# Patient Record
Sex: Female | Born: 1935 | Race: White | Hispanic: No | Smoking: Never smoker
Health system: Southern US, Community
[De-identification: ages and names within clinical notes are randomized; demographics above are authoritative.]

## PROBLEM LIST (undated history)

## (undated) DIAGNOSIS — I219 Acute myocardial infarction, unspecified: Secondary | ICD-10-CM

## (undated) DIAGNOSIS — I10 Essential (primary) hypertension: Secondary | ICD-10-CM

## (undated) DIAGNOSIS — I2510 Atherosclerotic heart disease of native coronary artery without angina pectoris: Secondary | ICD-10-CM

## (undated) DIAGNOSIS — M353 Polymyalgia rheumatica: Secondary | ICD-10-CM

## (undated) DIAGNOSIS — I639 Cerebral infarction, unspecified: Secondary | ICD-10-CM

## (undated) DIAGNOSIS — K589 Irritable bowel syndrome without diarrhea: Secondary | ICD-10-CM

## (undated) DIAGNOSIS — Z8673 Personal history of transient ischemic attack (TIA), and cerebral infarction without residual deficits: Secondary | ICD-10-CM

## (undated) DIAGNOSIS — J984 Other disorders of lung: Secondary | ICD-10-CM

## (undated) DIAGNOSIS — G8929 Other chronic pain: Secondary | ICD-10-CM

## (undated) DIAGNOSIS — M810 Age-related osteoporosis without current pathological fracture: Secondary | ICD-10-CM

## (undated) DIAGNOSIS — N183 Chronic kidney disease, stage 3 unspecified: Secondary | ICD-10-CM

## (undated) DIAGNOSIS — C4490 Unspecified malignant neoplasm of skin, unspecified: Secondary | ICD-10-CM

## (undated) DIAGNOSIS — B029 Zoster without complications: Secondary | ICD-10-CM

## (undated) DIAGNOSIS — I6529 Occlusion and stenosis of unspecified carotid artery: Secondary | ICD-10-CM

## (undated) DIAGNOSIS — D649 Anemia, unspecified: Secondary | ICD-10-CM

## (undated) DIAGNOSIS — M1710 Unilateral primary osteoarthritis, unspecified knee: Secondary | ICD-10-CM

## (undated) DIAGNOSIS — C801 Malignant (primary) neoplasm, unspecified: Secondary | ICD-10-CM

## (undated) DIAGNOSIS — M179 Osteoarthritis of knee, unspecified: Secondary | ICD-10-CM

## (undated) DIAGNOSIS — M549 Dorsalgia, unspecified: Secondary | ICD-10-CM

## (undated) HISTORY — DX: Age-related osteoporosis without current pathological fracture: M81.0

## (undated) HISTORY — PX: KNEE ARTHROSCOPY: SHX127

## (undated) HISTORY — DX: Osteoarthritis of knee, unspecified: M17.9

## (undated) HISTORY — DX: Malignant (primary) neoplasm, unspecified: C80.1

## (undated) HISTORY — PX: KNEE ARTHROPLASTY: SHX992

## (undated) HISTORY — PX: JOINT REPLACEMENT: SHX530

## (undated) HISTORY — DX: Other chronic pain: G89.29

## (undated) HISTORY — DX: Occlusion and stenosis of unspecified carotid artery: I65.29

## (undated) HISTORY — PX: LUMBAR LAMINECTOMY: SHX95

## (undated) HISTORY — DX: Cerebral infarction, unspecified: I63.9

## (undated) HISTORY — PX: CARDIAC CATHETERIZATION: SHX172

## (undated) HISTORY — DX: Personal history of transient ischemic attack (TIA), and cerebral infarction without residual deficits: Z86.73

## (undated) HISTORY — DX: Essential (primary) hypertension: I10

## (undated) HISTORY — DX: Other disorders of lung: J98.4

## (undated) HISTORY — DX: Polymyalgia rheumatica: M35.3

## (undated) HISTORY — DX: Unspecified malignant neoplasm of skin, unspecified: C44.90

## (undated) HISTORY — DX: Dorsalgia, unspecified: M54.9

## (undated) HISTORY — DX: Zoster without complications: B02.9

## (undated) HISTORY — DX: Unilateral primary osteoarthritis, unspecified knee: M17.10

## (undated) HISTORY — DX: Irritable bowel syndrome, unspecified: K58.9

## (undated) HISTORY — PX: OTHER SURGICAL HISTORY: SHX169

## (undated) HISTORY — DX: Atherosclerotic heart disease of native coronary artery without angina pectoris: I25.10

## (undated) HISTORY — DX: Anemia, unspecified: D64.9

---
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
## 1975-04-23 HISTORY — PX: ABDOMINAL HYSTERECTOMY: SHX81

## 1980-04-22 HISTORY — PX: BACK SURGERY: SHX140

## 1999-04-12 ENCOUNTER — Encounter: Payer: Self-pay | Admitting: Orthopedic Surgery

## 1999-04-18 ENCOUNTER — Encounter: Payer: Self-pay | Admitting: Orthopedic Surgery

## 1999-04-18 ENCOUNTER — Inpatient Hospital Stay (HOSPITAL_COMMUNITY): Admission: RE | Admit: 1999-04-18 | Discharge: 1999-04-24 | Payer: Self-pay | Admitting: Orthopedic Surgery

## 1999-04-24 ENCOUNTER — Inpatient Hospital Stay (HOSPITAL_COMMUNITY)
Admission: RE | Admit: 1999-04-24 | Discharge: 1999-04-28 | Payer: Self-pay | Admitting: Physical Medicine & Rehabilitation

## 1999-06-13 ENCOUNTER — Inpatient Hospital Stay (HOSPITAL_COMMUNITY): Admission: RE | Admit: 1999-06-13 | Discharge: 1999-06-17 | Payer: Self-pay | Admitting: Orthopedic Surgery

## 1999-09-06 ENCOUNTER — Encounter: Payer: Self-pay | Admitting: Obstetrics and Gynecology

## 1999-09-06 ENCOUNTER — Encounter: Admission: RE | Admit: 1999-09-06 | Discharge: 1999-09-06 | Payer: Self-pay | Admitting: Obstetrics and Gynecology

## 2000-07-29 ENCOUNTER — Encounter (HOSPITAL_COMMUNITY): Admission: RE | Admit: 2000-07-29 | Discharge: 2000-08-28 | Payer: Self-pay | Admitting: Orthopedic Surgery

## 2000-09-16 ENCOUNTER — Encounter: Payer: Self-pay | Admitting: Obstetrics and Gynecology

## 2000-09-16 ENCOUNTER — Encounter: Admission: RE | Admit: 2000-09-16 | Discharge: 2000-09-16 | Payer: Self-pay | Admitting: Obstetrics and Gynecology

## 2001-10-06 ENCOUNTER — Encounter: Admission: RE | Admit: 2001-10-06 | Discharge: 2001-10-06 | Payer: Self-pay | Admitting: Obstetrics and Gynecology

## 2001-10-06 ENCOUNTER — Encounter: Payer: Self-pay | Admitting: Obstetrics and Gynecology

## 2001-11-26 ENCOUNTER — Inpatient Hospital Stay (HOSPITAL_COMMUNITY): Admission: EM | Admit: 2001-11-26 | Discharge: 2001-11-27 | Payer: Self-pay | Admitting: *Deleted

## 2002-02-01 ENCOUNTER — Encounter: Payer: Self-pay | Admitting: Family Medicine

## 2002-02-01 ENCOUNTER — Ambulatory Visit (HOSPITAL_COMMUNITY): Admission: RE | Admit: 2002-02-01 | Discharge: 2002-02-01 | Payer: Self-pay | Admitting: Family Medicine

## 2002-11-30 ENCOUNTER — Encounter: Payer: Self-pay | Admitting: Obstetrics and Gynecology

## 2002-11-30 ENCOUNTER — Encounter: Admission: RE | Admit: 2002-11-30 | Discharge: 2002-11-30 | Payer: Self-pay | Admitting: Obstetrics and Gynecology

## 2003-04-19 ENCOUNTER — Ambulatory Visit (HOSPITAL_COMMUNITY): Admission: RE | Admit: 2003-04-19 | Discharge: 2003-04-19 | Payer: Self-pay | Admitting: Orthopedic Surgery

## 2003-05-10 ENCOUNTER — Encounter (HOSPITAL_COMMUNITY): Admission: RE | Admit: 2003-05-10 | Discharge: 2003-06-09 | Payer: Self-pay | Admitting: Orthopedic Surgery

## 2003-06-13 ENCOUNTER — Encounter (HOSPITAL_COMMUNITY): Admission: RE | Admit: 2003-06-13 | Discharge: 2003-07-13 | Payer: Self-pay | Admitting: Orthopedic Surgery

## 2003-10-05 ENCOUNTER — Ambulatory Visit (HOSPITAL_COMMUNITY): Admission: RE | Admit: 2003-10-05 | Discharge: 2003-10-05 | Payer: Self-pay | Admitting: Family Medicine

## 2003-12-01 ENCOUNTER — Ambulatory Visit (HOSPITAL_COMMUNITY): Admission: RE | Admit: 2003-12-01 | Discharge: 2003-12-01 | Payer: Self-pay | Admitting: Obstetrics and Gynecology

## 2004-02-10 ENCOUNTER — Encounter (HOSPITAL_COMMUNITY)
Admission: RE | Admit: 2004-02-10 | Discharge: 2004-03-11 | Payer: Self-pay | Admitting: Physical Medicine and Rehabilitation

## 2004-11-22 ENCOUNTER — Ambulatory Visit (HOSPITAL_COMMUNITY): Admission: RE | Admit: 2004-11-22 | Discharge: 2004-11-22 | Payer: Self-pay | Admitting: Family Medicine

## 2004-12-04 ENCOUNTER — Ambulatory Visit (HOSPITAL_COMMUNITY): Admission: RE | Admit: 2004-12-04 | Discharge: 2004-12-04 | Payer: Self-pay | Admitting: Obstetrics and Gynecology

## 2005-04-22 HISTORY — PX: OTHER SURGICAL HISTORY: SHX169

## 2005-07-31 ENCOUNTER — Encounter: Admission: RE | Admit: 2005-07-31 | Discharge: 2005-07-31 | Payer: Self-pay | Admitting: Orthopedic Surgery

## 2005-12-05 ENCOUNTER — Ambulatory Visit (HOSPITAL_COMMUNITY): Admission: RE | Admit: 2005-12-05 | Discharge: 2005-12-05 | Payer: Self-pay | Admitting: Obstetrics and Gynecology

## 2005-12-17 ENCOUNTER — Encounter: Admission: RE | Admit: 2005-12-17 | Discharge: 2005-12-17 | Payer: Self-pay | Admitting: Orthopedic Surgery

## 2006-01-08 ENCOUNTER — Ambulatory Visit: Payer: Self-pay | Admitting: Cardiology

## 2006-01-30 ENCOUNTER — Encounter (HOSPITAL_COMMUNITY): Admission: RE | Admit: 2006-01-30 | Discharge: 2006-03-01 | Payer: Self-pay | Admitting: Cardiology

## 2006-02-05 ENCOUNTER — Ambulatory Visit: Payer: Self-pay | Admitting: Cardiology

## 2006-02-18 ENCOUNTER — Encounter: Admission: RE | Admit: 2006-02-18 | Discharge: 2006-05-19 | Payer: Self-pay | Admitting: Cardiology

## 2006-03-03 ENCOUNTER — Encounter (HOSPITAL_COMMUNITY): Admission: RE | Admit: 2006-03-03 | Discharge: 2006-04-02 | Payer: Self-pay | Admitting: Cardiology

## 2006-04-04 ENCOUNTER — Encounter (HOSPITAL_COMMUNITY): Admission: RE | Admit: 2006-04-04 | Discharge: 2006-04-21 | Payer: Self-pay | Admitting: Cardiology

## 2006-04-16 ENCOUNTER — Ambulatory Visit: Payer: Self-pay | Admitting: Cardiology

## 2006-06-23 ENCOUNTER — Ambulatory Visit: Payer: Self-pay | Admitting: Cardiology

## 2006-06-25 ENCOUNTER — Encounter (HOSPITAL_COMMUNITY): Admission: RE | Admit: 2006-06-25 | Discharge: 2006-07-25 | Payer: Self-pay | Admitting: Cardiology

## 2006-08-27 ENCOUNTER — Encounter: Admission: RE | Admit: 2006-08-27 | Discharge: 2006-08-27 | Payer: Self-pay | Admitting: Orthopedic Surgery

## 2006-09-05 ENCOUNTER — Encounter (HOSPITAL_COMMUNITY): Admission: RE | Admit: 2006-09-05 | Discharge: 2006-10-05 | Payer: Self-pay | Admitting: Cardiology

## 2006-09-05 ENCOUNTER — Ambulatory Visit: Payer: Self-pay | Admitting: Cardiology

## 2006-09-11 ENCOUNTER — Ambulatory Visit: Payer: Self-pay | Admitting: Cardiology

## 2006-12-09 ENCOUNTER — Encounter: Admission: RE | Admit: 2006-12-09 | Discharge: 2006-12-09 | Payer: Self-pay | Admitting: Obstetrics and Gynecology

## 2006-12-23 ENCOUNTER — Ambulatory Visit: Payer: Self-pay | Admitting: Cardiology

## 2007-05-05 ENCOUNTER — Encounter (HOSPITAL_COMMUNITY): Admission: RE | Admit: 2007-05-05 | Discharge: 2007-06-04 | Payer: Self-pay | Admitting: Orthopedic Surgery

## 2007-06-05 ENCOUNTER — Encounter (HOSPITAL_COMMUNITY): Admission: RE | Admit: 2007-06-05 | Discharge: 2007-07-05 | Payer: Self-pay | Admitting: Orthopedic Surgery

## 2007-06-24 ENCOUNTER — Ambulatory Visit: Payer: Self-pay | Admitting: Cardiology

## 2007-08-17 ENCOUNTER — Emergency Department (HOSPITAL_COMMUNITY): Admission: EM | Admit: 2007-08-17 | Discharge: 2007-08-17 | Payer: Self-pay | Admitting: Emergency Medicine

## 2007-12-15 ENCOUNTER — Encounter: Admission: RE | Admit: 2007-12-15 | Discharge: 2007-12-15 | Payer: Self-pay | Admitting: Obstetrics and Gynecology

## 2007-12-23 ENCOUNTER — Ambulatory Visit: Payer: Self-pay | Admitting: Cardiology

## 2008-04-21 ENCOUNTER — Ambulatory Visit: Payer: Self-pay | Admitting: Cardiology

## 2008-04-21 ENCOUNTER — Encounter (INDEPENDENT_AMBULATORY_CARE_PROVIDER_SITE_OTHER): Payer: Self-pay | Admitting: Emergency Medicine

## 2008-04-21 ENCOUNTER — Observation Stay (HOSPITAL_COMMUNITY): Admission: EM | Admit: 2008-04-21 | Discharge: 2008-04-22 | Payer: Self-pay | Admitting: Emergency Medicine

## 2008-04-22 HISTORY — PX: PR VEIN BYPASS GRAFT,AORTO-FEM-POP: 35551

## 2008-05-10 ENCOUNTER — Ambulatory Visit: Payer: Self-pay | Admitting: Vascular Surgery

## 2008-05-13 ENCOUNTER — Encounter: Admission: RE | Admit: 2008-05-13 | Discharge: 2008-05-13 | Payer: Self-pay | Admitting: Interventional Radiology

## 2008-07-11 ENCOUNTER — Ambulatory Visit: Payer: Self-pay | Admitting: Cardiology

## 2008-08-03 ENCOUNTER — Inpatient Hospital Stay (HOSPITAL_COMMUNITY): Admission: RE | Admit: 2008-08-03 | Discharge: 2008-08-08 | Payer: Self-pay | Admitting: Orthopedic Surgery

## 2008-08-05 ENCOUNTER — Ambulatory Visit: Payer: Self-pay | Admitting: Vascular Surgery

## 2008-08-05 ENCOUNTER — Encounter (INDEPENDENT_AMBULATORY_CARE_PROVIDER_SITE_OTHER): Payer: Self-pay | Admitting: Orthopedic Surgery

## 2008-09-06 ENCOUNTER — Encounter (HOSPITAL_COMMUNITY): Admission: RE | Admit: 2008-09-06 | Discharge: 2008-10-06 | Payer: Self-pay | Admitting: Orthopedic Surgery

## 2008-09-28 DIAGNOSIS — M353 Polymyalgia rheumatica: Secondary | ICD-10-CM | POA: Insufficient documentation

## 2008-09-28 DIAGNOSIS — R079 Chest pain, unspecified: Secondary | ICD-10-CM

## 2008-09-28 DIAGNOSIS — I10 Essential (primary) hypertension: Secondary | ICD-10-CM | POA: Insufficient documentation

## 2008-09-28 DIAGNOSIS — J984 Other disorders of lung: Secondary | ICD-10-CM | POA: Insufficient documentation

## 2008-09-28 DIAGNOSIS — M1710 Unilateral primary osteoarthritis, unspecified knee: Secondary | ICD-10-CM

## 2008-09-28 DIAGNOSIS — K589 Irritable bowel syndrome without diarrhea: Secondary | ICD-10-CM

## 2008-10-04 ENCOUNTER — Ambulatory Visit: Payer: Self-pay | Admitting: Cardiology

## 2008-10-14 ENCOUNTER — Encounter (HOSPITAL_COMMUNITY): Admission: RE | Admit: 2008-10-14 | Discharge: 2008-11-13 | Payer: Self-pay | Admitting: Orthopedic Surgery

## 2009-01-10 ENCOUNTER — Encounter: Payer: Self-pay | Admitting: Cardiology

## 2009-01-30 ENCOUNTER — Ambulatory Visit: Payer: Self-pay | Admitting: Cardiology

## 2009-01-30 ENCOUNTER — Observation Stay (HOSPITAL_COMMUNITY): Admission: EM | Admit: 2009-01-30 | Discharge: 2009-01-31 | Payer: Self-pay | Admitting: Emergency Medicine

## 2009-01-30 ENCOUNTER — Telehealth (INDEPENDENT_AMBULATORY_CARE_PROVIDER_SITE_OTHER): Payer: Self-pay | Admitting: *Deleted

## 2009-01-30 LAB — CONVERTED CEMR LAB
ALT: 14 units/L
AST: 19 units/L
Albumin: 3.5 g/dL
Alkaline Phosphatase: 60 units/L
CK-MB: 1.6 ng/mL
CO2: 29 meq/L
Chloride: 107 meq/L
Glucose, Bld: 95 mg/dL
Hemoglobin: 11.8 g/dL
Platelets: 264 10*3/uL
Total Protein: 6.6 g/dL
WBC: 4.8 10*3/uL

## 2009-02-16 ENCOUNTER — Ambulatory Visit: Payer: Self-pay | Admitting: Cardiology

## 2009-05-05 ENCOUNTER — Ambulatory Visit: Payer: Self-pay | Admitting: Vascular Surgery

## 2009-05-05 ENCOUNTER — Encounter: Payer: Self-pay | Admitting: Cardiology

## 2009-07-18 ENCOUNTER — Encounter: Admission: RE | Admit: 2009-07-18 | Discharge: 2009-07-18 | Payer: Self-pay | Admitting: Obstetrics and Gynecology

## 2009-11-07 ENCOUNTER — Ambulatory Visit: Payer: Self-pay | Admitting: Cardiology

## 2009-11-07 DIAGNOSIS — I252 Old myocardial infarction: Secondary | ICD-10-CM | POA: Insufficient documentation

## 2009-11-07 DIAGNOSIS — I2510 Atherosclerotic heart disease of native coronary artery without angina pectoris: Secondary | ICD-10-CM

## 2010-01-26 ENCOUNTER — Telehealth (INDEPENDENT_AMBULATORY_CARE_PROVIDER_SITE_OTHER): Payer: Self-pay

## 2010-04-17 ENCOUNTER — Ambulatory Visit: Payer: Self-pay | Admitting: Cardiology

## 2010-05-22 NOTE — Progress Notes (Signed)
**Note De-Identified Brenda Hall Obfuscation** Summary: Patient having problems  Phone Note Call from Patient   Caller: Patient Reason for Call: Talk to Nurse, Privacy/Consent Authorization Summary of Call: pt states that this AM she had some chest pain / pt got out of bed and stood up and it went away / states she went back to bed and it started to hurt again / states that she got up and took 2 ASA's / states that they relieved pain in about 30 minutes / pt states the BP was checked and it was 90/64 / states she hasn't had any more pain, just wanted to see if she should come in to office /tg Initial call taken by: Raechel Ache [facility],  January 26, 2010 11:32 AM  Follow-up for Phone Call        Brenda Hall states that CP did not radiate, she had no nausea, sweating, sob, etc. Pt. states that her husband had appt. this morning with Dr. Fredrich Birks and she asked if they would check her BP which was 90/64. Pt. states she has had no chest pain since taking 2 tablets of 325mg  ASA @ 0700 and that she did not have to take NTG. Pt. wants to know if she needs appt. to be checked? Please advise. Follow-up by: Larita Fife Keon Pender LPN,  January 26, 2010 1:17 PM     Appended Document: Patient having problems no need for office visit.....may be acid reflux.  Reviewed Juanito Doom, MD  Appended Document: Patient having problems Pt. advised.

## 2010-05-22 NOTE — Letter (Signed)
Summary: [facility]   [facility]   Imported By: Roderic Ovens 06/14/2009 09:42:58  _____________________________________________________________________  External Attachment:    Type:   Image     Comment:   External Document

## 2010-05-22 NOTE — Assessment & Plan Note (Signed)
Summary: 6 mth f/u per checkout on 02/16/09/tg   Primary Provider:  Dr.Stephen Gerda Diss   History of Present Illness: Mrs Burling returns for E and M of her history of CAD.  Since her admission in October 2010, she has had no recurrent CP. Her major c/o is she is taking too many pills. I have reviewed these today with her and advised stopping her B12, MVit, Zinc. She is taking both Aggrenox and ASA but complains of easy bruising. She would like to stop the ASA.  No complaint of angina, DOE or other anginal equivalent.  Current Medications (verified): 1)  Lisinopril 10 Mg Tabs (Lisinopril) .... Take One Tablet By Mouth Daily 2)  Metoprolol Tartrate 25 Mg Tabs (Metoprolol Tartrate) .Marland Kitchen.. 1 By Mouth Two Times A Day 3)  Celebrex 200 Mg Caps (Celecoxib) .Marland Kitchen.. 1 Tablet Daily 4)  Detrol La 4 Mg Xr24h-Cap (Tolterodine Tartrate) 5)  Calcium-Vitamin D 600-200 Mg-Unit Tabs (Calcium-Vitamin D) .Marland Kitchen.. 1 Tablet Daily 6)  Glucosamine-Chondroitin  Caps (Glucosamine-Chondroit-Vit C-Mn) .Marland Kitchen.. 1 Tablet Daily 7)  Aggrenox 25-200 Mg Xr12h-Cap (Aspirin-Dipyridamole) .... Take One Capsule By Mouth Twice A Day 8)  Fosamax 70 Mg Tabs (Alendronate Sodium) .Marland Kitchen.. 1 Tablet Wkly 9)  Omeprazole 20 Mg Tbec (Omeprazole) .... Daily As Needed 10)  Flonase 50 Mcg/act Susp (Fluticasone Propionate) .... Daily As Needed 11)  Lipitor 40 Mg Tabs (Atorvastatin Calcium) .... Take One Tablet By Mouth Daily. 12)  Pantoprazole Sodium 40 Mg Tbec (Pantoprazole Sodium) .... Take 1 Tab Daily 13)  Premarin 0.3 Mg Tabs (Estrogens Conjugated) .Marland Kitchen.. 1 By Mouth Daily As Directed 14)  Lomotil 2.5-0.025 Mg Tabs (Diphenoxylate-Atropine) .... As Needed 15)  Vitamin D 2000 Unit Tabs (Cholecalciferol) .Marland Kitchen.. 1 Tablet By Mouth Once Daily  Allergies (verified): 1)  ! Plavix (Clopidogrel Bisulfate)  Review of Systems       negative other than HPI.  Vital Signs:  Patient profile:   77 year old female Height:      65 inches Weight:      175  pounds BMI:     29.23 Pulse rate:   79 / minute Resp:     16 per minute BP sitting:   108 / 62  (left arm)  Vitals Entered By: Marrion Coy, CNA (November 07, 2009 1:32 PM)  Physical Exam  General:  obese.   Head:  normocephalic and atraumatic Eyes:  GLASSES OTHERWISE UNREMARKABLE Neck:  Neck supple, no JVD. No masses, thyromegaly or abnormal cervical nodes. Chest Aleksandra Raben:  no deformities or breast masses noted Lungs:  Clear bilaterally to auscultation and percussion. Heart:  Non-displaced PMI, chest non-tender; regular rate and rhythm, S1, S2 without murmurs, rubs or gallops. Carotid upstroke normal, no bruit. Normal abdominal aortic size, no bruits. Femorals normal pulses, no bruits. Pedals normal pulses. No edema, no varicosities. Abdomen:  Bowel sounds positive; abdomen soft and non-tender without masses, organomegaly, or hernias noted. No hepatosplenomegaly. Msk:  decreased ROM.   Pulses:  NO DP in R foot, otherwise normal. Extremities:  No clubbing or cyanosis. Neurologic:  Alert and oriented x 3. Skin:  Intact without lesions or rashes. Psych:  Normal affect.   Problems:  Medical Problems Added: 1)  Dx of Old Myocardial Infarction  (ICD-412) 2)  Dx of Cad, Native Vessel  (ICD-414.01)  EKG  Procedure date:  11/07/2009  Findings:      NSR, Nl EKG  Impression & Recommendations:  Problem # 1:  CAD, NATIVE VESSEL (ICD-414.01) Assessment Unchanged With increased bruising will stop  asa. Continue Aggrenox. The following medications were removed from the medication list:    Aspir-low 81 Mg Tbec (Aspirin) .Marland Kitchen... Take 1 tab daily Her updated medication list for this problem includes:    Lisinopril 10 Mg Tabs (Lisinopril) .Marland Kitchen... Take one tablet by mouth daily    Metoprolol Tartrate 25 Mg Tabs (Metoprolol tartrate) .Marland Kitchen... 1 by mouth two times a day    Aggrenox 25-200 Mg Xr12h-cap (Aspirin-dipyridamole) .Marland Kitchen... Take one capsule by mouth twice a day  Problem # 2:  HYPERTENSION  (ICD-401.9) Assessment: Improved  The following medications were removed from the medication list:    Aspir-low 81 Mg Tbec (Aspirin) .Marland Kitchen... Take 1 tab daily Her updated medication list for this problem includes:    Lisinopril 10 Mg Tabs (Lisinopril) .Marland Kitchen... Take one tablet by mouth daily    Metoprolol Tartrate 25 Mg Tabs (Metoprolol tartrate) .Marland Kitchen... 1 by mouth two times a day  Problem # 3:  OLD MYOCARDIAL INFARCTION (ICD-412) Assessment: Unchanged  The following medications were removed from the medication list:    Aspir-low 81 Mg Tbec (Aspirin) .Marland Kitchen... Take 1 tab daily Her updated medication list for this problem includes:    Lisinopril 10 Mg Tabs (Lisinopril) .Marland Kitchen... Take one tablet by mouth daily    Metoprolol Tartrate 25 Mg Tabs (Metoprolol tartrate) .Marland Kitchen... 1 by mouth two times a day    Aggrenox 25-200 Mg Xr12h-cap (Aspirin-dipyridamole) .Marland Kitchen... Take one capsule by mouth twice a day  Patient Instructions: 1)  Your physician recommends that you schedule a follow-up appointment in: 6 months 2)  Your physician has recommended you make the following change in your medication: Stop taking Aspirin, Vitamin B-12, Zink and Multiple Vitamin.

## 2010-05-24 NOTE — Assessment & Plan Note (Signed)
Summary: 6 mth fu /sn   Visit Type:  Follow-up Primary Provider:  Dr.Stephen Gerda Diss  CC:  no cardiology complaints.  History of Present Illness: Brenda Hall Nine times a day for followup of her coronary artery disease.  She's having no angina or ischemic symptoms. She's been having a lot of problems with her niece and arthritic pain.  Because of a problem with her kidneys, she had come off of Celebrex. She is really now having a lot of problems getting around.  She is very compliant with her medications. She will to go back on aspirin full time. She thinks it helps her arthritis and also her nondescriptive chest pain. She remains on Aggrenox. She wanted to stop at last visit because of easy bruisability.  She denies any reflux symptoms, hemoptysis or hematemesis, or melena.  She is having no symptoms of TIAs or mini strokes. She is nonobstructive carotid disease.  Clinical Reports Reviewed:  Carotid Doppler:  04/21/2008:  IMPRESSION:    Plaque formation bilaterally at carotid bifurcations, greater on   left.   Mildly elevated peak systolic velocities in the proximal ICA   bilaterally, both falling within the 50-69% diameter stenosis   range, though by gray scale imaging and color Doppler imaging the   plaque at the proximal right ICA appears only minimal in severity   and doubt this degree of stenosis.   Proximal right ECA stenosis present, however.   Current Medications (verified): 1)  Lisinopril 10 Mg Tabs (Lisinopril) .... Take One Tablet By Mouth Daily 2)  Metoprolol Tartrate 25 Mg Tabs (Metoprolol Tartrate) .Marland Kitchen.. 1 By Mouth Two Times A Day 3)  Detrol La 4 Mg Xr24h-Cap (Tolterodine Tartrate) .... Take 1 Tab Daily 4)  Calcium-Vitamin D 600-200 Mg-Unit Tabs (Calcium-Vitamin D) .Marland Kitchen.. 1 Tablet Daily 5)  Glucosamine-Chondroitin  Caps (Glucosamine-Chondroit-Vit C-Mn) .Marland Kitchen.. 1 Tablet Daily 6)  Aggrenox 25-200 Mg Xr12h-Cap (Aspirin-Dipyridamole) .... Take One Capsule By Mouth Twice  A Day 7)  Fosamax 70 Mg Tabs (Alendronate Sodium) .Marland Kitchen.. 1 Tablet Wkly 8)  Flonase 50 Mcg/act Susp (Fluticasone Propionate) .... Daily As Needed 9)  Lipitor 40 Mg Tabs (Atorvastatin Calcium) .... Take One Tablet By Mouth Daily. 10)  Pantoprazole Sodium 40 Mg Tbec (Pantoprazole Sodium) .... Take 1 Tab Daily 11)  Premarin 0.3 Mg Tabs (Estrogens Conjugated) .Marland Kitchen.. 1 By Mouth Daily As Directed 12)  Lomotil 2.5-0.025 Mg Tabs (Diphenoxylate-Atropine) .... As Needed 13)  Vitamin D 2000 Unit Tabs (Cholecalciferol) .Marland Kitchen.. 1 Tablet By Mouth Once Daily 14)  Artro Silium .... Take 1 Tab Daily 15)  Aspirin 325 Mg Tabs (Aspirin) .... Take 1 Tab Daily  Allergies (verified): 1)  ! Plavix (Clopidogrel Bisulfate)  Comments:  Nurse/Medical Assistant: patient states she is to see Dr.Tananbaum next week per Dr.Devashar due to high level in kidney doesn't remember what kind of level patient uses Tourist information centre manager.  Past History:  Past Medical History: Last updated: 10-19-2008 Current Problems:  CAD (ICD-414.00) LUNG NODULE (ICD-518.89) DEGENERATIVE JOINT DISEASE, KNEE (ICD-715.96) IRRITABLE BOWEL SYNDROME (ICD-564.1) POLYMYALGIA RHEUMATICA (ICD-725) HYPERTENSION (ICD-401.9) CHEST PAIN UNSPECIFIED (ICD-786.50)  Past Surgical History: Last updated: 19-Oct-2008 Lumbar Laminectomy removal of Melanoma from back 2007 Knee Arthroscopy Knee Arthroplasty-Total  Family History: Last updated: 2008-10-19 Father:deceased 65 yo [place] Mother:DECEASED 26 yo uknw Siblings:sister 1 living   Social History: Last updated: 10-19-08 Retired  Married  children:3 Regular Exercise - yes hobbies swimming Tobacco Use - No.  Alcohol Use - no Drug Use - no  Risk Factors: Exercise: yes (10-19-08)  Risk Factors: Smoking Status: never (09/28/2008)  Review of Systems       negative other than history of present illness  Vital Signs:  Patient profile:   77 year old female Weight:      174 pounds BMI:      29.06 O2 Sat:      98 % on Room air Pulse rate:   65 / minute BP sitting:   149 / 68  (right arm)  Vitals Entered By: Dreama Saa, CNA (April 17, 2010 2:36 PM)  O2 Flow:  Room air  Physical Exam  General:  no acute distress,obese.   Head:  normocephalic and atraumatic Eyes:  PERRLA/EOM intact; conjunctiva and lids normal. Neck:  Neck supple, no JVD. No masses, thyromegaly or abnormal cervical nodes. Chest Wall:  no deformities or breast masses noted Lungs:  Clear bilaterally to auscultation and percussion. Heart:  PMI nondisplaced, regular rate and rhythm, normal S1-S2, no obvious carotid bruits Abdomen:  soft, good bowel sounds, no tenderness Msk:  decreased ROM.   Pulses:  pulses normal in all 4 extremities Extremities:  No clubbing or cyanosis. Neurologic:  Alert and oriented x 3. Skin:  Intact without lesions or rashes. Psych:  Normal affect.   Impression & Recommendations:  Problem # 1:  CAD, NATIVE VESSEL (ICD-414.01) Assessment Unchanged  Her updated medication list for this problem includes:    Lisinopril 10 Mg Tabs (Lisinopril) .Marland Kitchen... Take one tablet by mouth daily    Metoprolol Tartrate 25 Mg Tabs (Metoprolol tartrate) .Marland Kitchen... 1 by mouth two times a day    Aggrenox 25-200 Mg Xr12h-cap (Aspirin-dipyridamole) .Marland Kitchen... Take one capsule by mouth twice a day    Aspirin 325 Mg Tabs (Aspirin) .Marland Kitchen... Take 1 tab daily  Problem # 2:  OLD MYOCARDIAL INFARCTION (ICD-412) Assessment: Unchanged  Her updated medication list for this problem includes:    Lisinopril 10 Mg Tabs (Lisinopril) .Marland Kitchen... Take one tablet by mouth daily    Metoprolol Tartrate 25 Mg Tabs (Metoprolol tartrate) .Marland Kitchen... 1 by mouth two times a day    Aggrenox 25-200 Mg Xr12h-cap (Aspirin-dipyridamole) .Marland Kitchen... Take one capsule by mouth twice a day    Aspirin 325 Mg Tabs (Aspirin) .Marland Kitchen... Take 1 tab daily  Problem # 3:  HYPERTENSION (ICD-401.9) Assessment: Improved  Her updated medication list for this problem  includes:    Lisinopril 10 Mg Tabs (Lisinopril) .Marland Kitchen... Take one tablet by mouth daily    Metoprolol Tartrate 25 Mg Tabs (Metoprolol tartrate) .Marland Kitchen... 1 by mouth two times a day    Aspirin 325 Mg Tabs (Aspirin) .Marland Kitchen... Take 1 tab daily  Problem # 4:  CHEST PAIN UNSPECIFIED (ICD-786.50) Assessment: Unchanged I reassured her this is noncardiac. No further workup at this time. Her updated medication list for this problem includes:    Lisinopril 10 Mg Tabs (Lisinopril) .Marland Kitchen... Take one tablet by mouth daily    Metoprolol Tartrate 25 Mg Tabs (Metoprolol tartrate) .Marland Kitchen... 1 by mouth two times a day    Aggrenox 25-200 Mg Xr12h-cap (Aspirin-dipyridamole) .Marland Kitchen... Take one capsule by mouth twice a day    Aspirin 325 Mg Tabs (Aspirin) .Marland Kitchen... Take 1 tab daily  Patient Instructions: 1)  Your physician recommends that you schedule a follow-up appointment in: 6 months 2)  Your physician recommends that you continue on your current medications as directed. Please refer to the Current Medication list given to you today.

## 2010-07-26 LAB — CBC
MCV: 94.8 fL (ref 78.0–100.0)
RBC: 3.65 MIL/uL — ABNORMAL LOW (ref 3.87–5.11)
RDW: 12.7 % (ref 11.5–15.5)
WBC: 4.8 10*3/uL (ref 4.0–10.5)

## 2010-07-26 LAB — COMPREHENSIVE METABOLIC PANEL
AST: 19 U/L (ref 0–37)
Albumin: 3.5 g/dL (ref 3.5–5.2)
Alkaline Phosphatase: 60 U/L (ref 39–117)
Chloride: 107 mEq/L (ref 96–112)
GFR calc Af Amer: >60 mL/min (ref >60)

## 2010-07-26 LAB — DIFFERENTIAL
Basophils Absolute: 0 10*3/uL (ref 0.0–0.1)
Basophils Relative: 1 % (ref 0–1)
Eosinophils Absolute: 0.2 10*3/uL (ref 0.0–0.7)
Eosinophils Relative: 5 % (ref 0–5)
Lymphocytes Relative: 38 % (ref 12–46)
Lymphs Abs: 1.8 10*3/uL (ref 0.7–4.0)
Neutro Abs: 2.3 10*3/uL (ref 1.7–7.7)

## 2010-07-26 LAB — POCT CARDIAC MARKERS
CKMB, poc: 1.3 ng/mL (ref 1.0–8.0)
CKMB, poc: <1 ng/mL — ABNORMAL LOW (ref 1.0–8.0)
Myoglobin, poc: 85.4 ng/mL (ref 12–200)
Troponin i, poc: <0.05 ng/mL (ref 0.00–0.09)

## 2010-07-26 LAB — CARDIAC PANEL(CRET KIN+CKTOT+MB+TROPI)
Relative Index: INVALID (ref 0.0–2.5)
Total CK: 61 U/L (ref 7–177)
Troponin I: 0.02 ng/mL (ref 0.00–0.06)

## 2010-08-01 LAB — DIFFERENTIAL
Basophils Absolute: 0 10*3/uL (ref 0.0–0.1)
Basophils Relative: 1 % (ref 0–1)
Eosinophils Relative: 2 % (ref 0–5)
Lymphocytes Relative: 33 % (ref 12–46)
Monocytes Absolute: 0.4 10*3/uL (ref 0.1–1.0)
Neutro Abs: 3.4 10*3/uL (ref 1.7–7.7)

## 2010-08-01 LAB — CBC
HCT: 25.2 % — ABNORMAL LOW (ref 36.0–46.0)
HCT: 30.7 % — ABNORMAL LOW (ref 36.0–46.0)
HCT: 36 % (ref 36.0–46.0)
Hemoglobin: 8.8 g/dL — ABNORMAL LOW (ref 12.0–15.0)
Hemoglobin: 10 g/dL — ABNORMAL LOW (ref 12.0–15.0)
Hemoglobin: 12.3 g/dL (ref 12.0–15.0)
MCHC: 34 g/dL (ref 30.0–36.0)
MCHC: 34.8 g/dL (ref 30.0–36.0)
MCHC: 35 g/dL (ref 30.0–36.0)
MCHC: 34.1 g/dL (ref 30.0–36.0)
MCV: 93.6 fL (ref 78.0–100.0)
MCV: 94.3 fL (ref 78.0–100.0)
MCV: 94.9 fL (ref 78.0–100.0)
MCV: 95.1 fL (ref 78.0–100.0)
Platelets: 207 10*3/uL (ref 150–400)
Platelets: 227 10*3/uL (ref 150–400)
Platelets: 246 10*3/uL (ref 150–400)
RBC: 2.67 MIL/uL — ABNORMAL LOW (ref 3.87–5.11)
RBC: 3.26 MIL/uL — ABNORMAL LOW (ref 3.87–5.11)
RBC: 3.79 MIL/uL — ABNORMAL LOW (ref 3.87–5.11)
RDW: 13.3 % (ref 11.5–15.5)
RDW: 13.9 % (ref 11.5–15.5)
RDW: 13.5 % (ref 11.5–15.5)
WBC: 6 10*3/uL (ref 4.0–10.5)
WBC: 7 10*3/uL (ref 4.0–10.5)
WBC: 6 10*3/uL (ref 4.0–10.5)

## 2010-08-01 LAB — BASIC METABOLIC PANEL
BUN: 13 mg/dL (ref 6–23)
BUN: 14 mg/dL (ref 6–23)
BUN: 13 mg/dL (ref 6–23)
CO2: 26 mEq/L (ref 19–32)
CO2: 27 mEq/L (ref 19–32)
CO2: 28 mEq/L (ref 19–32)
Calcium: 7.8 mg/dL — ABNORMAL LOW (ref 8.4–10.5)
Calcium: 8.6 mg/dL (ref 8.4–10.5)
Calcium: 8.7 mg/dL (ref 8.4–10.5)
Chloride: 100 mEq/L (ref 96–112)
Chloride: 105 mEq/L (ref 96–112)
Chloride: 108 mEq/L (ref 96–112)
Creatinine, Ser: 1.17 mg/dL (ref 0.4–1.2)
Creatinine, Ser: 1.19 mg/dL (ref 0.4–1.2)
Creatinine, Ser: 1.31 mg/dL — ABNORMAL HIGH (ref 0.4–1.2)
Creatinine, Ser: 0.95 mg/dL (ref 0.4–1.2)
GFR calc Af Amer: 55 mL/min — ABNORMAL LOW (ref >60)
GFR calc Af Amer: 58 mL/min — ABNORMAL LOW (ref >60)
GFR calc non Af Amer: 45 mL/min — ABNORMAL LOW (ref >60)
GFR calc non Af Amer: 57 mL/min — ABNORMAL LOW (ref >60)
Glucose, Bld: 113 mg/dL — ABNORMAL HIGH (ref 70–99)
Glucose, Bld: 95 mg/dL (ref 70–99)
Glucose, Bld: 146 mg/dL — ABNORMAL HIGH (ref 70–99)
Potassium: 3.9 mEq/L (ref 3.5–5.1)
Sodium: 135 mEq/L (ref 135–145)

## 2010-08-01 LAB — CROSSMATCH
ABO/RH(D): AB POS
Antibody Screen: NEGATIVE

## 2010-08-01 LAB — TYPE AND SCREEN
ABO/RH(D): AB POS
Antibody Screen: NEGATIVE

## 2010-08-01 LAB — URINE CULTURE: Colony Count: 3000

## 2010-08-01 LAB — APTT: aPTT: 25 seconds (ref 24–37)

## 2010-08-01 LAB — URINALYSIS, ROUTINE W REFLEX MICROSCOPIC
Bilirubin Urine: NEGATIVE
Glucose, UA: NEGATIVE mg/dL
Ketones, ur: NEGATIVE mg/dL
Protein, ur: NEGATIVE mg/dL
pH: 5 (ref 5.0–8.0)

## 2010-08-01 LAB — COMPREHENSIVE METABOLIC PANEL
Albumin: 3.7 g/dL (ref 3.5–5.2)
Alkaline Phosphatase: 83 U/L (ref 39–117)
BUN: 22 mg/dL (ref 6–23)
CO2: 26 mEq/L (ref 19–32)
Chloride: 109 mEq/L (ref 96–112)
Creatinine, Ser: 1.12 mg/dL (ref 0.4–1.2)
GFR calc non Af Amer: 47 mL/min — ABNORMAL LOW (ref >60)
Potassium: 4.6 mEq/L (ref 3.5–5.1)
Total Bilirubin: 0.7 mg/dL (ref 0.3–1.2)

## 2010-08-01 LAB — PROTIME-INR: Prothrombin Time: 13.8 seconds (ref 11.6–15.2)

## 2010-08-06 LAB — CBC
Hemoglobin: 12.5 g/dL (ref 12.0–15.0)
RDW: 13.5 % (ref 11.5–15.5)
WBC: 5.7 10*3/uL (ref 4.0–10.5)

## 2010-08-06 LAB — BASIC METABOLIC PANEL
Calcium: 8.8 mg/dL (ref 8.4–10.5)
GFR calc Af Amer: >60 mL/min (ref >60)
GFR calc non Af Amer: 57 mL/min — ABNORMAL LOW (ref >60)
Glucose, Bld: 97 mg/dL (ref 70–99)
Sodium: 140 mEq/L (ref 135–145)

## 2010-08-06 LAB — DIFFERENTIAL
Eosinophils Absolute: 0.1 10*3/uL (ref 0.0–0.7)
Eosinophils Relative: 2 % (ref 0–5)
Lymphocytes Relative: 46 % (ref 12–46)
Lymphs Abs: 2.6 10*3/uL (ref 0.7–4.0)
Monocytes Relative: 9 % (ref 3–12)

## 2010-08-10 ENCOUNTER — Other Ambulatory Visit: Payer: Self-pay

## 2010-08-10 MED ORDER — ASPIRIN-DIPYRIDAMOLE ER 25-200 MG PO CP12: 1.0000 | ORAL_CAPSULE | Freq: Two times a day (BID) | ORAL | Status: DC

## 2010-08-17 ENCOUNTER — Other Ambulatory Visit: Payer: Self-pay | Admitting: Obstetrics and Gynecology

## 2010-08-17 DIAGNOSIS — Z1231 Encounter for screening mammogram for malignant neoplasm of breast: Secondary | ICD-10-CM

## 2010-08-23 ENCOUNTER — Other Ambulatory Visit: Payer: Self-pay | Admitting: Family Medicine

## 2010-08-23 ENCOUNTER — Ambulatory Visit
Admission: RE | Admit: 2010-08-23 | Discharge: 2010-08-23 | Disposition: A | Discharge status: Home / Self Care | Payer: 59 | Source: Ambulatory Visit | Attending: Obstetrics and Gynecology | Admitting: Obstetrics and Gynecology

## 2010-08-23 DIAGNOSIS — Z1231 Encounter for screening mammogram for malignant neoplasm of breast: Secondary | ICD-10-CM

## 2010-09-04 NOTE — Procedures (Signed)
CAROTID DUPLEX EXAM   INDICATION:  Followup of carotid disease.   HISTORY:  Diabetes:  Cardiac:  Yes.  Hypertension:  Yes.  Smoking:  No.  Previous Surgery:  No.  CV History:  Asymptomatic.  Amaurosis Fugax No, Paresthesias No, Hemiparesis No                                       RIGHT             LEFT  Brachial systolic pressure:         Not taken due to melanoma           122  Brachial Doppler waveforms:                           Triphasic  Vertebral direction of flow:        Antegrade         Antegrade  DUPLEX VELOCITIES (cm/sec)  CCA peak systolic                   119               78  ECA peak systolic                   186               76  ICA peak systolic                   80                92  ICA end diastolic                   28                33  PLAQUE MORPHOLOGY:                  Mixed             Intimal  thickening, mixed  PLAQUE AMOUNT:                      Mild              Mild  PLAQUE LOCATION:                    CCA, bifurcation  CCA, bifurcation   IMPRESSION:  1. Right common carotid artery stenosis.  2. Right external carotid artery stenosis.  3. Mild plaque noted bilaterally.   ___________________________________________  Larina Earthly, M.D.   CJ/MEDQ  D:  05/05/2009  T:  05/05/2009  Job:  [id number]

## 2010-09-04 NOTE — Letter (Signed)
Sep 11, 2006    W. Simone Curia, M.D.  [address]  [place], [place] [address]   RE:  Brenda, Hall  MRN:  [id number]  /  DOB:  02-11-1931   Dear Brett Canales:   Ms. Brenda Hall returns to the office following a recent assessment for chest  discomfort.  Fortunately, all testing was negative.  Her cardiac markers  were normal.  Her CBC and TSH were normal.  A stress nuclear study was  negative for ischemia.   She has been taking omeprazole 20 mg daily and has refrained from using  Celebrex.  This has resulted in resolution of her symptoms.   EXAMINATION:  Pleasant, loquacious woman.  The weight is 167, four  pounds more than in March.  Blood pressure 130/70, heart rate 65 and  regular, respirations 16.  NECK:  No jugular venous distention; no carotid bruits.  LUNGS:  Clear.  CARDIAC:  Normal first and second heart sounds; fourth heart sound  present.  ABDOMEN:  Soft and nontender; no organomegaly.   IMPRESSION:  Ms. Brenda Hall is doing well with modification of her medical  therapy.  She was advised that if she needs Celebrex she can take it,  but to use it only on a p.r.n. basis.  She will likewise use omeprazole  on a p.r.n. basis.  She will plan to return to see Dr. Daleen Squibb as  previously scheduled for routine followup.    Sincerely,      Gerrit Friends. Dietrich Pates, MD, [facility]  Electronically Signed    RMR/MedQ  DD: 09/11/2006  DT: 09/11/2006  Job #: [id number]

## 2010-09-04 NOTE — Assessment & Plan Note (Signed)
[facility]                            CARDIOLOGY OFFICE NOTE   NAME:Brenda Hall                  MRN:          [id number]  DATE:06/24/2007                            DOB:          March 04, 1931    Brenda Hall returns today for further management.   PROBLEM LIST:  1. Coronary disease.  She is status post non-Q-wave infarction on      December 30, 2005.  She has a bare metal stent in the circumflex      with residual disease in LAD and right coronary artery.  She has an      EF of 52% with mild apical hypokinesia.  Her last stress Myoview      was in the summer of 2008, which showed EF of 65% with no evidence      of ischemia.  2. Gastroesophageal reflux.  This seems to be worse with taking      Lipitor at night.  I told her to take it earlier in the day.  3. Hyperlipidemia.   She is doing well overall.  She has no complaints of angina or ischemic  symptoms.   CURRENT MEDICATIONS:  1. Lisinopril 10 mg a day.  2. Lipitor 40 mg a day.  3. Premarin 0.0625 daily.  4. Os-Cal, Zinc, B12, B6.  5. Metoprolol 25 mg a day.  6. Aspirin 325 mg a day.  7. Celebrex 200 mg a daily.  8. Detrol daily.   PHYSICAL EXAMINATION:  GENERAL:  She looks younger than stated age,  alert and oriented x3.  Skin is warm and dry.  VITAL SIGNS:  Blood pressure 129/82, pulse 75 and regular, weight is  176.  HEENT:  Normocephalic, atraumatic.  PERRL.  Extraocular is intact.  Sclerae are clear.  Facial symmetry is normal.  NECK:  Carotid upstrokes are equal bilaterally without bruits, no JVD.  Thyroid is not enlarged.  Trachea midline.  LUNGS:  Clear.  HEART:  A nondisplaced PMI.  Normal S1 and S2.  No murmur.  ABDOMEN:  Soft, good bowel sounds.  No midline bruit, no hepatomegaly.  EXTREMITIES:  No cyanosis, clubbing or edema.  Pulses are intact.  NEUROLOGIC:  Intact.   ASSESSMENT/PLAN:  Brenda Hall is doing well.  I have asked her to  decrease her  aspirin to 81 mg a day per her request.  She will take her  Lipitor early in the day to avoid reflux symptoms.  Will plan on seeing  her back in 6 months.     Thomas C. Daleen Squibb, MD, [facility]  Electronically Signed    TCW/MedQ  DD: 06/24/2007  DT: 06/25/2007  Job #: [id number]   cc:   Brenda Hall, M.D.

## 2010-09-04 NOTE — Op Note (Signed)
NAME:  Brenda Hall, Brenda Hall           ACCOUNT NO.:  [id number]   MEDICAL RECORD NO.:  [id number]          PATIENT TYPE:  INP   LOCATION:  5013                         FACILITY:  [facility]   PHYSICIAN:  Dyke Brackett, M.D.    DATE OF BIRTH:  [birth year]   DATE OF PROCEDURE:  DATE OF DISCHARGE:  03/25/2008                               OPERATIVE REPORT   PREOPERATIVE DIAGNOSIS:  Osteoarthritis, left knee.   POSTOPERATIVE DIAGNOSIS:  Osteoarthritis, left knee.   OPERATION:  Left total knee replacement (LCS DePuy standard femur, size  3 tibia, 50-mm bearing size standard patella).   SURGEON:  Dyke Brackett, M.D.   ANESTHESIA:  General.   TOURNIQUET TIME:  1 hour 10 minutes.   DESCRIPTION OF PROCEDURE:  Sterile prep and drape, exsanguination of  legs, placed in 375.  A straight skin incision made and a medial  parapatellar approach to the knee made.  Identification of the most  diseased lateral compartment was made with cutting 2.5 mm below.  This  was followed by an anterior and posterior femoral cut.  Flexion gap  measured at 15 mm with equalization of extension gap with a 4-degree  distal valgus cut of 15 mm in extension as well.  Anterior chamfer cuts  were made, keel hole for the prosthesis followed by preparation of the  tibia with a keel hole cut for a size 3 tibia.  Trial tibia was placed  with a trial femur.  Patella was cut leaving 13 to 14 mm of native  patella with three peg patella placement, reduction carried out trialed  with a 12.5 and 15 mm.  A 50-mm bearing was anatomically aligned and no  undue tension, full extension and good balance of ligaments.  Trial  components removed.  Bony surfaces were irrigated.  Final components  were inserted.  Two batches of cement were made.  Antibiotic  premedicines with a 1.8 g of tobramycin and 1 g of vancomycin total for  two batches.  This was mixed and then the tibia was fixed followed by  the femur and the trial bearing again  placed.  Cement was allowed to  harden.  Trial bearing was removed.  Excess cement was removed from the  knee.  Tourniquet was released.  No undue bleeding was noted.  Closure  was effected with #1 Ethilon.  Hemovac drain was placed exiting  superolaterally, subcutaneous tissues with 2-0 Vicryl.  Taken to  recovery room in stable condition after skin was closed using staple  device.      Dyke Brackett, M.D.  Electronically Signed     WDC/MEDQ  D:  08/03/2008  T:  08/04/2008  Job:  [id number]

## 2010-09-04 NOTE — Consult Note (Signed)
NEW PATIENT CONSULTATION   Brenda Hall, Brenda Hall  DOB:  [birth year]                                       05/10/2008  ZOXWR#:[id number]   The patient is referred for vascular surgery consultation regarding a  recent left brain stroke and possible carotid occlusive disease.  This  77 year old female on January 1 developed heaviness and weakness in her  right arm followed by her right leg with no facial weakness, speech  problems or visual problems.  She was admitted to [facility]  and MRI scan revealed a left posterior frontal deep white matter stroke.  Carotid duplex study was performed which revealed moderate bilateral  carotid occlusive disease and quoted as 60-70% bilaterally.  She had no  history of previous stroke, TIAs, amaurosis fugax, diplopia, blurred  vision or syncope.  She has had almost complete return of function of  her right side stating it is probably 95% normal, some very mild  coordination problems.   PAST MEDICAL HISTORY:  1. Hypertension.  2. Coronary artery disease.  3. Previous metal stent placed in [place], [place], September      2008 following a myocardial infarction.  4. Negative for COPD, congestive heart failure.   PAST SURGICAL HISTORY:  1. Lumbar laminectomy x2.  2. Right knee replacement plus arthroscopy right knee.  3. Arthroscopy of the left knee x2.  4. Removal of melanoma from her back in 2007.   FAMILY HISTORY:  Positive for coronary artery disease.  Father died age  2 from myocardial infarction.  Negative for diabetes and stroke.   SOCIAL HISTORY:  She is married, has three children and is retired.  She  does not use tobacco.  Drinks occasional alcohol.   REVIEW OF SYSTEMS:  Positive for weight gain, urinary frequency,  arthritis, diffuse joint pain, otherwise unremarkable.   ALLERGIES:  Plavix which causes a rash and epinephrine.   MEDICATIONS:  Please see health history form.   PHYSICAL  EXAMINATION:  Vital signs:  Blood pressure is 133/78, heart  rate 79, respirations 14.  General:  She is a healthy-appearing female  in no apparent distress, alert and oriented x3.  Neck:  Supple, 3+  carotid pulses are palpable.  No bruits are audible.  Neurologic:  Exam  is grossly normal.  Chest:  Clear to auscultation.  Cardiovascular:  Regular rhythm.  No murmurs.  Abdomen:  Soft, nontender with no palpable  masses.  3+ femoral, popliteal and dorsalis pedis pulses palpable  bilaterally.   Carotid duplex exam was repeated in the [facility] office today 05/10/2008.  There is mild plaque formation bilaterally which appears quite smooth  and the flow reduction is only approximately 30%.  There is some right  external carotid stenosis.   Although one would suspect that the left carotid is the most likely site  for her recent stroke she has very minimal carotid occlusive disease on  our duplex scan.  I would not recommend left carotid surgery at this  time unless she has further left brain events either transient or  permanent.  She should continue to take either Aggrenox or aspirin daily  as she is doing.  We will see her in 12 months with a followup carotid  duplex exam in our office unless she has any symptoms in the interim.   Quita Skye Hart Rochester, M.D.  Electronically Signed   JDL/MEDQ  D:  05/10/2008  T:  05/11/2008  Job:  [id number]   cc:   Donna Bernard, M.D.  Thomas C. Wall, MD, [facility]

## 2010-09-04 NOTE — Assessment & Plan Note (Signed)
[facility]                       [place] CARDIOLOGY OFFICE NOTE   NAME:Brenda Hall                  MRN:          [id number]  DATE:10/04/2008                            DOB:          [birth year]    Brenda Hall returns today for further management of her coronary  artery disease.   She is status post total left knee replacement by Dr. Madelon Lips in  [place].  She is about 8 weeks out from surgery.  She apparently  needed 2 units of blood.  Postop, her blood pressure was a little bit  low.  Lately has been running about 120 over 60-70.  She is slowly  getting her energy and stamina back.   She denies any angina or ischemic symptoms either postoperatively or  since going home.   She is on,  1. Lisinopril 10 mg per day.  2. Metoprolol 50 mg p.o. b.i.d.  3. Lipitor 4 mg daily.  4. Celebrex 200 mg a day.  5. Detrol 4 mg per day.  6. Calcium 600.  7. Zinc 50 mg per day.  8. Glucosamine and chondroitin.  9. Aggrenox 200/25 b.i.d.  10.Vitamin D 5000 units 1 per week.  11.Alendronate 70 mg per week.   She is now being followed by Dr. Corliss Skains as her primary care physician  in [place].   Her blood pressure today was actually 141/87.  She had rushed into the  office.  She says this is highest that has been.  Heart rate is 81 and  regular, her weight is 170 which has no change.  HEENT is unremarkable  and unchanged.  Neck is supple.  Carotid upstrokes were equal  bilaterally without bruits.  No JVD.  Thyroid is not enlarged.  Trachea  is midline.  Lungs are clear to auscultation and percussion.  Heart  reveals a nondisplaced PMI, normal S1 and S2.  Abdominal exam is soft.  Good bowel sounds.  Extremities exhibit no significant edema.  Pulses  are intact.  Her surgical incision from her left knee replacement is  remarkably healed.   ASSESSMENT AND PLAN:  Brenda Hall is doing well.  She wants to reduce her  medications, though I  think that would be a bad idea.  I have answered  all of her questions which were numerous.  I will see her back in 6  months.     Thomas C. Daleen Squibb, MD, [facility]  Electronically Signed    TCW/MedQ  DD: 10/04/2008  DT: 10/05/2008  Job #: [id number]   cc:   Pollyann Savoy, M.D.

## 2010-09-04 NOTE — Letter (Signed)
Sep 05, 2006    W. Simone Curia, M.D.  [address]  [place], [place] [address]   RE:  Brenda, Hall  MRN:  [id number]  /  DOB:  29-Jan-1931   Dear Brett Canales,   Brenda Hall presented to the office today, unannounced, with chest  discomfort.  She describes a three-day history of virtually constant  pressure in the mid-substernal area that is fairly vague and diffuse and  relatively mild.  There is no radiation.  There are no associated  symptoms.  She has a history of irritable bowel syndrome, but it does  not sound as if she has been thought to have reflux.  She underwent  endoscopic studies in the past with Dr. Karilyn Cota, apparently without  significant pathology identified.  She has taken multiple Rolaids  without benefit, but feels that certain meals over the course of the  past few days have exacerbated her discomfort.  She does not have  nitroglycerin.  She recently received a cortisone injection for pain in  the left knee.   CURRENT MEDICATIONS INCLUDE:  1. Aspirin 81 mg daily.  2. Metoprolol 25 mg daily.  3. Celebrex 200 mg daily.  4. Os-Cal daily.  5. Premarin 0.625 mg daily.  6. Atorvastatin 40 mg daily.   She reports an ALLERGY TO CLOPIDOGREL and was treated with ticlopidine  after her stent was placed.   ON EXAM:  A pleasant, loquacious woman, in no acute distress.  The blood  pressure is 110/80, heart rate 75 and regular, respirations 18.  HEENT:  Anicteric sclerae; normal lids and conjunctivae.  NECK:  No jugular venous distention, no carotid bruits.  ENDOCRINE:  No thyromegaly.  HEMATOPOIETIC:  No adenopathy.  LUNGS:  Clear.  CARDIAC:  Normal first and second heart sounds; fourth heart sound  present; no murmurs, normal PMI.  ABDOMEN:  Soft and nontender.  Normal bowel sounds.  No masses, no  organomegaly.  EXTREMITIES:  No edema.  Distal pulses intact.  PSYCHIATRIC:  Alert and oriented, normal affect.  SKIN:  No significant lesions.   EKG:  Normal  sinus rhythm; right ventricular conduction delay; left  atrial abnormality; no acute abnormalities.   IMPRESSION:  Brenda Hall presents with mild prolonged chest discomfort  that is unlikely to represent myocardial ischemia.  Nonetheless, she  reports this as similar to the symptoms that preceded her prior cardiac  intervention.  I believe that this can be managed expeditiously on an  outpatient basis.  We will obtain laboratory testing to include basic  blood work and CPK-MB and troponin.  Assuming these are negative, we  will proceed with a stress Myoview this afternoon.   If her symptoms are not cardiac, they are more likely GI.  She will hold  Celebrex and start Protonix 40 mg daily.  I will reassess this nice  woman next week.    Sincerely,      Gerrit Friends. Dietrich Pates, MD, [facility]    RMR/MedQ  DD: 09/05/2006  DT: 09/05/2006  Job #: [id number]

## 2010-09-04 NOTE — Assessment & Plan Note (Signed)
OFFICE VISIT   Brenda Hall, Brenda Hall H  DOB:  March 15, 1931                                       05/05/2009  ZOXWR#:[id number]   ATTENDING PHYSICIAN:  Larina Earthly, M.D.   The patient is a 77 year old Caucasian female patient of Dr. Candie Chroman.  She is being seen today for 1 year followup for mild carotid artery  disease.  She does have a prior history of left brain stroke around  04/22/2008.  Her symptoms presented as right-sided weakness that she  felt most essentially resolved within an hour.  However, an MRI was done  which did confirm left posterior frontal deep white matter stroke.  A  carotid duplex had been done at [facility] showing 60%-70%  bilateral carotid artery stenosis.  However, when she saw Dr. Hart Rochester in  consultation 05/10/2008 our office repeated the carotid duplex and the  results showed only mild bilateral carotid artery stenosis in the 20%-  39% range.  She did have some right external carotid artery stenosis.  She has been asymptomatic since her initial stroke.  Her only symptom  that she notices is that her grip in her right hand is not quite as good  but overall has good function.  She denies any visual changes,  dysphagia, weakness, severe headaches or presyncope.  She remains quite  active doing water aerobics three times a week and is into scuba diving.  She continues to take Aggrenox which was started after her stroke.  She  cannot take Plavix secondary to an allergic reaction which causes a rash  and itching.   PAST MEDICAL HISTORY:  Significant for hypertension, coronary artery  disease with history of metal cardiac stent in [place] in 2008.  She has  had lumbar laminectomies x2 and multiple knee procedures, most recently  left knee replacement by Dr. Madelon Lips in April of this year.  She is  scheduled for right hammertoe surgery on 05/08/2009 at [facility].  She also has a history of removal of melanoma from her  back  in 2007 with removal of some axillary lymph nodes at that time.   FAMILY HISTORY:  Is significant for coronary artery disease in her  father who died of a myocardial infarction at age 47.  She does not  smoke or drink.   REVIEW OF SYSTEMS:  Is negative for chest pain, shortness of breath.  She does have some sinus congestion and drainage.  She denies blood in  her stool, dysuria or claudication symptoms.   PHYSICAL EXAMINATION:  Vital signs:  Blood pressure 144/79, heart rate  of 58, respirations 18.  General:  She is a well-nourished, pleasant  Caucasian female in no acute distress.  HEENT:  She does were glasses.  Her pupils are equal, reactive to light.  Her sclerae nonicteric.  Lungs:  Sounds are clear throughout.  Heart:  Sounds have a regular rate  and rhythm.  No murmur, rub or gallop was noted.  She has palpable  carotid pulses without bruits auscultated.  She had no peripheral edema.  Abdomen:  Her abdominal exam was soft, nontender.  I did not appreciate  any masses.  Neurologic:  Exam was intact.  Strength was good and strong  and symmetrical in both upper and lower extremities.   A carotid duplex exam done at our office today  showed mild internal  carotid artery plaque bilaterally with peak systolic velocity of 80 on  the right and 92 on the left with end diastolic velocity of 28 on the  right and 33 on the left.  This was stable from her previous exam.  She  did have a right external carotid artery peak systolic velocity of 186  and a left common carotid artery peak systolic of 119, these were both  in the 70s on the left.  These were reviewed with Dr. Gretta Began.  We  feel that since she has now had two carotid duplex scans that show only  mild 20%-39% carotid artery stenosis and because she has remained  asymptomatic with it that she does not need routine followup.  At this  point the patient feels more comfortable with at least another followup  in 2 years which  is quite reasonable.  We can definitely see her sooner  if she develops any symptoms in neurologic changes in the meantime.  As  mentioned earlier, she is scheduled for hammertoe surgery next week at  [facility].  They did inquire whether she could  hold her  Aggrenox.  She was instructed that she may hold her Aggrenox and take a  baby aspirin in the meantime.  From a vascular surgery standpoint feel  aspirin therapy would be sufficient in the long-term.  However,  ultimately it will be up to Dr. Valera Castle or Dr. Lubertha South if they  wish to transition her off the Aggrenox onto aspirin only.  She will  discuss this further with them.   Jerold Coombe, PA   Larina Earthly, M.D.  Electronically Signed   AWZ/MEDQ  D:  05/05/2009  T:  05/06/2009  Job:  [id number]   cc:   Donna Bernard, M.D.  Thomas C. Wall, MD, [facility]

## 2010-09-04 NOTE — Discharge Summary (Signed)
NAME:  Brenda Hall, Brenda Hall            ACCOUNT NO.:  [id number]   MEDICAL RECORD NO.:  [id number]          PATIENT TYPE:  INP   LOCATION:  A336                          FACILITY:  [facility]   PHYSICIAN:  Osvaldo Shipper, MD     DATE OF BIRTH:  05-17-1930   DATE OF ADMISSION:  04/21/2008  DATE OF DISCHARGE:  01/01/2010LH                               DISCHARGE SUMMARY   PMD:  Scott A. Gerda Diss, M.D.   DISCHARGE DIAGNOSES:  1. Acute left hemisphere stroke causing right-sided weakness  2. History of coronary artery disease.  3. History of hypertension and dyslipidemia.   Please review H and P dictated by Dr. Lilyan Punt for details regarding  the patient's presenting illness.   BRIEF HOSPITAL COURSE:  Briefly, this is a 77 year old white female who  has a history of coronary artery disease, diabetes and dyslipidemia, who  noticed right-sided numbness and weakness yesterday.  She presented to  the hospital where CAT scan did not show any acute bleeding.  MRI was  ordered and it showed an acute left posterior frontal deep white matter  infarct and microvascular ischemic changes.  MRA did not show any  significant intracranial stenoses.  An ultrasound of the carotids was  done which showed a possible up to 70% stenosis in bilateral proximal  ICA.   The patient's numbness has resolved.  She still has residual weakness on  the right side.  However, she is able to move it quite well and actually  has 5/5 strength.  She was seen by physical therapy and she was able to  ambulate without any assistive devices and PT felt that she did not  require any further physical therapy treatment.  She does swim and  exercises in water which was encouraged.   She was on aspirin prior to the stroke, so we are recommending that she  go on Aggrenox at this time.  She can discuss this issue further with  Dr. Lilyan Punt when she sees him on Monday.  She also had an  echocardiogram, however, the results of that are  not yet available.  The  patient will discuss the results of the echocardiogram as well as the  carotid ultrasound with Dr. Lilyan Punt.   Reviewing the H and P, he recommends that the patient go on aspirin,  however, I think Aggrenox might be a better option for her unless she  develops side effects such as headaches.  So I am going to write her a  prescription and this matter can be further discussed between the  patient and her PMD.   On the day of discharge the patient is feeling well.  Denies any  complaints.  She is keen on going home.  She has remained stable  overnight, her vital signs are all stable.  Neurologically. she has 5/5  strength bilaterally.  She has ambulated with no difficulties.  So I see  no reason why she cannot be discharged home today.   DISCHARGE MEDICATIONS:  1. Aggrenox 1 capsule once daily for 5 days and then b.i.d.  2. Lotensin 10 mg once daily.  3. Metoprolol 25 mg b.i.d.  4. Lipitor 40 mg daily.  5. She needs to hold her Celebrex for now.  6. She needs to discontinue the aspirin.   Follow up with Dr. Lilyan Punt early next week.   DIET:  Heart-healthy.   PHYSICAL ACTIVITY:  Discussed earlier.   Total time on this discharge encounter about 25 minutes.      Osvaldo Shipper, MD  Electronically Signed     GK/MEDQ  D:  04/22/2008  T:  04/22/2008  Job:  [id number]   cc:   Lorin Picket A. Gerda Diss, MD  Fax: [phone]

## 2010-09-04 NOTE — Assessment & Plan Note (Signed)
[facility]                            CARDIOLOGY OFFICE NOTE   NAME:Brenda Hall, Brenda Hall                  MRN:          [id number]  DATE:12/23/2006                            DOB:          12/03/1930    The patient returns today for further management of the following  issues:  1. Coronary artery disease, status post non-Q-wave infarction,      December 30, 2005.  She had a bare metal stent placed in the      circumflex with residual disease in the LAD and the right coronary      artery.  She had an EF of 52% with mild apical hyperkinesia.  She      had recent evaluation for substernal chest pain and had a stress      Myoview by Dr. Dietrich Pates.  This demonstrated an EF of 65% with no      evidence of ischemia.  Her discomfort responded to omeprazole 20 mg      a day.   She is doing well otherwise.  She needs renewal on her lisinopril,  Lipitor, and metoprolol.  She is getting ready to go on a cruise.   PHYSICAL EXAMINATION:  Blood pressure today is 130/80, her pulse is 67  and regular.  HEENT:  Unchanged.  NECK:  Carotid upstrokes are equal bilaterally without bruits.  No JVD.  Thyroid is not enlarged.  Trachea is midline.  LUNGS:  Clear.  HEART:  Reveals a regular rate and rhythm without murmur.  ABDOMEN:  Soft.  Good bowel sounds.  EXTREMITIES:  Reveal no edema.  Pulses are intact.  NEUROLOGIC:  Exam is intact.   EKG is normal today.   ASSESSMENT/PLAN:  Mrs. Brenda Hall is doing well.  Renewed her three cardiac  medications.  Continue with the aspirin.  We will plan to see her back  again in six months.     Thomas C. Daleen Squibb, MD, [facility]  Electronically Signed    TCW/MedQ  DD: 12/23/2006  DT: 12/23/2006  Job #: [id number]   cc:   Donna Bernard, M.D.

## 2010-09-04 NOTE — Procedures (Signed)
CAROTID DUPLEX EXAM   INDICATION:  Follow up known carotid artery disease.   HISTORY:  Diabetes:  No.  Cardiac:  Yes.  Hypertension:  Yes.  Smoking:  No.  Previous Surgery:  No.  CV History:  Amaurosis Fugax No, Paresthesias No, Hemiparesis No.                                       RIGHT             LEFT  Brachial systolic pressure:         133  Brachial Doppler waveforms:  Vertebral direction of flow:        Antegrade         Antegrade  DUPLEX VELOCITIES (cm/sec)  CCA peak systolic                   73                70  ECA peak systolic                   274               89  ICA peak systolic                   81                76  ICA end diastolic                   23                22  PLAQUE MORPHOLOGY:                  Calcified         Calcified  PLAQUE AMOUNT:                      Mild              Mild  PLAQUE LOCATION:                    ICA, ECA          ICA, ECA   IMPRESSION:  1. 20-39% stenosis noted in bilateral internal carotid arteries.  2. Antegrade bilateral vertebral arteries.       ___________________________________________  Quita Skye Hart Rochester, M.D.   MG/MEDQ  D:  05/10/2008  T:  05/10/2008  Job:  [id number]

## 2010-09-04 NOTE — Assessment & Plan Note (Signed)
[facility]                       [place] CARDIOLOGY OFFICE NOTE   NAME:Brenda Hall, Brenda Hall                  MRN:          [id number]  DATE:12/23/2007                            DOB:          [birth year]    Brenda Hall comes in today for further management of coronary  artery disease.   PROBLEMS:  1. Coronary artery disease.  She is status post a non-Q-wave      infarction on December 30, 2005.  She had a bare-metal stent      placed in the circumflex, residual disease in her LAD and right      coronary artery.  She had an EF of 52% with mild apical      hypokinesia.  She had a stress Myoview in the summer of 2008 which      showed an EF of 65% with no evidence of ischemia.  2. Gastroesophageal reflux.  This is helped dramatically by p.r.n.      omeprazole.  3. Hyperlipidemia.   She helped her granddaughter move in to [employer].  They had to climb 3  flights of stairs in scorching heat.  She had some chest tightness.  When she does her usual routine at the [facility], she has no chest tightness.   CURRENT MEDICATIONS:  1. Lisinopril 10 mg a day.  2. Metoprolol 25 mg p.o. b.i.d.  3. Lipitor 40 mg p.o. daily.  4. Premarin 0.625 mg daily.  5. Celebrex 200 mg daily.  6. Detrol 4 mg daily.  7. Aspirin 81 mg daily.  8. Calcium 600 and vitamin D, zinc, B6 and B12, glucosamine, and      chondroitin daily.  9. She takes omeprazole p.r.n.   PHYSICAL EXAMINATION:  VITAL SIGNS:  Blood pressure is 130/76.  Her  pulse is 72 and regular.  Her weight is 172, down 4.  HEENT:  Normal.  NECK:  Carotid upstrokes are equal bilaterally without bruits.  No JVD.  Thyroid is not enlarged.  Trachea is midline.  Neck is supple.  LUNGS:  Clear to auscultation and percussion.  HEART:  Nondisplaced PMI.  Normal S1 and S2.  ABDOMEN:  Soft, good bowel sounds.  No midline bruits.  EXTREMITIES:  No cyanosis, clubbing, or edema.  Venous varicosities are  present.  She has  no sign of DVT.  NEUROLOGIC:  Intact.  PSYCH:  She is alert and oriented.  Her affect is normal.   Brenda Hall is doing well.  I have renewed her sublingual  nitroglycerin, which is literally crumbling in the bottle.  I have also  renewed her lisinopril, metoprolol, and Lipitor.   I have reviewed the symptoms of angina.  If she begins to have those  with her routine activities she will let us know.  Otherwise, I will see  her back in 6 months.     Thomas C. Daleen Squibb, MD, [facility]  Electronically Signed    TCW/MedQ  DD: 12/23/2007  DT: 12/24/2007  Job #: [id number]   cc:   Donna Bernard, M.D.

## 2010-09-04 NOTE — Op Note (Signed)
NAME:  Brenda Hall, Brenda Hall           ACCOUNT NO.:  [id number]   MEDICAL RECORD NO.:  [id number]          PATIENT TYPE:  INP   LOCATION:  5013                         FACILITY:  [facility]   PHYSICIAN:  Dyke Brackett, M.D.    DATE OF BIRTH:  07-11-1930   DATE OF PROCEDURE:  08/03/2008  DATE OF DISCHARGE:                               OPERATIVE REPORT   PREOPERATIVE DIAGNOSIS:  Severe osteoarthritis, left knee.   POSTOPERATIVE DIAGNOSIS:  Severe osteoarthritis, left knee.   OPERATION:  Left total knee replacement (LCS total cemented knee,  standard tibia, standard femur, standard patella, size 3 tibia, 50-mm  bearing).   SURGEON:  Dyke Brackett, M.D.   ASSISTANT:  Benn Moulder, PA-C   BLOOD LOSS:  100   TOURNIQUET TIME:  1 hour 10 minutes.   DESCRIPTION OF PROCEDURE:  Sterile prep and drape, a straight skin  incision made and a medial parapatellar approach to the knee made.  The  most diseased lateral compartment was identified, cut 2.5 mm below with  an external tibial alignment guide with appropriate degree of valgus.  The anterior and posterior femoral cut was then made with a flexion gap  eventually measured at 15 mm, measured an extension gap with a 4-degree  distal valgus cut of 12.5 mm as well.  Flexion gap did equal extension  cut with good tensioning.  Moderate stripping in the medial side was  required to balance the knee.  A finishing guide was used after complete  sacrifice of the PCL and fixation of misguide and a small osteophytes  removed from the posterior aspect of the femur.      Dyke Brackett, M.D.  Electronically Signed     WDC/MEDQ  D:  08/03/2008  T:  08/04/2008  Job:  [id number]

## 2010-09-04 NOTE — Assessment & Plan Note (Signed)
[facility]                        CARDIOLOGY OFFICE NOTE   NAME:Horan, QUINNLEY COLASURDO                   MRN:          [id number]  DATE:07/11/2008                            DOB:          Nov 12, 1930    I was asked by Dr. Kristeen Miss to provide preoperative clearance for Ms.  Perlman for total left knee replacement.  A date has not been  scheduled.   Ms. Monia Pouch is currently having no symptoms of angina.  Her last stress  Myoview was in summer of 2008, which was remarkable for an EF of 65%  with no evidence of ischemia.   She did have a TIA around the holidays.  She was admitted to [facility].  At that time, her carotid Dopplers suggested some significant  carotid disease.  She was referred to Dr. Jerilee Field, a vein and  vascular specialist.  His report only showed 20-39% of internal carotid  artery stenoses with antegrade flow in both vertebrals.  She has been  treated medically with Aggrenox.  She has had no other symptoms of TIAs  or stroke.   She carries a diagnosis of coronary disease and has been stable.  She  has hypertension which has been under good control.   CURRENT MEDICATIONS:  1. Lisinopril 10 mg per day.  2. Metoprolol 25 mg p.o. b.i.d.  3. Lipitor 40 mg a day.  4. Celebrex 200 mg a day.  5. Detrol 4 mg a day.  6. Calcium and D.  7. Zinc 50 mg a day.  8. Glucosamine daily.  9. Aggrenox 200/25 b.i.d.  10.Vitamin D 5000 units 1 week.  11.Alendronate 70 mg weekly.   She takes fluticasone nasal spray p.r.n.  She does not take any beta-  adrenergic inhalers.   VITAL SIGNS:  Her blood pressure today is 122/78, her heart rate was 99  and regular.  EKG shows sinus rhythm bordering on sinus tachycardia with  no acute changes.  Her weight is 170.  HEENT:  Unremarkable.  NECK:  Carotid upstrokes were equal bilaterally without bruits.  No  thyromegaly.  Trachea is midline.  CHEST:  Lungs are clear to auscultation  and percussion.  HEART:  Regular rate and rhythm.  No gallop.  ABDOMEN:  Soft.  EXTREMITIES:  No edema.  She has some venous varicosities.  There is no  sign of DVT.  SKIN:  Few bruises.  Pulses are intact.  MUSCULOSKELETAL:  Chronic arthritic changes.   ASSESSMENT:  Ms. Monia Pouch' heart rate is higher than I am accustomed to.  She did take her metoprolol this morning, but is still on a low-dose.  She does not take any inhalers that stimulate her heart.   PLAN:  1. Increase metoprolol to 50 mg p.o. b.i.d.  2. She is cleared for surgery at a fairly low operative risk.  3. She can stop her Aggrenox 5 days prior to surgery.  4. Restart Aggrenox after surgery.  5. Take metoprolol on the day of surgery for beta-blocker protection      with postoperative beta-blockers as well.     Thomas C. Wall, MD,  [facility]  Electronically Signed    TCW/MedQ  DD: 07/11/2008  DT: 07/12/2008  Job #: [id number]   cc:   Dyke Brackett, M.D.  Donna Bernard, M.D.

## 2010-09-04 NOTE — H&P (Signed)
NAME:  Brenda Hall, Brenda Hall            ACCOUNT NO.:  [id number]   MEDICAL RECORD NO.:  [id number]          PATIENT TYPE:  INP   LOCATION:  A336                          FACILITY:  [facility]   PHYSICIAN:  Scott A. Gerda Diss, MD    DATE OF BIRTH:  [birth year]   DATE OF ADMISSION:  04/21/2008  DATE OF DISCHARGE:  LH                              HISTORY & PHYSICAL   CHIEF COMPLAINT:  Right side weakness.   HISTORY OF PRESENT ILLNESS:  This is a 77 year old spry female who  presents with chief complaint of numbness on the right side with a  little bit of weakness.  She states that this morning she got up, she  started fixing breakfast and she noticed her hand was started to feel  numb.  Then it progressed up her arm, then it progressed to the right  side of her body.  She states because her husband has had several  strokes in the past, she recognizes immediately what was going on.  She  called her son.  She got dressed.  Her son took her immediately to the  emergency department.  When she presented to the emergency department,  she noticed that she is having some weakness in the hand and the arm,  but she noted the numbness was getting better.  They went ahead and  ordered a CT scan that did not show any acute bleed or stroke.  An  MRI/MRA was ordered and while she was in the process of getting all that  done, the right-sided weakness and numbness started resolving.  Doppler  studies were also done.  The MRI showed an acute left posterior frontal  deep white matter infarct and microvascular ischemic changes.  MRA  showed a negative MRA and a cranial.  It should also be noted that lab  work was done and the INR looked good and the PTT looked good.  The  white count was 4.9, hemoglobin 12.5 and platelets of 330.  Met-7  overall looked good too.   PAST MEDICAL HISTORY:  The patient has a history of hypertension,  hyperlipidemia and heart attack.  She had a heart attack actually 2  years ago and had a  stent placed then.  She had an allergic reaction to  Plavix, so she takes a baby aspirin every day.   REVIEW OF SYSTEMS:  Denies headache, nausea, vomiting, chest pain,  shortness of breath.  Denies swelling, PND.   SOCIAL HISTORY:  She is married.  She does not smoke.  She does not  drink.   MEDICATIONS:  1. Lotensin 10 mg daily.  2. Metoprolol 25 mg b.i.d.  3. Lipitor 40 mg daily.  4. Aspirin 81 mg daily.  5. Celebrex daily.   I told the patient she should not be taking Celebrex during this  hospitalization or after she gets out.  I also told her that since she  already took aspirin today, she does need aspirin later today, but start  tomorrow a 325 mg aspirin daily.  It should be noted that she took two  regular-strength aspirins early on the morning  of Thursday.  It should  also be noted that carotid Doppler studies did show a mild build up of  50-69%, but no obvious blockage.  Echo was ordered, was taken, but there  is no report on this and unlikely to be back until early next week.   PHYSICAL EXAMINATION:  VITAL SIGNS:  Stable.  Blood pressure stable.  HEENT:  TMs NL, T-NL.  NECK:  No masses.  CHEST:  CTA.  No crackles.  HEART:  Regular.  No murmurs.  ABDOMEN:  Soft.  EXTREMITIES:  5/5 strength bilateral.  No subjective numbness.   ASSESSMENT/PLAN:  Probable small stroke with essentially transient  ischemic attack manifestation since she now has good strength bilateral.  I do feel that she is at risk of a repeat stroke, but I do not feel that  she needs any type of Coumadin or home Lovenox.  I would recommend  regular-strength aspirin 325 mg daily and continue her blood pressure  medicines and her cholesterol medicine.  If she is doing well morning,  she will get to go home with follow-up early next week with [place] and then  we will set her up with vascular surgeons for a second opinion regarding  her carotids, and then follow up on the echo.  She already has a   cardiologist, Dr. Daleen Squibb.  Dr. Osvaldo Shipper will be taking care of the  patient on the morning of April 23, 2007.  If the patient is doing  well, I expect her to be discharged home with full-strength aspirin.  I  told her to lay off of the Celebrex until this issue is better  delineated and follow up with Dr. __________ early next week and call on  Monday for an appointment, and certainly if she has stroke-like symptoms  to return immediately.      Scott A. Gerda Diss, MD  Electronically Signed     SAL/MEDQ  D:  04/21/2008  T:  04/21/2008  Job:  [id number]   cc:   Thomas C. Wall, MD, FACC  [address]  [address]  [place]  [place] [address]

## 2010-09-07 NOTE — Assessment & Plan Note (Signed)
[facility]                       [place] CARDIOLOGY OFFICE NOTE   NAME:MOONEY MELODYE, SWOR                  MRN:          [id number]  DATE:04/16/2006                            DOB:          Aug 29, 1930    Ms. Monia Pouch returns today for further management of her coronary artery  disease.  She is having no angina.  She remains on Ticlid and had a  normal CBC in November.  She is to have a follow-up CBC today.   Her lipids were nearly at goal with her LDL being 89 in October.  This  is on Lipitor 40 mg a day.  I did not change her dose because her HDL is  75.   MEDICATIONS:  Outlined in the chart.   PHYSICAL EXAMINATION:  VITAL SIGNS:  Blood pressure today is 120/70,  pulse 60 and regular, weight is 169.  GENERAL:  She is in no acute distress.  SKIN:  Warm and dry.  HEENT:  Sclerae clear, PERRLA.  Extraocular movements intact.  Facial  symmetry is normal.  Dentition is satisfactory.  Carotid upstrokes are  equal bilaterally without bruits.  There is no JVD.  Thyroid is not  enlarged.  Trachea is midline.  LUNGS:  Clear.  HEART:  Regular rate and rhythm with a split S2.  ABDOMEN:  Soft with good bowel sounds.  EXTREMITIES:  No edema.  Pulses are present.  SKIN:  Few ecchymoses.  MUSCULOSKELETAL:  She has chronic back issues and is having a lot of  back pain.   ASSESSMENT:  Ms. Monia Pouch is doing well from a cardiac standpoint.  I have  asked her to get a CBC today.  We will decrease her aspirin to 81 mg a  day in hopes of decreasing her bruising.  I will see her back in March  of 2008 in [place].  At that time, we will stop her Ticlid.     Thomas C. Daleen Squibb, MD, [facility]  Electronically Signed    TCW/MedQ  DD: 04/16/2006  DT: 04/16/2006  Job #: [id number]   cc:   Donna Bernard, M.D.

## 2010-09-07 NOTE — Discharge Summary (Signed)
[facility]  Patient:    Brenda Hall                         MRN: [id number] Adm. Date:  [id number] Disc. Date: [id number] Attending:  Teena Dunk Dictator:   Jamelle Rushing, P.A.                           Discharge Summary  ADMISSION DIAGNOSES:  1. Osteoarthritis, right knee.  2. Hypertension.  DISCHARGE DIAGNOSES:  1. Status post right total knee arthroplasty.  2. Hypertension.  3. Chemical dermatitis.  HISTORY OF PRESENT ILLNESS:  The patient is a 77 year old white female who was having right knee pain since the early 1990s.  The patient is a fairly active female doing a lot of hiking and diving, but has been having continued worsening knee pain ever since.  The pain is an aching sensation with awkward movements and increases with the amount of time in which she is weightbearing.  The pain got significantly worse approximately 4 years ago and had an arthroscopic evaluation of her knee with a meniscectomy on the lateral aspect of the right knee.  The patient did have some short-term improvement.  The pain gradually increased in intensity and frequency with weightbearing activities, up and down the stairs, and has gradually gotten to the point where the patient would like to have her knee replaced to improve her activities of daily living.  DRUG ALLERGIES:  The patient states she is allergic to EPINEPHRINE.  The patient states that she had one dose of it in her dentists office at one time and had swelling of her oral cavity.  CURRENT MEDICATIONS ON ADMISSION:  1. Prinivil 5 mg p.o. q.d.  2. Celebrex 200 mg p.o. q.d.  3. Aspirin 81 mg p.o. q.d.  4. Premarin 0.625 mg p.o. q.d.  5. Glucosamine/chondroitin 1 tablet p.o. q.d.  6. Os-Cal 1 tablet p.o. q.d.  7. Vitamin B12 one tablet p.o. q.d.  8. Flonase 2 puffs each nostril q.d.  9. Hydrocodone 1 tablet p.o. q.6h. p.r.n. pain.  OPERATIVE NOTE:  On April 18, 1999, the  patient was taken to the OR by Dr. Frederico Hamman.  He was assisted by Dr. Georgena Spurling.  The patient underwent a  right total knee replacement.  The patient had one Hemovac drain in place on return to the recovery room.  The patient tolerated the procedure well and was transferred to the recovery room in stable condition.  COMPLICATIONS:  None.  CONSULTS:  On December 29, rehab consult was requested and a pharmacy consult for Coumadin dosing.  HOSPITAL COURSE:  On April 18, 1999, the patient was admitted to the [facility] under the care of Dr. Frederico Hamman.  The patient was taken to the OR  where a right total knee arthroplasty was performed without any complications and patient was transferred to the recovery room and then to the orthopedic floor without any further complications.  Postop day #1:  The patient complained of some nausea and some pain in her right leg, but denied any shortness of breath or chest discomfort.  The patients T-max was 100.1.  H&H was 7.9 and 23.4.  Right knee had a bloody discharge dressing.  There was no erythema or active discharge presently.  The patient did have a tender leg, but distal leg was neuromotor vascularly intact.  Due to  the drop in the H&H, the patient was transfused 2 units of autologous blood today without any complications, and due to the patients continued pain her PCA was changed from morphine to Demerol with some better pain management.  The patient was started n her CPM today without any significant problems.  Postop days #2, 3, and 4:  The patient continued to tolerate her CPM well.  Had  pain well controlled with her Demerol PCA for the second postop day, and on day #3 was changed to oral pain medicines.  The patient remained afebrile, vital signs  stable, and her H&H did improve after receiving the two units of blood.  The patient continued to progress well with physical therapy and was evaluated  by rehab services.  On postop day #5, the patient did start developing an erythema macular rash across the upper back.  The patient stated that she was significantly sensitive to laundry soaps and it was thought to be a contact dermatitis.  The patient was given some Benadryl for any itching and was encouraged to be out of bed as much as possible to both eliminate the amount of time she is in contact with the sheets, plus also encourage range of motion and physical therapy.  On postop day #6, the patient was up out of bed, sitting in bedside chair, stating that the rash on her back had improved with getting into her own clothes but did request a cream.  The patient was given Aristocort cream for her back to be used sparingly.  The patients vital signs were stable.  The patients leg was benign and with no erythema, no Homans sign.  Distal ligaments were neuromotor vascularly intact.  The patient was planned today to continue physical therapy and transfer to rehab services.  LABORATORY:  On admission, H&H was 10.9 and 33.3.  On December 28, the patients  CBC was WBC 6.6, hemoglobin 7.9, hematocrit 23.4 with 268,000 platelets.  On December 30, WBCs were 7.9, hemoglobin 9.2, hematocrit 27.4 with 256,000 platelets.  On admission, patients coag studies showed PT at 13.1 with an INR of 1.1, with  PTT of 24.  On January 2, the patients PT was 21.6 with a 2.7 INR.  Routine chemistries on admission were totally normal with the exception of albumin at 3.4. Routine urinalysis on admission was totally normal with the exception of a few epithelial cells and a few bacteria cells present.  There was also some rare yeast cells present.  DISCHARGE MEDICATIONS:  1. Vancenase nasal spray 84 mcg 2 puffs per nostril q.d.  2. Premarin 0.625 p.o. q.d.  3. Altace 1.25 mg p.o. q.d.  4. Colace 100 mg p.o. b.i.d.  5. Coumadin 5 mg/day adjusted by pharmacy.  6. Percocet 1-2 tablets p.o. q.4-6h.  p.r.n. pain.  7. Benadryl 25 mg p.o. q.h.s. p.r.n. sleep.  8. Flexeril 10 mg p.o. q.i.d. p.r.n. spasm.  CONDITION ON DISCHARGE TO REHAB SERVICES:  Improved. DD:  05/24/99 TD:  05/24/99 Job: [id number]  [id number]

## 2010-09-07 NOTE — Assessment & Plan Note (Signed)
[facility]                            CARDIOLOGY OFFICE NOTE   NAME:Brenda SERAH, Hall                  MRN:          [id number]  DATE:06/23/2006                            DOB:          [birth year]    Brenda Hall returns today for further management of the following issues:  1. Coronary artery disease, status post non Q wave infarction      December 30, 2005.  2. Status post bare metal stent to the circumflex with residual      disease in the LAD and RCA.  She has normal left ventricular      systolic function with mild apical hypokinesia, EF of 52%.  3. Hyperlipidemia at goal on October 2007, on Lipitor 40.  4. Hypertension, will controlled with lisinopril.   She returns today not having any angina.  She is excited about going to  [place] and swimming with the sharks in the [place] for her  birthday in October.  She is also going to take a trip to [place].  She  is obviously doing well.  She is anxious to get off the Ticlid which we  can actually stop today.   Her medications are unchanged, she is on:  1. Aspirin 81 mg a day.  2. Metoprolol 25 mg a day.  3. Celebrex 200 mg a day.  4. Ticlid 250 b.i.d.  5. Lipitor 40 mg a day.  6. Premarin 0.0625 a day.  7. Lisinopril 10 mg a day.   She is in no acute distress.  Her blood pressure is 132/68, her pulse is  73 and regular.  Her weight is down 6 pounds to 163.  She is in no acute  distress.  SKIN:  Warm and dry.  HEENT:  Normocephalic/atraumatic, PERRLA/EOMI, sclerae are clear, facial  symmetry is normal.  She looks much younger than stated age.  Carotid upstrokes were equal bilaterally without bruits, there is no  JVD, thyroid is not enlarged.  NECK:  Supple, trachea is midline.  LUNGS:  Clear to auscultation and percussion.  HEART:  Reveals a non-displaced PMI, she has normal S1, S2, without  murmur, rub, or gallop.  ABDOMINAL:  Soft with good bowel sounds, there is no midline bruits,  no  hepatomegaly.  EXTREMITIES:  Reveal no cyanosis, clubbing, or edema.  Pulses are brisk.  NEURO:  Intact.  Scan shows no bruising to speak of.  MUSCULOSKELETAL:  Chronic arthritic changes.   EKG shows normal sinus rhythm with an rSr prime in V1 and 2 which is  old.  There has been no change.   ASSESSMENT/PLAN:  Brenda Hall is doing well.  I asked her about 20 minutes  worth of questions today about her medications.  I went through every  single one and we can only eliminate the Ticlid.  I have increased her  aspirin to 325 a day.  I will see her back in 6 months.  At that time  she will need an adenosine rest stress Myoview as well as lipids and  blood work in general.     Maisie Fus C. Wall, MD,  [facility]  Electronically Signed    TCW/MedQ  DD: 06/23/2006  DT: 06/23/2006  Job #: [id number]   cc:   Donna Bernard, M.D.

## 2010-09-07 NOTE — H&P (Signed)
NAMEMarland Kitchen  Brenda Hall, Brenda Hall                           ACCOUNT NO.:  [id number]   MEDICAL RECORD NO.:  [id number]                   PATIENT TYPE:  INP   LOCATION:  1828                                 FACILITY:  [facility]   PHYSICIAN:  Ocie Doyne, M.D.                 DATE OF BIRTH:  [birth year]   DATE OF ADMISSION:  11/26/2001  DATE OF DISCHARGE:                                HISTORY & PHYSICAL   CHIEF COMPLAINT:  Chest pain.   HISTORY OF PRESENT ILLNESS:  This is a 77 year old female with onset this  afternoon of crushing substernal chest pain and pressure which radiated into  her left arm.  There were no relieving or precipitating factors.  She had  the onset of pain while she was driving to a doctor's appointment.  She was  able to drive herself the remainder of the way to the emergency department.  The pain was 5/10 in severity at its worse and resolved spontaneously over  several minutes.  She had similar symptoms; one episode six months ago while  she was in [place] at high altitude.  In the emergency department, she was  pain free.  She was given one sublingual nitroglycerin.  She denies dyspnea  with diaphoresis.  On exam, she seemed well.   REVIEW OF SYSTEMS:  Negative for fever, chills, congestion, rhinorrhea, sore  throat, cough, palpitations, dyspnea, wheezing, nausea, vomiting, diarrhea,  constipation.   PAST MEDICAL HISTORY:  Hypertension and polymyalgia rheumatica status post  eight months of prednisone therapy which recently ended.   PAST SURGICAL HISTORY:  1. Status post D&C in 1971.  2. Hysterectomy in 1977.  3. Bladder surgery in 1994.  4. Vaginoplasty in 1995.  5. Tonsillectomy and adenoidectomy in 1994.  6. Right total knee replacement in December 2000.  7. Disk herniation L4-5 surgically repaired in 1982.   ALLERGIES:  EPINEPHRINE causes swelling of the face.   MEDICATIONS:  1. Celebrex 200 mg p.o. q.d.  2. Premarin dose unknown.  3. Multivitamin q.d.  4.  Actonel 35 mg p.o. each week.  5. Aspirin 81 mg p.o. q.d.  6. Lisinopril 5 mg p.o. q.d.  7. Flonase 2 sprays each nostril q.d.  8. She also takes B12, zinc, Os-Cal, glucosamine, and chondroitin.   SOCIAL HISTORY:  No tobacco.  Drinks alcohol socially.  She has been widowed  for five years and has three grown children living nearby.  She works as an  Geneticist, molecular.  She has a sister in [place] in good health.  Her husband died of MI at age 48 and she is very conscious of the symptoms  of heart disease.  She does water aerobics three times a week and is still  very involved with scuba diving and hiking.   FAMILY HISTORY:  Mother deceased of bladder cancer at age 25.  Father  deceased of MI at  age 40.  Son and daughter have hypertension.   PHYSICAL EXAMINATION:  VITAL SIGNS:  Temperature 97.3, heart rate 68.  Blood  pressure 190/107, recheck 104/84 and 134/65.  Oxygen saturation 98% on room  air.  GENERAL:  She is alert, oriented, very pleasant.  HEENT:  Extraocular movements intact.  Pupils are equal, round, and reactive  to light.  Tympanic membranes are within normal limits.  Oropharynx was  clear.  NECK:  Supple with no lymphadenopathy, no thyromegaly.  HEART:  Regular rate and rhythm with no murmur.  LUNGS:  Clear to auscultation.  ABDOMEN:  Soft, nontender, nondistended with normal bowel sounds.  No  masses, no organomegaly.  EXTREMITIES:  There is no edema, 2+ dorsalis pedis pulses, no lesions.   LABORATORY DATA:  I-STAT:  Sodium 137, potassium 4.1, chloride 106, CO2 28,  BUN 14, creatinine 0.9, glucose 97.  Hemoglobin 15, hematocrit 43.  I-STAT:  WBC 5.9, hemoglobin 12.7, hematocrit 38.1, platelets 358.  PT 12.6, INR 0.9,  PTT 24.  Total CK of 65, MB 1.9, troponin-I 0.01.   EKG shows normal sinus rhythm.  No Q waves.  Normal axis.  No ST-T segment  changes.  Portable chest film shows no active disease.  There is a right  lower lobe pulmonary nodule that should  be followed up with further study  when the patient is stable.   ASSESSMENT AND PLAN:  1. Chest pain, rule out for myocardial infarction.  Her first set of enzymes     and EKG were unremarkable in the emergency department.  She will be     admitted to telemetry overnight and ruled out with serial enzymes and     EKG.  Check fasting lipid panel in the morning.  She is already on an     aspirin and I will hold off on beta-blocker for right now.  Risk factors     include hypertension, postmenopausal although she is on estrogen, and a     family history.  She is not a smoker, is not sedentary, and is generally     in excellent health.  2. Hypertension.  She is stable on home medications.  3. Polymyalgia rheumatica.  Currently asymptomatic.  4. Pulmonary nodule seen on chest film, will need to follow up.                                               Ocie Doyne, M.D.    CY/MEDQ  D:  11/26/2001  T:  12/01/2001  Job:  [id number]   cc:   Donna Bernard, M.D.  [address]  [place]  [place] [address]  Fax: [phone]

## 2010-09-07 NOTE — Discharge Summary (Signed)
NAME:  Brenda Hall           ACCOUNT NO.:  [id number]   MEDICAL RECORD NO.:  [id number]          PATIENT TYPE:  INP   LOCATION:  5013                         FACILITY:  [facility]   PHYSICIAN:  Dyke Brackett, M.D.    DATE OF BIRTH:  Feb 15, 1931   DATE OF ADMISSION:  08/03/2008  DATE OF DISCHARGE:  08/08/2008                               DISCHARGE SUMMARY   ADMITTING DIAGNOSIS:  Left knee osteoarthritis.   DISCHARGE AND FINAL DIAGNOSIS:  Left knee osteoarthritis, status post  left total knee replacement.   SURGEON:  Dyke Brackett, MD.   PHYSICIAN ASSISTANT:  Sharol Given, PA   PROCEDURE:  The patient was admitted to the hospital on August 03, 2008,  for left knee osteoarthritis, status post left total knee replacement.   COMPLICATIONS:  None.   ESTIMATED BLOOD LOSS:  200 mL.   DISPOSITION:  The patient transferred to the PACU in stable condition.   HOSPITAL COURSE:  First postoperative day was on August 04, 2008, the  patient is status post left total knee replacement.  Hemoglobin of 10.  Vital signs are stable, afebrile.  The patient had significant pain in  her knee, but otherwise exam was benign and CPM plan was 0-90.  The  patient not tolerating well at this point with 50% weightbearing slow  with physical therapy.  Postoperative day #2, was on August 05, 2008,  hemoglobin 8.8.  Vital signs stable, afebrile.  Continue CPM slow with  physical therapy.  Incision benign, left lower extremity neurovascularly  intact.  Postoperative day #3 was on August 06, 2008, the patient's  hemoglobin at 8.  Decided to transfuse 2 units of packed red blood  cells.  Hope for discharging home on Monday and vital signs were stable,  otherwise the patient is slow with physical therapy.  Postoperative day  #4, the patient's hemoglobin improved at 10, doing better with physical  therapy, but still refusing according to the therapy note, some parts of  therapy guarding to the CPM and ambulation.   Postop day #5 was on August 08, 2008, vital signs stable, afebrile.  Previous Doppler ultrasound of  the left lower extremity was negative for any DVT.  Hemoglobin was at  10.4.  No signs of infection involving her incision.  She is getting to  70 degrees apparently in her CPM.  Dressing changed.  The left lower  extremity neurovascularly intact.  So, I could discharge home on  postoperative day #5.  She did well with physical therapy as she has  been slow the whole time.  She has been 50% weightbearing continued on  left lower extremity and continued 6-8 hours a day CPM.  Call up later  in the day and noted she did well enough for physical therapy that she  was able to be discharged home.   ASSESSMENT AND PLAN:  Brenda Hall is a 77 year old female,  status post left total knee replacement discharged home on postoperative  day #5, doing well, discharged home at 50% weightbearing.  Home health  PT and home health registered nurse.  Plan for follow up with Dr.  Madelon Lips in 8-9 days for staple removal.  Regular diet and plan for 14  days postoperative Lovenox 40 mg subcu at 8 a.m.   DISCHARGE MEDICATIONS:  1. Percocet 5/325 1-2 tablets every 4-6 hours p.r.n. pain.  2. Robaxin 500 mg 1 tablet q.6-8 h. p.r.n. pain.  3. Lovenox 40 mg subcu at 8 a.m. each day for total of 14 days      postoperatively.  4. Aggrenox not planning to restart unless her regular doctor wanted      to restart as she is on Lovenox.  She is supposed to call and      contact to decide on that.  5. Lisinopril 10 mg daily.  6. Lipitor 40 mg nightly.  7. Detrol 4 mg daily.  8. Metoprolol 25 mg b.i.d.  9. Celebrex 200 mg daily.  10.Vitamin D 5000 units it looks like every week.  11.Calcium 70 mg every week and seems like an unusual dose of the      calcium, apparently she takes some per week.  12.Glucosamine/chondroitin 400 mg daily apparently.  13.Vitamin B6 100 mg daily.  14.Zinc 50 mg daily.       Sharol Given, PA      Dyke Brackett, M.D.  Electronically Signed    JBS/MEDQ  D:  08/17/2008  T:  08/18/2008  Job:  [id number]

## 2010-09-07 NOTE — Discharge Summary (Signed)
[place]  Patient:    Brenda Hall, Brenda Hall                          MRN: [id number] Adm. Date:  04/24/99 Disc. Date: 04/28/99 Attending:  Faith Rogue, M.D. Dictator:   Bynum Bellows. Idacavage, P.A.C.                           Discharge Summary  DIAGNOSES: 1. Right total knee arthroplasty. 2. Degenerative joint disease. 3. Hypertension. 4. Skin cancer. 5. Sore throat. 6. Rash.  HISTORY OF PRESENT ILLNESS:  The patient is a 77 year old female with increased right knee pain and advanced DJD who underwent right total knee replacement on December 27 by Dr. Madelon Lips.  She was placed on Coumadin for DVT prophylaxis, allowed weightbearing as tolerated.  She has had problems with pain.  PAST MEDICAL HISTORY:  Significant for hypertension, DJD, status post D&C and hysterectomy, status post T&A, status post lumbar surgery, and history of skin cancer.  SOCIAL HISTORY:  The patient lives alone in a one-level house with three steps to entry.  She was independent prior to admission.  Her sister from [place] will stay with her at discharge.  HOSPITAL COURSE:  The patient was admitted to [facility] inpatient rehabilitation where she received three hours of physical and occupational therapy on a daily basis.  She was noted to have a rash on her back and was treated symptomatically with hydrocortisone for this.  She was also noted to have a sore throat and was placed on Magic Mouth Wash.  Both of these symptoms improved significantly.  At the time of admission, the patient was noted to be at supervisory level with her bed mobility and transfers and able to ambulate 120 feet with a rolling walker.  At the time of discharge, she was noted to be modified independent with bed mobility and transfers and able to ambulate 150 feet with a rolling walker at the modified independent level.  DISCHARGE MEDICATIONS: 1. Coumadin 4 mg daily or as directed. 2. OxyContin CR  10 mg every 12 hours. 3. Tylox 1 to 2 every 4 hours as needed for pain. 4. Prinivil 5 mg daily. 5. Premarin 0.625 mg daily. 6. Flonase 2 puffs daily.  DISCHARGE INSTRUCTIONS:  She is to follow total knee precautions.  She is allowed weightbearing as tolerated on her right leg.  She is instructed to follow a balanced diet, keep her wound clean and dry.  She may shower.  She is to have home health physical therapy as well as pro time drawn Monday, January 8, with results to Dr. Gerda Diss.  Follow up with Dr. Madelon Lips, appointment in 10 days.  CONDITION ON DISCHARGE:  Stable.DD:  12/11/99 TD:  12/12/99 Job: [id number]

## 2010-09-07 NOTE — Assessment & Plan Note (Signed)
[facility]                         [place] [facility] OFFICE NOTE   NAME:Brenda Hall, Brenda WHETSEL                  MRN:          [id number]  DATE:02/05/2006                            DOB:          1930/07/10    Ms. Brenda Hall returns today for further management of her coronary artery  disease.  Please see my note and problem list from January 08, 2006.   Unfortunately, I do not think she has had any blood drawn for her CBC on  Ticlid and aspirin.  Will arrange for her to have fasting blood work  including a CBC, lipids, and comprehensive metabolic panel this week.   She has no symptoms of angina or ischemia.  She is involved in cardiac rehab  and seems very compliant.  She was having a lot of arthritic pain  particularly in her back from her disc disease and called to ask if she  could be on Celebrex.  We placed her on 200 mg a day with significant  relief.   Her medications are unchanged otherwise.   EXAMINATION TODAY:  VITAL SIGNS:  Her blood pressure is 110/76, her pulse is  64 and regular, her weight is 168.  GENERAL:  She is in no acute distress.  She is extremely pleasant.  HEENT:  Unchanged, unremarkable.  Carotids are full without bruits.  There  is no JVD.  Thyroid is not enlarged, trachea is midline.  LUNGS:  Clear to auscultation.  HEART:  Reveals a normal S1, S2, without murmur, rub or gallop.  ABDOMEN:  Soft with good bowel sounds.  There is no hepatomegaly.  EXTREMITIES:  Reveal no cyanosis, clubbing or edema.  Pulses are brisk.  NEUROLOGIC:  Intact.   EKG was not repeated.  Blood work is pending as mentioned above.   ASSESSMENT AND PLAN:  Coronary artery disease status post non-ST-segment-  elevation myocardial infarction December 31, 2005, with a bare-metal stent  to the circumflex.  She has some nonobstructive residual disease in the left  anterior descending and the right coronary artery.  She has normal left  ventricular function with apical hypokinesia.   PLAN:  1. Check comprehensive metabolic panel, lipid panel, and CBC with      platelets.  2. Continue Ticlid and aspirin for 6 months, then aspirin thereafter.  3. No surgical procedures or spinal procedures until off Ticlid.  4. Continue cardiac rehab.  5. Follow up with me the week of April 15, 2006, in [place].       Thomas C. Daleen Squibb, MD, [facility]      TCW/MedQ  DD:  02/05/2006  DT:  02/06/2006  Job #:  [id number]   cc:   Donna Bernard, M.D.

## 2010-09-07 NOTE — Assessment & Plan Note (Signed)
[facility]                         [place] [facility] OFFICE NOTE   NAME:Hall, Brenda BISCHOF                  MRN:          [id number]  DATE:01/08/2006                            DOB:          [birth year]    I was asked by Dr. Lubertha South to evaluate Brenda Hall, a delightful  77 year old married white female, with a recent subendocardial MI while  vacationing in [place].   She presented with chest tightness and left arm discomfort.  She had ST  segment depression with T wave inversion in the inferolateral leads.   She was transferred to [facility] in [place] the next day.  She underwent cardiac catheterization which showed a 50 to 70% mid LAD, 99%  proximal circ, 30 to 40% proximal right coronary lesion.  Her left  ventricular function was normal.  There was some mild apical hypokinesis, EF  of 52%.   She underwent stenting with a bare-metal stent (Vision) by Dr. Allena Katz to the  circumflex.   Since discharge she has had no angina.  However, she broke out in diffuse  rash over her chest, lower back, down to her legs now.  It was felt to be  secondary to Plavix.  Her Plavix was discontinued yesterday, and she was  started on ticlopidine 250 mg once a day.   MEDICATIONS:  Her other medicines are:  1. Aspirin 325 mg a day.  2. Metoprolol 25 mg a day.  3. Lisinopril 10 mg a day.  4. Arthritis-Ease b.i.d.  5. Lipitor 40 mg a day, which is new.  6. Premarin 0.0625 mg a day.  7. Os-Cal.  8. Zinc.  9. B12.  10.B6.   PAST MEDICAL HISTORY:  Significant for polymyalgia rheumatica, hypertension  and hyperlipidemia.   She does not smoke, has no history of diabetes.   She is very active.   ALLERGIES:  She is obviously intolerant of PLAVIX, which is new.   SOCIAL HISTORY:  She is married to [employer].  She is very active.   REVIEW OF SYSTEMS:  Unremarkable.   PREVIOUS SURGERIES:  1. Hysterectomy.  2. Two  back operations.  3. Two nasal surgeries.  4. Bladder tack.  5. Arthroscopy of the right knee.  6. Knee replacement of the right knee.  7. Melanoma removed from her back.   PHYSICAL EXAMINATION:  Her blood pressure is 110/62, her heart rate is 71  and regular.  EKG shows persistent T wave changes inferolaterally.  She  weighs 167 pounds.  She is very pleasant, younger than stated age.  She is PERRLA, extraocular movements intact.  Facial symmetry is normal.  Dentition satisfactory.  Carotid upstrokes are equal bilaterally without bruits, no JVD.  The thyroid  is not enlarged.  Trachea is midline.  LUNGS:  Clear.  HEART:  Reveals a regular rate and rhythm with an S4 versus split S1.  ABDOMEN:  Exam is soft with good bowel sounds, no midline bruit.  Her right  groin site is stable.  EXTREMITIES:  Revealed good pulses, both dorsalis pedis and posterior  tibial.  There is no edema, there  are varicose veins.  SKIN:  Shows a diffuse maculopapular rash consistent with a drug reaction.   ASSESSMENT:  1. Coronary artery disease, status post subendocardial myocardial      infarction December 30, 2005.  She is status post bare-metal stent  to      the proximal circumflex.  She has residual 50 to 70% left anterior      descending, 30 to 40% right coronary artery, she has normal left      ventricular function with apical hypokinesia.  She has persistent EKG      changes.  2. PLAVIX allergy.  3. Hypertension.  4. Hyperlipidemia.   PLAN:  1. Continue current medications.  I have increased her ticlopidine to 250      mg b.i.d.  She will need a CBC in 10 days to 2 weeks.  2. Sublingual nitroglycerin given, with instructions on how to activate      911 with an acute coronary syndrome, and how to treat angina with      nitroglycerin.  3. Lipids and comprehensive metabolic panel in 6 weeks.  4. Follow up with me October 17.                                   Thomas C. Daleen Squibb, MD, [facility]    TCW/MedQ  DD:  01/08/2006  DT:  01/10/2006  Job #:  [id number]   cc:   Donna Bernard, M.D.

## 2010-09-07 NOTE — Op Note (Signed)
[facility]  Patient:    Brenda Hall                         MRN: [id number] Proc. Date: 04/18/99 Adm. Date:  [id number] Attending:  Teena Dunk                           Operative Report  PREOPERATIVE DIAGNOSIS:  Osteoarthritis, right knee.  POSTOPERATIVE DIAGNOSIS:  Osteoarthritis, right knee.  OPERATION:  Right total knee replacement (LCS standard plus cemented knee with 15 mm bearing). SURGEON:  Sharlot Gowda., M.D.  ASSISTANT:  Georgena Spurling, M.D.  ANESTHESIA:  ESTIMATED BLOOD LOSS:  TOURNIQUET TIME:  One hour ten minutes  INDICATIONS:  DESCRIPTION OF PROCEDURE:  Sterile prep and drape.  Approached the knee with straight incision, medial parapatellar approach.  The knee was noted to be in neutral.  The approach is slight varus, although there was significant roll on oth compartments.  Cut was posterior slope about 10 mm from the most diseased compartment.  PCL and menisci were removed.  The femur was sized to be standard  plus followed by insertion of the jig for the anterior posterior femoral cut with release of the medial collateral ligament which was moderate tight.  The distal  femur was cut in 5 degrees of valgus followed by an anterior posterior chamfers  central peg holes for the prosthesis and removal of posterior osteophytes.  The  tibia was cut for the standard plus tibia after removal of residual soft tissue  including menisci and posterior cruciate ligament. Barium was judged to be most  appropriate match at 15 mm.  Range of motion 0 to 120 with no instability noted. Patella was cut with the clamp leaving 12 to 13 mm of bone with a cutting jig.  Patella tracked normally with the trial. Trial components were removed followed by position of the components with cement. Final components with antibiotic impregnated cement.  Cement was allowed to harden.  Again the 15 mm bearing deemed to be  most appropriate.  The final barium placed.  Hemovac drain placed exiting  superior laterally.  Closure was affected with #1 Ethibon, 2-0 Vicryl skin clips. Marcaine with epinephrine infiltrating the skin.  Lightly compressive sterile dressing. Knee immobilizer applied and taken to the recovery room in stable condition. DD:  04/18/99 TD:  04/19/99 Job: [id number]

## 2010-09-07 NOTE — Discharge Summary (Signed)
Brenda Hall, Brenda Hall                           ACCOUNT NO.:  [id number]   MEDICAL RECORD NO.:  [id number]                   PATIENT TYPE:  INP   LOCATION:  3738                                 FACILITY:  [facility]   PHYSICIAN:  Nani Gasser, M.D.            DATE OF BIRTH:  1931/01/06   DATE OF ADMISSION:  11/26/2001  DATE OF DISCHARGE:  11/27/2001                                 DISCHARGE SUMMARY   DISCHARGE DIAGNOSES:  1. Chest pain.  2. Hypertension.  3. Polymyalgia rheumatica.  4. A pulmonary nodule of unknown etiology on chest x-ray.   DISCHARGE MEDICATIONS:  The patient was discharged with all of her home  medications.   FOLLOW UP:  Follow up with Dr. Gerda Diss in one week.  I tried to contact his  office and they were closed, thus I left it up to the patient to make a  followup appointment.   HOSPITAL COURSE:  The patient is a 77 year old white female who suddenly  complained yesterday while driving to her rheumatologist's office of  substernal chest pain which radiated into her left arm.  There were no  relieving or worsening factors.  She drove herself to the emergency  department.  The patient came in overnight and was maintained on her regular  home medications.  She had an EKG, which showed normal sinus rhythm with no  Q waves and no ST-T changes with a normal axis.  She also had three sets of  cardiac enzymes eight hours apart, which were negative.  The patient also  had a chest x-ray showing no acute pulmonary findings.  She did have a right  lower lobe nodule which may have been present on the prior study but  appeared larger, thus we recommended a followup chest CT, and stable  bibasilar scarring changes.  The following problems were addressed during  the patient's hospitalization:   1. CHEST PAIN OF UNKNOWN ETIOLOGY:  The patient ruled out for a myocardial     infarction.  She does have history of polymyalgia rheumatica but these     presenting symptoms were  not concurrent with her usual symptoms of an     acute exacerbation.  The patient is also on a drug called Actonel, which     can also cause erosive esophagitis.  The patient may be having some type     of mild reaction of the Actonel causing esophagitis, and thus chest pain.     We would suggest that her physicians carefully consider whether or not     this patient really needs to be on Actonel with its high risks of     esophagitis.   1. HYPERTENSION:  The patient's admission blood pressure was 198/101 and on     the date of discharge she had another blood pressure of 191/75.  The     patient says that normally when she goes to  her physician her blood     pressure is fairly well controlled on her home medication.  We would     suggest that the patient continue to be followed on an outpatient basis     for monitoring of her blood pressure.  We did not feel with the patient's     acute problems and hospitalization that we should make this change right     now.  The patient should be continued on a proton pump inhibitor in the     meantime.  Consider starting the patient on a diuretic, such as HCTZ, for     maintenance of her blood pressure.   1. POLYMYALGIA RHEUMATICA:  The patient had no acute exacerbations or flares     during her admission and no other joint or muscle complaints during her     stay.   1. The patient was noted to have a pulmonary nodule of unknown etiology in     the right lower lobe of a chest x-ray that was done.  It was felt that     this could potentially be larger than a previous chest x-ray done here.     This should be followed up in 2-3 months with a repeat chest x-ray or a     followup CT.   1. The patient continued to be on Premarin cream, even though she is status     post hysterectomy in 1977.  Please discuss with primary care physician     whether or not the patient needs to continue on Premarin with her     possible history of osteoporosis.                                                  Nani Gasser, M.D.    CM/MEDQ  D:  11/27/2001  T:  12/02/2001  Job:  [id number]   cc:   Sanjeev K. Corliss Skains, M.D.   Scott A. Gerda Diss, M.D.

## 2010-09-18 ENCOUNTER — Other Ambulatory Visit: Payer: Self-pay | Admitting: Cardiology

## 2010-10-02 ENCOUNTER — Encounter: Payer: Self-pay | Admitting: Cardiology

## 2010-10-02 ENCOUNTER — Other Ambulatory Visit: Payer: Self-pay

## 2010-10-03 ENCOUNTER — Other Ambulatory Visit (INDEPENDENT_AMBULATORY_CARE_PROVIDER_SITE_OTHER)

## 2010-10-03 DIAGNOSIS — I6529 Occlusion and stenosis of unspecified carotid artery: Secondary | ICD-10-CM

## 2010-10-09 NOTE — Procedures (Unsigned)
CAROTID DUPLEX EXAM  INDICATION:  Follow up carotid stenosis.  HISTORY: Diabetes:  No. Cardiac:  MI, stent. Hypertension:  Yes. Smoking:  No. Previous Surgery:  No. CV History:  Previous TIAs. Amaurosis Fugax No, Paresthesias No, Hemiparesis No.                                      RIGHT             LEFT Brachial systolic pressure:         150               136 Brachial Doppler waveforms:         WNL               WNL Vertebral direction of flow:        Antegrade         Antegrade DUPLEX VELOCITIES (cm/sec) CCA peak systolic                   122               92 ECA peak systolic                   246               100 ICA peak systolic                   81                88 ICA end diastolic                   23                25 PLAQUE MORPHOLOGY:                  Calcified         Calcified PLAQUE AMOUNT:                      Mild              Mild PLAQUE LOCATION:                    CCA, ICA, ECA     CCA, ICA  IMPRESSION: 1. Bilateral common carotid artery disease is present. 2. Bilateral internal carotid artery disease in the 1% to 39% range     present. 3. Right external carotid artery stenosis present. 4. Essentially unchanged since previous study on 05/05/2009.  ___________________________________________ Quita Skye. Hart Rochester, M.D.  SH/MEDQ  D:  10/03/2010  T:  10/03/2010  Job:  [id number]

## 2010-10-11 ENCOUNTER — Ambulatory Visit (INDEPENDENT_AMBULATORY_CARE_PROVIDER_SITE_OTHER): Payer: Medicare Other | Admitting: Cardiology

## 2010-10-11 ENCOUNTER — Encounter: Payer: Self-pay | Admitting: Cardiology

## 2010-10-11 VITALS — BP 151/80 | HR 63 | Ht 63.0 in | Wt 174.0 lb

## 2010-10-11 DIAGNOSIS — I252 Old myocardial infarction: Secondary | ICD-10-CM

## 2010-10-11 DIAGNOSIS — I2510 Atherosclerotic heart disease of native coronary artery without angina pectoris: Secondary | ICD-10-CM

## 2010-10-11 NOTE — Progress Notes (Signed)
HPI Brenda Hall returns today for the evaluation and management of her coronary artery disease. She's had one episode of chest tightness with extreme exertion about 3 weeks ago. It resolved with rest and a full strength aspirin. She did not take nitroglycerin. She has had none since and none before that.  She's very compliant with her medications. She's been on full dose aspirin since last visit.  Carotid Dopplers [facility] on [place] [facility], 2012. She has nonobstructive plaque. She has antegrade flow in both vertebrals. Studies reviewed with her.   Past Medical History  Diagnosis Date  . Coronary atherosclerosis of unspecified type of vessel, native or graft   . Other diseases of lung, not elsewhere classified     lung nodule  . DJD (degenerative joint disease) of knee   . IBS (irritable bowel syndrome)   . Polymyalgia rheumatica   . HTN (hypertension)   . Chest pain, unspecified     Past Surgical History  Procedure Date  . Lumbar laminectomy   . Removal of melanoma from back 2007  . Knee arthroscopy   . Total knee anthroplasty     No family history on file.  History   Social History  . Marital Status: Married    Spouse Name: N/A    Number of Children: N/A  . Years of Education: N/A   Occupational History  . Not on file.   Social History Main Topics  . Smoking status: Never Smoker   . Smokeless tobacco: Never Used   Comment: tobacco use - no  . Alcohol Use: No  . Drug Use: No  . Sexually Active: Not on file   Other Topics Concern  . Not on file   Social History Narrative   Married, retired. 3 children. Regular exercise -yes; hobbies = swimming.    Allergies  Allergen Reactions  . Clopidogrel Bisulfate     Current Outpatient Prescriptions  Medication Sig Dispense Refill  . alendronate (FOSAMAX) 70 MG tablet Take 70 mg by mouth every 7 (seven) days. Take with a full glass of water on an empty stomach.       Marland Kitchen amoxicillin  (AMOXIL) 500 MG capsule Take 500 mg by mouth as needed. 4 hrs prior to dental work       . aspirin 325 MG tablet Take 325 mg by mouth daily.        Marland Kitchen atorvastatin (LIPITOR) 40 MG tablet Take 1 tablet (40 mg total) by mouth daily.  30 tablet  6  . Calcium Carbonate-Vitamin D (CALCIUM-VITAMIN D) 600-200 MG-UNIT CAPS Take 1 capsule by mouth daily.        . Cholecalciferol (VITAMIN D) 2000 UNITS tablet Take 2,000 Units by mouth daily.        . diphenoxylate-atropine (LOMOTIL) 2.5-0.025 MG per tablet Take 1 tablet by mouth as needed.        . dipyridamole-aspirin (AGGRENOX) 25-200 MG per 12 hr capsule Take 1 capsule by mouth 2 (two) times daily.  60 capsule  2  . estrogens, conjugated, (PREMARIN) 0.3 MG tablet Take 0.3 mg by mouth daily. Take daily for 21 days then do not take for 7 days.       . fluticasone (FLONASE) 50 MCG/ACT nasal spray Place 2 sprays into the nose daily as needed.        Marland Kitchen lisinopril (PRINIVIL,ZESTRIL) 10 MG tablet Take 10 mg by mouth daily.        . Magnesium 250  MG TABS Take 1 tablet by mouth daily.        . metoprolol tartrate (LOPRESSOR) 25 MG tablet Take 25 mg by mouth 2 (two) times daily.        . Misc Natural Products (GLUCOSAMINE CHONDROITIN ADV PO) Take 1 tablet by mouth daily.        . NON FORMULARY Artro Silium - take 1 tablet daily       . pantoprazole (PROTONIX) 40 MG tablet Take 40 mg by mouth daily.        Marland Kitchen DISCONTD: tolterodine (DETROL LA) 4 MG 24 hr capsule Take 4 mg by mouth daily.          ROS Negative other than HPI.   PE General Appearance: well developed, well nourished in no acute distress, obese HEENT: symmetrical face, PERRLA, good dentition  Neck: no JVD, thyromegaly, or adenopathy, trachea midline Chest: symmetric without deformity Cardiac: PMI non-displaced, RRR, normal S1, S2, no gallop or murmur Lung: clear to ausculation and percussion Vascular: all pulses full without bruits  Abdominal: nondistended, nontender, good bowel sounds, no HSM,  no bruits Extremities: no cyanosis, clubbing , Minimal pitting edema right leg worse than left, no sign of DVT, no varicosities  Skin: normal color, no rashes Neuro: alert and oriented x 3, non-focal Pysch: normal affect  Filed Vitals:   10/11/10 0949  BP: 151/80  Pulse: 63  Height: 5\' 3"  (1.6 m)  Weight: 174 lb (78.926 kg)  SpO2: 94%    EKG  Labs and Studies Reviewed.   Lab Results  Component Value Date   WBC 4.8 01/30/2009   HGB 11.8* 01/30/2009   HCT 34.6* 01/30/2009   MCV 94.8 01/30/2009   PLT 264 01/30/2009      Chemistry      Component Value Date/Time   NA 141 01/30/2009 1138   K 4.1 01/30/2009 1138   CL 107 01/30/2009 1138   CO2 29 01/30/2009 1138   BUN 21 01/30/2009 1138   CREATININE 1.04 01/30/2009 1138      Component Value Date/Time   CALCIUM 8.8 01/30/2009 1138   ALKPHOS 60 01/30/2009 1138   AST 19 01/30/2009 1138   ALT 14 01/30/2009 1138   BILITOT 0.9 01/30/2009 1138       No results found for this basename: CHOL   No results found for this basename: HDL   No results found for this basename: LDLCALC   No results found for this basename: TRIG   No results found for this basename: CHOLHDL   No results found for this basename: HGBA1C   Lab Results  Component Value Date   ALT 14 01/30/2009   AST 19 01/30/2009   ALKPHOS 60 01/30/2009   BILITOT 0.9 01/30/2009   No results found for this basename: TSH

## 2010-10-11 NOTE — Patient Instructions (Signed)
**Note De-identified  Obfuscation** Your physician recommends that you continue on your current medications as directed. Please refer to the Current Medication list given to you today.  Your physician recommends that you schedule a follow-up appointment in: 1 year  

## 2010-10-11 NOTE — Assessment & Plan Note (Signed)
Stable. No change in medications. Return to clinic in one year.

## 2010-11-13 ENCOUNTER — Other Ambulatory Visit: Payer: Self-pay | Admitting: Cardiology

## 2010-11-15 ENCOUNTER — Telehealth: Payer: Self-pay | Admitting: Cardiology

## 2010-11-15 MED ORDER — LISINOPRIL 10 MG PO TABS: 10.0000 mg | ORAL_TABLET | Freq: Every day | ORAL | Status: DC

## 2010-11-15 MED ORDER — METOPROLOL TARTRATE 25 MG PO TABS: 25.0000 mg | ORAL_TABLET | Freq: Two times a day (BID) | ORAL | Status: DC

## 2010-11-15 NOTE — Telephone Encounter (Signed)
PT NEEDS METOPROLOL AND LISINOPRIL TO BE CALL IN TO [facility] # [phone]

## 2010-11-15 NOTE — Telephone Encounter (Signed)
Per pharmacy call regarding questions about dosage of metoprolol. Quantity was prescribed for 30 tablets, however pt takes RX 2x/day therefore pharmacy wanted to see if MD could up quantity to 60 tablets. Please return call to pharmacy to advise.

## 2011-01-25 LAB — CBC
HCT: 37.5 % (ref 36.0–46.0)
MCHC: 33.3 g/dL (ref 30.0–36.0)
MCV: 93.5 fL (ref 78.0–100.0)
RBC: 4.01 MIL/uL (ref 3.87–5.11)

## 2011-01-25 LAB — COMPREHENSIVE METABOLIC PANEL
AST: 19 U/L (ref 0–37)
BUN: 12 mg/dL (ref 6–23)
CO2: 28 mEq/L (ref 19–32)
Calcium: 8.7 mg/dL (ref 8.4–10.5)
Creatinine, Ser: 0.84 mg/dL (ref 0.4–1.2)
GFR calc Af Amer: >60 mL/min (ref >60)
GFR calc non Af Amer: >60 mL/min (ref >60)
Total Bilirubin: 0.7 mg/dL (ref 0.3–1.2)

## 2011-01-25 LAB — DIFFERENTIAL
Basophils Absolute: 0 10*3/uL (ref 0.0–0.1)
Eosinophils Relative: 3 % (ref 0–5)
Lymphocytes Relative: 36 % (ref 12–46)
Lymphs Abs: 1.8 10*3/uL (ref 0.7–4.0)
Neutro Abs: 2.6 10*3/uL (ref 1.7–7.7)
Neutrophils Relative %: 52 % (ref 43–77)

## 2011-01-25 LAB — PROTIME-INR
INR: 1 (ref 0.00–1.49)
Prothrombin Time: 13.2 seconds (ref 11.6–15.2)

## 2011-01-25 LAB — APTT: aPTT: 24 seconds (ref 24–37)

## 2011-04-15 ENCOUNTER — Other Ambulatory Visit: Payer: Self-pay

## 2011-04-15 MED ORDER — ATORVASTATIN CALCIUM 40 MG PO TABS: 40.0000 mg | ORAL_TABLET | Freq: Every day | ORAL | Status: DC

## 2011-04-23 HISTORY — PX: SPINE SURGERY: SHX786

## 2011-04-26 ENCOUNTER — Other Ambulatory Visit (HOSPITAL_COMMUNITY): Payer: Self-pay | Admitting: Rheumatology

## 2011-04-26 DIAGNOSIS — M5137 Other intervertebral disc degeneration, lumbosacral region: Secondary | ICD-10-CM | POA: Diagnosis not present

## 2011-04-26 DIAGNOSIS — M545 Low back pain: Secondary | ICD-10-CM

## 2011-04-30 DIAGNOSIS — R350 Frequency of micturition: Secondary | ICD-10-CM | POA: Diagnosis not present

## 2011-04-30 DIAGNOSIS — N3941 Urge incontinence: Secondary | ICD-10-CM | POA: Diagnosis not present

## 2011-05-02 ENCOUNTER — Ambulatory Visit (HOSPITAL_COMMUNITY)
Admission: RE | Admit: 2011-05-02 | Discharge: 2011-05-02 | Disposition: A | Discharge status: Home / Self Care | Payer: Medicare Other | Source: Ambulatory Visit | Attending: Rheumatology | Admitting: Rheumatology

## 2011-05-02 DIAGNOSIS — M79609 Pain in unspecified limb: Secondary | ICD-10-CM | POA: Insufficient documentation

## 2011-05-02 DIAGNOSIS — M8448XA Pathological fracture, other site, initial encounter for fracture: Secondary | ICD-10-CM | POA: Diagnosis not present

## 2011-05-02 DIAGNOSIS — M5137 Other intervertebral disc degeneration, lumbosacral region: Secondary | ICD-10-CM | POA: Diagnosis not present

## 2011-05-02 DIAGNOSIS — M545 Low back pain, unspecified: Secondary | ICD-10-CM | POA: Diagnosis not present

## 2011-05-02 DIAGNOSIS — M5126 Other intervertebral disc displacement, lumbar region: Secondary | ICD-10-CM | POA: Insufficient documentation

## 2011-05-02 DIAGNOSIS — M5380 Other specified dorsopathies, site unspecified: Secondary | ICD-10-CM | POA: Insufficient documentation

## 2011-05-06 ENCOUNTER — Other Ambulatory Visit (HOSPITAL_COMMUNITY): Payer: Self-pay | Admitting: Neurological Surgery

## 2011-05-06 DIAGNOSIS — M533 Sacrococcygeal disorders, not elsewhere classified: Secondary | ICD-10-CM

## 2011-05-06 DIAGNOSIS — M48061 Spinal stenosis, lumbar region without neurogenic claudication: Secondary | ICD-10-CM | POA: Diagnosis not present

## 2011-05-06 DIAGNOSIS — M545 Low back pain: Secondary | ICD-10-CM | POA: Diagnosis not present

## 2011-05-06 DIAGNOSIS — M4310 Spondylolisthesis, site unspecified: Secondary | ICD-10-CM | POA: Diagnosis not present

## 2011-05-07 ENCOUNTER — Ambulatory Visit (HOSPITAL_COMMUNITY)
Admission: RE | Admit: 2011-05-07 | Discharge: 2011-05-07 | Disposition: A | Discharge status: Home / Self Care | Payer: Medicare Other | Source: Ambulatory Visit | Attending: Neurological Surgery | Admitting: Neurological Surgery

## 2011-05-07 ENCOUNTER — Inpatient Hospital Stay (HOSPITAL_COMMUNITY): Admission: RE | Admit: 2011-05-07 | Payer: Medicare Other | Source: Ambulatory Visit

## 2011-05-07 DIAGNOSIS — M5146 Schmorl's nodes, lumbar region: Secondary | ICD-10-CM | POA: Diagnosis not present

## 2011-05-07 DIAGNOSIS — M5126 Other intervertebral disc displacement, lumbar region: Secondary | ICD-10-CM | POA: Insufficient documentation

## 2011-05-07 DIAGNOSIS — M545 Low back pain, unspecified: Secondary | ICD-10-CM | POA: Diagnosis not present

## 2011-05-07 DIAGNOSIS — M8448XA Pathological fracture, other site, initial encounter for fracture: Secondary | ICD-10-CM | POA: Diagnosis not present

## 2011-05-07 DIAGNOSIS — M47817 Spondylosis without myelopathy or radiculopathy, lumbosacral region: Secondary | ICD-10-CM | POA: Diagnosis not present

## 2011-05-09 ENCOUNTER — Other Ambulatory Visit (HOSPITAL_COMMUNITY): Payer: Medicare Other

## 2011-05-09 ENCOUNTER — Ambulatory Visit (HOSPITAL_COMMUNITY)
Admission: RE | Admit: 2011-05-09 | Discharge: 2011-05-09 | Disposition: A | Discharge status: Home / Self Care | Payer: Medicare Other | Source: Ambulatory Visit | Attending: Neurological Surgery | Admitting: Neurological Surgery

## 2011-05-09 DIAGNOSIS — Z780 Asymptomatic menopausal state: Secondary | ICD-10-CM | POA: Diagnosis not present

## 2011-05-09 DIAGNOSIS — M533 Sacrococcygeal disorders, not elsewhere classified: Secondary | ICD-10-CM

## 2011-05-09 DIAGNOSIS — M899 Disorder of bone, unspecified: Secondary | ICD-10-CM | POA: Insufficient documentation

## 2011-05-09 DIAGNOSIS — M949 Disorder of cartilage, unspecified: Secondary | ICD-10-CM | POA: Insufficient documentation

## 2011-05-13 DIAGNOSIS — M545 Low back pain: Secondary | ICD-10-CM | POA: Diagnosis not present

## 2011-05-21 DIAGNOSIS — T148XXA Other injury of unspecified body region, initial encounter: Secondary | ICD-10-CM | POA: Diagnosis not present

## 2011-05-21 DIAGNOSIS — I10 Essential (primary) hypertension: Secondary | ICD-10-CM | POA: Diagnosis not present

## 2011-05-21 DIAGNOSIS — M810 Age-related osteoporosis without current pathological fracture: Secondary | ICD-10-CM | POA: Diagnosis not present

## 2011-05-21 DIAGNOSIS — I259 Chronic ischemic heart disease, unspecified: Secondary | ICD-10-CM | POA: Diagnosis not present

## 2011-05-22 DIAGNOSIS — Z79899 Other long term (current) drug therapy: Secondary | ICD-10-CM | POA: Diagnosis not present

## 2011-05-22 DIAGNOSIS — I10 Essential (primary) hypertension: Secondary | ICD-10-CM | POA: Diagnosis not present

## 2011-05-28 DIAGNOSIS — N3941 Urge incontinence: Secondary | ICD-10-CM | POA: Diagnosis not present

## 2011-05-28 DIAGNOSIS — R350 Frequency of micturition: Secondary | ICD-10-CM | POA: Diagnosis not present

## 2011-06-03 ENCOUNTER — Other Ambulatory Visit: Payer: Self-pay | Admitting: Neurological Surgery

## 2011-06-03 ENCOUNTER — Ambulatory Visit
Admission: RE | Admit: 2011-06-03 | Discharge: 2011-06-03 | Disposition: A | Discharge status: Home / Self Care | Payer: Medicare Other | Source: Ambulatory Visit | Attending: Neurological Surgery | Admitting: Neurological Surgery

## 2011-06-03 DIAGNOSIS — M545 Low back pain: Secondary | ICD-10-CM

## 2011-06-03 DIAGNOSIS — M8448XA Pathological fracture, other site, initial encounter for fracture: Secondary | ICD-10-CM | POA: Diagnosis not present

## 2011-06-18 DIAGNOSIS — M19049 Primary osteoarthritis, unspecified hand: Secondary | ICD-10-CM | POA: Diagnosis not present

## 2011-06-18 DIAGNOSIS — L408 Other psoriasis: Secondary | ICD-10-CM | POA: Diagnosis not present

## 2011-06-25 DIAGNOSIS — N3941 Urge incontinence: Secondary | ICD-10-CM | POA: Diagnosis not present

## 2011-06-25 DIAGNOSIS — R350 Frequency of micturition: Secondary | ICD-10-CM | POA: Diagnosis not present

## 2011-07-02 DIAGNOSIS — S129XXA Fracture of neck, unspecified, initial encounter: Secondary | ICD-10-CM | POA: Diagnosis not present

## 2011-07-02 DIAGNOSIS — M545 Low back pain: Secondary | ICD-10-CM | POA: Diagnosis not present

## 2011-07-08 ENCOUNTER — Encounter: Payer: Self-pay | Admitting: Physician Assistant

## 2011-07-08 ENCOUNTER — Ambulatory Visit (INDEPENDENT_AMBULATORY_CARE_PROVIDER_SITE_OTHER): Payer: Medicare Other | Admitting: Physician Assistant

## 2011-07-08 VITALS — BP 131/66 | HR 66 | Ht 63.0 in | Wt 168.0 lb

## 2011-07-08 DIAGNOSIS — I10 Essential (primary) hypertension: Secondary | ICD-10-CM | POA: Diagnosis not present

## 2011-07-08 DIAGNOSIS — IMO0002 Reserved for concepts with insufficient information to code with codable children: Secondary | ICD-10-CM

## 2011-07-08 DIAGNOSIS — I2510 Atherosclerotic heart disease of native coronary artery without angina pectoris: Secondary | ICD-10-CM

## 2011-07-08 NOTE — Patient Instructions (Signed)
WE WILL SEND YOU A LETTER IN MAY TO SCHEDULE AN APPOINTMENT TO SEE DR. WALL IN THE [place] OFFICE.  NO CHANGES TODAY

## 2011-07-08 NOTE — Progress Notes (Signed)
[address] [place], [place]  [address] Phone: [phone] Fax:  [phone]  Date:  07/08/2011   Name:  Brenda Hall       DOB:  Jun 12, 1930 MRN:  [id number]  PCP:  Dr. Vicente Males Primary Cardiologist:  Dr. Valera Castle  Primary Electrophysiologist:  None    History of Present Illness: Brenda Hall is a 78 y.o. female who presents for surgical clearance.  She has a history of CAD, status post NSTEMI 9/07.  This occurred in [place], [place].  LHC at [facility]: Mid LAD 50-70%, proximal circumflex 99%, proximal RCA 30-40%, EF 52%.  She was treated with a BMS to the CFX.  Last nuclear study 5/08: Negative for ischemia, EF 65%.  Echocardiogram 12/09: Normal LV function, mild LAE, mild RAE.  Carotid Doppler 6/12:1/39% bilateral ICA stenosis.  Other history includes hypertension, hyperlipidemia, polymyalgia rheumatica, IBS and DJD.  She was last seen by Dr. Daleen Squibb 10/11/10.  Plan was for one-year followup at that time.  She has a vertebral fracture.  She needs kyphoplasty with Dr. Coralee Rud.  Surgery scheduled for this Thursday.  She is very active. She was swimming three times a week and exercising with weights as well as riding a bike in her neighborhood up until her back injury 3 mos ago.  She restricted her activity based upon "doctors orders."  She was able to achieve > 4 METs prior to her injury.  The patient denies chest pain, shortness of breath, syncope, orthopnea, PND or significant pedal edema.   Past Medical History  Diagnosis Date  . Coronary atherosclerosis of unspecified type of vessel, native or graft   . Other diseases of lung, not elsewhere classified     lung nodule  . DJD (degenerative joint disease) of knee   . IBS (irritable bowel syndrome)   . Polymyalgia rheumatica   . HTN (hypertension)   . Chest pain, unspecified     Current Outpatient Prescriptions  Medication Sig Dispense Refill  . alendronate  (FOSAMAX) 70 MG tablet Take 70 mg by mouth every 7 (seven) days. Take with a full glass of water on an empty stomach.       Marland Kitchen amoxicillin (AMOXIL) 500 MG capsule Take 500 mg by mouth as needed. 4 hrs prior to dental work       . aspirin 325 MG tablet Take 325 mg by mouth daily.        Marland Kitchen atorvastatin (LIPITOR) 40 MG tablet Take 1 tablet (40 mg total) by mouth daily.  30 tablet  6  . Calcium Carbonate-Vitamin D (CALCIUM-VITAMIN D) 600-200 MG-UNIT CAPS Take 1 capsule by mouth daily.        . Cholecalciferol (VITAMIN D) 2000 UNITS tablet Take 2,000 Units by mouth daily.        . diphenoxylate-atropine (LOMOTIL) 2.5-0.025 MG per tablet Take 1 tablet by mouth as needed.        . dipyridamole-aspirin (AGGRENOX) 25-200 MG per 12 hr capsule Take 1 capsule by mouth 2 (two) times daily.  60 capsule  2  . estrogens, conjugated, (PREMARIN) 0.3 MG tablet Take 0.3 mg by mouth daily. Take daily for 21 days then do not take for 7 days.       . fluticasone (FLONASE) 50 MCG/ACT nasal spray Place 2 sprays into the nose daily as needed.        Marland Kitchen lisinopril (PRINIVIL,ZESTRIL) 10 MG tablet Take 1 tablet (10 mg total) by  mouth daily.  30 tablet  12  . Magnesium 250 MG TABS Take 1 tablet by mouth daily.        . metoprolol tartrate (LOPRESSOR) 25 MG tablet Take 1 tablet (25 mg total) by mouth 2 (two) times daily.  30 tablet  12  . Misc Natural Products (GLUCOSAMINE CHONDROITIN ADV PO) Take 1 tablet by mouth daily.        . NON FORMULARY Artro Silium - take 1 tablet daily       . Ondansetron 8 MG FILM Take 8 mg by mouth as needed.      . pantoprazole (PROTONIX) 40 MG tablet Take 40 mg by mouth daily.        . tapentadol (NUCYNTA) 50 MG TABS Take 50 mg by mouth every 6 (six) hours as needed.      . Zinc 50 MG CAPS Take 50 mg by mouth daily.        Allergies: Allergies  Allergen Reactions  . Clopidogrel Bisulfate     History  Substance Use Topics  . Smoking status: Never Smoker   . Smokeless tobacco: Never Used    Comment: tobacco use - no  . Alcohol Use: No     ROS:  Please see the history of present illness.   All other systems reviewed and negative.   PHYSICAL EXAM: VS:  BP 131/66  Pulse 66  Ht 5\' 3"  (1.6 m)  Wt 168 lb (76.204 kg)  BMI 29.76 kg/m2 Well nourished, well developed, in no acute distress HEENT: normal Neck: no JVD at 90 degrees Cardiac:  normal S1, S2; RRR; no murmur Lungs:  clear to auscultation bilaterally, no wheezing, rhonchi or rales Abd: soft, nontender, no hepatomegaly Ext: no edema Skin: warm and dry Neuro:  CNs 2-12 intact, no focal abnormalities noted Psych: normal affect  EKG:  Sinus rhythm, rate 70, normal axis, PACs  ASSESSMENT AND PLAN:  1. CAD, NATIVE VESSEL  Stable.  No angina.  She was able to achieve > 4 METs prior to her injury without anginal symptoms.  Discussed with Dr. Dietrich Pates (DOD).  She may proceed with her surgery as planned without further cardiovascular testing.  She should be at acceptable risk.  Our service is available as needed.  Continue ASA if ok with surgery.  She should remain on beta blocker throughout the periop period to reduce risk of cardiovascular complications.  Follow up with Dr. Valera Castle in [place] as scheduled.    2. HYPERTENSION  Controlled.  Continue current therapy.    3. Vertebral fracture  Kyphoplasty pending as noted.       Luna Glasgow, PA-C  12:05 PM 07/08/2011

## 2011-07-10 ENCOUNTER — Encounter (HOSPITAL_COMMUNITY): Payer: Self-pay | Admitting: Pharmacy Technician

## 2011-07-10 ENCOUNTER — Ambulatory Visit (HOSPITAL_COMMUNITY): Admission: RE | Admit: 2011-07-10 | Payer: Medicare Other | Source: Ambulatory Visit

## 2011-07-10 ENCOUNTER — Ambulatory Visit: Payer: Medicare Other | Admitting: Physician Assistant

## 2011-07-10 ENCOUNTER — Encounter (HOSPITAL_COMMUNITY): Payer: Self-pay

## 2011-07-10 ENCOUNTER — Encounter (HOSPITAL_COMMUNITY)
Admission: RE | Admit: 2011-07-10 | Discharge: 2011-07-10 | Disposition: A | Discharge status: Home / Self Care | Payer: Medicare Other | Source: Ambulatory Visit | Attending: Neurosurgery | Admitting: Neurosurgery

## 2011-07-10 DIAGNOSIS — I10 Essential (primary) hypertension: Secondary | ICD-10-CM | POA: Diagnosis not present

## 2011-07-10 DIAGNOSIS — Z8673 Personal history of transient ischemic attack (TIA), and cerebral infarction without residual deficits: Secondary | ICD-10-CM | POA: Diagnosis not present

## 2011-07-10 DIAGNOSIS — I2510 Atherosclerotic heart disease of native coronary artery without angina pectoris: Secondary | ICD-10-CM | POA: Diagnosis not present

## 2011-07-10 DIAGNOSIS — Z01812 Encounter for preprocedural laboratory examination: Secondary | ICD-10-CM | POA: Diagnosis not present

## 2011-07-10 DIAGNOSIS — I252 Old myocardial infarction: Secondary | ICD-10-CM | POA: Diagnosis not present

## 2011-07-10 DIAGNOSIS — S32009A Unspecified fracture of unspecified lumbar vertebra, initial encounter for closed fracture: Secondary | ICD-10-CM | POA: Diagnosis not present

## 2011-07-10 DIAGNOSIS — R079 Chest pain, unspecified: Secondary | ICD-10-CM | POA: Diagnosis not present

## 2011-07-10 HISTORY — DX: Acute myocardial infarction, unspecified: I21.9

## 2011-07-10 LAB — BASIC METABOLIC PANEL
CO2: 27 mEq/L (ref 19–32)
Chloride: 99 mEq/L (ref 96–112)
Sodium: 135 mEq/L (ref 135–145)

## 2011-07-10 LAB — SURGICAL PCR SCREEN
MRSA, PCR: NEGATIVE
Staphylococcus aureus: POSITIVE — AB

## 2011-07-10 LAB — CBC
Platelets: 334 10*3/uL (ref 150–400)
RBC: 4.06 MIL/uL (ref 3.87–5.11)
WBC: 8.1 10*3/uL (ref 4.0–10.5)

## 2011-07-10 NOTE — Pre-Procedure Instructions (Signed)
20 Brenda Hall  07/10/2011   Your procedure is scheduled on:  07/11/11  Report to [facility] at 1000 AM.  Call this number if you have problems the morning of surgery: [phone]   Remember:   Do not eat food:After Midnight.  May have clear liquids: up to 4 Hours before arrival.  Clear liquids include soda, tea, black coffee, apple or grape juice, broth.  Take these medicines the morning of surgery with A SIP OF WATER: **amoxilicillin,premarin,flonase,lopressor,protonix   Do not wear jewelry, make-up or nail polish.  Do not wear lotions, powders, or perfumes. You may wear deodorant.  Do not shave 48 hours prior to surgery.  Do not bring valuables to the hospital.  Contacts, dentures or bridgework may not be worn into surgery.  Leave suitcase in the car. After surgery it may be brought to your room.  For patients admitted to the hospital, checkout time is 11:00 AM the day of discharge.   Patients discharged the day of surgery will not be allowed to drive home.  Name and phone number of your driver: family  Special Instructions: CHG Shower Use Special Wash: 1/2 bottle night before surgery and 1/2 bottle morning of surgery.   Please read over the following fact sheets that you were given: Pain Booklet, Coughing and Deep Breathing, MRSA Information and Surgical Site Infection Prevention

## 2011-07-10 NOTE — Progress Notes (Signed)
Echo in epic. Cardiac clearence in epic

## 2011-07-11 ENCOUNTER — Ambulatory Visit (HOSPITAL_COMMUNITY)
Admission: RE | Admit: 2011-07-11 | Discharge: 2011-07-11 | Disposition: A | Discharge status: Home / Self Care | Payer: Medicare Other | Source: Ambulatory Visit | Attending: Neurosurgery | Admitting: Neurosurgery

## 2011-07-11 ENCOUNTER — Encounter (HOSPITAL_COMMUNITY): Payer: Self-pay | Admitting: Anesthesiology

## 2011-07-11 ENCOUNTER — Ambulatory Visit (HOSPITAL_COMMUNITY): Payer: Medicare Other | Admitting: Anesthesiology

## 2011-07-11 ENCOUNTER — Ambulatory Visit (HOSPITAL_COMMUNITY): Payer: Medicare Other

## 2011-07-11 ENCOUNTER — Encounter (HOSPITAL_COMMUNITY)
Admission: RE | Disposition: A | Discharge status: Home / Self Care | Payer: Self-pay | Source: Ambulatory Visit | Attending: Neurosurgery

## 2011-07-11 ENCOUNTER — Encounter (HOSPITAL_COMMUNITY): Payer: Self-pay | Admitting: *Deleted

## 2011-07-11 DIAGNOSIS — X58XXXA Exposure to other specified factors, initial encounter: Secondary | ICD-10-CM | POA: Insufficient documentation

## 2011-07-11 DIAGNOSIS — Z8673 Personal history of transient ischemic attack (TIA), and cerebral infarction without residual deficits: Secondary | ICD-10-CM | POA: Insufficient documentation

## 2011-07-11 DIAGNOSIS — M519 Unspecified thoracic, thoracolumbar and lumbosacral intervertebral disc disorder: Secondary | ICD-10-CM | POA: Diagnosis not present

## 2011-07-11 DIAGNOSIS — Z01812 Encounter for preprocedural laboratory examination: Secondary | ICD-10-CM | POA: Insufficient documentation

## 2011-07-11 DIAGNOSIS — S32009A Unspecified fracture of unspecified lumbar vertebra, initial encounter for closed fracture: Secondary | ICD-10-CM | POA: Insufficient documentation

## 2011-07-11 DIAGNOSIS — I252 Old myocardial infarction: Secondary | ICD-10-CM | POA: Insufficient documentation

## 2011-07-11 DIAGNOSIS — I10 Essential (primary) hypertension: Secondary | ICD-10-CM | POA: Insufficient documentation

## 2011-07-11 DIAGNOSIS — S32020A Wedge compression fracture of second lumbar vertebra, initial encounter for closed fracture: Secondary | ICD-10-CM

## 2011-07-11 DIAGNOSIS — I2510 Atherosclerotic heart disease of native coronary artery without angina pectoris: Secondary | ICD-10-CM | POA: Diagnosis not present

## 2011-07-11 DIAGNOSIS — Z4789 Encounter for other orthopedic aftercare: Secondary | ICD-10-CM | POA: Diagnosis not present

## 2011-07-11 HISTORY — PX: VERTEBROPLASTY: SHX113

## 2011-07-11 SURGERY — VERTEBROPLASTY
Anesthesia: General | Wound class: Clean

## 2011-07-11 MED ORDER — VECURONIUM BROMIDE 10 MG IV SOLR
INTRAVENOUS | Status: DC | PRN
  Administered 2011-07-11: 4 mg via INTRAVENOUS

## 2011-07-11 MED ORDER — NEOSTIGMINE METHYLSULFATE 1 MG/ML IJ SOLN
INTRAMUSCULAR | Status: DC | PRN
  Administered 2011-07-11: 3.5 mg via INTRAVENOUS

## 2011-07-11 MED ORDER — HYDROMORPHONE HCL PF 1 MG/ML IJ SOLN: 0.2500 mg | INTRAMUSCULAR | Status: DC | PRN

## 2011-07-11 MED ORDER — ONDANSETRON HCL 4 MG/2ML IJ SOLN
INTRAMUSCULAR | Status: DC | PRN
  Administered 2011-07-11: 4 mg via INTRAVENOUS

## 2011-07-11 MED ORDER — LACTATED RINGERS IV SOLN
INTRAVENOUS | Status: DC | PRN
  Administered 2011-07-11: 13:00:00 via INTRAVENOUS

## 2011-07-11 MED ORDER — HYDROCODONE-ACETAMINOPHEN 5-500 MG PO TABS: 1.0000 | ORAL_TABLET | Freq: Four times a day (QID) | ORAL | Status: AC | PRN

## 2011-07-11 MED ORDER — GLYCOPYRROLATE 0.2 MG/ML IJ SOLN
INTRAMUSCULAR | Status: DC | PRN
  Administered 2011-07-11: .7 mg via INTRAVENOUS
  Administered 2011-07-11: .2 mg via INTRAVENOUS

## 2011-07-11 MED ORDER — EPHEDRINE SULFATE 50 MG/ML IJ SOLN
INTRAMUSCULAR | Status: DC | PRN
  Administered 2011-07-11: 10 mg via INTRAVENOUS
  Administered 2011-07-11: 5 mg via INTRAVENOUS
  Administered 2011-07-11 (×2): 10 mg via INTRAVENOUS

## 2011-07-11 MED ORDER — CEFAZOLIN SODIUM 1-5 GM-% IV SOLN
INTRAVENOUS | Status: DC | PRN
  Administered 2011-07-11 (×2): 1 g via INTRAVENOUS

## 2011-07-11 MED ORDER — 0.9 % SODIUM CHLORIDE (POUR BTL) OPTIME
TOPICAL | Status: DC | PRN
  Administered 2011-07-11: 1000 mL

## 2011-07-11 MED ORDER — ONDANSETRON HCL 4 MG/2ML IJ SOLN: 4.0000 mg | Freq: Four times a day (QID) | INTRAMUSCULAR | Status: DC | PRN

## 2011-07-11 MED ORDER — PROPOFOL 10 MG/ML IV EMUL
INTRAVENOUS | Status: DC | PRN
  Administered 2011-07-11: 130 mg via INTRAVENOUS

## 2011-07-11 MED ORDER — CEFAZOLIN SODIUM 1-5 GM-% IV SOLN
INTRAVENOUS | Status: AC
  Filled 2011-07-11: qty 50

## 2011-07-11 MED ORDER — CEFAZOLIN SODIUM 1-5 GM-% IV SOLN: 1.0000 g | INTRAVENOUS | Status: DC

## 2011-07-11 MED ORDER — FENTANYL CITRATE 0.05 MG/ML IJ SOLN
INTRAMUSCULAR | Status: DC | PRN
  Administered 2011-07-11 (×2): 75 ug via INTRAVENOUS

## 2011-07-11 SURGICAL SUPPLY — 36 items
BANDAGE ADHESIVE 1X3 (GAUZE/BANDAGES/DRESSINGS) ×3 IMPLANT
BLADE SURG 10 STRL SS (BLADE) ×1 IMPLANT
BLADE SURG 15 STRL LF DISP TIS (BLADE) ×1 IMPLANT
BLADE SURG 15 STRL SS (BLADE) ×2
BLADE SURG ROTATE 9660 (MISCELLANEOUS) IMPLANT
CLOTH BEACON ORANGE TIMEOUT ST (SAFETY) ×2 IMPLANT
CONT SPEC 4OZ CLIKSEAL STRL BL (MISCELLANEOUS) ×2 IMPLANT
DRAPE C-ARM 42X72 X-RAY (DRAPES) ×4 IMPLANT
DRAPE INCISE IOBAN 66X45 STRL (DRAPES) ×2 IMPLANT
DRAPE LAPAROTOMY 100X72X124 (DRAPES) ×2 IMPLANT
DURAPREP 26ML APPLICATOR (WOUND CARE) ×2 IMPLANT
GAUZE SPONGE 4X4 16PLY XRAY LF (GAUZE/BANDAGES/DRESSINGS) ×2 IMPLANT
GLOVE BIO SURGEON STRL SZ 6.5 (GLOVE) ×1 IMPLANT
GLOVE BIOGEL PI IND STRL 7.5 (GLOVE) IMPLANT
GLOVE BIOGEL PI INDICATOR 7.5 (GLOVE) ×2
GLOVE ECLIPSE 7.5 STRL STRAW (GLOVE) ×4 IMPLANT
GLOVE EXAM NITRILE LRG STRL (GLOVE) IMPLANT
GLOVE EXAM NITRILE XL STR (GLOVE) IMPLANT
GLOVE EXAM NITRILE XS STR PU (GLOVE) IMPLANT
GOWN BRE IMP SLV AUR LG STRL (GOWN DISPOSABLE) ×1 IMPLANT
GOWN BRE IMP SLV AUR XL STRL (GOWN DISPOSABLE) ×4 IMPLANT
GOWN STRL REIN 2XL LVL4 (GOWN DISPOSABLE) ×1 IMPLANT
KIT AUGMENTATION STABILIT VERT ×1 IMPLANT
KIT BASIN OR (CUSTOM PROCEDURE TRAY) ×2 IMPLANT
KIT ROOM TURNOVER OR (KITS) ×2 IMPLANT
MARKER SKIN DUAL TIP RULER LAB (MISCELLANEOUS) ×2 IMPLANT
NEEDLE HYPO 22GX1.5 SAFETY (NEEDLE) ×2 IMPLANT
PACK EENT II TURBAN DRAPE (CUSTOM PROCEDURE TRAY) ×2 IMPLANT
PAD ARMBOARD 7.5X6 YLW CONV (MISCELLANEOUS) ×2 IMPLANT
STABILIT FIRST FRACTURE KIT ×1 IMPLANT
STAPLER SKIN PROX WIDE 3.9 (STAPLE) ×2 IMPLANT
SYR CONTROL 10ML LL (SYRINGE) ×1 IMPLANT
SYSTEM BONE CEMENT MIXING (KITS) ×1 IMPLANT
StabiliT ER2 Bone Cement ×1 IMPLANT
TOWEL OR 17X24 6PK STRL BLUE (TOWEL DISPOSABLE) ×2 IMPLANT
TOWEL OR 17X26 10 PK STRL BLUE (TOWEL DISPOSABLE) ×2 IMPLANT

## 2011-07-11 NOTE — Preoperative (Signed)
Beta Blockers   Reason not to administer Beta Blockers:Not Applicable 

## 2011-07-11 NOTE — Transfer of Care (Signed)
Immediate Anesthesia Transfer of Care Note  Patient: Brenda Hall  Procedure(s) Performed: Procedure(s) (LRB): VERTEBROPLASTY (N/A)  Patient Location: PACU  Anesthesia Type: General  Level of Consciousness: awake, alert  and oriented  Airway & Oxygen Therapy: Patient Spontanous Breathing and Patient connected to nasal cannula oxygen  Post-op Assessment: Report given to PACU RN, Post -op Vital signs reviewed and stable and Patient moving all extremities  Post vital signs: Reviewed and stable  Complications: No apparent anesthesia complications

## 2011-07-11 NOTE — Anesthesia Procedure Notes (Signed)
Procedure Name: Intubation Date/Time: 07/11/2011 1:36 PM Performed by: Carmela Rima Pre-anesthesia Checklist: Patient identified, Timeout performed, Emergency Drugs available, Suction available and Patient being monitored Patient Re-evaluated:Patient Re-evaluated prior to inductionOxygen Delivery Method: Circle system utilized Preoxygenation: Pre-oxygenation with 100% oxygen Intubation Type: IV induction Ventilation: Mask ventilation without difficulty Laryngoscope Size: Miller and 2 Grade View: Grade II Tube type: Oral Tube size: 7.5 mm Number of attempts: 1 Placement Confirmation: ETT inserted through vocal cords under direct vision,  breath sounds checked- equal and bilateral and positive ETCO2 Secured at: 23 cm Tube secured with: Tape Dental Injury: Teeth and Oropharynx as per pre-operative assessment

## 2011-07-11 NOTE — Op Note (Signed)
Preop diagnosis: Compression fracture L2 Postop diagnosis: Same Procedure: L2 kyphoplasty Surgeon: Mariem Skolnick  After being placed in the prone position the patient's back was prepped and draped in the usual sterile fashion. AP and lateral fluoroscopy were aligned to identify the left pedicle of L2. Jamshidi needle was passed from a lateral to medial direction through the pedicle and followed in good position with AP and lateral fluoroscopy. Once in the appropriate position the cavity was made and then bone cement  delivered without difficulty. The delivery was followed with AP and lateral fluoroscopy. We had adequate fill we slowly withdrew the Jamshidi needle and compressed the cement so that no tail was left. It was then removed completely and final fluoroscopy looked good. We placed 2 staples on the skin along with a Band-Aid. The patient was taken to recovery in stable condition.

## 2011-07-11 NOTE — Progress Notes (Signed)
Pt has two staples in lumbar area left of center .... No pain in her back only c/o pain in the throat.

## 2011-07-11 NOTE — Anesthesia Preprocedure Evaluation (Signed)
Anesthesia Evaluation  Patient identified by MRN, date of birth, ID band Patient awake    Reviewed: Allergy & Precautions, H&P , NPO status , Patient's Chart, lab work & pertinent test results  Airway Mallampati: II  Neck ROM: full    Dental   Pulmonary          Cardiovascular hypertension, + CAD and + Past MI     Neuro/Psych    GI/Hepatic   Endo/Other    Renal/GU      Musculoskeletal   Abdominal   Peds  Hematology   Anesthesia Other Findings   Reproductive/Obstetrics                           Anesthesia Physical Anesthesia Plan  ASA: III  Anesthesia Plan: General   Post-op Pain Management:    Induction: Intravenous  Airway Management Planned: Oral ETT  Additional Equipment:   Intra-op Plan:   Post-operative Plan: Extubation in OR  Informed Consent: I have reviewed the patients History and Physical, chart, labs and discussed the procedure including the risks, benefits and alternatives for the proposed anesthesia with the patient or authorized representative who has indicated his/her understanding and acceptance.     Plan Discussed with: CRNA and Surgeon  Anesthesia Plan Comments:         Anesthesia Quick Evaluation

## 2011-07-11 NOTE — H&P (Signed)
Brenda Hall is an 78 y.o. female.   Chief Complaint: Back pain HPI: The patient is a 78 year old female who is a patient of Dr. Marikay Alar. He's been following her from back issues and noted recently on an MRI scan that she had a new compression fracture of L2. While this did not necessarily explain all of her difficulty he does explain some increasing pain recently and he sent her to me for consideration of vertebroplasty. After discussing the options and the risks and benefits involved in the procedure in general the patient requested surgery now comes for an L2 vertebroplasty. I particular explain to her that this will most likely not take care of all of her pain but will hopefully significantly decreased similar maneuver more significant pain that she is having.  Past Medical History  Diagnosis Date  . Coronary atherosclerosis of unspecified type of vessel, native or graft   . Other diseases of lung, not elsewhere classified     lung nodule  . DJD (degenerative joint disease) of knee   . IBS (irritable bowel syndrome)   . Polymyalgia rheumatica   . HTN (hypertension)   . Chest pain, unspecified   . History of TIA (transient ischemic attack)   . Myocardial infarction     5 yrs ago     dr wall  cardiac    Past Surgical History  Procedure Date  . Lumbar laminectomy   . Removal of melanoma from back 2007  . Knee arthroscopy   . Total knee anthroplasty   . Cardiac catheterization   . Joint replacement     6 knee surgeries   bil replacement    History reviewed. No pertinent family history. Social History:  reports that she has never smoked. She has never used smokeless tobacco. She reports that she does not drink alcohol or use illicit drugs.  Allergies:  Allergies  Allergen Reactions  . Epinephrine Other (See Comments)    During dental procedure-caused facial swelling at injection site  . Clopidogrel Bisulfate Itching and Rash    Medications Prior to Admission    Medication Dose Route Frequency Provider Last Rate Last Dose  . ceFAZolin (ANCEF) 1-5 GM-% IVPB           . ceFAZolin (ANCEF) IVPB 1 g/50 mL premix  1 g Intravenous 60 min Pre-Op Reinaldo Meeker, MD       Medications Prior to Admission  Medication Sig Dispense Refill  . aspirin 325 MG tablet Take 325 mg by mouth daily.       Marland Kitchen atorvastatin (LIPITOR) 40 MG tablet Take 40 mg by mouth daily.      . Calcium Carbonate-Vitamin D (CALCIUM-VITAMIN D) 600-200 MG-UNIT CAPS Take 1 capsule by mouth daily.       . Cholecalciferol (VITAMIN D) 2000 UNITS tablet Take 2,000 Units by mouth daily.       Marland Kitchen dipyridamole-aspirin (AGGRENOX) 25-200 MG per 12 hr capsule Take 1 capsule by mouth 2 (two) times daily.      Marland Kitchen estrogens, conjugated, (PREMARIN) 0.3 MG tablet Take 0.3 mg by mouth See admin instructions. Take daily for 21 days then do not take for 7 days.      . fluticasone (FLONASE) 50 MCG/ACT nasal spray Place 2 sprays into the nose daily as needed. For seasonal allergies      . lisinopril (PRINIVIL,ZESTRIL) 10 MG tablet Take 10 mg by mouth daily.      . Magnesium 250 MG TABS Take 250 mg  by mouth daily.       . metoprolol tartrate (LOPRESSOR) 25 MG tablet Take 25 mg by mouth 2 (two) times daily.      . Misc Natural Products (GLUCOSAMINE CHONDROITIN ADV PO) Take 1 tablet by mouth daily.       . mupirocin ointment (BACTROBAN) 2 % Place into the nose 2 (two) times daily. For positive PCR      . NON FORMULARY Take 1 tablet by mouth daily. Artro Silium - take 1 tablet daily      . pantoprazole (PROTONIX) 40 MG tablet Take 40 mg by mouth daily.       Marland Kitchen alendronate (FOSAMAX) 70 MG tablet Take 70 mg by mouth every 7 (seven) days. Wednesday; Take with a full glass of water on an empty stomach.      Marland Kitchen amoxicillin (AMOXIL) 500 MG capsule Take 2,000 mg by mouth as needed. 4 hrs prior to dental work      . diphenoxylate-atropine (LOMOTIL) 2.5-0.025 MG per tablet Take 1 tablet by mouth 4 (four) times daily as needed. For  loose stool        Results for orders placed during the hospital encounter of 07/10/11 (from the past 48 hour(s))  CBC     Status: Normal   Collection Time   07/10/11 12:47 PM      Component Value Range Comment   WBC 8.1  4.0 - 10.5 (K/uL)    RBC 4.06  3.87 - 5.11 (MIL/uL)    Hemoglobin 12.5  12.0 - 15.0 (g/dL)    HCT 16.1  09.6 - 04.5 (%)    MCV 97.3  78.0 - 100.0 (fL)    MCH 30.8  26.0 - 34.0 (pg)    MCHC 31.6  30.0 - 36.0 (g/dL)    RDW 40.9  81.1 - 91.4 (%)    Platelets 334  150 - 400 (K/uL)   BASIC METABOLIC PANEL     Status: Abnormal   Collection Time   07/10/11 12:47 PM      Component Value Range Comment   Sodium 135  135 - 145 (mEq/L)    Potassium 4.7  3.5 - 5.1 (mEq/L)    Chloride 99  96 - 112 (mEq/L)    CO2 27  19 - 32 (mEq/L)    Glucose, Bld 91  70 - 99 (mg/dL)    BUN 22  6 - 23 (mg/dL)    Creatinine, Ser 7.82 (*) 0.50 - 1.10 (mg/dL)    Calcium 9.3  8.4 - 10.5 (mg/dL)    GFR calc non Af Amer 33 (*) >90 (mL/min)    GFR calc Af Amer 39 (*) >90 (mL/min)   SURGICAL PCR SCREEN     Status: Abnormal   Collection Time   07/10/11 12:47 PM      Component Value Range Comment   MRSA, PCR NEGATIVE  NEGATIVE     Staphylococcus aureus POSITIVE (*) NEGATIVE     Dg Chest 2 View  07/10/2011  *RADIOLOGY REPORT*  Clinical Data: 78 year old female with hypertension.  Lumbar kyphoplasty.  Chest pain.  CHEST - 2 VIEW  Comparison: 01/30/2009 and earlier.  DXA 05/09/2011.  Findings: Scoliosis.  Multilevel lumbar inferior endplate deformities.  Thoracic levels appears stable.  Slightly lower lung volumes.  Cardiac size and mediastinal contours are within normal limits.  No pneumothorax, pulmonary edema, pleural effusion or acute pulmonary opacity.  IMPRESSION:  1. No acute cardiopulmonary abnormality. 2.  Multilevel lumbar inferior endplate deformities.  Original  Report Authenticated By: Harley Hallmark, M.D.    Review of systems not obtained due to patient factors.  Blood pressure 136/68,  pulse 60, temperature 97.5 F (36.4 C), temperature source Oral, resp. rate 18, SpO2 97.00%.  The patient is awake alert and oriented. There is no facial asymmetry. Her gait is nonantalgic but is quite slow. Her strength is intact. Assessment/Plan Impression is that of an L2 compression fracture and the plan is for  An L2 vertebroplasty.  Reinaldo Meeker, MD 07/11/2011, 12:53 PM

## 2011-07-11 NOTE — Anesthesia Postprocedure Evaluation (Signed)
Anesthesia Post Note  Patient: Brenda Hall  Procedure(s) Performed: Procedure(s) (LRB): VERTEBROPLASTY (N/A)  Anesthesia type: General  Patient location: PACU  Post pain: Pain level controlled and Adequate analgesia  Post assessment: Post-op Vital signs reviewed, Patient's Cardiovascular Status Stable, Respiratory Function Stable, Patent Airway and Pain level controlled  Last Vitals:  Filed Vitals:   07/11/11 1435  BP:   Pulse:   Temp: 36.4 C  Resp:     Post vital signs: Reviewed and stable  Level of consciousness: awake, alert  and oriented  Complications: No apparent anesthesia complications

## 2011-07-11 NOTE — Brief Op Note (Signed)
07/11/2011  2:22 PM  PATIENT:  Brenda Hall  78 y.o. female  PRE-OPERATIVE DIAGNOSIS:  Lumbar Two Fracture  POST-OPERATIVE DIAGNOSIS:  Lumbar Two Fracture  PROCEDURE:  Procedure(s) (LRB): VERTEBROPLASTY (N/A)  SURGEON:  Surgeon(s) and Role:    * Reinaldo Meeker, MD - Primary  PHYSICIAN ASSISTANT:   ASSISTANTS: none   ANESTHESIA:   general  EBL:  Total I/O In: 1000 [I.V.:1000] Out: -   BLOOD ADMINISTERED:none  DRAINS: none   LOCAL MEDICATIONS USED:  MARCAINE     SPECIMEN:  No Specimen  DISPOSITION OF SPECIMEN:  N/A  COUNTS:  YES  TOURNIQUET:  * No tourniquets in log *  DICTATION: .Dragon Dictation  PLAN OF CARE: Discharge to home after PACU  PATIENT DISPOSITION:  PACU - hemodynamically stable.   Delay start of Pharmacological VTE agent (>24hrs) due to surgical blood loss or risk of bleeding: yes

## 2011-07-12 ENCOUNTER — Encounter (HOSPITAL_COMMUNITY): Payer: Self-pay | Admitting: Neurosurgery

## 2011-08-13 DIAGNOSIS — R350 Frequency of micturition: Secondary | ICD-10-CM | POA: Diagnosis not present

## 2011-08-13 DIAGNOSIS — N3941 Urge incontinence: Secondary | ICD-10-CM | POA: Diagnosis not present

## 2011-09-24 DIAGNOSIS — N3941 Urge incontinence: Secondary | ICD-10-CM | POA: Diagnosis not present

## 2011-10-15 DIAGNOSIS — N3941 Urge incontinence: Secondary | ICD-10-CM | POA: Diagnosis not present

## 2011-10-18 ENCOUNTER — Ambulatory Visit (INDEPENDENT_AMBULATORY_CARE_PROVIDER_SITE_OTHER): Payer: Medicare Other | Admitting: Cardiology

## 2011-10-18 ENCOUNTER — Encounter: Payer: Self-pay | Admitting: Cardiology

## 2011-10-18 VITALS — BP 152/83 | HR 93 | Resp 16 | Ht 63.0 in | Wt 165.0 lb

## 2011-10-18 DIAGNOSIS — I10 Essential (primary) hypertension: Secondary | ICD-10-CM

## 2011-10-18 DIAGNOSIS — I2510 Atherosclerotic heart disease of native coronary artery without angina pectoris: Secondary | ICD-10-CM

## 2011-10-18 DIAGNOSIS — R079 Chest pain, unspecified: Secondary | ICD-10-CM

## 2011-10-18 DIAGNOSIS — I252 Old myocardial infarction: Secondary | ICD-10-CM | POA: Diagnosis not present

## 2011-10-18 NOTE — Progress Notes (Signed)
HPI [name] comes in today for evaluation and management of her coronary artery disease and history of PCI.  She's had no significant chest pain or angina. She recalls taking one aspirin for some chest pain and also one nitroglycerin since we last evaluated her.  She's been troubled by low back pain and is really not been swimming or exercising. She recently had surgery and this is improved. She is anxious to get back into her swimming.  I note she is on both Aggrenox and aspirin.  She has nonobstructive carotid disease being treated medically. Last ultrasound was last year  Past Medical History  Diagnosis Date  . Coronary atherosclerosis of unspecified type of vessel, native or graft   . Other diseases of lung, not elsewhere classified     lung nodule  . DJD (degenerative joint disease) of knee   . IBS (irritable bowel syndrome)   . Polymyalgia rheumatica   . HTN (hypertension)   . Chest pain, unspecified   . History of TIA (transient ischemic attack)   . Myocardial infarction     5 yrs ago     dr Armani Gawlik  cardiac    Current Outpatient Prescriptions  Medication Sig Dispense Refill  . alendronate (FOSAMAX) 70 MG tablet Take 70 mg by mouth every 7 (seven) days. Wednesday; Take with a full glass of water on an empty stomach.      Marland Kitchen atorvastatin (LIPITOR) 40 MG tablet Take 40 mg by mouth daily.      . Calcium Carbonate-Vitamin D (CALCIUM-VITAMIN D) 600-200 MG-UNIT CAPS Take 1 capsule by mouth daily.       . Cholecalciferol (VITAMIN D) 2000 UNITS tablet Take 2,000 Units by mouth daily.       . diphenoxylate-atropine (LOMOTIL) 2.5-0.025 MG per tablet Take 1 tablet by mouth 4 (four) times daily as needed. For loose stool      . dipyridamole-aspirin (AGGRENOX) 25-200 MG per 12 hr capsule Take 1 capsule by mouth 2 (two) times daily.      Marland Kitchen estrogens, conjugated, (PREMARIN) 0.3 MG tablet Take 0.3 mg by mouth See admin instructions. Take daily for 21 days then do not take for 7 days.       . fluticasone (FLONASE) 50 MCG/ACT nasal spray Place 2 sprays into the nose daily as needed. For seasonal allergies      . glucosamine-chondroitin 500-400 MG tablet Take 1 tablet by mouth 3 (three) times daily.      Marland Kitchen lisinopril (PRINIVIL,ZESTRIL) 10 MG tablet Take 10 mg by mouth daily.      . Magnesium 250 MG TABS Take 250 mg by mouth daily.       . metoprolol tartrate (LOPRESSOR) 25 MG tablet Take 25 mg by mouth 2 (two) times daily.      . naproxen sodium (ANAPROX) 220 MG tablet Take 220 mg by mouth as needed.      . pantoprazole (PROTONIX) 40 MG tablet Take 40 mg by mouth daily.       . Zinc 50 MG CAPS Take 50 mg by mouth daily.        Allergies  Allergen Reactions  . Epinephrine Other (See Comments)    During dental procedure-caused facial swelling at injection site  . Clopidogrel Bisulfate Itching and Rash    No family history on file.  History   Social History  . Marital Status: Married    Spouse Name: N/A    Number of Children: N/A  . Years of Education: N/A  Occupational History  . Not on file.   Social History Main Topics  . Smoking status: Never Smoker   . Smokeless tobacco: Never Used   Comment: tobacco use - no  . Alcohol Use: No  . Drug Use: No  . Sexually Active: Not on file   Other Topics Concern  . Not on file   Social History Narrative   Married, retired. 3 children. Regular exercise -yes; hobbies = swimming.    ROS ALL NEGATIVE EXCEPT THOSE NOTED IN HPI  PE  General Appearance: well developed, well nourished in no acute distress, obese HEENT: symmetrical face, PERRLA, good dentition  Neck: no JVD, thyromegaly, or adenopathy, trachea midline Chest: symmetric without deformity Cardiac: PMI non-displaced, RRR, normal S1, S2, no gallop or murmur Lung: clear to ausculation and percussion Vascular: all pulses full without bruits  Abdominal: nondistended, nontender, good bowel sounds, no HSM, no bruits Extremities: no cyanosis, clubbing or  edema, no sign of DVT, no varicosities  Skin: normal color, no rashes Neuro: alert and oriented x 3, non-focal Pysch: normal affect  EKG  BMET    Component Value Date/Time   NA 135 07/10/2011 1247   K 4.7 07/10/2011 1247   CL 99 07/10/2011 1247   CO2 27 07/10/2011 1247   GLUCOSE 91 07/10/2011 1247   BUN 22 07/10/2011 1247   CREATININE 1.46* 07/10/2011 1247   CALCIUM 9.3 07/10/2011 1247   GFRNONAA 33* 07/10/2011 1247   GFRAA 39* 07/10/2011 1247    Lipid Panel  No results found for this basename: chol, trig, hdl, cholhdl, vldl, ldlcalc    CBC    Component Value Date/Time   WBC 8.1 07/10/2011 1247   RBC 4.06 07/10/2011 1247   HGB 12.5 07/10/2011 1247   HCT 39.5 07/10/2011 1247   PLT 334 07/10/2011 1247   MCV 97.3 07/10/2011 1247   MCH 30.8 07/10/2011 1247   MCHC 31.6 07/10/2011 1247   RDW 13.1 07/10/2011 1247   LYMPHSABS 1.8 01/30/2009 1138   MONOABS 0.4 01/30/2009 1138   EOSABS 0.2 01/30/2009 1138   BASOSABS 0.0 01/30/2009 1138

## 2011-10-18 NOTE — Assessment & Plan Note (Signed)
Blood pressures elevated in the office today. She checks it at home a regular basis and it averages 130/70. No change in treatment.

## 2011-10-18 NOTE — Assessment & Plan Note (Signed)
Noncardiac

## 2011-10-18 NOTE — Assessment & Plan Note (Signed)
Stable. Discontinue aspirin and continue Aggrenox.

## 2011-10-18 NOTE — Patient Instructions (Addendum)
**Note De-Identified  Obfuscation** Your physician has recommended you make the following change in your medication: Stop taking Aspirin  Your physician recommends that you schedule a follow-up appointment in: 1 year

## 2011-10-29 DIAGNOSIS — I10 Essential (primary) hypertension: Secondary | ICD-10-CM | POA: Diagnosis not present

## 2011-10-29 DIAGNOSIS — I259 Chronic ischemic heart disease, unspecified: Secondary | ICD-10-CM | POA: Diagnosis not present

## 2011-10-29 DIAGNOSIS — E785 Hyperlipidemia, unspecified: Secondary | ICD-10-CM | POA: Diagnosis not present

## 2011-10-29 DIAGNOSIS — G4730 Sleep apnea, unspecified: Secondary | ICD-10-CM | POA: Diagnosis not present

## 2011-10-30 DIAGNOSIS — E782 Mixed hyperlipidemia: Secondary | ICD-10-CM | POA: Diagnosis not present

## 2011-10-30 DIAGNOSIS — R5381 Other malaise: Secondary | ICD-10-CM | POA: Diagnosis not present

## 2011-10-30 DIAGNOSIS — Z79899 Other long term (current) drug therapy: Secondary | ICD-10-CM | POA: Diagnosis not present

## 2011-10-30 DIAGNOSIS — M810 Age-related osteoporosis without current pathological fracture: Secondary | ICD-10-CM | POA: Diagnosis not present

## 2011-10-30 DIAGNOSIS — R5383 Other fatigue: Secondary | ICD-10-CM | POA: Diagnosis not present

## 2011-11-05 DIAGNOSIS — R350 Frequency of micturition: Secondary | ICD-10-CM | POA: Diagnosis not present

## 2011-11-05 DIAGNOSIS — N3941 Urge incontinence: Secondary | ICD-10-CM | POA: Diagnosis not present

## 2011-11-26 ENCOUNTER — Other Ambulatory Visit: Payer: Self-pay | Admitting: Family Medicine

## 2011-11-26 DIAGNOSIS — Z1231 Encounter for screening mammogram for malignant neoplasm of breast: Secondary | ICD-10-CM

## 2011-12-03 ENCOUNTER — Other Ambulatory Visit: Payer: Self-pay | Admitting: Cardiology

## 2011-12-04 NOTE — Telephone Encounter (Signed)
..   Requested Prescriptions   Pending Prescriptions Disp Refills  . metoprolol tartrate (LOPRESSOR) 25 MG tablet [Pharmacy Med Name: METOPROLOL TARTRATE 25MG  TAB] 60 tablet 6    Sig: TAKE ONE TABLET TWICE DAILY  . lisinopril (PRINIVIL,ZESTRIL) 10 MG tablet [Pharmacy Med Name: LISINOPRIL 10MG  TAB] 30 tablet 6    Sig: TAKE ONE (1) TABLET BY MOUTH EVERY      DAY

## 2011-12-06 DIAGNOSIS — N3941 Urge incontinence: Secondary | ICD-10-CM | POA: Diagnosis not present

## 2011-12-06 DIAGNOSIS — R350 Frequency of micturition: Secondary | ICD-10-CM | POA: Diagnosis not present

## 2011-12-10 ENCOUNTER — Ambulatory Visit
Admission: RE | Admit: 2011-12-10 | Discharge: 2011-12-10 | Disposition: A | Discharge status: Home / Self Care | Payer: Medicare Other | Source: Ambulatory Visit | Attending: Family Medicine | Admitting: Family Medicine

## 2011-12-10 DIAGNOSIS — Z1231 Encounter for screening mammogram for malignant neoplasm of breast: Secondary | ICD-10-CM

## 2011-12-11 DIAGNOSIS — M545 Low back pain: Secondary | ICD-10-CM | POA: Diagnosis not present

## 2011-12-11 DIAGNOSIS — M19049 Primary osteoarthritis, unspecified hand: Secondary | ICD-10-CM | POA: Diagnosis not present

## 2011-12-11 DIAGNOSIS — M25569 Pain in unspecified knee: Secondary | ICD-10-CM | POA: Diagnosis not present

## 2011-12-24 DIAGNOSIS — N3941 Urge incontinence: Secondary | ICD-10-CM | POA: Diagnosis not present

## 2011-12-24 DIAGNOSIS — R350 Frequency of micturition: Secondary | ICD-10-CM | POA: Diagnosis not present

## 2012-01-02 ENCOUNTER — Encounter: Payer: Self-pay | Admitting: Vascular Surgery

## 2012-01-07 DIAGNOSIS — L820 Inflamed seborrheic keratosis: Secondary | ICD-10-CM | POA: Diagnosis not present

## 2012-01-07 DIAGNOSIS — D485 Neoplasm of uncertain behavior of skin: Secondary | ICD-10-CM | POA: Diagnosis not present

## 2012-01-07 DIAGNOSIS — C44319 Basal cell carcinoma of skin of other parts of face: Secondary | ICD-10-CM | POA: Diagnosis not present

## 2012-01-14 DIAGNOSIS — N3941 Urge incontinence: Secondary | ICD-10-CM | POA: Diagnosis not present

## 2012-01-14 DIAGNOSIS — R350 Frequency of micturition: Secondary | ICD-10-CM | POA: Diagnosis not present

## 2012-01-31 ENCOUNTER — Other Ambulatory Visit: Payer: Self-pay | Admitting: *Deleted

## 2012-01-31 DIAGNOSIS — I6529 Occlusion and stenosis of unspecified carotid artery: Secondary | ICD-10-CM

## 2012-02-04 DIAGNOSIS — N3941 Urge incontinence: Secondary | ICD-10-CM | POA: Diagnosis not present

## 2012-02-04 DIAGNOSIS — R350 Frequency of micturition: Secondary | ICD-10-CM | POA: Diagnosis not present

## 2012-02-06 ENCOUNTER — Encounter: Payer: Self-pay | Admitting: Neurosurgery

## 2012-02-07 ENCOUNTER — Ambulatory Visit (INDEPENDENT_AMBULATORY_CARE_PROVIDER_SITE_OTHER): Payer: Medicare Other | Admitting: Vascular Surgery

## 2012-02-07 ENCOUNTER — Ambulatory Visit (INDEPENDENT_AMBULATORY_CARE_PROVIDER_SITE_OTHER): Payer: Medicare Other | Admitting: Neurosurgery

## 2012-02-07 ENCOUNTER — Encounter: Payer: Self-pay | Admitting: Neurosurgery

## 2012-02-07 VITALS — BP 158/94 | HR 70 | Resp 78 | Ht 63.0 in | Wt 162.0 lb

## 2012-02-07 DIAGNOSIS — Z48812 Encounter for surgical aftercare following surgery on the circulatory system: Secondary | ICD-10-CM

## 2012-02-07 DIAGNOSIS — I6529 Occlusion and stenosis of unspecified carotid artery: Secondary | ICD-10-CM

## 2012-02-07 NOTE — Progress Notes (Signed)
[facility] Carotid Office Note  CC: Carotid surveillance Referring Physician: Imogene Burn  History of Present Illness: 78 year old female patient of Dr. Imogene Burn with no carotid intervention history. The patient denies signs or symptoms of CVA, TIA, amaurosis fugax or any neural deficit.  Past Medical History  Diagnosis Date  . Coronary atherosclerosis of unspecified type of vessel, native or graft   . Other diseases of lung, not elsewhere classified     lung nodule  . DJD (degenerative joint disease) of knee   . IBS (irritable bowel syndrome)   . Polymyalgia rheumatica   . HTN (hypertension)   . Chest pain, unspecified   . History of TIA (transient ischemic attack)   . Myocardial infarction     5 yrs ago     dr wall  cardiac    ROS: [x]  Positive   [ ]  Denies    General: [ ]  Weight loss, [ ]  Fever, [ ]  chills Neurologic: [ ]  Dizziness, [ ]  Blackouts, [ ]  Seizure [ ]  Stroke, [ ]  "Mini stroke", [ ]  Slurred speech, [ ]  Temporary blindness; [ ]  weakness in arms or legs, [ ]  Hoarseness Cardiac: [ ]  Chest pain/pressure, [ ]  Shortness of breath at rest [ ]  Shortness of breath with exertion, [ ]  Atrial fibrillation or irregular heartbeat Vascular: [ ]  Pain in legs with walking, [ ]  Pain in legs at rest, [ ]  Pain in legs at night,  [ ]  Non-healing ulcer, [ ]  Blood clot in vein/DVT,   Pulmonary: [ ]  Home oxygen, [ ]  Productive cough, [ ]  Coughing up blood, [ ]  Asthma,  [ ]  Wheezing Musculoskeletal:  [ ]  Arthritis, [ ]  Low back pain, [ ]  Joint pain Hematologic: [ ]  Easy Bruising, [ ]  Anemia; [ ]  Hepatitis Gastrointestinal: [ ]  Blood in stool, [ ]  Gastroesophageal Reflux/heartburn, [ ]  Trouble swallowing Urinary: [ ]  chronic Kidney disease, [ ]  on HD - [ ]  MWF or [ ]  TTHS, [ ]  Burning with urination, [ ]  Difficulty urinating Skin: [ ]  Rashes, [ ]  Wounds Psychological: [ ]  Anxiety, [ ]  Depression   Social History History  Substance Use Topics  . Smoking status:  Never Smoker   . Smokeless tobacco: Never Used   Comment: tobacco use - no  . Alcohol Use: No    Family History No family history on file.  Allergies  Allergen Reactions  . Epinephrine Other (See Comments)    During dental procedure-caused facial swelling at injection site  . Clopidogrel Bisulfate Itching and Rash    Current Outpatient Prescriptions  Medication Sig Dispense Refill  . alendronate (FOSAMAX) 70 MG tablet Take 70 mg by mouth every 7 (seven) days. Wednesday; Take with a full glass of water on an empty stomach.      Marland Kitchen atorvastatin (LIPITOR) 40 MG tablet Take 40 mg by mouth daily.      . Calcium Carbonate-Vitamin D (CALCIUM-VITAMIN D) 600-200 MG-UNIT CAPS Take 1 capsule by mouth daily.       . Cholecalciferol (VITAMIN D) 2000 UNITS tablet Take 2,000 Units by mouth daily.       . diphenoxylate-atropine (LOMOTIL) 2.5-0.025 MG per tablet Take 1 tablet by mouth 4 (four) times daily as needed. For loose stool      . dipyridamole-aspirin (AGGRENOX) 25-200 MG per 12 hr capsule Take 1 capsule by mouth 2 (two) times daily.      Marland Kitchen estrogens, conjugated, (PREMARIN) 0.3 MG tablet Take 0.3 mg by mouth  See admin instructions. Take daily for 21 days then do not take for 7 days.      . fluticasone (FLONASE) 50 MCG/ACT nasal spray Place 2 sprays into the nose daily as needed. For seasonal allergies      . glucosamine-chondroitin 500-400 MG tablet Take 1 tablet by mouth 3 (three) times daily.      Marland Kitchen lisinopril (PRINIVIL,ZESTRIL) 10 MG tablet Take 10 mg by mouth daily.      Marland Kitchen lisinopril (PRINIVIL,ZESTRIL) 10 MG tablet TAKE ONE (1) TABLET BY MOUTH EVERY      DAY  30 tablet  6  . Magnesium 250 MG TABS Take 250 mg by mouth daily.       . metoprolol tartrate (LOPRESSOR) 25 MG tablet TAKE ONE TABLET TWICE DAILY  60 tablet  6  . naproxen sodium (ANAPROX) 220 MG tablet Take 220 mg by mouth as needed.      . pantoprazole (PROTONIX) 40 MG tablet Take 40 mg by mouth daily.       . Zinc 50 MG CAPS Take  50 mg by mouth daily.        Physical Examination  There were no vitals filed for this visit.  There is no height or weight on file to calculate BMI.  General:  WDWN in NAD Gait: Normal HEENT: WNL Eyes: Pupils equal Pulmonary: normal non-labored breathing , without Rales, rhonchi,  wheezing Cardiac: RRR, without  Murmurs, rubs or gallops; Abdomen: soft, NT, no masses Skin: no rashes, ulcers noted  Vascular Exam Pulses: 3+ radial pulses bilaterally Carotid bruits: Carotid pulses to auscultation no bruits are heard Extremities without ischemic changes, no Gangrene , no cellulitis; no open wounds;  Musculoskeletal: no muscle wasting or atrophy   Neurologic: A&O X 3; Appropriate Affect ; SENSATION: normal; MOTOR FUNCTION:  moving all extremities equally. Speech is fluent/normal  Non-Invasive Vascular Imaging CAROTID DUPLEX 02/07/2012  Right ICA 20 - 39 % stenosis Left ICA 20 - 39 % stenosis   ASSESSMENT/PLAN: Asymptomatic patient with minimal bilateral carotid stenosis. The patient will followup in one year with repeat carotid duplex. The patient's questions were encouraged and answered, she is in agreement with this plan.  Lauree Chandler ANP   Clinic MD: Imogene Burn

## 2012-02-07 NOTE — Progress Notes (Signed)
Carotid duplex performed @ [facility] 02/07/2012 

## 2012-02-10 NOTE — Addendum Note (Signed)
Addended by: Sharee Pimple on: 02/10/2012 08:18 AM   Modules accepted: Orders

## 2012-02-12 DIAGNOSIS — H2510 Age-related nuclear cataract, unspecified eye: Secondary | ICD-10-CM | POA: Diagnosis not present

## 2012-02-12 DIAGNOSIS — H43399 Other vitreous opacities, unspecified eye: Secondary | ICD-10-CM | POA: Diagnosis not present

## 2012-02-18 DIAGNOSIS — C44319 Basal cell carcinoma of skin of other parts of face: Secondary | ICD-10-CM | POA: Diagnosis not present

## 2012-02-18 DIAGNOSIS — L905 Scar conditions and fibrosis of skin: Secondary | ICD-10-CM | POA: Diagnosis not present

## 2012-02-18 DIAGNOSIS — Z85828 Personal history of other malignant neoplasm of skin: Secondary | ICD-10-CM | POA: Diagnosis not present

## 2012-03-03 DIAGNOSIS — N3941 Urge incontinence: Secondary | ICD-10-CM | POA: Diagnosis not present

## 2012-03-03 DIAGNOSIS — R350 Frequency of micturition: Secondary | ICD-10-CM | POA: Diagnosis not present

## 2012-03-24 DIAGNOSIS — R350 Frequency of micturition: Secondary | ICD-10-CM | POA: Diagnosis not present

## 2012-03-24 DIAGNOSIS — N3941 Urge incontinence: Secondary | ICD-10-CM | POA: Diagnosis not present

## 2012-04-17 DIAGNOSIS — M810 Age-related osteoporosis without current pathological fracture: Secondary | ICD-10-CM | POA: Diagnosis not present

## 2012-04-17 DIAGNOSIS — I6789 Other cerebrovascular disease: Secondary | ICD-10-CM | POA: Diagnosis not present

## 2012-04-17 DIAGNOSIS — E785 Hyperlipidemia, unspecified: Secondary | ICD-10-CM | POA: Diagnosis not present

## 2012-04-17 DIAGNOSIS — I259 Chronic ischemic heart disease, unspecified: Secondary | ICD-10-CM | POA: Diagnosis not present

## 2012-04-20 DIAGNOSIS — E785 Hyperlipidemia, unspecified: Secondary | ICD-10-CM | POA: Diagnosis not present

## 2012-04-20 DIAGNOSIS — Z79899 Other long term (current) drug therapy: Secondary | ICD-10-CM | POA: Diagnosis not present

## 2012-04-21 DIAGNOSIS — N3941 Urge incontinence: Secondary | ICD-10-CM | POA: Diagnosis not present

## 2012-04-21 DIAGNOSIS — R350 Frequency of micturition: Secondary | ICD-10-CM | POA: Diagnosis not present

## 2012-04-22 DIAGNOSIS — C4490 Unspecified malignant neoplasm of skin, unspecified: Secondary | ICD-10-CM

## 2012-04-22 HISTORY — DX: Unspecified malignant neoplasm of skin, unspecified: C44.90

## 2012-04-28 DIAGNOSIS — C44319 Basal cell carcinoma of skin of other parts of face: Secondary | ICD-10-CM | POA: Diagnosis not present

## 2012-05-12 DIAGNOSIS — R350 Frequency of micturition: Secondary | ICD-10-CM | POA: Diagnosis not present

## 2012-05-12 DIAGNOSIS — N3941 Urge incontinence: Secondary | ICD-10-CM | POA: Diagnosis not present

## 2012-05-12 DIAGNOSIS — M949 Disorder of cartilage, unspecified: Secondary | ICD-10-CM | POA: Diagnosis not present

## 2012-05-18 DIAGNOSIS — Z0000 Encounter for general adult medical examination without abnormal findings: Secondary | ICD-10-CM | POA: Diagnosis not present

## 2012-05-21 DIAGNOSIS — M810 Age-related osteoporosis without current pathological fracture: Secondary | ICD-10-CM | POA: Diagnosis not present

## 2012-05-21 DIAGNOSIS — M19079 Primary osteoarthritis, unspecified ankle and foot: Secondary | ICD-10-CM | POA: Diagnosis not present

## 2012-05-21 DIAGNOSIS — M5137 Other intervertebral disc degeneration, lumbosacral region: Secondary | ICD-10-CM | POA: Diagnosis not present

## 2012-05-21 DIAGNOSIS — M19049 Primary osteoarthritis, unspecified hand: Secondary | ICD-10-CM | POA: Diagnosis not present

## 2012-06-02 DIAGNOSIS — N3941 Urge incontinence: Secondary | ICD-10-CM | POA: Diagnosis not present

## 2012-07-07 ENCOUNTER — Telehealth: Payer: Self-pay | Admitting: Family Medicine

## 2012-07-07 NOTE — Telephone Encounter (Signed)
Aggrenox is brand name and cost a lot of money.  Insurance wants patient to get generic.  Would you be willing to put patient on Ticlopidine?  (This is what the pharmist at [facility] his name is Acupuncturist.  Please let patient know either way.  Test completed 04/21/2012-Note written on bottom and the patient said she can not read what is written.  Can we let her know what this states?

## 2012-07-07 NOTE — Telephone Encounter (Signed)
Aggrenox is likely more effective to reduce future risk of stroke, so stay on  Yes, may share prior note

## 2012-07-07 NOTE — Telephone Encounter (Signed)
Forward to MD for review and further recommendations

## 2012-07-07 NOTE — Telephone Encounter (Signed)
Advised patient to continue Aggrenox as recommended by MD. Read prior lab note in paper chart to patient dated 04/21/2012. Patient verbalized understanding and agreed with instructions.

## 2012-07-08 DIAGNOSIS — D485 Neoplasm of uncertain behavior of skin: Secondary | ICD-10-CM | POA: Diagnosis not present

## 2012-07-08 DIAGNOSIS — L259 Unspecified contact dermatitis, unspecified cause: Secondary | ICD-10-CM | POA: Diagnosis not present

## 2012-07-14 DIAGNOSIS — R350 Frequency of micturition: Secondary | ICD-10-CM | POA: Diagnosis not present

## 2012-07-14 DIAGNOSIS — N3941 Urge incontinence: Secondary | ICD-10-CM | POA: Diagnosis not present

## 2012-07-22 ENCOUNTER — Other Ambulatory Visit: Payer: Self-pay | Admitting: *Deleted

## 2012-07-22 MED ORDER — ESTROGENS CONJUGATED 0.3 MG PO TABS: 0.3000 mg | ORAL_TABLET | Freq: Every day | ORAL | Status: DC

## 2012-07-22 MED ORDER — ASPIRIN-DIPYRIDAMOLE ER 25-200 MG PO CP12: 1.0000 | ORAL_CAPSULE | Freq: Two times a day (BID) | ORAL | Status: DC

## 2012-07-28 ENCOUNTER — Ambulatory Visit (INDEPENDENT_AMBULATORY_CARE_PROVIDER_SITE_OTHER): Payer: Medicare Other | Admitting: Family Medicine

## 2012-07-28 ENCOUNTER — Encounter: Payer: Self-pay | Admitting: Family Medicine

## 2012-07-28 VITALS — BP 152/90 | Temp 97.5°F | Ht 63.0 in | Wt 166.2 lb

## 2012-07-28 DIAGNOSIS — J019 Acute sinusitis, unspecified: Secondary | ICD-10-CM

## 2012-07-28 MED ORDER — AZITHROMYCIN 250 MG PO TABS: ORAL_TABLET | ORAL | Status: AC

## 2012-07-28 NOTE — Patient Instructions (Signed)

## 2012-07-28 NOTE — Progress Notes (Signed)
  Subjective:    Patient ID: Brenda Hall, female    DOB: [birth year], 79 y.o.   MRN: [id number]  Cough This is a new problem. The current episode started in the past 7 days. Associated symptoms include nasal congestion, postnasal drip and rhinorrhea. Pertinent negatives include no chills, ear congestion, fever, hemoptysis, sore throat, shortness of breath, weight loss or wheezing. Nothing aggravates the symptoms. Treatments tried: sinus medicine. The treatment provided no relief.      Review of Systems  Constitutional: Negative for fever, chills and weight loss.  HENT: Positive for rhinorrhea and postnasal drip. Negative for sore throat.   Respiratory: Positive for cough. Negative for hemoptysis, shortness of breath and wheezing.        Objective:   Physical Exam On exam eardrums normal throat is normal mild sinus tenderness neck is supple lungs are clear heart is regular extremities no edema skin warm dry patient not toxic       Assessment & Plan:  Acute sinusitis-Zithromax next 5 days should gradually get better warning signs were discussed call us if any problems

## 2012-08-04 DIAGNOSIS — N3941 Urge incontinence: Secondary | ICD-10-CM | POA: Diagnosis not present

## 2012-08-04 DIAGNOSIS — R350 Frequency of micturition: Secondary | ICD-10-CM | POA: Diagnosis not present

## 2012-08-08 ENCOUNTER — Other Ambulatory Visit: Payer: Self-pay | Admitting: Cardiology

## 2012-08-15 ENCOUNTER — Other Ambulatory Visit: Payer: Self-pay | Admitting: Family Medicine

## 2012-08-19 ENCOUNTER — Telehealth: Payer: Self-pay | Admitting: Family Medicine

## 2012-08-19 MED ORDER — ESTROGENS CONJUGATED 0.3 MG PO TABS: 0.3000 mg | ORAL_TABLET | Freq: Every day | ORAL | Status: DC

## 2012-08-19 NOTE — Telephone Encounter (Signed)
Patient says that Brenda Hall has shorted her on her generic Premarin because most people take the med for 21 days and skip 7, but Rudie is supposed to take it everyday and not skip any.  So she needs [place] to call [facility] at [phone] and let them know that she needs a prescription for 90 days and 90 pills.

## 2012-08-19 NOTE — Telephone Encounter (Signed)
Brenda Hall, I did not refill this at [facility].  Do you want to continue?

## 2012-08-19 NOTE — Telephone Encounter (Signed)
90 with 3 ref. One qd. Pt aware increases risk of complications

## 2012-08-19 NOTE — Telephone Encounter (Signed)
Pt medication list states she is to skip 7 days on the Premarin, pt message says she is to take everyday with no skipping is "shorted" on her 90 day supply per pharmacy

## 2012-08-20 ENCOUNTER — Other Ambulatory Visit: Payer: Self-pay | Admitting: *Deleted

## 2012-08-21 ENCOUNTER — Other Ambulatory Visit: Payer: Self-pay | Admitting: *Deleted

## 2012-08-21 MED ORDER — ESTROGENS CONJUGATED 0.3 MG PO TABS: ORAL_TABLET | ORAL | Status: DC

## 2012-08-22 ENCOUNTER — Encounter: Payer: Self-pay | Admitting: *Deleted

## 2012-08-29 ENCOUNTER — Other Ambulatory Visit: Payer: Self-pay | Admitting: Family Medicine

## 2012-09-01 DIAGNOSIS — R350 Frequency of micturition: Secondary | ICD-10-CM | POA: Diagnosis not present

## 2012-09-01 DIAGNOSIS — N3941 Urge incontinence: Secondary | ICD-10-CM | POA: Diagnosis not present

## 2012-09-06 ENCOUNTER — Other Ambulatory Visit: Payer: Self-pay | Admitting: Cardiology

## 2012-09-21 DIAGNOSIS — L299 Pruritus, unspecified: Secondary | ICD-10-CM | POA: Diagnosis not present

## 2012-09-22 DIAGNOSIS — M999 Biomechanical lesion, unspecified: Secondary | ICD-10-CM | POA: Diagnosis not present

## 2012-09-22 DIAGNOSIS — M545 Low back pain: Secondary | ICD-10-CM | POA: Diagnosis not present

## 2012-09-26 ENCOUNTER — Other Ambulatory Visit: Payer: Self-pay | Admitting: Family Medicine

## 2012-09-26 DIAGNOSIS — M545 Low back pain: Secondary | ICD-10-CM | POA: Diagnosis not present

## 2012-09-26 DIAGNOSIS — M999 Biomechanical lesion, unspecified: Secondary | ICD-10-CM | POA: Diagnosis not present

## 2012-09-28 DIAGNOSIS — M545 Low back pain: Secondary | ICD-10-CM | POA: Diagnosis not present

## 2012-09-28 DIAGNOSIS — M999 Biomechanical lesion, unspecified: Secondary | ICD-10-CM | POA: Diagnosis not present

## 2012-09-28 NOTE — Telephone Encounter (Signed)
wis 3 ref

## 2012-09-29 DIAGNOSIS — M999 Biomechanical lesion, unspecified: Secondary | ICD-10-CM | POA: Diagnosis not present

## 2012-09-29 DIAGNOSIS — M545 Low back pain: Secondary | ICD-10-CM | POA: Diagnosis not present

## 2012-10-02 ENCOUNTER — Ambulatory Visit: Payer: Medicare Other | Admitting: Cardiology

## 2012-10-06 DIAGNOSIS — R350 Frequency of micturition: Secondary | ICD-10-CM | POA: Diagnosis not present

## 2012-10-06 DIAGNOSIS — M545 Low back pain: Secondary | ICD-10-CM | POA: Diagnosis not present

## 2012-10-06 DIAGNOSIS — N3941 Urge incontinence: Secondary | ICD-10-CM | POA: Diagnosis not present

## 2012-10-06 DIAGNOSIS — M999 Biomechanical lesion, unspecified: Secondary | ICD-10-CM | POA: Diagnosis not present

## 2012-10-07 DIAGNOSIS — M999 Biomechanical lesion, unspecified: Secondary | ICD-10-CM | POA: Diagnosis not present

## 2012-10-07 DIAGNOSIS — M545 Low back pain: Secondary | ICD-10-CM | POA: Diagnosis not present

## 2012-10-08 DIAGNOSIS — M545 Low back pain: Secondary | ICD-10-CM | POA: Diagnosis not present

## 2012-10-08 DIAGNOSIS — M999 Biomechanical lesion, unspecified: Secondary | ICD-10-CM | POA: Diagnosis not present

## 2012-10-09 ENCOUNTER — Ambulatory Visit (INDEPENDENT_AMBULATORY_CARE_PROVIDER_SITE_OTHER): Payer: Medicare Other | Admitting: Cardiology

## 2012-10-09 ENCOUNTER — Encounter: Payer: Self-pay | Admitting: Cardiology

## 2012-10-09 VITALS — BP 143/72 | HR 87 | Ht 62.0 in | Wt 160.0 lb

## 2012-10-09 DIAGNOSIS — I2510 Atherosclerotic heart disease of native coronary artery without angina pectoris: Secondary | ICD-10-CM | POA: Diagnosis not present

## 2012-10-09 DIAGNOSIS — I252 Old myocardial infarction: Secondary | ICD-10-CM

## 2012-10-09 DIAGNOSIS — I10 Essential (primary) hypertension: Secondary | ICD-10-CM | POA: Diagnosis not present

## 2012-10-09 DIAGNOSIS — I6529 Occlusion and stenosis of unspecified carotid artery: Secondary | ICD-10-CM

## 2012-10-09 NOTE — Assessment & Plan Note (Signed)
Stable. Continue secondary preventative therapy. Followup in one year with Dr. Diona Browner.

## 2012-10-09 NOTE — Patient Instructions (Addendum)
Your physician recommends that you continue on your current medications as directed. Please refer to the Current Medication list given to you today.  Your physician wants you to follow-up in: 1 year with Dr. Diona Browner. You will receive a reminder letter in the mail two months in advance. If you don't receive a letter, please call our office to schedule the follow-up appointment.

## 2012-10-09 NOTE — Progress Notes (Signed)
HPI [name] returns today for evaluation and management of her history of myocardial infarction, left circumflex PCI, and history of atypical chest pain. She's had a lot of back problems and also a basal cell carcinoma removed from her nose with extensive grafting since I last saw her. Her heart has done well with no angina or ischemic symptoms.  She's very compliant with her medications.  Past Medical History  Diagnosis Date  . Coronary atherosclerosis of unspecified type of vessel, native or graft   . Other diseases of lung, not elsewhere classified     lung nodule  . DJD (degenerative joint disease) of knee   . IBS (irritable bowel syndrome)   . Polymyalgia rheumatica   . HTN (hypertension)   . Chest pain, unspecified   . History of TIA (transient ischemic attack)   . Myocardial infarction     5 yrs ago     dr Rasheedah Reis  cardiac  . Cancer 1988 and  2009    BCC nose and  Melanoma back  . Anemia   . CHF (congestive heart failure)   . Stroke   . Osteoporosis   . Carotid stenosis     mild  . Chronic back pain     Current Outpatient Prescriptions  Medication Sig Dispense Refill  . alendronate (FOSAMAX) 70 MG tablet Take 70 mg by mouth every 7 (seven) days. Wednesday; Take with a full glass of water on an empty stomach.      Marland Kitchen atorvastatin (LIPITOR) 80 MG tablet TAKE ONE TABLET AT BEDTIME  30 tablet  1  . Calcium Carbonate-Vitamin D (CALCIUM-VITAMIN D) 600-200 MG-UNIT CAPS Take 1 capsule by mouth daily.       . Cholecalciferol (VITAMIN D) 2000 UNITS tablet Take 2,000 Units by mouth daily.       . diphenoxylate-atropine (LOMOTIL) 2.5-0.025 MG per tablet TAKE 1 TABLET EVERY 4-6 HOURS AS NEEDED FOR DIARRHEA  36 tablet  3  . dipyridamole-aspirin (AGGRENOX) 200-25 MG per 12 hr capsule Take 1 capsule by mouth 2 (two) times daily.  180 capsule  4  . estrogens, conjugated, (PREMARIN) 0.3 MG tablet Take daily  90 tablet  3  . fluticasone (FLONASE) 50 MCG/ACT nasal spray USE 2 SPRAYS IN  EACH NOSTRIL ONCE DAILY AS NEEDED  16 g  2  . glucosamine-chondroitin 500-400 MG tablet Take 1 tablet by mouth 3 (three) times daily.      Marland Kitchen lisinopril (PRINIVIL,ZESTRIL) 10 MG tablet TAKE ONE (1) TABLET BY MOUTH EVERY DAY  30 tablet  2  . Magnesium 250 MG TABS Take 250 mg by mouth daily.       . metoprolol tartrate (LOPRESSOR) 25 MG tablet TAKE ONE TABLET TWICE DAILY  60 tablet  2  . naproxen sodium (ANAPROX) 220 MG tablet Take 220 mg by mouth as needed.      Marland Kitchen NITROSTAT 0.4 MG SL tablet PLACE 1 TABLET UNDER TONGUE EVERY 5 MINUTES FOR 3 DOSES AS NEEDED.  25 tablet  3  . pantoprazole (PROTONIX) 40 MG tablet Take 40 mg by mouth daily.       Marland Kitchen Specialty Vitamins Products (VITAMINS FOR THE HAIR PO) Take 3,000 mcg by mouth.      . Zinc 50 MG CAPS Take 50 mg by mouth daily.       No current facility-administered medications for this visit.    Allergies  Allergen Reactions  . Epinephrine Other (See Comments)    During dental procedure-caused facial swelling at  injection site  . Nsaids   . Clopidogrel Bisulfate Itching and Rash    Family History  Problem Relation Age of Onset  . Heart disease Father   . Cancer Mother     History   Social History  . Marital Status: Married    Spouse Name: N/A    Number of Children: N/A  . Years of Education: N/A   Occupational History  . Not on file.   Social History Main Topics  . Smoking status: Never Smoker   . Smokeless tobacco: Never Used     Comment: tobacco use - no  . Alcohol Use: Yes  . Drug Use: No  . Sexually Active: Not on file   Other Topics Concern  . Not on file   Social History Narrative   Married, retired. 3 children. Regular exercise -yes; hobbies = swimming.    ROS ALL NEGATIVE EXCEPT THOSE NOTED IN HPI  PE  General Appearance: well developed, well nourished in no acute distress HEENT: symmetrical face, PERRLA, good dentition  Neck: no JVD, thyromegaly, or adenopathy, trachea midline Chest: symmetric without  deformity Cardiac: PMI non-displaced, RRR, normal S1, S2, no gallop or murmur Lung: clear to ausculation and percussion Vascular: all pulses full without bruits  Abdominal: nondistended, nontender, good bowel sounds, no HSM, no bruits Extremities: no cyanosis, clubbing or edema, no sign of DVT, no varicosities  Skin: normal color, no rashes Neuro: alert and oriented x 3, non-focal Pysch: normal affect  EKG Not repeated BMET    Component Value Date/Time   NA 135 07/10/2011 1247   K 4.7 07/10/2011 1247   CL 99 07/10/2011 1247   CO2 27 07/10/2011 1247   GLUCOSE 91 07/10/2011 1247   BUN 22 07/10/2011 1247   CREATININE 1.46* 07/10/2011 1247   CALCIUM 9.3 07/10/2011 1247   GFRNONAA 33* 07/10/2011 1247   GFRAA 39* 07/10/2011 1247    Lipid Panel  No results found for this basename: chol, trig, hdl, cholhdl, vldl, ldlcalc    CBC    Component Value Date/Time   WBC 8.1 07/10/2011 1247   RBC 4.06 07/10/2011 1247   HGB 12.5 07/10/2011 1247   HCT 39.5 07/10/2011 1247   PLT 334 07/10/2011 1247   MCV 97.3 07/10/2011 1247   MCH 30.8 07/10/2011 1247   MCHC 31.6 07/10/2011 1247   RDW 13.1 07/10/2011 1247   LYMPHSABS 1.8 01/30/2009 1138   MONOABS 0.4 01/30/2009 1138   EOSABS 0.2 01/30/2009 1138   BASOSABS 0.0 01/30/2009 1138

## 2012-10-09 NOTE — Assessment & Plan Note (Signed)
Nonobstructive by history with no carotid bruit exam. Continue secondary preventative therapy.

## 2012-10-12 DIAGNOSIS — M545 Low back pain: Secondary | ICD-10-CM | POA: Diagnosis not present

## 2012-10-12 DIAGNOSIS — M999 Biomechanical lesion, unspecified: Secondary | ICD-10-CM | POA: Diagnosis not present

## 2012-10-16 ENCOUNTER — Ambulatory Visit: Payer: Medicare Other | Admitting: Cardiology

## 2012-10-20 DIAGNOSIS — M999 Biomechanical lesion, unspecified: Secondary | ICD-10-CM | POA: Diagnosis not present

## 2012-10-20 DIAGNOSIS — M545 Low back pain: Secondary | ICD-10-CM | POA: Diagnosis not present

## 2012-10-31 ENCOUNTER — Other Ambulatory Visit: Payer: Self-pay | Admitting: Family Medicine

## 2012-11-05 DIAGNOSIS — M353 Polymyalgia rheumatica: Secondary | ICD-10-CM | POA: Diagnosis not present

## 2012-11-05 DIAGNOSIS — M5137 Other intervertebral disc degeneration, lumbosacral region: Secondary | ICD-10-CM | POA: Diagnosis not present

## 2012-11-05 DIAGNOSIS — M19049 Primary osteoarthritis, unspecified hand: Secondary | ICD-10-CM | POA: Diagnosis not present

## 2012-11-05 DIAGNOSIS — M810 Age-related osteoporosis without current pathological fracture: Secondary | ICD-10-CM | POA: Diagnosis not present

## 2012-11-06 ENCOUNTER — Ambulatory Visit (HOSPITAL_COMMUNITY)
Admission: RE | Admit: 2012-11-06 | Discharge: 2012-11-06 | Disposition: A | Discharge status: Home / Self Care | Payer: Medicare Other | Source: Ambulatory Visit | Attending: Rheumatology | Admitting: Rheumatology

## 2012-11-06 DIAGNOSIS — M545 Low back pain, unspecified: Secondary | ICD-10-CM | POA: Insufficient documentation

## 2012-11-06 DIAGNOSIS — IMO0001 Reserved for inherently not codable concepts without codable children: Secondary | ICD-10-CM | POA: Diagnosis not present

## 2012-11-06 DIAGNOSIS — I10 Essential (primary) hypertension: Secondary | ICD-10-CM | POA: Diagnosis not present

## 2012-11-06 DIAGNOSIS — R262 Difficulty in walking, not elsewhere classified: Secondary | ICD-10-CM | POA: Insufficient documentation

## 2012-11-06 NOTE — Evaluation (Addendum)
Physical Therapy Evaluation  Patient Details  Name: Desta H Mooney-Riggs MRN: [id number] Date of Birth: June 16, 1930  Today's Date: 11/06/2012 Time: 1520-1600 PT Time Calculation (min): 40 min Charge Eval             Visit#: 1 of 12  Re-eval: 12/06/12 Assessment Diagnosis: LBP with L radicular pain Surgical Date: 07/10/12 (Kyphoplasty L2) Prior Therapy: not for this  Authorization: medicare    Authorization Time Period:    Authorization Visit#: 1 of 10   Past Medical History:  Past Medical History  Diagnosis Date  . Coronary atherosclerosis of unspecified type of vessel, native or graft   . Other diseases of lung, not elsewhere classified     lung nodule  . DJD (degenerative joint disease) of knee   . IBS (irritable bowel syndrome)   . Polymyalgia rheumatica   . HTN (hypertension)   . Chest pain, unspecified   . History of TIA (transient ischemic attack)   . Myocardial infarction     5 yrs ago     dr wall  cardiac  . Cancer 1988 and  2009    BCC nose and  Melanoma back  . Anemia   . CHF (congestive heart failure)   . Stroke   . Osteoporosis   . Carotid stenosis     mild  . Chronic back pain    Past Surgical History:  Past Surgical History  Procedure Laterality Date  . Lumbar laminectomy    . Removal of melanoma from back  2007  . Knee arthroscopy    . Total knee anthroplasty    . Cardiac catheterization    . Vertebroplasty  07/11/2011    Procedure: VERTEBROPLASTY;  Surgeon: Reinaldo Meeker, MD;  Location: [facility];  Service: Neurosurgery;  Laterality: N/A;  Vertebroplasty of Lumbar Two  . Spine surgery  2013    Broken back: fusion surgery  . Pr vein bypass graft,aorto-fem-pop  2010  . Joint replacement  2001 and  2011    6 knee surgeries   bil replacement  . Abdominal hysterectomy  1977    partial  . Back surgery  1982  . Tonisllectomy  1944    Subjective Symptoms/Limitations Symptoms: Ms. Glynda Jaeger states that the first of May she began having  significant leg pain and has noticed that she is coming more bent forward as the time goes by.  The patient states that at this point there is no way she can walk standing straight up she has to walk bent forward.  She is now walking at a 35 degree forward bend.  She is currently going to aquatic sessions to attempt to get stronger to be able to walk upright as well as to be able to lift her L leg better.  She has not progressed as well as she wants therefore she is being referred to physcial therapy to attempt to improve her strength; posture and functional ability.  Pertinent History: Pt had basal cell carcinoma on her skull, she had skin grafting under her neck, and removal of skin cancer on her nose.   How long can you sit comfortably?: no problem How long can you stand comfortably?: Pt can not stand in an upright position.  Bent forward she can only stand for 10-15 mintues How long can you walk comfortably?: Pt is unable to walk upright.  She walks bent forward for less than five minutes. Special Tests: Pt is unable to do housework without multiple breaks.  She is  unable to mop or vaccum. Pain Assessment Currently in Pain?: Yes Pain Score: 0-No pain (worst is a 9/10 ) Pain Location: Back Pain Orientation: Left Pain Type: Chronic pain Pain Onset: More than a month ago Pain Frequency: Intermittent (no pain with sitting.) Pain Relieving Factors: sitting Effect of Pain on Daily Activities: increasess   Balance Screening Balance Screen Has the patient fallen in the past 6 months: No  Prior Functioning  Prior Function Leisure: Hobbies-yes (Comment) Comments: painting, water aerobics  Cognition/Observation Cognition Overall Cognitive Status: Within Functional Limits for tasks assessed Observation/Other Assessments Observations: Pt stands in 35 degree flexion.  Sensation/Coordination/Flexibility/Functional Tests Functional Tests Functional Tests: oswestry  Assessment RLE  Strength Right Hip Flexion: 3+/5 Right Hip Extension: 2/5 Right Hip ABduction: 3+/5 Right Knee Flexion: 5/5 Right Knee Extension: 5/5 Right Ankle Dorsiflexion: 4/5 LLE Strength Left Hip Flexion: 2/5 Left Hip Extension: 2-/5 Left Hip ABduction: 3/5 Left Knee Flexion: 3+/5 Left Knee Extension: 5/5 Left Ankle Dorsiflexion: 3+/5 Lumbar AROM Lumbar Flexion: flexes at hip Lumbar Extension: decreased 120% Lumbar - Right Side Bend: decreased 20% Lumbar - Left Side Bend: decreased 20% Lumbar - Right Rotation: decreased 80% Lumbar - Left Rotation: decreased 80%  Exercise/Treatments  Stretches Lower Trunk Rotation: 5 reps Standing Extension: 3 reps;30 seconds Supine Ab Set: 10 reps Bent Knee Raise: 10 reps    Physical Therapy Assessment and Plan PT Assessment and Plan Clinical Impression Statement: Pt referred to physical therapy for leg weakness and postural changes;  Exam demonstrates incresaed pain, decreased strength and postural changes.  PT will benefit from skilled therapy to return to prior functional level to improve her qualithy of life. Pt will benefit from skilled therapeutic intervention in order to improve on the following deficits: Abnormal gait;Decreased activity tolerance;Decreased range of motion;Decreased strength;Difficulty walking;Pain Rehab Potential: Good PT Frequency: Min 3X/week PT Duration: 4 weeks PT Treatment/Interventions: Therapeutic activities;Therapeutic exercise;Patient/family education PT Plan: begin decompressive there ex 1-5; stab bridging , clam and isometric hip flexas well as tall sitting with scapular retraction next treatment then continue with stab and postural exercises    Goals Home Exercise Program Pt/caregiver will Perform Home Exercise Program: For increased ROM;For increased strengthening PT Goal: Perform Home Exercise Program - Progress: Goal set today PT Short Term Goals Time to Complete Short Term Goals: 2 weeks PT Short Term  Goal 1: Pt to be able to stand with forward bend of only 15 degrees PT Short Term Goal 2: Pt to be able to walk for 15 minutes for a healthy lifestyle PT Short Term Goal 3: Pt strength to be imprvoed 1/2 grade to allow the above to occur PT Long Term Goals Time to Complete Long Term Goals: 4 weeks PT Long Term Goal 1: Pt to be I in advance HEP PT Long Term Goal 2: Pt LE strength increased one grade to allow pt to walk for 30 minutes for healthier life style Long Term Goal 3: Pt Oswestry to be improved 10 points  Long Term Goal 4: Pt to be able to stand erect when walking  Problem List Patient Active Problem List   Diagnosis Date Noted  . Difficulty in walking(719.7) 11/06/2012  . Occlusion and stenosis of carotid artery without mention of cerebral infarction 02/07/2012  . OLD MYOCARDIAL INFARCTION 11/07/2009  . CAD, NATIVE VESSEL 11/07/2009  . HYPERTENSION 09/28/2008  . IRRITABLE BOWEL SYNDROME 09/28/2008  . DEGENERATIVE JOINT DISEASE, KNEE 09/28/2008  . POLYMYALGIA RHEUMATICA 09/28/2008  . CHEST PAIN UNSPECIFIED 09/28/2008    PT -  End of Session Activity Tolerance: Patient tolerated treatment well PT Plan of Care PT Home Exercise Plan: given  GP Functional Assessment Tool Used: Oswestry Functional Limitation: Self care Self Care Goal Current  ([id number]): At least 40 percent but less than 60 percent impaired, limited or restricted Self Care Goal  [phone]): At least 1 percent but less than 20 percent impaired, limited or restricted  Kolbey Teichert,CINDY 11/06/2012, 5:28 PM  Physician Documentation Your signature is required to indicate approval of the treatment plan as stated above.  Please sign and either send electronically or make a copy of this report for your files and return this physician signed original.   Please mark one 1.__approve of plan  2. ___approve of plan with the following conditions.   ______________________________                                                           _____________________ Physician Signature                                                                                                             Date

## 2012-11-09 ENCOUNTER — Ambulatory Visit (HOSPITAL_COMMUNITY)
Admission: RE | Admit: 2012-11-09 | Discharge: 2012-11-09 | Disposition: A | Discharge status: Home / Self Care | Payer: Medicare Other | Source: Ambulatory Visit | Attending: Family Medicine | Admitting: Family Medicine

## 2012-11-09 DIAGNOSIS — IMO0001 Reserved for inherently not codable concepts without codable children: Secondary | ICD-10-CM | POA: Diagnosis not present

## 2012-11-09 DIAGNOSIS — I10 Essential (primary) hypertension: Secondary | ICD-10-CM | POA: Diagnosis not present

## 2012-11-09 DIAGNOSIS — R262 Difficulty in walking, not elsewhere classified: Secondary | ICD-10-CM

## 2012-11-09 DIAGNOSIS — M545 Low back pain: Secondary | ICD-10-CM | POA: Diagnosis not present

## 2012-11-09 NOTE — Progress Notes (Signed)
Physical Therapy Treatment Patient Details  Name: Brenda Hall MRN: [id number] Date of Birth: 04/24/1930  Today's Date: 11/09/2012 Time: 1308-6578 PT Time Calculation (min): 41 min Charge: TE 41' 1305-1346  Visit#: 2 of 12  Re-eval: 12/06/12 Assessment Diagnosis: LBP with L radicular pain Surgical Date: 07/10/12  Authorization: medicare  Authorization Time Period:    Authorization Visit#: 2 of 10   Subjective: Symptoms/Limitations Symptoms: Pt stated increased pain following last session, has been focusing on the breathing but has not completed other HEP exercises.  Has returned to water and finding great relief with return.  Currently pain scale 3/10 Lt hip and knee.  No back pain. Pain Assessment Currently in Pain?: Yes Pain Score: 3  Pain Location: Hip Pain Orientation: Left  Precautions/Restrictions  Precautions Precautions: Other (comment) Precaution Comments: hx of cancer  Exercise/Treatments Supine Ab Set: 10 reps Glut Set: 10 reps;5 seconds Clam: 5 reps;Limitations Clam Limitations: 5x 10" Bent Knee Raise: 10 reps Isometric Hip Flexion: 5 reps;5 seconds;Limitations Isometric Hip Flexion Limitations: Bil LE Other Supine Lumbar Exercises: Decompression exercises [id number]     Physical Therapy Assessment and Plan PT Assessment and Plan Clinical Impression Statement: Began treatment with decompression exercises to improve tolerance with supine position.  Pt educated importance of posture with sitting, standing and gait.  Began exercises for core and LE strengthening with cueing for stability and technique.  Pt c/o cramps Bil LE through out session.   PT Plan: Continue with current POC for core stability and postural exercises.    Goals    Problem List Patient Active Problem List   Diagnosis Date Noted  . Difficulty in walking(719.7) 11/06/2012  . Occlusion and stenosis of carotid artery without mention of cerebral infarction 02/07/2012  . OLD  MYOCARDIAL INFARCTION 11/07/2009  . CAD, NATIVE VESSEL 11/07/2009  . HYPERTENSION 09/28/2008  . IRRITABLE BOWEL SYNDROME 09/28/2008  . DEGENERATIVE JOINT DISEASE, KNEE 09/28/2008  . POLYMYALGIA RHEUMATICA 09/28/2008  . CHEST PAIN UNSPECIFIED 09/28/2008    PT - End of Session Activity Tolerance: Patient tolerated treatment well General Behavior During Therapy: [facility] for tasks assessed/performed  GP    Juel Burrow 11/09/2012, 5:39 PM

## 2012-11-11 ENCOUNTER — Telehealth: Payer: Self-pay | Admitting: Family Medicine

## 2012-11-11 ENCOUNTER — Ambulatory Visit (HOSPITAL_COMMUNITY)
Admission: RE | Admit: 2012-11-11 | Discharge: 2012-11-11 | Disposition: A | Discharge status: Home / Self Care | Payer: Medicare Other | Source: Ambulatory Visit | Attending: Rheumatology | Admitting: Rheumatology

## 2012-11-11 ENCOUNTER — Other Ambulatory Visit: Payer: Self-pay | Admitting: *Deleted

## 2012-11-11 DIAGNOSIS — M545 Low back pain: Secondary | ICD-10-CM | POA: Diagnosis not present

## 2012-11-11 DIAGNOSIS — IMO0001 Reserved for inherently not codable concepts without codable children: Secondary | ICD-10-CM | POA: Diagnosis not present

## 2012-11-11 DIAGNOSIS — R195 Other fecal abnormalities: Secondary | ICD-10-CM

## 2012-11-11 DIAGNOSIS — I10 Essential (primary) hypertension: Secondary | ICD-10-CM | POA: Diagnosis not present

## 2012-11-11 DIAGNOSIS — R262 Difficulty in walking, not elsewhere classified: Secondary | ICD-10-CM | POA: Diagnosis not present

## 2012-11-11 NOTE — Telephone Encounter (Signed)
Pt notified on her voicemail we will set up referral. Order sent to [facility]

## 2012-11-11 NOTE — Telephone Encounter (Signed)
Pt wants referral to Dr. Karilyn Cota. She states she has been seeing you for loose BM and taking lomital. She is ready to see Dr. Karilyn Cota and  Called his office but they told her she needed a referral. Can you refer her or does she need to be seen first.

## 2012-11-11 NOTE — Telephone Encounter (Signed)
Lets do ref 

## 2012-11-11 NOTE — Progress Notes (Signed)
Physical Therapy Treatment Patient Details  Name: Winona H Mooney-Riggs MRN: [id number] Date of Birth: 1930/11/02  Today's Date: 11/11/2012 Time: 1610-1650 PT Time Calculation (min): 40 min  Visit#: 3 of 12  Re-eval: 12/06/12 Charges: Therex x 38' [phone])  Authorization: medicare Authorization Visit#: 3 of 10  Subjective: Symptoms/Limitations Symptoms: Pt states that she feels doing water aerobics has really helped her. Pain Assessment Currently in Pain?: No/denies   Exercise/Treatments Supine Ab Set: 10 reps Bent Knee Raise: 10 reps Sidelying Clam: 5 reps;5 seconds Hip Abduction: 10 reps Prone  Other Prone Lumbar Exercises: Glute set x 10; heel squeeze x 10  Physical Therapy Assessment and Plan PT Assessment and Plan Clinical Impression Statement: Pt displays significant weakness throughout bilateral hips, left greater than right. Pt has increased pain with active left hip flexion. No pain with passive hip flexion. Attempted prone hip extension but pt is unable to complete secondary to weakness. Encouraged pt to lay on stomach more often and home to improve lumbar mobility and posture. PT Plan: Continue with current POC for core stability and postural exercises. Recommend ulatrasoun and manual techniques to decrease pain and adhesions.     Problem List Patient Active Problem List   Diagnosis Date Noted  . Difficulty in walking(719.7) 11/06/2012  . Occlusion and stenosis of carotid artery without mention of cerebral infarction 02/07/2012  . OLD MYOCARDIAL INFARCTION 11/07/2009  . CAD, NATIVE VESSEL 11/07/2009  . HYPERTENSION 09/28/2008  . IRRITABLE BOWEL SYNDROME 09/28/2008  . DEGENERATIVE JOINT DISEASE, KNEE 09/28/2008  . POLYMYALGIA RHEUMATICA 09/28/2008  . CHEST PAIN UNSPECIFIED 09/28/2008    PT - End of Session Activity Tolerance: Patient tolerated treatment well General Behavior During Therapy: [facility] for tasks assessed/performed  Seth Bake, PTA  11/11/2012, 5:01 PM

## 2012-11-11 NOTE — Telephone Encounter (Signed)
Patient needs referral to Dr. Karilyn Cota

## 2012-11-13 ENCOUNTER — Ambulatory Visit (HOSPITAL_COMMUNITY)
Admission: RE | Admit: 2012-11-13 | Discharge: 2012-11-13 | Disposition: A | Discharge status: Home / Self Care | Payer: Medicare Other | Source: Ambulatory Visit | Attending: Family Medicine | Admitting: Family Medicine

## 2012-11-13 DIAGNOSIS — I10 Essential (primary) hypertension: Secondary | ICD-10-CM | POA: Diagnosis not present

## 2012-11-13 DIAGNOSIS — R262 Difficulty in walking, not elsewhere classified: Secondary | ICD-10-CM | POA: Diagnosis not present

## 2012-11-13 DIAGNOSIS — IMO0001 Reserved for inherently not codable concepts without codable children: Secondary | ICD-10-CM | POA: Diagnosis not present

## 2012-11-13 DIAGNOSIS — M545 Low back pain: Secondary | ICD-10-CM | POA: Diagnosis not present

## 2012-11-13 NOTE — Progress Notes (Signed)
Physical Therapy Treatment Patient Details  Name: Brenda Hall MRN: [id number] Date of Birth: Sep 30, 1930  Today's Date: 11/13/2012 Time: 9147-8295 PT Time Calculation (min): 43 min Charge there ex 2 1435-1502; manual 1 6213-0865 Visit#: 4 of 12  Re-eval: 12/06/12    Authorization: medicare  Authorization Time Period:    Authorization Visit#: 4 of 10   Subjective: Symptoms/Limitations Symptoms: Pt continues to do her water aerobics Pain Assessment Pain Score: 3    Exercise/Treatments  Standing Scapular Retraction: Strengthening;10 reps;Theraband Theraband Level (Scapular Retraction): Level 2 (Red) Row: Strengthening;10 reps;Theraband Theraband Level (Row): Level 2 (Red) Shoulder Extension: Theraband Theraband Level (Shoulder Extension): Level 2 (Red) Shoulder ADduction: Both;10 reps;Theraband Other Standing Lumbar Exercises: stand against wall B UE flexionx 10 Seated Other Seated Lumbar Exercises: scapular retraction x 10 Supine Bridge: 10 reps Sidelying Hip Abduction: 10 reps Prone  Straight Leg Raise: 10 reps Other Prone Lumbar Exercises: heel squeeze; hip IR/ER, knee flextion all x 10  Manual Therapy Manual Therapy: Myofascial release Myofascial Release: to reduce fascial restriction.  Physical Therapy Assessment and Plan PT Assessment and Plan Clinical Impression Statement: Pt hips shift to Rt.  Worked on pt shifting hips Lt for better alignment.  Pt tolerated prone much better today with improved abilitiy to extemd hip.   PT Plan: Continue to work on postural and core strengthening     Problem List Patient Active Problem List   Diagnosis Date Noted  . Difficulty in walking(719.7) 11/06/2012  . Occlusion and stenosis of carotid artery without mention of cerebral infarction 02/07/2012  . OLD MYOCARDIAL INFARCTION 11/07/2009  . CAD, NATIVE VESSEL 11/07/2009  . HYPERTENSION 09/28/2008  . IRRITABLE BOWEL SYNDROME 09/28/2008  . DEGENERATIVE JOINT  DISEASE, KNEE 09/28/2008  . POLYMYALGIA RHEUMATICA 09/28/2008  . CHEST PAIN UNSPECIFIED 09/28/2008    PT - End of Session Activity Tolerance: Patient tolerated treatment well  GP    Ceazia Harb,CINDY 11/13/2012, 3:33 PM

## 2012-11-16 ENCOUNTER — Other Ambulatory Visit: Payer: Self-pay | Admitting: *Deleted

## 2012-11-16 ENCOUNTER — Ambulatory Visit (HOSPITAL_COMMUNITY)
Admission: RE | Admit: 2012-11-16 | Discharge: 2012-11-16 | Disposition: A | Discharge status: Home / Self Care | Payer: Medicare Other | Source: Ambulatory Visit | Attending: Family Medicine | Admitting: Family Medicine

## 2012-11-16 DIAGNOSIS — M545 Low back pain: Secondary | ICD-10-CM | POA: Diagnosis not present

## 2012-11-16 DIAGNOSIS — I10 Essential (primary) hypertension: Secondary | ICD-10-CM | POA: Diagnosis not present

## 2012-11-16 DIAGNOSIS — IMO0001 Reserved for inherently not codable concepts without codable children: Secondary | ICD-10-CM | POA: Diagnosis not present

## 2012-11-16 DIAGNOSIS — R262 Difficulty in walking, not elsewhere classified: Secondary | ICD-10-CM | POA: Diagnosis not present

## 2012-11-16 MED ORDER — METOPROLOL TARTRATE 25 MG PO TABS: ORAL_TABLET | ORAL | Status: DC

## 2012-11-16 MED ORDER — LISINOPRIL 10 MG PO TABS: ORAL_TABLET | ORAL | Status: DC

## 2012-11-16 NOTE — Progress Notes (Signed)
Physical Therapy Treatment Patient Details  Name: Brenda Hall MRN: [id number] Date of Birth: 1931/01/24  Today's Date: 11/16/2012 Time: 4098-1191 PT Time Calculation (min): 41 min  Visit#: 5 of 12  Re-eval: 12/06/12   Charge there ex 3 1311-1350 Authorization: medicare   Authorization Visit#: 5 of 10   Subjective: Symptoms/Limitations Symptoms: Pt states she can tell that she is standing taller  Exercise/Treatments   Standing Wall Slides: 10 reps Scapular Retraction: Strengthening;10 reps;Theraband Theraband Level (Scapular Retraction): Level 2 (Red) Row: Strengthening;10 reps;Theraband Theraband Level (Row): Level 2 (Red) Shoulder Extension: Theraband Theraband Level (Shoulder Extension): Level 2 (Red) Shoulder ADduction: Both;10 reps;Theraband Other Standing Lumbar Exercises: stand against wall B UE flexionx 10 Other Standing Lumbar Exercises: chest stretch x 10 Seated Other Seated Lumbar Exercises: scapular retraction x 10 Supine Bridge: 10 reps Straight Leg Raise: 10 reps;Limitations Straight Leg Raises Limitations: floating SLR Sidelying Clam: 10 reps Hip Abduction: 10 reps Prone  Single Arm Raise: 10 reps Straight Leg Raise: 10 reps Other Prone Lumbar Exercises: heel squeeze; hip IR/ER, knee flextion all x 10  Physical Therapy Assessment and Plan PT Assessment and Plan Clinical Impression Statement: Pt continues to improve in strength in glut mm.  Added new ex with verbal cuing for proper technique.  Pt educated on the proper was to roll to her side as she stated that she was having a difficulty time with rolling Pt will benefit from skilled therapeutic intervention in order to improve on the following deficits: Abnormal gait;Decreased activity tolerance;Decreased range of motion;Decreased strength;Difficulty walking;Pain PT Treatment/Interventions: Therapeutic activities;Therapeutic exercise;Patient/family education PT Plan: Continue to work on  postural and core strengthening    Goals  progressing  Problem List Patient Active Problem List   Diagnosis Date Noted  . Difficulty in walking(719.7) 11/06/2012  . Occlusion and stenosis of carotid artery without mention of cerebral infarction 02/07/2012  . OLD MYOCARDIAL INFARCTION 11/07/2009  . CAD, NATIVE VESSEL 11/07/2009  . HYPERTENSION 09/28/2008  . IRRITABLE BOWEL SYNDROME 09/28/2008  . DEGENERATIVE JOINT DISEASE, KNEE 09/28/2008  . POLYMYALGIA RHEUMATICA 09/28/2008  . CHEST PAIN UNSPECIFIED 09/28/2008       GP    RUSSELL,CINDY 11/16/2012, 1:59 PM

## 2012-11-17 DIAGNOSIS — N3941 Urge incontinence: Secondary | ICD-10-CM | POA: Diagnosis not present

## 2012-11-17 DIAGNOSIS — M899 Disorder of bone, unspecified: Secondary | ICD-10-CM | POA: Diagnosis not present

## 2012-11-17 DIAGNOSIS — M949 Disorder of cartilage, unspecified: Secondary | ICD-10-CM | POA: Diagnosis not present

## 2012-11-17 DIAGNOSIS — R350 Frequency of micturition: Secondary | ICD-10-CM | POA: Diagnosis not present

## 2012-11-18 ENCOUNTER — Ambulatory Visit (HOSPITAL_COMMUNITY): Payer: Medicare Other | Admitting: *Deleted

## 2012-11-19 ENCOUNTER — Encounter: Payer: Self-pay | Admitting: Family Medicine

## 2012-11-19 ENCOUNTER — Ambulatory Visit (INDEPENDENT_AMBULATORY_CARE_PROVIDER_SITE_OTHER): Payer: Medicare Other | Admitting: Family Medicine

## 2012-11-19 VITALS — BP 132/70 | HR 80 | Wt 158.0 lb

## 2012-11-19 DIAGNOSIS — Z79899 Other long term (current) drug therapy: Secondary | ICD-10-CM

## 2012-11-19 DIAGNOSIS — K589 Irritable bowel syndrome without diarrhea: Secondary | ICD-10-CM | POA: Diagnosis not present

## 2012-11-19 DIAGNOSIS — E785 Hyperlipidemia, unspecified: Secondary | ICD-10-CM

## 2012-11-19 DIAGNOSIS — I6350 Cerebral infarction due to unspecified occlusion or stenosis of unspecified cerebral artery: Secondary | ICD-10-CM

## 2012-11-19 DIAGNOSIS — I639 Cerebral infarction, unspecified: Secondary | ICD-10-CM

## 2012-11-19 DIAGNOSIS — I2510 Atherosclerotic heart disease of native coronary artery without angina pectoris: Secondary | ICD-10-CM

## 2012-11-19 DIAGNOSIS — I10 Essential (primary) hypertension: Secondary | ICD-10-CM

## 2012-11-19 NOTE — Progress Notes (Signed)
  Subjective:    Patient ID: Brenda Hall, female    DOB: October 25, 1930, 79 y.o.   MRN: [id number]  HPI Watching for regular exercise.  IBS still giving pt fits. Due to see dr Karilyn Cota soon for this. Chronic diarrhea  Diet improved,  Reflux good control. No problems  Compliant with all bp meds   Review of Systems No chest pain some back pain diminished energy though improving no abdominal pain. Some frequent loose stools ROS otherwise negative    Objective:   Physical Exam  Alert no acute distress very talkative lungs clear. Heart regular rate and rhythm. Ankles without edema.      Assessment & Plan:  Impression osteoporosis bone density shows definite improvement. #2 hypertension good control. #3 hyperlipidemia status uncertain. #4 coronary artery disease clinically silent. #5 stroke no recurrence of symptoms. Plan appropriate blood work. Maintain same meds. Diet exercise discussed. Check every 6 months as far as chronic disease management. WSL

## 2012-11-19 NOTE — Patient Instructions (Signed)
Keep working on diet and exercise

## 2012-11-20 ENCOUNTER — Ambulatory Visit (HOSPITAL_COMMUNITY)
Admission: RE | Admit: 2012-11-20 | Discharge: 2012-11-20 | Disposition: A | Discharge status: Home / Self Care | Payer: Medicare Other | Source: Ambulatory Visit | Attending: Family Medicine | Admitting: Family Medicine

## 2012-11-20 DIAGNOSIS — IMO0001 Reserved for inherently not codable concepts without codable children: Secondary | ICD-10-CM | POA: Insufficient documentation

## 2012-11-20 DIAGNOSIS — M545 Low back pain, unspecified: Secondary | ICD-10-CM | POA: Insufficient documentation

## 2012-11-20 DIAGNOSIS — R262 Difficulty in walking, not elsewhere classified: Secondary | ICD-10-CM | POA: Insufficient documentation

## 2012-11-20 DIAGNOSIS — I10 Essential (primary) hypertension: Secondary | ICD-10-CM | POA: Diagnosis not present

## 2012-11-20 NOTE — Progress Notes (Addendum)
Physical Therapy Treatment Patient Details  Name: Brenda Hall MRN: [id number] Date of Birth: 1930/11/30  Today's Date: 11/20/2012 Time: 4098-1191 PT Time Calculation (min): 45 min Charge:  There ex (3)( 1430-1504)  [place] 4782-9562 Visit#: 6 of 12  Re-eval: 12/06/12   Authorization: medicare    Subjective: Symptoms/Limitations Symptoms: Pt states that she had increased pain after she got home after last session.  Pt leg length was checked last visit with a one inch difference noted.  Pt states she checked [facility] who only have a 1/2 inch.  Therapist advised pt to try this so her mm can get use to this height then if this improves her pain she may want to go to the full inch. Pain Assessment Currently in Pain?: Yes (pain down to a 0 after treatment) Pain Score: 4  Pain Location: Hip  Exercise/Treatments      Standing Wall Slides: 10 reps Scapular Retraction: Strengthening;10 reps;Theraband Theraband Level (Scapular Retraction): Level 2 (Red) Row: Strengthening;10 reps;Theraband Theraband Level (Row): Level 2 (Red) Shoulder Extension: Theraband Theraband Level (Shoulder Extension): Level 2 (Red) Shoulder ADduction: Both;10 reps;Theraband Other Standing Lumbar Exercises: stand against wall B UE flexionx 10 Other Standing Lumbar Exercises: chest stretch x 10, shift hips to left to improve posture.   Supine Ab Set: 10 reps Other Supine Lumbar Exercises: decompression ex 1-5; t-band decompression exercises     Modalities Modalities: Ultrasound Ultrasound Ultrasound Location: L SI  Ultrasound Parameters: 1.5 w/cm2 x 8' with 1 MgHz Ultrasound Goals: Pain  Physical Therapy Assessment and Plan PT Assessment and Plan Clinical Impression Statement: Pt continues to have significant mm weakness.  Pt instructed in proper way to go from sitting to supine for proper back care.  Pt given T-band for both standing postural and supine decompressive exercises.   PT  Frequency: Min 3X/week PT Plan: Continue to work on postural and core strengthening.  May discontinue t-band exercises as pt is able to do this at home now.     Goals    Problem List Patient Active Problem List   Diagnosis Date Noted  . Other and unspecified hyperlipidemia 11/19/2012  . Cerebrovascular accident (stroke) 11/19/2012  . Difficulty in walking(719.7) 11/06/2012  . Occlusion and stenosis of carotid artery without mention of cerebral infarction 02/07/2012  . OLD MYOCARDIAL INFARCTION 11/07/2009  . CAD, NATIVE VESSEL 11/07/2009  . HYPERTENSION 09/28/2008  . IRRITABLE BOWEL SYNDROME 09/28/2008  . DEGENERATIVE JOINT DISEASE, KNEE 09/28/2008  . POLYMYALGIA RHEUMATICA 09/28/2008  . CHEST PAIN UNSPECIFIED 09/28/2008    PT - End of Session Activity Tolerance: Patient tolerated treatment well General Behavior During Therapy: [facility] for tasks assessed/performed PT Plan of Care PT Home Exercise Plan: new HEP given  GP Functional Assessment Tool Used: Oswestry  RUSSELL,CINDY 11/20/2012, 3:42 PM

## 2012-11-25 ENCOUNTER — Ambulatory Visit (HOSPITAL_COMMUNITY)
Admission: RE | Admit: 2012-11-25 | Discharge: 2012-11-25 | Disposition: A | Discharge status: Home / Self Care | Payer: Medicare Other | Source: Ambulatory Visit | Attending: Rheumatology | Admitting: Rheumatology

## 2012-11-25 DIAGNOSIS — R262 Difficulty in walking, not elsewhere classified: Secondary | ICD-10-CM

## 2012-11-25 DIAGNOSIS — I10 Essential (primary) hypertension: Secondary | ICD-10-CM | POA: Diagnosis not present

## 2012-11-25 DIAGNOSIS — IMO0001 Reserved for inherently not codable concepts without codable children: Secondary | ICD-10-CM | POA: Diagnosis not present

## 2012-11-25 DIAGNOSIS — M545 Low back pain: Secondary | ICD-10-CM | POA: Diagnosis not present

## 2012-11-25 NOTE — Progress Notes (Signed)
Physical Therapy Treatment Patient Details  Name: Brenda Hall MRN: [id number] Date of Birth: 1930/06/14  Today's Date: 11/25/2012 Time: 0865-7846 PT Time Calculation (min): 46 min Charge: TE 9629-5284, [place] 1324-4010  Visit#: 7 of 12  Re-eval: 12/06/12 Assessment Diagnosis: LBP with L radicular pain Surgical Date: 07/10/12  Authorization: medicare  Authorization Time Period:    Authorization Visit#: 7 of 10   Subjective: Symptoms/Limitations Symptoms: Pt stated she bought a 1/2in support for her heel and reported walking with increased pain following.  Pt reported going on trip, able to tolerate riding in vehicle for 3 hours with ice applied to SI. Pain Assessment Currently in Pain?: No/denies  Precautions/Restrictions  Precautions Precautions: Other (comment) Precaution Comments: hx of cancer  Exercise/Treatments Stretches Standing Extension: 3 reps Prone on Elbows Stretch: 2 reps;60 seconds Press Ups: 5 reps;10 seconds Standing Functional Squats: 10 reps;Limitations Functional Squats Limitations: proper lifting weight ball Wall Slides: 15 reps Other Standing Lumbar Exercises: stand against wall B UE flexionx 10 Other Standing Lumbar Exercises: chest stretch x 10, shift hips to left to improve posture. Seated Other Seated Lumbar Exercises: cervical retraction 10x5", cervical retraction x10 Other Seated Lumbar Exercises: posture awareness Prone  Single Arm Raise: 10 reps Straight Leg Raise: 10 reps  Modalities Modalities: Cryotherapy Ultrasound Ultrasound Location: L SI  Ultrasound Parameters: 1.5 w/cm2 x 8' 1 MHz continuous Ultrasound Goals: Pain  Physical Therapy Assessment and Plan PT Assessment and Plan Clinical Impression Statement: Treatment focus on posture awareness.  Pt educated on importance of extension to beneift posture with use of spinal core figure.  Added POE to improve extensin and seated posture strengthening exercises with mirror as  visual feed back.  [place] used at end of session to assist with LBP acquired  with prone exercises.   PT Plan: Continue to work on postural and core strengthening.  May discontinue t-band exercises as pt is able to do this at home now.     Goals    Problem List Patient Active Problem List   Diagnosis Date Noted  . Other and unspecified hyperlipidemia 11/19/2012  . Cerebrovascular accident (stroke) 11/19/2012  . Difficulty in walking(719.7) 11/06/2012  . Occlusion and stenosis of carotid artery without mention of cerebral infarction 02/07/2012  . OLD MYOCARDIAL INFARCTION 11/07/2009  . CAD, NATIVE VESSEL 11/07/2009  . HYPERTENSION 09/28/2008  . IRRITABLE BOWEL SYNDROME 09/28/2008  . DEGENERATIVE JOINT DISEASE, KNEE 09/28/2008  . POLYMYALGIA RHEUMATICA 09/28/2008  . CHEST PAIN UNSPECIFIED 09/28/2008    PT - End of Session Activity Tolerance: Patient tolerated treatment well General Behavior During Therapy: [facility] for tasks assessed/performed  GP    Juel Burrow 11/25/2012, 5:28 PM

## 2012-11-26 DIAGNOSIS — Z79899 Other long term (current) drug therapy: Secondary | ICD-10-CM | POA: Diagnosis not present

## 2012-11-26 DIAGNOSIS — I10 Essential (primary) hypertension: Secondary | ICD-10-CM | POA: Diagnosis not present

## 2012-11-26 LAB — BASIC METABOLIC PANEL
BUN: 21 mg/dL (ref 6–23)
Chloride: 108 mEq/L (ref 96–112)
Glucose, Bld: 96 mg/dL (ref 70–99)
Potassium: 4.7 mEq/L (ref 3.5–5.3)

## 2012-11-26 LAB — HEPATIC FUNCTION PANEL
ALT: 11 U/L (ref 0–35)
Alkaline Phosphatase: 47 U/L (ref 39–117)
Bilirubin, Direct: 0.1 mg/dL (ref 0.0–0.3)
Indirect Bilirubin: 0.5 mg/dL (ref 0.0–0.9)

## 2012-11-26 LAB — LIPID PANEL
Cholesterol: 145 mg/dL (ref 0–200)
VLDL: 21 mg/dL (ref 0–40)

## 2012-11-28 ENCOUNTER — Encounter: Payer: Self-pay | Admitting: Family Medicine

## 2012-11-28 ENCOUNTER — Other Ambulatory Visit: Payer: Self-pay | Admitting: Family Medicine

## 2012-11-30 ENCOUNTER — Ambulatory Visit (HOSPITAL_COMMUNITY)
Admission: RE | Admit: 2012-11-30 | Discharge: 2012-11-30 | Disposition: A | Discharge status: Home / Self Care | Payer: Medicare Other | Source: Ambulatory Visit | Attending: Family Medicine | Admitting: Family Medicine

## 2012-11-30 NOTE — Progress Notes (Signed)
Physical Therapy Treatment Patient Details  Name: Brenda Hall MRN: [id number] Date of Birth: [birth year]  Today's Date: 11/30/2012 Time: 1191-4782 PT Time Calculation (min): 50 min Charges: Therex x 35'(1435-1510) [place] x 8'(1512-1520)  Visit#: 8 of 12  Re-eval: 12/06/12  Authorization: medicare  Authorization Visit#: 8 of 10   Subjective: Symptoms/Limitations Symptoms: Pt states that she feels like therapy is helping. Pain Assessment Currently in Pain?: No/denies   Exercise/Treatments Standing Wall Slides: 15 reps Other Standing Lumbar Exercises: stand against wall B UE flexionx 10 Other Standing Lumbar Exercises: chest stretch x 10 Supine Bridge: 10 reps Straight Leg Raise: 10 reps Prone  Single Arm Raise: 10 reps Straight Leg Raise: 10 reps Other Prone Lumbar Exercises: heel squeeze 10x5" hamstring curl x 10 bilateral  Modalities Modalities: Ultrasound (Completed by Annett Fabian, PT) Ultrasound Ultrasound Location: L SI Ultrasound Parameters: 1.5 w/cm2 x 8' 1 MHz continuous Ultrasound Goals: Pain  Physical Therapy Assessment and Plan PT Assessment and Plan Clinical Impression Statement: Pt has most difficulty with hip flexion and hip extension. Gluteal region displays significant weakness. Pt continues to ambulate with forward flexed posture. Pt reports slight increase in pain after TE today. Pt reports decreased pain after ultrasound today. PT Plan: Continue to work on postural and core strengthening.     Problem List Patient Active Problem List   Diagnosis Date Noted  . Other and unspecified hyperlipidemia 11/19/2012  . Cerebrovascular accident (stroke) 11/19/2012  . Difficulty in walking(719.7) 11/06/2012  . Occlusion and stenosis of carotid artery without mention of cerebral infarction 02/07/2012  . OLD MYOCARDIAL INFARCTION 11/07/2009  . CAD, NATIVE VESSEL 11/07/2009  . HYPERTENSION 09/28/2008  . IRRITABLE BOWEL SYNDROME 09/28/2008  . DEGENERATIVE  JOINT DISEASE, KNEE 09/28/2008  . POLYMYALGIA RHEUMATICA 09/28/2008  . CHEST PAIN UNSPECIFIED 09/28/2008    PT - End of Session Activity Tolerance: Patient tolerated treatment well General Behavior During Therapy: [facility] for tasks assessed/performed  Seth Bake, PTA  11/30/2012, 3:51 PM

## 2012-12-02 ENCOUNTER — Ambulatory Visit (HOSPITAL_COMMUNITY)
Admission: RE | Admit: 2012-12-02 | Discharge: 2012-12-02 | Disposition: A | Discharge status: Home / Self Care | Payer: Medicare Other | Source: Ambulatory Visit | Attending: Family Medicine | Admitting: Family Medicine

## 2012-12-02 DIAGNOSIS — M545 Low back pain: Secondary | ICD-10-CM | POA: Diagnosis not present

## 2012-12-02 DIAGNOSIS — I10 Essential (primary) hypertension: Secondary | ICD-10-CM | POA: Diagnosis not present

## 2012-12-02 DIAGNOSIS — IMO0001 Reserved for inherently not codable concepts without codable children: Secondary | ICD-10-CM | POA: Diagnosis not present

## 2012-12-02 DIAGNOSIS — R262 Difficulty in walking, not elsewhere classified: Secondary | ICD-10-CM | POA: Diagnosis not present

## 2012-12-02 NOTE — Progress Notes (Signed)
Physical Therapy Treatment Patient Details  Name: Shamiracle H Mooney-Riggs MRN: [id number] Date of Birth: 10-Nov-1930  Today's Date: 12/02/2012 Time: 2130-8657 PT Time Calculation (min): 43 min Charges: Therex x 84'(6962-9528) Self care x [phone]) Ultrasound x [phone])  Visit#: 9 of 12  Re-eval: 12/06/12  Authorization: medicare  Authorization Visit#: 9 of 10   Subjective: Symptoms/Limitations Symptoms: Pt states that her pain the past week or two has been lower. Pain Assessment Currently in Pain?: No/denies   Exercise/Treatments Standing Heel Raises: 10 reps;Limitations Heel Raises Limitations: toe raises x 10 Wall Slides: 15 reps Supine Bridge: 15 reps Straight Leg Raise: 15 reps Prone  Straight Leg Raise: 10 reps Other Prone Lumbar Exercises: hamstring curl x 10 bilateral  Modalities Modalities: Ultrasound Ultrasound Ultrasound Location: L sacroiliac joint  Ultrasound Parameters: 1.2 w/cm2 x 8' 1 MHz continuous Ultrasound Goals: Pain  Physical Therapy Assessment and Plan PT Assessment and Plan Clinical Impression Statement: Spoke to patient about leg length discrepancy. She states that she purchased 1/3" heel lift for right shoe but this increased her back pain. Advised pt to wear heel lift on hour at a time and gradually increase wear time as tolerated. Ultrasound completed at end of session secondary to increase lumbar discomfort after exercises. Pain decreased after ultrasound. PT Plan: Reassess next session.    Goals Home Exercise Program Pt/caregiver will Perform Home Exercise Program: For increased ROM;For increased strengthening PT Short Term Goals Time to Complete Short Term Goals: 2 weeks PT Short Term Goal 1: Pt to be able to stand with forward bend of only 15 degrees PT Short Term Goal 2: Pt to be able to walk for 15 minutes for a healthy lifestyle PT Short Term Goal 3: Pt strength to be imprvoed 1/2 grade to allow the above to occur PT Long Term  Goals Time to Complete Long Term Goals: 4 weeks PT Long Term Goal 1: Pt to be I in advance HEP PT Long Term Goal 2: Pt LE strength increased one grade to allow pt to walk for 30 minutes for healthier life style Long Term Goal 3: Pt Oswestry to be improved 10 points  Long Term Goal 4: Pt to be able to stand erect when walking  Problem List Patient Active Problem List   Diagnosis Date Noted  . Other and unspecified hyperlipidemia 11/19/2012  . Cerebrovascular accident (stroke) 11/19/2012  . Difficulty in walking(719.7) 11/06/2012  . Occlusion and stenosis of carotid artery without mention of cerebral infarction 02/07/2012  . OLD MYOCARDIAL INFARCTION 11/07/2009  . CAD, NATIVE VESSEL 11/07/2009  . HYPERTENSION 09/28/2008  . IRRITABLE BOWEL SYNDROME 09/28/2008  . DEGENERATIVE JOINT DISEASE, KNEE 09/28/2008  . POLYMYALGIA RHEUMATICA 09/28/2008  . CHEST PAIN UNSPECIFIED 09/28/2008    PT - End of Session Activity Tolerance: Patient tolerated treatment well General Behavior During Therapy: [facility] for tasks assessed/performed  Seth Bake, PTA 12/02/2012, 4:40 PM

## 2012-12-04 ENCOUNTER — Encounter (INDEPENDENT_AMBULATORY_CARE_PROVIDER_SITE_OTHER): Payer: Self-pay | Admitting: *Deleted

## 2012-12-07 ENCOUNTER — Ambulatory Visit (HOSPITAL_COMMUNITY)
Admission: RE | Admit: 2012-12-07 | Discharge: 2012-12-07 | Disposition: A | Discharge status: Home / Self Care | Payer: Medicare Other | Source: Ambulatory Visit | Attending: *Deleted | Admitting: *Deleted

## 2012-12-07 DIAGNOSIS — R262 Difficulty in walking, not elsewhere classified: Secondary | ICD-10-CM | POA: Diagnosis not present

## 2012-12-07 DIAGNOSIS — IMO0001 Reserved for inherently not codable concepts without codable children: Secondary | ICD-10-CM | POA: Diagnosis not present

## 2012-12-07 DIAGNOSIS — I10 Essential (primary) hypertension: Secondary | ICD-10-CM | POA: Diagnosis not present

## 2012-12-07 DIAGNOSIS — M545 Low back pain: Secondary | ICD-10-CM | POA: Diagnosis not present

## 2012-12-07 NOTE — Addendum Note (Signed)
Encounter addended by: Bella Kennedy, PT on: 12/07/2012  4:52 PM<BR>     Documentation filed: Clinical Notes

## 2012-12-07 NOTE — Evaluation (Signed)
Physical Therapy Evaluation  Patient Details  Name: Brenda Hall MRN: [id number] Date of Birth: 1930-08-25  Today's Date: 12/07/2012 Time: 4098-1191 PT Time Calculation (min): 50 min Charges: MMT x 1 (4782-9562) Self care x 30' [phone])              Visit#: 10 of 12  Re-eval: 12/06/12 Assessment Diagnosis: LBP with L radicular pain Surgical Date: 07/10/12  Authorization: medicare    Authorization Visit#: 10 of 10   Past Medical History:  Past Medical History  Diagnosis Date  . Coronary atherosclerosis of unspecified type of vessel, native or graft   . Other diseases of lung, not elsewhere classified     lung nodule  . DJD (degenerative joint disease) of knee   . IBS (irritable bowel syndrome)   . Polymyalgia rheumatica   . HTN (hypertension)   . Chest pain, unspecified   . History of TIA (transient ischemic attack)   . Myocardial infarction     5 yrs ago     dr wall  cardiac  . Cancer 1988 and  2009    BCC nose and  Melanoma back  . Anemia   . CHF (congestive heart failure)   . Stroke   . Osteoporosis   . Carotid stenosis     mild  . Chronic back pain    Past Surgical History:  Past Surgical History  Procedure Laterality Date  . Lumbar laminectomy    . Removal of melanoma from back  2007  . Knee arthroscopy    . Total knee anthroplasty    . Cardiac catheterization    . Vertebroplasty  07/11/2011    Procedure: VERTEBROPLASTY;  Surgeon: Reinaldo Meeker, MD;  Location: [facility];  Service: Neurosurgery;  Laterality: N/A;  Vertebroplasty of Lumbar Two  . Spine surgery  2013    Broken back: fusion surgery  . Pr vein bypass graft,aorto-fem-pop  2010  . Joint replacement  2001 and  2011    6 knee surgeries   bil replacement  . Abdominal hysterectomy  1977    partial  . Back surgery  1982  . Tonisllectomy  1944    Subjective Symptoms/Limitations Symptoms: Pt states that she pulled her hamstring in the pool over the weekend.  Pain  Assessment Currently in Pain?: Yes Pain Score: 5  Pain Location: Leg Pain Orientation: Right;Posterior;Proximal  Precautions/Restrictions  Precautions Precautions: Other (comment) Precaution Comments: hx of cancer   Sensation/Coordination/Flexibility/Functional Tests Functional Tests Functional Tests: Oswestry 15/50  Assessment RLE Strength Right Hip Flexion: 5/5 (was 3+/5 on 11/06/12) Right Hip Extension: 3+/5 (was 2/5 on 11/06/12) Right Hip ABduction: 5/5 (was 3+/5 on 11/06/12) Right Knee Flexion: 5/5 (was 5/5 on 11/06/12) Right Knee Extension: 5/5 (was 5/5 on 11/06/12) Right Ankle Dorsiflexion: 5/5 (was 4/5 on 11/06/12) LLE Strength Left Hip Flexion: 5/5 (was 2/5 on 11/06/12) Left Hip Extension: 3/5 (was 2-/5 on 11/06/12) Left Hip ABduction: 4/5 (was 3/5 on 11/06/12) Left Knee Flexion:  (4+/5 was 3+/5 on 11/06/12) Left Knee Extension: 5/5 (was 5/5 on 11/06/12) Left Ankle Dorsiflexion: 5/5 (was 3+/5 on 11/06/12) Lumbar AROM Lumbar Flexion: WNL (was flexing at hip on 11/06/12) Lumbar Extension: WNL (was decreased 20% on 11/06/12) Lumbar - Right Side Bend: WNL (was decreased 20% on 11/06/12) Lumbar - Left Side Bend: WNL (was decreased 20% on 11/06/12) Lumbar - Right Rotation: Decreased 20% (was decreased 80% on 11/06/12) Lumbar - Left Rotation: Decreased 10% (was decreased 80% on 11/06/12)   Physical Therapy Assessment  and Plan PT Assessment and Plan Clinical Impression Statement: Pt has progressed well with therapy. All goals have been met. Pt is no longer limited by pain. Pt is independent with HEP and completing water aerobics three times a week. Pt is comfortable with D/C to HEP. PT Plan: Recommend D/C to HEP.    Goals Home Exercise Program Pt/caregiver will Perform Home Exercise Program: For increased ROM;For increased strengthening PT Short Term Goals Time to Complete Short Term Goals: 2 weeks PT Short Term Goal 1: Pt to be able to stand with forward bend of only 15 degrees (Pt  stand with only 5 degrees of hip flexion) PT Short Term Goal 1 - Progress: Met PT Short Term Goal 2: Pt to be able to walk for 15 minutes for a healthy lifestyle PT Short Term Goal 2 - Progress: Met PT Short Term Goal 3: Pt strength to be imprvoed 1/2 grade to allow the above to occur PT Short Term Goal 3 - Progress: Met PT Long Term Goals Time to Complete Long Term Goals: 4 weeks PT Long Term Goal 1: Pt to be I in advance HEP PT Long Term Goal 1 - Progress: Met PT Long Term Goal 2: Pt LE strength increased one grade to allow pt to walk for 30 minutes for healthier life style PT Long Term Goal 2 - Progress: Met Long Term Goal 3: Pt Oswestry to be improved 10 points  (15/50) Long Term Goal 3 Progress: Met Long Term Goal 4: Pt to be able to stand erect when walking Long Term Goal 4 Progress: Met  Problem List Patient Active Problem List   Diagnosis Date Noted  . Other and unspecified hyperlipidemia 11/19/2012  . Cerebrovascular accident (stroke) 11/19/2012  . Difficulty in walking(719.7) 11/06/2012  . Occlusion and stenosis of carotid artery without mention of cerebral infarction 02/07/2012  . OLD MYOCARDIAL INFARCTION 11/07/2009  . CAD, NATIVE VESSEL 11/07/2009  . HYPERTENSION 09/28/2008  . IRRITABLE BOWEL SYNDROME 09/28/2008  . DEGENERATIVE JOINT DISEASE, KNEE 09/28/2008  . POLYMYALGIA RHEUMATICA 09/28/2008  . CHEST PAIN UNSPECIFIED 09/28/2008    PT - End of Session Activity Tolerance: Patient tolerated treatment well General Behavior During Therapy: WFL for tasks assessed/performed  GP Functional Assessment Tool Used: Oswestry 15/50=30% Self Care Goal Status (Z6109): At least 1 percent but less than 20 percent impaired, limited or restricted Self Care Discharge Status [phone]): At least 20 percent but less than 40 percent impaired, limited or restricted  Seth Bake, PTA  12/07/2012, 3:44 PM  Physician Documentation Your signature is required to indicate approval of  the treatment plan as stated above.  Please sign and either send electronically or make a copy of this report for your files and return this physician signed original.   Please mark one 1.__approve of plan  2. ___approve of plan with the following conditions.   ______________________________                                                          _____________________ Physician Signature  Date  

## 2012-12-09 ENCOUNTER — Ambulatory Visit (HOSPITAL_COMMUNITY): Payer: Medicare Other | Admitting: *Deleted

## 2012-12-15 DIAGNOSIS — N3941 Urge incontinence: Secondary | ICD-10-CM | POA: Diagnosis not present

## 2012-12-15 DIAGNOSIS — R350 Frequency of micturition: Secondary | ICD-10-CM | POA: Diagnosis not present

## 2012-12-17 ENCOUNTER — Ambulatory Visit (INDEPENDENT_AMBULATORY_CARE_PROVIDER_SITE_OTHER): Payer: Medicare Other | Admitting: Internal Medicine

## 2012-12-17 ENCOUNTER — Telehealth (INDEPENDENT_AMBULATORY_CARE_PROVIDER_SITE_OTHER): Payer: Self-pay | Admitting: *Deleted

## 2012-12-17 ENCOUNTER — Encounter (INDEPENDENT_AMBULATORY_CARE_PROVIDER_SITE_OTHER): Payer: Self-pay | Admitting: Internal Medicine

## 2012-12-17 ENCOUNTER — Other Ambulatory Visit (INDEPENDENT_AMBULATORY_CARE_PROVIDER_SITE_OTHER): Payer: Self-pay | Admitting: *Deleted

## 2012-12-17 VITALS — BP 128/74 | HR 84 | Temp 97.9°F | Ht 62.0 in | Wt 158.4 lb

## 2012-12-17 DIAGNOSIS — Z1211 Encounter for screening for malignant neoplasm of colon: Secondary | ICD-10-CM

## 2012-12-17 DIAGNOSIS — K589 Irritable bowel syndrome without diarrhea: Secondary | ICD-10-CM

## 2012-12-17 MED ORDER — PEG-KCL-NACL-NASULF-NA ASC-C 100 G PO SOLR: 1.0000 | Freq: Once | ORAL | Status: DC

## 2012-12-17 NOTE — Telephone Encounter (Signed)
Patient needs movi prep 

## 2012-12-17 NOTE — Patient Instructions (Addendum)
Screening colonoscopy.The risks and benefits such as perforation, bleeding, and infection were reviewed with the patient and is agreeable. 

## 2012-12-17 NOTE — Progress Notes (Signed)
Subjective:     Patient ID: Brenda Hall, female   DOB: [birth year], 79 y.o.   MRN: [id number]  [facility] office by Dr. Lilyan Hall for IBS. She tells me she will have diarrhea terrible. In Brenda Hall she had diarrhea. She was having 5 stools a day. She takes Lomotil to control the diarrhea. The episodes will last about 3-4 days. She tells me she had 2 episodes of diarrhea in Brenda Hall. She has not had symptoms since [facility]. She is taking water aerobics now and has to be sure she has BM before she goes. She is very careful what she eats. Dairy products do not bother her. She usually has one stool a day.  She tells me she has had symptoms for over 40 yrs.  Appetite is good. No weight unintentionally. No abdominal pain. No bright red rectal bleeding or melena. She eat healthy. Her last colonoscopy was 15 yr ago and was normal. Screening.    Review of Systems see hpi Current Outpatient Prescriptions  Medication Sig Dispense Refill  . alendronate (FOSAMAX) 70 MG tablet Take 70 mg by mouth every 7 (seven) days. Wednesday; Take with a full glass of water on an empty stomach.      Marland Kitchen atorvastatin (LIPITOR) 80 MG tablet TAKE ONE TABLET DAILY AT BEDTIME  30 tablet  1  . Calcium Carbonate-Vitamin D (CALCIUM-VITAMIN D) 600-200 MG-UNIT CAPS Take 1 capsule by mouth daily.       . Cholecalciferol (VITAMIN D) 2000 UNITS tablet Take 2,000 Units by mouth daily.       . diphenoxylate-atropine (LOMOTIL) 2.5-0.025 MG per tablet TAKE 1 TABLET EVERY 4-6 HOURS AS NEEDED FOR DIARRHEA  36 tablet  3  . dipyridamole-aspirin (AGGRENOX) 200-25 MG per 12 hr capsule Take 1 capsule by mouth 2 (two) times daily.  180 capsule  4  . estrogens, conjugated, (PREMARIN) 0.3 MG tablet Take daily  90 tablet  3  . fluticasone (FLONASE) 50 MCG/ACT nasal spray USE 2 SPRAYS IN EACH NOSTRIL ONCE DAILY AS NEEDED  16 g  2  . glucosamine-chondroitin 500-400 MG tablet Take 1 tablet by mouth 3 (three) times daily.      Marland Kitchen lisinopril  (PRINIVIL,ZESTRIL) 10 MG tablet TAKE ONE (1) TABLET BY MOUTH EVERY DAY  30 tablet  12  . Magnesium 250 MG TABS Take 250 mg by mouth daily.       . metoprolol tartrate (LOPRESSOR) 25 MG tablet TAKE ONE TABLET TWICE DAILY  60 tablet  5  . NITROSTAT 0.4 MG SL tablet PLACE 1 TABLET UNDER TONGUE EVERY 5 MINUTES FOR 3 DOSES AS NEEDED.  25 tablet  3  . pantoprazole (PROTONIX) 40 MG tablet Take 40 mg by mouth daily.       Marland Kitchen Specialty Vitamins Products (VITAMINS FOR THE HAIR PO) Take 3,000 mcg by mouth.      . Zinc 50 MG CAPS Take 50 mg by mouth daily.       No current facility-administered medications for this visit.   Past Medical History  Diagnosis Date  . Coronary atherosclerosis of unspecified type of vessel, native or graft   . Other diseases of lung, not elsewhere classified     lung nodule  . DJD (degenerative joint disease) of knee   . IBS (irritable bowel syndrome)   . Polymyalgia rheumatica   . HTN (hypertension)   . Chest pain, unspecified   . History of TIA (transient ischemic attack)   . Myocardial infarction  5 yrs ago     dr wall  cardiac  . Cancer 1988 and  2009    BCC nose and  Melanoma back  . Anemia   . CHF (congestive heart failure)   . Stroke   . Osteoporosis   . Carotid stenosis     mild  . Chronic back pain    Past Surgical History  Procedure Laterality Date  . Lumbar laminectomy    . Removal of melanoma from back  2007  . Knee arthroscopy    . Total knee anthroplasty    . Cardiac catheterization    . Vertebroplasty  07/11/2011    Procedure: VERTEBROPLASTY;  Surgeon: Reinaldo Meeker, MD;  Location: [facility];  Service: Neurosurgery;  Laterality: N/A;  Vertebroplasty of Lumbar Two  . Spine surgery  2013    Broken back: fusion surgery  . Pr vein bypass graft,aorto-fem-pop  2010  . Joint replacement  2001 and  2011    6 knee surgeries   bil replacement  . Abdominal hysterectomy  1977    partial  . Back surgery  1982  . Tonisllectomy  1944    Allergies  Allergen Reactions  . Epinephrine Other (See Comments)    During dental procedure-caused facial swelling at injection site  . Clopidogrel Bisulfate Itching and Rash        Objective:   Physical Exam  Filed Vitals:   12/17/12 1013  BP: 128/74  Pulse: 84  Temp: 97.9 F (36.6 C)  Height: 5\' 2"  (1.575 m)  Weight: 158 lb 6.4 oz (71.85 kg)   Alert and oriented. Skin warm and dry. Oral mucosa is moist.   . Sclera anicteric, conjunctivae is pink. Thyroid not enlarged. No cervical lymphadenopathy. Lungs clear. Heart regular rate and rhythm.  Abdomen is soft. Bowel sounds are positive. No hepatomegaly. No abdominal masses felt. No tenderness.  No edema to lower extremities.       Assessment:    IBS diarrhea predominant. She has a hx of and is maintained on Lomotil.    Plan:    Screening colonoscopy. I would like to switch her to Imodium for her IBS when she has diarrhea. She should take it twice a day.

## 2013-01-09 ENCOUNTER — Other Ambulatory Visit: Payer: Self-pay | Admitting: Family Medicine

## 2013-01-12 ENCOUNTER — Encounter (HOSPITAL_COMMUNITY): Payer: Self-pay | Admitting: Pharmacy Technician

## 2013-01-12 DIAGNOSIS — Z23 Encounter for immunization: Secondary | ICD-10-CM | POA: Diagnosis not present

## 2013-01-19 DIAGNOSIS — N3941 Urge incontinence: Secondary | ICD-10-CM | POA: Diagnosis not present

## 2013-01-27 ENCOUNTER — Encounter (HOSPITAL_COMMUNITY)
Admission: RE | Disposition: A | Discharge status: Home / Self Care | Payer: Self-pay | Source: Ambulatory Visit | Attending: Internal Medicine

## 2013-01-27 ENCOUNTER — Encounter (HOSPITAL_COMMUNITY): Payer: Self-pay | Admitting: *Deleted

## 2013-01-27 ENCOUNTER — Ambulatory Visit (HOSPITAL_COMMUNITY)
Admission: RE | Admit: 2013-01-27 | Discharge: 2013-01-27 | Disposition: A | Discharge status: Home / Self Care | Payer: Medicare Other | Source: Ambulatory Visit | Attending: Internal Medicine | Admitting: Internal Medicine

## 2013-01-27 DIAGNOSIS — K5730 Diverticulosis of large intestine without perforation or abscess without bleeding: Secondary | ICD-10-CM | POA: Diagnosis not present

## 2013-01-27 DIAGNOSIS — K644 Residual hemorrhoidal skin tags: Secondary | ICD-10-CM | POA: Insufficient documentation

## 2013-01-27 DIAGNOSIS — Z1211 Encounter for screening for malignant neoplasm of colon: Secondary | ICD-10-CM | POA: Diagnosis not present

## 2013-01-27 DIAGNOSIS — D126 Benign neoplasm of colon, unspecified: Secondary | ICD-10-CM | POA: Diagnosis not present

## 2013-01-27 DIAGNOSIS — I10 Essential (primary) hypertension: Secondary | ICD-10-CM | POA: Insufficient documentation

## 2013-01-27 HISTORY — PX: COLONOSCOPY: SHX5424

## 2013-01-27 SURGERY — COLONOSCOPY
Anesthesia: Moderate Sedation

## 2013-01-27 MED ORDER — MIDAZOLAM HCL 5 MG/5ML IJ SOLN
INTRAMUSCULAR | Status: AC
  Filled 2013-01-27: qty 10

## 2013-01-27 MED ORDER — MEPERIDINE HCL 50 MG/ML IJ SOLN
INTRAMUSCULAR | Status: AC
  Filled 2013-01-27: qty 1

## 2013-01-27 MED ORDER — MIDAZOLAM HCL 5 MG/5ML IJ SOLN
INTRAMUSCULAR | Status: DC | PRN
  Administered 2013-01-27: 2 mg via INTRAVENOUS
  Administered 2013-01-27: 1 mg via INTRAVENOUS

## 2013-01-27 MED ORDER — MEPERIDINE HCL 50 MG/ML IJ SOLN
INTRAMUSCULAR | Status: DC | PRN
  Administered 2013-01-27 (×2): 25 mg via INTRAVENOUS

## 2013-01-27 MED ORDER — SODIUM CHLORIDE 0.9 % IV SOLN: INTRAVENOUS | Status: DC

## 2013-01-27 MED ORDER — STERILE WATER FOR IRRIGATION IR SOLN
Status: DC | PRN
  Administered 2013-01-27: 11:00:00

## 2013-01-27 NOTE — H&P (Signed)
Brenda Hall is an 79 y.o. female.   Chief Complaint: Patient is  here for colonoscopy. HPI: This 79 year old Caucasian female who is in for average risk screening colonoscopy. Patient's last exam was 15 years ago. She has intermittent diarrhea felt to be due to irritable bowel syndrome. Cardia jennies well-controlled with Lomotil and/or Imodium. She has good appetite. She exercises regularly and does water aerobics 3-4 times a week. She has lost 20 pounds which is felt to be voluntary. She denies abdominal pain or rectal bleeding. Family history is negative for CRC.  Past Medical History  Diagnosis Date  . Coronary atherosclerosis of unspecified type of vessel, native or graft   . Other diseases of lung, not elsewhere classified     lung nodule  . DJD (degenerative joint disease) of knee   . IBS (irritable bowel syndrome)   . Polymyalgia rheumatica   . HTN (hypertension)   . Chest pain, unspecified   . History of TIA (transient ischemic attack)   . Myocardial infarction     5 yrs ago     dr wall  cardiac  . Cancer 1988 and  2009    BCC nose and  Melanoma back  . Anemia   . CHF (congestive heart failure)   . Stroke   . Osteoporosis   . Carotid stenosis     mild  . Chronic back pain     Past Surgical History  Procedure Laterality Date  . Lumbar laminectomy    . Removal of melanoma from back  2007  . Knee arthroscopy    . Total knee anthroplasty    . Cardiac catheterization    . Vertebroplasty  07/11/2011    Procedure: VERTEBROPLASTY;  Surgeon: Reinaldo Meeker, MD;  Location: [facility];  Service: Neurosurgery;  Laterality: N/A;  Vertebroplasty of Lumbar Two  . Spine surgery  2013    Broken back: fusion surgery  . Pr vein bypass graft,aorto-fem-pop  2010  . Joint replacement  2001 and  2011    6 knee surgeries   bil replacement  . Abdominal hysterectomy  1977    partial  . Back surgery  1982  . Tonisllectomy  1944    Family History  Problem Relation Age of  Onset  . Heart disease Father   . Cancer Mother     bladder   . Colon cancer Neg Hx    Social History:  reports that she has never smoked. She has never used smokeless tobacco. She reports that she drinks alcohol. She reports that she does not use illicit drugs.  Allergies:  Allergies  Allergen Reactions  . Epinephrine Other (See Comments)    During dental procedure-caused facial swelling at injection site. Pt has had epinephrine since then for nose and facial surgery and did fine with it per pt.  . Clopidogrel Bisulfate Itching and Rash    Medications Prior to Admission  Medication Sig Dispense Refill  . atorvastatin (LIPITOR) 80 MG tablet Take 80 mg by mouth daily.      . Cholecalciferol (VITAMIN D) 2000 UNITS tablet Take 2,000 Units by mouth daily.       Marland Kitchen estrogens, conjugated, (PREMARIN) 0.3 MG tablet Take 0.3 mg by mouth daily. Take daily for 21 days then do not take for 7 days.      . fluticasone (FLONASE) 50 MCG/ACT nasal spray Place 2 sprays into the nose daily as needed for rhinitis.      Marland Kitchen lisinopril (PRINIVIL,ZESTRIL) 10  MG tablet Take 10 mg by mouth daily.      Marland Kitchen loperamide (IMODIUM A-D) 2 MG tablet Take 2 mg by mouth 4 (four) times daily as needed for diarrhea or loose stools.      . metoprolol tartrate (LOPRESSOR) 25 MG tablet Take 25 mg by mouth 2 (two) times daily.      . pantoprazole (PROTONIX) 40 MG tablet Take 40 mg by mouth daily.       . peg 3350 powder (MOVIPREP) 100 G SOLR Take 1 kit (200 g total) by mouth once.  1 kit  0  . Specialty Vitamins Products (VITAMINS FOR THE HAIR PO) Take 3,000 mcg by mouth.      . Zinc 50 MG CAPS Take 50 mg by mouth daily.      Marland Kitchen alendronate (FOSAMAX) 70 MG tablet Take 70 mg by mouth every 7 (seven) days. Wednesday; Take with a full glass of water on an empty stomach.      . Calcium Carbonate-Vitamin D (CALCIUM-VITAMIN D) 600-200 MG-UNIT CAPS Take 1 capsule by mouth daily.       . diphenoxylate-atropine (LOMOTIL) 2.5-0.025 MG per  tablet Take 1 tablet by mouth 4 (four) times daily as needed for diarrhea or loose stools.      . dipyridamole-aspirin (AGGRENOX) 200-25 MG per 12 hr capsule Take 1 capsule by mouth daily.      Marland Kitchen glucosamine-chondroitin 500-400 MG tablet Take 1 tablet by mouth daily.      . Magnesium 250 MG TABS Take 250 mg by mouth daily.       . nitroGLYCERIN (NITROSTAT) 0.4 MG SL tablet Place 0.4 mg under the tongue every 5 (five) minutes as needed for chest pain.        No results found for this or any previous visit (from the past 48 hour(s)). No results found.  ROS  Blood pressure 169/66, pulse 50, temperature 98.1 F (36.7 C), temperature source Oral, resp. rate 15, height 5\' 3"  (1.6 m), weight 150 lb (68.04 kg), SpO2 98.00%. Physical Exam  Constitutional: She appears well-developed and well-nourished.  HENT:  Mouth/Throat: Oropharynx is clear and moist.  Eyes: Conjunctivae are normal. No scleral icterus.  Neck: No thyromegaly present.  Cardiovascular: Normal rate, regular rhythm and normal heart sounds.   No murmur heard. Respiratory: Effort normal and breath sounds normal.  GI: Soft. She exhibits no distension and no mass. There is no tenderness.  Musculoskeletal: She exhibits no edema.  Lymphadenopathy:    She has no cervical adenopathy.  Neurological: She is alert.  Skin: Skin is warm and dry.     Assessment/Plan Average risk screening colonoscopy. Chronic/intermittent diarrhea felt to be secondary to IBS.  Brenda Hall U 01/27/2013, 10:38 AM

## 2013-01-27 NOTE — Op Note (Signed)
COLONOSCOPY PROCEDURE REPORT  PATIENT:  Brenda Hall  MR#:  [id number] Birthdate:  August 16, 1930, 79 y.o., female Endoscopist:  Dr. Malissa Hippo, MD Referred By:  Dr. Vilinda Blanks. Gerda Diss, MD Procedure Date: 01/27/2013  Procedure:   Colonoscopy  Indications:  Patient is a-year-old Caucasian female is undergoing average risk screening colonoscopy. Her last exam was 15 years ago. She has intermittent diarrhea felt to be secondary to IBS.  Informed Consent:  The procedure and risks were reviewed with the patient and informed consent was obtained.  Medications:  Demerol 50 mg IV Versed 3 mg IV  Description of procedure:  After a digital rectal exam was performed, that colonoscope was advanced from the anus through the rectum and colon to the area of the cecum, ileocecal valve and appendiceal orifice. The cecum was deeply intubated. These structures were well-seen and photographed for the record. From the level of the cecum and ileocecal valve, the scope was slowly and cautiously withdrawn. The mucosal surfaces were carefully surveyed utilizing scope tip to flexion to facilitate fold flattening as needed. The scope was pulled down into the rectum where a thorough exam including retroflexion was performed.  Findings:   Prep excellent. Small cecal polyp ablated while cold biopsy. Few small diverticula at sigmoid colon. Random biopsies taken from mucosa of sigmoid colon. Normal rectal mucosa. Small hemorrhoids below the dentate line.    Therapeutic/Diagnostic Maneuvers Performed:  See above  Complications:  None  Cecal Withdrawal Time:  16 minutes  Impression:  Examination performed to cecum. Small cecal polyp ablated via cold biopsy. Few small diverticula at sigmoid colon. Random biopsies taken from mucosa of sigmoid colon looking for collagenous or microscopic colitis. Small external hemorrhoids.  Recommendations:  Standard instructions given. I will contact patient with  biopsy results and further recommendations.  Tulio Facundo U  01/27/2013 11:22 AM  CC: Dr. Harlow Asa, MD & Dr. Bonnetta Barry ref. provider found

## 2013-01-30 ENCOUNTER — Other Ambulatory Visit: Payer: Self-pay | Admitting: Family Medicine

## 2013-02-01 ENCOUNTER — Encounter (HOSPITAL_COMMUNITY): Payer: Self-pay | Admitting: Internal Medicine

## 2013-02-02 ENCOUNTER — Other Ambulatory Visit: Payer: Self-pay | Admitting: Dermatology

## 2013-02-02 DIAGNOSIS — L570 Actinic keratosis: Secondary | ICD-10-CM | POA: Diagnosis not present

## 2013-02-02 DIAGNOSIS — L738 Other specified follicular disorders: Secondary | ICD-10-CM | POA: Diagnosis not present

## 2013-02-02 DIAGNOSIS — D485 Neoplasm of uncertain behavior of skin: Secondary | ICD-10-CM | POA: Diagnosis not present

## 2013-02-03 ENCOUNTER — Telehealth: Payer: Self-pay | Admitting: Family Medicine

## 2013-02-03 NOTE — Telephone Encounter (Signed)
Pt wants to know at her age, does she need to go yearly to get a breast exam? She had one last year this time and they are sending her a letter to schedule for another. She thinks she can go every other year at this point in her life? Please adives

## 2013-02-03 NOTE — Telephone Encounter (Signed)
Notified patient that it is recommended to have a screening mammogram done every year for age 77 and over. She stated that she will have it done.

## 2013-02-12 ENCOUNTER — Other Ambulatory Visit: Payer: Medicare Other

## 2013-02-12 ENCOUNTER — Ambulatory Visit: Payer: Medicare Other | Admitting: Family

## 2013-02-16 ENCOUNTER — Encounter (INDEPENDENT_AMBULATORY_CARE_PROVIDER_SITE_OTHER): Payer: Self-pay | Admitting: *Deleted

## 2013-02-16 DIAGNOSIS — H2510 Age-related nuclear cataract, unspecified eye: Secondary | ICD-10-CM | POA: Diagnosis not present

## 2013-02-16 DIAGNOSIS — N3941 Urge incontinence: Secondary | ICD-10-CM | POA: Diagnosis not present

## 2013-02-19 ENCOUNTER — Ambulatory Visit: Payer: Medicare Other | Admitting: Family

## 2013-02-19 ENCOUNTER — Other Ambulatory Visit: Payer: Self-pay

## 2013-02-19 ENCOUNTER — Other Ambulatory Visit (HOSPITAL_COMMUNITY): Payer: Medicare Other

## 2013-02-19 DIAGNOSIS — Z1231 Encounter for screening mammogram for malignant neoplasm of breast: Secondary | ICD-10-CM

## 2013-02-26 ENCOUNTER — Ambulatory Visit: Payer: Medicare Other | Admitting: Family

## 2013-02-26 ENCOUNTER — Other Ambulatory Visit (HOSPITAL_COMMUNITY): Payer: Medicare Other

## 2013-03-02 ENCOUNTER — Encounter: Payer: Self-pay | Admitting: Family

## 2013-03-03 ENCOUNTER — Encounter: Payer: Self-pay | Admitting: Family

## 2013-03-03 ENCOUNTER — Encounter (INDEPENDENT_AMBULATORY_CARE_PROVIDER_SITE_OTHER): Payer: Self-pay

## 2013-03-03 ENCOUNTER — Ambulatory Visit (HOSPITAL_COMMUNITY)
Admission: RE | Admit: 2013-03-03 | Discharge: 2013-03-03 | Disposition: A | Discharge status: Home / Self Care | Payer: Medicare Other | Source: Ambulatory Visit | Attending: Family | Admitting: Family

## 2013-03-03 ENCOUNTER — Ambulatory Visit (INDEPENDENT_AMBULATORY_CARE_PROVIDER_SITE_OTHER): Payer: Medicare Other | Admitting: Family

## 2013-03-03 DIAGNOSIS — I6529 Occlusion and stenosis of unspecified carotid artery: Secondary | ICD-10-CM | POA: Diagnosis not present

## 2013-03-03 NOTE — Patient Instructions (Signed)
Stroke Prevention Some medical conditions and behaviors are associated with an increased chance of having a stroke. You may prevent a stroke by making healthy choices and managing medical conditions. Reduce your risk of having a stroke by:  Staying physically active. Get at least 30 minutes of activity on most or all days.  Not smoking. It may also be helpful to avoid exposure to secondhand smoke.  Limiting alcohol use. Moderate alcohol use is considered to be:  No more than 2 drinks per day for men.  No more than 1 drink per day for nonpregnant women.  Eating healthy foods.  Include 5 or more servings of fruits and vegetables a day.  Certain diets may be prescribed to address high blood pressure, high cholesterol, diabetes, or obesity.  Managing your cholesterol levels.  A low-saturated fat, low-trans fat, low-cholesterol, and high-fiber diet may control cholesterol levels.  Take any prescribed medicines to control cholesterol as directed by your caregiver.  Managing your diabetes.  A controlled-carbohydrate, controlled-sugar diet is recommended to manage diabetes.  Take any prescribed medicines to control diabetes as directed by your caregiver.  Controlling your high blood pressure (hypertension).  A low-salt (sodium), low-saturated fat, low-trans fat, and low-cholesterol diet is recommended to manage high blood pressure.  Take any prescribed medicines to control hypertension as directed by your caregiver.  Maintaining a healthy weight.  A reduced-calorie, low-sodium, low-saturated fat, low-trans fat, low-cholesterol diet is recommended to manage weight.  Stopping drug abuse.  Avoiding birth control pills.  Talk to your caregiver about the risks of taking birth control pills if you are over 35 years old, smoke, get migraines, or have ever had a blood clot.  Getting evaluated for sleep disorders (sleep apnea).  Talk to your caregiver about getting a sleep evaluation  if you snore a lot or have excessive sleepiness.  Taking medicines as directed by your caregiver.  For some people, aspirin or blood thinners (anticoagulants) are helpful in reducing the risk of forming abnormal blood clots that can lead to stroke. If you have the irregular heart rhythm of atrial fibrillation, you should be on a blood thinner unless there is a good reason you cannot take them.  Understand all your medicine instructions. SEEK IMMEDIATE MEDICAL CARE IF:   You have sudden weakness or numbness of the face, arm, or leg, especially on one side of the body.  You have sudden confusion.  You have trouble speaking (aphasia) or understanding.  You have sudden trouble seeing in one or both eyes.  You have sudden trouble walking.  You have dizziness.  You have a loss of balance or coordination.  You have a sudden, severe headache with no known cause.  You have new chest pain or an irregular heartbeat. Any of these symptoms may represent a serious problem that is an emergency. Do not wait to see if the symptoms will go away. Get medical help right away. Call your local emergency services (911 in [place] Do not drive yourself to the hospital. Document Released: 05/16/2004 Document Revised: 07/01/2011 Document Reviewed: 10/09/2012 [employer] Patient Information 2014 [employer].  

## 2013-03-03 NOTE — Progress Notes (Signed)
Established Carotid Patient  History of Present Illness  Brenda Hall is a 79 y.o. female patient of Dr. Imogene Burn with no carotid intervention history. She exercises in the water 3x/week and feels well. Had "minor stroke" about 2010 as manifested by right hand to arm weakness and pain that then radiated to right leg, her symptoms lasted less than 30 minutes, has no residual effects from stroke, no amaurosis fugax, no facial drooping no slurred speech or aphasia. Had MI about 2008, had cardiac stent placed.  reports New Medical or Surgical History: basal cell CA removed from face and scalp with skin grafting. Also had kyphoplasty last year.  Pt Diabetic: No Pt smoker: non-smoker  Pt meds include: Statin : Yes ASA: Yes Other anticoagulants/antiplatelets: no: allergic to Plavix   Past Medical History  Diagnosis Date  . Coronary atherosclerosis of unspecified type of vessel, native or graft   . Other diseases of lung, not elsewhere classified     lung nodule  . DJD (degenerative joint disease) of knee   . IBS (irritable bowel syndrome)   . Polymyalgia rheumatica   . HTN (hypertension)   . Chest pain, unspecified   . History of TIA (transient ischemic attack)   . Myocardial infarction     5 yrs ago     dr wall  cardiac  . Anemia   . CHF (congestive heart failure)   . Stroke   . Osteoporosis   . Carotid stenosis     mild  . Chronic back pain   . Cancer 1988 and  2009    BCC nose and  Melanoma back  . Cancer of skin Jan. 2014    [facility] Forehead and Nose    Social History History  Substance Use Topics  . Smoking status: Never Smoker   . Smokeless tobacco: Never Used     Comment: tobacco use - no  . Alcohol Use: Yes     Comment: occasionally a glass of wine or margarita per pt    Family History Family History  Problem Relation Age of Onset  . Heart disease Father   . Hypertension Father   . Heart attack Father   . Cancer Mother     bladder   . Colon cancer Neg  Hx   . Hypertension Daughter   . Hypertension Son     Surgical History Past Surgical History  Procedure Laterality Date  . Lumbar laminectomy    . Removal of melanoma from back  2007  . Knee arthroscopy    . Total knee anthroplasty    . Cardiac catheterization    . Vertebroplasty  07/11/2011    Procedure: VERTEBROPLASTY;  Surgeon: Reinaldo Meeker, MD;  Location: [facility];  Service: Neurosurgery;  Laterality: N/A;  Vertebroplasty of Lumbar Two  . Spine surgery  2013    Broken back: fusion surgery  . Pr vein bypass graft,aorto-fem-pop  2010  . Abdominal hysterectomy  1977    partial  . Back surgery  1982  . Tonisllectomy  1944  . Colonoscopy N/A 01/27/2013    Procedure: COLONOSCOPY;  Surgeon: Malissa Hippo, MD;  Location: [facility];  Service: Endoscopy;  Laterality: N/A;  1030  . Joint replacement  2001 and  2011    6 knee surgeries   bil replacement    Allergies  Allergen Reactions  . Epinephrine Other (See Comments)    During dental procedure-caused facial swelling at injection site. Pt has had epinephrine since then  for nose and facial surgery and did fine with it per pt.  . Clopidogrel Bisulfate Itching and Rash    Plavix    Current Outpatient Prescriptions  Medication Sig Dispense Refill  . alendronate (FOSAMAX) 70 MG tablet Take 70 mg by mouth every 7 (seven) days. Wednesday; Take with a full glass of water on an empty stomach.      Marland Kitchen atorvastatin (LIPITOR) 80 MG tablet TAKE ONE (1) TABLET AT BEDTIME  30 tablet  3  . Calcium Carbonate-Vitamin D (CALCIUM-VITAMIN D) 600-200 MG-UNIT CAPS Take 1 capsule by mouth daily.       . Cholecalciferol (VITAMIN D) 2000 UNITS tablet Take 2,000 Units by mouth daily.       Marland Kitchen dipyridamole-aspirin (AGGRENOX) 200-25 MG per 12 hr capsule Take 1 capsule by mouth daily.      Marland Kitchen estrogens, conjugated, (PREMARIN) 0.3 MG tablet Take 0.3 mg by mouth daily. Take daily for 21 days then do not take for 7 days.      . fluticasone (FLONASE) 50  MCG/ACT nasal spray Place 2 sprays into the nose daily as needed for rhinitis.      Marland Kitchen glucosamine-chondroitin 500-400 MG tablet Take 1 tablet by mouth daily.      Marland Kitchen lisinopril (PRINIVIL,ZESTRIL) 10 MG tablet Take 10 mg by mouth daily.      Marland Kitchen loperamide (IMODIUM A-D) 2 MG tablet Take 2 mg by mouth 4 (four) times daily as needed for diarrhea or loose stools.      . Magnesium 250 MG TABS Take 250 mg by mouth daily.       . metoprolol tartrate (LOPRESSOR) 25 MG tablet Take 25 mg by mouth 2 (two) times daily.      . nitroGLYCERIN (NITROSTAT) 0.4 MG SL tablet Place 0.4 mg under the tongue every 5 (five) minutes as needed for chest pain.      . pantoprazole (PROTONIX) 40 MG tablet Take 40 mg by mouth daily.       Marland Kitchen Specialty Vitamins Products (VITAMINS FOR THE HAIR PO) Take 3,000 mcg by mouth.      . Zinc 50 MG CAPS Take 50 mg by mouth daily.      Marland Kitchen atorvastatin (LIPITOR) 80 MG tablet Take 80 mg by mouth daily.      . diphenoxylate-atropine (LOMOTIL) 2.5-0.025 MG per tablet Take 1 tablet by mouth 4 (four) times daily as needed for diarrhea or loose stools.       No current facility-administered medications for this visit.    Review of Systems : [x]  Positive   [ ]  Denies  General:[ ]  Weight loss,  [ ]  Weight gain, [ ]  Loss of appetite, [ ]  Fever, [ ]  chills  Neurologic: [ ]  Dizziness, [ ]  Blackouts, [ ]  Headaches, [ ]  Seizure [ ]  Stroke, [ ]  "Mini stroke", [ ]  Slurred speech, [ ]  Temporary blindness;  [ ] weakness,  Ear/Nose/Throat: [ ]  Change in hearing, [ ]  Nose bleeds, [ ]  Hoarseness  Vascular:[ ]  Pain in legs with walking, [ ]  Pain in feet while lying flat , [ ]   Non-healing ulcer, [ ]  Blood clot in vein,    Pulmonary: [ ]  Home oxygen, [ ]   Productive cough, [ ]  Bronchitis, [ ]  Coughing up blood,  [ ]  Asthma, [ ]  Wheezing  Musculoskeletal:  [ ]  Arthritis, [ ]  Joint pain, [ ]  low back pain  Cardiac: [ ]  Chest pain, [ ]  Shortness of breath when lying flat, [ ]  Shortness  of breath with  exertion, [ ]  Palpitations, [ ]  Heart murmur, [ ]   Atrial fibrillation  Hematologic:[ ]  Easy Bruising, [ ]  Anemia; [ ]  Hepatitis  Psychiatric: [ ]   Depression, [ ]  Anxiety   Gastrointestinal: [ ]  Black stool, [ ]  Blood in stool, [ ]  Peptic ulcer disease,  [ ]  Gastroesophageal Reflux, [ ]  Trouble swallowing, [ ]  Diarrhea, [ ]  Constipation  Urinary: [ ]  chronic Kidney disease, [ ]  on HD, [ ]  Burning with urination, [ ]  Frequent urination, [ ]  Difficulty urinating;   Skin: [ ]  Rashes, [ ]  Wounds    Physical Examination  Filed Vitals:   03/03/13 1534  BP: 155/63  Pulse: 61  Resp: 16   Filed Weights   03/03/13 1534  Weight: 156 lb (70.761 kg)   Body mass index is 27.64 kg/(m^2).   General: WDWN female in NAD GAIT: normal Eyes: PERRLA Pulmonary:  CTAB, Negative  Rales, Negative rhonchi, & Negative wheezing.  Cardiac: regular Rhythm ,  Negative Murmurs.  VASCULAR EXAM Carotid Bruits Left Right   Negative Negative  Radial pulses are 1+ palpable and equal.                                                                                                                            LE Pulses LEFT RIGHT       POPLITEAL   palpable    palpable       POSTERIOR TIBIAL  not palpable    palpable        DORSALIS PEDIS      ANTERIOR TIBIAL  palpable   palpable     Gastrointestinal: soft, nontender, BS WNL, no r/g,  negative masses.  Musculoskeletal: Negative muscle atrophy/wasting. M/S 5/5 throughout, Extremities without ischemic changes.  Neurologic: A&O X 3; Appropriate Affect ; SENSATION ;normal;  Speech is normal CN 2-12 intact, Pain and light touch intact in extremities, Motor exam as listed above.   Non-Invasive Vascular Imaging CAROTID DUPLEX 03/03/2013   Right ICA: <40% stenosis. Left ICA: <40% stenosis.  These findings are Unchanged from the last 2 years.  Assessment: Brenda Hall is a 79 y.o. female who presents with asymptomatic minimal bilateral  carotid artery plaque, unchanged in the last 2 years. She has had no carotid intervention and has minimal plaque in both internal carotid arteries; therefore it is unlikely that her stroke was precipitated by minimal atherosclerosis of the internal carotid arteries. She tried stopping Premarin on advice, since Premarin increases risk of stroke, but could not tolerate the hot flashes; states she is now on the lowest dose; discussed considering transdermal estradiol (has had a hysterectomy for menorrhagia) since it bypasses the liver and is not associated with the increased risk of CVA as is oral HRT; states she will discuss this with her PCP or gynecologist.  Plan: Follow-up in 1 year with Carotid Duplex scan.   I discussed in depth with the patient the nature of  atherosclerosis, and emphasized the importance of maximal medical management including strict control of blood pressure, blood glucose, and lipid levels, obtaining regular exercise, and continued cessation of smoking.  The patient is aware that without maximal medical management the underlying atherosclerotic disease process will progress, limiting the benefit of any interventions. The patient was given information about stroke prevention and what symptoms should prompt the patient to seek immediate medical care. Thank you for allowing [place] to participate in this patient's care.  Charisse March, RN, MSN, FNP-C [facility] Office: [phone]  Clinic Physician: Edilia Bo  03/03/2013 4:07 PM

## 2013-03-08 ENCOUNTER — Emergency Department (HOSPITAL_COMMUNITY): Payer: Medicare Other

## 2013-03-08 ENCOUNTER — Emergency Department (HOSPITAL_COMMUNITY)
Admission: EM | Admit: 2013-03-08 | Discharge: 2013-03-08 | Disposition: A | Discharge status: Home / Self Care | Payer: Medicare Other | Attending: Emergency Medicine | Admitting: Emergency Medicine

## 2013-03-08 ENCOUNTER — Encounter (HOSPITAL_COMMUNITY): Payer: Self-pay | Admitting: Emergency Medicine

## 2013-03-08 DIAGNOSIS — Y9229 Other specified public building as the place of occurrence of the external cause: Secondary | ICD-10-CM | POA: Insufficient documentation

## 2013-03-08 DIAGNOSIS — Z8673 Personal history of transient ischemic attack (TIA), and cerebral infarction without residual deficits: Secondary | ICD-10-CM | POA: Insufficient documentation

## 2013-03-08 DIAGNOSIS — Z79899 Other long term (current) drug therapy: Secondary | ICD-10-CM | POA: Diagnosis not present

## 2013-03-08 DIAGNOSIS — T1490XA Injury, unspecified, initial encounter: Secondary | ICD-10-CM | POA: Diagnosis not present

## 2013-03-08 DIAGNOSIS — M810 Age-related osteoporosis without current pathological fracture: Secondary | ICD-10-CM | POA: Insufficient documentation

## 2013-03-08 DIAGNOSIS — IMO0002 Reserved for concepts with insufficient information to code with codable children: Secondary | ICD-10-CM | POA: Diagnosis not present

## 2013-03-08 DIAGNOSIS — W010XXA Fall on same level from slipping, tripping and stumbling without subsequent striking against object, initial encounter: Secondary | ICD-10-CM | POA: Insufficient documentation

## 2013-03-08 DIAGNOSIS — S0003XA Contusion of scalp, initial encounter: Secondary | ICD-10-CM | POA: Insufficient documentation

## 2013-03-08 DIAGNOSIS — I2510 Atherosclerotic heart disease of native coronary artery without angina pectoris: Secondary | ICD-10-CM | POA: Diagnosis not present

## 2013-03-08 DIAGNOSIS — I10 Essential (primary) hypertension: Secondary | ICD-10-CM | POA: Diagnosis not present

## 2013-03-08 DIAGNOSIS — I509 Heart failure, unspecified: Secondary | ICD-10-CM | POA: Insufficient documentation

## 2013-03-08 DIAGNOSIS — Z8709 Personal history of other diseases of the respiratory system: Secondary | ICD-10-CM | POA: Diagnosis not present

## 2013-03-08 DIAGNOSIS — I252 Old myocardial infarction: Secondary | ICD-10-CM | POA: Insufficient documentation

## 2013-03-08 DIAGNOSIS — Z23 Encounter for immunization: Secondary | ICD-10-CM | POA: Diagnosis not present

## 2013-03-08 DIAGNOSIS — Z951 Presence of aortocoronary bypass graft: Secondary | ICD-10-CM | POA: Insufficient documentation

## 2013-03-08 DIAGNOSIS — Z85820 Personal history of malignant melanoma of skin: Secondary | ICD-10-CM | POA: Diagnosis not present

## 2013-03-08 DIAGNOSIS — S0190XA Unspecified open wound of unspecified part of head, initial encounter: Secondary | ICD-10-CM | POA: Diagnosis not present

## 2013-03-08 DIAGNOSIS — Z8719 Personal history of other diseases of the digestive system: Secondary | ICD-10-CM | POA: Insufficient documentation

## 2013-03-08 DIAGNOSIS — Y939 Activity, unspecified: Secondary | ICD-10-CM | POA: Insufficient documentation

## 2013-03-08 DIAGNOSIS — Z862 Personal history of diseases of the blood and blood-forming organs and certain disorders involving the immune mechanism: Secondary | ICD-10-CM | POA: Diagnosis not present

## 2013-03-08 DIAGNOSIS — S0100XA Unspecified open wound of scalp, initial encounter: Secondary | ICD-10-CM | POA: Diagnosis not present

## 2013-03-08 DIAGNOSIS — S298XXA Other specified injuries of thorax, initial encounter: Secondary | ICD-10-CM | POA: Insufficient documentation

## 2013-03-08 DIAGNOSIS — S0993XA Unspecified injury of face, initial encounter: Secondary | ICD-10-CM | POA: Diagnosis not present

## 2013-03-08 DIAGNOSIS — S0101XA Laceration without foreign body of scalp, initial encounter: Secondary | ICD-10-CM

## 2013-03-08 DIAGNOSIS — G8929 Other chronic pain: Secondary | ICD-10-CM | POA: Insufficient documentation

## 2013-03-08 LAB — PROTIME-INR: INR: 0.95 (ref 0.00–1.49)

## 2013-03-08 MED ORDER — TETANUS-DIPHTH-ACELL PERTUSSIS 5-2.5-18.5 LF-MCG/0.5 IM SUSP
INTRAMUSCULAR | Status: AC
  Administered 2013-03-08: 0.5 mL via INTRAMUSCULAR
  Filled 2013-03-08: qty 0.5

## 2013-03-08 MED ORDER — ACETAMINOPHEN 325 MG PO TABS
650.0000 mg | ORAL_TABLET | Freq: Once | ORAL | Status: AC
  Administered 2013-03-08: 650 mg via ORAL
  Filled 2013-03-08: qty 2

## 2013-03-08 MED ORDER — LIDOCAINE HCL (PF) 1 % IJ SOLN
5.0000 mL | Freq: Once | INTRAMUSCULAR | Status: AC
  Administered 2013-03-08: 5 mL
  Filled 2013-03-08: qty 5

## 2013-03-08 NOTE — ED Notes (Signed)
Ice pack to scalp, [employer] PA, placed staples in scalp. Up to Br with assistance.

## 2013-03-08 NOTE — ED Provider Notes (Signed)
CSN: [id number]     Arrival date & time 03/08/13  1716 History   First MD Initiated Contact with Patient 03/08/13 1756     Chief Complaint  Patient presents with  . Laceration   (Consider location/radiation/quality/duration/timing/severity/associated sxs/prior Treatment) HPI Comments: Patient is an 79 year old female with a history of coronary atherosclerosis. Degenerative joint disease, transient ischemic attack, stroke, carotid stenosis, chronic back pain, skin cancer involving the face, and knee problems requiring arthroscopy.  The patient states that while at a pet store she slipped on a wet spot and hit her head. Patient denies neck pain, rib pain, lower extremity pain. She does complain of soreness of the lower back, but no actual pain. It is of note that the patient is on Aggrenox. The patient denies any loss of consciousness. Patient states she was ambulatory at the scene. There's been no nausea or vomiting. There's been no loss of consciousness since the accident. There's been no change in the patient's general neurologic form. Patient is unsure of the date of the last tetanus shot.  The history is provided by the patient.    Past Medical History  Diagnosis Date  . Coronary atherosclerosis of unspecified type of vessel, native or graft   . Other diseases of lung, not elsewhere classified     lung nodule  . DJD (degenerative joint disease) of knee   . IBS (irritable bowel syndrome)   . Polymyalgia rheumatica   . HTN (hypertension)   . Chest pain, unspecified   . History of TIA (transient ischemic attack)   . Myocardial infarction     5 yrs ago     dr wall  cardiac  . Anemia   . CHF (congestive heart failure)   . Stroke   . Osteoporosis   . Carotid stenosis     mild  . Chronic back pain   . Cancer 1988 and  2009    BCC nose and  Melanoma back  . Cancer of skin Jan. 2014    [facility] Forehead [facility] Nose   Past Surgical History  Procedure Laterality Date  . Lumbar laminectomy     . Removal of melanoma from back  2007  . Knee arthroscopy    . Total knee anthroplasty    . Cardiac catheterization    . Vertebroplasty  07/11/2011    Procedure: VERTEBROPLASTY;  Surgeon: Reinaldo Meeker, MD;  Location: [facility];  Service: Neurosurgery;  Laterality: N/A;  Vertebroplasty of Lumbar Two  . Spine surgery  2013    Broken back: fusion surgery  . Pr vein bypass graft,aorto-fem-pop  2010  . Abdominal hysterectomy  1977    partial  . Back surgery  1982  . Tonisllectomy  1944  . Colonoscopy N/A 01/27/2013    Procedure: COLONOSCOPY;  Surgeon: Malissa Hippo, MD;  Location: [facility];  Service: Endoscopy;  Laterality: N/A;  1030  . Joint replacement  2001 and  2011    6 knee surgeries   bil replacement   Family History  Problem Relation Age of Onset  . Heart disease Father   . Hypertension Father   . Heart attack Father   . Cancer Mother     bladder   . Colon cancer Neg Hx   . Hypertension Daughter   . Hypertension Son    History  Substance Use Topics  . Smoking status: Never Smoker   . Smokeless tobacco: Never Used     Comment: tobacco use - no  .  Alcohol Use: Yes     Comment: occasionally a glass of wine or margarita per pt   OB History   Grav Para Term Preterm Abortions TAB SAB Ect Mult Living                 Review of Systems  Constitutional: Negative for activity change.       All ROS Neg except as noted in HPI  HENT: Negative for nosebleeds.   Eyes: Negative for photophobia and discharge.  Respiratory: Negative for cough, shortness of breath and wheezing.   Cardiovascular: Positive for chest pain. Negative for palpitations.  Gastrointestinal: Negative for abdominal pain and blood in stool.  Genitourinary: Negative for dysuria, frequency and hematuria.  Musculoskeletal: Positive for arthralgias and back pain. Negative for neck pain.  Skin: Negative.   Neurological: Negative for dizziness, seizures and speech difficulty.   Psychiatric/Behavioral: Negative for hallucinations and confusion.    Allergies  Epinephrine and Clopidogrel bisulfate  Home Medications   Current Outpatient Rx  Name  Route  Sig  Dispense  Refill  . alendronate (FOSAMAX) 70 MG tablet   Oral   Take 70 mg by mouth every 7 (seven) days. Wednesday; Take with a full glass of water on an empty stomach.         Marland Kitchen atorvastatin (LIPITOR) 80 MG tablet   Oral   Take 80 mg by mouth daily.         Marland Kitchen atorvastatin (LIPITOR) 80 MG tablet      TAKE ONE (1) TABLET AT BEDTIME   30 tablet   3   . Calcium Carbonate-Vitamin D (CALCIUM-VITAMIN D) 600-200 MG-UNIT CAPS   Oral   Take 1 capsule by mouth daily.          . Cholecalciferol (VITAMIN D) 2000 UNITS tablet   Oral   Take 2,000 Units by mouth daily.          . diphenoxylate-atropine (LOMOTIL) 2.5-0.025 MG per tablet   Oral   Take 1 tablet by mouth 4 (four) times daily as needed for diarrhea or loose stools.         . dipyridamole-aspirin (AGGRENOX) 200-25 MG per 12 hr capsule   Oral   Take 1 capsule by mouth daily.         Marland Kitchen estrogens, conjugated, (PREMARIN) 0.3 MG tablet   Oral   Take 0.3 mg by mouth daily. Take daily for 21 days then do not take for 7 days.         . fluticasone (FLONASE) 50 MCG/ACT nasal spray   Nasal   Place 2 sprays into the nose daily as needed for rhinitis.         Marland Kitchen glucosamine-chondroitin 500-400 MG tablet   Oral   Take 1 tablet by mouth daily.         Marland Kitchen lisinopril (PRINIVIL,ZESTRIL) 10 MG tablet   Oral   Take 10 mg by mouth daily.         Marland Kitchen loperamide (IMODIUM A-D) 2 MG tablet   Oral   Take 2 mg by mouth 4 (four) times daily as needed for diarrhea or loose stools.         . Magnesium 250 MG TABS   Oral   Take 250 mg by mouth daily.          . metoprolol tartrate (LOPRESSOR) 25 MG tablet   Oral   Take 25 mg by mouth 2 (two) times daily.         Marland Kitchen  nitroGLYCERIN (NITROSTAT) 0.4 MG SL tablet   Sublingual   Place  0.4 mg under the tongue every 5 (five) minutes as needed for chest pain.         . pantoprazole (PROTONIX) 40 MG tablet   Oral   Take 40 mg by mouth daily.          Marland Kitchen Specialty Vitamins Products (VITAMINS FOR THE HAIR PO)   Oral   Take 3,000 mcg by mouth.         . Zinc 50 MG CAPS   Oral   Take 50 mg by mouth daily.          BP 190/62  Pulse 69  Temp(Src) 98 F (36.7 C)  Resp 18  Ht 5\' 2"  (1.575 m)  Wt 150 lb (68.04 kg)  BMI 27.43 kg/m2  SpO2 98% Physical Exam  Nursing note and vitals reviewed. Constitutional: She is oriented to person, place, and time. She appears well-developed and well-nourished.  Non-toxic appearance.  HENT:  Head: Normocephalic.    Right Ear: Tympanic membrane and external ear normal.  Left Ear: Tympanic membrane and external ear normal.  Eyes: EOM and lids are normal. Pupils are equal, round, and reactive to light.  Neck: Normal range of motion. Neck supple. Carotid bruit is not present.  Cardiovascular: Normal rate, regular rhythm, normal heart sounds, intact distal pulses and normal pulses.   Pulmonary/Chest: Breath sounds normal. No respiratory distress.  No chest or rib area tenderness. Symmetrical rise and fall of the chest.  Abdominal: Soft. Bowel sounds are normal. There is no tenderness. There is no guarding.  Musculoskeletal: Normal range of motion.       Back:  Is no palpable step off of the cervical, thoracic, or lumbar area. There is no paraspinal area tenderness of the thoracic, lumbar, or cervical spine area.  Shallow abrasion of the left knee. Mild to mod crepitus of the knee. No dislocation. No effusion. Distal pulse of the right and left 2 plus.  Lymphadenopathy:       Head (right side): No submandibular adenopathy present.       Head (left side): No submandibular adenopathy present.    She has no cervical adenopathy.  Neurological: She is alert and oriented to person, place, and time. She has normal strength. No cranial  nerve deficit or sensory deficit.  Patient is awake and alert. Coordination is intact. No gross neurologic deficits appreciated at this time.  Skin: Skin is warm and dry.  Psychiatric: She has a normal mood and affect. Her speech is normal.    ED Course  LACERATION REPAIR Date/Time: 03/08/2013 7:28 PM Performed by: Kathie Dike Authorized by: Kathie Dike Consent: Verbal consent obtained. Risks and benefits: risks, benefits and alternatives were discussed Consent given by: patient Patient understanding: patient states understanding of the procedure being performed Body area: head/neck Location details: scalp Laceration length: 0.3 cm Foreign bodies: no foreign bodies Tendon involvement: none Nerve involvement: none Vascular damage: no Anesthesia: local infiltration Local anesthetic: lidocaine 1% without epinephrine Patient sedated: no Preparation: Patient was prepped and draped in the usual sterile fashion. Irrigation solution: saline Amount of cleaning: standard Skin closure: staples Number of sutures: 2 Approximation: close Approximation difficulty: simple Patient tolerance: Patient tolerated the procedure well with no immediate complications.   (including critical care time) Labs Review Labs Reviewed  PROTIME-INR   Imaging Review No results found.  EKG Interpretation   None       MDM  No diagnosis found. *I have reviewed nursing notes, vital signs, and all appropriate lab and imaging results for this patient.**  Patient is talkative, and in no distress whatsoever. The agent was fitted with a cervical collar after arriving in the emergency department.  Laceration of the occipital area was repaired with 2 staples. Patient tolerated the procedure without problem.  CT scan of the head reveals possible ischemic microvascular white matter disease. There is a left posterior scalp hematoma present. There is no intracranial hemorrhage or mass effect  appreciated in no acute CVA.  CT scan of the cervical spine reveals cervical spondylosis and degenerative disc disease with degenerative changes throughout the cervical spine area. No fracture or subluxation appreciated. Tetanus updated.  I ambulated the patient in the room and in [place] without major problem or complication. Patient requests to have Tylenol only for soreness. Feel that it is safe for the patient to be discharged home. I have discussed with the patient the need to be observant of any changes or problems and she is on Aggrenox. Patient states that she understands the instructions and agrees to return if any problems.    Kathie Dike, PA-C 03/08/13 1927  Kathie Dike, PA-C 03/08/13 2055

## 2013-03-08 NOTE — ED Provider Notes (Signed)
Medical screening examination/treatment/procedure(s) were conducted as a shared visit with non-physician practitioner(s) and myself.  I personally evaluated the patient during the encounter.  EKG Interpretation   None       Well appearing. Ct negative. Laceration repaired. Mechanical fall  Lyanne Co, MD 03/08/13 2056

## 2013-03-08 NOTE — ED Notes (Signed)
Lac to back of scalp, fell on wet floor in vets office.  No LOC, alert, talking, MAE No active bleeding.  Discomfort lt  Elbow when touches it .

## 2013-03-08 NOTE — ED Notes (Signed)
Pt slipped at Vet's office and hit her head. Small laceration to back of head. Pt is on blood thinner. Denies loc.

## 2013-03-09 DIAGNOSIS — N3941 Urge incontinence: Secondary | ICD-10-CM | POA: Diagnosis not present

## 2013-03-09 DIAGNOSIS — R350 Frequency of micturition: Secondary | ICD-10-CM | POA: Diagnosis not present

## 2013-03-15 ENCOUNTER — Encounter: Payer: Self-pay | Admitting: Family Medicine

## 2013-03-15 ENCOUNTER — Ambulatory Visit (INDEPENDENT_AMBULATORY_CARE_PROVIDER_SITE_OTHER): Payer: Medicare Other | Admitting: Family Medicine

## 2013-03-15 VITALS — BP 120/68 | Ht 63.0 in | Wt 152.0 lb

## 2013-03-15 DIAGNOSIS — R51 Headache: Secondary | ICD-10-CM | POA: Diagnosis not present

## 2013-03-15 NOTE — Progress Notes (Signed)
  Subjective:    Patient ID: Brenda Hall, female    DOB: 08/31/1930, 79 y.o.   MRN: [id number]  HPIFell 1 week ago. Slipped on wet floor at the vet's office.W[facility] to [facility] ED.  Having neck pain and needs staples removed from head. Neck pain is symmetric bilateral posterior worse when turning neck. Did have a CT scan. This is reviewed and negative.  Fell neg ct and cerv scan, went to the chiropractor, still noting some posterior headache. Sharp in nature. Worse with certain motions. Definitely improved. No nausea. No dizziness. No loss of consciousness.  Still having pain but better.    Review of Systems No chest pain no loss of consciousness no nausea no back pain ROS otherwise negative    Objective:   Physical Exam  Alert pleasant no acute distress. Lungs clear. Heart regular in rhythm. Vital stable per. H&T neck supple. Slight posterior lateral tenderness. Staples present removed without difficulty      Assessment & Plan:  Impression persistent headache neck pain post fall. Plan expect gradual resolution. Symptomatic care discussed. No need for further imaging at this time. Rationale discussed. WSL

## 2013-03-24 ENCOUNTER — Ambulatory Visit
Admission: RE | Admit: 2013-03-24 | Discharge: 2013-03-24 | Disposition: A | Discharge status: Home / Self Care | Payer: Medicare Other | Source: Ambulatory Visit

## 2013-03-24 DIAGNOSIS — Z1231 Encounter for screening mammogram for malignant neoplasm of breast: Secondary | ICD-10-CM

## 2013-03-30 ENCOUNTER — Other Ambulatory Visit: Payer: Self-pay | Admitting: *Deleted

## 2013-04-06 DIAGNOSIS — R350 Frequency of micturition: Secondary | ICD-10-CM | POA: Diagnosis not present

## 2013-04-06 DIAGNOSIS — N3941 Urge incontinence: Secondary | ICD-10-CM | POA: Diagnosis not present

## 2013-05-10 ENCOUNTER — Ambulatory Visit (INDEPENDENT_AMBULATORY_CARE_PROVIDER_SITE_OTHER): Payer: Medicare Other | Admitting: Internal Medicine

## 2013-05-11 ENCOUNTER — Other Ambulatory Visit: Payer: Self-pay | Admitting: Family Medicine

## 2013-05-11 DIAGNOSIS — M353 Polymyalgia rheumatica: Secondary | ICD-10-CM | POA: Diagnosis not present

## 2013-05-11 DIAGNOSIS — M5137 Other intervertebral disc degeneration, lumbosacral region: Secondary | ICD-10-CM | POA: Diagnosis not present

## 2013-05-11 DIAGNOSIS — M19049 Primary osteoarthritis, unspecified hand: Secondary | ICD-10-CM | POA: Diagnosis not present

## 2013-05-11 DIAGNOSIS — M25549 Pain in joints of unspecified hand: Secondary | ICD-10-CM | POA: Diagnosis not present

## 2013-05-13 DIAGNOSIS — R5383 Other fatigue: Secondary | ICD-10-CM | POA: Diagnosis not present

## 2013-05-13 DIAGNOSIS — Z79899 Other long term (current) drug therapy: Secondary | ICD-10-CM | POA: Diagnosis not present

## 2013-05-13 DIAGNOSIS — R5381 Other malaise: Secondary | ICD-10-CM | POA: Diagnosis not present

## 2013-05-13 DIAGNOSIS — E559 Vitamin D deficiency, unspecified: Secondary | ICD-10-CM | POA: Diagnosis not present

## 2013-05-14 DIAGNOSIS — R32 Unspecified urinary incontinence: Secondary | ICD-10-CM | POA: Diagnosis not present

## 2013-05-14 DIAGNOSIS — N318 Other neuromuscular dysfunction of bladder: Secondary | ICD-10-CM | POA: Diagnosis not present

## 2013-05-14 DIAGNOSIS — N951 Menopausal and female climacteric states: Secondary | ICD-10-CM | POA: Diagnosis not present

## 2013-05-14 DIAGNOSIS — N3941 Urge incontinence: Secondary | ICD-10-CM | POA: Diagnosis not present

## 2013-05-24 ENCOUNTER — Telehealth: Payer: Self-pay

## 2013-05-24 MED ORDER — METOPROLOL TARTRATE 25 MG PO TABS: 25.0000 mg | ORAL_TABLET | Freq: Two times a day (BID) | ORAL | Status: DC

## 2013-05-24 NOTE — Telephone Encounter (Signed)
Medication sent via escribe.  

## 2013-05-24 NOTE — Telephone Encounter (Signed)
Received fax refill request  Rx # [id number] Medication:  Metoprolol Tartrate 25 mg tab  Qty 60 Sig:  Take one tablet by mouth twice a day Physician:  Previous Wall/Recall with Domenic Polite.

## 2013-05-29 ENCOUNTER — Other Ambulatory Visit: Payer: Self-pay | Admitting: Family Medicine

## 2013-06-01 ENCOUNTER — Encounter (INDEPENDENT_AMBULATORY_CARE_PROVIDER_SITE_OTHER): Payer: Self-pay | Admitting: Internal Medicine

## 2013-06-01 ENCOUNTER — Ambulatory Visit (INDEPENDENT_AMBULATORY_CARE_PROVIDER_SITE_OTHER): Payer: Medicare Other | Admitting: Internal Medicine

## 2013-06-01 VITALS — BP 128/76 | HR 80 | Temp 97.5°F | Resp 18 | Ht 62.0 in | Wt 155.3 lb

## 2013-06-01 DIAGNOSIS — K589 Irritable bowel syndrome without diarrhea: Secondary | ICD-10-CM

## 2013-06-01 MED ORDER — DIPHENOXYLATE-ATROPINE 2.5-0.025 MG PO TABS: 1.0000 | ORAL_TABLET | Freq: Two times a day (BID) | ORAL | Status: DC | PRN

## 2013-06-01 NOTE — Progress Notes (Signed)
Presenting complaint;  Follow for diarrhea/IBS.  Subjective:  Patient is 80 year old Caucasian female who is here for scheduled visit. She underwent colonoscopy for screening purposes in October 2014 with removal of small tubular adenoma from cecum and random biopsy from sigmoid colon was negative for microscopic or collagenous colitis. She states ever since colonoscopy she has had very few episodes of diarrhea and urgency. She has not used Lomotil and/or Imodium off and since then. On most days she has one formed stool. 1 diarrhea days she may have as many as 8 stools per day and that is when she takes 1 or 2 doses of Imodium and/or Lomotil. She states she feels much better since she went on a Nutrisystem change her diet and lost 25 pounds. She states pantoprazole as working well.     Current Medications: Current Outpatient Prescriptions  Medication Sig Dispense Refill  . alendronate (FOSAMAX) 70 MG tablet Take 70 mg by mouth every 7 (seven) days. Wednesday; Take with a full glass of water on an empty stomach.      Marland Kitchen atorvastatin (LIPITOR) 80 MG tablet TAKE ONE (1) TABLET AT BEDTIME  30 tablet  0  . Calcium Carbonate-Vitamin D (CALCIUM-VITAMIN D) 600-200 MG-UNIT CAPS Take 1 capsule by mouth daily.       . Cholecalciferol (VITAMIN D) 2000 UNITS tablet Take 2,000 Units by mouth daily.       . diphenoxylate-atropine (LOMOTIL) 2.5-0.025 MG per tablet Take 1 tablet by mouth 4 (four) times daily as needed for diarrhea or loose stools.      . dipyridamole-aspirin (AGGRENOX) 200-25 MG per 12 hr capsule Take 1 capsule by mouth 2 (two) times daily.       Marland Kitchen estrogens, conjugated, (PREMARIN) 0.3 MG tablet Take 0.3 mg by mouth as needed. Patient states that she takes 1 by mouth every other night..      . fluticasone (FLONASE) 50 MCG/ACT nasal spray USE TWO SPRAYS IN EACH NOSTRIL ONCE DAILY AS NEEDED  16 g  3  . glucosamine-chondroitin 500-400 MG tablet Take 1 tablet by mouth daily.      Marland Kitchen lisinopril  (PRINIVIL,ZESTRIL) 10 MG tablet Take 10 mg by mouth daily.      Marland Kitchen loperamide (IMODIUM A-D) 2 MG tablet Take 2 mg by mouth 4 (four) times daily as needed for diarrhea or loose stools.      . Magnesium 250 MG TABS Take 250 mg by mouth daily.       . metoprolol tartrate (LOPRESSOR) 25 MG tablet Take 1 tablet (25 mg total) by mouth 2 (two) times daily.  60 tablet  6  . nitroGLYCERIN (NITROSTAT) 0.4 MG SL tablet Place 0.4 mg under the tongue every 5 (five) minutes as needed for chest pain.      . pantoprazole (PROTONIX) 40 MG tablet Take 40 mg by mouth daily.       . Zinc 50 MG CAPS Take 50 mg by mouth daily.       No current facility-administered medications for this visit.     Objective: Blood pressure 128/76, pulse 80, temperature 97.5 F (36.4 C), temperature source Oral, resp. rate 18, height 5\' 2"  (1.575 m), weight 155 lb 4.8 oz (70.444 kg).  patient is alert and in no acute distress. Conjunctiva is pink. Sclera is nonicteric Oropharyngeal mucosa is normal. No neck masses or thyromegaly noted. Cardiac exam with regular rhythm normal S1 and S2. faint systolic ejection murmur noted at LLSB. Lungs are clear to auscultation. Abdomen abdomen  is symmetrical soft and nontender without organomegaly or masses. No LE edema or clubbing noted.  Labs data; CBC from 05/12/2012  WBC 5.8, H&H 12.1 and 36.8 and platelet count 369K. Electrolytes within normal limits. BUN 17 creatinine 1.12 Glucose 82. Bilirubin 0.4, AP 52, AST 17, ALT 13, albumin 3.8 Calcium 8.9. Vitamin D level 55 ng/mL.  Assessment:  #1. IBS. Symptom severity and frequency has decreased since colonoscopy which may be function of decreasing colonic flora or mere coincidence. #2. GERD. She is doing well with therapy.    Plan:  New prescription given for Lomotil 1 by mouth twice a day when necessary. Patient advised to use glycerin or Dulcolax suppository should she become constipated and should not use oral  laxatives. Office visit in one year.

## 2013-06-01 NOTE — Patient Instructions (Signed)
Please try to get copy of the recent blood work for review.

## 2013-06-16 ENCOUNTER — Telehealth: Payer: Self-pay | Admitting: Family Medicine

## 2013-06-16 NOTE — Telephone Encounter (Signed)
Ok, I'll call her later in wk, no need for nurses to call

## 2013-06-16 NOTE — Telephone Encounter (Signed)
Pt calling to say that her Husband Ival Bible passed away this afternoon   She just wanted you to know

## 2013-06-17 ENCOUNTER — Ambulatory Visit: Payer: Medicare Other | Admitting: Family Medicine

## 2013-06-21 ENCOUNTER — Ambulatory Visit (INDEPENDENT_AMBULATORY_CARE_PROVIDER_SITE_OTHER): Payer: Medicare Other | Admitting: Family Medicine

## 2013-06-21 ENCOUNTER — Encounter: Payer: Self-pay | Admitting: Family Medicine

## 2013-06-21 VITALS — BP 102/60 | Temp 98.5°F | Ht 63.0 in | Wt 154.2 lb

## 2013-06-21 DIAGNOSIS — E782 Mixed hyperlipidemia: Secondary | ICD-10-CM

## 2013-06-21 DIAGNOSIS — I10 Essential (primary) hypertension: Secondary | ICD-10-CM

## 2013-06-21 DIAGNOSIS — Z79899 Other long term (current) drug therapy: Secondary | ICD-10-CM | POA: Diagnosis not present

## 2013-06-21 DIAGNOSIS — E785 Hyperlipidemia, unspecified: Secondary | ICD-10-CM

## 2013-06-21 DIAGNOSIS — I6350 Cerebral infarction due to unspecified occlusion or stenosis of unspecified cerebral artery: Secondary | ICD-10-CM

## 2013-06-21 DIAGNOSIS — I639 Cerebral infarction, unspecified: Secondary | ICD-10-CM

## 2013-06-21 MED ORDER — AMOXICILLIN 500 MG PO CAPS: 500.0000 mg | ORAL_CAPSULE | Freq: Three times a day (TID) | ORAL | Status: DC

## 2013-06-21 NOTE — Progress Notes (Signed)
   Subjective:    Patient ID: Brenda Hall, female    DOB: 01-Apr-1931, 80 y.o.   MRN: [id number]  Cough This is a new problem. The current episode started in the past 7 days. The problem has been unchanged. The cough is productive of sputum. Associated symptoms include chills and a sore throat. Nothing aggravates the symptoms. She has tried OTC cough suppressant for the symptoms. The treatment provided no relief.   Patient states that she had bloodwork done recently by another doctor and was told that she is anemic. Would like to discuss what therapy she needs to do to help with this. Was told both by her rheumatologist and by her gastroenterologist that she does not need treatment for this. Review of record shows anemia stretching back at least 4 years. Also significant history of polymyalgia for rheumatic.  Patient has history of hypertension. Claims compliance with medication. No obvious side effects.  History of hyperlipidemia. Known coronary artery disease. Claims compliance with medication. No side effects. Trying to cut down fats in diet.  Sig coughing and drainage  Low gr fever Review of Systems  Constitutional: Positive for chills.  HENT: Positive for sore throat.   Respiratory: Positive for cough.    No chest pain no fatigue no back pain ROS otherwise negative    Objective:   Physical Exam  Alert mild malaise. HEENT frontal maxillary tenderness. Pharynx slight erythema neck supple. Lungs clear abdomen cough during exam heart regular in rhythm. Ankles without edema.      Assessment & Plan:  Impression 1 hypertension good control. #2 hyperlipidemia status uncertain. #3 rhinosinusitis discuss. #4 coronary artery disease. Plan appropriate what work. Antibiotics prescribed. Symptomatic care discussed. Diet exercise discussed. Also recommend no further workup for anemia at this time with mild anemia history. WSL

## 2013-06-24 DIAGNOSIS — Z79899 Other long term (current) drug therapy: Secondary | ICD-10-CM | POA: Diagnosis not present

## 2013-06-24 DIAGNOSIS — E782 Mixed hyperlipidemia: Secondary | ICD-10-CM | POA: Diagnosis not present

## 2013-06-25 LAB — HEPATIC FUNCTION PANEL
ALBUMIN: 3.7 g/dL (ref 3.5–5.2)
ALK PHOS: 58 U/L (ref 39–117)
ALT: 12 U/L (ref 0–35)
AST: 17 U/L (ref 0–37)
Bilirubin, Direct: 0.1 mg/dL (ref 0.0–0.3)
Indirect Bilirubin: 0.3 mg/dL (ref 0.2–1.2)
Total Bilirubin: 0.4 mg/dL (ref 0.2–1.2)
Total Protein: 7 g/dL (ref 6.0–8.3)

## 2013-06-25 LAB — LIPID PANEL
Cholesterol: 151 mg/dL (ref 0–200)
HDL: 59 mg/dL (ref >39)
LDL Cholesterol: 67 mg/dL (ref 0–99)
Total CHOL/HDL Ratio: 2.6 Ratio
Triglycerides: 126 mg/dL (ref <150)
VLDL: 25 mg/dL (ref 0–40)

## 2013-06-27 ENCOUNTER — Encounter: Payer: Self-pay | Admitting: Family Medicine

## 2013-06-29 ENCOUNTER — Telehealth: Payer: Self-pay | Admitting: Family Medicine

## 2013-06-29 MED ORDER — CEFDINIR 300 MG PO CAPS: 300.0000 mg | ORAL_CAPSULE | Freq: Two times a day (BID) | ORAL | Status: DC

## 2013-06-29 NOTE — Telephone Encounter (Signed)
omnicef 300 bid ten d 

## 2013-06-29 NOTE — Telephone Encounter (Signed)
Med sent to pharm. Pt notified.  

## 2013-06-29 NOTE — Telephone Encounter (Signed)
Pt is almost done with her Amoxicillin  And is still coughing with lots of mucous at this point Is now in her chest, no sore throat at this point. Do we need to call something else in?   [facility]

## 2013-07-03 ENCOUNTER — Other Ambulatory Visit: Payer: Self-pay | Admitting: Family Medicine

## 2013-07-17 ENCOUNTER — Other Ambulatory Visit: Payer: Self-pay | Admitting: Family Medicine

## 2013-08-03 DIAGNOSIS — L608 Other nail disorders: Secondary | ICD-10-CM | POA: Diagnosis not present

## 2013-08-03 DIAGNOSIS — D239 Other benign neoplasm of skin, unspecified: Secondary | ICD-10-CM | POA: Diagnosis not present

## 2013-08-03 DIAGNOSIS — L570 Actinic keratosis: Secondary | ICD-10-CM | POA: Diagnosis not present

## 2013-09-07 DIAGNOSIS — N3941 Urge incontinence: Secondary | ICD-10-CM | POA: Diagnosis not present

## 2013-09-07 DIAGNOSIS — N318 Other neuromuscular dysfunction of bladder: Secondary | ICD-10-CM | POA: Diagnosis not present

## 2013-09-07 DIAGNOSIS — N189 Chronic kidney disease, unspecified: Secondary | ICD-10-CM | POA: Diagnosis not present

## 2013-09-20 ENCOUNTER — Encounter: Payer: Self-pay | Admitting: Family Medicine

## 2013-09-20 ENCOUNTER — Ambulatory Visit (INDEPENDENT_AMBULATORY_CARE_PROVIDER_SITE_OTHER): Payer: Medicare Other | Admitting: Family Medicine

## 2013-09-20 VITALS — BP 126/86 | Ht 63.0 in | Wt 154.4 lb

## 2013-09-20 DIAGNOSIS — R7989 Other specified abnormal findings of blood chemistry: Secondary | ICD-10-CM

## 2013-09-20 DIAGNOSIS — I6350 Cerebral infarction due to unspecified occlusion or stenosis of unspecified cerebral artery: Secondary | ICD-10-CM

## 2013-09-20 DIAGNOSIS — R799 Abnormal finding of blood chemistry, unspecified: Secondary | ICD-10-CM | POA: Diagnosis not present

## 2013-09-20 DIAGNOSIS — I10 Essential (primary) hypertension: Secondary | ICD-10-CM

## 2013-09-20 NOTE — Progress Notes (Signed)
   Subjective:    Patient ID: Brenda Hall, female    DOB: 12/17/1930, 80 y.o.   MRN: [id number]  HPI  Patient arrives to discuss results of recent creatinine. Patient states that her urologist said her kidney function was up to 1.5 and she needed to see kidney doctor.  Patient's creatinine has been generally good in the past. Saw urologist. Creatinine was up. What he said worried her.  Review of Systems No headache no chest pain no back pain no change in urinary habits ROS otherwise negative    Objective:   Physical Exam  Vitals stable alert no acute distress. Lungs clear heart regular in rhythm. Neuro exam intact.      Assessment & Plan:  Impression mildly elevated creatinine-before adding yet another specialist in this patient's less, recommend repeating met 7. Rationale discussed. Plan as per orders. Patient is enormously talkative as baseline and 25 minutes spent in discussing game plan and possible interventions. WSL 

## 2013-09-24 ENCOUNTER — Other Ambulatory Visit: Payer: Self-pay | Admitting: Urology

## 2013-09-24 DIAGNOSIS — M5030 Other cervical disc degeneration, unspecified cervical region: Secondary | ICD-10-CM | POA: Diagnosis not present

## 2013-09-24 DIAGNOSIS — M9981 Other biomechanical lesions of cervical region: Secondary | ICD-10-CM | POA: Diagnosis not present

## 2013-09-25 DIAGNOSIS — M9981 Other biomechanical lesions of cervical region: Secondary | ICD-10-CM | POA: Diagnosis not present

## 2013-09-25 DIAGNOSIS — M5030 Other cervical disc degeneration, unspecified cervical region: Secondary | ICD-10-CM | POA: Diagnosis not present

## 2013-09-27 DIAGNOSIS — M9981 Other biomechanical lesions of cervical region: Secondary | ICD-10-CM | POA: Diagnosis not present

## 2013-09-27 DIAGNOSIS — M5030 Other cervical disc degeneration, unspecified cervical region: Secondary | ICD-10-CM | POA: Diagnosis not present

## 2013-09-30 DIAGNOSIS — R799 Abnormal finding of blood chemistry, unspecified: Secondary | ICD-10-CM | POA: Diagnosis not present

## 2013-10-01 LAB — BASIC METABOLIC PANEL
BUN: 22 mg/dL (ref 6–23)
CHLORIDE: 107 meq/L (ref 96–112)
CO2: 27 meq/L (ref 19–32)
CREATININE: 1.15 mg/dL — AB (ref 0.50–1.10)
Calcium: 8.9 mg/dL (ref 8.4–10.5)
GLUCOSE: 84 mg/dL (ref 70–99)
POTASSIUM: 4.5 meq/L (ref 3.5–5.3)
Sodium: 141 mEq/L (ref 135–145)

## 2013-10-01 LAB — MICROALBUMIN / CREATININE URINE RATIO
CREATININE, URINE: 118.2 mg/dL
Microalb Creat Ratio: 4.2 mg/g (ref 0.0–30.0)
Microalb, Ur: 0.5 mg/dL (ref 0.00–1.89)

## 2013-10-29 ENCOUNTER — Other Ambulatory Visit: Payer: Self-pay | Admitting: *Deleted

## 2013-10-29 ENCOUNTER — Telehealth: Payer: Self-pay | Admitting: Family Medicine

## 2013-10-29 MED ORDER — METAXALONE 800 MG PO TABS
800.0000 mg | ORAL_TABLET | Freq: Three times a day (TID) | ORAL | Status: DC
Start: 2013-10-29 — End: 2014-02-02

## 2013-10-29 NOTE — Telephone Encounter (Signed)
Pt in [place] and just flew in yesterday, she has been experiencing leg pain/cramp From inner thigh down to feet. Last for about an hour eases then comes back.  Is going to try mustard to help with this.   She wants to know what she can do? She is in a great deal of pain at this time  Will be leaving tomorrow for a cruise if we could give her advice before then  no fever to the legs, [place] has gone away Feels well in all other sense

## 2013-10-29 NOTE — Telephone Encounter (Signed)
Discussed with patient. Pt advised to go directly to ed because of chance of blood clot. Pt then states she is not having any swelling or pain. Just muscle cramps in both legs. Consult with Dr. Nicki Reaper. Skelaxin one tid #30 caution drowsiness. Pt advised to get up and walk every 60 mins while traveling and to go directly to ed if she starts to have leg swelling or leg pain. Med called into [facility] [phone].

## 2013-10-30 DIAGNOSIS — N289 Disorder of kidney and ureter, unspecified: Secondary | ICD-10-CM | POA: Diagnosis not present

## 2013-10-30 DIAGNOSIS — R791 Abnormal coagulation profile: Secondary | ICD-10-CM | POA: Diagnosis not present

## 2013-10-30 DIAGNOSIS — R252 Cramp and spasm: Secondary | ICD-10-CM | POA: Diagnosis not present

## 2013-10-30 DIAGNOSIS — IMO0002 Reserved for concepts with insufficient information to code with codable children: Secondary | ICD-10-CM | POA: Diagnosis not present

## 2013-10-30 DIAGNOSIS — R109 Unspecified abdominal pain: Secondary | ICD-10-CM | POA: Diagnosis not present

## 2013-10-30 DIAGNOSIS — E860 Dehydration: Secondary | ICD-10-CM | POA: Diagnosis not present

## 2013-11-08 ENCOUNTER — Other Ambulatory Visit: Payer: Self-pay | Admitting: Family Medicine

## 2013-11-18 ENCOUNTER — Encounter (HOSPITAL_BASED_OUTPATIENT_CLINIC_OR_DEPARTMENT_OTHER): Admission: RE | Payer: Self-pay | Source: Ambulatory Visit

## 2013-11-18 ENCOUNTER — Ambulatory Visit (HOSPITAL_BASED_OUTPATIENT_CLINIC_OR_DEPARTMENT_OTHER): Admission: RE | Admit: 2013-11-18 | Payer: Medicare Other | Source: Ambulatory Visit | Admitting: Urology

## 2013-11-18 SURGERY — CYSTOSCOPY, WITH INJECTION OF BLADDER NECK OR BLADDER WALL
Anesthesia: General

## 2013-11-19 ENCOUNTER — Other Ambulatory Visit: Payer: Self-pay | Admitting: Family Medicine

## 2013-12-09 ENCOUNTER — Encounter: Payer: Self-pay | Admitting: Family Medicine

## 2013-12-09 ENCOUNTER — Ambulatory Visit (INDEPENDENT_AMBULATORY_CARE_PROVIDER_SITE_OTHER): Payer: Medicare Other | Admitting: Family Medicine

## 2013-12-09 VITALS — BP 130/70 | Ht 63.0 in | Wt 156.0 lb

## 2013-12-09 DIAGNOSIS — I6350 Cerebral infarction due to unspecified occlusion or stenosis of unspecified cerebral artery: Secondary | ICD-10-CM | POA: Diagnosis not present

## 2013-12-09 DIAGNOSIS — K589 Irritable bowel syndrome without diarrhea: Secondary | ICD-10-CM

## 2013-12-09 DIAGNOSIS — M353 Polymyalgia rheumatica: Secondary | ICD-10-CM | POA: Diagnosis not present

## 2013-12-09 DIAGNOSIS — I10 Essential (primary) hypertension: Secondary | ICD-10-CM

## 2013-12-09 DIAGNOSIS — I639 Cerebral infarction, unspecified: Secondary | ICD-10-CM

## 2013-12-09 DIAGNOSIS — I2510 Atherosclerotic heart disease of native coronary artery without angina pectoris: Secondary | ICD-10-CM

## 2013-12-09 DIAGNOSIS — Z79899 Other long term (current) drug therapy: Secondary | ICD-10-CM | POA: Diagnosis not present

## 2013-12-09 NOTE — Progress Notes (Signed)
   Subjective:    Patient ID: Brenda Hall, female    DOB: December 04, 1930, 80 y.o.   MRN: [id number]  Hypertension This is a chronic problem. The current episode started more than 1 year ago. The problem has been gradually improving since onset. The problem is controlled. There are no associated agents to hypertension. There are no known risk factors for coronary artery disease. Treatments tried: lisinopril, metoprolol. The current treatment provides significant improvement. There are no compliance problems.   Patient was recently seen at a ER in [place], [place] for leg cramps. Patient has a copy of the [place] report with her today. Ultrasound was negative thankfully.  skelaxone takes regularly but just as needed for cramps.  Severe pain, had to go the emergency room, had a neg ulstra sound/ took mustard  Compliant with bp meds and lipid meds. No obvious side effects with lipid medications. Try to watch her diet. Compliant with meds  Still some sweets at times  No assoc ch pain  Lots of fresh veggies and has cut down on meats   Exercising refularly, also watching her diet.  Review of Systems No chest pain no headache no back pain no shortness breath no abdominal pain no change in bowel habits no blood in stool ROS otherwise negative    Objective:   Physical Exam   Alert no apparent distress. Lungs clear. Heart rare in rhythm. HEENT normal. Ankles without edema. Blood pressure good on repeat. Pulses strength sensation intact all good and he     Assessment & Plan:  Pressure 1 hypertension good control. #2 hyperlipidemia status uncertain await results. #3 coronary artery disease clinically stable #4 leg cramps discussed plan appropriate blood work. Further recommendations based on results. Maintain same meds. Diet exercise does recheck in 6 months. WSL

## 2013-12-13 ENCOUNTER — Other Ambulatory Visit: Payer: Self-pay | Admitting: Cardiology

## 2013-12-14 DIAGNOSIS — I10 Essential (primary) hypertension: Secondary | ICD-10-CM | POA: Diagnosis not present

## 2013-12-14 DIAGNOSIS — Z79899 Other long term (current) drug therapy: Secondary | ICD-10-CM | POA: Diagnosis not present

## 2013-12-15 LAB — LIPID PANEL
Cholesterol: 158 mg/dL (ref 0–200)
HDL: 69 mg/dL (ref >39)
LDL Cholesterol: 69 mg/dL (ref 0–99)
TRIGLYCERIDES: 99 mg/dL (ref <150)
Total CHOL/HDL Ratio: 2.3 Ratio
VLDL: 20 mg/dL (ref 0–40)

## 2013-12-15 LAB — BASIC METABOLIC PANEL
BUN: 21 mg/dL (ref 6–23)
CO2: 29 mEq/L (ref 19–32)
Calcium: 9.2 mg/dL (ref 8.4–10.5)
Chloride: 104 mEq/L (ref 96–112)
Creat: 1.09 mg/dL (ref 0.50–1.10)
Glucose, Bld: 96 mg/dL (ref 70–99)
POTASSIUM: 4.9 meq/L (ref 3.5–5.3)
SODIUM: 141 meq/L (ref 135–145)

## 2013-12-15 LAB — HEPATIC FUNCTION PANEL
ALT: 9 U/L (ref 0–35)
AST: 14 U/L (ref 0–37)
Albumin: 3.7 g/dL (ref 3.5–5.2)
Alkaline Phosphatase: 53 U/L (ref 39–117)
Bilirubin, Direct: 0.1 mg/dL (ref 0.0–0.3)
Indirect Bilirubin: 0.3 mg/dL (ref 0.2–1.2)
TOTAL PROTEIN: 6.5 g/dL (ref 6.0–8.3)
Total Bilirubin: 0.4 mg/dL (ref 0.2–1.2)

## 2013-12-16 ENCOUNTER — Encounter: Payer: Self-pay | Admitting: Family Medicine

## 2014-01-05 ENCOUNTER — Other Ambulatory Visit: Payer: Self-pay | Admitting: Cardiology

## 2014-01-05 ENCOUNTER — Other Ambulatory Visit: Payer: Self-pay | Admitting: Family Medicine

## 2014-01-11 ENCOUNTER — Other Ambulatory Visit: Payer: Self-pay | Admitting: Adult Health

## 2014-01-17 ENCOUNTER — Encounter: Payer: Self-pay | Admitting: Cardiology

## 2014-01-17 ENCOUNTER — Ambulatory Visit (INDEPENDENT_AMBULATORY_CARE_PROVIDER_SITE_OTHER): Payer: Medicare Other | Admitting: Cardiology

## 2014-01-17 VITALS — BP 138/70 | HR 72 | Ht 63.0 in | Wt 158.0 lb

## 2014-01-17 DIAGNOSIS — E785 Hyperlipidemia, unspecified: Secondary | ICD-10-CM

## 2014-01-17 DIAGNOSIS — I2510 Atherosclerotic heart disease of native coronary artery without angina pectoris: Secondary | ICD-10-CM | POA: Diagnosis not present

## 2014-01-17 DIAGNOSIS — I10 Essential (primary) hypertension: Secondary | ICD-10-CM | POA: Diagnosis not present

## 2014-01-17 DIAGNOSIS — I6350 Cerebral infarction due to unspecified occlusion or stenosis of unspecified cerebral artery: Secondary | ICD-10-CM | POA: Diagnosis not present

## 2014-01-17 NOTE — Progress Notes (Signed)
Clinical Summary Brenda Hall is a 80 y.o.female former patient of Dr Verl Blalock, this is our first visit together. She is seen for the following medical problems.  1. CAD - prior NSTEMI 12/2005, she had a BMS placed to LCX. LVEF at that time 52% - echo 03/2008 LVEF normal, no LVEF given - denies any chest pain. Denies any SOB or DOE, does water aerobics three times a week without any symptoms - compliant with meds  2. Hx of TIA - on aggrenox and statin    3, Hyperlipidemia - compliant with atorvastatin,  - last panel 11/2013 TC 158, TG 99, HDL 69, LDL 69     Past Medical History  Diagnosis Date  . Coronary atherosclerosis of unspecified type of vessel, native or graft   . Other diseases of lung, not elsewhere classified     lung nodule  . DJD (degenerative joint disease) of knee   . IBS (irritable bowel syndrome)   . Polymyalgia rheumatica   . HTN (hypertension)   . Chest pain, unspecified   . History of TIA (transient ischemic attack)   . Myocardial infarction     5 yrs ago     dr wall  cardiac  . Anemia   . CHF (congestive heart failure)   . Stroke   . Osteoporosis   . Carotid stenosis     mild  . Chronic back pain   . Cancer 1988 and  2009    BCC nose and  Melanoma back  . Cancer of skin Jan. 2014    [facility] Forehead and Nose     Allergies  Allergen Reactions  . Epinephrine Other (See Comments)    During dental procedure-caused facial swelling at injection site. Pt has had epinephrine since then for nose and facial surgery and did fine with it per pt.  . Clopidogrel Bisulfate Itching and Rash    Plavix     Current Outpatient Prescriptions  Medication Sig Dispense Refill  . AGGRENOX 25-200 MG per 12 hr capsule TAKE 1 CAPSULE TWICE A DAY  3 capsule  3  . alendronate (FOSAMAX) 70 MG tablet Take 70 mg by mouth every 7 (seven) days. Wednesday; Take with a full glass of water on an empty stomach.      Marland Kitchen atorvastatin (LIPITOR) 80 MG tablet TAKE ONE (1) TABLET  AT BEDTIME  30 tablet  5  . Calcium Carbonate-Vitamin D (CALCIUM-VITAMIN D) 600-200 MG-UNIT CAPS Take 1 capsule by mouth daily.       . Cholecalciferol (VITAMIN D) 2000 UNITS tablet Take 2,000 Units by mouth daily.       . diphenoxylate-atropine (LOMOTIL) 2.5-0.025 MG per tablet Take 1 tablet by mouth 2 (two) times daily as needed for diarrhea or loose stools.  60 tablet  0  . fluticasone (FLONASE) 50 MCG/ACT nasal spray USE TWO SPRAYS IN EACH NOSTRIL ONCE DAILY AS NEEDED  16 g  3  . glucosamine-chondroitin 500-400 MG tablet Take 1 tablet by mouth daily.      Marland Kitchen lisinopril (PRINIVIL,ZESTRIL) 10 MG tablet Take 10 mg by mouth daily.      Marland Kitchen lisinopril (PRINIVIL,ZESTRIL) 10 MG tablet TAKE ONE (1) TABLET BY MOUTH EVERY DAY  30 tablet  0  . loperamide (IMODIUM A-D) 2 MG tablet Take 2 mg by mouth 4 (four) times daily as needed for diarrhea or loose stools.      . Magnesium 250 MG TABS Take 250 mg by mouth daily.       Marland Kitchen  metaxalone (SKELAXIN) 800 MG tablet Take 1 tablet (800 mg total) by mouth 3 (three) times daily.  30 tablet  0  . metoprolol tartrate (LOPRESSOR) 25 MG tablet TAKE ONE TABLET TWICE DAILY  60 tablet  0  . nitroGLYCERIN (NITROSTAT) 0.4 MG SL tablet Place 0.4 mg under the tongue every 5 (five) minutes as needed for chest pain.      . pantoprazole (PROTONIX) 40 MG tablet Take 40 mg by mouth daily.       Marland Kitchen PREMARIN 0.3 MG tablet TAKE 1 TABLET DAILY  90 tablet  1  . Zinc 50 MG CAPS Take 50 mg by mouth daily.       No current facility-administered medications for this visit.     Past Surgical History  Procedure Laterality Date  . Lumbar laminectomy    . Removal of melanoma from back  2007  . Knee arthroscopy    . Total knee anthroplasty    . Cardiac catheterization    . Vertebroplasty  07/11/2011    Procedure: VERTEBROPLASTY;  Surgeon: Faythe Ghee, MD;  Location: [facility];  Service: Neurosurgery;  Laterality: N/A;  Vertebroplasty of Lumbar Two  . Spine surgery  2013    Broken  back: fusion surgery  . Pr vein bypass graft,aorto-fem-pop  2010  . Abdominal hysterectomy  1977    partial  . Back surgery  1982  . Tonisllectomy  1944  . Colonoscopy N/A 01/27/2013    Procedure: COLONOSCOPY;  Surgeon: Rogene Houston, MD;  Location: [facility];  Service: Endoscopy;  Laterality: N/A;  1030  . Joint replacement  2001 and  2011    6 knee surgeries   bil replacement     Allergies  Allergen Reactions  . Epinephrine Other (See Comments)    During dental procedure-caused facial swelling at injection site. Pt has had epinephrine since then for nose and facial surgery and did fine with it per pt.  . Clopidogrel Bisulfate Itching and Rash    Plavix      Family History  Problem Relation Age of Onset  . Heart disease Father   . Hypertension Father   . Heart attack Father   . Cancer Mother     bladder   . Colon cancer Neg Hx   . Hypertension Daughter   . Hypertension Son      Social History Brenda Hall reports that she has never smoked. She has never used smokeless tobacco. Ms. Freese reports that she drinks alcohol.   Review of Systems CONSTITUTIONAL: No weight loss, fever, chills, weakness or fatigue.  HEENT: Eyes: No visual loss, blurred vision, double vision or yellow sclerae.No hearing loss, sneezing, congestion, runny nose or sore throat.  SKIN: No rash or itching.  CARDIOVASCULAR: per HPI RESPIRATORY: No shortness of breath, cough or sputum.  GASTROINTESTINAL: No anorexia, nausea, vomiting or diarrhea. No abdominal pain or blood.  GENITOURINARY: No burning on urination, no polyuria NEUROLOGICAL: No headache, dizziness, syncope, paralysis, ataxia, numbness or tingling in the extremities. No change in bowel or bladder control.  MUSCULOSKELETAL: + back pain LYMPHATICS: No enlarged nodes. No history of splenectomy.  PSYCHIATRIC: No history of depression or anxiety.  ENDOCRINOLOGIC: No reports of sweating, cold or heat intolerance. No polyuria  or polydipsia.  Marland Kitchen   Physical Examination p 72 bp 138/70 Wt 158 lbs BMI 28 Gen: resting comfortably, no acute distress HEENT: no scleral icterus, pupils equal round and reactive, no palptable cervical adenopathy,  CV: RRR, no  m/r/g, no JVD, no carotid bruits Resp: Clear to auscultation bilaterally GI: abdomen is soft, non-tender, non-distended, normal bowel sounds, no hepatosplenomegaly MSK: extremities are warm, no edema.  Skin: warm, no rash Neuro:  no focal deficits Psych: appropriate affect   Diagnostic Studies 02/2013 Carotid US <40% bilateral ICA disease  03/2008 Echo SUMMARY - Overall left ventricular systolic function was normal. There were no left ventricular regional wall motion abnormalities. Left ventricular wall thickness was mildly increased. - There was trivial aortic valvular regurgitation. - The effective orifice of mitral regurgitation by proximal isovelocity surface area was 0.06 cm^2. The volume of mitral regurgitation by proximal isovelocity surface area was 9 cc. - The left atrium was mildly dilated. - The estimated peak right ventricular systolic pressure was within the upper limits of normal. - The right atrium was mildly dilated.    Assessment and Plan  1. CAD - no current symptoms, continue secondary prevention and risk factor modification - start ASA 81mg  daily, she is receiving only 50mg  daily form her aggrenox, recommended minimum to precent cardiac events is 81mg  daily  2. Hx of TIA - continue aggrenox and statin  3. Hyperlipidemia - at goal, continue high dose statin      Arnoldo Lenis, M.D

## 2014-01-17 NOTE — Patient Instructions (Signed)
Your physician wants you to follow-up in: 1 year with Dr. Bryna Colander will receive a reminder letter in the mail two months in advance. If you don't receive a letter, please call our office to schedule the follow-up appointment.  Your physician has recommended you make the following change in your medication:   START ASPIRIN 81 MG DAILY  Thank you for choosing [facility]!!

## 2014-01-28 DIAGNOSIS — Z23 Encounter for immunization: Secondary | ICD-10-CM | POA: Diagnosis not present

## 2014-02-01 ENCOUNTER — Telehealth: Payer: Self-pay | Admitting: *Deleted

## 2014-02-01 NOTE — Telephone Encounter (Signed)
rx request from [facility] [facility]. Metaxalone 800mg . Last seen 01/17/14

## 2014-02-01 NOTE — Telephone Encounter (Signed)
Ok plus 3 ref 

## 2014-02-02 ENCOUNTER — Other Ambulatory Visit: Payer: Self-pay | Admitting: *Deleted

## 2014-02-02 MED ORDER — METAXALONE 800 MG PO TABS: 800.0000 mg | ORAL_TABLET | Freq: Three times a day (TID) | ORAL | Status: DC

## 2014-02-02 NOTE — Telephone Encounter (Signed)
Med sent to pharm 

## 2014-02-04 ENCOUNTER — Other Ambulatory Visit: Payer: Self-pay | Admitting: Cardiovascular Disease

## 2014-02-16 ENCOUNTER — Other Ambulatory Visit: Payer: Self-pay

## 2014-02-16 DIAGNOSIS — Z1231 Encounter for screening mammogram for malignant neoplasm of breast: Secondary | ICD-10-CM

## 2014-02-17 ENCOUNTER — Telehealth: Payer: Self-pay | Admitting: Cardiovascular Disease

## 2014-02-17 MED ORDER — LISINOPRIL 10 MG PO TABS: 10.0000 mg | ORAL_TABLET | Freq: Every day | ORAL | Status: DC

## 2014-02-17 NOTE — Telephone Encounter (Signed)
Received fax refill request  Rx # [id number] Medication:  Lisinopril 10 mg tab Qty 30 Sig:  Take one tablet by mouth every day Physician:  Bronson Ing

## 2014-02-21 ENCOUNTER — Telehealth: Payer: Self-pay

## 2014-02-21 NOTE — Telephone Encounter (Signed)
Pt. called to report problems with left leg; related hx in July of this year, of having severe leg cramps in left leg, after flying several hours to [place].  Reported she was evaluated for DVT and this had been ruled out @ that time.  Reported she continues to have a tenderness in the (L) upper, inner thigh, and is requesting to have this evaluated while at appt. on 03/04/14.  Denied any swelling of left LE.  Denied any redness/ warmth at the area of tenderness.  Reported she is very active, and does water aerobics 3x/ week.  Advised that if she is having increased discomfort in left upper, inner thigh, to call her PCP, otherwise, will see her for the appt. 11/13, for her carotid surveillance, and she can discuss her leg symptoms with the nurse practitioner, at that time.  Verb. Understanding.

## 2014-02-22 DIAGNOSIS — H2513 Age-related nuclear cataract, bilateral: Secondary | ICD-10-CM | POA: Diagnosis not present

## 2014-03-03 ENCOUNTER — Other Ambulatory Visit: Payer: Self-pay | Admitting: Family Medicine

## 2014-03-04 ENCOUNTER — Ambulatory Visit: Payer: Medicare Other | Admitting: Family

## 2014-03-04 ENCOUNTER — Other Ambulatory Visit (HOSPITAL_COMMUNITY): Payer: Medicare Other

## 2014-03-22 ENCOUNTER — Other Ambulatory Visit: Payer: Self-pay | Admitting: Dermatology

## 2014-03-22 DIAGNOSIS — L570 Actinic keratosis: Secondary | ICD-10-CM | POA: Diagnosis not present

## 2014-03-22 DIAGNOSIS — D040 Carcinoma in situ of skin of lip: Secondary | ICD-10-CM | POA: Diagnosis not present

## 2014-03-22 DIAGNOSIS — I6789 Other cerebrovascular disease: Secondary | ICD-10-CM | POA: Diagnosis not present

## 2014-03-22 DIAGNOSIS — L820 Inflamed seborrheic keratosis: Secondary | ICD-10-CM | POA: Diagnosis not present

## 2014-03-22 DIAGNOSIS — D0430 Carcinoma in situ of skin of unspecified part of face: Secondary | ICD-10-CM | POA: Diagnosis not present

## 2014-03-28 ENCOUNTER — Ambulatory Visit
Admission: RE | Admit: 2014-03-28 | Discharge: 2014-03-28 | Disposition: A | Discharge status: Home / Self Care | Payer: Medicare Other | Source: Ambulatory Visit

## 2014-03-28 DIAGNOSIS — Z1231 Encounter for screening mammogram for malignant neoplasm of breast: Secondary | ICD-10-CM | POA: Diagnosis not present

## 2014-03-29 ENCOUNTER — Encounter: Payer: Self-pay | Admitting: Family

## 2014-03-30 ENCOUNTER — Encounter: Payer: Self-pay | Admitting: Family

## 2014-03-30 ENCOUNTER — Ambulatory Visit (HOSPITAL_COMMUNITY)
Admission: RE | Admit: 2014-03-30 | Discharge: 2014-03-30 | Disposition: A | Discharge status: Home / Self Care | Payer: Medicare Other | Source: Ambulatory Visit | Attending: Family | Admitting: Family

## 2014-03-30 ENCOUNTER — Encounter (INDEPENDENT_AMBULATORY_CARE_PROVIDER_SITE_OTHER): Payer: Self-pay | Admitting: *Deleted

## 2014-03-30 ENCOUNTER — Ambulatory Visit (INDEPENDENT_AMBULATORY_CARE_PROVIDER_SITE_OTHER): Payer: Medicare Other | Admitting: Family

## 2014-03-30 VITALS — BP 161/83 | HR 65 | Resp 14 | Ht 63.0 in | Wt 153.0 lb

## 2014-03-30 DIAGNOSIS — I6523 Occlusion and stenosis of bilateral carotid arteries: Secondary | ICD-10-CM

## 2014-03-30 DIAGNOSIS — I639 Cerebral infarction, unspecified: Secondary | ICD-10-CM

## 2014-03-30 NOTE — Progress Notes (Signed)
Established Carotid Patient   History of Present Illness  Brenda Hall is a 80 y.o. female  patient of Dr. Bridgett Larsson returns today for carotid artery stenosis follow up. She has no history of carotid artery intervention. She exercises in the water 3x/week and feels well. She does not walk for exercise due to back issues. Had a "minor stroke" about 2010 as manifested by right hand to arm weakness and pain that then radiated to right leg, her symptoms lasted less than 30 minutes, has no residual effects from stroke, no amaurosis fugax, no facial drooping no slurred speech or aphasia, no further stroke or TIA activity since 2010. Had MI about 2008, had cardiac stent placed.  Pt reports New Medical or Surgical History: basal cell CA removed from face and scalp with skin grafting. Also had kyphoplasty in 2013. While in [place] in July 2015 she developed severe night time thigh cramps, medial aspects, relieved by Skelaxin; states she was evaluated for blood clots in her legs while in [place], states none found.  Pt Diabetic: No Pt smoker: non-smoker  Pt meds include: Statin : Yes ASA: Yes and Aggrenox Other anticoagulants/antiplatelets: no: allergic to Plavix  Past Medical History  Diagnosis Date  . Coronary atherosclerosis of unspecified type of vessel, native or graft   . Other diseases of lung, not elsewhere classified     lung nodule  . DJD (degenerative joint disease) of knee   . IBS (irritable bowel syndrome)   . Polymyalgia rheumatica   . HTN (hypertension)   . Chest pain, unspecified   . History of TIA (transient ischemic attack)   . Myocardial infarction     5 yrs ago     dr wall  cardiac  . Anemia   . CHF (congestive heart failure)   . Stroke   . Osteoporosis   . Carotid stenosis     mild  . Chronic back pain   . Cancer 1988 and  2009    BCC nose and  Melanoma back  . Cancer of skin Jan. 2014    [facility] Forehead and Nose    Social History History  Substance Use  Topics  . Smoking status: Never Smoker   . Smokeless tobacco: Never Used     Comment: tobacco use - no  . Alcohol Use: Yes     Comment: occasionally a glass of wine or margarita per pt    Family History Family History  Problem Relation Age of Onset  . Heart disease Father   . Hypertension Father   . Heart attack Father   . Cancer Mother     bladder   . Colon cancer Neg Hx   . Hypertension Daughter   . Hypertension Son     Surgical History Past Surgical History  Procedure Laterality Date  . Lumbar laminectomy    . Removal of melanoma from back  2007  . Knee arthroscopy    . Total knee anthroplasty    . Cardiac catheterization    . Vertebroplasty  07/11/2011    Procedure: VERTEBROPLASTY;  Surgeon: Faythe Ghee, MD;  Location: [facility];  Service: Neurosurgery;  Laterality: N/A;  Vertebroplasty of Lumbar Two  . Spine surgery  2013    Broken back: fusion surgery  . Pr vein bypass graft,aorto-fem-pop  2010  . Abdominal hysterectomy  1977    partial  . Back surgery  1982  . Tonisllectomy  1944  . Colonoscopy N/A 01/27/2013    Procedure: COLONOSCOPY;  Surgeon: Rogene Houston, MD;  [facility];  Service: Endoscopy;  Laterality: N/A;  1030  . Joint replacement  2001 and  2011    6 knee surgeries   bil replacement    Allergies  Allergen Reactions  . Epinephrine Other (See Comments)    During dental procedure-caused facial swelling at injection site. Pt has had epinephrine since then for nose and facial surgery and did fine with it per pt.  . Clopidogrel Bisulfate Itching and Rash    Plavix    Current Outpatient Prescriptions  Medication Sig Dispense Refill  . AGGRENOX 25-200 MG per 12 hr capsule TAKE 1 CAPSULE TWICE A DAY 3 capsule 3  . alendronate (FOSAMAX) 70 MG tablet Take 70 mg by mouth every 7 (seven) days. Wednesday; Take with a full glass of water on an empty stomach.    Marland Kitchen aspirin 81 MG tablet Take 81 mg by mouth daily.    Marland Kitchen atorvastatin  (LIPITOR) 80 MG tablet TAKE ONE (1) TABLET AT BEDTIME 30 tablet 5  . Calcium Carbonate-Vitamin D (CALCIUM-VITAMIN D) 600-200 MG-UNIT CAPS Take 1 capsule by mouth daily.     . Cholecalciferol (VITAMIN D) 2000 UNITS tablet Take 2,000 Units by mouth daily.     . diphenoxylate-atropine (LOMOTIL) 2.5-0.025 MG per tablet Take 1 tablet by mouth 2 (two) times daily as needed for diarrhea or loose stools. 60 tablet 0  . fluticasone (FLONASE) 50 MCG/ACT nasal spray USE 2 SPRAYS IN EACH NOSTRIL ONCE DAILY 16 g 2  . glucosamine-chondroitin 500-400 MG tablet Take 1 tablet by mouth daily.    Marland Kitchen lisinopril (PRINIVIL,ZESTRIL) 10 MG tablet Take 1 tablet (10 mg total) by mouth daily. 90 tablet 3  . loperamide (IMODIUM) 2 MG capsule Take by mouth as needed for diarrhea or loose stools.    . Magnesium 250 MG TABS Take 250 mg by mouth daily.     . metaxalone (SKELAXIN) 800 MG tablet Take 1 tablet (800 mg total) by mouth 3 (three) times daily. 30 tablet 3  . metoprolol tartrate (LOPRESSOR) 25 MG tablet TAKE ONE TABLET TWICE DAILY 60 tablet 11  . nitroGLYCERIN (NITROSTAT) 0.4 MG SL tablet Place 0.4 mg under the tongue every 5 (five) minutes as needed for chest pain.    . pantoprazole (PROTONIX) 40 MG tablet Take 40 mg by mouth daily.     Marland Kitchen PREMARIN 0.3 MG tablet TAKE 1 TABLET DAILY 90 tablet 1  . Zinc 50 MG CAPS Take 50 mg by mouth daily.     No current facility-administered medications for this visit.    Review of Systems : See HPI for pertinent positives and negatives.  Physical Examination  Filed Vitals:   03/30/14 1404 03/30/14 1407  BP: 158/74 161/83  Pulse: 68 65  Resp: 14   Height: 5\' 3"  (1.6 m)   Weight: 153 lb (69.4 kg)    Body mass index is 27.11 kg/(m^2).  General: WDWN female in NAD GAIT: normal Eyes: PERRLA Pulmonary: CTAB, Negative Rales, Negative rhonchi, & Negative wheezing.  Cardiac: regular Rhythm, no detected murmur  VASCULAR EXAM Carotid Bruits Left Right   Negative  Negative  Radial pulses are 1+ palpable and equal. Aorta is not palpable      LE Pulses LEFT RIGHT       FEMORAL 2+ palpable 2+ palpable    POPLITEAL  not palpable  not palpable   POSTERIOR TIBIAL not palpable  not palpable    DORSALIS PEDIS  ANTERIOR  TIBIAL 2+palpable  1+palpable     Gastrointestinal: soft, nontender, BS WNL, no r/g, no palpable masses.  Musculoskeletal: Negative muscle atrophy/wasting. M/S 5/5 throughout, Extremities without ischemic changes.  Neurologic: A&O X 3; Appropriate Affect,  Speech is normal, loquacious,  CN 2-12 intact except some hearing loss, Pain and light touch intact in extremities, Motor exam as listed above.   Non-Invasive Vascular Imaging CAROTID DUPLEX 03/30/2014   CEREBROVASCULAR DUPLEX EVALUATION    INDICATION: Carotid artery disease     PREVIOUS INTERVENTION(S):     DUPLEX EXAM:     RIGHT  LEFT  Peak Systolic Velocities (cm/s) End Diastolic Velocities (cm/s) Plaque LOCATION Peak Systolic Velocities (cm/s) End Diastolic Velocities (cm/s) Plaque  92 17  CCA PROXIMAL 67 17   65 15  CCA MID 64 15   58 12 HT CCA DISTAL 67 18 HT  238 9 CP ECA 97 10 HT  97 20 HT ICA PROXIMAL 44 13 HT  83 25  ICA MID 70 20   75 22  ICA DISTAL 61 20     1.49 ICA / CCA Ratio (PSV) 1.09  Antegrade  Vertebral Flow Antegrade    Brachial Systolic Pressure (mmHg)   Multiphasic  Brachial Artery Waveforms Multiphasic     Plaque Morphology:  HM = Homogeneous, HT = Heterogeneous, CP = Calcific Plaque, SP = Smooth Plaque, IP = Irregular Plaque  ADDITIONAL FINDINGS:     IMPRESSION: Bilateral internal carotid artery velocities suggest a <40% stenosis.     Compared to the previous exam:  No significant change in comparison to the last exam on 03/03/2013.      Assessment: Maleiyah H  Zahradnik is a 80 y.o. female who presents with asymptomatic minimal bilateral internal carotid artery stenosis. No significant change in comparison to the last exam on 03/03/2013.   Plan: Follow-up in 1 year with Carotid Duplex.   I discussed in depth with the patient the nature of atherosclerosis, and emphasized the importance of maximal medical management including strict control of blood pressure, blood glucose, and lipid levels, obtaining regular exercise, and continued cessation of smoking.  The patient is aware that without maximal medical management the underlying atherosclerotic disease process will progress, limiting the benefit of any interventions. The patient was given information about stroke prevention and what symptoms should prompt the patient to seek immediate medical care. Thank you for allowing [place] to participate in this patient's care.  Clemon Chambers, RN, MSN, FNP-C [facility] Office: [phone]  Clinic Physician: Scot Dock  03/30/2014 2:03 PM

## 2014-03-30 NOTE — Patient Instructions (Signed)
Stroke Prevention Some medical conditions and behaviors are associated with an increased chance of having a stroke. You may prevent a stroke by making healthy choices and managing medical conditions. HOW CAN I REDUCE MY RISK OF HAVING A STROKE?   Stay physically active. Get at least 30 minutes of activity on most or all days.  Do not smoke. It may also be helpful to avoid exposure to secondhand smoke.  Limit alcohol use. Moderate alcohol use is considered to be:  No more than 2 drinks per day for men.  No more than 1 drink per day for nonpregnant women.  Eat healthy foods. This involves:  Eating 5 or more servings of fruits and vegetables a day.  Making dietary changes that address high blood pressure (hypertension), high cholesterol, diabetes, or obesity.  Manage your cholesterol levels.  Making food choices that are high in fiber and low in saturated fat, trans fat, and cholesterol may control cholesterol levels.  Take any prescribed medicines to control cholesterol as directed by your health care provider.  Manage your diabetes.  Controlling your carbohydrate and sugar intake is recommended to manage diabetes.  Take any prescribed medicines to control diabetes as directed by your health care provider.  Control your hypertension.  Making food choices that are low in salt (sodium), saturated fat, trans fat, and cholesterol is recommended to manage hypertension.  Take any prescribed medicines to control hypertension as directed by your health care provider.  Maintain a healthy weight.  Reducing calorie intake and making food choices that are low in sodium, saturated fat, trans fat, and cholesterol are recommended to manage weight.  Stop drug abuse.  Avoid taking birth control pills.  Talk to your health care provider about the risks of taking birth control pills if you are over 35 years old, smoke, get migraines, or have ever had a blood clot.  Get evaluated for sleep  disorders (sleep apnea).  Talk to your health care provider about getting a sleep evaluation if you snore a lot or have excessive sleepiness.  Take medicines only as directed by your health care provider.  For some people, aspirin or blood thinners (anticoagulants) are helpful in reducing the risk of forming abnormal blood clots that can lead to stroke. If you have the irregular heart rhythm of atrial fibrillation, you should be on a blood thinner unless there is a good reason you cannot take them.  Understand all your medicine instructions.  Make sure that other conditions (such as anemia or atherosclerosis) are addressed. SEEK IMMEDIATE MEDICAL CARE IF:   You have sudden weakness or numbness of the face, arm, or leg, especially on one side of the body.  Your face or eyelid droops to one side.  You have sudden confusion.  You have trouble speaking (aphasia) or understanding.  You have sudden trouble seeing in one or both eyes.  You have sudden trouble walking.  You have dizziness.  You have a loss of balance or coordination.  You have a sudden, severe headache with no known cause.  You have new chest pain or an irregular heartbeat. Any of these symptoms may represent a serious problem that is an emergency. Do not wait to see if the symptoms will go away. Get medical help at once. Call your local emergency services (911 in [place] Do not drive yourself to the hospital. Document Released: 05/16/2004 Document Revised: 08/23/2013 Document Reviewed: 10/09/2012 Exit[employer] Patient Information 2015 [employer]. This information is not intended to replace advice given   to you by your health care provider. Make sure you discuss any questions you have with your health care provider.  

## 2014-04-04 ENCOUNTER — Encounter: Payer: Self-pay | Admitting: Family Medicine

## 2014-04-04 ENCOUNTER — Ambulatory Visit (INDEPENDENT_AMBULATORY_CARE_PROVIDER_SITE_OTHER): Payer: Medicare Other | Admitting: Family Medicine

## 2014-04-04 VITALS — BP 118/80 | Temp 98.2°F | Ht 63.0 in | Wt 159.0 lb

## 2014-04-04 DIAGNOSIS — R252 Cramp and spasm: Secondary | ICD-10-CM

## 2014-04-04 DIAGNOSIS — J0100 Acute maxillary sinusitis, unspecified: Secondary | ICD-10-CM

## 2014-04-04 DIAGNOSIS — I639 Cerebral infarction, unspecified: Secondary | ICD-10-CM | POA: Diagnosis not present

## 2014-04-04 MED ORDER — AMOXICILLIN 500 MG PO TABS: 500.0000 mg | ORAL_TABLET | Freq: Three times a day (TID) | ORAL | Status: AC

## 2014-04-04 MED ORDER — METAXALONE 800 MG PO TABS: 800.0000 mg | ORAL_TABLET | Freq: Three times a day (TID) | ORAL | Status: DC

## 2014-04-04 NOTE — Progress Notes (Signed)
   Subjective:    Patient ID: Brenda Hall, female    DOB: February 10, 1931, 80 y.o.   MRN: [id number]  Cough This is a new problem. Episode onset: Last week  The problem has been gradually worsening. The cough is productive of sputum. Associated symptoms include headaches, myalgias and rhinorrhea. Pertinent negatives include no chest pain, ear pain, fever, shortness of breath or wheezing. She has tried OTC cough suppressant for the symptoms. The treatment provided mild relief.  started last week Hoarse Chest cough No fevers No dyspnea  Wants to discuss her leg cramps and Vit D.    Review of Systems  Constitutional: Negative for fever and activity change.  HENT: Positive for congestion and rhinorrhea. Negative for ear pain.   Eyes: Negative for discharge.  Respiratory: Positive for cough. Negative for shortness of breath and wheezing.   Cardiovascular: Negative for chest pain.  Musculoskeletal: Positive for myalgias.  Neurological: Positive for headaches.       Objective:   Physical Exam  Constitutional: She appears well-developed.  HENT:  Head: Normocephalic.  Nose: Nose normal.  Mouth/Throat: Oropharynx is clear and moist. No oropharyngeal exudate.  Neck: Neck supple.  Cardiovascular: Normal rate and normal heart sounds.   No murmur heard. Pulmonary/Chest: Effort normal and breath sounds normal. She has no wheezes.  Lymphadenopathy:    She has no cervical adenopathy.  Skin: Skin is warm and dry.  Nursing note and vitals reviewed.         Assessment & Plan:   viral syndrome with secondary sinusitis antibiotics prescribed warning signs discussed no need for x-rays or lab work   patient stop taking her vitamin D she states her leg cramps did better she does use muscle relaxer sparingly with the cautioned of drowsiness she 1 and a refill on this I showed her some stretching exercises she can do

## 2014-04-12 ENCOUNTER — Other Ambulatory Visit: Payer: Self-pay | Admitting: Family Medicine

## 2014-04-13 ENCOUNTER — Encounter (INDEPENDENT_AMBULATORY_CARE_PROVIDER_SITE_OTHER): Payer: Self-pay

## 2014-05-27 DIAGNOSIS — M353 Polymyalgia rheumatica: Secondary | ICD-10-CM | POA: Diagnosis not present

## 2014-05-27 DIAGNOSIS — E559 Vitamin D deficiency, unspecified: Secondary | ICD-10-CM | POA: Diagnosis not present

## 2014-05-27 DIAGNOSIS — Z79899 Other long term (current) drug therapy: Secondary | ICD-10-CM | POA: Diagnosis not present

## 2014-05-31 ENCOUNTER — Ambulatory Visit (INDEPENDENT_AMBULATORY_CARE_PROVIDER_SITE_OTHER): Payer: Medicare Other | Admitting: Family Medicine

## 2014-05-31 ENCOUNTER — Encounter: Payer: Self-pay | Admitting: Family Medicine

## 2014-05-31 VITALS — BP 140/80 | Ht 63.0 in | Wt 158.1 lb

## 2014-05-31 DIAGNOSIS — E785 Hyperlipidemia, unspecified: Secondary | ICD-10-CM

## 2014-05-31 DIAGNOSIS — Z79899 Other long term (current) drug therapy: Secondary | ICD-10-CM

## 2014-05-31 NOTE — Progress Notes (Signed)
   Subjective:    Patient ID: Brenda Hall, female    DOB: [birth year], 81 y.o.   MRN: [id number]  Hypertension This is a chronic problem. The current episode started more than 1 year ago. The problem has been gradually improving since onset. The problem is controlled. There are no associated agents to hypertension. There are no known risk factors for coronary artery disease. Treatments tried: lisinopril. The current treatment provides significant improvement. There are no compliance problems.    Patient states that she has a a bad stomach virus (vomiting and diarrhea) a few days ago but she is feeling better now.   Saw dr Patrecia Pour re and follows by arthritis Hx of leg cramps with pt thinking due to vit D???  Added a baby asa to regime  Pt not taking calcium, on a once per wk fosamax  Watching fats in the diet, and pt notes compliant. With no s e's fr the med. Exercising regularly  Some cramping pain in the stomach, still sig diarhea but better.  Had ham bisc for lunch   Compliant with blood pressure medication. No obvious side effects. Watching salt in diet.  No further history of stroke symptomatology. No TIAs.  No significant chest pain. No exertional discomfort.  Now on Fosamax for osteoporosis but patient stopped vitamin D due to cramps. Discussed  Review of Systems No headache no chest pain and back pain no abdominal pain no change in bowel habits no blood in stool ROS otherwise negative    Objective:   Physical Exam Alert no apparent distress vital stable. Blood pressure good on repeat. Lungs clear. Heart regular in rhythm. Ankles without edema.       Assessment & Plan:  Impression 1 hypertension good control #2 osteoporosis discuss #3 hyperlipidemia status uncertain #4 coronary artery disease stable #5 status post stroke stable plan maintain same medications. Diet exercise discussed. Blood work. Further recommendations based results. Patient to hold off on  vitamin D which apparently her rheumatologist agreed with. I told patient okay with me as long as her vitamin D levels stay in therapeutic range. WSL

## 2014-06-01 ENCOUNTER — Telehealth: Payer: Self-pay | Admitting: Family Medicine

## 2014-06-01 ENCOUNTER — Ambulatory Visit (INDEPENDENT_AMBULATORY_CARE_PROVIDER_SITE_OTHER): Payer: Medicare Other | Admitting: Internal Medicine

## 2014-06-01 NOTE — Telephone Encounter (Signed)
Left message to return call 

## 2014-06-01 NOTE — Telephone Encounter (Signed)
incr to four daily, rx strength locks too many older folks up

## 2014-06-01 NOTE — Telephone Encounter (Signed)
Pt was seen 2/9, an told Doc that she was having little to no Issue with her Diarrhea. She has been trying to use imodium but Its not working. She"s taken 2 today so far.   Wanting advice on what she can take outside of the imodium,   No fever, no vomiting, just stomach feels odd w/diarrhea present   [facility]

## 2014-06-02 NOTE — Telephone Encounter (Signed)
Recommendation still the same, diarrhea will improve as infxn wears off, mistake to use prescript strenth anti diarrheals,

## 2014-06-02 NOTE — Telephone Encounter (Signed)
Discussed with patient. Patient verbalized understanding. 

## 2014-06-02 NOTE — Telephone Encounter (Signed)
Discussed with pt. Pt states she did take 4 tabs yesterday. She was vomiting but that has stopped. Diarrhea woke her last night 1 -2 times. No fever. Diarrhea everytime after eating. No blood in stool but has seen some clear mucus.

## 2014-06-09 DIAGNOSIS — L570 Actinic keratosis: Secondary | ICD-10-CM | POA: Diagnosis not present

## 2014-06-09 DIAGNOSIS — E785 Hyperlipidemia, unspecified: Secondary | ICD-10-CM | POA: Diagnosis not present

## 2014-06-09 DIAGNOSIS — D0430 Carcinoma in situ of skin of unspecified part of face: Secondary | ICD-10-CM | POA: Diagnosis not present

## 2014-06-09 DIAGNOSIS — Z79899 Other long term (current) drug therapy: Secondary | ICD-10-CM | POA: Diagnosis not present

## 2014-06-10 LAB — LIPID PANEL
CHOLESTEROL TOTAL: 147 mg/dL (ref 100–199)
Chol/HDL Ratio: 2.2 ratio units (ref 0.0–4.4)
HDL: 68 mg/dL (ref >39)
LDL Calculated: 57 mg/dL (ref 0–99)
TRIGLYCERIDES: 112 mg/dL (ref 0–149)
VLDL Cholesterol Cal: 22 mg/dL (ref 5–40)

## 2014-06-10 LAB — HEPATIC FUNCTION PANEL
ALBUMIN: 3.6 g/dL (ref 3.5–4.7)
ALK PHOS: 61 IU/L (ref 39–117)
ALT: 10 IU/L (ref 0–32)
AST: 16 IU/L (ref 0–40)
BILIRUBIN, DIRECT: 0.15 mg/dL (ref 0.00–0.40)
Bilirubin Total: 0.5 mg/dL (ref 0.0–1.2)
Total Protein: 6.8 g/dL (ref 6.0–8.5)

## 2014-06-16 ENCOUNTER — Encounter: Payer: Self-pay | Admitting: Family Medicine

## 2014-06-28 DIAGNOSIS — M170 Bilateral primary osteoarthritis of knee: Secondary | ICD-10-CM | POA: Diagnosis not present

## 2014-06-28 DIAGNOSIS — M5137 Other intervertebral disc degeneration, lumbosacral region: Secondary | ICD-10-CM | POA: Diagnosis not present

## 2014-06-28 DIAGNOSIS — M8580 Other specified disorders of bone density and structure, unspecified site: Secondary | ICD-10-CM | POA: Diagnosis not present

## 2014-06-28 DIAGNOSIS — M353 Polymyalgia rheumatica: Secondary | ICD-10-CM | POA: Diagnosis not present

## 2014-07-09 ENCOUNTER — Other Ambulatory Visit: Payer: Self-pay | Admitting: Family Medicine

## 2014-08-30 ENCOUNTER — Other Ambulatory Visit: Payer: Self-pay | Admitting: Dermatology

## 2014-08-30 DIAGNOSIS — L570 Actinic keratosis: Secondary | ICD-10-CM | POA: Diagnosis not present

## 2014-08-30 DIAGNOSIS — C44319 Basal cell carcinoma of skin of other parts of face: Secondary | ICD-10-CM | POA: Diagnosis not present

## 2014-08-30 DIAGNOSIS — C44310 Basal cell carcinoma of skin of unspecified parts of face: Secondary | ICD-10-CM | POA: Diagnosis not present

## 2014-08-30 DIAGNOSIS — L281 Prurigo nodularis: Secondary | ICD-10-CM | POA: Diagnosis not present

## 2014-09-15 ENCOUNTER — Other Ambulatory Visit: Payer: Self-pay

## 2014-09-15 MED ORDER — METOPROLOL TARTRATE 25 MG PO TABS: 25.0000 mg | ORAL_TABLET | Freq: Two times a day (BID) | ORAL | Status: DC

## 2014-09-15 NOTE — Telephone Encounter (Signed)
Refill complete 

## 2014-09-16 ENCOUNTER — Other Ambulatory Visit: Payer: Self-pay

## 2014-09-16 MED ORDER — ATORVASTATIN CALCIUM 80 MG PO TABS: ORAL_TABLET | ORAL | Status: DC

## 2014-09-16 MED ORDER — LISINOPRIL 10 MG PO TABS: 10.0000 mg | ORAL_TABLET | Freq: Every day | ORAL | Status: DC

## 2014-09-20 ENCOUNTER — Other Ambulatory Visit: Payer: Self-pay | Admitting: Family Medicine

## 2014-09-29 DIAGNOSIS — C44319 Basal cell carcinoma of skin of other parts of face: Secondary | ICD-10-CM | POA: Diagnosis not present

## 2014-10-02 ENCOUNTER — Other Ambulatory Visit: Payer: Self-pay | Admitting: Family Medicine

## 2014-10-11 ENCOUNTER — Other Ambulatory Visit: Payer: Self-pay | Admitting: Cardiology

## 2014-10-11 MED ORDER — METOPROLOL TARTRATE 25 MG PO TABS: 25.0000 mg | ORAL_TABLET | Freq: Two times a day (BID) | ORAL | Status: DC

## 2014-10-11 MED ORDER — ATORVASTATIN CALCIUM 80 MG PO TABS: ORAL_TABLET | ORAL | Status: DC

## 2014-10-11 NOTE — Telephone Encounter (Signed)
Refill complete 

## 2014-10-11 NOTE — Telephone Encounter (Signed)
Needs new RX for Metoprolol sent to [facility] due to switching pharmacy / also needs Atorvastatin RX as well / tg

## 2014-10-13 ENCOUNTER — Other Ambulatory Visit: Payer: Self-pay | Admitting: Cardiology

## 2014-10-13 MED ORDER — LISINOPRIL 10 MG PO TABS: 10.0000 mg | ORAL_TABLET | Freq: Every day | ORAL | Status: DC

## 2014-10-13 NOTE — Telephone Encounter (Signed)
Needs refill on Lisinopril sent to [facility] / tg

## 2014-11-29 DIAGNOSIS — Z79899 Other long term (current) drug therapy: Secondary | ICD-10-CM | POA: Diagnosis not present

## 2014-12-13 ENCOUNTER — Encounter: Payer: Self-pay | Admitting: Family Medicine

## 2014-12-13 ENCOUNTER — Ambulatory Visit (INDEPENDENT_AMBULATORY_CARE_PROVIDER_SITE_OTHER): Payer: Medicare Other | Admitting: Family Medicine

## 2014-12-13 VITALS — BP 140/82 | Ht 63.0 in | Wt 161.0 lb

## 2014-12-13 DIAGNOSIS — I10 Essential (primary) hypertension: Secondary | ICD-10-CM

## 2014-12-13 DIAGNOSIS — E782 Mixed hyperlipidemia: Secondary | ICD-10-CM | POA: Diagnosis not present

## 2014-12-13 DIAGNOSIS — I639 Cerebral infarction, unspecified: Secondary | ICD-10-CM | POA: Diagnosis not present

## 2014-12-13 DIAGNOSIS — K589 Irritable bowel syndrome without diarrhea: Secondary | ICD-10-CM | POA: Diagnosis not present

## 2014-12-13 DIAGNOSIS — M353 Polymyalgia rheumatica: Secondary | ICD-10-CM

## 2014-12-13 NOTE — Progress Notes (Signed)
   Subjective:    Patient ID: Brenda Hall, female    DOB: [birth year], 81 y.o.   MRN: [id number]  Hyperlipidemia This is a chronic problem. The current episode started more than 1 year ago. Treatments tried: lipitor  There are no compliance problems.  Risk factors for coronary artery disease include dyslipidemia, hypertension and post-menopausal.    Back hurts at times with motion, particularly low back. Achy at times. Over-the-counter medications helped some.  Compliant with blood pressure medication. No obvious difficulties with the medicine. Watching salt intake. Exercising a fair amount.  Reflux remains stable on the medication  Pt back forom the cruise  Aerobics three times per wk  BP numbers generally good  Results for orders placed or performed in visit on 05/31/14  Lipid panel  Result Value Ref Range   Cholesterol, Total 147 100 - 199 mg/dL   Triglycerides 112 0 - 149 mg/dL   HDL 68 >39 mg/dL   VLDL Cholesterol Cal 22 5 - 40 mg/dL   LDL Calculated 57 0 - 99 mg/dL   Chol/HDL Ratio 2.2 0.0 - 4.4 ratio units  Hepatic function panel  Result Value Ref Range   Total Protein 6.8 6.0 - 8.5 g/dL   Albumin 3.6 3.5 - 4.7 g/dL   Bilirubin Total 0.5 0.0 - 1.2 mg/dL   Bilirubin, Direct 0.15 0.00 - 0.40 mg/dL   Alkaline Phosphatase 61 39 - 117 IU/L   AST 16 0 - 40 IU/L   ALT 10 0 - 32 IU/L     Review of Systems No headache no chest pain no back pain no abdominal pain no change in bowel habits    Objective:   Physical Exam  Alert vital stable. Blood pressure good on repeat. HEENT normal. Lungs clear. Heart rare rhythm. Low back diffuse mild tenderness negative straight leg raise ankles trace edema      Assessment & Plan:  Impression 1 hypertension discussed good control #2 hyperlipidemia good control #3 coronary artery disease asymptomatic #4 reflux ongoing #5 mild renal insufficiency per patient. Was told this. Obtained by specialist but the specialists has not seen  her yet. plan blood work ordered maintain same medicines diet exercise discussed follow-up in 6 months and further recommendations based on results.

## 2014-12-18 ENCOUNTER — Other Ambulatory Visit: Payer: Self-pay | Admitting: Family Medicine

## 2014-12-20 DIAGNOSIS — I10 Essential (primary) hypertension: Secondary | ICD-10-CM | POA: Diagnosis not present

## 2014-12-20 DIAGNOSIS — E782 Mixed hyperlipidemia: Secondary | ICD-10-CM | POA: Diagnosis not present

## 2014-12-21 LAB — BASIC METABOLIC PANEL
BUN/Creatinine Ratio: 18 (ref 11–26)
BUN: 23 mg/dL (ref 8–27)
CALCIUM: 9.6 mg/dL (ref 8.7–10.3)
CHLORIDE: 103 mmol/L (ref 97–108)
CO2: 24 mmol/L (ref 18–29)
Creatinine, Ser: 1.26 mg/dL — ABNORMAL HIGH (ref 0.57–1.00)
GFR calc non Af Amer: 40 mL/min/{1.73_m2} — ABNORMAL LOW (ref >59)
GFR, EST AFRICAN AMERICAN: 46 mL/min/{1.73_m2} — AB (ref >59)
Glucose: 101 mg/dL — ABNORMAL HIGH (ref 65–99)
Potassium: 4.7 mmol/L (ref 3.5–5.2)
Sodium: 142 mmol/L (ref 134–144)

## 2014-12-21 LAB — LIPID PANEL
CHOLESTEROL TOTAL: 158 mg/dL (ref 100–199)
Chol/HDL Ratio: 1.8 ratio units (ref 0.0–4.4)
HDL: 87 mg/dL (ref >39)
LDL CALC: 50 mg/dL (ref 0–99)
TRIGLYCERIDES: 104 mg/dL (ref 0–149)
VLDL CHOLESTEROL CAL: 21 mg/dL (ref 5–40)

## 2014-12-21 LAB — HEPATIC FUNCTION PANEL
ALT: 17 IU/L (ref 0–32)
AST: 20 IU/L (ref 0–40)
Albumin: 3.9 g/dL (ref 3.5–4.7)
Alkaline Phosphatase: 66 IU/L (ref 39–117)
BILIRUBIN TOTAL: 0.6 mg/dL (ref 0.0–1.2)
Bilirubin, Direct: 0.16 mg/dL (ref 0.00–0.40)
Total Protein: 6.8 g/dL (ref 6.0–8.5)

## 2014-12-22 DIAGNOSIS — M9903 Segmental and somatic dysfunction of lumbar region: Secondary | ICD-10-CM | POA: Diagnosis not present

## 2014-12-22 DIAGNOSIS — M5431 Sciatica, right side: Secondary | ICD-10-CM | POA: Diagnosis not present

## 2014-12-23 DIAGNOSIS — M9903 Segmental and somatic dysfunction of lumbar region: Secondary | ICD-10-CM | POA: Diagnosis not present

## 2014-12-23 DIAGNOSIS — M5431 Sciatica, right side: Secondary | ICD-10-CM | POA: Diagnosis not present

## 2014-12-24 DIAGNOSIS — M5431 Sciatica, right side: Secondary | ICD-10-CM | POA: Diagnosis not present

## 2014-12-24 DIAGNOSIS — M9903 Segmental and somatic dysfunction of lumbar region: Secondary | ICD-10-CM | POA: Diagnosis not present

## 2015-01-02 ENCOUNTER — Telehealth: Payer: Self-pay | Admitting: Family Medicine

## 2015-01-02 MED ORDER — ATORVASTATIN CALCIUM 80 MG PO TABS: ORAL_TABLET | ORAL | Status: DC

## 2015-01-02 NOTE — Telephone Encounter (Signed)
Pt needs a prescription called in for atorvastatin to [facility].

## 2015-01-02 NOTE — Telephone Encounter (Signed)
Rx sent electronically to pharmacy. Patient notified. 

## 2015-01-02 NOTE — Telephone Encounter (Signed)
Last filled 10/11/14 by Carlyle Dolly

## 2015-01-02 NOTE — Telephone Encounter (Signed)
Ok lets six mo worth

## 2015-01-03 DIAGNOSIS — M353 Polymyalgia rheumatica: Secondary | ICD-10-CM | POA: Diagnosis not present

## 2015-01-03 DIAGNOSIS — M5137 Other intervertebral disc degeneration, lumbosacral region: Secondary | ICD-10-CM | POA: Diagnosis not present

## 2015-01-03 DIAGNOSIS — R6889 Other general symptoms and signs: Secondary | ICD-10-CM | POA: Diagnosis not present

## 2015-01-03 DIAGNOSIS — M19041 Primary osteoarthritis, right hand: Secondary | ICD-10-CM | POA: Diagnosis not present

## 2015-01-10 ENCOUNTER — Ambulatory Visit (INDEPENDENT_AMBULATORY_CARE_PROVIDER_SITE_OTHER): Payer: Medicare Other | Admitting: Cardiology

## 2015-01-10 ENCOUNTER — Encounter: Payer: Self-pay | Admitting: Cardiology

## 2015-01-10 VITALS — BP 140/82 | HR 66 | Ht 63.0 in | Wt 161.0 lb

## 2015-01-10 DIAGNOSIS — Z136 Encounter for screening for cardiovascular disorders: Secondary | ICD-10-CM

## 2015-01-10 DIAGNOSIS — I639 Cerebral infarction, unspecified: Secondary | ICD-10-CM

## 2015-01-10 NOTE — Patient Instructions (Signed)
Your physician wants you to follow-up in: 1 YEAR WITH DR. BRANCH. You will receive a reminder letter in the mail two months in advance. If you don't receive a letter, please call our office to schedule the follow-up appointment.  Your physician recommends that you continue on your current medications as directed. Please refer to the Current Medication list given to you today.   Thanks for choosing [facility]!!!    

## 2015-01-10 NOTE — Progress Notes (Signed)
Patient ID: Brenda Hall, female   DOB: 03-Nov-1930, 81 y.o.   MRN: [id number]     Clinical Summary Brenda Hall is a 81 y.o.female seen today for follow up of the following medical problems.   1. CAD - prior NSTEMI 12/31/2005, she had a BMS placed to LCX. LVEF at that time 52% - echo 03/2008 LVEF normal, no LVEF given  - denies any chest pain. No SOB or DOE. Continues water aerobics 3 days a week without symptoms. - compliant with meds  2. Hx of TIA - on aggrenox and statin for secondary prevention   3, Hyperlipidemia - compliant with atorvastatin,  - last panel 11/2014 TC 158, TG 104, HDL 87, LDL 50  4. HTN - compliant with meds  5. CKD - recently referred with nephrology she reports  SH: former underwater photographer/scuba diver x 25 years.  Past Medical History  Diagnosis Date  . Coronary atherosclerosis of unspecified type of vessel, native or graft   . Other diseases of lung, not elsewhere classified     lung nodule  . DJD (degenerative joint disease) of knee   . IBS (irritable bowel syndrome)   . Polymyalgia rheumatica   . HTN (hypertension)   . Chest pain, unspecified   . History of TIA (transient ischemic attack)   . Myocardial infarction     5 yrs ago     dr wall  cardiac  . Anemia   . CHF (congestive heart failure)   . Stroke   . Osteoporosis   . Carotid stenosis     mild  . Chronic back pain   . Cancer 1988 and  2009    BCC nose and  Melanoma back  . Cancer of skin Jan. 2014    [facility] Forehead and Nose     Allergies  Allergen Reactions  . Epinephrine Other (See Comments)    During dental procedure-caused facial swelling at injection site. Pt has had epinephrine since then for nose and facial surgery and did fine with it per pt.  . Clopidogrel Bisulfate Itching and Rash    Plavix     Current Outpatient Prescriptions  Medication Sig Dispense Refill  . AGGRENOX 25-200 MG per 12 hr capsule TAKE 1 CAPSULE TWICE A DAY 60 capsule 0  .  aspirin 81 MG tablet Take 81 mg by mouth daily.    Marland Kitchen atorvastatin (LIPITOR) 80 MG tablet TAKE ONE (1) TABLET AT BEDTIME 30 tablet 5  . CALCIUM & MAGNESIUM CARBONATES PO Take by mouth.    . diphenoxylate-atropine (LOMOTIL) 2.5-0.025 MG per tablet Take 1 tablet by mouth 2 (two) times daily as needed for diarrhea or loose stools. 60 tablet 0  . fluticasone (FLONASE) 50 MCG/ACT nasal spray USE 2 SPRAYS IN EACH NOSTRIL ONCE DAILY 16 g 2  . lisinopril (PRINIVIL,ZESTRIL) 10 MG tablet Take 1 tablet (10 mg total) by mouth daily. 90 tablet 1  . loperamide (IMODIUM) 2 MG capsule Take by mouth as needed for diarrhea or loose stools.    . metaxalone (SKELAXIN) 800 MG tablet Take 1 tablet (800 mg total) by mouth 3 (three) times daily. 30 tablet 3  . metoprolol tartrate (LOPRESSOR) 25 MG tablet Take 1 tablet (25 mg total) by mouth 2 (two) times daily. 180 tablet 1  . nitroGLYCERIN (NITROSTAT) 0.4 MG SL tablet Place 0.4 mg under the tongue every 5 (five) minutes as needed for chest pain.    . pantoprazole (PROTONIX) 40 MG tablet Take 40 mg by  mouth daily.     Marland Kitchen PREMARIN 0.3 MG tablet TAKE 1 TABLET DAILY 90 tablet 3  . Zinc 50 MG CAPS Take 50 mg by mouth daily.     No current facility-administered medications for this visit.     Past Surgical History  Procedure Laterality Date  . Lumbar laminectomy    . Removal of melanoma from back  2007  . Knee arthroscopy    . Total knee anthroplasty    . Cardiac catheterization    . Vertebroplasty  07/11/2011    Procedure: VERTEBROPLASTY;  Surgeon: Faythe Ghee, MD;  Location: [facility];  Service: Neurosurgery;  Laterality: N/A;  Vertebroplasty of Lumbar Two  . Spine surgery  2013    Broken back: fusion surgery  . Pr vein bypass graft,aorto-fem-pop  2010  . Abdominal hysterectomy  1977    partial  . Back surgery  1982  . Tonisllectomy  1944  . Colonoscopy N/A 01/27/2013    Procedure: COLONOSCOPY;  Surgeon: Rogene Houston, MD;  Location: [facility];   Service: Endoscopy;  Laterality: N/A;  1030  . Joint replacement  2001 and  2011    6 knee surgeries   bil replacement     Allergies  Allergen Reactions  . Epinephrine Other (See Comments)    During dental procedure-caused facial swelling at injection site. Pt has had epinephrine since then for nose and facial surgery and did fine with it per pt.  . Clopidogrel Bisulfate Itching and Rash    Plavix      Family History  Problem Relation Age of Onset  . Heart disease Father   . Hypertension Father   . Heart attack Father   . Cancer Mother     bladder   . Colon cancer Neg Hx   . Hypertension Daughter   . Hypertension Son      Social History Brenda Hall reports that she has never smoked. She has never used smokeless tobacco. Brenda Hall reports that she drinks alcohol.   Review of Systems CONSTITUTIONAL: No weight loss, fever, chills, weakness or fatigue.  HEENT: Eyes: No visual loss, blurred vision, double vision or yellow sclerae.No hearing loss, sneezing, congestion, runny nose or sore throat.  SKIN: No rash or itching.  CARDIOVASCULAR: per HPI RESPIRATORY: No shortness of breath, cough or sputum.  GASTROINTESTINAL: No anorexia, nausea, vomiting or diarrhea. No abdominal pain or blood.  GENITOURINARY: No burning on urination, no polyuria NEUROLOGICAL: No headache, dizziness, syncope, paralysis, ataxia, numbness or tingling in the extremities. No change in bowel or bladder control.  MUSCULOSKELETAL: No muscle, back pain, joint pain or stiffness.  LYMPHATICS: No enlarged nodes. No history of splenectomy.  PSYCHIATRIC: No history of depression or anxiety.  ENDOCRINOLOGIC: No reports of sweating, cold or heat intolerance. No polyuria or polydipsia.  Marland Kitchen   Physical Examination Filed Vitals:   01/10/15 0927  BP: 140/82  Pulse: 66   Filed Vitals:   01/10/15 0927  Height: 5\' 3"  (1.6 m)  Weight: 161 lb (73.029 kg)    Gen: resting comfortably, no acute  distress HEENT: no scleral icterus, pupils equal round and reactive, no palptable cervical adenopathy,  CV: RRR, no m/r/g, no JVD Resp: Clear to auscultation bilaterally GI: abdomen is soft, non-tender, non-distended, normal bowel sounds, no hepatosplenomegaly MSK: extremities are warm, no edema.  Skin: warm, no rash Neuro:  no focal deficits Psych: appropriate affect   Diagnostic Studies 02/2013 Carotid US <40% bilateral ICA disease  03/2008 Echo  SUMMARY - Overall left ventricular systolic function was normal. There were no left ventricular regional wall motion abnormalities. Left ventricular wall thickness was mildly increased. - There was trivial aortic valvular regurgitation. - The effective orifice of mitral regurgitation by proximal isovelocity surface area was 0.06 cm^2. The volume of mitral regurgitation by proximal isovelocity surface area was 9 cc. - The left atrium was mildly dilated. - The estimated peak right ventricular systolic pressure was within the upper limits of normal. - The right atrium was mildly dilated.    Assessment and Plan  1. CAD - no current symptoms, continue secondary prevention and risk factor modification  2. Hx of TIA - continue aggrenox and statin  3. Hyperlipidemia - at goal, continue high dose statin   F/u 1 year   Arnoldo Lenis, M.D.

## 2015-01-31 ENCOUNTER — Other Ambulatory Visit: Payer: Self-pay | Admitting: Family Medicine

## 2015-01-31 ENCOUNTER — Other Ambulatory Visit: Payer: Self-pay | Admitting: Dermatology

## 2015-01-31 DIAGNOSIS — D040 Carcinoma in situ of skin of lip: Secondary | ICD-10-CM | POA: Diagnosis not present

## 2015-01-31 DIAGNOSIS — L820 Inflamed seborrheic keratosis: Secondary | ICD-10-CM | POA: Diagnosis not present

## 2015-01-31 DIAGNOSIS — D239 Other benign neoplasm of skin, unspecified: Secondary | ICD-10-CM | POA: Diagnosis not present

## 2015-01-31 DIAGNOSIS — N750 Cyst of Bartholin's gland: Secondary | ICD-10-CM | POA: Diagnosis not present

## 2015-01-31 DIAGNOSIS — D0430 Carcinoma in situ of skin of unspecified part of face: Secondary | ICD-10-CM | POA: Diagnosis not present

## 2015-01-31 DIAGNOSIS — D485 Neoplasm of uncertain behavior of skin: Secondary | ICD-10-CM | POA: Diagnosis not present

## 2015-02-01 ENCOUNTER — Telehealth: Payer: Self-pay | Admitting: Family Medicine

## 2015-02-01 DIAGNOSIS — M545 Low back pain: Secondary | ICD-10-CM | POA: Diagnosis not present

## 2015-02-01 DIAGNOSIS — M9905 Segmental and somatic dysfunction of pelvic region: Secondary | ICD-10-CM | POA: Diagnosis not present

## 2015-02-01 MED ORDER — ASPIRIN-DIPYRIDAMOLE ER 25-200 MG PO CP12: 1.0000 | ORAL_CAPSULE | Freq: Two times a day (BID) | ORAL | Status: DC

## 2015-02-01 NOTE — Telephone Encounter (Signed)
Pt is needing a 30 day supply on her aggrenox sent to [place] pharmacy because she is out. Pt will also need [facility]

## 2015-02-01 NOTE — Telephone Encounter (Signed)
Notified patient meds sent to pharmacy.  

## 2015-02-02 DIAGNOSIS — M9905 Segmental and somatic dysfunction of pelvic region: Secondary | ICD-10-CM | POA: Diagnosis not present

## 2015-02-02 DIAGNOSIS — M545 Low back pain: Secondary | ICD-10-CM | POA: Diagnosis not present

## 2015-02-23 DIAGNOSIS — M545 Low back pain: Secondary | ICD-10-CM | POA: Diagnosis not present

## 2015-02-23 DIAGNOSIS — M9905 Segmental and somatic dysfunction of pelvic region: Secondary | ICD-10-CM | POA: Diagnosis not present

## 2015-02-24 DIAGNOSIS — M9905 Segmental and somatic dysfunction of pelvic region: Secondary | ICD-10-CM | POA: Diagnosis not present

## 2015-02-24 DIAGNOSIS — M545 Low back pain: Secondary | ICD-10-CM | POA: Diagnosis not present

## 2015-03-02 DIAGNOSIS — Z79899 Other long term (current) drug therapy: Secondary | ICD-10-CM | POA: Diagnosis not present

## 2015-03-07 ENCOUNTER — Ambulatory Visit (INDEPENDENT_AMBULATORY_CARE_PROVIDER_SITE_OTHER): Payer: Medicare Other

## 2015-03-07 DIAGNOSIS — Z23 Encounter for immunization: Secondary | ICD-10-CM

## 2015-03-09 ENCOUNTER — Other Ambulatory Visit: Payer: Self-pay | Admitting: Dermatology

## 2015-03-09 DIAGNOSIS — C44712 Basal cell carcinoma of skin of right lower limb, including hip: Secondary | ICD-10-CM | POA: Diagnosis not present

## 2015-03-09 DIAGNOSIS — D0430 Carcinoma in situ of skin of unspecified part of face: Secondary | ICD-10-CM | POA: Diagnosis not present

## 2015-04-07 ENCOUNTER — Other Ambulatory Visit: Payer: Self-pay | Admitting: Family Medicine

## 2015-04-12 ENCOUNTER — Encounter: Payer: Self-pay | Admitting: Family Medicine

## 2015-04-12 ENCOUNTER — Ambulatory Visit (INDEPENDENT_AMBULATORY_CARE_PROVIDER_SITE_OTHER): Payer: Medicare Other | Admitting: Family Medicine

## 2015-04-12 VITALS — BP 142/90 | Temp 98.1°F | Wt 166.0 lb

## 2015-04-12 DIAGNOSIS — I639 Cerebral infarction, unspecified: Secondary | ICD-10-CM | POA: Diagnosis not present

## 2015-04-12 DIAGNOSIS — J019 Acute sinusitis, unspecified: Secondary | ICD-10-CM

## 2015-04-12 DIAGNOSIS — B9689 Other specified bacterial agents as the cause of diseases classified elsewhere: Secondary | ICD-10-CM

## 2015-04-12 MED ORDER — AZITHROMYCIN 250 MG PO TABS: ORAL_TABLET | ORAL | Status: DC

## 2015-04-12 NOTE — Progress Notes (Signed)
   Subjective:    Patient ID: Brenda Hall, female    DOB: 01/10/1931, 81 y.o.   MRN: [id number]  Sinusitis This is a new problem. Episode onset: 3 days ago. Associated symptoms include congestion, coughing and a sore throat. Pertinent negatives include no ear pain or shortness of breath. (Runny nose, wheezing,) Treatments tried: tylenol sinus.   PMH benign   Review of Systems  Constitutional: Negative for fever and activity change.  HENT: Positive for congestion, rhinorrhea and sore throat. Negative for ear pain.   Eyes: Negative for discharge.  Respiratory: Positive for cough. Negative for shortness of breath and wheezing.   Cardiovascular: Negative for chest pain.       Objective:   Physical Exam  Constitutional: She appears well-developed.  HENT:  Head: Normocephalic.  Nose: Nose normal.  Mouth/Throat: Oropharynx is clear and moist. No oropharyngeal exudate.  Neck: Neck supple.  Cardiovascular: Normal rate and normal heart sounds.   No murmur heard. Pulmonary/Chest: Effort normal and breath sounds normal. She has no wheezes.  Lymphadenopathy:    She has no cervical adenopathy.  Skin: Skin is warm and dry.  Nursing note and vitals reviewed.         Assessment & Plan:  Patient was seen today for upper respiratory illness. It is felt that the patient is dealing with sinusitis. Antibiotics were prescribed today. Importance of compliance with medication was discussed. Symptoms should gradually resolve over the course of the next several days. If high fevers, progressive illness, difficulty breathing, worsening condition or failure for symptoms to improve over the next several days then the patient is to follow-up. If any emergent conditions the patient is to follow-up in the emergency department otherwise to follow-up in the office.

## 2015-04-12 NOTE — Patient Instructions (Signed)

## 2015-04-20 ENCOUNTER — Encounter: Payer: Self-pay | Admitting: Family

## 2015-04-27 DIAGNOSIS — C44711 Basal cell carcinoma of skin of unspecified lower limb, including hip: Secondary | ICD-10-CM | POA: Diagnosis not present

## 2015-04-28 ENCOUNTER — Telehealth: Payer: Self-pay | Admitting: Family Medicine

## 2015-04-28 ENCOUNTER — Other Ambulatory Visit: Payer: Self-pay | Admitting: Family

## 2015-04-28 ENCOUNTER — Encounter: Payer: Self-pay | Admitting: Family

## 2015-04-28 ENCOUNTER — Ambulatory Visit (HOSPITAL_COMMUNITY)
Admission: RE | Admit: 2015-04-28 | Discharge: 2015-04-28 | Disposition: A | Discharge status: Home / Self Care | Payer: Medicare Other | Source: Ambulatory Visit | Attending: Family | Admitting: Family

## 2015-04-28 ENCOUNTER — Ambulatory Visit (INDEPENDENT_AMBULATORY_CARE_PROVIDER_SITE_OTHER): Payer: Medicare Other | Admitting: Family

## 2015-04-28 VITALS — BP 163/75 | HR 122 | Temp 97.0°F | Resp 16 | Ht 63.0 in | Wt 159.0 lb

## 2015-04-28 DIAGNOSIS — I6523 Occlusion and stenosis of bilateral carotid arteries: Secondary | ICD-10-CM

## 2015-04-28 DIAGNOSIS — I10 Essential (primary) hypertension: Secondary | ICD-10-CM | POA: Insufficient documentation

## 2015-04-28 DIAGNOSIS — R030 Elevated blood-pressure reading, without diagnosis of hypertension: Secondary | ICD-10-CM

## 2015-04-28 DIAGNOSIS — IMO0001 Reserved for inherently not codable concepts without codable children: Secondary | ICD-10-CM

## 2015-04-28 DIAGNOSIS — Z8673 Personal history of transient ischemic attack (TIA), and cerebral infarction without residual deficits: Secondary | ICD-10-CM

## 2015-04-28 MED ORDER — CEFDINIR 300 MG PO CAPS: 300.0000 mg | ORAL_CAPSULE | Freq: Two times a day (BID) | ORAL | Status: DC

## 2015-04-28 NOTE — Telephone Encounter (Signed)
Pt called stating that she is not better and would like something else to be called in. Pt was prescribed a zpac.     [facility]

## 2015-04-28 NOTE — Patient Instructions (Signed)
Stroke Prevention Some medical conditions and behaviors are associated with an increased chance of having a stroke. You may prevent a stroke by making healthy choices and managing medical conditions. HOW CAN I REDUCE MY RISK OF HAVING A STROKE?   Stay physically active. Get at least 30 minutes of activity on most or all days.  Do not smoke. It may also be helpful to avoid exposure to secondhand smoke.  Limit alcohol use. Moderate alcohol use is considered to be:  No more than 2 drinks per day for men.  No more than 1 drink per day for nonpregnant women.  Eat healthy foods. This involves:  Eating 5 or more servings of fruits and vegetables a day.  Making dietary changes that address high blood pressure (hypertension), high cholesterol, diabetes, or obesity.  Manage your cholesterol levels.  Making food choices that are high in fiber and low in saturated fat, trans fat, and cholesterol may control cholesterol levels.  Take any prescribed medicines to control cholesterol as directed by your health care provider.  Manage your diabetes.  Controlling your carbohydrate and sugar intake is recommended to manage diabetes.  Take any prescribed medicines to control diabetes as directed by your health care provider.  Control your hypertension.  Making food choices that are low in salt (sodium), saturated fat, trans fat, and cholesterol is recommended to manage hypertension.  Ask your health care provider if you need treatment to lower your blood pressure. Take any prescribed medicines to control hypertension as directed by your health care provider.  If you are 18-39 years of age, have your blood pressure checked every 3-5 years. If you are 40 years of age or older, have your blood pressure checked every year.  Maintain a healthy weight.  Reducing calorie intake and making food choices that are low in sodium, saturated fat, trans fat, and cholesterol are recommended to manage  weight.  Stop drug abuse.  Avoid taking birth control pills.  Talk to your health care provider about the risks of taking birth control pills if you are over 35 years old, smoke, get migraines, or have ever had a blood clot.  Get evaluated for sleep disorders (sleep apnea).  Talk to your health care provider about getting a sleep evaluation if you snore a lot or have excessive sleepiness.  Take medicines only as directed by your health care provider.  For some people, aspirin or blood thinners (anticoagulants) are helpful in reducing the risk of forming abnormal blood clots that can lead to stroke. If you have the irregular heart rhythm of atrial fibrillation, you should be on a blood thinner unless there is a good reason you cannot take them.  Understand all your medicine instructions.  Make sure that other conditions (such as anemia or atherosclerosis) are addressed. SEEK IMMEDIATE MEDICAL CARE IF:   You have sudden weakness or numbness of the face, arm, or leg, especially on one side of the body.  Your face or eyelid droops to one side.  You have sudden confusion.  You have trouble speaking (aphasia) or understanding.  You have sudden trouble seeing in one or both eyes.  You have sudden trouble walking.  You have dizziness.  You have a loss of balance or coordination.  You have a sudden, severe headache with no known cause.  You have new chest pain or an irregular heartbeat. Any of these symptoms may represent a serious problem that is an emergency. Do not wait to see if the symptoms will   go away. Get medical help at once. Call your local emergency services (911 in [place] Do not drive yourself to the hospital.   This information is not intended to replace advice given to you by your health care provider. Make sure you discuss any questions you have with your health care provider.   Document Released: 05/16/2004 Document Revised: 04/29/2014 Document Reviewed:  10/09/2012 Elsevier Interactive Patient Education 2016 [employer]  

## 2015-04-28 NOTE — Telephone Encounter (Signed)
Left message on voicemail notifying patient that med was sent to pharmacy.  

## 2015-04-28 NOTE — Progress Notes (Signed)
Chief Complaint: Extracranial Carotid Artery Stenosis   History of Present Illness  Brenda Hall is a 82 y.o. female patient of Dr. Bridgett Larsson returns today for carotid artery stenosis follow up. She has no history of carotid artery intervention.  She exercises in the water 3x/week and feels well. She does not walk for exercise due to back issues. Had a "minor stroke" about 2010 as manifested by right hand to arm weakness and pain that then radiated to right leg, her symptoms lasted less than 30 minutes, has no residual effects from stroke, no amaurosis fugax, no facial drooping no slurred speech or aphasia, no further stroke or TIA activity since 2010. Had MI about 2008, had cardiac stent placed.  Pt reports New Medical or Surgical History: removal or more facial lesions for BCC.  Basal cell CA removed from face and scalp with skin grafting. Also had kyphoplasty in 2013. While in [place] in July 2015 she developed severe night time thigh cramps, medial aspects, relieved by Skelaxin; states she was evaluated for blood clots in her legs while in [place], states none found.  Pt Diabetic: No Pt smoker: non-smoker  Pt meds include: Statin : Yes ASA: Yes (Aggrenox) Other anticoagulants/antiplatelets: no: allergic to Plavix   Past Medical History  Diagnosis Date  . Coronary atherosclerosis of unspecified type of vessel, native or graft   . Other diseases of lung, not elsewhere classified     lung nodule  . DJD (degenerative joint disease) of knee   . IBS (irritable bowel syndrome)   . Polymyalgia rheumatica ([place])   . HTN (hypertension)   . Chest pain, unspecified   . History of TIA (transient ischemic attack)   . Myocardial infarction [facility])     5 yrs ago     dr wall  cardiac  . Anemia   . CHF (congestive heart failure) ([place])   . Stroke ([place])   . Osteoporosis   . Carotid stenosis     mild  . Chronic back pain   . Cancer ([place]) 1988 and  2009    BCC nose and  Melanoma back  .  Cancer of skin Jan. 2014    [facility] Forehead and Nose    Social History Social History  Substance Use Topics  . Smoking status: Never Smoker   . Smokeless tobacco: Never Used     Comment: tobacco use - no  . Alcohol Use: 0.0 oz/week    0 Standard drinks or equivalent per week     Comment: occasionally a glass of wine or margarita per pt    Family History Family History  Problem Relation Age of Onset  . Heart disease Father   . Hypertension Father   . Heart attack Father   . Cancer Mother     bladder   . Colon cancer Neg Hx   . Hypertension Daughter   . Hypertension Son     Surgical History Past Surgical History  Procedure Laterality Date  . Lumbar laminectomy    . Removal of melanoma from back  2007  . Knee arthroscopy    . Total knee anthroplasty    . Cardiac catheterization    . Vertebroplasty  07/11/2011    Procedure: VERTEBROPLASTY;  Surgeon: Faythe Ghee, MD;  Location: [place] [facility];  Service: Neurosurgery;  Laterality: N/A;  Vertebroplasty of Lumbar Two  . Spine surgery  2013    Broken back: fusion surgery  . Pr vein bypass graft,aorto-fem-pop  2010  .  Abdominal hysterectomy  1977    partial  . Back surgery  1982  . Tonisllectomy  1944  . Colonoscopy N/A 01/27/2013    Procedure: COLONOSCOPY;  Surgeon: Rogene Houston, MD;  [facility];  Service: Endoscopy;  Laterality: N/A;  1030  . Joint replacement  2001 and  2011    6 knee surgeries   bil replacement    Allergies  Allergen Reactions  . Epinephrine Other (See Comments)    During dental procedure-caused facial swelling at injection site. Pt has had epinephrine since then for nose and facial surgery and did fine with it per pt.  . Clopidogrel Bisulfate Itching and Rash    Plavix    Current Outpatient Prescriptions  Medication Sig Dispense Refill  . aspirin 81 MG tablet Take 81 mg by mouth daily.    Marland Kitchen atorvastatin (LIPITOR) 80 MG tablet TAKE ONE (1) TABLET AT BEDTIME 30 tablet 5  .  azithromycin (ZITHROMAX Z-PAK) 250 MG tablet Take 2 tablets (500 mg) on  Day 1,  followed by 1 tablet (250 mg) once daily on Days 2 through 5. 6 each 0  . CALCIUM & MAGNESIUM CARBONATES PO Take by mouth.    . cefdinir (OMNICEF) 300 MG capsule Take 1 capsule (300 mg total) by mouth 2 (two) times daily. For 10 days 20 capsule 0  . diphenoxylate-atropine (LOMOTIL) 2.5-0.025 MG per tablet Take 1 tablet by mouth 2 (two) times daily as needed for diarrhea or loose stools. 60 tablet 0  . dipyridamole-aspirin (AGGRENOX) 200-25 MG 12hr capsule Take 1 capsule by mouth 2 (two) times daily. 180 capsule 1  . dipyridamole-aspirin (AGGRENOX) 200-25 MG 12hr capsule Take 1 capsule by mouth 2 (two) times daily. 60 capsule 0  . fluticasone (FLONASE) 50 MCG/ACT nasal spray USE TWO SPRAYS IN EACH NOSTRIL ONCE DAILY 16 g 3  . lisinopril (PRINIVIL,ZESTRIL) 10 MG tablet Take 1 tablet (10 mg total) by mouth daily. 90 tablet 1  . loperamide (IMODIUM) 2 MG capsule Take by mouth as needed for diarrhea or loose stools.    . metaxalone (SKELAXIN) 800 MG tablet Take 1 tablet (800 mg total) by mouth 3 (three) times daily. 30 tablet 3  . metoprolol tartrate (LOPRESSOR) 25 MG tablet Take 1 tablet (25 mg total) by mouth 2 (two) times daily. 180 tablet 1  . nitroGLYCERIN (NITROSTAT) 0.4 MG SL tablet Place 0.4 mg under the tongue every 5 (five) minutes as needed for chest pain.    . pantoprazole (PROTONIX) 40 MG tablet Take 40 mg by mouth daily.     Marland Kitchen PREMARIN 0.3 MG tablet TAKE 1 TABLET DAILY 90 tablet 3  . Zinc 50 MG CAPS Take 50 mg by mouth daily.     No current facility-administered medications for this visit.    Review of Systems : See HPI for pertinent positives and negatives.  Physical Examination  Filed Vitals:   04/28/15 1546 04/28/15 1548 04/28/15 1551  BP: 167/76 154/85 163/75  Pulse: 118 122 122  Temp: 97 F (36.1 C)    Resp: 16    Height: 5\' 3"  (1.6 m)    Weight: 159 lb (72.122 kg)    SpO2: 97%     Body  mass index is 28.17 kg/(m^2).    General: WDWN female in NAD GAIT: normal Eyes: PERRLA Pulmonary: CTAB, no rales, rhonchi, or wheezing.  Cardiac: regular rhythm, no detected murmur  VASCULAR EXAM Carotid Bruits Left Right   Negative Negative  Radial pulses  are 1+ palpable and equal. Aorta is not palpable      LE Pulses LEFT RIGHT   FEMORAL 2+ palpable 2+ palpable    POPLITEAL  not palpable  not palpable   POSTERIOR TIBIAL not palpable  not palpable    DORSALIS PEDIS  ANTERIOR TIBIAL 2+palpable  1+palpable     Gastrointestinal: soft, nontender, BS WNL, no r/g, no palpable masses.  Musculoskeletal: No muscle atrophy/wasting. M/S 5/5 throughout, Extremities without ischemic changes.  Neurologic: A&O X 3; Appropriate Affect,  Speech is normal, loquacious,  CN 2-12 intact except some hearing loss, Pain and light touch intact in extremities, Motor exam as listed above.         Non-Invasive Vascular Imaging CAROTID DUPLEX 04/28/2015   CEREBROVASCULAR DUPLEX EVALUATION    INDICATION: Carotid artery stenosis    PREVIOUS INTERVENTION(S): NA    DUPLEX EXAM:     RIGHT  LEFT  Peak Systolic Velocities (cm/s) End Diastolic Velocities (cm/s) Plaque LOCATION Peak Systolic Velocities (cm/s) End Diastolic Velocities (cm/s) Plaque  128 9  CCA PROXIMAL 106 16   62 12  CCA MID 77 16   73 13 HT CCA DISTAL 73 16 CP  218 9 HT ECA 82 2 CP  95 21 HT ICA PROXIMAL 77 21 CP  93 26  ICA MID 93 21   65 16  ICA DISTAL 68 17     1.53 ICA / CCA Ratio (PSV) 1.0  Antegrade Vertebral Flow Antegrade  NA Brachial Systolic Pressure (mmHg) NA  NA Brachial Artery Waveforms NA    Plaque Morphology:  HM = Homogeneous, HT = Heterogeneous, CP = Calcific Plaque, SP = Smooth Plaque, IP = Irregular  Plaque     ADDITIONAL FINDINGS: Right subclavian artery PSV135cm/sec; Left subclavian artery PSV206cm/sec    IMPRESSION: Bilateral internal carotid artery stenosis present in the less than 40% range. Right external carotid artery stenosis.    Compared to the previous exam:  Essentially unchanged since previous study on 03/30/2014.     Assessment: Ragan H Holzmann is a 82 y.o. female who has a history of TIA in 2010, no TIA or stroke subsequently. Today's carotid duplex suggests minimal bilateral ICA stenosis. Essentially unchanged since previous study on 03/30/2014.  I advised pt to see her PCP as soon as possible re her elevated blood pressure.   Plan: Follow-up in 1 year with Carotid Duplex scan.   I discussed in depth with the patient the nature of atherosclerosis, and emphasized the importance of maximal medical management including strict control of blood pressure, blood glucose, and lipid levels, obtaining regular exercise, and continued cessation of smoking.  The patient is aware that without maximal medical management the underlying atherosclerotic disease process will progress, limiting the benefit of any interventions. The patient was given information about stroke prevention and what symptoms should prompt the patient to seek immediate medical care. Thank you for allowing [place] to participate in this patient's care.  Clemon Chambers, RN, MSN, FNP-C [facility] Office: [phone]  Clinic Physician: Bridgett Larsson  04/28/2015 3:41 PM

## 2015-04-28 NOTE — Telephone Encounter (Signed)
Patient seen 04/12/15 dx with sinus infection - given z pack

## 2015-04-28 NOTE — Telephone Encounter (Signed)
omnicef 300 bid ten d 

## 2015-04-28 NOTE — Progress Notes (Signed)
Filed Vitals:   04/28/15 1546 04/28/15 1548 04/28/15 1551  BP: 167/76 154/85 163/75  Pulse: 118 122 122  Temp: 97 F (36.1 C)    Resp: 16    Height: 5\' 3"  (1.6 m)    Weight: 159 lb (72.122 kg)    SpO2: 97%

## 2015-05-09 DIAGNOSIS — H2513 Age-related nuclear cataract, bilateral: Secondary | ICD-10-CM | POA: Diagnosis not present

## 2015-05-09 DIAGNOSIS — H02831 Dermatochalasis of right upper eyelid: Secondary | ICD-10-CM | POA: Diagnosis not present

## 2015-05-09 DIAGNOSIS — H02834 Dermatochalasis of left upper eyelid: Secondary | ICD-10-CM | POA: Diagnosis not present

## 2015-05-11 ENCOUNTER — Other Ambulatory Visit: Payer: Self-pay

## 2015-05-11 MED ORDER — METOPROLOL TARTRATE 25 MG PO TABS: 25.0000 mg | ORAL_TABLET | Freq: Two times a day (BID) | ORAL | Status: DC

## 2015-05-11 NOTE — Telephone Encounter (Signed)
Refill complete 

## 2015-05-23 DIAGNOSIS — Z1231 Encounter for screening mammogram for malignant neoplasm of breast: Secondary | ICD-10-CM | POA: Diagnosis not present

## 2015-05-23 DIAGNOSIS — M8589 Other specified disorders of bone density and structure, multiple sites: Secondary | ICD-10-CM | POA: Diagnosis not present

## 2015-05-23 DIAGNOSIS — Z803 Family history of malignant neoplasm of breast: Secondary | ICD-10-CM | POA: Diagnosis not present

## 2015-06-08 DIAGNOSIS — N182 Chronic kidney disease, stage 2 (mild): Secondary | ICD-10-CM | POA: Diagnosis not present

## 2015-06-08 DIAGNOSIS — I10 Essential (primary) hypertension: Secondary | ICD-10-CM | POA: Diagnosis not present

## 2015-06-12 ENCOUNTER — Encounter: Payer: Self-pay | Admitting: Family Medicine

## 2015-06-12 ENCOUNTER — Ambulatory Visit (INDEPENDENT_AMBULATORY_CARE_PROVIDER_SITE_OTHER): Payer: Medicare Other | Admitting: Family Medicine

## 2015-06-12 VITALS — BP 120/76 | Ht 63.0 in | Wt 166.4 lb

## 2015-06-12 DIAGNOSIS — N3946 Mixed incontinence: Secondary | ICD-10-CM | POA: Diagnosis not present

## 2015-06-12 DIAGNOSIS — E782 Mixed hyperlipidemia: Secondary | ICD-10-CM

## 2015-06-12 DIAGNOSIS — R7989 Other specified abnormal findings of blood chemistry: Secondary | ICD-10-CM

## 2015-06-12 DIAGNOSIS — I6523 Occlusion and stenosis of bilateral carotid arteries: Secondary | ICD-10-CM | POA: Diagnosis not present

## 2015-06-12 DIAGNOSIS — I10 Essential (primary) hypertension: Secondary | ICD-10-CM | POA: Diagnosis not present

## 2015-06-12 DIAGNOSIS — R748 Abnormal levels of other serum enzymes: Secondary | ICD-10-CM

## 2015-06-12 MED ORDER — LISINOPRIL 10 MG PO TABS: 10.0000 mg | ORAL_TABLET | Freq: Every day | ORAL | Status: DC

## 2015-06-12 MED ORDER — ATORVASTATIN CALCIUM 80 MG PO TABS: ORAL_TABLET | ORAL | Status: DC

## 2015-06-12 NOTE — Progress Notes (Signed)
   Subjective:    Patient ID: Brenda Hall, female    DOB: 03-27-1931, 82 y.o.   MRN: [id number]  Hypertension This is a chronic problem. The current episode started more than 1 year ago. The problem is controlled. There are no associated agents to hypertension. There are no known risk factors for coronary artery disease. Treatments tried: lisinopril. The current treatment provides moderate improvement. There are no compliance problems.    Patient states that she is having trouble with incontinence. She would like to talk with the doctor about this today.   Hx of incont took multiple meds for this, most did not help, did acupuncture and it seemed to help,  Patient also went on to see nephrologist. This was at the encouragement of her rheumatologist. Her creatinine was 1.26. A moderate glomerular filtration rate was 45. Patient was advised by nephrologist that her renal function is satisfactory at this time. He was concerned about her blood pressure  Patient compliant with lipid medication. Results reviewed today. No obvious side effects. Has cut fat intake down. Old lab results reviewed   Swimming three hrs per wk,   Review of Systems No headache no chest pain no back pain no abdominal pain no change in bowel habits    Objective:   Physical Exam  Alert vitals stable. HEENT normal. Lungs clear. Heart regular rhythm. Ankles without edema      Assessment & Plan:  Impression 1 incontinence mixed in nature patient has not succeeded with multiple meds in past. Achieve some benefit from acupuncture through [facility] but does not think insurance will cover any longer wishes to hold off on further meds for now after considerable discussion #2 hypertension good control maintain same dose medications. #3 hyperlipidemia control uncertain time check blood work discussed #4 renal insufficiency mild in nature discussed plan appropriate blood work. All meds review. And refilled. Diet  exercise discussed recheck in 6 months WSL

## 2015-06-13 ENCOUNTER — Other Ambulatory Visit (HOSPITAL_COMMUNITY): Payer: Self-pay | Admitting: Nephrology

## 2015-06-13 DIAGNOSIS — N182 Chronic kidney disease, stage 2 (mild): Secondary | ICD-10-CM

## 2015-06-16 ENCOUNTER — Ambulatory Visit (HOSPITAL_COMMUNITY)
Admission: RE | Admit: 2015-06-16 | Discharge: 2015-06-16 | Disposition: A | Discharge status: Home / Self Care | Payer: Medicare Other | Source: Ambulatory Visit | Attending: Nephrology | Admitting: Nephrology

## 2015-06-16 DIAGNOSIS — R748 Abnormal levels of other serum enzymes: Secondary | ICD-10-CM | POA: Diagnosis not present

## 2015-06-16 DIAGNOSIS — N189 Chronic kidney disease, unspecified: Secondary | ICD-10-CM | POA: Insufficient documentation

## 2015-06-16 DIAGNOSIS — I10 Essential (primary) hypertension: Secondary | ICD-10-CM | POA: Diagnosis not present

## 2015-06-16 DIAGNOSIS — E782 Mixed hyperlipidemia: Secondary | ICD-10-CM | POA: Diagnosis not present

## 2015-06-16 DIAGNOSIS — N182 Chronic kidney disease, stage 2 (mild): Secondary | ICD-10-CM

## 2015-06-17 LAB — HEPATIC FUNCTION PANEL
ALT: 13 IU/L (ref 0–32)
AST: 18 IU/L (ref 0–40)
Albumin: 3.9 g/dL (ref 3.5–4.7)
Alkaline Phosphatase: 55 IU/L (ref 39–117)
BILIRUBIN, DIRECT: 0.12 mg/dL (ref 0.00–0.40)
Bilirubin Total: 0.5 mg/dL (ref 0.0–1.2)
Total Protein: 6.6 g/dL (ref 6.0–8.5)

## 2015-06-17 LAB — BASIC METABOLIC PANEL
BUN / CREAT RATIO: 16 (ref 11–26)
BUN: 21 mg/dL (ref 8–27)
CALCIUM: 9.7 mg/dL (ref 8.7–10.3)
CHLORIDE: 103 mmol/L (ref 96–106)
CO2: 22 mmol/L (ref 18–29)
Creatinine, Ser: 1.3 mg/dL — ABNORMAL HIGH (ref 0.57–1.00)
GFR calc non Af Amer: 38 mL/min/{1.73_m2} — ABNORMAL LOW (ref >59)
GFR, EST AFRICAN AMERICAN: 44 mL/min/{1.73_m2} — AB (ref >59)
Glucose: 96 mg/dL (ref 65–99)
POTASSIUM: 4.8 mmol/L (ref 3.5–5.2)
SODIUM: 142 mmol/L (ref 134–144)

## 2015-06-17 LAB — LIPID PANEL
Chol/HDL Ratio: 2 ratio units (ref 0.0–4.4)
Cholesterol, Total: 170 mg/dL (ref 100–199)
HDL: 87 mg/dL (ref >39)
LDL CALC: 64 mg/dL (ref 0–99)
Triglycerides: 93 mg/dL (ref 0–149)
VLDL Cholesterol Cal: 19 mg/dL (ref 5–40)

## 2015-06-19 ENCOUNTER — Encounter: Payer: Self-pay | Admitting: Family Medicine

## 2015-06-28 DIAGNOSIS — M353 Polymyalgia rheumatica: Secondary | ICD-10-CM | POA: Diagnosis not present

## 2015-06-28 DIAGNOSIS — I7300 Raynaud's syndrome without gangrene: Secondary | ICD-10-CM | POA: Diagnosis not present

## 2015-06-28 DIAGNOSIS — M19041 Primary osteoarthritis, right hand: Secondary | ICD-10-CM | POA: Diagnosis not present

## 2015-06-28 DIAGNOSIS — M810 Age-related osteoporosis without current pathological fracture: Secondary | ICD-10-CM | POA: Diagnosis not present

## 2015-08-25 DIAGNOSIS — M9901 Segmental and somatic dysfunction of cervical region: Secondary | ICD-10-CM | POA: Diagnosis not present

## 2015-08-25 DIAGNOSIS — M531 Cervicobrachial syndrome: Secondary | ICD-10-CM | POA: Diagnosis not present

## 2015-08-28 DIAGNOSIS — M9901 Segmental and somatic dysfunction of cervical region: Secondary | ICD-10-CM | POA: Diagnosis not present

## 2015-08-28 DIAGNOSIS — M531 Cervicobrachial syndrome: Secondary | ICD-10-CM | POA: Diagnosis not present

## 2015-08-30 DIAGNOSIS — M9901 Segmental and somatic dysfunction of cervical region: Secondary | ICD-10-CM | POA: Diagnosis not present

## 2015-08-30 DIAGNOSIS — M531 Cervicobrachial syndrome: Secondary | ICD-10-CM | POA: Diagnosis not present

## 2015-09-04 DIAGNOSIS — M531 Cervicobrachial syndrome: Secondary | ICD-10-CM | POA: Diagnosis not present

## 2015-09-04 DIAGNOSIS — M9901 Segmental and somatic dysfunction of cervical region: Secondary | ICD-10-CM | POA: Diagnosis not present

## 2015-10-06 ENCOUNTER — Other Ambulatory Visit: Payer: Self-pay | Admitting: Family Medicine

## 2015-10-10 ENCOUNTER — Telehealth: Payer: Self-pay | Admitting: Family Medicine

## 2015-10-10 MED ORDER — ASPIRIN-DIPYRIDAMOLE ER 25-200 MG PO CP12: 1.0000 | ORAL_CAPSULE | Freq: Two times a day (BID) | ORAL | Status: DC

## 2015-10-10 NOTE — Telephone Encounter (Signed)
Generic fine.

## 2015-10-10 NOTE — Telephone Encounter (Signed)
Med sent to pharmacy. Patient verbalized understanding.

## 2015-10-10 NOTE — Telephone Encounter (Signed)
dipyridamole-aspirin (AGGRENOX) 200-25 MG 12hr capsule  [facility] can not get this med [facility] anymore and wants to  Know if the patient can be switched to the generic version of  This please?   Pt states if she don't need this med any further that she is happy  To stop taking it if you'd rather do that  She will need one supply sent to [facility] if you are keeping  Her on it and the rest sent to [facility]

## 2015-10-16 DIAGNOSIS — L821 Other seborrheic keratosis: Secondary | ICD-10-CM | POA: Diagnosis not present

## 2015-10-16 DIAGNOSIS — D239 Other benign neoplasm of skin, unspecified: Secondary | ICD-10-CM | POA: Diagnosis not present

## 2015-10-16 DIAGNOSIS — L570 Actinic keratosis: Secondary | ICD-10-CM | POA: Diagnosis not present

## 2015-10-20 ENCOUNTER — Telehealth: Payer: Self-pay | Admitting: Family Medicine

## 2015-10-20 NOTE — Telephone Encounter (Signed)
Discussed with pt

## 2015-10-20 NOTE — Telephone Encounter (Signed)
Pt having a cough and a stopped up nose that just started. Having basement treated for mold for the past month. Got call today about the two types of mold in her basement. See in message below. Pt states they told her not to go in basement and if she has to go to wear a mask and to open all boxes outside.

## 2015-10-20 NOTE — Telephone Encounter (Signed)
Pt verbalized understanding.

## 2015-10-20 NOTE — Telephone Encounter (Signed)
Pt has been and is continuing to be exposed to chaetomium and richodemra mold in her basement. Pt is wanting to know if she needs to be on an antibiotic or what. Please advise.

## 2015-10-20 NOTE — Telephone Encounter (Signed)
Consult with dr Nicki Reaper. Pt does not need an antibiotic. Call back if having a fever or go to urgent care or ED. Needs to wear a drywall good quality mask.

## 2015-11-29 ENCOUNTER — Other Ambulatory Visit: Payer: Self-pay | Admitting: Cardiology

## 2015-12-08 ENCOUNTER — Ambulatory Visit (INDEPENDENT_AMBULATORY_CARE_PROVIDER_SITE_OTHER): Payer: Medicare Other | Admitting: Family Medicine

## 2015-12-08 ENCOUNTER — Encounter: Payer: Self-pay | Admitting: Family Medicine

## 2015-12-08 VITALS — BP 118/76 | Ht 63.0 in | Wt 166.4 lb

## 2015-12-08 DIAGNOSIS — I10 Essential (primary) hypertension: Secondary | ICD-10-CM | POA: Diagnosis not present

## 2015-12-08 DIAGNOSIS — E782 Mixed hyperlipidemia: Secondary | ICD-10-CM

## 2015-12-08 DIAGNOSIS — I6523 Occlusion and stenosis of bilateral carotid arteries: Secondary | ICD-10-CM

## 2015-12-08 DIAGNOSIS — N3946 Mixed incontinence: Secondary | ICD-10-CM | POA: Diagnosis not present

## 2015-12-08 DIAGNOSIS — R748 Abnormal levels of other serum enzymes: Secondary | ICD-10-CM

## 2015-12-08 DIAGNOSIS — R7989 Other specified abnormal findings of blood chemistry: Secondary | ICD-10-CM

## 2015-12-08 MED ORDER — FLUTICASONE PROPIONATE 50 MCG/ACT NA SUSP: NASAL | 5 refills | Status: DC

## 2015-12-08 MED ORDER — LISINOPRIL 10 MG PO TABS: 10.0000 mg | ORAL_TABLET | Freq: Every day | ORAL | 1 refills | Status: DC

## 2015-12-08 MED ORDER — ATORVASTATIN CALCIUM 80 MG PO TABS: ORAL_TABLET | ORAL | 1 refills | Status: DC

## 2015-12-08 MED ORDER — NITROGLYCERIN 0.4 MG SL SUBL: 0.4000 mg | SUBLINGUAL_TABLET | SUBLINGUAL | 0 refills | Status: DC | PRN

## 2015-12-08 MED ORDER — ASPIRIN-DIPYRIDAMOLE ER 25-200 MG PO CP12: 1.0000 | ORAL_CAPSULE | Freq: Two times a day (BID) | ORAL | 0 refills | Status: DC

## 2015-12-08 NOTE — Progress Notes (Signed)
   Subjective:    Patient ID: Brenda Hall, female    DOB: [birth year], 82 y.o.   MRN: [id number] Patient arrives office for follow-up of her many concerns Hypertension  This is a chronic problem. The current episode started more than 1 year ago. Risk factors for coronary artery disease include post-menopausal state and dyslipidemia. Treatments tried: lisinopril,metoprolol,   Pt wearing pads and at times has severe stress and urge incont. Brings a list of all medications that she is been on in the past for this along with acupuncture. Only acupuncture seemed to help. Was hoping I would have a new and improved medication in this regard  Patient continues to take lipid medication regularly. No obvious side effects from it. Generally does not miss a dose. Prior blood work results are reviewed with patient. Patient continues to work on fat intake in diet   Patient needs refill on nitro. No current chest pain. Sees a cardiologist regularly   Patient wants to discuss constant nasal drip and sinuses, flonase does nt work as well as in the past but still some  Uses tyl sinus t night ats needed . Wonders if she can continue to use this.    Patient would like to try pill on incontinence - Patient wants something new she does NOT want detrol, oxybutynin, vesicare, or myrbetig  Review of Systems No headache, no major weight loss or weight gain, no chest pain no back pain abdominal pain no change in bowel habits complete ROS otherwise negative     Objective:   Physical Exam   Alert vitals stable, NAD. Blood pressure good on repeat. HEENT normal. Lungs clear. Heart regular rate and rhythm.      Assessment & Plan:  Impression 1 hypertension good control discussed maintain same meds #2 hyperlipidemia exact status uncertain prior blood work reviewed to maintain same for now #3 coronary artery disease clinically stable #4 incontinence ongoing challenge. I really know of no new medications for  this patient. Discussed at great length plan maintain same meds. Diet exercise discussed. Appropriate blood work. Medications refilled. Recheck in 6 months. WSL also maintain Flonase for allergy symptoms

## 2015-12-13 ENCOUNTER — Telehealth: Payer: Self-pay | Admitting: Family Medicine

## 2015-12-13 MED ORDER — HYDROCHLOROTHIAZIDE 25 MG PO TABS: ORAL_TABLET | ORAL | 2 refills | Status: DC

## 2015-12-13 NOTE — Telephone Encounter (Signed)
Spoke with patient and patient and stated that she has tight swelling to bilateral ankles. Denies SOB. States when she elevates them swelling is decreased but when she walks edema returns. Please advise?

## 2015-12-13 NOTE — Telephone Encounter (Signed)
Spoke with patient and informed her per Dr.Steve Luking- Just seen was fine in this regard, recommends office visit with carolyn later this week or with Dr.Steve next week. Patient verbalized understanding and stated that she will not be back into town until next week and would like something to be called in to help with the edema while on vacation and for the long car ride back home when legs swell the most. Please advise?

## 2015-12-13 NOTE — Telephone Encounter (Signed)
Just seen was fine in this regard o v with carolyn later thisd eweek or me next wk

## 2015-12-13 NOTE — Telephone Encounter (Signed)
Left message return call 12/13/15 (medication sent into pharmacy)

## 2015-12-13 NOTE — Telephone Encounter (Signed)
Patient has swelling in her ankles and she is at the beach.  She said it has been 3-4 days and she wants to know if we can call her something in edema.  If we will call it in to [facility], her son is coming down to the beach tomorrow and he will bring it to her.

## 2015-12-13 NOTE — Telephone Encounter (Signed)
hctz 25 mg one p o prn in am for excess swelling, do not exceed three per siven days, 30 two ref

## 2015-12-13 NOTE — Telephone Encounter (Signed)
Spoke with patient and informed her per Dr.Steve Luking- we are sending in HCTZ 25mg  one by mouth in as needed in the morning for excessive swelling. Do not exceed 3 per seven days. Patient verbalized understanding.

## 2015-12-17 ENCOUNTER — Other Ambulatory Visit: Payer: Self-pay | Admitting: Family Medicine

## 2015-12-19 DIAGNOSIS — E782 Mixed hyperlipidemia: Secondary | ICD-10-CM | POA: Diagnosis not present

## 2015-12-19 DIAGNOSIS — I10 Essential (primary) hypertension: Secondary | ICD-10-CM | POA: Diagnosis not present

## 2015-12-20 LAB — LIPID PANEL
CHOL/HDL RATIO: 2 ratio (ref 0.0–4.4)
CHOLESTEROL TOTAL: 160 mg/dL (ref 100–199)
HDL: 82 mg/dL (ref >39)
LDL Calculated: 58 mg/dL (ref 0–99)
TRIGLYCERIDES: 102 mg/dL (ref 0–149)
VLDL Cholesterol Cal: 20 mg/dL (ref 5–40)

## 2015-12-20 LAB — HEPATIC FUNCTION PANEL
ALBUMIN: 3.7 g/dL (ref 3.5–4.7)
ALT: 12 IU/L (ref 0–32)
AST: 17 IU/L (ref 0–40)
Alkaline Phosphatase: 57 IU/L (ref 39–117)
BILIRUBIN TOTAL: 0.5 mg/dL (ref 0.0–1.2)
BILIRUBIN, DIRECT: 0.14 mg/dL (ref 0.00–0.40)
Total Protein: 6.6 g/dL (ref 6.0–8.5)

## 2015-12-21 ENCOUNTER — Encounter: Payer: Self-pay | Admitting: Family Medicine

## 2015-12-21 ENCOUNTER — Ambulatory Visit (INDEPENDENT_AMBULATORY_CARE_PROVIDER_SITE_OTHER): Payer: Medicare Other | Admitting: Family Medicine

## 2015-12-21 VITALS — BP 118/74 | Ht 63.0 in | Wt 166.0 lb

## 2015-12-21 DIAGNOSIS — I878 Other specified disorders of veins: Secondary | ICD-10-CM | POA: Diagnosis not present

## 2015-12-21 DIAGNOSIS — I6523 Occlusion and stenosis of bilateral carotid arteries: Secondary | ICD-10-CM

## 2015-12-21 NOTE — Progress Notes (Signed)
   Subjective:    Patient ID: Brenda Hall, female    DOB: 11-11-1930, 82 y.o.   MRN: [id number]  HPIfollow up on edema in both feet. Better now. Took hctz 3 tabs and has not needed since. History of venous stasis. No shortness of breath no orthopnea. Swelling occurred after sustaining long ride in a vehicle Had bloodwork done on 8/29. On lipid medication  Pt states no concerns today.     Review of Systems No headache, no major weight loss or weight gain, no chest pain no back pain abdominal pain no change in bowel habits complete ROS otherwise negative     Objective:   Physical Exam Alert pleasant vitals stable talkative lungs clear heart rare rhythm ankles no edema       Assessment & Plan:  Impression transient edema with venous stasis much improved plan maintain use when necessary only additional HCTZ for this. Mostly trying not to use it. Rationale discussed with patient bloodwork results discussed follow-up as scheduled

## 2015-12-21 NOTE — Patient Instructions (Signed)
Results for orders placed or performed in visit on 12/08/15  Lipid panel  Result Value Ref Range   Cholesterol, Total 160 100 - 199 mg/dL   Triglycerides 102 0 - 149 mg/dL   HDL 82 >39 mg/dL   VLDL Cholesterol Cal 20 5 - 40 mg/dL   LDL Calculated 58 0 - 99 mg/dL   Chol/HDL Ratio 2.0 0.0 - 4.4 ratio units  Hepatic function panel  Result Value Ref Range   Total Protein 6.6 6.0 - 8.5 g/dL   Albumin 3.7 3.5 - 4.7 g/dL   Bilirubin Total 0.5 0.0 - 1.2 mg/dL   Bilirubin, Direct 0.14 0.00 - 0.40 mg/dL   Alkaline Phosphatase 57 39 - 117 IU/L   AST 17 0 - 40 IU/L   ALT 12 0 - 32 IU/L

## 2015-12-26 ENCOUNTER — Other Ambulatory Visit: Payer: Self-pay

## 2015-12-27 DIAGNOSIS — M9903 Segmental and somatic dysfunction of lumbar region: Secondary | ICD-10-CM | POA: Diagnosis not present

## 2015-12-27 DIAGNOSIS — M545 Low back pain: Secondary | ICD-10-CM | POA: Diagnosis not present

## 2015-12-28 DIAGNOSIS — M545 Low back pain: Secondary | ICD-10-CM | POA: Diagnosis not present

## 2015-12-28 DIAGNOSIS — M533 Sacrococcygeal disorders, not elsewhere classified: Secondary | ICD-10-CM | POA: Diagnosis not present

## 2015-12-28 DIAGNOSIS — M9903 Segmental and somatic dysfunction of lumbar region: Secondary | ICD-10-CM | POA: Diagnosis not present

## 2016-01-01 ENCOUNTER — Ambulatory Visit (INDEPENDENT_AMBULATORY_CARE_PROVIDER_SITE_OTHER): Payer: Medicare Other | Admitting: Family Medicine

## 2016-01-01 ENCOUNTER — Other Ambulatory Visit (HOSPITAL_COMMUNITY): Payer: Self-pay | Admitting: Rheumatology

## 2016-01-01 ENCOUNTER — Encounter: Payer: Self-pay | Admitting: Family Medicine

## 2016-01-01 VITALS — BP 116/74 | Temp 97.4°F | Ht 63.0 in | Wt 161.5 lb

## 2016-01-01 DIAGNOSIS — M545 Low back pain: Secondary | ICD-10-CM

## 2016-01-01 DIAGNOSIS — I6523 Occlusion and stenosis of bilateral carotid arteries: Secondary | ICD-10-CM

## 2016-01-01 DIAGNOSIS — B029 Zoster without complications: Secondary | ICD-10-CM

## 2016-01-01 MED ORDER — HYDROCODONE-ACETAMINOPHEN 5-325 MG PO TABS: ORAL_TABLET | ORAL | 0 refills | Status: DC

## 2016-01-01 MED ORDER — VALACYCLOVIR HCL 1 G PO TABS: ORAL_TABLET | ORAL | 0 refills | Status: DC

## 2016-01-01 NOTE — Progress Notes (Signed)
   Subjective:    Patient ID: Brenda Hall, female    DOB: 11/15/1930, 82 y.o.   MRN: [id number]  Back Pain  This is a new problem. The current episode started in the past 7 days. The pain is present in the lumbar spine. The symptoms are aggravated by position and sitting.   Patient also has concerns of Shingles.  pain severe sharp and shooting right flank  And rad ant Now has rash  Pain severe at times   Now noted a rash. Had shingles vaccine 5 years ago. No fever no cough  Review of Systems  Musculoskeletal: Positive for back pain.  No chills no rash elsewhere     Objective:   Physical Exam  Alert vitals stable, NAD. Blood pressure good on repeat. HEENT normal. Lungs clear. Heart regular rate and rhythm. Patient obviously in pain right lateral lower thoracic shingles eruption noted      Assessment & Plan:  Impression shingles discussed at length plan Valtrex 3 times a day 7 days. Pain medicine when necessary symptom care discussed WSL

## 2016-01-02 ENCOUNTER — Ambulatory Visit (HOSPITAL_COMMUNITY)
Admission: RE | Admit: 2016-01-02 | Discharge: 2016-01-02 | Disposition: A | Discharge status: Home / Self Care | Payer: Medicare Other | Source: Ambulatory Visit | Attending: Rheumatology | Admitting: Rheumatology

## 2016-01-02 DIAGNOSIS — M4607 Spinal enthesopathy, lumbosacral region: Secondary | ICD-10-CM | POA: Diagnosis not present

## 2016-01-02 DIAGNOSIS — M4856XA Collapsed vertebra, not elsewhere classified, lumbar region, initial encounter for fracture: Secondary | ICD-10-CM | POA: Diagnosis not present

## 2016-01-02 DIAGNOSIS — M5126 Other intervertebral disc displacement, lumbar region: Secondary | ICD-10-CM | POA: Insufficient documentation

## 2016-01-02 DIAGNOSIS — M5127 Other intervertebral disc displacement, lumbosacral region: Secondary | ICD-10-CM | POA: Diagnosis not present

## 2016-01-02 DIAGNOSIS — M1288 Other specific arthropathies, not elsewhere classified, other specified site: Secondary | ICD-10-CM | POA: Insufficient documentation

## 2016-01-02 DIAGNOSIS — M4806 Spinal stenosis, lumbar region: Secondary | ICD-10-CM | POA: Insufficient documentation

## 2016-01-02 DIAGNOSIS — M545 Low back pain: Secondary | ICD-10-CM | POA: Diagnosis not present

## 2016-01-05 ENCOUNTER — Other Ambulatory Visit: Payer: Self-pay | Admitting: Family Medicine

## 2016-01-05 NOTE — Telephone Encounter (Signed)
No. I had specific disc with pt that repeated rtx's for valtrex definitely does not help, pts often want it cause they are still hurting and still have the blister and crusting which can last sevreal wks, but studies show 7 d rx is proper duration and ffurther does nothing to change healing rate

## 2016-01-08 ENCOUNTER — Encounter (HOSPITAL_COMMUNITY): Payer: Self-pay | Admitting: Emergency Medicine

## 2016-01-08 ENCOUNTER — Emergency Department (HOSPITAL_COMMUNITY)
Admission: EM | Admit: 2016-01-08 | Discharge: 2016-01-09 | Disposition: A | Discharge status: Home / Self Care | Payer: Medicare Other | Attending: Emergency Medicine | Admitting: Emergency Medicine

## 2016-01-08 DIAGNOSIS — Z79899 Other long term (current) drug therapy: Secondary | ICD-10-CM | POA: Diagnosis not present

## 2016-01-08 DIAGNOSIS — B0229 Other postherpetic nervous system involvement: Secondary | ICD-10-CM | POA: Diagnosis not present

## 2016-01-08 DIAGNOSIS — I2510 Atherosclerotic heart disease of native coronary artery without angina pectoris: Secondary | ICD-10-CM | POA: Diagnosis not present

## 2016-01-08 DIAGNOSIS — Z96653 Presence of artificial knee joint, bilateral: Secondary | ICD-10-CM | POA: Insufficient documentation

## 2016-01-08 DIAGNOSIS — Z8673 Personal history of transient ischemic attack (TIA), and cerebral infarction without residual deficits: Secondary | ICD-10-CM | POA: Insufficient documentation

## 2016-01-08 DIAGNOSIS — I509 Heart failure, unspecified: Secondary | ICD-10-CM | POA: Insufficient documentation

## 2016-01-08 DIAGNOSIS — I110 Hypertensive heart disease with heart failure: Secondary | ICD-10-CM | POA: Diagnosis not present

## 2016-01-08 DIAGNOSIS — M545 Low back pain: Secondary | ICD-10-CM | POA: Diagnosis present

## 2016-01-08 DIAGNOSIS — Z85828 Personal history of other malignant neoplasm of skin: Secondary | ICD-10-CM | POA: Insufficient documentation

## 2016-01-08 DIAGNOSIS — Z7982 Long term (current) use of aspirin: Secondary | ICD-10-CM | POA: Diagnosis not present

## 2016-01-08 DIAGNOSIS — I252 Old myocardial infarction: Secondary | ICD-10-CM | POA: Diagnosis not present

## 2016-01-08 MED ORDER — DIAZEPAM 5 MG PO TABS
5.0000 mg | ORAL_TABLET | Freq: Once | ORAL | Status: AC
  Administered 2016-01-08: 5 mg via ORAL
  Filled 2016-01-08: qty 1

## 2016-01-08 MED ORDER — OXYCODONE-ACETAMINOPHEN 5-325 MG PO TABS
1.0000 | ORAL_TABLET | Freq: Once | ORAL | Status: AC
  Administered 2016-01-08: 1 via ORAL
  Filled 2016-01-08: qty 1

## 2016-01-08 NOTE — ED Triage Notes (Signed)
Pt states she has had right back pain. Pt had CT and was told she has sciatica. Pt was given injection last week by her MD. Pt states she is in terrible pain. Pt was also seen for shingles this past week and has her final pill left to take. This is the side the pain is still on. Pt states the shingles have dried up but it is still very painful.

## 2016-01-08 NOTE — ED Notes (Signed)
Pt called nurse's station crying and shouting that her pain was unbearable and extremely severe.  Pt was assessed and given oral pain medication.

## 2016-01-08 NOTE — ED Notes (Signed)
PA-C at bedside 

## 2016-01-08 NOTE — ED Provider Notes (Signed)
[facility] DEPT Provider Note   CSN: [id number] Arrival date & time: 01/08/16  1625     History   Chief Complaint Chief Complaint  Patient presents with  . Back Pain    HPI Brenda Hall is a 82 y.o. female.  Patient presents with complaint of severe low right back pain that radiates around laterally to the mid-abdomen. She reports pain that is ongoing in the low back with recent CT scan showing "spinal stenosis" per patient. She was given an IM injection by Dr. Patrecia Pour but this did not provide relief. No urinary or bowel incontinence. No LE weakness, numbness. No nausea, vomiting, fever, chest pain, SOB. She states that she developed a rash to her right abdomen and back diagnosed as shingles shortly after being seen for back pain and was prescribed valacyclovir and hydrocodone but she did not get any relief of what she describes as intense, sharp pain she is having.    The history is provided by the patient. No language interpreter was used.  Back Pain   This is a new problem. The current episode started more than 1 week ago. The problem occurs constantly. The problem has been gradually worsening. The pain is associated with no known injury. Associated symptoms include abdominal pain. Pertinent negatives include no chest pain, no fever, no numbness and no weakness.    Past Medical History:  Diagnosis Date  . Anemia   . Cancer ([place]) 1988 and  2009   BCC nose and  Melanoma back  . Cancer of skin Jan. 2014   [facility]head [facility] Nose  . Carotid stenosis    mild  . Chest pain, unspecified   . CHF (congestive heart failure) ([place])   . Chronic back pain   . Coronary atherosclerosis of unspecified type of vessel, native or graft   . DJD (degenerative joint disease) of knee   . History of TIA (transient ischemic attack)   . HTN (hypertension)   . IBS (irritable bowel syndrome)   . Myocardial infarction [facility])    5 yrs ago     dr wall  cardiac  . Osteoporosis   . Other  diseases of lung, not elsewhere classified    lung nodule  . Polymyalgia rheumatica ([place])   . Stroke [facility])     Patient Active Problem List   Diagnosis Date Noted  . Other and unspecified hyperlipidemia 11/19/2012  . Cerebrovascular accident (stroke) ([place]) 11/19/2012  . Difficulty in walking(719.7) 11/06/2012  . Occlusion and stenosis of carotid artery without mention of cerebral infarction 02/07/2012  . OLD MYOCARDIAL INFARCTION 11/07/2009  . CAD, NATIVE VESSEL 11/07/2009  . Essential hypertension 09/28/2008  . IRRITABLE BOWEL SYNDROME 09/28/2008  . DEGENERATIVE JOINT DISEASE, KNEE 09/28/2008  . POLYMYALGIA RHEUMATICA 09/28/2008  . CHEST PAIN UNSPECIFIED 09/28/2008    Past Surgical History:  Procedure Laterality Date  . ABDOMINAL HYSTERECTOMY  1977   partial  . BACK SURGERY  1982  . CARDIAC CATHETERIZATION    . COLONOSCOPY N/A 01/27/2013   Procedure: COLONOSCOPY;  Surgeon: Rogene Houston, MD;  Location[facility];  Service: Endoscopy;  Laterality: N/A;  1030  . JOINT REPLACEMENT  2001 and  2011   6 knee surgeries   bil replacement  . KNEE ARTHROSCOPY    . LUMBAR LAMINECTOMY    . PR VEIN BYPASS GRAFT,AORTO-FEM-POP  2010  . removal of melanoma from back  2007  . SPINE SURGERY  2013   Broken back: fusion surgery  .  tonisllectomy  1944  . total knee anthroplasty    . VERTEBROPLASTY  07/11/2011   Procedure: VERTEBROPLASTY;  Surgeon: Faythe Ghee, MD;  Location: [facility] NEURO ORS;  Service: Neurosurgery;  Laterality: N/A;  Vertebroplasty of Lumbar Two    OB History    No data available       Home Medications    Prior to Admission medications   Medication Sig Start Date End Date Taking? Authorizing Provider  aspirin 81 MG tablet Take 81 mg by mouth daily.    Historical Provider, MD  atorvastatin (LIPITOR) 80 MG tablet TAKE ONE (1) TABLET AT BEDTIME 12/08/15   [name], MD  CALCIUM & MAGNESIUM CARBONATES PO Take by mouth.    Historical Provider, MD    diphenoxylate-atropine (LOMOTIL) 2.5-0.025 MG per tablet Take 1 tablet by mouth 2 (two) times daily as needed for diarrhea or loose stools. 06/01/13   Rogene Houston, MD  dipyridamole-aspirin (AGGRENOX) 200-25 MG 12hr capsule Take 1 capsule by mouth 2 (two) times daily. 12/08/15   [name], MD  fluticasone [facility]) 50 MCG/ACT nasal spray USE TWO SPRAYS IN EACH NOSTRIL ONCE DAILY 12/08/15   [name], MD  hydrochlorothiazide (HYDRODIURIL) 25 MG tablet Take 1 tablet by mouth in the morning as needed for excess swelling. Do not exceed more than 3 tablet per 7 days. 12/13/15   [name], MD  HYDROcodone-acetaminophen (NORCO/VICODIN) 5-325 MG tablet Take 1 tablet by mouth every 6 hours as needed for pain 01/01/16   [name], MD  lisinopril (PRINIVIL,ZESTRIL) 10 MG tablet Take 1 tablet (10 mg total) by mouth daily. 12/08/15   [name], MD  loperamide (IMODIUM) 2 MG capsule Take by mouth as needed for diarrhea or loose stools.    Historical Provider, MD  metaxalone (SKELAXIN) 800 MG tablet Take 1 tablet (800 mg total) by mouth 3 (three) times daily. Patient not taking: Reported on 01/01/2016 04/04/14   Kathyrn Drown, MD  metoprolol tartrate (LOPRESSOR) 25 MG tablet TAKE 1 TABLET TWICE A DAY 11/29/15   Arnoldo Lenis, MD  nitroGLYCERIN (NITROSTAT) 0.4 MG SL tablet Place 1 tablet (0.4 mg total) under the tongue every 5 (five) minutes as needed for chest pain. 12/08/15   [name], MD  pantoprazole (PROTONIX) 40 MG tablet Take 40 mg by mouth daily.     Historical Provider, MD  PREMARIN 0.3 MG tablet TAKE 1 TABLET DAILY 12/19/15   [name], MD  valACYclovir (VALTREX) 1000 MG tablet Take 1 tablet by mouth three times a day for 7 days. 01/01/16   [name], MD  Zinc 50 MG CAPS Take 50 mg by mouth daily.    Historical Provider, MD    Family History Family History  Problem Relation Age of Onset  . Heart disease Father   . Hypertension Father   . Heart  attack Father   . Cancer Mother     bladder   . Hypertension Daughter   . Hypertension Son   . Colon cancer Neg Hx     Social History Social History  Substance Use Topics  . Smoking status: Never Smoker  . Smokeless tobacco: Never Used     Comment: tobacco use - no  . Alcohol use 0.0 oz/week     Comment: occasionally a glass of wine or margarita per pt     Allergies   Epinephrine and Clopidogrel bisulfate   Review of Systems Review of Systems  Constitutional: Negative for chills and fever.  Respiratory: Negative.  Negative for shortness of breath.   Cardiovascular: Negative.  Negative for chest pain.  Gastrointestinal: Positive for abdominal pain. Negative for constipation, diarrhea, nausea and vomiting.  Genitourinary: Negative for enuresis.  Musculoskeletal: Positive for back pain. Negative for myalgias.  Skin: Positive for rash.  Neurological: Negative.  Negative for weakness and numbness.     Physical Exam Updated Vital Signs BP 156/56 (BP Location: Left Arm)   Pulse 66   Temp 98.2 F (36.8 C) (Oral)   Resp 16   Ht 5\' 3"  (1.6 m)   Wt 72.6 kg   SpO2 99%   BMI 28.34 kg/m   Physical Exam  Constitutional: She appears well-developed and well-nourished.  Uncomfortable appearing.  Cardiovascular: Normal rate and regular rhythm.   No murmur heard. Pulmonary/Chest: Effort normal. She has no wheezes. She has no rales.  Abdominal: Soft. She exhibits no mass. There is no tenderness. There is no guarding.  Musculoskeletal: Normal range of motion. She exhibits no edema.  Full and equal strength in bilateral LE's. There is point tenderness in right paralumbar spine without swelling, redness or mass.   Neurological: She is alert.  Full and equal strength in bilateral LE's. No deficits of coordination. NVI LE's.   Skin:  There is a rash to right upper lumbar area extending to right mid abdomen that is scabbed, mildly erythematous, c/w shingles.     ED Treatments /  Results  Labs (all labs ordered are listed, but only abnormal results are displayed) Labs Reviewed - No data to display  EKG  EKG Interpretation None       Radiology No results found.  Procedures Procedures (including critical care time)  Medications Ordered in ED Medications - No data to display   Initial Impression / Assessment and Plan / ED Course  I have reviewed the triage vital signs and the nursing notes.  Pertinent labs & imaging results that were available during my care of the patient were reviewed by me and considered in my medical decision making (see chart for details).  Clinical Course    Patient with severe pain in right back extending to abdomen. Chart reviewed. CT scan of lumbar spine on 01/02/16 shows multilevel stenosis causing likely chronic pain. Feel the intensity of the pain she is having at this time is secondary to shingles. She is given Percocet and Valium and is able to rest. She reports pain is improved and reassessment.   She is seen and evaluated by Dr. Leonides Schanz and felt stable for discharge home.   Final Clinical Impressions(s) / ED Diagnoses   Final diagnoses:  None    New Prescriptions New Prescriptions   No medications on file     Charlann Lange, PA-C 01/09/16 [id number]

## 2016-01-09 MED ORDER — OXYCODONE-ACETAMINOPHEN 5-325 MG PO TABS: 1.0000 | ORAL_TABLET | ORAL | 0 refills | Status: DC | PRN

## 2016-01-09 MED ORDER — DIAZEPAM 2 MG PO TABS: 2.0000 mg | ORAL_TABLET | Freq: Four times a day (QID) | ORAL | 0 refills | Status: DC | PRN

## 2016-01-09 NOTE — ED Provider Notes (Signed)
Medical screening examination/treatment/procedure(s) were conducted as a shared visit with non-physician practitioner(s) and myself.  I personally evaluated the patient during the encounter.   EKG Interpretation None      Pt is a 82 y.o. female who presents to the emergency department with right mid abdomen pain that wraps around into her right flank. Recently had shingles and was on valacyclovir. Reports the lesions are getting better but pain is still there. Has been on hydrocodone without much relief. Also complaining of lower back pain and had a CT scan of her lumbar spine showing spinal stenosis. No new numbness, weakness, bowel or bladder incontinence, urinary retention, fever. Hemodynamically stable today, afebrile. Neurologically intact. Suspect most of her pain that she is pointing to which is from her right upper quadrant into her right flank is postherpetic pain. Pain has been well controlled with Percocet, Valium and the emergency department. We'll discharge with prescriptions for Percocet and have her follow-up with Dr.Luking as an outpatient. She is comfortable with this plan.   [place], DO 01/09/16 0045

## 2016-01-09 NOTE — Discharge Instructions (Signed)
STOP TAKING ROBAXIN (METHOCARBAMOL), BACLOFEN AND HYDROCODONE. TAKE MEDICATIONS PROVIDED HERE FOR PAIN FROM SHINGLES. RETURN TO THE EMERGENCY DEPARTMENT WITH ANY HIGH FEVER, UNCONTROLLED PAIN OR NEW CONCERN.

## 2016-01-10 ENCOUNTER — Encounter: Payer: Self-pay | Admitting: Family Medicine

## 2016-01-10 ENCOUNTER — Ambulatory Visit (INDEPENDENT_AMBULATORY_CARE_PROVIDER_SITE_OTHER): Payer: Medicare Other | Admitting: Family Medicine

## 2016-01-10 VITALS — BP 136/86 | Ht 63.0 in | Wt 163.6 lb

## 2016-01-10 DIAGNOSIS — I6523 Occlusion and stenosis of bilateral carotid arteries: Secondary | ICD-10-CM

## 2016-01-10 DIAGNOSIS — B029 Zoster without complications: Secondary | ICD-10-CM | POA: Diagnosis not present

## 2016-01-10 MED ORDER — OXYCODONE-ACETAMINOPHEN 5-325 MG PO TABS: ORAL_TABLET | ORAL | 0 refills | Status: DC

## 2016-01-10 MED ORDER — DIAZEPAM 2 MG PO TABS: 2.0000 mg | ORAL_TABLET | Freq: Every evening | ORAL | 0 refills | Status: DC | PRN

## 2016-01-10 NOTE — Progress Notes (Signed)
   Subjective:    Patient ID: Brenda Hall, female    DOB: 1931/04/19, 82 y.o.   MRN: [id number]  HPI  Patient  Arrives for follow up on shingles. Patient is experiencing sever back pain-was seen in ER earlier in week and given oxycodone and diazepam  Took hydrocod and dinot help much  perecocet 5 overall better  Notes improvent in pain  Took valium also for spasm  Pt notes severe pain  Review of Systems    Objective:   Physical Exam Alert vitals stable, NAD. Blood pressure good on repeat. HEENT normal. Lungs clear. Heart regular rate and rhythm. Positive clusters shingles right flank       Assessment & Plan:  Impression shingles ongoing pain. 2 early to call it post herpetic discussed still somatic pain from nerve inflammation. Plan oxycodone 5/325 one every 4 hours. Advair lacks when necessary. Try to use Valium only at night due to interactions and not at all possible

## 2016-01-16 ENCOUNTER — Encounter: Payer: Self-pay | Admitting: Family Medicine

## 2016-01-16 ENCOUNTER — Telehealth: Payer: Self-pay | Admitting: Family Medicine

## 2016-01-16 ENCOUNTER — Ambulatory Visit (INDEPENDENT_AMBULATORY_CARE_PROVIDER_SITE_OTHER): Payer: Medicare Other | Admitting: Family Medicine

## 2016-01-16 VITALS — BP 148/88 | Temp 97.7°F

## 2016-01-16 DIAGNOSIS — M792 Neuralgia and neuritis, unspecified: Secondary | ICD-10-CM | POA: Diagnosis not present

## 2016-01-16 DIAGNOSIS — B029 Zoster without complications: Secondary | ICD-10-CM | POA: Diagnosis not present

## 2016-01-16 DIAGNOSIS — I6523 Occlusion and stenosis of bilateral carotid arteries: Secondary | ICD-10-CM

## 2016-01-16 MED ORDER — HYDROCHLOROTHIAZIDE 25 MG PO TABS: ORAL_TABLET | ORAL | 0 refills | Status: DC

## 2016-01-16 MED ORDER — AMOXICILLIN 400 MG/5ML PO SUSR: ORAL | 0 refills | Status: DC

## 2016-01-16 MED ORDER — NORTRIPTYLINE HCL 25 MG PO CAPS: ORAL_CAPSULE | ORAL | 0 refills | Status: DC

## 2016-01-16 MED ORDER — POTASSIUM CHLORIDE CRYS ER 20 MEQ PO TBCR: 20.0000 meq | EXTENDED_RELEASE_TABLET | Freq: Every day | ORAL | 0 refills | Status: DC

## 2016-01-16 NOTE — Patient Instructions (Addendum)
Stop the valium or diazepam please  Start the night time nortryptiline  Stay on the pain med  We will consult home health at your family's request but I do not think you will qualify for recurrent visits  If you or your family thinks someone needs to stay  with you daily may want to contact [facility] on aging for sitters who can stay with you daily   Re check in one week

## 2016-01-16 NOTE — Telephone Encounter (Signed)
Patient has appointment today with Dr Richardson Landry and Dr Richardson Landry stated he will address this at office visit.

## 2016-01-16 NOTE — Progress Notes (Signed)
   Subjective:    Patient ID: Brenda Hall, female    DOB: December 08, 1930, 82 y.o.   MRN: [id number]  HPIrecheck shingles on right side. Pt having a lot of pain. Taking oxycodone and diazepam.Noting substantial drowsiness during the day   Swelling in ankles. Taking hctz 25mg . Needs refill in this regard no shortness of breath. No excessive salt intake.  Wants to have a home health nurse come out and check on her daily.This is actually per family request. Patient actually has family members living within a couple households, but some of them are working and states unable to check on her daily.    Review of Systems No headache, no major weight loss or weight gain, no chest pain no back pain abdominal pain no change in bowel habits complete ROS otherwise negative     Objective:   Physical Exam  Alert apparent distress. Vitals stable lungs clear heart rare rhythm shingles lesions have healed positive hypersensitive to touch in this region along the flank      Assessment & Plan:  Impression acute pain secondary to shingles #2 post shingles neuropathy a bit early to declare this patient certainly seems to be marching towards her with neuropathic features. Due to high frustration we'll press on with treatment #3 challenging social situation discussed with patient at length plan 25 minutes spent most in discussion. Encouraged to stop taking Valium, too much sedation without any substantial benefit pain medicine discuss and prescribed. And daily at bedtime nortriptyline. We will attempt to get home health referral addendum patient does not qualify for home health advised of such. Follow-up as scheduled

## 2016-01-16 NOTE — Telephone Encounter (Signed)
Pt is needing refills on her diazepam (VALIUM) 2 MG tablet   [facility]

## 2016-01-19 ENCOUNTER — Telehealth: Payer: Self-pay | Admitting: Family Medicine

## 2016-01-19 NOTE — Telephone Encounter (Signed)
Spoke with patient and informed her per [facility] Nurse  no longer does courtesy visit to let folks know they do not qualify, patient will not qualify because not strictly home bound and also not in need of a skilled nurse. Patient verbalized understanding and stated that she has someone come by in the evenings but she does not have anyone during the day. Patient has appointment on 01/23/16 and said that she would like to discuss it with Dr.Steve Luking.

## 2016-01-19 NOTE — Telephone Encounter (Signed)
Need OV for 01/16/16 completed with detailed reason for Home Health referral  Have to send face to face visit information with referral  Please advise

## 2016-01-19 NOTE — Telephone Encounter (Signed)
Call pt, h h no longer does courtesy vivist to let folks know they do not qualify, pt will not qualify because not strictly home bound and also not in need of a skiled nurse, is someone cking on her, has she consoidered gettign a home sitter to help her?

## 2016-01-23 ENCOUNTER — Encounter: Payer: Self-pay | Admitting: Family Medicine

## 2016-01-23 ENCOUNTER — Encounter: Payer: Self-pay | Admitting: Cardiology

## 2016-01-23 ENCOUNTER — Ambulatory Visit (INDEPENDENT_AMBULATORY_CARE_PROVIDER_SITE_OTHER): Payer: Medicare Other | Admitting: Cardiology

## 2016-01-23 ENCOUNTER — Ambulatory Visit (INDEPENDENT_AMBULATORY_CARE_PROVIDER_SITE_OTHER): Payer: Medicare Other | Admitting: Family Medicine

## 2016-01-23 VITALS — BP 122/80 | Ht 63.0 in | Wt 162.5 lb

## 2016-01-23 VITALS — BP 104/60 | HR 63 | Ht 63.0 in | Wt 162.0 lb

## 2016-01-23 DIAGNOSIS — I6523 Occlusion and stenosis of bilateral carotid arteries: Secondary | ICD-10-CM

## 2016-01-23 DIAGNOSIS — E782 Mixed hyperlipidemia: Secondary | ICD-10-CM | POA: Diagnosis not present

## 2016-01-23 DIAGNOSIS — R600 Localized edema: Secondary | ICD-10-CM | POA: Diagnosis not present

## 2016-01-23 DIAGNOSIS — M792 Neuralgia and neuritis, unspecified: Secondary | ICD-10-CM | POA: Diagnosis not present

## 2016-01-23 DIAGNOSIS — I2510 Atherosclerotic heart disease of native coronary artery without angina pectoris: Secondary | ICD-10-CM | POA: Diagnosis not present

## 2016-01-23 DIAGNOSIS — B0223 Postherpetic polyneuropathy: Secondary | ICD-10-CM

## 2016-01-23 NOTE — Patient Instructions (Signed)
Your physician wants you to follow-up in: 1 year Dr Branch You will receive a reminder letter in the mail two months in advance. If you don't receive a letter, please call our office to schedule the follow-up appointment.    Your physician recommends that you continue on your current medications as directed. Please refer to the Current Medication list given to you today.    If you need a refill on your cardiac medications before your next appointment, please call your pharmacy.    Thank you for choosing [facility] !         

## 2016-01-23 NOTE — Progress Notes (Signed)
Clinical Summary Brenda Hall is a 82 y.o.female seen today for follow up of the following medical problems.   1. CAD - prior NSTEMI 12/31/2005, she had a BMS placed to LCX. LVEF at that time 52% - echo 03/2008 LVEF normal, no LVEF given  - no recent chest pain, no SOB.    2. Hx of TIA - on aggrenox and statin for secondary prevention   3, Hyperlipidemia - compliant with atorvastatin,  - last panel 11/2015: TC 160 TG 102 HDL 82 LDL 58  4. HTN - compliant with meds  5. CKD - followed at [facility].   6. LE edema - on prn HCTZ, overall controlled   SH: former Chartered certified accountant x 25 years.    Past Medical History:  Diagnosis Date  . Anemia   . Cancer ([place]) 1988 and  2009   BCC nose and  Melanoma back  . Cancer of skin Jan. 2014   [facility] Forehead and Nose  . Carotid stenosis    mild  . Chest pain, unspecified   . CHF (congestive heart failure) ([place])   . Chronic back pain   . Coronary atherosclerosis of unspecified type of vessel, native or graft   . DJD (degenerative joint disease) of knee   . History of TIA (transient ischemic attack)   . HTN (hypertension)   . IBS (irritable bowel syndrome)   . Myocardial infarction    5 yrs ago     dr wall  cardiac  . Osteoporosis   . Other diseases of lung, not elsewhere classified    lung nodule  . Polymyalgia rheumatica ([place])   . Stroke [facility])      Allergies  Allergen Reactions  . Epinephrine Other (See Comments)    During dental procedure-caused facial swelling at injection site. Pt has had epinephrine since then for nose and facial surgery and did fine with it per pt.  . Clopidogrel Bisulfate Itching and Rash    Plavix     Current Outpatient Prescriptions  Medication Sig Dispense Refill  . aspirin 81 MG tablet Take 81 mg by mouth daily.    Marland Kitchen atorvastatin (LIPITOR) 80 MG tablet TAKE ONE (1) TABLET AT BEDTIME 90 tablet 1  . CALCIUM & MAGNESIUM CARBONATES PO Take by mouth.      . diphenoxylate-atropine (LOMOTIL) 2.5-0.025 MG per tablet Take 1 tablet by mouth 2 (two) times daily as needed for diarrhea or loose stools. 60 tablet 0  . dipyridamole-aspirin (AGGRENOX) 200-25 MG 12hr capsule Take 1 capsule by mouth 2 (two) times daily. 60 capsule 0  . fluticasone (FLONASE) 50 MCG/ACT nasal spray USE TWO SPRAYS IN EACH NOSTRIL ONCE DAILY 16 g 5  . hydrochlorothiazide (HYDRODIURIL) 25 MG tablet Take 1 tablet by mouth in the morning as needed for excess swelling. Do not exceed more than 3 tablet per 7 days. 30 tablet 0  . lisinopril (PRINIVIL,ZESTRIL) 10 MG tablet Take 1 tablet (10 mg total) by mouth daily. 90 tablet 1  . loperamide (IMODIUM) 2 MG capsule Take by mouth as needed for diarrhea or loose stools.    . metaxalone (SKELAXIN) 800 MG tablet Take 1 tablet (800 mg total) by mouth 3 (three) times daily. 30 tablet 3  . metoprolol tartrate (LOPRESSOR) 25 MG tablet TAKE 1 TABLET TWICE A DAY 180 tablet 2  . nitroGLYCERIN (NITROSTAT) 0.4 MG SL tablet Place 1 tablet (0.4 mg total) under the tongue every 5 (five) minutes as needed for chest  pain. 30 tablet 0  . nortriptyline (PAMELOR) 25 MG capsule Take one qhs for 3 days then two qhs 60 capsule 0  . oxyCODONE-acetaminophen (PERCOCET/ROXICET) 5-325 MG tablet One tablet every 4-6 hours as needed for pain 50 tablet 0  . pantoprazole (PROTONIX) 40 MG tablet Take 40 mg by mouth daily.     . potassium chloride SA (K-DUR,KLOR-CON) 20 MEQ tablet Take 1 tablet (20 mEq total) by mouth daily. 30 tablet 0  . PREMARIN 0.3 MG tablet TAKE 1 TABLET DAILY 90 tablet 3  . Zinc 50 MG CAPS Take 50 mg by mouth daily.     No current facility-administered medications for this visit.      Past Surgical History:  Procedure Laterality Date  . ABDOMINAL HYSTERECTOMY  1977   partial  . BACK SURGERY  1982  . CARDIAC CATHETERIZATION    . COLONOSCOPY N/A 01/27/2013   Procedure: COLONOSCOPY;  Surgeon: Rogene Houston, MD;  [facility];   Service: Endoscopy;  Laterality: N/A;  1030  . JOINT REPLACEMENT  2001 and  2011   6 knee surgeries   bil replacement  . KNEE ARTHROSCOPY    . LUMBAR LAMINECTOMY    . PR VEIN BYPASS GRAFT,AORTO-FEM-POP  2010  . removal of melanoma from back  2007  . SPINE SURGERY  2013   Broken back: fusion surgery  . tonisllectomy  1944  . total knee anthroplasty    . VERTEBROPLASTY  07/11/2011   Procedure: VERTEBROPLASTY;  Surgeon: Faythe Ghee, MD;  Location: [place] NEURO ORS;  Service: Neurosurgery;  Laterality: N/A;  Vertebroplasty of Lumbar Two     Allergies  Allergen Reactions  . Epinephrine Other (See Comments)    During dental procedure-caused facial swelling at injection site. Pt has had epinephrine since then for nose and facial surgery and did fine with it per pt.  . Clopidogrel Bisulfate Itching and Rash    Plavix      Family History  Problem Relation Age of Onset  . Heart disease Father   . Hypertension Father   . Heart attack Father   . Cancer Mother     bladder   . Hypertension Daughter   . Hypertension Son   . Colon cancer Neg Hx      Social History Brenda Hall reports that she has never smoked. She has never used smokeless tobacco. Brenda Hall reports that she drinks alcohol.   Review of Systems CONSTITUTIONAL: No weight loss, fever, chills, weakness or fatigue.  HEENT: Eyes: No visual loss, blurred vision, double vision or yellow sclerae.No hearing loss, sneezing, congestion, runny nose or sore throat.  SKIN: No rash or itching.  CARDIOVASCULAR: per hpi RESPIRATORY: No shortness of breath, cough or sputum.  GASTROINTESTINAL: No anorexia, nausea, vomiting or diarrhea. No abdominal pain or blood.  GENITOURINARY: No burning on urination, no polyuria NEUROLOGICAL: No headache, dizziness, syncope, paralysis, ataxia, numbness or tingling in the extremities. No change in bowel or bladder control.  MUSCULOSKELETAL: No muscle, back pain, joint pain or stiffness.    LYMPHATICS: No enlarged nodes. No history of splenectomy.  PSYCHIATRIC: No history of depression or anxiety.  ENDOCRINOLOGIC: No reports of sweating, cold or heat intolerance. No polyuria or polydipsia.  Marland Kitchen   Physical Examination Vitals:   01/23/16 1346  BP: 104/60  Pulse: 63   Vitals:   01/23/16 1346  Weight: 162 lb (73.5 kg)  Height: 5\' 3"  (1.6 m)    Gen: resting comfortably, no acute distress  HEENT: no scleral icterus, pupils equal round and reactive, no palptable cervical adenopathy,  CV: RRR, no m/r/g, no jvd Resp: Clear to auscultation bilaterally GI: abdomen is soft, non-tender, non-distended, normal bowel sounds, no hepatosplenomegaly MSK: extremities are warm, no edema.  Skin: warm, no rash Neuro:  no focal deficits Psych: appropriate affect   Diagnostic Studies 02/2013 Carotid US <40% bilateral ICA disease  03/2008 Echo SUMMARY - Overall left ventricular systolic function was normal. There were no left ventricular regional wall motion abnormalities. Left ventricular wall thickness was mildly increased. - There was trivial aortic valvular regurgitation. - The effective orifice of mitral regurgitation by proximal isovelocity surface area was 0.06 cm^2. The volume of mitral regurgitation by proximal isovelocity surface area was 9 cc. - The left atrium was mildly dilated. - The estimated peak right ventricular systolic pressure was within the upper limits of normal. - The right atrium was mildly dilated.    Assessment and Plan   1. CAD - no current symptoms, - continue current meds - EKG in clnic shows SR, no acute ischemic changes  2. Hx of TIA - continue aggrenox and statin for secondary prevention  3. Hyperlipidemia - at goal, she will continue high dose statin  4. LE edema - controlled with prn diuretics, continue  F/u 1 year     Arnoldo Lenis, M.D., F.A.C.C.

## 2016-01-23 NOTE — Progress Notes (Signed)
   Subjective:    Patient ID: Brenda Hall, female    DOB: [birth year], 82 y.o.   MRN: [id number]  HPI Patient is here today for a recheck on her shingles. Patient states that she is still having discomfort and pain from the rash.  Patient has no other concerns at this time.   Taking just two xycodones per day  Stools are better down to one nortryptiline ez night   eating bette  Hurting not quite as bad now  Overall improvement in pain. Down to just 2 pain tablets per day. Taking only one nortriptyline each night Review of Systems No headache, no major weight loss or weight gain, no chest pain no back pain abdominal pain no change in bowel habits complete ROS otherwise negative     Objective:   Physical Exam Alert vitals stable, NAD. Blood pressure good on repeat. HEENT normal. Lungs clear. Heart regular rate and rhythm. Shingles rash feeling well still with hypersensitivity       Assessment & Plan:  Lingering neuropathic pain from shingles plan continue nortriptyline patient just taking one each night. Uses oxycodone as needed down the 2 per day. Hopeful for ongoing improvement and resolution

## 2016-02-08 ENCOUNTER — Telehealth: Payer: Self-pay | Admitting: Family Medicine

## 2016-02-08 DIAGNOSIS — R5383 Other fatigue: Secondary | ICD-10-CM

## 2016-02-08 NOTE — Telephone Encounter (Signed)
Discussed with pt to stop potassium and dr Richardson Landry will review message tomorrow.

## 2016-02-08 NOTE — Telephone Encounter (Signed)
#  1 stop the potassium #2 let patient know Dr. Richardson Landry will see this message on Friday thank you

## 2016-02-08 NOTE — Telephone Encounter (Signed)
Patient called stating that she is now over shingles but she currently still has potassium left and she wants to know if she should continue it. Patient states that she is no longer taking the HCTZ. Also patient states that she is experiencing problems with her sense of taste and she is unable to eat food due to only being able to taste sweet foods. She also c/o fatigue. Please advise?

## 2016-02-08 NOTE — Telephone Encounter (Signed)
Left message to return call 

## 2016-02-09 NOTE — Telephone Encounter (Signed)
Spoke with patient and informed her per Dr.Steve Luking- We are ordering met 7 and cbc and tsh due to fatigue and to answer question regarding low Potassium, not unuaual to feel tired for a long time after a difficult event like shingles neuropathy. Patient verbalized understanding.

## 2016-02-09 NOTE — Telephone Encounter (Signed)
Ck met 7 and cbc and tsh due to fatigue and toaanswer question regarding low Pot, not unuaual to feel tired for a long time after a difficult event like shingles neuropathy 

## 2016-02-09 NOTE — Addendum Note (Signed)
Addended by: Ofilia Neas R on: 02/09/2016 11:05 AM   Modules accepted: Orders

## 2016-02-10 DIAGNOSIS — R5383 Other fatigue: Secondary | ICD-10-CM | POA: Diagnosis not present

## 2016-02-11 LAB — CBC WITH DIFFERENTIAL/PLATELET
BASOS: 1 %
Basophils Absolute: 0.1 10*3/uL (ref 0.0–0.2)
EOS (ABSOLUTE): 0.1 10*3/uL (ref 0.0–0.4)
Eos: 1 %
Hematocrit: 36.1 % (ref 34.0–46.6)
Hemoglobin: 12.2 g/dL (ref 11.1–15.9)
IMMATURE GRANULOCYTES: 0 %
Immature Grans (Abs): 0 10*3/uL (ref 0.0–0.1)
Lymphocytes Absolute: 2.1 10*3/uL (ref 0.7–3.1)
Lymphs: 31 %
MCH: 32.6 pg (ref 26.6–33.0)
MCHC: 33.8 g/dL (ref 31.5–35.7)
MCV: 97 fL (ref 79–97)
MONOS ABS: 0.5 10*3/uL (ref 0.1–0.9)
Monocytes: 7 %
NEUTROS PCT: 60 %
Neutrophils Absolute: 4.1 10*3/uL (ref 1.4–7.0)
PLATELETS: 283 10*3/uL (ref 150–379)
RBC: 3.74 x10E6/uL — ABNORMAL LOW (ref 3.77–5.28)
RDW: 13.8 % (ref 12.3–15.4)
WBC: 6.8 10*3/uL (ref 3.4–10.8)

## 2016-02-11 LAB — BASIC METABOLIC PANEL
BUN / CREAT RATIO: 23 (ref 12–28)
BUN: 29 mg/dL — ABNORMAL HIGH (ref 8–27)
CALCIUM: 9.5 mg/dL (ref 8.7–10.3)
CHLORIDE: 101 mmol/L (ref 96–106)
CO2: 23 mmol/L (ref 18–29)
Creatinine, Ser: 1.24 mg/dL — ABNORMAL HIGH (ref 0.57–1.00)
GFR calc non Af Amer: 40 mL/min/{1.73_m2} — ABNORMAL LOW (ref >59)
GFR, EST AFRICAN AMERICAN: 46 mL/min/{1.73_m2} — AB (ref >59)
Glucose: 96 mg/dL (ref 65–99)
POTASSIUM: 4.5 mmol/L (ref 3.5–5.2)
Sodium: 141 mmol/L (ref 134–144)

## 2016-02-11 LAB — TSH: TSH: 0.763 u[IU]/mL (ref 0.450–4.500)

## 2016-04-05 ENCOUNTER — Telehealth (HOSPITAL_COMMUNITY): Payer: Self-pay | Admitting: *Deleted

## 2016-04-05 NOTE — Telephone Encounter (Signed)
Patient is happy to reschedule carotid surveillance to q5.  Annual surveillance appointment rescheduled for 4 yours.

## 2016-04-09 ENCOUNTER — Encounter: Payer: Self-pay | Admitting: Family Medicine

## 2016-04-09 ENCOUNTER — Ambulatory Visit (INDEPENDENT_AMBULATORY_CARE_PROVIDER_SITE_OTHER): Payer: Medicare Other | Admitting: Family Medicine

## 2016-04-09 VITALS — BP 112/74 | Temp 98.4°F | Wt 165.0 lb

## 2016-04-09 DIAGNOSIS — B9789 Other viral agents as the cause of diseases classified elsewhere: Secondary | ICD-10-CM | POA: Diagnosis not present

## 2016-04-09 DIAGNOSIS — J069 Acute upper respiratory infection, unspecified: Secondary | ICD-10-CM

## 2016-04-09 DIAGNOSIS — I6523 Occlusion and stenosis of bilateral carotid arteries: Secondary | ICD-10-CM | POA: Diagnosis not present

## 2016-04-09 DIAGNOSIS — B9689 Other specified bacterial agents as the cause of diseases classified elsewhere: Secondary | ICD-10-CM

## 2016-04-09 DIAGNOSIS — J019 Acute sinusitis, unspecified: Secondary | ICD-10-CM | POA: Diagnosis not present

## 2016-04-09 MED ORDER — AZITHROMYCIN 250 MG PO TABS: ORAL_TABLET | ORAL | 0 refills | Status: DC

## 2016-04-09 NOTE — Progress Notes (Signed)
   Subjective:    Patient ID: Brenda Hall, female    DOB: 1930/05/15, 82 y.o.   MRN: [id number]  Sinusitis  This is a new problem. Episode onset: 2 days. Associated symptoms include congestion, coughing, headaches and a sore throat. Pertinent negatives include no ear pain or shortness of breath. (Diarrhea, fever) Treatments tried: tylenol sinus, cough med    Patient with multiple days of sinus pressure drainage coughing hoarseness in the past couple days denies wheezing difficulty breathing no vomiting or diarrhea   Review of Systems  Constitutional: Negative for activity change and fever.  HENT: Positive for congestion, rhinorrhea and sore throat. Negative for ear pain.   Eyes: Negative for discharge.  Respiratory: Positive for cough. Negative for shortness of breath and wheezing.   Cardiovascular: Negative for chest pain.  Neurological: Positive for headaches.       Objective:   Physical Exam  Constitutional: She appears well-developed.  HENT:  Head: Normocephalic.  Nose: Nose normal.  Mouth/Throat: Oropharynx is clear and moist. No oropharyngeal exudate.  Neck: Neck supple.  Cardiovascular: Normal rate and normal heart sounds.   No murmur heard. Pulmonary/Chest: Effort normal and breath sounds normal. She has no wheezes.  Lymphadenopathy:    She has no cervical adenopathy.  Skin: Skin is warm and dry.  Nursing note and vitals reviewed.    Patient does not appear toxic. I do not find any evidence of pneumonia     Assessment & Plan:  Patient was seen today for upper respiratory illness. It is felt that the patient is dealing with sinusitis. Antibiotics were prescribed today. Importance of compliance with medication was discussed. Symptoms should gradually resolve over the course of the next several days. If high fevers, progressive illness, difficulty breathing, worsening condition or failure for symptoms to improve over the next several days then the patient is to  follow-up. If any emergent conditions the patient is to follow-up in the emergency department otherwise to follow-up in the office.  viral illness with secondary sinusitis antibiotics prescribed waning sign discuse

## 2016-04-24 DIAGNOSIS — L570 Actinic keratosis: Secondary | ICD-10-CM | POA: Diagnosis not present

## 2016-04-24 DIAGNOSIS — Z85820 Personal history of malignant melanoma of skin: Secondary | ICD-10-CM | POA: Diagnosis not present

## 2016-04-24 DIAGNOSIS — D229 Melanocytic nevi, unspecified: Secondary | ICD-10-CM | POA: Diagnosis not present

## 2016-04-24 DIAGNOSIS — L821 Other seborrheic keratosis: Secondary | ICD-10-CM | POA: Diagnosis not present

## 2016-05-03 ENCOUNTER — Ambulatory Visit: Payer: Medicare Other | Admitting: Family

## 2016-05-03 ENCOUNTER — Encounter (HOSPITAL_COMMUNITY): Payer: Medicare Other

## 2016-05-07 ENCOUNTER — Telehealth: Payer: Self-pay | Admitting: Family Medicine

## 2016-05-07 NOTE — Telephone Encounter (Signed)
Left message to return call 

## 2016-05-07 NOTE — Telephone Encounter (Signed)
Patient stopped in today to see if its necessary she gets a flu shot?  She said she has been sick since December, but now she is just having a cough, hurting in her chest and the sniffles.  She would like to know Dr. Jeannine Kitten recommendation.

## 2016-05-07 NOTE — Telephone Encounter (Signed)
She should have flu shot every yr ideally in sept or October, and yes can still get one

## 2016-05-10 NOTE — Telephone Encounter (Signed)
Patient advised She should have flu shot every yr ideally in sept or October, and yes can still get one. Patient verbalized understanding.

## 2016-06-04 ENCOUNTER — Ambulatory Visit (INDEPENDENT_AMBULATORY_CARE_PROVIDER_SITE_OTHER): Payer: Medicare Other | Admitting: *Deleted

## 2016-06-04 DIAGNOSIS — Z23 Encounter for immunization: Secondary | ICD-10-CM | POA: Diagnosis not present

## 2016-06-06 ENCOUNTER — Ambulatory Visit (INDEPENDENT_AMBULATORY_CARE_PROVIDER_SITE_OTHER): Payer: Medicare Other | Admitting: Family Medicine

## 2016-06-06 ENCOUNTER — Encounter: Payer: Self-pay | Admitting: Family Medicine

## 2016-06-06 VITALS — BP 116/74 | Ht 63.0 in | Wt 162.0 lb

## 2016-06-06 DIAGNOSIS — B9689 Other specified bacterial agents as the cause of diseases classified elsewhere: Secondary | ICD-10-CM

## 2016-06-06 DIAGNOSIS — Z79899 Other long term (current) drug therapy: Secondary | ICD-10-CM | POA: Diagnosis not present

## 2016-06-06 DIAGNOSIS — J019 Acute sinusitis, unspecified: Secondary | ICD-10-CM | POA: Diagnosis not present

## 2016-06-06 DIAGNOSIS — I10 Essential (primary) hypertension: Secondary | ICD-10-CM | POA: Diagnosis not present

## 2016-06-06 DIAGNOSIS — M792 Neuralgia and neuritis, unspecified: Secondary | ICD-10-CM

## 2016-06-06 DIAGNOSIS — E785 Hyperlipidemia, unspecified: Secondary | ICD-10-CM

## 2016-06-06 DIAGNOSIS — I878 Other specified disorders of veins: Secondary | ICD-10-CM

## 2016-06-06 MED ORDER — FLUTICASONE PROPIONATE 50 MCG/ACT NA SUSP: NASAL | 5 refills | Status: DC

## 2016-06-06 MED ORDER — CEPHALEXIN 500 MG PO CAPS: 500.0000 mg | ORAL_CAPSULE | Freq: Three times a day (TID) | ORAL | 0 refills | Status: DC

## 2016-06-06 NOTE — Progress Notes (Signed)
   Subjective:    Patient ID: Brenda Hall, female    DOB: Feb 14, 1931, 83 y.o.   MRN: [id number]  Hypertension  This is a chronic problem. The current episode started more than 1 year ago. There are no compliance problems.    Blood pressure medicine and blood pressure levels reviewed today with patient. Compliant with blood pressure medicine. States does not miss a dose. No obvious side effects. Blood pressure generally good when checked elsewhere. Watching salt intake.   Patient continues to take lipid medication regularly. No obvious side effects from it. Generally does not miss a dose. Prior blood work results are reviewed with patient. Patient continues to work on fat intake in diet  Just got a flu shot, tolerated o k  Cough nd congestion and drange and stuffy, dim enregy   Slight selling both hands and feets, mild in nature, takes only as needed for bd swelling  Swelling in feet and hands.   Claims compliance with neds   Pt states can not exdrcise,  Doing water exr three times per wk      Review of Systems No headache, no major weight loss or weight gain, no chest pain no back pain abdominal pain no change in bowel habits complete ROS otherwise negative     Objective:   Physical Exam  Alert vital stable HEENT mild nasal congestion frontal tenderness pharynx slight erythema neck supple lungs clear heart rare rhythm ankles trace edema at most      Assessment & Plan:  Impression 1 rhinosinusitis discussed management discussed treatment discuss #2 hypertension good control discussed maintain same as #3 hyperlipidemia prior blood work reviewed discussed maintain same meds #4 venous stasis. Do not feel need for major peripheral edema workup rationale discussed with patient use HCTZ when necessary appropriate blood work. Medications refilled. Antibiotics prescribed. Diet exercise discussed WSL

## 2016-06-07 DIAGNOSIS — I10 Essential (primary) hypertension: Secondary | ICD-10-CM | POA: Diagnosis not present

## 2016-06-07 DIAGNOSIS — Z79899 Other long term (current) drug therapy: Secondary | ICD-10-CM | POA: Diagnosis not present

## 2016-06-07 DIAGNOSIS — E785 Hyperlipidemia, unspecified: Secondary | ICD-10-CM | POA: Diagnosis not present

## 2016-06-08 LAB — BASIC METABOLIC PANEL
BUN / CREAT RATIO: 14 (ref 12–28)
BUN: 18 mg/dL (ref 8–27)
CALCIUM: 9.6 mg/dL (ref 8.7–10.3)
CHLORIDE: 104 mmol/L (ref 96–106)
CO2: 25 mmol/L (ref 18–29)
CREATININE: 1.25 mg/dL — AB (ref 0.57–1.00)
GFR calc Af Amer: 46 mL/min/{1.73_m2} — ABNORMAL LOW (ref >59)
GFR calc non Af Amer: 40 mL/min/{1.73_m2} — ABNORMAL LOW (ref >59)
GLUCOSE: 95 mg/dL (ref 65–99)
Potassium: 4.5 mmol/L (ref 3.5–5.2)
Sodium: 147 mmol/L — ABNORMAL HIGH (ref 134–144)

## 2016-06-08 LAB — LIPID PANEL
CHOL/HDL RATIO: 2.2 ratio (ref 0.0–4.4)
Cholesterol, Total: 172 mg/dL (ref 100–199)
HDL: 78 mg/dL (ref >39)
LDL CALC: 68 mg/dL (ref 0–99)
TRIGLYCERIDES: 128 mg/dL (ref 0–149)
VLDL Cholesterol Cal: 26 mg/dL (ref 5–40)

## 2016-06-08 LAB — HEPATIC FUNCTION PANEL
ALT: 10 IU/L (ref 0–32)
AST: 15 IU/L (ref 0–40)
Albumin: 4 g/dL (ref 3.5–4.7)
Alkaline Phosphatase: 58 IU/L (ref 39–117)
BILIRUBIN, DIRECT: 0.14 mg/dL (ref 0.00–0.40)
Bilirubin Total: 0.4 mg/dL (ref 0.0–1.2)
TOTAL PROTEIN: 6.7 g/dL (ref 6.0–8.5)

## 2016-06-12 ENCOUNTER — Encounter: Payer: Self-pay | Admitting: Family Medicine

## 2016-06-19 DIAGNOSIS — H5203 Hypermetropia, bilateral: Secondary | ICD-10-CM | POA: Diagnosis not present

## 2016-06-19 DIAGNOSIS — H2513 Age-related nuclear cataract, bilateral: Secondary | ICD-10-CM | POA: Diagnosis not present

## 2016-06-19 DIAGNOSIS — H524 Presbyopia: Secondary | ICD-10-CM | POA: Diagnosis not present

## 2016-06-19 DIAGNOSIS — H52203 Unspecified astigmatism, bilateral: Secondary | ICD-10-CM | POA: Diagnosis not present

## 2016-07-11 DIAGNOSIS — Z96653 Presence of artificial knee joint, bilateral: Secondary | ICD-10-CM | POA: Insufficient documentation

## 2016-07-11 DIAGNOSIS — Z8781 Personal history of (healed) traumatic fracture: Secondary | ICD-10-CM | POA: Insufficient documentation

## 2016-07-11 DIAGNOSIS — Z8709 Personal history of other diseases of the respiratory system: Secondary | ICD-10-CM | POA: Insufficient documentation

## 2016-07-11 DIAGNOSIS — Z8719 Personal history of other diseases of the digestive system: Secondary | ICD-10-CM | POA: Insufficient documentation

## 2016-07-11 DIAGNOSIS — C4491 Basal cell carcinoma of skin, unspecified: Secondary | ICD-10-CM | POA: Insufficient documentation

## 2016-07-11 DIAGNOSIS — E559 Vitamin D deficiency, unspecified: Secondary | ICD-10-CM | POA: Insufficient documentation

## 2016-07-11 DIAGNOSIS — M810 Age-related osteoporosis without current pathological fracture: Secondary | ICD-10-CM | POA: Insufficient documentation

## 2016-07-11 DIAGNOSIS — Z8673 Personal history of transient ischemic attack (TIA), and cerebral infarction without residual deficits: Secondary | ICD-10-CM | POA: Insufficient documentation

## 2016-07-11 DIAGNOSIS — M19042 Primary osteoarthritis, left hand: Secondary | ICD-10-CM

## 2016-07-11 DIAGNOSIS — M19072 Primary osteoarthritis, left ankle and foot: Secondary | ICD-10-CM

## 2016-07-11 DIAGNOSIS — M19041 Primary osteoarthritis, right hand: Secondary | ICD-10-CM | POA: Insufficient documentation

## 2016-07-11 DIAGNOSIS — M19071 Primary osteoarthritis, right ankle and foot: Secondary | ICD-10-CM | POA: Insufficient documentation

## 2016-07-11 DIAGNOSIS — M47816 Spondylosis without myelopathy or radiculopathy, lumbar region: Secondary | ICD-10-CM | POA: Insufficient documentation

## 2016-07-11 NOTE — Progress Notes (Signed)
Office Visit Note  Patient: Brenda Hall             Date of Birth: 02-Mar-1931           MRN: [id number]             PCP: Mickie Hillier, MD Referring: [name], MD Visit Date: 07/23/2016 Occupation: @GUAROCC @    Subjective:  Pain hands.   History of Present Illness: Brenda Hall is a 83 y.o. female with history of polymyalgia rheumatica and osteoarthritis. She states in October 2017 she developed shingles on her back which was very painful. She was seen in the emergency room and was not given any treatment. The shingles eventually resolved and the symptoms have gone now. She is doing fairly well overall now. She is very active and has been doing water aerobics on regular basis. She had been cooking last taken for Easter. She has some discomfort in her hands and has some pedal edema from prolonged standing. She reports some discomfort in her left knee joint which is replaced.  Activities of Daily Living:  Patient reports morning stiffness for0 minute.   Patient Denies nocturnal pain.  Difficulty dressing/grooming: Denies Difficulty climbing stairs: Denies Difficulty getting out of chair: Denies Difficulty using hands for taps, buttons, cutlery, and/or writing: Reports   Review of Systems  Constitutional: Negative for fatigue, night sweats, weight gain, weight loss and weakness.  HENT: Positive for mouth dryness. Negative for mouth sores, trouble swallowing, trouble swallowing and nose dryness.   Eyes: Negative for pain, redness, visual disturbance and dryness.  Respiratory: Negative for cough, shortness of breath and difficulty breathing.   Cardiovascular: Negative for chest pain, palpitations, hypertension, irregular heartbeat and swelling in legs/feet.  Gastrointestinal: Negative for blood in stool, constipation and diarrhea.  Endocrine: Negative for increased urination.  Genitourinary: Negative for vaginal dryness.  Musculoskeletal: Positive for  arthralgias and joint pain. Negative for joint swelling, myalgias, muscle weakness, morning stiffness, muscle tenderness and myalgias.  Skin: Negative for color change, rash, hair loss, skin tightness, ulcers and sensitivity to sunlight.  Allergic/Immunologic: Negative for susceptible to infections.  Neurological: Negative for dizziness, memory loss and night sweats.  Hematological: Negative for swollen glands.  Psychiatric/Behavioral: Negative for depressed mood and sleep disturbance. The patient is not nervous/anxious.     PMFS History:  Patient Active Problem List   Diagnosis Date Noted  . Osteopenia of multiple sites 07/23/2016  . Age-related osteoporosis without current pathological fracture 07/11/2016  . History of total knee replacement, bilateral 07/11/2016  . History of COPD 07/11/2016  . Spondylosis of lumbar region without myelopathy or radiculopathy 07/11/2016  . Primary osteoarthritis of both hands 07/11/2016  . Primary osteoarthritis of both feet 07/11/2016  . Vitamin D deficiency 07/11/2016  . History of vertebral fracture 07/11/2016  . History of IBS 07/11/2016  . Basal cell carcinoma 07/11/2016  . History of CVA (cerebrovascular accident)/ has metal implant not MRI safe  07/11/2016  . Other and unspecified hyperlipidemia 11/19/2012  . Cerebrovascular accident (stroke) ([place]) 11/19/2012  . Difficulty in walking(719.7) 11/06/2012  . Occlusion and stenosis of carotid artery without mention of cerebral infarction 02/07/2012  . OLD MYOCARDIAL INFARCTION 11/07/2009  . CAD, NATIVE VESSEL 11/07/2009  . Essential hypertension 09/28/2008  . IRRITABLE BOWEL SYNDROME 09/28/2008  . DEGENERATIVE JOINT DISEASE, KNEE 09/28/2008  . POLYMYALGIA RHEUMATICA 09/28/2008  . CHEST PAIN UNSPECIFIED 09/28/2008    Past Medical History:  Diagnosis Date  . Anemia   . Cancer (  [place]) 1988 and  2009   BCC nose and  Melanoma back  . Cancer of skin Jan. 2014   [facility]head and Nose  . Carotid  stenosis    mild  . Chest pain, unspecified   . CHF (congestive heart failure) ([place])   . Chronic back pain   . Coronary atherosclerosis of unspecified type of vessel, native or graft   . DJD (degenerative joint disease) of knee   . History of TIA (transient ischemic attack)   . HTN (hypertension)   . IBS (irritable bowel syndrome)   . Myocardial infarction    5 yrs ago     dr wall  cardiac  . Osteoporosis   . Other diseases of lung, not elsewhere classified    lung nodule  . Polymyalgia rheumatica ([place])   . Shingles 01/2016-02/2016  . Stroke [facility])     Family History  Problem Relation Age of Onset  . Heart disease Father   . Hypertension Father   . Heart attack Father   . Cancer Mother     bladder   . Hypertension Daughter   . Hypertension Son   . Colon cancer Neg Hx    Past Surgical History:  Procedure Laterality Date  . ABDOMINAL HYSTERECTOMY  1977   partial  . BACK SURGERY  1982  . CARDIAC CATHETERIZATION    . COLONOSCOPY N/A 01/27/2013   Procedure: COLONOSCOPY;  Surgeon: Rogene Houston, MD;  Location: [facility];  Service: Endoscopy;  Laterality: N/A;  1030  . JOINT REPLACEMENT  2001 and  2011   6 knee surgeries   bil replacement  . KNEE ARTHROSCOPY    . LUMBAR LAMINECTOMY    . PR VEIN BYPASS GRAFT,AORTO-FEM-POP  2010  . removal of melanoma from back  2007  . SPINE SURGERY  2013   Broken back: fusion surgery  . tonisllectomy  1944  . total knee anthroplasty    . VERTEBROPLASTY  07/11/2011   Procedure: VERTEBROPLASTY;  Surgeon: Faythe Ghee, MD;  Location: [facility]URO ORS;  Service: Neurosurgery;  Laterality: N/A;  Vertebroplasty of Lumbar Two   Social History   Social History Narrative   Married, retired. 3 children. Regular exercise -yes; hobbies = swimming.     Objective: Vital Signs: BP (!) 174/80   Pulse 67   Resp 13   Ht 5\' 3"  (1.6 m)   Wt 164 lb (74.4 kg)   BMI 29.05 kg/m    Physical Exam  Constitutional: She is oriented to person, place,  and time. She appears well-developed and well-nourished.  HENT:  Head: Normocephalic and atraumatic.  Eyes: Conjunctivae and EOM are normal.  Neck: Normal range of motion.  Cardiovascular: Normal rate, regular rhythm, normal heart sounds and intact distal pulses.   Pulmonary/Chest: Effort normal and breath sounds normal.  Abdominal: Soft. Bowel sounds are normal.  Lymphadenopathy:    She has no cervical adenopathy.  Neurological: She is alert and oriented to person, place, and time.  Skin: Skin is warm and dry. Capillary refill takes less than 2 seconds.  Psychiatric: She has a normal mood and affect. Her behavior is normal.  Nursing note and vitals reviewed.    Musculoskeletal Exam: C-spine good range of motion she has thoracic kyphosis. Lumbar spine good range of motion. Shoulder joints elbow joints are good range of motion. She has no MCP swelling or thickening. There is PIP/DIP thickening consistent with osteoarthritis with no synovitis. Hip joints are good range of motion.  She has bilateral total knee replacement with some warmth in her left knee joint. She has osteoarthritic changes in her feet with overcrowding of toes.  CDAI Exam: No CDAI exam completed.    Investigation: Findings:  Osteopenia.  DEXA done May 23, 2015.  T score -1.6 12/28/2015  L-spine x-rays, 2 view, showed scoliosis.  There is a cement noted at L2-L3.  There is no new compression fracture. 06/07/2016 CMP creatinine 1.25 GFR 40, sodium 147     Imaging: No results found.  Speciality Comments: No specialty comments available.    Procedures:  No procedures performed Allergies: Epinephrine; Clopidogrel bisulfate; and Oxycodone   Assessment / Plan:     Visit Diagnoses: POLYMYALGIA RHEUMATICA: Appears to be in remission without any muscle weakness or tenderness.  Primary osteoarthritis of both hands: Joint protection and muscle strengthening discussed.  History of total knee replacement, bilateral:  She is been having some discomfort in her left knee joint. I have advised her to schedule an appointment with orthopedics to follow-up on left total knee replacement.  Primary osteoarthritis of both feet: Proper fitting shoes were discussed.  Spondylosis of lumbar region without myelopathy or radiculopathy: Stretching exercises were discussed.  History of osteopenia: Use of calcium and vitamin D was discussed.  History of vertebral fracture: She is doing well currently.  Vitamin D deficiency: She is on supplement.  History of COPD  History of IBS  History of CVA (cerebrovascular accident)/ has metal implant not MRI safe     Orders: No orders of the defined types were placed in this encounter.  No orders of the defined types were placed in this encounter.   Face-to-face time spent with patient was 30 minutes. 50% of time was spent in counseling and coordination of care.  Follow-Up Instructions: Return in about 1 year (around 07/23/2017) for Osteoarthritis, Osteoporosis.   Bo Merino, MD  Note - This record has been created using Editor, commissioning.  Chart creation errors have been sought, but may not always  have been located. Such creation errors do not reflect on  the standard of medical care.

## 2016-07-12 ENCOUNTER — Other Ambulatory Visit: Payer: Self-pay | Admitting: Family Medicine

## 2016-07-12 ENCOUNTER — Telehealth: Payer: Self-pay | Admitting: Family Medicine

## 2016-07-12 MED ORDER — ATORVASTATIN CALCIUM 80 MG PO TABS: ORAL_TABLET | ORAL | 0 refills | Status: DC

## 2016-07-12 NOTE — Telephone Encounter (Signed)
Prescription sent electronically to pharmacy. Patient notified. 

## 2016-07-12 NOTE — Telephone Encounter (Signed)
Pt is needing a temporary 30 day supply of her atorvastatin. Pt is needing this sent into [facility] pharmacy.

## 2016-07-23 ENCOUNTER — Encounter: Payer: Self-pay | Admitting: Rheumatology

## 2016-07-23 ENCOUNTER — Ambulatory Visit (INDEPENDENT_AMBULATORY_CARE_PROVIDER_SITE_OTHER): Payer: Medicare Other | Admitting: Rheumatology

## 2016-07-23 VITALS — BP 174/80 | HR 67 | Resp 13 | Ht 63.0 in | Wt 164.0 lb

## 2016-07-23 DIAGNOSIS — M19041 Primary osteoarthritis, right hand: Secondary | ICD-10-CM

## 2016-07-23 DIAGNOSIS — Z8709 Personal history of other diseases of the respiratory system: Secondary | ICD-10-CM | POA: Diagnosis not present

## 2016-07-23 DIAGNOSIS — M19042 Primary osteoarthritis, left hand: Secondary | ICD-10-CM

## 2016-07-23 DIAGNOSIS — M8589 Other specified disorders of bone density and structure, multiple sites: Secondary | ICD-10-CM

## 2016-07-23 DIAGNOSIS — M19072 Primary osteoarthritis, left ankle and foot: Secondary | ICD-10-CM

## 2016-07-23 DIAGNOSIS — Z96653 Presence of artificial knee joint, bilateral: Secondary | ICD-10-CM | POA: Diagnosis not present

## 2016-07-23 DIAGNOSIS — Z8781 Personal history of (healed) traumatic fracture: Secondary | ICD-10-CM | POA: Diagnosis not present

## 2016-07-23 DIAGNOSIS — M47816 Spondylosis without myelopathy or radiculopathy, lumbar region: Secondary | ICD-10-CM | POA: Diagnosis not present

## 2016-07-23 DIAGNOSIS — M353 Polymyalgia rheumatica: Secondary | ICD-10-CM

## 2016-07-23 DIAGNOSIS — M19071 Primary osteoarthritis, right ankle and foot: Secondary | ICD-10-CM

## 2016-07-23 DIAGNOSIS — Z8719 Personal history of other diseases of the digestive system: Secondary | ICD-10-CM | POA: Diagnosis not present

## 2016-07-23 DIAGNOSIS — E559 Vitamin D deficiency, unspecified: Secondary | ICD-10-CM

## 2016-07-23 DIAGNOSIS — Z8673 Personal history of transient ischemic attack (TIA), and cerebral infarction without residual deficits: Secondary | ICD-10-CM

## 2016-07-28 ENCOUNTER — Other Ambulatory Visit: Payer: Self-pay | Admitting: Rheumatology

## 2016-07-29 NOTE — Telephone Encounter (Signed)
Last Visit: 07/23/16 Next Visit: 07/23/17  Okay to refill Pantoprazole?

## 2016-07-29 NOTE — Telephone Encounter (Signed)
ok 

## 2016-08-02 ENCOUNTER — Telehealth: Payer: Self-pay | Admitting: *Deleted

## 2016-08-02 NOTE — Telephone Encounter (Signed)
If she continues to have reflux symptoms she can either continue pantoprazole or may switch to Zantac over-the-counter

## 2016-08-02 NOTE — Telephone Encounter (Signed)
Patient contacted the office stating she just picked up her prescription of Pantoprazole and she was was looking at the information packet and it states that you shouldn't take longer than 3 years. Patient states she has been on it for three years. Patient would like to know if she should discontinue this medication.

## 2016-08-05 NOTE — Telephone Encounter (Signed)
Attempted to contact the patient and left message for patient to call the office.  

## 2016-08-06 NOTE — Telephone Encounter (Signed)
Attempted to contact the patient and left message for patient to call the office.  

## 2016-08-08 NOTE — Telephone Encounter (Signed)
Patient of recommendations and verbalized understanding.

## 2016-08-15 ENCOUNTER — Other Ambulatory Visit: Payer: Self-pay | Admitting: *Deleted

## 2016-08-15 ENCOUNTER — Telehealth: Payer: Self-pay | Admitting: Family Medicine

## 2016-08-15 MED ORDER — ATORVASTATIN CALCIUM 80 MG PO TABS: ORAL_TABLET | ORAL | 0 refills | Status: DC

## 2016-08-15 NOTE — Telephone Encounter (Signed)
Refill sent.

## 2016-08-15 NOTE — Telephone Encounter (Signed)
Patient dropped off a form from [place] (during lunch) for a refill on her atorvastatin (LIPITOR) 80 MG tablet. She said she was almost out of medicine.  Needs the form faxed to [facility].  Form at nurses station.

## 2016-08-25 ENCOUNTER — Other Ambulatory Visit: Payer: Self-pay | Admitting: Cardiology

## 2016-09-09 ENCOUNTER — Other Ambulatory Visit: Payer: Self-pay | Admitting: Family Medicine

## 2016-10-17 ENCOUNTER — Encounter: Payer: Self-pay | Admitting: Rheumatology

## 2016-10-17 DIAGNOSIS — Z1231 Encounter for screening mammogram for malignant neoplasm of breast: Secondary | ICD-10-CM | POA: Diagnosis not present

## 2016-10-17 DIAGNOSIS — Z803 Family history of malignant neoplasm of breast: Secondary | ICD-10-CM | POA: Diagnosis not present

## 2016-10-18 ENCOUNTER — Telehealth: Payer: Self-pay | Admitting: Family Medicine

## 2016-10-18 NOTE — Telephone Encounter (Signed)
Review screening mammogram results faxed over from [place].

## 2016-10-21 NOTE — Telephone Encounter (Signed)
Normal

## 2016-11-05 ENCOUNTER — Other Ambulatory Visit: Payer: Self-pay | Admitting: Family Medicine

## 2016-11-05 DIAGNOSIS — D229 Melanocytic nevi, unspecified: Secondary | ICD-10-CM | POA: Diagnosis not present

## 2016-11-05 DIAGNOSIS — L821 Other seborrheic keratosis: Secondary | ICD-10-CM | POA: Diagnosis not present

## 2016-11-05 DIAGNOSIS — L728 Other follicular cysts of the skin and subcutaneous tissue: Secondary | ICD-10-CM | POA: Diagnosis not present

## 2016-11-12 ENCOUNTER — Other Ambulatory Visit: Payer: Self-pay | Admitting: *Deleted

## 2016-11-12 MED ORDER — PANTOPRAZOLE SODIUM 40 MG PO TBEC: 40.0000 mg | DELAYED_RELEASE_TABLET | Freq: Every morning | ORAL | 1 refills | Status: DC

## 2016-11-12 NOTE — Telephone Encounter (Signed)
Refill request received via fax.  Last Visit: 07/23/16 Next Visit: 07/23/17  Okay to refill per Dr. Rob Hickman

## 2016-12-03 ENCOUNTER — Encounter: Payer: Self-pay | Admitting: Family Medicine

## 2016-12-03 ENCOUNTER — Ambulatory Visit (INDEPENDENT_AMBULATORY_CARE_PROVIDER_SITE_OTHER): Payer: Medicare Other | Admitting: Family Medicine

## 2016-12-03 VITALS — BP 144/80 | Ht 63.0 in | Wt 160.5 lb

## 2016-12-03 DIAGNOSIS — E785 Hyperlipidemia, unspecified: Secondary | ICD-10-CM | POA: Diagnosis not present

## 2016-12-03 DIAGNOSIS — I10 Essential (primary) hypertension: Secondary | ICD-10-CM

## 2016-12-03 DIAGNOSIS — I252 Old myocardial infarction: Secondary | ICD-10-CM | POA: Diagnosis not present

## 2016-12-03 DIAGNOSIS — R079 Chest pain, unspecified: Secondary | ICD-10-CM

## 2016-12-03 MED ORDER — NITROGLYCERIN 0.4 MG SL SUBL: 0.4000 mg | SUBLINGUAL_TABLET | SUBLINGUAL | 0 refills | Status: DC | PRN

## 2016-12-03 MED ORDER — ESTROGENS CONJUGATED 0.3 MG PO TABS: 0.3000 mg | ORAL_TABLET | Freq: Every day | ORAL | 3 refills | Status: DC

## 2016-12-03 MED ORDER — LISINOPRIL 10 MG PO TABS: 10.0000 mg | ORAL_TABLET | Freq: Every day | ORAL | 1 refills | Status: DC

## 2016-12-03 MED ORDER — ATORVASTATIN CALCIUM 80 MG PO TABS: 80.0000 mg | ORAL_TABLET | Freq: Every day | ORAL | 1 refills | Status: DC

## 2016-12-03 MED ORDER — FLUTICASONE PROPIONATE 50 MCG/ACT NA SUSP: NASAL | 5 refills | Status: DC

## 2016-12-03 NOTE — Progress Notes (Signed)
   Subjective:    Patient ID: Brenda Hall, female    DOB: 1931-04-22, 83 y.o.   MRN: [id number]  Hypertension  This is a chronic problem. The current episode started more than 1 year ago.   Patient has concerns of nagging pain periodically to chest.   A little aching in the chest , mild, about a month ago,can occur with exertion or with rest, subsides with deep breaths , off and on, Pain is sometimes sharp sometimes taking primarily left-sided  Blood pressure medicine and blood pressure levels reviewed today with patient. Compliant with blood pressure medicine. States does not miss a dose. No obvious side effects. Blood pressure generally good when checked elsewhere. Watching salt intake.   Patient continues to take lipid medication regularly. No obvious side effects from it. Generally does not miss a dose. Prior blood work results are reviewed with patient. Patient continues to work on fat intake in diet  [place], and does not want to see any heart doc til after then   Also has concerns of stye to right eye.   Review of Systems No headache, no major weight loss or weight gain, no chest pain no back pain abdominal pain no change in bowel habits complete ROS otherwise negative     Objective:   Physical Exam Alert and oriented, vitals reviewed and stable, NAD ENT-TM's and ext canals WNL bilat via otoscopic exam Soft palate, tonsils and post pharynx WNL via oropharyngeal exam Neck-symmetric, no masses; thyroid nonpalpable and nontender Pulmonary-no tachypnea or accessory muscle use; Clear without wheezes via auscultation Card--no abnrml murmurs, rhythm reg and rate WNL Carotid pulses symmetric, without bruits, Slight tenderness to deep palpation left lateral chest Nonspecific stye right lower eyelid Normal sinus rhythm, nonspecific ST-T changes on EKG. Evidence of old MI       Assessment & Plan:  Impression 1 hypertension good control discussed  maintain same meds #2 hyperlipidemia current status uncertain to draw blood work old results discussed maintain same for now #3 stye I discussed need to see eye doctor unless patient chooses #4 chest pain definitely atypical however with patient's background urinary artery disease good idea to touch base with her cardiologist discussed will help set up follow-up in 6 months diet exercise discussed medications refilled

## 2016-12-04 DIAGNOSIS — I10 Essential (primary) hypertension: Secondary | ICD-10-CM | POA: Diagnosis not present

## 2016-12-04 DIAGNOSIS — R079 Chest pain, unspecified: Secondary | ICD-10-CM | POA: Diagnosis not present

## 2016-12-04 DIAGNOSIS — I252 Old myocardial infarction: Secondary | ICD-10-CM | POA: Diagnosis not present

## 2016-12-05 ENCOUNTER — Encounter: Payer: Self-pay | Admitting: Family Medicine

## 2016-12-05 LAB — BASIC METABOLIC PANEL
BUN/Creatinine Ratio: 21 (ref 12–28)
BUN: 29 mg/dL — ABNORMAL HIGH (ref 8–27)
CALCIUM: 10.2 mg/dL (ref 8.7–10.3)
CO2: 24 mmol/L (ref 20–29)
Chloride: 103 mmol/L (ref 96–106)
Creatinine, Ser: 1.38 mg/dL — ABNORMAL HIGH (ref 0.57–1.00)
GFR calc Af Amer: 41 mL/min/{1.73_m2} — ABNORMAL LOW (ref >59)
GFR, EST NON AFRICAN AMERICAN: 35 mL/min/{1.73_m2} — AB (ref >59)
Glucose: 100 mg/dL — ABNORMAL HIGH (ref 65–99)
Potassium: 5.5 mmol/L — ABNORMAL HIGH (ref 3.5–5.2)
SODIUM: 141 mmol/L (ref 134–144)

## 2016-12-05 LAB — HEPATIC FUNCTION PANEL
ALBUMIN: 4.1 g/dL (ref 3.5–4.7)
ALT: 17 IU/L (ref 0–32)
AST: 28 IU/L (ref 0–40)
Alkaline Phosphatase: 56 IU/L (ref 39–117)
Bilirubin Total: 0.5 mg/dL (ref 0.0–1.2)
Bilirubin, Direct: 0.15 mg/dL (ref 0.00–0.40)
TOTAL PROTEIN: 7.3 g/dL (ref 6.0–8.5)

## 2016-12-05 LAB — LIPID PANEL
Chol/HDL Ratio: 2.2 ratio (ref 0.0–4.4)
Cholesterol, Total: 177 mg/dL (ref 100–199)
HDL: 80 mg/dL (ref >39)
LDL CALC: 66 mg/dL (ref 0–99)
Triglycerides: 155 mg/dL — ABNORMAL HIGH (ref 0–149)
VLDL CHOLESTEROL CAL: 31 mg/dL (ref 5–40)

## 2016-12-06 ENCOUNTER — Encounter: Payer: Self-pay | Admitting: Family Medicine

## 2016-12-06 NOTE — Addendum Note (Signed)
Addended by: Karle Barr on: 12/06/2016 10:41 AM   Modules accepted: Orders

## 2016-12-11 ENCOUNTER — Other Ambulatory Visit: Payer: Self-pay | Admitting: *Deleted

## 2016-12-11 ENCOUNTER — Telehealth: Payer: Self-pay | Admitting: *Deleted

## 2016-12-11 DIAGNOSIS — E875 Hyperkalemia: Secondary | ICD-10-CM

## 2016-12-11 NOTE — Telephone Encounter (Signed)
Pt states she saw kidney specialist about one year ago. And was told if she had vomiting to stop lisinopril  2 epidsodes of Vomiting 2 days ago. Felt fine since. Pt states she is not going to take lisinopril for one month and wanted to let you know she is not taking it. Pt states she has not checked bp but she will check today.

## 2016-12-11 NOTE — Telephone Encounter (Signed)
Brenda Hall, ok,( wish she'd work with her kid dr on this if kid dr said that , no need to tell pt that)

## 2016-12-11 NOTE — Telephone Encounter (Signed)
Spoke with patient and informed her that Dr.Steve said ok. Patient verbalized understanding.

## 2016-12-19 ENCOUNTER — Encounter: Payer: Self-pay | Admitting: Adult Health

## 2016-12-19 ENCOUNTER — Ambulatory Visit (INDEPENDENT_AMBULATORY_CARE_PROVIDER_SITE_OTHER): Payer: Medicare Other | Admitting: Adult Health

## 2016-12-19 VITALS — BP 140/70 | HR 61 | Ht 63.0 in | Wt 161.0 lb

## 2016-12-19 DIAGNOSIS — E7800 Pure hypercholesterolemia, unspecified: Secondary | ICD-10-CM | POA: Diagnosis not present

## 2016-12-19 DIAGNOSIS — I10 Essential (primary) hypertension: Secondary | ICD-10-CM | POA: Diagnosis not present

## 2016-12-19 DIAGNOSIS — I2510 Atherosclerotic heart disease of native coronary artery without angina pectoris: Secondary | ICD-10-CM

## 2016-12-19 NOTE — Progress Notes (Signed)
Cardiology Office Note   Date:  12/19/2016   ID:  Cristabel Kalika, Smay 1930/11/20, MRN [id number]  PCP:  [name], MD  Cardiologist:  [facility] Chief Complaint  Patient presents with  . Coronary Artery Disease  . Hypertension  . Hospitalization Follow-up     History of Present Illness: Neira H Nix is a 83 y.o. female who presents for ongoing assessment and management of coronary artery disease, bare-metal stents to the circumflex in 2007, most recent echocardiogram in 2009 with normal LV systolic function. Other history includes TIA, hyperlipidemia, hypertension, chronic kidney disease, and chronic lower extremity edema.  She was last seen by Dr. Harl Bowie on 01/23/2016, she was asymptomatic, she was continued on her current medication regimen. She was to follow-up in one year.  She is here early at the request of Dr. looking due to hyperkalemia. She had been on lisinopril which was discontinued. The patient is also been very restrictive in her diet concerning potassium rich foods that she has become quite concerned about this. She remains active taking aerobic classes in a pool 3 times a week, along with staying active at home. She is medically compliant. She denies any chest pain. She has chronic back pain and knee pain.  Past Medical History:  Diagnosis Date  . Anemia   . Cancer ([place]) 1988 and  2009   BCC nose and  Melanoma back  . Cancer of skin Jan. 2014   [facility] Forehead and Nose  . Carotid stenosis    mild  . Chest pain, unspecified   . CHF (congestive heart failure) ([place])   . Chronic back pain   . Coronary atherosclerosis of unspecified type of vessel, native or graft   . DJD (degenerative joint disease) of knee   . History of TIA (transient ischemic attack)   . HTN (hypertension)   . IBS (irritable bowel syndrome)   . Myocardial infarction [facility])    5 yrs ago     dr wall  cardiac  . Osteoporosis   . Other diseases of lung, not elsewhere classified    lung  nodule  . Polymyalgia rheumatica ([place])   . Shingles 01/2016-02/2016  . Stroke [facility])     Past Surgical History:  Procedure Laterality Date  . ABDOMINAL HYSTERECTOMY  1977   partial  . BACK SURGERY  1982  . CARDIAC CATHETERIZATION    . COLONOSCOPY N/A 01/27/2013   Procedure: COLONOSCOPY;  Surgeon: Rogene Houston, MD;  [facility];  Service: Endoscopy;  Laterality: N/A;  1030  . JOINT REPLACEMENT  2001 and  2011   6 knee surgeries   bil replacement  . KNEE ARTHROSCOPY    . LUMBAR LAMINECTOMY    . PR VEIN BYPASS GRAFT,AORTO-FEM-POP  2010  . removal of melanoma from back  2007  . SPINE SURGERY  2013   Broken back: fusion surgery  . tonisllectomy  1944  . total knee anthroplasty    . VERTEBROPLASTY  07/11/2011   Procedure: VERTEBROPLASTY;  Surgeon: Faythe Ghee, MD;  Location: [facility];  Service: Neurosurgery;  Laterality: N/A;  Vertebroplasty of Lumbar Two     Current Outpatient Prescriptions  Medication Sig Dispense Refill  . aspirin 81 MG tablet Take 81 mg by mouth daily.    Marland Kitchen atorvastatin (LIPITOR) 80 MG tablet Take 1 tablet (80 mg total) by mouth at bedtime. 90 tablet 1  . Calcium Carbonate-Vit D-Min (CALCIUM 1200 PO) Take by mouth.    Marland Kitchen  dipyridamole-aspirin (AGGRENOX) 200-25 MG 12hr capsule Take 1 capsule by mouth 2 (two) times daily. 60 capsule 0  . estrogens, conjugated, (PREMARIN) 0.3 MG tablet Take 1 tablet (0.3 mg total) by mouth daily. Take daily for 21 days then do not take for 7 days. 90 tablet 3  . fluticasone (FLONASE) 50 MCG/ACT nasal spray USE TWO SPRAYS IN EACH NOSTRIL ONCE DAILY 16 g 5  . loperamide (IMODIUM) 2 MG capsule Take by mouth as needed for diarrhea or loose stools.    . metoprolol tartrate (LOPRESSOR) 25 MG tablet TAKE 1 TABLET TWICE A DAY 180 tablet 2  . nitroGLYCERIN (NITROSTAT) 0.4 MG SL tablet Place 1 tablet (0.4 mg total) under the tongue every 5 (five) minutes as needed for chest pain. 30 tablet 0  . pantoprazole (PROTONIX) 40 MG  tablet Take 1 tablet (40 mg total) by mouth every morning. 90 tablet 1  . Zinc 50 MG CAPS Take 50 mg by mouth daily.     No current facility-administered medications for this visit.     Allergies:   Epinephrine; Clopidogrel bisulfate; and Oxycodone    Social History:  The patient  reports that she has never smoked. She has never used smokeless tobacco. She reports that she drinks alcohol. She reports that she does not use drugs.   Family History:  The patient's family history includes Cancer in her mother; Heart attack in her father; Heart disease in her father; Hypertension in her daughter, father, and son.    ROS: All other systems are reviewed and negative. Unless otherwise mentioned in H&P    PHYSICAL EXAM: VS:  BP 140/70   Pulse 61   Ht 5\' 3"  (1.6 m)   Wt 161 lb (73 kg)   SpO2 95%   BMI 28.52 kg/m  , BMI Body mass index is 28.52 kg/m. GEN: Well nourished, well developed, in no acute distress  HEENT: normal  Neck: no JVD, carotid bruits, or masses Cardiac: RRR; no murmurs, rubs, or gallops,no edema  Respiratory:  clear to auscultation bilaterally, normal work of breathing GI: soft, nontender, nondistended, + BS MS: no deformity or atrophy  Skin: warm and dry, no rash Neuro:  Strength and sensation are intact Psych: euthymic mood, full affect   Recent Labs: 02/10/2016: Hemoglobin 12.2; Platelets 283; TSH 0.763 12/04/2016: ALT 17; BUN 29; Creatinine, Ser 1.38; Potassium 5.5; Sodium 141    Lipid Panel    Component Value Date/Time   CHOL 177 12/04/2016 0939   TRIG 155 (H) 12/04/2016 0939   HDL 80 12/04/2016 0939   CHOLHDL 2.2 12/04/2016 0939   CHOLHDL 2.3 12/14/2013 0910   VLDL 20 12/14/2013 0910   LDLCALC 66 12/04/2016 0939      Wt Readings from Last 3 Encounters:  12/19/16 161 lb (73 kg)  12/03/16 160 lb 8 oz (72.8 kg)  07/23/16 164 lb (74.4 kg)      Other studies Reviewed: 02/2013 Carotid US <40% bilateral ICA disease  03/2008 Echo SUMMARY -  Overall left ventricular systolic function was normal. There were no left ventricular regional wall motion abnormalities. Left ventricular wall thickness was mildly increased. - There was trivial aortic valvular regurgitation. - The effective orifice of mitral regurgitation by proximal isovelocity surface area was 0.06 cm^2. The volume of mitral regurgitation by proximal isovelocity surface area was 9 cc. - The left atrium was mildly dilated. - The estimated peak right ventricular systolic pressure was within the upper limits of normal. - The right atrium was  mildly dilated.  ASSESSMENT AND PLAN:  1. Coronary artery disease: History of bare mental stent to the circumflex. She denies recurrent chest discomfort, increased dyspnea, or fatigue. She is medically compliant. Will not make any changes at this time in medication regimen. She will continue metoprolol, statin therapy, and aspirin.  2. Hyperkalemia: Lisinopril was discontinued by primary care in this setting. The patient is going to have repeat labs completed in 2 weeks. Uncertain reason for hyperkalemia other than possible ACE inhibitor etiology, versus hemolysis. Patient's creatinine was 1.38, up from 1.25 in February 2018. Will not add any medications or adjustments at this time. Await follow-up labs. Agree with stopping ACE.  3. Hypertension: Moderately elevated on this office visit 140/70. She takes her blood pressure at home and states it's running 170 over 80s and 90s. I asked her if she abraded her blood pressure machine with the physician's offices, and she said that her blood pressure machine is often recording higher levels. If it is necessary to add medication to keep blood pressure better controlled would recommend amlodipine. For now however I would not add any new medications unless we saw persistently elevated blood pressure.   Current medicines are reviewed at length with the patient today.    Labs/ tests ordered today  include:  Phill Myron. West Pugh, ANP, AACC   12/19/2016 3:28 PM    [facility] [address], [place], [place] [address] Phone: [phone]; Fax: [phone]

## 2016-12-19 NOTE — Patient Instructions (Signed)
Medication Instructions:  Your physician recommends that you continue on your current medications as directed. Please refer to the Current Medication list given to you today.   Labwork: NONE   Testing/Procedures: NONE   Follow-Up: Your physician recommends that you schedule a follow-up appointment with Dr. Branch    Any Other Special Instructions Will Be Listed Below (If Applicable).     If you need a refill on your cardiac medications before your next appointment, please call your pharmacy.  Thank you for choosing [facility]!   

## 2017-01-14 ENCOUNTER — Other Ambulatory Visit: Payer: Self-pay | Admitting: Family Medicine

## 2017-01-14 ENCOUNTER — Ambulatory Visit (INDEPENDENT_AMBULATORY_CARE_PROVIDER_SITE_OTHER): Payer: Medicare Other | Admitting: *Deleted

## 2017-01-14 DIAGNOSIS — E875 Hyperkalemia: Secondary | ICD-10-CM | POA: Diagnosis not present

## 2017-01-14 DIAGNOSIS — Z23 Encounter for immunization: Secondary | ICD-10-CM | POA: Diagnosis not present

## 2017-01-15 LAB — BASIC METABOLIC PANEL
BUN/Creatinine Ratio: 14 (ref 12–28)
BUN: 19 mg/dL (ref 8–27)
CALCIUM: 9.5 mg/dL (ref 8.7–10.3)
CHLORIDE: 105 mmol/L (ref 96–106)
CO2: 25 mmol/L (ref 20–29)
CREATININE: 1.32 mg/dL — AB (ref 0.57–1.00)
GFR calc Af Amer: 43 mL/min/{1.73_m2} — ABNORMAL LOW (ref >59)
GFR, EST NON AFRICAN AMERICAN: 37 mL/min/{1.73_m2} — AB (ref >59)
Glucose: 95 mg/dL (ref 65–99)
POTASSIUM: 4.5 mmol/L (ref 3.5–5.2)
Sodium: 144 mmol/L (ref 134–144)

## 2017-01-24 ENCOUNTER — Ambulatory Visit (INDEPENDENT_AMBULATORY_CARE_PROVIDER_SITE_OTHER): Payer: Medicare Other | Admitting: Cardiology

## 2017-01-24 ENCOUNTER — Encounter: Payer: Self-pay | Admitting: Cardiology

## 2017-01-24 VITALS — BP 140/68 | HR 67 | Ht 63.0 in | Wt 161.0 lb

## 2017-01-24 DIAGNOSIS — I2510 Atherosclerotic heart disease of native coronary artery without angina pectoris: Secondary | ICD-10-CM

## 2017-01-24 DIAGNOSIS — E782 Mixed hyperlipidemia: Secondary | ICD-10-CM | POA: Diagnosis not present

## 2017-01-24 DIAGNOSIS — I10 Essential (primary) hypertension: Secondary | ICD-10-CM

## 2017-01-24 NOTE — Progress Notes (Signed)
Clinical Summary Brenda Hall is a 83 y.o.female seen today for follow up of the following medical problems.   1. CAD - prior NSTEMI 12/31/2005, she had a BMS placed to LCX. LVEF at that time 52% - echo 03/2008 LVEF normal, no LVEF given   - lisinopril stopped due to hyperkalemia recently.  - she denies any chest pain/sob/doe.   2. Hx of TIA - on aggrenox and statin for secondary prevention   3, Hyperlipidemia - last panel 11/2015: TC 160 TG 102 HDL 82 LDL 58p - she remains compliant with statin   4. HTN - she is compliant with meds  5. CKD - followed at [facility].     SH: former Chartered certified accountant x 25 years.    Past Medical History:  Diagnosis Date  . Anemia   . Cancer ([place]) 1988 and  2009   BCC nose and  Melanoma back  . Cancer of skin Jan. 2014   [facility] Forehead and Nose  . Carotid stenosis    mild  . Chest pain, unspecified   . CHF (congestive heart failure) ([place])   . Chronic back pain   . Coronary atherosclerosis of unspecified type of vessel, native or graft   . DJD (degenerative joint disease) of knee   . History of TIA (transient ischemic attack)   . HTN (hypertension)   . IBS (irritable bowel syndrome)   . Myocardial infarction [facility])    5 yrs ago     dr wall  cardiac  . Osteoporosis   . Other diseases of lung, not elsewhere classified    lung nodule  . Polymyalgia rheumatica ([place])   . Shingles 01/2016-02/2016  . Stroke [facility])      Allergies  Allergen Reactions  . Epinephrine Other (See Comments)    During dental procedure-caused facial swelling at injection site. Pt has had epinephrine since then for nose and facial surgery and did fine with it per pt.  . Clopidogrel Bisulfate Itching and Rash    Plavix  . Oxycodone     hallucinations     Current Outpatient Prescriptions  Medication Sig Dispense Refill  . aspirin 81 MG tablet Take 81 mg by mouth daily.    Marland Kitchen atorvastatin (LIPITOR) 80 MG tablet  Take 1 tablet (80 mg total) by mouth at bedtime. 90 tablet 1  . Calcium Carbonate-Vit D-Min (CALCIUM 1200 PO) Take by mouth.    . dipyridamole-aspirin (AGGRENOX) 200-25 MG 12hr capsule Take 1 capsule by mouth 2 (two) times daily. 60 capsule 0  . estrogens, conjugated, (PREMARIN) 0.3 MG tablet Take 1 tablet (0.3 mg total) by mouth daily. Take daily for 21 days then do not take for 7 days. 90 tablet 3  . fluticasone (FLONASE) 50 MCG/ACT nasal spray USE TWO SPRAYS IN EACH NOSTRIL ONCE DAILY 16 g 5  . loperamide (IMODIUM) 2 MG capsule Take by mouth as needed for diarrhea or loose stools.    . metoprolol tartrate (LOPRESSOR) 25 MG tablet TAKE 1 TABLET TWICE A DAY 180 tablet 2  . nitroGLYCERIN (NITROSTAT) 0.4 MG SL tablet Place 1 tablet (0.4 mg total) under the tongue every 5 (five) minutes as needed for chest pain. 30 tablet 0  . pantoprazole (PROTONIX) 40 MG tablet Take 1 tablet (40 mg total) by mouth every morning. 90 tablet 1  . Zinc 50 MG CAPS Take 50 mg by mouth daily.     No current facility-administered medications for this visit.  Past Surgical History:  Procedure Laterality Date  . ABDOMINAL HYSTERECTOMY  1977   partial  . BACK SURGERY  1982  . CARDIAC CATHETERIZATION    . COLONOSCOPY N/A 01/27/2013   Procedure: COLONOSCOPY;  Surgeon: Rogene Houston, MD;  [facility];  Service: Endoscopy;  Laterality: N/A;  1030  . JOINT REPLACEMENT  2001 and  2011   6 knee surgeries   bil replacement  . KNEE ARTHROSCOPY    . LUMBAR LAMINECTOMY    . PR VEIN BYPASS GRAFT,AORTO-FEM-POP  2010  . removal of melanoma from back  2007  . SPINE SURGERY  2013   Broken back: fusion surgery  . tonisllectomy  1944  . total knee anthroplasty    . VERTEBROPLASTY  07/11/2011   Procedure: VERTEBROPLASTY;  Surgeon: Faythe Ghee, MD;  Location: [facility];  Service: Neurosurgery;  Laterality: N/A;  Vertebroplasty of Lumbar Two     Allergies  Allergen Reactions  . Epinephrine Other (See  Comments)    During dental procedure-caused facial swelling at injection site. Pt has had epinephrine since then for nose and facial surgery and did fine with it per pt.  . Clopidogrel Bisulfate Itching and Rash    Plavix  . Oxycodone     hallucinations      Family History  Problem Relation Age of Onset  . Heart disease Father   . Hypertension Father   . Heart attack Father   . Cancer Mother        bladder   . Hypertension Daughter   . Hypertension Son   . Colon cancer Neg Hx      Social History Brenda Hall reports that she has never smoked. She has never used smokeless tobacco. Brenda Hall reports that she drinks alcohol.   Review of Systems CONSTITUTIONAL: No weight loss, fever, chills, weakness or fatigue.  HEENT: Eyes: No visual loss, blurred vision, double vision or yellow sclerae.No hearing loss, sneezing, congestion, runny nose or sore throat.  SKIN: No rash or itching.  CARDIOVASCULAR: per hpi RESPIRATORY: per hpi GASTROINTESTINAL: No anorexia, nausea, vomiting or diarrhea. No abdominal pain or blood.  GENITOURINARY: No burning on urination, no polyuria NEUROLOGICAL: No headache, dizziness, syncope, paralysis, ataxia, numbness or tingling in the extremities. No change in bowel or bladder control.  MUSCULOSKELETAL: No muscle, back pain, joint pain or stiffness.  LYMPHATICS: No enlarged nodes. No history of splenectomy.  PSYCHIATRIC: No history of depression or anxiety.  ENDOCRINOLOGIC: No reports of sweating, cold or heat intolerance. No polyuria or polydipsia.  Marland Kitchen   Physical Examination Vitals:   01/24/17 1108  BP: 140/68  Pulse: 67  SpO2: 97%   Vitals:   01/24/17 1108  Weight: 161 lb (73 kg)  Height: 5\' 3"  (1.6 m)    Gen: resting comfortably, no acute distress HEENT: no scleral icterus, pupils equal round and reactive, no palptable cervical adenopathy,  CV: RRR, no m/r/g, no jvd Resp: Clear to auscultation bilaterally GI: abdomen is soft,  non-tender, non-distended, normal bowel sounds, no hepatosplenomegaly MSK: extremities are warm, no edema.  Skin: warm, no rash Neuro:  no focal deficits Psych: appropriate affect   Diagnostic Studies 02/2013 Carotid US <40% bilateral ICA disease  03/2008 Echo SUMMARY - Overall left ventricular systolic function was normal. There were no left ventricular regional wall motion abnormalities. Left ventricular wall thickness was mildly increased. - There was trivial aortic valvular regurgitation. - The effective orifice of mitral regurgitation by proximal isovelocity surface area  was 0.06 cm^2. The volume of mitral regurgitation by proximal isovelocity surface area was 9 cc. - The left atrium was mildly dilated. - The estimated peak right ventricular systolic pressure was within the upper limits of normal. - The right atrium was mildly dilated.    Assessment and Plan  1. CAD - no recent symptoms, continue current meds   2. Hyperlipidemia - continue statin, we will repeat lipid panel  3. HTN - ACE-I stopped recently due to high potassium. May consider trial of low dose at f/u, she had been on 10mg  daily. Significant benefit related to her CAD and prior TIA history for ACE-Is     Arnoldo Lenis, M.D.

## 2017-01-24 NOTE — Patient Instructions (Signed)
Medication Instructions:  Your physician recommends that you continue on your current medications as directed. Please refer to the Current Medication list given to you today.   Labwork: NONE  Testing/Procedures: NONE  Follow-Up: Your physician recommends that you schedule a follow-up appointment in: 4 MONTHS    Any Other Special Instructions Will Be Listed Below (If Applicable).     If you need a refill on your cardiac medications before your next appointment, please call your pharmacy.   

## 2017-02-11 ENCOUNTER — Telehealth: Payer: Self-pay | Admitting: Family Medicine

## 2017-02-11 NOTE — Telephone Encounter (Signed)
ok 

## 2017-02-11 NOTE — Telephone Encounter (Signed)
Patient has an appointment with [facility] this Thursday and just wanted to let Dr. Richardson Landry know.

## 2017-02-12 ENCOUNTER — Emergency Department (HOSPITAL_COMMUNITY): Payer: Medicare Other

## 2017-02-12 ENCOUNTER — Encounter (HOSPITAL_COMMUNITY): Payer: Self-pay | Admitting: *Deleted

## 2017-02-12 ENCOUNTER — Inpatient Hospital Stay (HOSPITAL_COMMUNITY)
Admission: EM | Admit: 2017-02-12 | Discharge: 2017-02-14 | DRG: 308 | Disposition: A | Discharge status: Home / Self Care | Payer: Medicare Other | Attending: Family Medicine | Admitting: Family Medicine

## 2017-02-12 DIAGNOSIS — R531 Weakness: Secondary | ICD-10-CM | POA: Diagnosis not present

## 2017-02-12 DIAGNOSIS — R3915 Urgency of urination: Secondary | ICD-10-CM | POA: Diagnosis present

## 2017-02-12 DIAGNOSIS — E785 Hyperlipidemia, unspecified: Secondary | ICD-10-CM | POA: Diagnosis present

## 2017-02-12 DIAGNOSIS — I130 Hypertensive heart and chronic kidney disease with heart failure and stage 1 through stage 4 chronic kidney disease, or unspecified chronic kidney disease: Secondary | ICD-10-CM | POA: Diagnosis present

## 2017-02-12 DIAGNOSIS — I2510 Atherosclerotic heart disease of native coronary artery without angina pectoris: Secondary | ICD-10-CM | POA: Diagnosis present

## 2017-02-12 DIAGNOSIS — J811 Chronic pulmonary edema: Secondary | ICD-10-CM

## 2017-02-12 DIAGNOSIS — Z981 Arthrodesis status: Secondary | ICD-10-CM

## 2017-02-12 DIAGNOSIS — Z85828 Personal history of other malignant neoplasm of skin: Secondary | ICD-10-CM

## 2017-02-12 DIAGNOSIS — I110 Hypertensive heart disease with heart failure: Secondary | ICD-10-CM | POA: Diagnosis not present

## 2017-02-12 DIAGNOSIS — M810 Age-related osteoporosis without current pathological fracture: Secondary | ICD-10-CM | POA: Diagnosis present

## 2017-02-12 DIAGNOSIS — Z8673 Personal history of transient ischemic attack (TIA), and cerebral infarction without residual deficits: Secondary | ICD-10-CM | POA: Diagnosis not present

## 2017-02-12 DIAGNOSIS — I5031 Acute diastolic (congestive) heart failure: Secondary | ICD-10-CM | POA: Diagnosis present

## 2017-02-12 DIAGNOSIS — I4891 Unspecified atrial fibrillation: Secondary | ICD-10-CM | POA: Diagnosis not present

## 2017-02-12 DIAGNOSIS — M353 Polymyalgia rheumatica: Secondary | ICD-10-CM | POA: Diagnosis present

## 2017-02-12 DIAGNOSIS — I509 Heart failure, unspecified: Secondary | ICD-10-CM

## 2017-02-12 DIAGNOSIS — Z7982 Long term (current) use of aspirin: Secondary | ICD-10-CM | POA: Diagnosis not present

## 2017-02-12 DIAGNOSIS — N183 Chronic kidney disease, stage 3 (moderate): Secondary | ICD-10-CM | POA: Diagnosis present

## 2017-02-12 DIAGNOSIS — Z79899 Other long term (current) drug therapy: Secondary | ICD-10-CM | POA: Diagnosis not present

## 2017-02-12 DIAGNOSIS — I482 Chronic atrial fibrillation: Principal | ICD-10-CM | POA: Diagnosis present

## 2017-02-12 DIAGNOSIS — R079 Chest pain, unspecified: Secondary | ICD-10-CM | POA: Diagnosis not present

## 2017-02-12 DIAGNOSIS — I25118 Atherosclerotic heart disease of native coronary artery with other forms of angina pectoris: Secondary | ICD-10-CM | POA: Diagnosis not present

## 2017-02-12 DIAGNOSIS — Z85820 Personal history of malignant melanoma of skin: Secondary | ICD-10-CM

## 2017-02-12 DIAGNOSIS — R0789 Other chest pain: Secondary | ICD-10-CM | POA: Diagnosis not present

## 2017-02-12 DIAGNOSIS — J810 Acute pulmonary edema: Secondary | ICD-10-CM | POA: Diagnosis not present

## 2017-02-12 DIAGNOSIS — I248 Other forms of acute ischemic heart disease: Secondary | ICD-10-CM | POA: Diagnosis present

## 2017-02-12 DIAGNOSIS — N289 Disorder of kidney and ureter, unspecified: Secondary | ICD-10-CM

## 2017-02-12 DIAGNOSIS — I10 Essential (primary) hypertension: Secondary | ICD-10-CM | POA: Diagnosis present

## 2017-02-12 DIAGNOSIS — I252 Old myocardial infarction: Secondary | ICD-10-CM | POA: Diagnosis not present

## 2017-02-12 HISTORY — DX: Chronic kidney disease, stage 3 (moderate): N18.3

## 2017-02-12 HISTORY — DX: Atherosclerotic heart disease of native coronary artery without angina pectoris: I25.10

## 2017-02-12 HISTORY — DX: Chronic kidney disease, stage 3 unspecified: N18.30

## 2017-02-12 LAB — BASIC METABOLIC PANEL
ANION GAP: 12 (ref 5–15)
BUN: 23 mg/dL — ABNORMAL HIGH (ref 6–20)
CALCIUM: 9.2 mg/dL (ref 8.9–10.3)
CO2: 25 mmol/L (ref 22–32)
Chloride: 106 mmol/L (ref 101–111)
Creatinine, Ser: 1.44 mg/dL — ABNORMAL HIGH (ref 0.44–1.00)
GFR, EST AFRICAN AMERICAN: 37 mL/min — AB (ref >60)
GFR, EST NON AFRICAN AMERICAN: 32 mL/min — AB (ref >60)
Glucose, Bld: 133 mg/dL — ABNORMAL HIGH (ref 65–99)
POTASSIUM: 3.6 mmol/L (ref 3.5–5.1)
SODIUM: 143 mmol/L (ref 135–145)

## 2017-02-12 LAB — TROPONIN I

## 2017-02-12 LAB — BRAIN NATRIURETIC PEPTIDE: B NATRIURETIC PEPTIDE 5: 440 pg/mL — AB (ref 0.0–100.0)

## 2017-02-12 LAB — CBC
HEMATOCRIT: 37.5 % (ref 36.0–46.0)
HEMOGLOBIN: 12.1 g/dL (ref 12.0–15.0)
MCH: 31.8 pg (ref 26.0–34.0)
MCHC: 32.3 g/dL (ref 30.0–36.0)
MCV: 98.4 fL (ref 78.0–100.0)
Platelets: 327 10*3/uL (ref 150–400)
RBC: 3.81 MIL/uL — AB (ref 3.87–5.11)
RDW: 13.2 % (ref 11.5–15.5)
WBC: 7.2 10*3/uL (ref 4.0–10.5)

## 2017-02-12 MED ORDER — POTASSIUM CHLORIDE CRYS ER 20 MEQ PO TBCR
20.0000 meq | EXTENDED_RELEASE_TABLET | Freq: Two times a day (BID) | ORAL | Status: DC
  Administered 2017-02-12 – 2017-02-13 (×2): 20 meq via ORAL
  Filled 2017-02-12 (×2): qty 1

## 2017-02-12 MED ORDER — FUROSEMIDE 10 MG/ML IJ SOLN
40.0000 mg | Freq: Two times a day (BID) | INTRAMUSCULAR | Status: DC
  Filled 2017-02-12: qty 4

## 2017-02-12 MED ORDER — ENOXAPARIN SODIUM 80 MG/0.8ML ~~LOC~~ SOLN: 1.0000 mg/kg | Freq: Once | SUBCUTANEOUS | Status: DC

## 2017-02-12 MED ORDER — NITROGLYCERIN 0.4 MG SL SUBL: 0.4000 mg | SUBLINGUAL_TABLET | SUBLINGUAL | Status: DC | PRN

## 2017-02-12 MED ORDER — METOPROLOL TARTRATE 25 MG PO TABS
25.0000 mg | ORAL_TABLET | Freq: Two times a day (BID) | ORAL | Status: DC
  Administered 2017-02-12: 25 mg via ORAL
  Filled 2017-02-12: qty 1

## 2017-02-12 MED ORDER — ATORVASTATIN CALCIUM 40 MG PO TABS
80.0000 mg | ORAL_TABLET | Freq: Every day | ORAL | Status: DC
  Administered 2017-02-12 – 2017-02-13 (×2): 80 mg via ORAL
  Filled 2017-02-12 (×2): qty 2

## 2017-02-12 MED ORDER — DILTIAZEM HCL-DEXTROSE 100-5 MG/100ML-% IV SOLN (PREMIX)
5.0000 mg/h | INTRAVENOUS | Status: DC
  Administered 2017-02-12: 5 mg/h via INTRAVENOUS
  Filled 2017-02-12: qty 100

## 2017-02-12 MED ORDER — ASPIRIN-DIPYRIDAMOLE ER 25-200 MG PO CP12
1.0000 | ORAL_CAPSULE | Freq: Two times a day (BID) | ORAL | Status: DC
  Filled 2017-02-12 (×4): qty 1

## 2017-02-12 MED ORDER — MAGNESIUM SULFATE 2 GM/50ML IV SOLN
2.0000 g | Freq: Once | INTRAVENOUS | Status: AC
  Administered 2017-02-12: 2 g via INTRAVENOUS
  Filled 2017-02-12: qty 50

## 2017-02-12 MED ORDER — FUROSEMIDE 10 MG/ML IJ SOLN
40.0000 mg | Freq: Once | INTRAMUSCULAR | Status: AC
  Administered 2017-02-12: 40 mg via INTRAVENOUS
  Filled 2017-02-12: qty 4

## 2017-02-12 MED ORDER — DILTIAZEM LOAD VIA INFUSION
10.0000 mg | Freq: Once | INTRAVENOUS | Status: AC
  Administered 2017-02-12: 10 mg via INTRAVENOUS
  Filled 2017-02-12: qty 10

## 2017-02-12 MED ORDER — ONDANSETRON HCL 4 MG/2ML IJ SOLN: 4.0000 mg | Freq: Four times a day (QID) | INTRAMUSCULAR | Status: DC | PRN

## 2017-02-12 MED ORDER — PANTOPRAZOLE SODIUM 40 MG PO TBEC
40.0000 mg | DELAYED_RELEASE_TABLET | Freq: Every morning | ORAL | Status: DC
  Administered 2017-02-13 – 2017-02-14 (×2): 40 mg via ORAL
  Filled 2017-02-12 (×2): qty 1

## 2017-02-12 MED ORDER — TRAZODONE HCL 50 MG PO TABS
25.0000 mg | ORAL_TABLET | Freq: Every evening | ORAL | Status: DC | PRN
  Administered 2017-02-12: 25 mg via ORAL
  Filled 2017-02-12 (×2): qty 1

## 2017-02-12 MED ORDER — FLUTICASONE PROPIONATE 50 MCG/ACT NA SUSP
1.0000 | Freq: Every day | NASAL | Status: DC
  Filled 2017-02-12: qty 16

## 2017-02-12 MED ORDER — ASPIRIN EC 81 MG PO TBEC
81.0000 mg | DELAYED_RELEASE_TABLET | Freq: Every day | ORAL | Status: DC
  Administered 2017-02-13 – 2017-02-14 (×2): 81 mg via ORAL
  Filled 2017-02-12 (×2): qty 1

## 2017-02-12 MED ORDER — LOPERAMIDE HCL 2 MG PO CAPS: 2.0000 mg | ORAL_CAPSULE | ORAL | Status: DC | PRN

## 2017-02-12 MED ORDER — ESTROGENS CONJUGATED 0.3 MG PO TABS
0.3000 mg | ORAL_TABLET | Freq: Every evening | ORAL | Status: DC
  Administered 2017-02-13: 0.3 mg via ORAL
  Filled 2017-02-12 (×4): qty 1

## 2017-02-12 MED ORDER — ENOXAPARIN SODIUM 40 MG/0.4ML ~~LOC~~ SOLN: 40.0000 mg | SUBCUTANEOUS | Status: DC

## 2017-02-12 MED ORDER — ONDANSETRON HCL 4 MG PO TABS: 4.0000 mg | ORAL_TABLET | Freq: Four times a day (QID) | ORAL | Status: DC | PRN

## 2017-02-12 NOTE — ED Provider Notes (Signed)
[facility] EMERGENCY DEPARTMENT Provider Note   CSN: [id number] Arrival date & time: 02/12/17  1949     History   Chief Complaint Chief Complaint  Patient presents with  . Tachycardia    HPI Brenda Hall is a 83 y.o. female.  HPI  Pt was seen at 2010. Per pt, c/o gradual onset and worsening of persistent "not feeling well" that began this evening several hours ago. Pt states she felt her "heart was racing," SOB, CP, generalized weakness. Pt states the CP radiated into her left arm. Endorses hx of MI which felt similar, so she took 2 of her own SL ntg without improvement of her symptoms. Denies hx of palpitations/tachycardia. Endorses she "just hasn't felt well" over the past few weeks, having intermittent CP radiating into her left arm.  Denies cough, no abd pain, no N/V/D, no back pain, no fevers, no rash.   Past Medical History:  Diagnosis Date  . Anemia   . Cancer ([place]) 1988 and  2009   BCC nose and  Melanoma back  . Cancer of skin Jan. 2014   [facility] Forehead and Nose  . Carotid stenosis    mild  . Chest pain, unspecified   . CHF (congestive heart failure) ([place])   . Chronic back pain   . Coronary atherosclerosis of unspecified type of vessel, native or graft   . DJD (degenerative joint disease) of knee   . History of TIA (transient ischemic attack)   . HTN (hypertension)   . IBS (irritable bowel syndrome)   . Myocardial infarction [facility])    5 yrs ago     dr wall  cardiac  . Osteoporosis   . Other diseases of lung, not elsewhere classified    lung nodule  . Polymyalgia rheumatica ([place])   . Shingles 01/2016-02/2016  . Stroke [facility])     Patient Active Problem List   Diagnosis Date Noted  . Osteopenia of multiple sites 07/23/2016  . Age-related osteoporosis without current pathological fracture 07/11/2016  . History of total knee replacement, bilateral 07/11/2016  . History of COPD 07/11/2016  . Spondylosis of lumbar region without myelopathy or radiculopathy  07/11/2016  . Primary osteoarthritis of both hands 07/11/2016  . Primary osteoarthritis of both feet 07/11/2016  . Vitamin D deficiency 07/11/2016  . History of vertebral fracture 07/11/2016  . History of IBS 07/11/2016  . Basal cell carcinoma 07/11/2016  . History of CVA (cerebrovascular accident)/ has metal implant not MRI safe  07/11/2016  . Other and unspecified hyperlipidemia 11/19/2012  . Cerebrovascular accident (stroke) ([place]) 11/19/2012  . Difficulty in walking(719.7) 11/06/2012  . Occlusion and stenosis of carotid artery without mention of cerebral infarction 02/07/2012  . OLD MYOCARDIAL INFARCTION 11/07/2009  . CAD, NATIVE VESSEL 11/07/2009  . Essential hypertension 09/28/2008  . IRRITABLE BOWEL SYNDROME 09/28/2008  . DEGENERATIVE JOINT DISEASE, KNEE 09/28/2008  . POLYMYALGIA RHEUMATICA 09/28/2008  . CHEST PAIN UNSPECIFIED 09/28/2008    Past Surgical History:  Procedure Laterality Date  . ABDOMINAL HYSTERECTOMY  1977   partial  . BACK SURGERY  1982  . CARDIAC CATHETERIZATION    . COLONOSCOPY N/A 01/27/2013   Procedure: COLONOSCOPY;  Surgeon: Rogene Houston, MD;  Location: [facility];  Service: Endoscopy;  Laterality: N/A;  1030  . JOINT REPLACEMENT  2001 and  2011   6 knee surgeries   bil replacement  . KNEE ARTHROSCOPY    . LUMBAR LAMINECTOMY    . PR VEIN BYPASS GRAFT,AORTO-FEM-POP  2010  . removal of melanoma from back  2007  . SPINE SURGERY  2013   Broken back: fusion surgery  . tonisllectomy  1944  . total knee anthroplasty    . VERTEBROPLASTY  07/11/2011   Procedure: VERTEBROPLASTY;  Surgeon: Faythe Ghee, MD;  Location: [facility] NEURO ORS;  Service: Neurosurgery;  Laterality: N/A;  Vertebroplasty of Lumbar Two    OB History    No data available       Home Medications    Prior to Admission medications   Medication Sig Start Date End Date Taking? Authorizing Provider  aspirin 81 MG tablet Take 81 mg by mouth daily.    [provider]    atorvastatin (LIPITOR) 80 MG tablet Take 1 tablet (80 mg total) by mouth at bedtime. 12/03/16   [name], MD  Calcium Carbonate-Vit D-Min (CALCIUM 1200 PO) Take by mouth.    [provider]  dipyridamole-aspirin (AGGRENOX) 200-25 MG 12hr capsule Take 1 capsule by mouth 2 (two) times daily. 12/08/15   [name], MD  estrogens, conjugated, (PREMARIN) 0.3 MG tablet Take 1 tablet (0.3 mg total) by mouth daily. Take daily for 21 days then do not take for 7 days. 12/03/16   [name], MD  fluticasone Asencion Islam) 50 MCG/ACT nasal spray USE TWO SPRAYS IN [facility] NOSTRIL ONCE DAILY 12/03/16   [name], MD  loperamide (IMODIUM) 2 MG capsule Take by mouth as needed for diarrhea or loose stools.    [provider]  metoprolol tartrate (LOPRESSOR) 25 MG tablet TAKE 1 TABLET TWICE A DAY 08/26/16   Arnoldo Lenis, MD  nitroGLYCERIN (NITROSTAT) 0.4 MG SL tablet Place 1 tablet (0.4 mg total) under the tongue every 5 (five) minutes as needed for chest pain. 12/03/16   [name], MD  pantoprazole (PROTONIX) 40 MG tablet Take 1 tablet (40 mg total) by mouth every morning. 11/12/16   Bo Merino, MD  Zinc 50 MG CAPS Take 50 mg by mouth daily.    [provider]    Family History Family History  Problem Relation Age of Onset  . Heart disease Father   . Hypertension Father   . Heart attack Father   . Cancer Mother        bladder   . Hypertension Daughter   . Hypertension Son   . Colon cancer Neg Hx     Social History Social History  Substance Use Topics  . Smoking status: Never Smoker  . Smokeless tobacco: Never Used     Comment: tobacco use - no  . Alcohol use 0.0 oz/week     Comment: occasionally a glass of wine or margarita per pt     Allergies   Epinephrine; Clopidogrel bisulfate; and Oxycodone   Review of Systems Review of Systems ROS: Statement: All systems negative except as marked or noted in the HPI; Constitutional:  Negative for fever and chills. ; ; Eyes: Negative for eye pain, redness and discharge. ; ; ENMT: Negative for ear pain, hoarseness, nasal congestion, sinus pressure and sore throat. ; ; Cardiovascular: +palpitations, SOB, CP. Negative for diaphoresis, and peripheral edema. ; ; Respiratory: Negative for cough, wheezing and stridor. ; ; Gastrointestinal: Negative for nausea, vomiting, diarrhea, abdominal pain, blood in stool, hematemesis, jaundice and rectal bleeding. . ; ; Genitourinary: Negative for dysuria, flank pain and hematuria. ; ; Musculoskeletal: Negative for back pain and neck pain. Negative for swelling and trauma.; ; Skin: Negative for pruritus,  rash, abrasions, blisters, bruising and skin lesion.; ; Neuro: +generalized weakness. Negative for headache, lightheadedness and neck stiffness. Negative for altered level of consciousness, altered mental status, extremity weakness, paresthesias, involuntary movement, seizure and syncope.      Physical Exam Updated Vital Signs BP 136/79   Pulse (!) 102   Temp (!) 97.5 F (36.4 C) (Oral)   Resp 19   Ht 5\' 3"  (1.6 m)   Wt 70.8 kg (156 lb)   SpO2 96%   BMI 27.63 kg/m     Patient Vitals for the past 24 hrs:  BP Temp Temp src Pulse Resp SpO2 Height Weight  02/12/17 2130 (!) 145/65 - - 69 - 100 % - -  02/12/17 2100 131/81 - - - 13 - - -  02/12/17 2040 - - - (!) 102 19 96 % - -  02/12/17 2030 (!) 135/117 - - (!) 157 (!) 27 94 % - -  02/12/17 2000 (!) 122/93 - - (!) 152 (!) 23 97 % - -  02/12/17 1958 136/79 (!) 97.5 F (36.4 C) Oral (!) 150 (!) 24 95 % 5\' 3"  (1.6 m) 70.8 kg (156 lb)     Physical Exam 2015: Physical examination:  Nursing notes reviewed; Vital signs and O2 SAT reviewed;  Constitutional: Well developed, Well nourished, Well hydrated, In no acute distress; Head:  Normocephalic, atraumatic; Eyes: EOMI, PERRL, No scleral icterus; ENMT: Mouth and pharynx normal, Mucous membranes moist; Neck: Supple, Full range of motion, No  lymphadenopathy; Cardiovascular: Irregular tachycardic rate and rhythm, No gallop; Respiratory: Breath sounds coarse & equal bilaterally, No wheezes.  Speaking full sentences with ease, Normal respiratory effort/excursion; Chest: Nontender, Movement normal; Abdomen: Soft, Nontender, Nondistended, Normal bowel sounds; Genitourinary: No CVA tenderness; Extremities: Pulses normal, No tenderness, No edema, No calf edema or asymmetry.; Neuro: AA&Ox3, Major CN grossly intact.  Speech clear. No gross focal motor or sensory deficits in extremities.; Skin: Color normal, Warm, Dry.   ED Treatments / Results  Labs (all labs ordered are listed, but only abnormal results are displayed)   EKG  EKG Interpretation  Date/Time:  Wednesday February 12 2017 19:57:43 EDT Ventricular Rate:  157 PR Interval:    QRS Duration: 82 QT Interval:  310 QTC Calculation: 501 R Axis:   100 Text Interpretation:  Atrial fibrillation with rapid V-rate Right axis deviation ST depression, probably rate related When compared with ECG of 01/30/2009 Atrial fibrillation with rapid ventricular response is now Present Nonspecific ST and T wave abnormality is now Present Confirmed by Francine Graven [phone]) on 02/12/2017 8:57:13 PM       Radiology   Procedures Procedures (including critical care time)  Medications Ordered in ED Medications  diltiazem (CARDIZEM) 1 mg/mL load via infusion 10 mg (10 mg Intravenous Bolus from Bag 02/12/17 2035)    And  diltiazem (CARDIZEM) 100 mg in dextrose 5% 195mL (1 mg/mL) infusion (5 mg/hr Intravenous New Bag/Given 02/12/17 2037)     Initial Impression / Assessment and Plan / ED Course  I have reviewed the triage vital signs and the nursing notes.  Pertinent labs & imaging results that were available during my care of the patient were reviewed by me and considered in my medical decision making (see chart for details).  MDM Reviewed: previous chart, nursing note and vitals Reviewed  previous: labs and ECG Interpretation: labs, ECG and x-ray Total time providing critical care: 30-74 minutes. This excludes time spent performing separately reportable procedures and services. Consults: admitting MD  CRITICAL CARE Performed by: Alfonzo Feller Total critical care time: 35 minutes Critical care time was exclusive of separately billable procedures and treating other patients. Critical care was necessary to treat or prevent imminent or life-threatening deterioration. Critical care was time spent personally by me on the following activities: development of treatment plan with patient and/or surrogate as well as nursing, discussions with consultants, evaluation of patient's response to treatment, examination of patient, obtaining history from patient or surrogate, ordering and performing treatments and interventions, ordering and review of laboratory studies, ordering and review of radiographic studies, pulse oximetry and re-evaluation of patient's condition.   Results for orders placed or performed during the hospital encounter of 67/34/19  Basic metabolic panel  Result Value Ref Range   Sodium 143 135 - 145 mmol/L   Potassium 3.6 3.5 - 5.1 mmol/L   Chloride 106 101 - 111 mmol/L   CO2 25 22 - 32 mmol/L   Glucose, Bld 133 (H) 65 - 99 mg/dL   BUN 23 (H) 6 - 20 mg/dL   Creatinine, Ser 1.44 (H) 0.44 - 1.00 mg/dL   Calcium 9.2 8.9 - 10.3 mg/dL   GFR calc non Af Amer 32 (L) >60 mL/min   GFR calc Af Amer 37 (L) >60 mL/min   Anion gap 12 5 - 15  CBC  Result Value Ref Range   WBC 7.2 4.0 - 10.5 K/uL   RBC 3.81 (L) 3.87 - 5.11 MIL/uL   Hemoglobin 12.1 12.0 - 15.0 g/dL   HCT 37.5 36.0 - 46.0 %   MCV 98.4 78.0 - 100.0 fL   MCH 31.8 26.0 - 34.0 pg   MCHC 32.3 30.0 - 36.0 g/dL   RDW 13.2 11.5 - 15.5 %   Platelets 327 150 - 400 K/uL  Troponin I  Result Value Ref Range   Troponin I <0.03 <0.03 ng/mL  Brain natriuretic peptide  Result Value Ref Range   B Natriuretic  Peptide 440.0 (H) 0.0 - 100.0 pg/mL   Dg Chest Portable 1 View Result Date: 02/12/2017 CLINICAL DATA:  Acute chest pain and tachycardia today. EXAM: PORTABLE CHEST 1 VIEW COMPARISON:  07/10/2011 and prior exams FINDINGS: The cardiomediastinal silhouette is unremarkable. Pulmonary vascular congestion and mild interstitial opacities noted. No pleural effusion or pneumothorax noted. No acute bony abnormalities are present. IMPRESSION: Pulmonary vascular congestion and probable mild interstitial pulmonary edema. Electronically Signed   By: Margarette Canada M.D.   On: 02/12/2017 20:19   Results for AYLEE, LITTRELL (MRN [id number]) as of 02/12/2017 21:44  Ref. Range 02/10/2016 08:43 06/07/2016 10:47 12/04/2016 09:39 01/14/2017 10:31 02/12/2017 20:14  BUN Latest Ref Range: 6 - 20 mg/dL 29 (H) 18 29 (H) 19 23 (H)  Creatinine Latest Ref Range: 0.44 - 1.00 mg/dL 1.24 (H) 1.25 (H) 1.38 (H) 1.32 (H) 1.44 (H)     2155:  HR 150's on arrival, monitor with afib/RVR. Pt denies hx of same. IV cardizem bolus and gtt started with gradual improvement of HR. HR now 60's-80's; will start to titrate cardizem gtt down.  Mild pulmonary edema no CXR with elevated BNP; will dose IV lasix. Pt states CP has improved. Dx and testing d/w pt and family.  Questions answered.  Verb understanding, agreeable to admit.  T/C to [facility]. Olevia Bowens, case discussed, including:  HPI, pertinent PM/SHx, VS/PE, dx testing, ED course and treatment:  Agreeable to admit.     Final Clinical Impressions(s) / ED Diagnoses   Final diagnoses:  None    New  Prescriptions New Prescriptions   No medications on file     Francine Graven, DO 02/15/17 [id number]

## 2017-02-12 NOTE — ED Triage Notes (Addendum)
Pt states that she was not feeling well this evening, noticed that her hr was racing, felt weakness, sob. Pt states that she took two nitro without any change in her pain level.

## 2017-02-12 NOTE — H&P (Signed)
History and Physical    Secret H Mooney-Riggs YPP:[id number] DOB: 04/19/1931 DOA: 02/12/2017  PCP: [name], MD   Patient coming from: Home.  I have personally briefly reviewed patient's old medical records in [place]  Chief Complaint: Weakness and palpitations.  HPI: Brenda Hall is a 83 y.o. female with medical history significant of anemia, BCC of nose and melanoma of the back, mild carotid stenosis, CAD, history of MI, unspecified CHF, chronic back pain, osteoarthritis, IBS, hypertension, osteoporosis, lung nodule, polymyalgia rheumatica (currently not active), history of herpes zoster, history of TIA, history of CVA who is coming to the emergency department with complaints of weakness, palpitations, chest pressure and fatigue since earlier today.  Per patient, she went to her water aerobics for an hour this morning and did not have any symptoms. She mentions that she has normally goes to these pool exercises 3 times a week and does not have any trouble. The rest of the day was going as usual, until earlier this evening she began feeling palpitations, weakness, shortness of breath and chest pressure, radiates to her left shoulder and arm, similar to when she had her previous MI. She took 2 sublingual nitroglycerin without any changes in her chest discomfort, but noticed that her palpitations got worse. She denies fever, chills, dizziness, nausea or emesis. No PND, orthopnea or recent pitting edema of the lower extremities. She denies abdominal pain, diarrhea, constipation, melena or hematochezia. She has been experiencing urinary urgency for over a week, and was scheduled to see the urologist tomorrow morning. No dysuria or hematuria.  ED Course: Initial vital signs in the emergency department temperature 97.82F, pulse ox 150, blood pressure 136/79 mmHg, respirations 24 and O2 sat 95% on room air. She was given Cardizem 10 mg IVP bolus dose, which was followed by a  Cardizem infusion to control her heart rate. Also furosemide 40 mg IVP 1.  Her lab work shows an EKG showing A. fib with RVR at 157 BPM with ST depression, negative troponin, BNP 440 pg/mL. She has a pending urinalysis, unremarkable CBC. Chemistry shows a sodium 143, potassium 3.6, chloride 106, bicarbonate 25 mmol/L. BUN was 23, creatinine 1.44 and glucose 133 mg/dL. Her chest radiograph shows pulmonary vascular congestion with mild interstitial pulmonary edema  Review of Systems: As per HPI otherwise 10 point review of systems negative.    Past Medical History:  Diagnosis Date  . Anemia   . Cancer ([place]) 1988 and  2009   BCC nose and  Melanoma back  . Cancer of skin Jan. 2014   [facility] Forehead and Nose  . Carotid stenosis    mild  . Chest pain, unspecified   . CHF (congestive heart failure) ([place])   . Chronic back pain   . Coronary atherosclerosis of unspecified type of vessel, native or graft   . DJD (degenerative joint disease) of knee   . History of TIA (transient ischemic attack)   . HTN (hypertension)   . IBS (irritable bowel syndrome)   . Myocardial infarction [facility])    5 yrs ago     dr wall  cardiac  . Osteoporosis   . Other diseases of lung, not elsewhere classified    lung nodule  . Polymyalgia rheumatica ([place])   . Shingles 01/2016-02/2016  . Stroke [facility])     Past Surgical History:  Procedure Laterality Date  . ABDOMINAL HYSTERECTOMY  1977   partial  . BACK SURGERY  1982  . CARDIAC CATHETERIZATION    .  COLONOSCOPY N/A 01/27/2013   Procedure: COLONOSCOPY;  Surgeon: Rogene Houston, MD;  Location: [facility];  Service: Endoscopy;  Laterality: N/A;  1030  . JOINT REPLACEMENT  2001 and  2011   6 knee surgeries   bil replacement  . KNEE ARTHROSCOPY    . LUMBAR LAMINECTOMY    . PR VEIN BYPASS GRAFT,AORTO-FEM-POP  2010  . removal of melanoma from back  2007  . SPINE SURGERY  2013   Broken back: fusion surgery  . tonisllectomy  1944  . total knee anthroplasty    .  VERTEBROPLASTY  07/11/2011   Procedure: VERTEBROPLASTY;  Surgeon: Faythe Ghee, MD;  Location: [place] NEURO ORS;  Service: Neurosurgery;  Laterality: N/A;  Vertebroplasty of Lumbar Two     reports that she has never smoked. She has never used smokeless tobacco. She reports that she drinks alcohol. She reports that she does not use drugs.  Allergies  Allergen Reactions  . Epinephrine Other (See Comments)    During dental procedure-caused facial swelling at injection site. Pt has had epinephrine since then for nose and facial surgery and did fine with it per pt.  . Clopidogrel Bisulfate Itching and Rash    Plavix  . Oxycodone     hallucinations    Family History  Problem Relation Age of Onset  . Heart disease Father   . Hypertension Father   . Heart attack Father   . Cancer Mother        bladder   . Hypertension Daughter   . Hypertension Son   . Colon cancer Neg Hx     Prior to Admission medications   Medication Sig Start Date End Date Taking? Authorizing Provider  aspirin 81 MG tablet Take 81 mg by mouth daily.   Yes [provider]  atorvastatin (LIPITOR) 80 MG tablet Take 1 tablet (80 mg total) by mouth at bedtime. 12/03/16  Yes [name], MD  Calcium Carbonate-Vit D-Min (CALCIUM 1200 PO) Take 1 tablet by mouth at bedtime.    Yes [provider]  dipyridamole-aspirin (AGGRENOX) 200-25 MG 12hr capsule Take 1 capsule by mouth 2 (two) times daily. 12/08/15  Yes [name], MD  estrogens, conjugated, (PREMARIN) 0.3 MG tablet Take 1 tablet (0.3 mg total) by mouth daily. Take daily for 21 days then do not take for 7 days. Patient taking differently: Take 0.3 mg by mouth every evening.  12/03/16  Yes [name], MD  fluticasone [facility]) 50 MCG/ACT nasal spray USE TWO SPRAYS IN EACH NOSTRIL ONCE DAILY 12/03/16  Yes [name], MD  loperamide (IMODIUM) 2 MG capsule Take by mouth as needed for diarrhea or loose stools.   Yes [provider]  metoprolol tartrate (LOPRESSOR) 25 MG tablet TAKE 1 TABLET TWICE A DAY 08/26/16  Yes Branch, Alphonse Guild, MD  nitroGLYCERIN (NITROSTAT) 0.4 MG SL tablet Place 1 tablet (0.4 mg total) under the tongue every 5 (five) minutes as needed for chest pain. 12/03/16  Yes [name], MD  pantoprazole (PROTONIX) 40 MG tablet Take 1 tablet (40 mg total) by mouth every morning. 11/12/16  Yes Deveshwar, Abel Presto, MD  Zinc 50 MG CAPS Take 50 mg by mouth daily.   Yes [provider]    Physical Exam: Vitals:   02/12/17 2130 02/12/17 2200 02/12/17 2300 02/12/17 2316  BP: (!) 145/65 133/67 (!) 152/82   Pulse: 69  67   Resp:   14   Temp:  TempSrc:      SpO2: 100%  100% 95%  Weight:      Height:        Constitutional: NAD, calm, comfortable Eyes: PERRL, lids and conjunctivae normal ENMT: Mucous membranes are moist. Posterior pharynx clear of any exudate or lesions. Neck: normal, supple, no masses, no thyromegaly Respiratory: Mild bibasilar crackles, no wheezing. Normal respiratory effort. No accessory muscle use.  Cardiovascular: Irregularly irregular, no murmurs / rubs / gallops. No extremity edema. 2+ pedal pulses. No carotid bruits.  Abdomen: Soft, no tenderness, no masses palpated. No hepatosplenomegaly. Bowel sounds positive.  Musculoskeletal: no clubbing / cyanosis. Good ROM, no contractures. Normal muscle tone.  Skin: Positive previous scars from surgery on nose, lower back, abdomen and extremities. Neurologic: CN 2-12 grossly intact. Sensation intact, DTR normal. Strength 5/5 in all 4.  Psychiatric: Normal judgment and insight. Alert and oriented x 4.. Normal mood.    Labs on Admission: I have personally reviewed following labs and imaging studies  CBC:  Recent Labs Lab 02/12/17 2014  WBC 7.2  HGB 12.1  HCT 37.5  MCV 98.4  PLT 660   Basic Metabolic Panel:  Recent Labs Lab 02/12/17 2014  NA 143  K 3.6  CL 106  CO2 25  GLUCOSE 133*  BUN 23*  CREATININE  1.44*  CALCIUM 9.2   GFR: Estimated Creatinine Clearance: 27.5 mL/min (A) (by C-G formula based on SCr of 1.44 mg/dL (H)). Liver Function Tests: No results for input(s): AST, ALT, ALKPHOS, BILITOT, PROT, ALBUMIN in the last 168 hours. No results for input(s): LIPASE, AMYLASE in the last 168 hours. No results for input(s): AMMONIA in the last 168 hours. Coagulation Profile: No results for input(s): INR, PROTIME in the last 168 hours. Cardiac Enzymes:  Recent Labs Lab 02/12/17 2014  TROPONINI <0.03   BNP (last 3 results) No results for input(s): PROBNP in the last 8760 hours. HbA1C: No results for input(s): HGBA1C in the last 72 hours. CBG: No results for input(s): GLUCAP in the last 168 hours. Lipid Profile: No results for input(s): CHOL, HDL, LDLCALC, TRIG, CHOLHDL, LDLDIRECT in the last 72 hours. Thyroid Function Tests: No results for input(s): TSH, T4TOTAL, FREET4, T3FREE, THYROIDAB in the last 72 hours. Anemia Panel: No results for input(s): VITAMINB12, FOLATE, FERRITIN, TIBC, IRON, RETICCTPCT in the last 72 hours. Urine analysis:    Component Value Date/Time   COLORURINE YELLOW 07/28/2008 [facility] 07/28/2008 1156   LABSPEC 1.022 07/28/2008 1156   PHURINE 5.0 07/28/2008 1156   GLUCOSEU NEGATIVE 07/28/2008 1156   HGBUR NEGATIVE 07/28/2008 1156   BILIRUBINUR NEGATIVE 07/28/2008 1156   KETONESUR NEGATIVE 07/28/2008 1156   PROTEINUR NEGATIVE 07/28/2008 1156   UROBILINOGEN 0.2 07/28/2008 1156   NITRITE NEGATIVE 07/28/2008 1156   LEUKOCYTESUR  07/28/2008 1156    NEGATIVE MICROSCOPIC NOT DONE ON URINES WITH NEGATIVE PROTEIN, BLOOD, LEUKOCYTES, NITRITE, OR GLUCOSE <1000 mg/dL.    Radiological Exams on Admission: Dg Chest Portable 1 View  Result Date: 02/12/2017 CLINICAL DATA:  Acute chest pain and tachycardia today. EXAM: PORTABLE CHEST 1 VIEW COMPARISON:  07/10/2011 and prior exams FINDINGS: The cardiomediastinal silhouette is unremarkable. Pulmonary  vascular congestion and mild interstitial opacities noted. No pleural effusion or pneumothorax noted. No acute bony abnormalities are present. IMPRESSION: Pulmonary vascular congestion and probable mild interstitial pulmonary edema. Electronically Signed   By: Margarette Canada M.D.   On: 02/12/2017 20:19    EKG: Independently reviewed. Vent. rate 157 BPM PR interval * ms  QRS duration 82 ms QT/QTc 310/501 ms P-R-T axes * 100 12 Atrial fibrillation with rapid V-rate Right axis deviation ST depression, probably rate related  Assessment/Plan Principal Problem:   Atrial fibrillation with rapid ventricular response (HCC) CHA?DS?-VASc Score of 8. Cardizem infusion and now is off, since her heart rate is in the low 70s. Continue metoprolol titrate 25 mg by mouth twice a day, evening dose now. Supplement potassium and magnesium. Check echocardiogram in the morning. Single dose of Lovenox 1 mg per Kg tonight. Long-term anticoagulation to be discussed with the patient prior to discharge. Consult cardiology in the morning.  Active Problems:   Pulmonary edema Continue supplemental oxygen. Continue furosemide 40 mg IVP twice a day. Replace electrolytes as needed. Monitor renal function and electrolytes. Echocardiogram ordered.    Chest pain, unspecified Trend troponin level. Check echo in A.M. Continue antiplatelet therapy, beta blocker and sublingual nitroglycerin as needed. Routine cardiology evaluation in the morning.    CAD, NATIVE VESSEL Denies chest pain at this time. Continue Aggrenox, low-dose aspirin, metoprolol and atorvastatin. Nitroglycerin as needed.    Essential hypertension Continue metoprolol 25 mg by mouth twice a day. Monitor blood pressure.    Hyperlipidemia Continue atorvastatin 80 mg by mouth daily. Check LFTs in a.m.    Urgency of micturition Was supposed to see urologist tomorrow morning. Check urinalysis.    DVT prophylaxis: Lovenox SQ. Code Status:  Full code.  Family Communication:  Disposition Plan: Admit for rate control and volume overload treatment. Consults called: Cardiology routine consult in the morning. Admission status: Inpatient/stepdown.   Reubin Milan MD [facility] Pager [phone].  If 7PM-7AM, please contact night-coverage [email or web address] Password [email or web address]  02/12/2017, 11:21 PM

## 2017-02-13 ENCOUNTER — Inpatient Hospital Stay (HOSPITAL_COMMUNITY): Payer: Medicare Other

## 2017-02-13 ENCOUNTER — Encounter (HOSPITAL_COMMUNITY): Payer: Self-pay | Admitting: Physician Assistant

## 2017-02-13 DIAGNOSIS — R3915 Urgency of urination: Secondary | ICD-10-CM

## 2017-02-13 DIAGNOSIS — J810 Acute pulmonary edema: Secondary | ICD-10-CM

## 2017-02-13 DIAGNOSIS — I4891 Unspecified atrial fibrillation: Secondary | ICD-10-CM

## 2017-02-13 DIAGNOSIS — E785 Hyperlipidemia, unspecified: Secondary | ICD-10-CM

## 2017-02-13 DIAGNOSIS — I10 Essential (primary) hypertension: Secondary | ICD-10-CM

## 2017-02-13 DIAGNOSIS — R079 Chest pain, unspecified: Secondary | ICD-10-CM

## 2017-02-13 LAB — CBC WITH DIFFERENTIAL/PLATELET
BASOS ABS: 0.1 10*3/uL (ref 0.0–0.1)
BASOS PCT: 1 %
EOS PCT: 6 %
Eosinophils Absolute: 0.3 10*3/uL (ref 0.0–0.7)
HCT: 36.9 % (ref 36.0–46.0)
Hemoglobin: 12 g/dL (ref 12.0–15.0)
Lymphocytes Relative: 31 %
Lymphs Abs: 1.8 10*3/uL (ref 0.7–4.0)
MCH: 31.7 pg (ref 26.0–34.0)
MCHC: 32.5 g/dL (ref 30.0–36.0)
MCV: 97.6 fL (ref 78.0–100.0)
MONO ABS: 0.6 10*3/uL (ref 0.1–1.0)
Monocytes Relative: 10 %
Neutro Abs: 2.9 10*3/uL (ref 1.7–7.7)
Neutrophils Relative %: 52 %
PLATELETS: 295 10*3/uL (ref 150–400)
RBC: 3.78 MIL/uL — ABNORMAL LOW (ref 3.87–5.11)
RDW: 13.4 % (ref 11.5–15.5)
WBC: 5.6 10*3/uL (ref 4.0–10.5)

## 2017-02-13 LAB — URINALYSIS, ROUTINE W REFLEX MICROSCOPIC
Bilirubin Urine: NEGATIVE
Glucose, UA: NEGATIVE mg/dL
Hgb urine dipstick: NEGATIVE
Ketones, ur: NEGATIVE mg/dL
LEUKOCYTES UA: NEGATIVE
NITRITE: NEGATIVE
PH: 5 (ref 5.0–8.0)
Protein, ur: NEGATIVE mg/dL
SPECIFIC GRAVITY, URINE: 1.004 — AB (ref 1.005–1.030)

## 2017-02-13 LAB — BASIC METABOLIC PANEL
ANION GAP: 8 (ref 5–15)
BUN: 25 mg/dL — ABNORMAL HIGH (ref 6–20)
CALCIUM: 9 mg/dL (ref 8.9–10.3)
CHLORIDE: 101 mmol/L (ref 101–111)
CO2: 30 mmol/L (ref 22–32)
Creatinine, Ser: 1.49 mg/dL — ABNORMAL HIGH (ref 0.44–1.00)
GFR, EST AFRICAN AMERICAN: 36 mL/min — AB (ref >60)
GFR, EST NON AFRICAN AMERICAN: 31 mL/min — AB (ref >60)
Glucose, Bld: 98 mg/dL (ref 65–99)
Potassium: 3.9 mmol/L (ref 3.5–5.1)
Sodium: 139 mmol/L (ref 135–145)

## 2017-02-13 LAB — TSH: TSH: 1.614 u[IU]/mL (ref 0.350–4.500)

## 2017-02-13 LAB — TROPONIN I
TROPONIN I: 0.25 ng/mL — AB (ref <0.03)
Troponin I: 0.23 ng/mL (ref <0.03)
Troponin I: 0.19 ng/mL (ref <0.03)

## 2017-02-13 LAB — MRSA PCR SCREENING: MRSA by PCR: NEGATIVE

## 2017-02-13 MED ORDER — METOPROLOL TARTRATE 50 MG PO TABS
50.0000 mg | ORAL_TABLET | Freq: Two times a day (BID) | ORAL | Status: DC
  Administered 2017-02-13 – 2017-02-14 (×3): 50 mg via ORAL
  Filled 2017-02-13 (×3): qty 1

## 2017-02-13 MED ORDER — SODIUM CHLORIDE 0.9% FLUSH: 3.0000 mL | INTRAVENOUS | Status: DC | PRN

## 2017-02-13 MED ORDER — SODIUM CHLORIDE 0.9 % IV SOLN
250.0000 mL | INTRAVENOUS | Status: DC | PRN
Start: 2017-02-13 — End: 2017-02-14

## 2017-02-13 MED ORDER — RIVAROXABAN 15 MG PO TABS
15.0000 mg | ORAL_TABLET | Freq: Every day | ORAL | Status: DC
  Administered 2017-02-13 – 2017-02-14 (×2): 15 mg via ORAL
  Filled 2017-02-13 (×2): qty 1

## 2017-02-13 MED ORDER — SODIUM CHLORIDE 0.9% FLUSH
3.0000 mL | Freq: Two times a day (BID) | INTRAVENOUS | Status: DC
Start: 2017-02-13 — End: 2017-02-14
  Administered 2017-02-13 – 2017-02-14 (×3): 3 mL via INTRAVENOUS

## 2017-02-13 NOTE — Progress Notes (Signed)
PROGRESS NOTE    Brenda Hall  ASN:[id number]  DOB: 12/30/1930  DOA: 02/12/2017 PCP: [name], MD   Brief Admission Hx: Brenda Hall is a 83 y.o. female with medical history significant of anemia, BCC of nose and melanoma of the back, mild carotid stenosis, CAD, history of MI, unspecified CHF, chronic back pain, osteoarthritis, IBS, hypertension, osteoporosis, lung nodule, polymyalgia rheumatica (currently not active), history of herpes zoster, history of TIA, history of CVA who is coming to the emergency department with complaints of weakness, palpitations, chest pressure and fatigue   MDM/Assessment & Plan:   1. Atrial Fib with RVR - resolved rapidly with IV diltiazem, now off drip, HR 60s, asymptomatic now.  I asked for cardiology to evaluate today.  She is followed by Dr. Harl Bowie.  Her CHADVASC is 8. Echo pending.  She was given dose of lovenox 1 mg/kg on admission for anticoagulation.  2. Acute CHF exacerbation with pulmonary edema - Pt says she feels better after IV lasix given.  She has been voiding most of night.   Echo pending.  Repeat portable CXR pending.   3. Chest Pain with elevated troponin - AM EKG reviewed, ?demand ischemia from [facility], will get cardiology consult.  Trending troponins.  4. CAD - as above.  5. Essential Hypertension - BP much better controlled now, resumed all home BP meds.  Metoprolol 25 mg BID.  6. Dyslipidemia - resumed home statin.  7. Urinary Urgency - chronic problem, normal urinalysis, follow up with urology outpatient.   8. Generalized Weakness - symptoms seem to have resolved now, likely was related to Afib RVR.    DVT prophylaxis: lovenox Code Status: Full Family Communication:  Disposition Plan: TBD   Consultants:  cardiology  Procedures:  Echocardiogram pending   Subjective: Pt says she feels much better now, no more palpitations, no chest pain, asking about going home.   Objective: Vitals:   02/13/17 0200  02/13/17 0300 02/13/17 0400 02/13/17 0500  BP: (!) 133/53 (!) 112/45 (!) 142/69 (!) 145/67  Pulse: (!) 58 (!) 57 62 64  Resp: 17 19 13 14   Temp:   98.5 F (36.9 C)   TempSrc:   Oral   SpO2: 95% 92% 97% 95%  Weight:    71.2 kg (156 lb 15.5 oz)  Height:        Intake/Output Summary (Last 24 hours) at 02/13/17 1937 Last data filed at 02/13/17 0500  Gross per 24 hour  Intake              530 ml  Output              300 ml  Net              230 ml   Filed Weights   02/12/17 1958 02/12/17 2324 02/13/17 0500  Weight: 70.8 kg (156 lb) 71.2 kg (156 lb 15.5 oz) 71.2 kg (156 lb 15.5 oz)     REVIEW OF SYSTEMS  As per history otherwise all reviewed and reported negative  Exam:  General exam: awake, alert, NAD.  Respiratory system:  No increased work of breathing. Cardiovascular system: S1 & S2 heard normal. Gastrointestinal system: Abdomen is nondistended, soft and nontender. Normal bowel sounds heard. Central nervous system: Alert and oriented. No focal neurological deficits. Extremities: no cyanosis or clubbing.  Data Reviewed: Basic Metabolic Panel:  Recent Labs Lab 02/12/17 2014  NA 143  K 3.6  CL 106  CO2 25  GLUCOSE 133*  BUN 23*  CREATININE 1.44*  CALCIUM 9.2   Liver Function Tests: No results for input(s): AST, ALT, ALKPHOS, BILITOT, PROT, ALBUMIN in the last 168 hours. No results for input(s): LIPASE, AMYLASE in the last 168 hours. No results for input(s): AMMONIA in the last 168 hours. CBC:  Recent Labs Lab 02/12/17 2014  WBC 7.2  HGB 12.1  HCT 37.5  MCV 98.4  PLT 327   Cardiac Enzymes:  Recent Labs Lab 02/12/17 2014 02/13/17 0146  TROPONINI <0.03 0.23*   CBG (last 3)  No results for input(s): GLUCAP in the last 72 hours. Recent Results (from the past 240 hour(s))  MRSA PCR Screening     Status: None   Collection Time: 02/12/17 11:07 PM  Result Value Ref Range Status   MRSA by PCR NEGATIVE NEGATIVE Final    Comment:        The GeneXpert  MRSA Assay (FDA approved for NASAL specimens only), is one component of a comprehensive MRSA colonization surveillance program. It is not intended to diagnose MRSA infection nor to guide or monitor treatment for MRSA infections.     Studies: Dg Chest Portable 1 View  Result Date: 02/12/2017 CLINICAL DATA:  Acute chest pain and tachycardia today. EXAM: PORTABLE CHEST 1 VIEW COMPARISON:  07/10/2011 and prior exams FINDINGS: The cardiomediastinal silhouette is unremarkable. Pulmonary vascular congestion and mild interstitial opacities noted. No pleural effusion or pneumothorax noted. No acute bony abnormalities are present. IMPRESSION: Pulmonary vascular congestion and probable mild interstitial pulmonary edema. Electronically Signed   By: Margarette Canada M.D.   On: 02/12/2017 20:19   Scheduled Meds: . aspirin EC  81 mg Oral Daily  . atorvastatin  80 mg Oral QHS  . dipyridamole-aspirin  1 capsule Oral BID  . enoxaparin (LOVENOX) injection  1 mg/kg Subcutaneous Once  . estrogens (conjugated)  0.3 mg Oral QPM  . fluticasone  1 spray Each Nare Daily  . furosemide  40 mg Intravenous BID  . metoprolol tartrate  25 mg Oral BID  . pantoprazole  40 mg Oral q morning - 10a  . potassium chloride  20 mEq Oral BID   Continuous Infusions: . diltiazem (CARDIZEM) infusion Stopped (02/12/17 2159)    Principal Problem:   Atrial fibrillation with rapid ventricular response (HCC) Active Problems:   Essential hypertension   CAD, NATIVE VESSEL   Chest pain, unspecified   Hyperlipidemia   Pulmonary edema   Urgency of micturition   Critical Care Time spent: 31 mins  Irwin Brakeman, MD, FAAFP [facility] Pager [phone]  If 7PM-7AM, please contact night-coverage [email or web address] Password [email or web address] 02/13/2017, 6:39 AM    LOS: 1 day

## 2017-02-13 NOTE — Consult Note (Signed)
Cardiology Consultation:   Patient ID: Gabryel H Mooney-Riggs; [id number]; 08/30/1930   Admit date: 02/12/2017 Date of Consult: 02/13/2017  Primary Care Provider: [name], MD Primary Cardiologist: Dr. Harl Bowie  Chief Complaint: chest discomfort/palpitations  Patient Profile:   Brenda Hall is a 83 y.o. female with a hx of CAD (NSTEMI 12/2005 s/p BMS to Cx, prior normal LVEF), TIA/prior strokes (CVA seen on imaging), HLD, CKD stage III, anemia, IBS, HTN, PMR, mild carotid disease (<40% bilaterally in 2015)  who is being seen today for the evaluation of atrial fib/chest pain/elevated troponin at the request of Dr. Olevia Bowens  History of Present Illness:   At baseline, she is very active for her age. 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 She does deep water aerobics (higher intensity) 3x a week for an hour and did so the day prior to admission without any adverse symptoms or limitation whatsoever. She does report rare chest pains a few times a year but had not had anything recently like her prior anginal pain. Yesterday while cooking dinner around 6 pm she felt onset of palpitations associated with left sided chest discomfort radiating down her left arm. She says this felt different than prior angina. She took 2 SL NTG which made her feel worse. Aside from recent urinary incontinence for which she was due to see urology, she's been in her USOH. Due to persistent symptoms, she came to the ED where she was found to be in atrial fib RVR with some ST depression on  EKG. She was started on Cardizem and given IV Lasix. She converted to NSR last night after 11pm. CXR with possible mild interstitial edema. BNP 440. Troponin <0.03->0.23-0.25. Cr 1.44->1.49, Hgb 12.0. Blood pressure mildly elevated. She is currently asking when she can go home. She was on Aggrenox PTA. She denies any history of any bleeding trouble. She received 1 dose of IV Lovenox last  night. 2D echo pending. Diltiazem was stopped overnight.   Past Medical History:  Diagnosis Date  . Anemia   . CAD in native artery    a. NSTEMI 12/2005 s/p BMS to Cx, prior normal LVEF.  Marland Kitchen Cancer ([place]) 1988 and  2009   BCC nose and  Melanoma back  . Cancer of skin Jan. 2014   [facility] Forehead [facility] Nose  . Carotid stenosis    mild  . Chronic back pain   . Coronary atherosclerosis of unspecified type of vessel, native or graft   . DJD (degenerative joint disease) of knee   . History of TIA (transient ischemic attack)   . HTN (hypertension)   . IBS (irritable bowel syndrome)   . Myocardial infarction [facility])    5 yrs ago     dr wall  cardiac  . Osteoporosis   . Other diseases of lung, not elsewhere classified    lung nodule  . Polymyalgia rheumatica ([place])   . Shingles 01/2016-02/2016  . Stroke [facility])     Past Surgical History:  Procedure Laterality Date  . ABDOMINAL HYSTERECTOMY  1977   partial  . BACK SURGERY  1982  . CARDIAC CATHETERIZATION    . COLONOSCOPY N/A 01/27/2013   Procedure: COLONOSCOPY;  Surgeon: Rogene Houston, MD;  Location: [facility] ENDO [facility]ITE;  Service: Endoscopy;  Laterality: N/A;  1030  . JOINT REPLACEMENT  2001 and  2011   6 knee surgeries   bil replacement  . KNEE ARTHROSCOPY    . LUMBAR  LAMINECTOMY    . PR VEIN BYPASS GRAFT,AORTO-FEM-POP  2010  . removal of melanoma from back  2007  . SPINE SURGERY  2013   Broken back: fusion surgery  . tonisllectomy  1944  . total knee anthroplasty    . VERTEBROPLASTY  07/11/2011   Procedure: VERTEBROPLASTY;  Surgeon: Faythe Ghee, MD;  Location: [facility] ORS;  Service: Neurosurgery;  Laterality: N/A;  Vertebroplasty of Lumbar Two     Inpatient Medications: Scheduled Meds: . aspirin EC  81 mg Oral Daily  . atorvastatin  80 mg Oral QHS  . enoxaparin (LOVENOX) injection  1 mg/kg Subcutaneous Once  . estrogens (conjugated)  0.3 mg Oral QPM  . fluticasone  1 spray Each Nare Daily  . furosemide  40 mg Intravenous BID  .  metoprolol tartrate  25 mg Oral BID  . pantoprazole  40 mg Oral q morning - 10a  . potassium chloride  20 mEq Oral BID   Continuous Infusions: . diltiazem (CARDIZEM) infusion Stopped (02/12/17 2159)   PRN Meds: loperamide, nitroGLYCERIN, ondansetron **OR** ondansetron (ZOFRAN) IV, traZODone  Home Meds: Prior to Admission medications   Medication Sig Start Date End Date Taking? Authorizing Provider  aspirin 81 MG tablet Take 81 mg by mouth daily.   Yes [provider]  atorvastatin (LIPITOR) 80 MG tablet Take 1 tablet (80 mg total) by mouth at bedtime. 12/03/16  Yes [name], MD  Calcium Carbonate-Vit D-Min (CALCIUM 1200 PO) Take 1 tablet by mouth at bedtime.    Yes [provider]  dipyridamole-aspirin (AGGRENOX) 200-25 MG 12hr capsule Take 1 capsule by mouth 2 (two) times daily. 12/08/15  Yes [name], MD  estrogens, conjugated, (PREMARIN) 0.3 MG tablet Take 1 tablet (0.3 mg total) by mouth daily. Take daily for 21 days then do not take for 7 days. Patient taking differently: Take 0.3 mg by mouth every evening.  12/03/16  Yes [name], MD  fluticasone [facility]) 50 MCG/ACT nasal spray USE TWO SPRAYS IN EACH NOSTRIL ONCE DAILY 12/03/16  Yes [name], MD  loperamide (IMODIUM) 2 MG capsule Take by mouth as needed for diarrhea or loose stools.   Yes [provider]  metoprolol tartrate (LOPRESSOR) 25 MG tablet TAKE 1 TABLET TWICE A DAY 08/26/16  Yes Branch, Alphonse Guild, MD  nitroGLYCERIN (NITROSTAT) 0.4 MG SL tablet Place 1 tablet (0.4 mg total) under the tongue every 5 (five) minutes as needed for chest pain. 12/03/16  Yes [name], MD  pantoprazole (PROTONIX) 40 MG tablet Take 1 tablet (40 mg total) by mouth every morning. 11/12/16  Yes Deveshwar, Abel Presto, MD  Zinc 50 MG CAPS Take 50 mg by mouth daily.   Yes [provider]    Allergies:    Allergies  Allergen Reactions  . Epinephrine Other (See Comments)    During  dental procedure-caused facial swelling at injection site. Pt has had epinephrine since then for nose and facial surgery and did fine with it per pt.  . Clopidogrel Bisulfate Itching and Rash    Plavix  . Oxycodone     hallucinations    Social History:   Social History   Social History  . Marital status: Widowed    Spouse name: N/A  . Number of children: N/A  . Years of education: N/A   Occupational History  . Not on file.   Social History Main Topics  . Smoking status: Never Smoker  . Smokeless tobacco: Never Used  Comment: tobacco use - no  . Alcohol use 0.0 oz/week     Comment: occasionally a glass of wine or margarita per pt  . Drug use: No  . Sexual activity: Not on file   Other Topics Concern  . Not on file   Social History Narrative   Married, retired. 3 children. Regular exercise -yes; hobbies = swimming.    Family History:   The patient's family history includes Atrial fibrillation in her daughter; Cancer in her mother; Heart attack in her father; Heart disease in her father; Hypertension in her daughter, father, and son. There is no history of Colon cancer.  ROS:  Please see the history of present illness.  All other ROS reviewed and negative.     Physical Exam/Data:   Vitals:   02/13/17 0300 02/13/17 0400 02/13/17 0500 02/13/17 0800  BP: (!) 112/45 (!) 142/69 (!) 145/67   Pulse: (!) 57 62 64   Resp: 19 13 14    Temp:  98.5 F (36.9 C)  97.8 F (36.6 C)  TempSrc:  Oral  Oral  SpO2: 92% 97% 95%   Weight:   156 lb 15.5 oz (71.2 kg)   Height:        Intake/Output Summary (Last 24 hours) at 02/13/17 0927 Last data filed at 02/13/17 0500  Gross per 24 hour  Intake              530 ml  Output              300 ml  Net              230 ml   Filed Weights   02/12/17 1958 02/12/17 2324 02/13/17 0500  Weight: 156 lb (70.8 kg) 156 lb 15.5 oz (71.2 kg) 156 lb 15.5 oz (71.2 kg)   Body mass index is 27.81 kg/m.  General: Well developed, well  nourished, in no acute distress. Head: Normocephalic, atraumatic, sclera non-icteric, no xanthomas, nares are without discharge.  Neck: Negative for carotid bruits. JVD not elevated. Lungs: Clear bilaterally to auscultation without wheezes, rales, or rhonchi. Breathing is unlabored. Heart: RRR with S1 S2. No murmurs, rubs, or gallops appreciated. Abdomen: Soft, non-tender, non-distended with normoactive bowel sounds. No hepatomegaly. No rebound/guarding. No obvious abdominal masses. Msk:  Strength and tone appear normal for age. Extremities: No clubbing or cyanosis. No edema.  Distal pedal pulses are 2+ and equal bilaterally. Neuro: Alert and oriented X 3. No facial asymmetry. No focal deficit. Moves all extremities spontaneously. Psych:  Responds to questions appropriately with a normal affect.  EKG:  The EKG was personally reviewed and demonstrates atrial fib 157bpm, ST depression II, III, avF, V4-V6 subtle (question rate related), otherwise nonspecific changes  Relevant CV Studies: Referenced above. 2d echo pending  Laboratory Data:  Chemistry  Recent Labs Lab 02/12/17 2014 02/13/17 0744  NA 143 139  K 3.6 3.9  CL 106 101  CO2 25 30  GLUCOSE 133* 98  BUN 23* 25*  CREATININE 1.44* 1.49*  CALCIUM 9.2 9.0  GFRNONAA 32* 31*  GFRAA 37* 36*  ANIONGAP 12 8     Hematology  Recent Labs Lab 02/12/17 2014 02/13/17 0744  WBC 7.2 5.6  RBC 3.81* 3.78*  HGB 12.1 12.0  HCT 37.5 36.9  MCV 98.4 97.6  MCH 31.8 31.7  MCHC 32.3 32.5  RDW 13.2 13.4  PLT 327 295   Cardiac Enzymes  Recent Labs Lab 02/12/17 2014 02/13/17 0146 02/13/17 0744  TROPONINI <0.03 0.23*  0.25*   No results for input(s): TROPIPOC in the last 168 hours.  BNP  Recent Labs Lab 02/12/17 2014  BNP 440.0*    Radiology/Studies:  Dg Chest Port 1 View  Result Date: 02/13/2017 CLINICAL DATA:  Pulmonary edema EXAM: PORTABLE CHEST 1 VIEW COMPARISON:  Chest radiograph from one day prior. FINDINGS: Stable  cardiomediastinal silhouette with mild cardiomegaly and aortic atherosclerosis. No pneumothorax. No pleural effusion. Stable borderline mild pulmonary edema. Mild scarring versus atelectasis at the left costophrenic angle. IMPRESSION: 1. Stable mild cardiomegaly with borderline mild pulmonary edema. 2. Mild left basilar scarring versus atelectasis. Electronically Signed   By: Ilona Sorrel M.D.   On: 02/13/2017 08:30   Dg Chest Portable 1 View  Result Date: 02/12/2017 CLINICAL DATA:  Acute chest pain and tachycardia today. EXAM: PORTABLE CHEST 1 VIEW COMPARISON:  07/10/2011 and prior exams FINDINGS: The cardiomediastinal silhouette is unremarkable. Pulmonary vascular congestion and mild interstitial opacities noted. No pleural effusion or pneumothorax noted. No acute bony abnormalities are present. IMPRESSION: Pulmonary vascular congestion and probable mild interstitial pulmonary edema. Electronically Signed   By: Margarette Canada M.D.   On: 02/12/2017 20:19    Assessment and Plan:   1. New onset atrial fibrillation with rapid ventricular response - new finding for patient. CHADSVASC is at least 7 for HTN, age, stroke, female, vascular disease, possibly 8 if you count possible CHF this admission given pulm edema on CXR. Will discontinue Aggrenox and start Xarelto. I ordered per pharmacy to verify timing of correct dose and provide education. Her creatinine puts her right on the border for the 2.5/5mg  Eliquis dose but she is for certain a candidate for the lower dose Xarelto due to reduced creatinine clearance. Await echocardiogram. Will increase metoprolol to 50mg  BID. Have discontinued dilt drip order as she is no longer on this. Add TSH to labs. Unclear what precipitated atrial fib but if this recurs, may need to consider antiarrhythmic drug. Echo will help [place] see her left atrial size to determine her risk of recurrent arrhythmia.  2. Chest discomfort/elevated troponin with remote history of CAD s/p PCI 2007  - will review with Dr. Bronson Ing. Initial troponin negative but 2 values since that time relatively flat in the 0.2 range. Increase beta blocker. Continue statin and aspirin for now. She is insistent she "did not have a heart attack" as it did not feel like prior angina. She was able to exercise the day prior to yesterday (impressively with deep water aerobics) without any recent anginal symptoms. Will await echocardiogram to assess LVEF and wall motion to determine whether further ischemic w/u is necessary. However, given her description of symptoms, this sounds more like a demand process. F/u one more troponin this afternoon.  3. Possible acute CHF (? Diastolic - LVEF pending) - received IV Lasix last night and this AM. Clinically appears improved. Will dc further IV Lasix, await LVEF and follow clinically.  4. CKD stage III - baseline Cr appears 1.2-1.4, follow. Slight bump this AM after tachy and IV Lasix.   5. Essential HTN - follow BP with increase in metoprolol.  The above plan was reviewed with Dr. Bronson Ing who is in agreement. No dc today given troponins and pending echo, but have tentatively arranged f/u 11/2 at 2pm with Arnold Long.  Signed, Charlie Pitter, PA-C  02/13/2017 9:27 AM  The patient was seen and examined, and I agree with the history, physical exam, assessment and plan as documented above, with modifications as noted below.  I have also personally reviewed all relevant documentation, old records, labs, and both radiographic and cardiovascular studies. I have also independently interpreted old and new ECG's.  Very delightful 83 yr old woman admitted with chest discomfort and found to be in new-onset rapid atrial fibrillation, who has since converted to sinus rhythm.  Troponins elevated as noted above. She is very clear that these symptoms were markedly different than prior anginal symptoms.  She is currently asymptomatic. Physical exam demonstrates regular rate and rhythm,  normal S1S2, no S3/S4, no murmurs/rubs/gallops. Lungs are clear. No leg edema.  Metoprolol dose increased to 50 mg bid. She is concerned about feeling lethargic on this higher dose, as she likes to be very active doing water aerobics 3 times per week. 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 Her work has been displayed here at [place] with renally dosed Xarelto and discontinuation of Aggrenox.  Continue ASA and statin for CAD.  BP should be monitored given higher dose of metoprolol. Given CKD and h/o CAD, an ACEI would be beneficial if tolerated and K remains normal (5.5 two months ago).  TSH was normal.  I will review echocardiogram. Afterwards, a determination on whether or not she requires a diuretic at time of discharge, which is hopefully on 02/14/17.   Kate Sable, MD, [facility]  02/13/2017 10:55 PM

## 2017-02-13 NOTE — Progress Notes (Signed)
ANTICOAGULATION CONSULT NOTE - Initial Consult  Pharmacy Consult for XARELTO Indication: atrial fibrillation  Allergies  Allergen Reactions  . Epinephrine Other (See Comments)    During dental procedure-caused facial swelling at injection site. Pt has had epinephrine since then for nose and facial surgery and did fine with it per pt.  . Clopidogrel Bisulfate Itching and Rash    Plavix  . Oxycodone     hallucinations   Patient Measurements: Height: 5\' 3"  (160 cm) Weight: 156 lb 15.5 oz (71.2 kg) IBW/kg (Calculated) : 52.4  Vital Signs: Temp: 97.8 F (36.6 C) (10/25 0800) Temp Source: Oral (10/25 0800) BP: 145/67 (10/25 0500) Pulse Rate: 64 (10/25 0500)  Labs:  Recent Labs  02/12/17 2014 02/13/17 0146 02/13/17 0744  HGB 12.1  --  12.0  HCT 37.5  --  36.9  PLT 327  --  295  CREATININE 1.44*  --  1.49*  TROPONINI <0.03 0.23* 0.25*   Estimated Creatinine Clearance: 26.6 mL/min (A) (by C-G formula based on SCr of 1.49 mg/dL (H)).  Medical History: Past Medical History:  Diagnosis Date  . Anemia   . CAD in native artery    a. NSTEMI 12/2005 s/p BMS to Cx, prior normal LVEF.  Marland Kitchen Cancer ([place]) 1988 and  2009   BCC nose and  Melanoma back  . Cancer of skin Jan. 2014   [facility]head [facility] Nose  . Carotid stenosis    mild  . Chronic back pain   . CKD (chronic kidney disease), stage III ([place])   . Coronary atherosclerosis of unspecified type of vessel, native or graft   . DJD (degenerative joint disease) of knee   . History of TIA (transient ischemic attack)   . HTN (hypertension)   . IBS (irritable bowel syndrome)   . Myocardial infarction [facility])    5 yrs ago     dr wall  cardiac  . Osteoporosis   . Other diseases of lung, not elsewhere classified    lung nodule  . Polymyalgia rheumatica ([place])   . Shingles 01/2016-02/2016  . Stroke [facility])    Medications:  Prescriptions Prior to Admission  Medication Sig Dispense Refill Last Dose  . aspirin 81 MG tablet Take 81 mg by  mouth daily.   02/11/2017 at Unknown time  . atorvastatin (LIPITOR) 80 MG tablet Take 1 tablet (80 mg total) by mouth at bedtime. 90 tablet 1 02/11/2017 at Unknown time  . Calcium Carbonate-Vit D-Min (CALCIUM 1200 PO) Take 1 tablet by mouth at bedtime.    02/11/2017 at Unknown time  . dipyridamole-aspirin (AGGRENOX) 200-25 MG 12hr capsule Take 1 capsule by mouth 2 (two) times daily. 60 capsule 0 02/12/2017 at 1200  . estrogens, conjugated, (PREMARIN) 0.3 MG tablet Take 1 tablet (0.3 mg total) by mouth daily. Take daily for 21 days then do not take for 7 days. (Patient taking differently: Take 0.3 mg by mouth every evening. ) 90 tablet 3 02/11/2017 at Unknown time  . fluticasone (FLONASE) 50 MCG/ACT nasal spray USE TWO SPRAYS IN EACH NOSTRIL ONCE DAILY 16 g 5 02/12/2017 at Unknown time  . loperamide (IMODIUM) 2 MG capsule Take by mouth as needed for diarrhea or loose stools.   unknown  . metoprolol tartrate (LOPRESSOR) 25 MG tablet TAKE 1 TABLET TWICE A DAY 180 tablet 2 02/12/2017 at 1200  . nitroGLYCERIN (NITROSTAT) 0.4 MG SL tablet Place 1 tablet (0.4 mg total) under the tongue every 5 (five) minutes as needed for chest pain. [place]  tablet 0 02/12/2017 at 1800  . pantoprazole (PROTONIX) 40 MG tablet Take 1 tablet (40 mg total) by mouth every morning. 90 tablet 1 02/12/2017 at Unknown time  . Zinc 50 MG CAPS Take 50 mg by mouth daily.   02/12/2017 at Unknown time   Assessment: 83 y.o. female with a hx of CAD (NSTEMI 12/2005 s/p BMS to Cx, prior normal LVEF), TIA/prior strokes (CVA seen on imaging), HLD, CKD stage III, anemia, IBS, HTN, PMR, mild carotid disease (<40% bilaterally in 2015)  who is being seen today for the evaluation of atrial fib/chest pain/elevated troponin.  Asked to initiate Xarelto for afib.  Goal of Therapy:  Anticoagulation for stroke prevention Monitor platelets by anticoagulation protocol: Yes   Plan:  Xarelto 15mg  po daily (renally adjusted) Provide education  Hart Robinsons  A 02/13/2017,10:24 AM

## 2017-02-13 NOTE — Progress Notes (Signed)
Lab called critical troponin of 0.23 at 0237. MD Olevia Bowens notified via text page at [phone]

## 2017-02-13 NOTE — Progress Notes (Addendum)
CRITICAL VALUE ALERT  Critical Value:  Troponin 0.25  Date & Time Notied:  02/13/17 2060  Provider Notified: Melina Copa, PA-C  Orders Received/Actions taken: None

## 2017-02-13 NOTE — Discharge Instructions (Addendum)
Information on my medicine - XARELTO (Rivaroxaban)  This medication education was reviewed with me or my healthcare representative as part of my discharge preparation.  Why was Xarelto prescribed for you? Xarelto was prescribed for you to reduce the risk of a blood clot forming that can cause a stroke if you have a medical condition called atrial fibrillation (a type of irregular heartbeat).  What do you need to know about xarelto ? Take your Xarelto ONCE DAILY at the same time every day with your evening meal. If you have difficulty swallowing the tablet whole, you may crush it and mix in applesauce just prior to taking your dose.  Take Xarelto exactly as prescribed by your doctor and DO NOT stop taking Xarelto without talking to the doctor who prescribed the medication.  Stopping without other stroke prevention medication to take the place of Xarelto may increase your risk of developing a clot that causes a stroke.  Refill your prescription before you run out.  After discharge, you should have regular check-up appointments with your healthcare provider that is prescribing your Xarelto.  In the future your dose may need to be changed if your kidney function or weight changes by a significant amount.  What do you do if you miss a dose? If you are taking Xarelto ONCE DAILY and you miss a dose, take it as soon as you remember on the same day then continue your regularly scheduled once daily regimen the next day. Do not take two doses of Xarelto at the same time or on the same day.   Important Safety Information A possible side effect of Xarelto is bleeding. You should call your healthcare provider right away if you experience any of the following: ? Bleeding from an injury or your nose that does not stop. ? Unusual colored urine (red or dark brown) or unusual colored stools (red or black). ? Unusual bruising for unknown reasons. ? A serious fall or if you hit your head (even if there  is no bleeding).  Some medicines may interact with Xarelto and might increase your risk of bleeding while on Xarelto. To help avoid this, consult your healthcare provider or pharmacist prior to using any new prescription or non-prescription medications, including herbals, vitamins, non-steroidal anti-inflammatory drugs (NSAIDs) and supplements.  This website has more information on Xarelto: [email or web address]     Follow with Primary MD  [name], MD  and other consultant's as instructed your Hospitalist MD  Please get a complete blood count and chemistry panel checked by your Primary MD at your next visit, and again as instructed by your Primary MD.  Get Medicines reviewed and adjusted: Please take all your medications with you for your next visit with your Primary MD  Laboratory/radiological data: Please request your Primary MD to go over all hospital tests and procedure/radiological results at the follow up, please ask your Primary MD to get all Hospital records sent to his/her office.  In some cases, they will be blood work, cultures and biopsy results pending at the time of your discharge. Please request that your primary care M.D. follows up on these results.  Also Note the following: If you experience worsening of your admission symptoms, develop shortness of breath, life threatening emergency, suicidal or homicidal thoughts you must seek medical attention immediately by calling 911 or calling your MD immediately  if symptoms less severe.  You must read complete instructions/literature along with all the possible adverse reactions/side effects for all the Medicines you  take and that have been prescribed to you. Take any new Medicines after you have completely understood and accpet all the possible adverse reactions/side effects.   Do not drive when taking Pain medications or sleeping medications (Benzodaizepines)  Do not take more than prescribed Pain, Sleep and Anxiety  Medications. It is not advisable to combine anxiety,sleep and pain medications without talking with your primary care practitioner  Special Instructions: If you have smoked or chewed Tobacco  in the last 2 yrs please stop smoking, stop any regular Alcohol  and or any Recreational drug use.  Wear Seat belts while driving.  Please note: You were cared for by a hospitalist during your hospital stay. Once you are discharged, your primary care physician will handle any further medical issues. Please note that NO REFILLS for any discharge medications will be authorized once you are discharged, as it is imperative that you return to your primary care physician (or establish a relationship with a primary care physician if you do not have one) for your post hospital discharge needs so that they can reassess your need for medications and monitor your lab values.

## 2017-02-13 NOTE — Progress Notes (Signed)
*  PRELIMINARY RESULTS* Echocardiogram 2D Echocardiogram has been performed.  Leavy Cella 02/13/2017, 2:28 PM

## 2017-02-13 NOTE — Progress Notes (Signed)
Transferred to 304 in stable condition via w/c, reported to B. Foley, Therapist, sports.

## 2017-02-14 ENCOUNTER — Telehealth: Payer: Self-pay | Admitting: Family Medicine

## 2017-02-14 DIAGNOSIS — I25118 Atherosclerotic heart disease of native coronary artery with other forms of angina pectoris: Secondary | ICD-10-CM

## 2017-02-14 LAB — CBC
HCT: 37.5 % (ref 36.0–46.0)
HEMOGLOBIN: 11.9 g/dL — AB (ref 12.0–15.0)
MCH: 31.4 pg (ref 26.0–34.0)
MCHC: 31.7 g/dL (ref 30.0–36.0)
MCV: 98.9 fL (ref 78.0–100.0)
Platelets: 320 10*3/uL (ref 150–400)
RBC: 3.79 MIL/uL — AB (ref 3.87–5.11)
RDW: 13.4 % (ref 11.5–15.5)
WBC: 6.7 10*3/uL (ref 4.0–10.5)

## 2017-02-14 LAB — BASIC METABOLIC PANEL
Anion gap: 9 (ref 5–15)
BUN: 28 mg/dL — AB (ref 6–20)
CHLORIDE: 103 mmol/L (ref 101–111)
CO2: 27 mmol/L (ref 22–32)
Calcium: 8.6 mg/dL — ABNORMAL LOW (ref 8.9–10.3)
Creatinine, Ser: 1.55 mg/dL — ABNORMAL HIGH (ref 0.44–1.00)
GFR calc non Af Amer: 30 mL/min — ABNORMAL LOW (ref >60)
GFR, EST AFRICAN AMERICAN: 34 mL/min — AB (ref >60)
Glucose, Bld: 97 mg/dL (ref 65–99)
POTASSIUM: 4.2 mmol/L (ref 3.5–5.1)
SODIUM: 139 mmol/L (ref 135–145)

## 2017-02-14 LAB — ECHOCARDIOGRAM COMPLETE
Height: 63 in
Weight: 2511.48 oz

## 2017-02-14 LAB — TROPONIN I: TROPONIN I: 0.11 ng/mL — AB (ref <0.03)

## 2017-02-14 MED ORDER — METOPROLOL TARTRATE 50 MG PO TABS: 50.0000 mg | ORAL_TABLET | Freq: Two times a day (BID) | ORAL | 0 refills | Status: DC

## 2017-02-14 MED ORDER — RIVAROXABAN 15 MG PO TABS: 15.0000 mg | ORAL_TABLET | Freq: Every day | ORAL | 0 refills | Status: DC

## 2017-02-14 MED ORDER — ESTROGENS CONJUGATED 0.3 MG PO TABS: 0.3000 mg | ORAL_TABLET | Freq: Every evening | ORAL | Status: DC

## 2017-02-14 NOTE — Discharge Summary (Addendum)
Physician Discharge Summary  Brenda Hall GUR:[id number] DOB: Jul 02, 1930 DOA: 02/12/2017  PCP: [name], MD Cardiologist:  Dr. Harl Bowie  Admit date: 02/12/2017 Discharge date: 02/14/2017  Admitted From: Home  Disposition: Home   Recommendations for Outpatient Follow-up:  1. Follow up with PCP in 1 weeks 2. Follow up with cardiology on 11/2 as scheduled 3. Please obtain BMP/CBC in one week  Discharge Condition: STABLE   CODE STATUS: FULL    Brief Hospitalization Summary: Please see all hospital notes, images, labs for full details of the hospitalization. HPI: Brenda Hall is a 83 y.o. female with medical history significant of anemia, BCC of nose and melanoma of the back, mild carotid stenosis, CAD, history of MI, unspecified CHF, chronic back pain, osteoarthritis, IBS, hypertension, osteoporosis, lung nodule, polymyalgia rheumatica (currently not active), history of herpes zoster, history of TIA, history of CVA who is coming to the emergency department with complaints of weakness, palpitations, chest pressure and fatigue since earlier today.  Per patient, she went to her water aerobics for an hour this morning and did not have any symptoms. She mentions that she has normally goes to these pool exercises 3 times a week and does not have any trouble. The rest of the day was going as usual, until earlier this evening she began feeling palpitations, weakness, shortness of breath and chest pressure, radiates to her left shoulder and arm, similar to when she had her previous MI. She took 2 sublingual nitroglycerin without any changes in her chest discomfort, but noticed that her palpitations got worse. She denies fever, chills, dizziness, nausea or emesis. No PND, orthopnea or recent pitting edema of the lower extremities. She denies abdominal pain, diarrhea, constipation, melena or hematochezia. She has been experiencing urinary urgency for over a week, and was scheduled to see  the urologist tomorrow morning. No dysuria or hematuria.  ED Course: Initial vital signs in the emergency department temperature 97.7F, pulse ox 150, blood pressure 136/79 mmHg, respirations 24 and O2 sat 95% on room air. She was given Cardizem 10 mg IVP bolus dose, which was followed by a Cardizem infusion to control her heart rate. Also furosemide 40 mg IVP 1.  Her lab work shows an EKG showing A. fib with RVR at 157 BPM with ST depression, negative troponin, BNP 440 pg/mL. She has a pending urinalysis, unremarkable CBC. Chemistry shows a sodium 143, potassium 3.6, chloride 106, bicarbonate 25 mmol/L. BUN was 23, creatinine 1.44 and glucose 133 mg/dL. Her chest radiograph shows pulmonary vascular congestion with mild interstitial pulmonary edema   Brief Admission Hx: Brenda H Mooney-Riggsis a 83 y.o.femalewith medical history significant of anemia, BCC of nose and melanoma of the back, mild carotid stenosis, CAD, history of MI, unspecified CHF, chronic back pain, osteoarthritis, IBS, hypertension, osteoporosis, lung nodule, polymyalgia rheumatica (currently not active), history of herpes zoster, history of TIA, history of CVA who is coming to the emergency department with complaints of weakness, palpitations, chest pressure and fatigue   MDM/Assessment & Plan:   1. Chronic Atrial Fibrillation with RVR - resolved rapidly with IV diltiazem, now off drip, HR 60s, asymptomatic now.  I asked for cardiology to evaluate.  She is followed by Dr. Harl Bowie.  Her CHADVASC is 8. She was started on xarelto 15 mg daily and metoprolol was increased to 50 mg BID.   2. Acute CHF exacerbation with pulmonary edema - Pt says she feels better after IV lasix given.  She has been voiding most of night.  Echo with normal EF and grade 1 DD.  Diuretics not recommended by cardiology.   3. Chest Pain with elevated troponin - AM EKG reviewed, ?demand ischemia from [facility],  cardiology consulted.  Trended troponins.   4. CAD - as above.  5. Essential Hypertension -  Metoprolol 50 mg BID. Consider adding ACEI on outpatient basis.   6. Dyslipidemia - resumed home statin.  7. Urinary Urgency - chronic problem, normal urinalysis, follow up with urology outpatient.   8. Generalized Weakness - symptoms seem to have resolved now, likely was related to Afib RVR.    DVT prophylaxis: xarelto Code Status: Full Family Communication:  Disposition Plan: Home   Consultants:  cardiology  Procedures:  Echocardiogram  Discharge Diagnoses:  Principal Problem:   Atrial fibrillation with rapid ventricular response ([place]) Active Problems:   Essential hypertension   CAD, NATIVE VESSEL   Chest pain, unspecified   Hyperlipidemia   Pulmonary edema   Urgency of micturition  Discharge Instructions: Discharge Instructions    Call MD for:  difficulty breathing, headache or visual disturbances    Complete by:  As directed    Call MD for:  extreme fatigue    Complete by:  As directed    Call MD for:  hives    Complete by:  As directed    Call MD for:  persistant dizziness or light-headedness    Complete by:  As directed    Call MD for:  persistant nausea and vomiting    Complete by:  As directed    Call MD for:  severe uncontrolled pain    Complete by:  As directed    Call MD for:  temperature >100.4    Complete by:  As directed    Increase activity slowly    Complete by:  As directed      Allergies as of 02/14/2017      Reactions   Epinephrine Other (See Comments)   During dental procedure-caused facial swelling at injection site. Pt has had epinephrine since then for nose and facial surgery and did fine with it per pt.   Clopidogrel Bisulfate Itching, Rash   Plavix   Oxycodone    hallucinations      Medication List    STOP taking these medications   dipyridamole-aspirin 200-25 MG 12hr capsule Commonly known as:  AGGRENOX     TAKE these medications   aspirin 81 MG tablet Take 81 mg by  mouth daily.   atorvastatin 80 MG tablet Commonly known as:  LIPITOR Take 1 tablet (80 mg total) by mouth at bedtime.   CALCIUM 1200 PO Take 1 tablet by mouth at bedtime.   estrogens (conjugated) 0.3 MG tablet Commonly known as:  PREMARIN Take 1 tablet (0.3 mg total) by mouth every evening.   fluticasone 50 MCG/ACT nasal spray Commonly known as:  FLONASE USE TWO SPRAYS IN EACH NOSTRIL ONCE DAILY   loperamide 2 MG capsule Commonly known as:  IMODIUM Take by mouth as needed for diarrhea or loose stools.   metoprolol tartrate 50 MG tablet Commonly known as:  LOPRESSOR Take 1 tablet (50 mg total) by mouth 2 (two) times daily. What changed:  medication strength  See the new instructions.   nitroGLYCERIN 0.4 MG SL tablet Commonly known as:  NITROSTAT Place 1 tablet (0.4 mg total) under the tongue every 5 (five) minutes as needed for chest pain.   pantoprazole 40 MG tablet Commonly known as:  PROTONIX Take 1 tablet (40 mg total)  by mouth every morning.   Rivaroxaban 15 MG Tabs tablet Commonly known as:  XARELTO Take 1 tablet (15 mg total) by mouth daily with supper.   Zinc 50 MG Caps Take 50 mg by mouth daily.      Follow-up Information    Lendon Colonel, NP Follow up.   Specialties:  Nurse Practitioner, Radiology, Cardiology Why:  [facility] [place] - 11/2 as listed below. Curt Bears is one of the NPs that works with the cardiology team. Contact information: [place] [address] [phone]        [name], MD. Schedule an appointment as soon as possible for a visit in 2 week(s).   Specialty:  Family Medicine Why:  Hospital Follow Up  Contact information: [place] [address] [phone]          Allergies  Allergen Reactions  . Epinephrine Other (See Comments)    During dental procedure-caused facial swelling at injection site. Pt has had epinephrine since then for nose and facial surgery  and did fine with it per pt.  . Clopidogrel Bisulfate Itching and Rash    Plavix  . Oxycodone     hallucinations   Current Discharge Medication List    START taking these medications   Details  Rivaroxaban (XARELTO) 15 MG TABS tablet Take 1 tablet (15 mg total) by mouth daily with supper. Qty: 30 tablet, Refills: 0      CONTINUE these medications which have CHANGED   Details  estrogens, conjugated, (PREMARIN) 0.3 MG tablet Take 1 tablet (0.3 mg total) by mouth every evening.    metoprolol tartrate (LOPRESSOR) 50 MG tablet Take 1 tablet (50 mg total) by mouth 2 (two) times daily. Qty: 60 tablet, Refills: 0      CONTINUE these medications which have NOT CHANGED   Details  aspirin 81 MG tablet Take 81 mg by mouth daily.    atorvastatin (LIPITOR) 80 MG tablet Take 1 tablet (80 mg total) by mouth at bedtime. Qty: 90 tablet, Refills: 1    Calcium Carbonate-Vit D-Min (CALCIUM 1200 PO) Take 1 tablet by mouth at bedtime.     fluticasone (FLONASE) 50 MCG/ACT nasal spray USE TWO SPRAYS IN EACH NOSTRIL ONCE DAILY Qty: 16 g, Refills: 5    loperamide (IMODIUM) 2 MG capsule Take by mouth as needed for diarrhea or loose stools.    nitroGLYCERIN (NITROSTAT) 0.4 MG SL tablet Place 1 tablet (0.4 mg total) under the tongue every 5 (five) minutes as needed for chest pain. Qty: 30 tablet, Refills: 0    pantoprazole (PROTONIX) 40 MG tablet Take 1 tablet (40 mg total) by mouth every morning. Qty: 90 tablet, Refills: 1    Zinc 50 MG CAPS Take 50 mg by mouth daily.      STOP taking these medications     dipyridamole-aspirin (AGGRENOX) 200-25 MG 12hr capsule         Procedures/Studies: Dg Chest Port 1 View  Result Date: 02/13/2017 CLINICAL DATA:  Pulmonary edema EXAM: PORTABLE CHEST 1 VIEW COMPARISON:  Chest radiograph from one day prior. FINDINGS: Stable cardiomediastinal silhouette with mild cardiomegaly and aortic atherosclerosis. No pneumothorax. No pleural effusion. Stable  borderline mild pulmonary edema. Mild scarring versus atelectasis at the left costophrenic angle. IMPRESSION: 1. Stable mild cardiomegaly with borderline mild pulmonary edema. 2. Mild left basilar scarring versus atelectasis. Electronically Signed   By: Ilona Sorrel M.D.   On: 02/13/2017 08:30   Dg Chest  Portable 1 View  Result Date: 02/12/2017 CLINICAL DATA:  Acute chest pain and tachycardia today. EXAM: PORTABLE CHEST 1 VIEW COMPARISON:  07/10/2011 and prior exams FINDINGS: The cardiomediastinal silhouette is unremarkable. Pulmonary vascular congestion and mild interstitial opacities noted. No pleural effusion or pneumothorax noted. No acute bony abnormalities are present. IMPRESSION: Pulmonary vascular congestion and probable mild interstitial pulmonary edema. Electronically Signed   By: Margarette Canada M.D.   On: 02/12/2017 20:19      Subjective: Pt really wants to go home.  She has tolerated higher dose of metoprolol.   Discharge Exam: Vitals:   02/13/17 2135 02/14/17 0500  BP: (!) 149/58 (!) 145/48  Pulse: 65 62  Resp: 18 18  Temp: 98 F (36.7 C) 98.2 F (36.8 C)  SpO2: 97% 96%   Vitals:   02/13/17 1224 02/13/17 1330 02/13/17 2135 02/14/17 0500  BP:   (!) 149/58 (!) 145/48  Pulse:  67 65 62  Resp:  18 18 18   Temp: 97.6 F (36.4 C)  98 F (36.7 C) 98.2 F (36.8 C)  TempSrc: Oral  Oral Oral  SpO2:  100% 97% 96%  Weight:    66.1 kg (145 lb 12.8 oz)  Height:       General: Pt is alert, awake, not in acute distress Cardiovascular: RRR, S1/S2 +, no rubs, no gallops Respiratory: CTA bilaterally, no wheezing, no rhonchi Abdominal: Soft, NT, ND, bowel sounds + Extremities: no edema, no cyanosis   The results of significant diagnostics from this hospitalization (including imaging, microbiology, ancillary and laboratory) are listed below for reference.     Microbiology: Recent Results (from the past 240 hour(s))  MRSA PCR Screening     Status: None   Collection Time: 02/12/17  11:07 PM  Result Value Ref Range Status   MRSA by PCR NEGATIVE NEGATIVE Final    Comment:        The GeneXpert MRSA Assay (FDA approved for NASAL specimens only), is one component of a comprehensive MRSA colonization surveillance program. It is not intended to diagnose MRSA infection nor to guide or monitor treatment for MRSA infections.      Labs: BNP (last 3 results)  Recent Labs  02/12/17 2014  BNP 332.9*   Basic Metabolic Panel:  Recent Labs Lab 02/12/17 2014 02/13/17 0744 02/14/17 0510  NA 143 139 139  K 3.6 3.9 4.2  CL 106 101 103  CO2 25 30 27   GLUCOSE 133* 98 97  BUN 23* 25* 28*  CREATININE 1.44* 1.49* 1.55*  CALCIUM 9.2 9.0 8.6*   Liver Function Tests: No results for input(s): AST, ALT, ALKPHOS, BILITOT, PROT, ALBUMIN in the last 168 hours. No results for input(s): LIPASE, AMYLASE in the last 168 hours. No results for input(s): AMMONIA in the last 168 hours. CBC:  Recent Labs Lab 02/12/17 2014 02/13/17 0744 02/14/17 0510  WBC 7.2 5.6 6.7  NEUTROABS  --  2.9  --   HGB 12.1 12.0 11.9*  HCT 37.5 36.9 37.5  MCV 98.4 97.6 98.9  PLT 327 295 320   Cardiac Enzymes:  Recent Labs Lab 02/12/17 2014 02/13/17 0146 02/13/17 0744 02/13/17 1419 02/14/17 0510  TROPONINI <0.03 0.23* 0.25* 0.19* 0.11*   BNP: Invalid input(s): POCBNP CBG: No results for input(s): GLUCAP in the last 168 hours. D-Dimer No results for input(s): DDIMER in the last 72 hours. Hgb A1c No results for input(s): HGBA1C in the last 72 hours. Lipid Profile No results for input(s): CHOL, HDL, LDLCALC, TRIG, CHOLHDL,  LDLDIRECT in the last 72 hours. Thyroid function studies  Recent Labs  02/13/17 0744  TSH 1.614   Anemia work up No results for input(s): VITAMINB12, FOLATE, FERRITIN, TIBC, IRON, RETICCTPCT in the last 72 hours. Urinalysis    Component Value Date/Time   COLORURINE STRAW (A) 02/13/2017 0030   APPEARANCEUR CLEAR 02/13/2017 0030   LABSPEC 1.004 (L)  02/13/2017 0030   PHURINE 5.0 02/13/2017 0030   GLUCOSEU NEGATIVE 02/13/2017 0030   HGBUR NEGATIVE 02/13/2017 0030   BILIRUBINUR NEGATIVE 02/13/2017 0030   KETONESUR NEGATIVE 02/13/2017 0030   PROTEINUR NEGATIVE 02/13/2017 0030   UROBILINOGEN 0.2 07/28/2008 1156   NITRITE NEGATIVE 02/13/2017 0030   LEUKOCYTESUR NEGATIVE 02/13/2017 0030   Sepsis Labs Invalid input(s): PROCALCITONIN,  WBC,  LACTICIDVEN Microbiology Recent Results (from the past 240 hour(s))  MRSA PCR Screening     Status: None   Collection Time: 02/12/17 11:07 PM  Result Value Ref Range Status   MRSA by PCR NEGATIVE NEGATIVE Final    Comment:        The GeneXpert MRSA Assay (FDA approved for NASAL specimens only), is one component of a comprehensive MRSA colonization surveillance program. It is not intended to diagnose MRSA infection nor to guide or monitor treatment for MRSA infections.     Time coordinating discharge: 33 mins  SIGNED:  Irwin Brakeman, MD  [facility] 02/14/2017, 11:29 AM Pager [phone]  If 7PM-7AM, please contact night-coverage [email or web address] Password [email or web address]

## 2017-02-14 NOTE — Telephone Encounter (Signed)
Patient had an attack of A-Fib and it currently in the hospital.  She is being released today.  She wanted to inform Dr. Richardson Landry.

## 2017-02-14 NOTE — Care Management Important Message (Signed)
Important Message  Patient Details  Name: Brenda Hall MRN: [id number] Date of Birth: February 24, 1931   Medicare Important Message Given:  Yes    Eara Burruel, Chauncey Reading, RN 02/14/2017, 9:56 AM

## 2017-02-14 NOTE — Progress Notes (Signed)
Progress Note  Patient Name: Brenda Hall Date of Encounter: 02/14/2017  Primary Cardiologist: Dr. Harl Bowie  Subjective   Feeling great. No CP or SOB, wants to go home.  Inpatient Medications    Scheduled Meds: . aspirin EC  81 mg Oral Daily  . atorvastatin  80 mg Oral QHS  . estrogens (conjugated)  0.3 mg Oral QPM  . fluticasone  1 spray Each Nare Daily  . metoprolol tartrate  50 mg Oral BID  . pantoprazole  40 mg Oral q morning - 10a  . rivaroxaban  15 mg Oral Daily  . sodium chloride flush  3 mL Intravenous Q12H   Continuous Infusions: . sodium chloride     PRN Meds: sodium chloride, loperamide, nitroGLYCERIN, ondansetron **OR** ondansetron (ZOFRAN) IV, sodium chloride flush, traZODone   Vital Signs    Vitals:   02/13/17 1224 02/13/17 1330 02/13/17 2135 02/14/17 0500  BP:   (!) 149/58 (!) 145/48  Pulse:  67 65 62  Resp:  18 18 18   Temp: 97.6 F (36.4 C)  98 F (36.7 C) 98.2 F (36.8 C)  TempSrc: Oral  Oral Oral  SpO2:  100% 97% 96%  Weight:    145 lb 12.8 oz (66.1 kg)  Height:        Intake/Output Summary (Last 24 hours) at 02/14/17 0933 Last data filed at 02/14/17 0319  Gross per 24 hour  Intake           376.15 ml  Output                0 ml  Net           376.15 ml   Filed Weights   02/12/17 2324 02/13/17 0500 02/14/17 0500  Weight: 156 lb 15.5 oz (71.2 kg) 156 lb 15.5 oz (71.2 kg) 145 lb 12.8 oz (66.1 kg)    Telemetry    NSR rare HR 49 overnight - Personally Reviewed   Physical Exam   GEN: No acute distress.  HEENT: Normocephalic, atraumatic, sclera non-icteric. Neck: No JVD or bruits. Cardiac: RRR no murmurs, rubs, or gallops.  Radials/DP/PT 1+ and equal bilaterally.  Respiratory: Clear to auscultation bilaterally. Breathing is unlabored. GI: Soft, nontender, non-distended, BS +x 4. MS: no deformity. Extremities: No clubbing or cyanosis. No edema. Distal pedal pulses are 2+ and equal bilaterally. Neuro:  AAOx3. Follows  commands. Psych:  Responds to questions appropriately with a normal affect.  Labs    Chemistry Recent Labs Lab 02/12/17 2014 02/13/17 0744 02/14/17 0510  NA 143 139 139  K 3.6 3.9 4.2  CL 106 101 103  CO2 25 30 27   GLUCOSE 133* 98 97  BUN 23* 25* 28*  CREATININE 1.44* 1.49* 1.55*  CALCIUM 9.2 9.0 8.6*  GFRNONAA 32* 31* 30*  GFRAA 37* 36* 34*  ANIONGAP 12 8 9      Hematology Recent Labs Lab 02/12/17 2014 02/13/17 0744 02/14/17 0510  WBC 7.2 5.6 6.7  RBC 3.81* 3.78* 3.79*  HGB 12.1 12.0 11.9*  HCT 37.5 36.9 37.5  MCV 98.4 97.6 98.9  MCH 31.8 31.7 31.4  MCHC 32.3 32.5 31.7  RDW 13.2 13.4 13.4  PLT 327 295 320    Cardiac Enzymes Recent Labs Lab 02/13/17 0146 02/13/17 0744 02/13/17 1419 02/14/17 0510  TROPONINI 0.23* 0.25* 0.19* 0.11*   No results for input(s): TROPIPOC in the last 168 hours.   BNP Recent Labs Lab 02/12/17 2014  BNP 440.0*     DDimer No  results for input(s): DDIMER in the last 168 hours.   Radiology    Dg Chest Port 1 View  Result Date: 02/13/2017 CLINICAL DATA:  Pulmonary edema EXAM: PORTABLE CHEST 1 VIEW COMPARISON:  Chest radiograph from one day prior. FINDINGS: Stable cardiomediastinal silhouette with mild cardiomegaly and aortic atherosclerosis. No pneumothorax. No pleural effusion. Stable borderline mild pulmonary edema. Mild scarring versus atelectasis at the left costophrenic angle. IMPRESSION: 1. Stable mild cardiomegaly with borderline mild pulmonary edema. 2. Mild left basilar scarring versus atelectasis. Electronically Signed   By: Ilona Sorrel M.D.   On: 02/13/2017 08:30   Dg Chest Portable 1 View  Result Date: 02/12/2017 CLINICAL DATA:  Acute chest pain and tachycardia today. EXAM: PORTABLE CHEST 1 VIEW COMPARISON:  07/10/2011 and prior exams FINDINGS: The cardiomediastinal silhouette is unremarkable. Pulmonary vascular congestion and mild interstitial opacities noted. No pleural effusion or pneumothorax noted. No acute  bony abnormalities are present. IMPRESSION: Pulmonary vascular congestion and probable mild interstitial pulmonary edema. Electronically Signed   By: Margarette Canada M.D.   On: 02/12/2017 20:19    Cardiac Studies   2D Echo 02/13/17 - Left ventricle: The cavity size was normal. Wall thickness was   increased in a pattern of mild to moderate LVH. Systolic function   was normal. The estimated ejection fraction was in the range of   60% to 65%. Wall motion was normal; there were no regional wall   motion abnormalities. Doppler parameters are consistent with   abnormal left ventricular relaxation (grade 1 diastolic   dysfunction). - Aortic valve: Mildly calcified annulus. Trileaflet; mildly   calcified leaflets. There was no stenosis. - Mitral valve: There was mild regurgitation. - Tricuspid valve: There was mild regurgitation.   Patient Profile     83 y.o. female with CAD (NSTEMI 12/2005 s/p BMS to Cx, prior normal LVEF), TIA/prior strokes (CVA seen on imaging), HLD, CKD stage III, anemia, IBS, HTN, PMR, mild carotid disease (<40% bilaterally in 2015)  who is being seen today for the evaluation of atrial fib/chest pain/elevated troponin  Assessment & Plan    1. New onset atrial fibrillation with rapid ventricular response - new finding for patient. CHADSVASC is at least 7 for HTN, age, stroke, female, vascular disease, possibly 8 if you count possible CHF this admission given pulm edema on CXR. Aggrenox stopped and Xarelto started (renally dosed). She is doing well on metoprolol. Left atrium was normal in size so hopeful she will maintain NSR. If afib recurs, may need to consider antiarrhythmic therapy.   2. Chest discomfort/elevated troponin with remote history of CAD s/p PCI 2007 - chest pain occurred solely in the setting of tachy-arrhythmia. She has been able to exercise recently without any adverse symptoms. Troponins low and flat. Pt adamant this did not feel like prior angina. LVEF is  normal, so this is likely demand ischemia. Await final thoughts from MD. Dr. Bronson Ing recommended to continue ASA.   3. Possible acute diastolic CHF - received IV Lasix last night and this AM. Clinically appears improved. Given further bump in Cr, I do not feel she will require a diuretic going out the door.  4. CKD stage III - baseline Cr appears 1.2-1.4, follow. Slight bump this AM after tachy and IV Lasix. Will need OP f/u of this by PCP.  5. Essential HTN -  Systolic BP slightly above goal but given age and diastolic pressure, would continue current regimen and follow.  If OK with Dr. Bronson Ing, suspect she can  be discharged. F/u arranged 11/2.  Signed, Melina Copa PA-C (pager [phone]) 02/14/2017, 9:33 AM    The patient was seen and examined, and I agree with the history, physical exam, assessment and plan as documented above, with modifications as noted below.  She is doing well this morning, and denies chest pain. She is very concerned about feeling lethargic on the higher dose of metoprolol. I told her medications can be adjusted in the future if need be, but I want her to at least attempt the higher dose for suppression of rapid atrial fibrillation.  LV systolic was normal with normal regional wall motion.  She can be discharge on ASA, Lipitor, metoprolol 50 mg bid, and renally-dosed Xarelto.  Goal BP is 130/80 if tolerated. BP remains elevated today.  Given CKD and h/o CAD, an ACEI would be beneficial if tolerated and K remains normal (5.5 two months ago). This can be determined in outpatient setting.   Kate Sable, MD, [facility]  02/14/2017 11:20 AM

## 2017-02-14 NOTE — Care Management Note (Signed)
Case Management Note  Patient Details  Name: Brenda Hall MRN: [id number] Date of Birth: 10/24/1930    Expected Discharge Date:  02/15/17               Expected Discharge Plan:  Home/Self Care  In-House Referral:     Discharge planning Services  CM Consult, Medication Assistance  Post Acute Care Choice:  NA Choice offered to:  NA  DME Arranged:    DME Agency:     HH Arranged:    [place] Agency:     Status of Service:  Completed, signed off  If discussed at [employer] of Stay Meetings, dates discussed:    Additional Comments: Patient discharging today. Patient given 30 day free voucher for Xarelto. No other CM needs.   Graciano Batson, Chauncey Reading, RN 02/14/2017, 10:09 AM

## 2017-02-14 NOTE — Progress Notes (Signed)
IV removed, no issues noted with IV removal, discharge information reviewed with patient and verbalized understanding. Patient awaiting family member to take home.

## 2017-02-16 NOTE — Telephone Encounter (Signed)
I already knew, thx

## 2017-02-21 ENCOUNTER — Ambulatory Visit (INDEPENDENT_AMBULATORY_CARE_PROVIDER_SITE_OTHER): Payer: Medicare Other | Admitting: Adult Health

## 2017-02-21 ENCOUNTER — Encounter: Payer: Self-pay | Admitting: Adult Health

## 2017-02-21 VITALS — BP 122/66 | HR 56 | Ht 63.0 in | Wt 160.0 lb

## 2017-02-21 DIAGNOSIS — I482 Chronic atrial fibrillation: Secondary | ICD-10-CM

## 2017-02-21 DIAGNOSIS — I5032 Chronic diastolic (congestive) heart failure: Secondary | ICD-10-CM

## 2017-02-21 DIAGNOSIS — I2510 Atherosclerotic heart disease of native coronary artery without angina pectoris: Secondary | ICD-10-CM

## 2017-02-21 DIAGNOSIS — I4821 Permanent atrial fibrillation: Secondary | ICD-10-CM

## 2017-02-21 DIAGNOSIS — E7800 Pure hypercholesterolemia, unspecified: Secondary | ICD-10-CM

## 2017-02-21 MED ORDER — METOPROLOL SUCCINATE ER 50 MG PO TB24: ORAL_TABLET | ORAL | 6 refills | Status: DC

## 2017-02-21 NOTE — Patient Instructions (Signed)
Medication Instructions:  Your physician has recommended you make the following change in your medication:  Stop taking Lopressor  Start Taking Toprol XL 75 mg Daily at Bedtime    Labwork: NONE  Testing/Procedures: NONE   Follow-Up: Your physician recommends that you schedule a follow-up appointment in: 1 week for a Blood Pressure Check  Your physician recommends that you schedule a follow-up appointment in: 1 Month     Any Other Special Instructions Will Be Listed Below (If Applicable).     If you need a refill on your cardiac medications before your next appointment, please call your pharmacy.  Thank you for choosing [facility]!

## 2017-02-21 NOTE — Progress Notes (Signed)
Cardiology Office Note   Date:  02/21/2017   ID:  Brenda Hall, Brenda Hall [birth year], MRN [id number]  PCP:  Brenda Kirschner, MD  Cardiologist:  Brenda Dolly MD  Chief Complaint  Patient presents with  . Atrial Fibrillation  . Coronary Artery Disease  . Congestive Heart Failure    History of Present Illness: Brenda Hall is a 83 y.o. female who presents for posthospitalization follow-up after admission for new onset atrial fibrillation with RVR, with other history to include mild carotid stenosis, coronary artery disease with history of myocardial infarction, diastolic CHF, anemia, IBS, hypertension.  Patient was treated IV diltiazem, placed on Xarelto 15 mg daily for a CHADSVASC Score of 8, along with metoprolol increased to 50 mg twice daily.Marland Kitchen  She was diuresed with IV Lasix.  But not placed on diuretics on discharge.  She did have some mildly elevated troponin during hospitalization which was felt to be demand ischemia in the setting of rapid heart rhythm.  She was to be considered for ACE inhibitor as an outpatient if blood pressure needed more control.  Review of discharge labs revealed a potassium of 4.2 creatinine 1.55 BUN of 28.  Comes today feeling very tired and fatigued with high-dose metoprolol.  She states that she can tolerate the nighttime dose but during the day she is very sleepy, her energy level has dropped consistently.  She used to do water aerobics and go to choir practice, go shopping, but she now has so much fatigue she stays at home.  This is frustrating to her as she is normally very active.  She denies, dyspnea, dizziness near syncope or chest discomfort.  Denies bleeding on Xarelto, although there is some bruising.  Past Medical History:  Diagnosis Date  . Anemia   . CAD in native artery    a. NSTEMI 12/2005 s/p BMS to Cx, prior normal LVEF.  Marland Kitchen Cancer ([place]) 1988 and  2009   BCC nose and  Melanoma back  . Cancer of skin Jan. 2014   [facility] Forehead  and Nose  . Carotid stenosis    mild  . Chronic back pain   . CKD (chronic kidney disease), stage III ([place])   . Coronary atherosclerosis of unspecified type of vessel, native or graft   . DJD (degenerative joint disease) of knee   . History of TIA (transient ischemic attack)   . HTN (hypertension)   . IBS (irritable bowel syndrome)   . Myocardial infarction [facility])    5 yrs ago     dr wall  cardiac  . Osteoporosis   . Other diseases of lung, not elsewhere classified    lung nodule  . Polymyalgia rheumatica ([place])   . Shingles 01/2016-02/2016  . Stroke [facility])     Past Surgical History:  Procedure Laterality Date  . ABDOMINAL HYSTERECTOMY  1977   partial  . BACK SURGERY  1982  . CARDIAC CATHETERIZATION    . COLONOSCOPY N/A 01/27/2013   Procedure: COLONOSCOPY;  Surgeon: Rogene Houston, MD;  [facility];  Service: Endoscopy;  Laterality: N/A;  1030  . JOINT REPLACEMENT  2001 and  2011   6 knee surgeries   bil replacement  . KNEE ARTHROSCOPY    . LUMBAR LAMINECTOMY    . PR VEIN BYPASS GRAFT,AORTO-FEM-POP  2010  . removal of melanoma from back  2007  . SPINE SURGERY  2013   Broken back: fusion surgery  . tonisllectomy  1944  . total knee  anthroplasty    . VERTEBROPLASTY  07/11/2011   Procedure: VERTEBROPLASTY;  Surgeon: Faythe Ghee, MD;  Location: [place] NEURO ORS;  Service: Neurosurgery;  Laterality: N/A;  Vertebroplasty of Lumbar Two     Current Outpatient Prescriptions  Medication Sig Dispense Refill  . aspirin 81 MG tablet Take 81 mg by mouth daily.    Marland Kitchen atorvastatin (LIPITOR) 80 MG tablet Take 1 tablet (80 mg total) by mouth at bedtime. 90 tablet 1  . Calcium Carbonate-Vit D-Min (CALCIUM 1200 PO) Take 1 tablet by mouth at bedtime.     Marland Kitchen estrogens, conjugated, (PREMARIN) 0.3 MG tablet Take 1 tablet (0.3 mg total) by mouth every evening.    . fluticasone (FLONASE) 50 MCG/ACT nasal spray USE TWO SPRAYS IN EACH NOSTRIL ONCE DAILY 16 g 5  . loperamide (IMODIUM) 2 MG  capsule Take by mouth as needed for diarrhea or loose stools.    . nitroGLYCERIN (NITROSTAT) 0.4 MG SL tablet Place 1 tablet (0.4 mg total) under the tongue every 5 (five) minutes as needed for chest pain. 30 tablet 0  . pantoprazole (PROTONIX) 40 MG tablet Take 1 tablet (40 mg total) by mouth every morning. 90 tablet 1  . Rivaroxaban (XARELTO) 15 MG TABS tablet Take 1 tablet (15 mg total) by mouth daily with supper. 30 tablet 0  . Zinc 50 MG CAPS Take 50 mg by mouth daily.    . metoprolol succinate (TOPROL-XL) 50 MG 24 hr tablet Take 1 and 1/2 Tablets At Night 45 tablet 6   No current facility-administered medications for this visit.     Allergies:   Epinephrine; Clopidogrel bisulfate; and Oxycodone    Social History:  The patient  reports that she has never smoked. She has never used smokeless tobacco. She reports that she drinks alcohol. She reports that she does not use drugs.   Family History:  The patient's family history includes Atrial fibrillation in her daughter; Cancer in her mother; Heart attack in her father; Heart disease in her father; Hypertension in her daughter, father, and son.    ROS: All other systems are reviewed and negative. Unless otherwise mentioned in H&P    PHYSICAL EXAM: VS:  BP 122/66   Pulse (!) 56   Ht 5\' 3"  (1.6 m)   Wt 160 lb (72.6 kg)   SpO2 97%   BMI 28.34 kg/m  , BMI Body mass index is 28.34 kg/m. GEN: Well nourished, well developed, in no acute distress  HEENT: normal  Neck: no JVD, carotid bruits, or masses Cardiac: IRRR; no murmurs, rubs, or gallops,no edema  Respiratory:  Clear to auscultation bilaterally, normal work of breathing GI: soft, nontender, nondistended, + BS MS: no deformity or atrophy  Skin: warm and dry, no rash Neuro:  Strength and sensation are intact Psych: euthymic mood, full affect  Recent Labs: 12/04/2016: ALT 17 02/12/2017: B Natriuretic Peptide 440.0 02/13/2017: TSH 1.614 02/14/2017: BUN 28; Creatinine, Ser  1.55; Hemoglobin 11.9; Platelets 320; Potassium 4.2; Sodium 139    Lipid Panel    Component Value Date/Time   CHOL 177 12/04/2016 0939   TRIG 155 (H) 12/04/2016 0939   HDL 80 12/04/2016 0939   CHOLHDL 2.2 12/04/2016 0939   CHOLHDL 2.3 12/14/2013 0910   VLDL 20 12/14/2013 0910   LDLCALC 66 12/04/2016 0939      Wt Readings from Last 3 Encounters:  02/21/17 160 lb (72.6 kg)  02/14/17 145 lb 12.8 oz (66.1 kg)  01/24/17 161 lb (73 kg)  Other studies Reviewed: Echocardiogram 02-21-2017 Left ventricle: The cavity size was normal. Wall thickness was   increased in a pattern of mild to moderate LVH. Systolic function   was normal. The estimated ejection fraction was in the range of   60% to 65%. Wall motion was normal; there were no regional wall   motion abnormalities. Doppler parameters are consistent with   abnormal left ventricular relaxation (grade 1 diastolic   dysfunction). - Aortic valve: Mildly calcified annulus. Trileaflet; mildly   calcified leaflets. There was no stenosis. - Mitral valve: There was mild regurgitation. - Tricuspid valve: There was mild regurgitation.   ASSESSMENT AND PLAN:  1.  Atrial fib with RVR: The patient is tolerating Xarelto without bleeding or excessive bruising.  She is however complaining of profound fatigue during the daytime with higher dose of metoprolol which has been raised from 50 mg a day to 100 mg a day.  I am going to change her metoprolol tartrate to long-acting metoprolol succinate and begin at 75 mg at at bedtime.  Hopefully this will help her heart rate and her blood pressure from being too low and increase her energy level.  We will see her again in 1 week for blood pressure and heart rate check and to check in on how well she is feeling with decreased dose.  He will continue other medications as directed.  No labs at this time.  2.  Chronic diastolic heart failure: No evidence of decompensation.  He is not on a diuretic at  this time although she states she urinates recurrently.  He is to see her primary care physician, Dr. Wolfgang Phoenix next week to discuss this.  3.  Hypercholesterolemia: The patient continues on statin therapy, atorvastatin 80 mg daily.  Labs are performed by PCP.  4.  Coronary artery disease: History of prior myocardial infarction.  She continues to have profound fatigue we may need to consider repeating a stress Myoview for reevaluation of progression of CAD.  Most recent ischemic evaluation was in 2008.  Recent echocardiogram did not reveal any wall motion abnormalities.      Current medicines are reviewed at length with the patient today.    Labs/ tests ordered today include:  Brenda Hall. [place] Pugh, ANP, AACC   02/21/2017 2:49 PM    [facility] [address] [place], [place] [address] Phone: [phone]; Fax: [phone]

## 2017-02-26 ENCOUNTER — Encounter: Payer: Self-pay | Admitting: Family Medicine

## 2017-02-26 ENCOUNTER — Ambulatory Visit (INDEPENDENT_AMBULATORY_CARE_PROVIDER_SITE_OTHER): Payer: Medicare Other | Admitting: Family Medicine

## 2017-02-26 VITALS — BP 136/72 | Ht 63.0 in | Wt 161.4 lb

## 2017-02-26 DIAGNOSIS — I4891 Unspecified atrial fibrillation: Secondary | ICD-10-CM | POA: Diagnosis not present

## 2017-02-26 DIAGNOSIS — I2510 Atherosclerotic heart disease of native coronary artery without angina pectoris: Secondary | ICD-10-CM

## 2017-02-26 NOTE — Progress Notes (Signed)
   Subjective:    Patient ID: Brenda Hall, female    DOB: April 26, 1930, 83 y.o.   MRN: [id number]  Atrial Fibrillation  Presents for follow-up visit. Past medical history includes atrial fibrillation.   Patient in today for a Hospitalization follow up from 02/12/17 [facility])  Was seen in the er and admitted to the hospital for a fib with rapid respnse  On adjusted medx  On higher bta blocker therapy  Compliant with new dose of meds  Some slight sob with exertion   Overall doing considerably better at this time  Review of Systems No headache, no major weight loss or weight gain, no chest pain no back pain abdominal pain no change in bowel habits complete ROS otherwise negative     Objective:   Physical Exam  Alert and oriented, vitals reviewed and stable, NAD ENT-TM's and ext canals WNL bilat via otoscopic exam Soft palate, tonsils and post pharynx WNL via oropharyngeal exam Neck-symmetric, no masses; thyroid nonpalpable and nontender Pulmonary-no tachypnea or accessory muscle use; Clear without wheezes via auscultation Card--no abnrml murmurs, rhythm irregular with controlled rate impression atrial fibrillation now with anticoagulation.  Discussed at great length impression Carotid pulses symmetric, without bruits       Assessment & Plan:  Impression atrial fibrillation.  Discussed at great length.  Chronic anticoagulation also discussed thoroughly.  Multiple questions answered.  Greater than 50% of this 25 minute face to face visit was spent in counseling and discussion and coordination of care regarding the above diagnosis/diagnosies

## 2017-02-28 ENCOUNTER — Ambulatory Visit (INDEPENDENT_AMBULATORY_CARE_PROVIDER_SITE_OTHER): Payer: Medicare Other

## 2017-02-28 VITALS — BP 142/84 | HR 66 | Wt 163.0 lb

## 2017-02-28 DIAGNOSIS — I10 Essential (primary) hypertension: Secondary | ICD-10-CM | POA: Diagnosis not present

## 2017-02-28 NOTE — Progress Notes (Signed)
Patient has felt well since medication change.Sleeps well at night, Goes to water aerobics daily.Energy is good

## 2017-02-28 NOTE — Patient Instructions (Signed)
Continue medications unless we call you otherwise      Thank you for choosing [facility] !

## 2017-02-28 NOTE — Progress Notes (Signed)
Glad she is better. Continue current regimen.

## 2017-03-14 ENCOUNTER — Other Ambulatory Visit: Payer: Self-pay | Admitting: Cardiology

## 2017-03-17 ENCOUNTER — Telehealth (INDEPENDENT_AMBULATORY_CARE_PROVIDER_SITE_OTHER): Payer: Self-pay

## 2017-03-17 ENCOUNTER — Telehealth: Payer: Self-pay | Admitting: Adult Health

## 2017-03-17 NOTE — Telephone Encounter (Signed)
I spoke with patient and she describes having hip joint pain. Basis schedule an appointment as soon as possible.

## 2017-03-17 NOTE — Telephone Encounter (Signed)
Patient would like to know if she can be prescribed a Rx for her arthritis pain.  Would like to know if Celebrex could be an option until she can see Dr. Estanislado Pandy?  Would like a CB before be prescribed anything? Cb# [phone] or [phone].  Please advise.  Thank you.

## 2017-03-17 NOTE — Telephone Encounter (Signed)
Patient would like to know if she can be prescribed a Rx for her arthritis pain.  Would like to know if Celebrex could be an option until she can see Dr. Estanislado Pandy?  Would like a CB before be prescribed anything?  We treat patient for POLYMYALGIA RHEUMATICA  Please advise

## 2017-03-18 ENCOUNTER — Ambulatory Visit (INDEPENDENT_AMBULATORY_CARE_PROVIDER_SITE_OTHER): Payer: Self-pay

## 2017-03-18 ENCOUNTER — Ambulatory Visit (INDEPENDENT_AMBULATORY_CARE_PROVIDER_SITE_OTHER): Payer: Medicare Other | Admitting: Rheumatology

## 2017-03-18 ENCOUNTER — Encounter: Payer: Self-pay | Admitting: Rheumatology

## 2017-03-18 VITALS — BP 183/73 | HR 62 | Resp 16 | Ht 63.0 in | Wt 155.0 lb

## 2017-03-18 DIAGNOSIS — Z8719 Personal history of other diseases of the digestive system: Secondary | ICD-10-CM

## 2017-03-18 DIAGNOSIS — M353 Polymyalgia rheumatica: Secondary | ICD-10-CM

## 2017-03-18 DIAGNOSIS — Z8709 Personal history of other diseases of the respiratory system: Secondary | ICD-10-CM

## 2017-03-18 DIAGNOSIS — M47816 Spondylosis without myelopathy or radiculopathy, lumbar region: Secondary | ICD-10-CM | POA: Diagnosis not present

## 2017-03-18 DIAGNOSIS — M25552 Pain in left hip: Secondary | ICD-10-CM | POA: Diagnosis not present

## 2017-03-18 DIAGNOSIS — M19041 Primary osteoarthritis, right hand: Secondary | ICD-10-CM | POA: Diagnosis not present

## 2017-03-18 DIAGNOSIS — Z8781 Personal history of (healed) traumatic fracture: Secondary | ICD-10-CM

## 2017-03-18 DIAGNOSIS — Z8673 Personal history of transient ischemic attack (TIA), and cerebral infarction without residual deficits: Secondary | ICD-10-CM | POA: Diagnosis not present

## 2017-03-18 DIAGNOSIS — Z8639 Personal history of other endocrine, nutritional and metabolic disease: Secondary | ICD-10-CM | POA: Diagnosis not present

## 2017-03-18 DIAGNOSIS — I2510 Atherosclerotic heart disease of native coronary artery without angina pectoris: Secondary | ICD-10-CM

## 2017-03-18 DIAGNOSIS — M8589 Other specified disorders of bone density and structure, multiple sites: Secondary | ICD-10-CM | POA: Diagnosis not present

## 2017-03-18 DIAGNOSIS — M19072 Primary osteoarthritis, left ankle and foot: Secondary | ICD-10-CM

## 2017-03-18 DIAGNOSIS — Z96653 Presence of artificial knee joint, bilateral: Secondary | ICD-10-CM

## 2017-03-18 DIAGNOSIS — M19071 Primary osteoarthritis, right ankle and foot: Secondary | ICD-10-CM | POA: Diagnosis not present

## 2017-03-18 DIAGNOSIS — M19042 Primary osteoarthritis, left hand: Secondary | ICD-10-CM

## 2017-03-18 MED ORDER — PREDNISONE 5 MG PO TABS: ORAL_TABLET | ORAL | 0 refills | Status: DC

## 2017-03-18 NOTE — Telephone Encounter (Signed)
Patient scheduled for 03/18/17 at 1:15 pm

## 2017-03-18 NOTE — Progress Notes (Signed)
Office Visit Note  Patient: Brenda Hall             Date of Birth: May 14, 1930           MRN: [id number]             PCP: [name], MD Referring: [name], MD Visit Date: 03/18/2017 Occupation: @GUAROCC @    Subjective:  Left hip pain.   History of Present Illness: Brenda Hall is a 83 y.o. female with history of osteoarthritis and disc disease. She states her lower back is doing better. She's been having pain and stiffness in her left hip. She states she has difficulty getting up from the sitting position and then taking next few steps has been difficult for her. She has occasional discomfort in her left knee which is been replaced. Due to favoring her left hip her lower back has been painful.  Activities of Daily Living:  Patient reports morning stiffness for 5  minutes.   Patient Denies nocturnal pain.  Difficulty dressing/grooming: Denies Difficulty climbing stairs: Reports Difficulty getting out of chair: Denies Difficulty using hands for taps, buttons, cutlery, and/or writing: Denies   Review of Systems  Constitutional: Positive for fatigue. Negative for night sweats, weight gain, weight loss and weakness.  HENT: Positive for mouth dryness. Negative for mouth sores, trouble swallowing, trouble swallowing and nose dryness.   Eyes: Positive for dryness. Negative for pain, redness and visual disturbance.  Respiratory: Negative.  Negative for cough, shortness of breath and difficulty breathing.   Cardiovascular: Negative.  Positive for hypertension. Negative for chest pain, palpitations, irregular heartbeat and swelling in legs/feet.  Gastrointestinal: Negative.  Negative for blood in stool, constipation and diarrhea.  Endocrine: Negative.  Negative for increased urination.  Genitourinary: Positive for nocturia. Negative for vaginal dryness.  Musculoskeletal: Positive for arthralgias, joint pain and morning stiffness. Negative for joint swelling,  myalgias, muscle weakness, muscle tenderness and myalgias.  Skin: Negative.  Negative for color change, rash, hair loss, skin tightness, ulcers and sensitivity to sunlight.  Allergic/Immunologic: Negative for susceptible to infections.  Neurological: Negative.  Negative for dizziness, headaches, memory loss and night sweats.  Hematological: Negative.  Negative for swollen glands.  Psychiatric/Behavioral: Negative.  Negative for depressed mood and sleep disturbance. The patient is not nervous/anxious.     PMFS History:  Patient Active Problem List   Diagnosis Date Noted  . Atrial fibrillation with rapid ventricular response ([place]) 02/12/2017  . Hyperlipidemia 02/12/2017  . Pulmonary edema 02/12/2017  . Urgency of micturition 02/12/2017  . Osteopenia of multiple sites 07/23/2016  . Age-related osteoporosis without current pathological fracture 07/11/2016  . History of total knee replacement, bilateral 07/11/2016  . History of COPD 07/11/2016  . Spondylosis of lumbar region without myelopathy or radiculopathy 07/11/2016  . Primary osteoarthritis of both hands 07/11/2016  . Primary osteoarthritis of both feet 07/11/2016  . Vitamin D deficiency 07/11/2016  . History of vertebral fracture 07/11/2016  . History of IBS 07/11/2016  . Basal cell carcinoma 07/11/2016  . History of CVA (cerebrovascular accident)/ has metal implant not MRI safe  07/11/2016  . Other and unspecified hyperlipidemia 11/19/2012  . Cerebrovascular accident (stroke) ([place]) 11/19/2012  . Difficulty in walking(719.7) 11/06/2012  . Occlusion and stenosis of carotid artery without mention of cerebral infarction 02/07/2012  . OLD MYOCARDIAL INFARCTION 11/07/2009  . CAD, NATIVE VESSEL 11/07/2009  . Essential hypertension 09/28/2008  . IRRITABLE BOWEL SYNDROME 09/28/2008  . DEGENERATIVE JOINT DISEASE, KNEE 09/28/2008  .  POLYMYALGIA RHEUMATICA 09/28/2008  . Chest pain, unspecified 09/28/2008    Past Medical History:    Diagnosis Date  . Anemia   . CAD in native artery    a. NSTEMI 12/2005 s/p BMS to Cx, prior normal LVEF.  Marland Kitchen Cancer ([place]) 1988 and  2009   BCC nose and  Melanoma back  . Cancer of skin Jan. 2014   [facility] Forehead and Nose  . Carotid stenosis    mild  . Chronic back pain   . CKD (chronic kidney disease), stage III ([place])   . Coronary atherosclerosis of unspecified type of vessel, native or graft   . DJD (degenerative joint disease) of knee   . History of TIA (transient ischemic attack)   . HTN (hypertension)   . IBS (irritable bowel syndrome)   . Myocardial infarction [facility])    5 yrs ago     dr wall  cardiac  . Osteoporosis   . Other diseases of lung, not elsewhere classified    lung nodule  . Polymyalgia rheumatica ([place])   . Shingles 01/2016-02/2016  . Stroke [facility])     Family History  Problem Relation Age of Onset  . Heart disease Father   . Hypertension Father   . Heart attack Father   . Cancer Mother        bladder   . Hypertension Daughter   . Atrial fibrillation Daughter   . Hypertension Son   . Colon cancer Neg Hx    Past Surgical History:  Procedure Laterality Date  . ABDOMINAL HYSTERECTOMY  1977   partial  . BACK SURGERY  1982  . CARDIAC CATHETERIZATION    . COLONOSCOPY N/A 01/27/2013   Procedure: COLONOSCOPY;  Surgeon: Rogene Houston, MD;  [facility];  Service: Endoscopy;  Laterality: N/A;  1030  . JOINT REPLACEMENT  2001 and  2011   6 knee surgeries   bil replacement  . KNEE ARTHROSCOPY    . LUMBAR LAMINECTOMY    . PR VEIN BYPASS GRAFT,AORTO-FEM-POP  2010  . removal of melanoma from back  2007  . SPINE SURGERY  2013   Broken back: fusion surgery  . tonisllectomy  1944  . total knee anthroplasty    . VERTEBROPLASTY  07/11/2011   Procedure: VERTEBROPLASTY;  Surgeon: Faythe Ghee, MD;  Location: [place] NEURO ORS;  Service: Neurosurgery;  Laterality: N/A;  Vertebroplasty of Lumbar Two   Social History   Social History Narrative   Married,  retired. 3 children. Regular exercise -yes; hobbies = swimming.     Objective: Vital Signs: BP (!) 183/73   Pulse 62   Resp 16   Ht 5\' 3"  (1.6 m)   Wt 155 lb (70.3 kg)   BMI 27.46 kg/m    Physical Exam  Constitutional: She is oriented to person, place, and time. She appears well-developed and well-nourished.  HENT:  Head: Normocephalic and atraumatic.  Eyes: Conjunctivae and EOM are normal.  Neck: Normal range of motion.  Cardiovascular: Normal rate, regular rhythm, normal heart sounds and intact distal pulses.  Pulmonary/Chest: Effort normal and breath sounds normal.  Abdominal: Soft. Bowel sounds are normal.  Lymphadenopathy:    She has no cervical adenopathy.  Neurological: She is alert and oriented to person, place, and time.  Skin: Skin is warm and dry. Capillary refill takes less than 2 seconds.  Psychiatric: She has a normal mood and affect. Her behavior is normal.  Nursing note and vitals reviewed.  Musculoskeletal Exam: C-spine good range of motion. She has some thoracic kyphosis. She has limited range of motion of her lumbar spine. No vertebral tenderness was noted. Shoulder joints elbow joints wrist joints are good range of motion. She has some DIP PIP and CMC thickening consistent with osteoarthritis of her hands. She good range of motion of bilateral hip joints. Although she had discomfort in her left hip joint while walking. Her bilateral total knee replacements are doing well. She had no muscular weakness.  CDAI Exam: No CDAI exam completed.    Investigation: No additional findings.   Imaging: Xr Hip Unilat W Or W/o Pelvis 2-3 Views Left  Result Date: 03/18/2017 Moderate superior-lateral narrowing consistent with moderate osteoarthritis of the left hip joint.  Xr Lumbar Spine 2-3 Views  Result Date: 03/18/2017 Severe lumbar scoliosis with multilevel spondylosis L4-L5 listhesis. She has L2 kyphoplasty changes. Or compression fracture noted in L3  vertebra. Severe facet joint arthropathy.   Speciality Comments: No specialty comments available.    Procedures:  No procedures performed Allergies: Epinephrine; Clopidogrel bisulfate; and Oxycodone   Assessment / Plan:     Visit Diagnoses: Pain of left hip joint - she continues to have some discomfort in her left hip especially while walking. She good range of motion of her left hip joint. Plan: XR HIP UNILAT W OR W/O PELVIS 2-3 VIEWS LEFT. She has moderate osteoarthritis of the left hip joint. She has difficulty walking due to left hip pain. Although her range of motion was good. I offered cortisone injection which she declined. She's also on Xeralto which makes the situation or complicated. At this point I'm uncertain if the pain is coming from her hip joint or her back. We discussed a short taper of prednisone. I gave her prednisone 5 mg tablets. She will take 4 tablets for 2 days, 3 for 2 days, 2 for 2 days and 1 for 2 days. If her symptoms persist she supposed to notify me.  Spondylosis of lumbar region without myelopathy or radiculopathy - Plan: XR Lumbar Spine 2-3 Views. X-rays reveal severe spondylosis,old  vertebral fracture, scoliosis and facet joint arthropathy. The etiology of her left hip pain could be related to her disc disease.  Primary osteoarthritis of both hands.: She has some severe osteoarthritis in her hands but is not causing must discomfort.  Primary osteoarthritis of both feet: Doing fairly well  History of total knee replacement, bilateral: Doing well  Osteopenia of multiple sites - Use of calcium and vitamin D was discussed.  History of vitamin D deficiency - She is on supplement  History of vertebral fracture  POLYMYALGIA RHEUMATICA: In remission  Other medical problems are listed as follows:  History of IBS  History of CVA (cerebrovascular accident) - has metal implant not MRI safe    History of COPD    Orders: Orders Placed This Encounter    Procedures  . XR HIP UNILAT W OR W/O PELVIS 2-3 VIEWS LEFT  . XR Lumbar Spine 2-3 Views   Meds ordered this encounter  Medications  . predniSONE (DELTASONE) 5 MG tablet    Sig: Take 4 tabs for 2 days, then 3 tabs for 2 days, then 2 tabs for 2 days and 1 tab for 2 days.    Dispense:  20 tablet    Refill:  0    Face-to-face time spent with patient was 30 minutes. Greater than 50% of time was spent in counseling and coordination of care.  Follow-Up Instructions: Return  in about 6 months (around 09/15/2017) for Osteoarthritis.   Bo Merino, MD  Note - This record has been created using Editor, commissioning.  Chart creation errors have been sought, but may not always  have been located. Such creation errors do not reflect on  the standard of medical care.

## 2017-03-21 NOTE — Progress Notes (Signed)
Cardiology Office Note   Date:  03/24/2017   ID:  Delle Laasia, Arcos 07/30/1930, MRN [id number]  PCP:  [name], MD  Cardiologist:  Carlyle Dolly, MD Chief Complaint  Patient presents with  . Atrial Fibrillation  . Hyperlipidemia     History of Present Illness: Brenda Hall is a 83 y.o. female who presents for ongoing assessment and management of atrial fibrillation, is on Xarelto for CVA prophylaxis, chronic diastolic heart failure, hypercholesterolemia, and coronary artery disease with history of prior MI.  On last office visit on 02/21/2017, she complained of profound fatigue and decreased stamina.  Patient's metoprolol was decreased from 100 mg daily to 75 mg at at bedtime.  The patient followed up with a phone call to our office on 02/28/2017 stating she was feeling much better with decreased dose of metoprolol.  She has returned to water aerobics.    Past Medical History:  Diagnosis Date  . Anemia   . CAD in native artery    a. NSTEMI 12/2005 s/p BMS to Cx, prior normal LVEF.  Marland Kitchen Cancer ([place]) 1988 and  2009   BCC nose and  Melanoma back  . Cancer of skin Jan. 2014   [facility] Forehead and Nose  . Carotid stenosis    mild  . Chronic back pain   . CKD (chronic kidney disease), stage III ([place])   . Coronary atherosclerosis of unspecified type of vessel, native or graft   . DJD (degenerative joint disease) of knee   . History of TIA (transient ischemic attack)   . HTN (hypertension)   . IBS (irritable bowel syndrome)   . Myocardial infarction [facility])    5 yrs ago     dr wall  cardiac  . Osteoporosis   . Other diseases of lung, not elsewhere classified    lung nodule  . Polymyalgia rheumatica ([place])   . Shingles 01/2016-02/2016  . Stroke [facility])     Past Surgical History:  Procedure Laterality Date  . ABDOMINAL HYSTERECTOMY  1977   partial  . BACK SURGERY  1982  . CARDIAC CATHETERIZATION    . COLONOSCOPY N/A 01/27/2013   Procedure: COLONOSCOPY;  Surgeon:  Rogene Houston, MD;  [facility];  Service: Endoscopy;  Laterality: N/A;  1030  . JOINT REPLACEMENT  2001 and  2011   6 knee surgeries   bil replacement  . KNEE ARTHROSCOPY    . LUMBAR LAMINECTOMY    . PR VEIN BYPASS GRAFT,AORTO-FEM-POP  2010  . removal of melanoma from back  2007  . SPINE SURGERY  2013   Broken back: fusion surgery  . tonisllectomy  1944  . total knee anthroplasty    . VERTEBROPLASTY  07/11/2011   Procedure: VERTEBROPLASTY;  Surgeon: Faythe Ghee, MD;  Location: [place] NEURO ORS;  Service: Neurosurgery;  Laterality: N/A;  Vertebroplasty of Lumbar Two     Current Outpatient Medications  Medication Sig Dispense Refill  . aspirin 81 MG tablet Take 81 mg by mouth daily.    Marland Kitchen atorvastatin (LIPITOR) 80 MG tablet Take 1 tablet (80 mg total) by mouth at bedtime. 90 tablet 1  . Calcium Carbonate-Vit D-Min (CALCIUM 1200 PO) Take 1 tablet by mouth at bedtime.     Marland Kitchen estrogens, conjugated, (PREMARIN) 0.3 MG tablet Take 1 tablet (0.3 mg total) by mouth every evening.    . fluticasone (FLONASE) 50 MCG/ACT nasal spray USE TWO SPRAYS IN EACH NOSTRIL ONCE DAILY 16 g 5  .  loperamide (IMODIUM) 2 MG capsule Take by mouth as needed for diarrhea or loose stools.    . metoprolol succinate (TOPROL-XL) 50 MG 24 hr tablet Take 1 and 1/2 Tablets At Night 45 tablet 6  . nitroGLYCERIN (NITROSTAT) 0.4 MG SL tablet Place 1 tablet (0.4 mg total) under the tongue every 5 (five) minutes as needed for chest pain. 30 tablet 0  . pantoprazole (PROTONIX) 40 MG tablet Take 1 tablet (40 mg total) by mouth every morning. 90 tablet 1  . predniSONE (DELTASONE) 5 MG tablet Take 4 tabs for 2 days, then 3 tabs for 2 days, then 2 tabs for 2 days and 1 tab for 2 days. 20 tablet 0  . XARELTO 15 MG TABS tablet TAKE ONE TABLET (15MG  TOTAL) BY MOUTH DAILY WITH SUPPER 30 tablet 6  . Zinc 50 MG CAPS Take 50 mg by mouth daily.     No current facility-administered medications for this visit.     Allergies:    Epinephrine; Clopidogrel bisulfate; and Oxycodone    Social History:  The patient  reports that  has never smoked. she has never used smokeless tobacco. She reports that she drinks alcohol. She reports that she does not use drugs.   Family History:  The patient's family history includes Atrial fibrillation in her daughter; Cancer in her mother; Heart attack in her father; Heart disease in her father; Hypertension in her daughter, father, and son.    ROS: All other systems are reviewed and negative. Unless otherwise mentioned in H&P    PHYSICAL EXAM: VS:  Ht 5\' 3"  (1.6 m)   Wt 163 lb (73.9 kg)   BMI 28.87 kg/m  , BMI Body mass index is 28.87 kg/m. GEN: Well nourished, well developed, in no acute distress  HEENT: normal  Neck: no JVD, carotid bruits, or masses Cardiac: RRR; no murmurs, rubs, or gallops,no edema  Respiratory: Clear to auscultation bilaterally, normal work of breathing GI: soft, nontender, nondistended, + BS MS: no deformity or atrophy  Skin: warm and dry, no rash Neuro:  Strength and sensation are intact Psych: euthymic mood, full affect   Recent Labs: 12/04/2016: ALT 17 02/12/2017: B Natriuretic Peptide 440.0 02/13/2017: TSH 1.614 02/14/2017: BUN 28; Creatinine, Ser 1.55; Hemoglobin 11.9; Platelets 320; Potassium 4.2; Sodium 139    Lipid Panel    Component Value Date/Time   CHOL 177 12/04/2016 0939   TRIG 155 (H) 12/04/2016 0939   HDL 80 12/04/2016 0939   CHOLHDL 2.2 12/04/2016 0939   CHOLHDL 2.3 12/14/2013 0910   VLDL 20 12/14/2013 0910   LDLCALC 66 12/04/2016 0939      Wt Readings from Last 3 Encounters:  03/24/17 163 lb (73.9 kg)  03/18/17 155 lb (70.3 kg)  02/28/17 163 lb (73.9 kg)      Other studies Reviewed:  Echocardiogram 02/13/2017 Left ventricle: The cavity size was normal. Wall thickness was   increased in a pattern of mild to moderate LVH. Systolic function   was normal. The estimated ejection fraction was in the range of   60%  to 65%. Wall motion was normal; there were no regional wall   motion abnormalities. Doppler parameters are consistent with   abnormal left ventricular relaxation (grade 1 diastolic   dysfunction). - Aortic valve: Mildly calcified annulus. Trileaflet; mildly   calcified leaflets. There was no stenosis. - Mitral valve: There was mild regurgitation. - Tricuspid valve: There was mild regurgitation.  ASSESSMENT AND PLAN:  1. Hypertension: She states that her  BP at home has been rising into the 160's and 170's. She has brought with her the home BP cuff. This does correlate with our readings in the office. She has recently been started on a prednisone taper for back pain which my be contributing. However, with decreased dose of metoprolol from last visit, she may have elevation in BP. I will start her on amlodipine 5 mg daily. Will see her back in one week for BP check.   2. Atrial fib: Heart rate is well controlled. She remains on Xarelto and metoprolol. Lower dose has helped her stamina and lessened her fatigue.   3. Hypercholesterolemia: Remains on statin therapy.    Current medicines are reviewed at length with the patient today.    Labs/ tests ordered today include:  Phill Myron. [place] Pugh, ANP, AACC   03/24/2017 3:06 PM    [facility] [address] [place], [place] [address] Phone: [phone]; Fax: [phone]

## 2017-03-24 ENCOUNTER — Encounter: Payer: Self-pay | Admitting: Adult Health

## 2017-03-24 ENCOUNTER — Ambulatory Visit (INDEPENDENT_AMBULATORY_CARE_PROVIDER_SITE_OTHER): Payer: Medicare Other | Admitting: Adult Health

## 2017-03-24 VITALS — BP 164/82 | HR 68 | Ht 63.0 in | Wt 163.0 lb

## 2017-03-24 DIAGNOSIS — E7800 Pure hypercholesterolemia, unspecified: Secondary | ICD-10-CM | POA: Diagnosis not present

## 2017-03-24 DIAGNOSIS — I10 Essential (primary) hypertension: Secondary | ICD-10-CM

## 2017-03-24 DIAGNOSIS — I480 Paroxysmal atrial fibrillation: Secondary | ICD-10-CM

## 2017-03-24 DIAGNOSIS — I2510 Atherosclerotic heart disease of native coronary artery without angina pectoris: Secondary | ICD-10-CM

## 2017-03-24 MED ORDER — AMLODIPINE BESYLATE 5 MG PO TABS: 5.0000 mg | ORAL_TABLET | Freq: Every day | ORAL | 11 refills | Status: DC

## 2017-03-24 NOTE — Patient Instructions (Addendum)
Medication Instructions:  Your physician recommends that you continue on your current medications as directed. Please refer to the Current Medication list given to you today. Start Norvasc 5 mg Daily   Labwork: NONE   Testing/Procedures: NONE   Follow-Up: Your physician recommends that you schedule a follow-up appointment on Monday for Blood Pressure Check  Your physician recommends that you schedule a follow-up appointment in: 1 Month     Any Other Special Instructions Will Be Listed Below (If Applicable).  Take your Blood Pressure Two Times Daily, once in the morning and again in the evening.    If you need a refill on your cardiac medications before your next appointment, please call your pharmacy.  Thank you for choosing [facility]!

## 2017-03-27 ENCOUNTER — Other Ambulatory Visit: Payer: Self-pay

## 2017-03-28 ENCOUNTER — Ambulatory Visit (INDEPENDENT_AMBULATORY_CARE_PROVIDER_SITE_OTHER): Payer: Medicare Other

## 2017-03-28 VITALS — BP 144/76 | HR 73

## 2017-03-28 DIAGNOSIS — Z0130 Encounter for examination of blood pressure without abnormal findings: Secondary | ICD-10-CM

## 2017-03-28 NOTE — Progress Notes (Signed)
Will increase amlodipine to 10 mg daily. 

## 2017-03-28 NOTE — Progress Notes (Signed)
Patient came in today vs apt next week for BP check.She knows office is closed until 04/01/17 and we will speak with her then

## 2017-03-28 NOTE — Patient Instructions (Signed)
Continue medications as directed, unless we call you back. I have forwarded your blood pressure readings to Dr Purcell Nails    Thank you for choosing [facility] !

## 2017-03-31 ENCOUNTER — Ambulatory Visit: Payer: Medicare Other

## 2017-04-03 ENCOUNTER — Telehealth: Payer: Self-pay

## 2017-04-03 MED ORDER — AMLODIPINE BESYLATE 10 MG PO TABS: 10.0000 mg | ORAL_TABLET | Freq: Every day | ORAL | 3 refills | Status: DC

## 2017-04-03 NOTE — Telephone Encounter (Signed)
-----   Message from Lendon Colonel, NP sent at 03/28/2017  2:27 PM EST -----   ----- Message ----- From: Bernita Raisin, RN Sent: 03/28/2017  12:15 PM To: Lendon Colonel, NP

## 2017-04-09 DIAGNOSIS — N3941 Urge incontinence: Secondary | ICD-10-CM | POA: Diagnosis not present

## 2017-04-09 DIAGNOSIS — N3281 Overactive bladder: Secondary | ICD-10-CM | POA: Diagnosis not present

## 2017-04-10 ENCOUNTER — Telehealth: Payer: Self-pay | Admitting: Cardiology

## 2017-04-10 MED ORDER — AMLODIPINE BESYLATE 5 MG PO TABS: 5.0000 mg | ORAL_TABLET | Freq: Every day | ORAL | 3 refills | Status: DC

## 2017-04-10 NOTE — Telephone Encounter (Signed)
BP 139/67, took only 1 day of amlodipine 10 mg for one day, says it made her wobbly and no energy. States he will take 5 mg only and she will watch her BP

## 2017-04-10 NOTE — Telephone Encounter (Signed)
Please give pt a call concerning BP @ [phone]

## 2017-04-10 NOTE — Telephone Encounter (Signed)
Thanks for the update

## 2017-04-16 ENCOUNTER — Telehealth: Payer: Self-pay | Admitting: Adult Health

## 2017-04-16 ENCOUNTER — Other Ambulatory Visit: Payer: Self-pay | Admitting: Adult Health

## 2017-04-16 MED ORDER — METOPROLOL SUCCINATE ER 50 MG PO TB24: ORAL_TABLET | ORAL | 3 refills | Status: DC

## 2017-04-16 NOTE — Telephone Encounter (Signed)
Patient states that new RX sent in to [facility] was for wrong dosage. Please call patient to clarify. / tg

## 2017-04-16 NOTE — Telephone Encounter (Signed)
Patient wants Metoprolol RX sent to [facility] / tg

## 2017-04-16 NOTE — Telephone Encounter (Signed)
Sent pt's metoprolol to [facility].

## 2017-04-17 ENCOUNTER — Other Ambulatory Visit: Payer: Self-pay | Admitting: *Deleted

## 2017-04-17 MED ORDER — METOPROLOL SUCCINATE ER 50 MG PO TB24: ORAL_TABLET | ORAL | 3 refills | Status: DC

## 2017-04-18 ENCOUNTER — Other Ambulatory Visit: Payer: Self-pay

## 2017-04-18 MED ORDER — RIVAROXABAN 15 MG PO TABS: ORAL_TABLET | ORAL | 1 refills | Status: DC

## 2017-04-18 NOTE — Telephone Encounter (Signed)
Xarelto 15 mg , 90 day supply requested by [facility] e-scribed

## 2017-04-20 ENCOUNTER — Other Ambulatory Visit: Payer: Self-pay | Admitting: Rheumatology

## 2017-04-21 NOTE — Telephone Encounter (Signed)
Last Visit: 03/18/17 Next Visit: 07/23/17  Okay to refill per Dr. Estanislado Pandy

## 2017-04-22 HISTORY — PX: EYE SURGERY: SHX253

## 2017-05-01 ENCOUNTER — Ambulatory Visit (INDEPENDENT_AMBULATORY_CARE_PROVIDER_SITE_OTHER): Payer: Medicare Other | Admitting: Cardiology

## 2017-05-01 ENCOUNTER — Encounter: Payer: Self-pay | Admitting: Cardiology

## 2017-05-01 VITALS — BP 144/74 | HR 89 | Ht 63.0 in | Wt 159.0 lb

## 2017-05-01 DIAGNOSIS — E782 Mixed hyperlipidemia: Secondary | ICD-10-CM

## 2017-05-01 DIAGNOSIS — I2510 Atherosclerotic heart disease of native coronary artery without angina pectoris: Secondary | ICD-10-CM | POA: Diagnosis not present

## 2017-05-01 DIAGNOSIS — I4891 Unspecified atrial fibrillation: Secondary | ICD-10-CM

## 2017-05-01 DIAGNOSIS — I10 Essential (primary) hypertension: Secondary | ICD-10-CM | POA: Diagnosis not present

## 2017-05-01 NOTE — Patient Instructions (Signed)
Medication Instructions:  Your physician recommends that you continue on your current medications as directed. Please refer to the Current Medication list given to you today.   Labwork: NONE  Testing/Procedures: NONE  Follow-Up: Your physician recommends that you schedule a follow-up appointment in: 4 MONTHS    Any Other Special Instructions Will Be Listed Below (If Applicable).     If you need a refill on your cardiac medications before your next appointment, please call your pharmacy.   

## 2017-05-01 NOTE — Progress Notes (Signed)
Clinical Summary [name] is a 84 y.o.female seen today for follow up of the following medical problems.   1. CAD - prior NSTEMI 12/31/2005, she had a BMS placed to LCX. LVEF at that time 52% - echo 03/2008 LVEF normal, no LVEF given   - lisinopril stopped due to hyperkalemia recently.  - no recent chest pain or SOB  2. Hx of TIA - no recent symptoms   3, Hyperlipidemia - compliant with statin   4. HTN - norvasc 10mg  caused dizziness, she is back on 5mg  daily - lopressor at 100mg  caused fatigue. Change to Toprol XL 75mg  daily and tolerating well  5. CKD III - followed at [facility].   6. New onset afib - new diagnosis during 01/2017 admissoin - CHADS2Vasc score is 7, started on xarelto - converted out of afib on her own.  - no recent palpitations.  - paying 40$ copay for xarelto which is manageable.     SH: former Chartered certified accountant x 25 years. Husband by second marriage was a general in the [employer], had been in [employer] stationed in [place]. Gen. Loney Loh.     Past Medical History:  Diagnosis Date  . Anemia   . CAD in native artery    a. NSTEMI 12/2005 s/p BMS to Cx, prior normal LVEF.  Marland Kitchen Cancer ([place]) 1988 and  2009   BCC nose and  Melanoma back  . Cancer of skin Jan. 2014   [facility] Forehead and Nose  . Carotid stenosis    mild  . Chronic back pain   . CKD (chronic kidney disease), stage III ([place])   . Coronary atherosclerosis of unspecified type of vessel, native or graft   . DJD (degenerative joint disease) of knee   . History of TIA (transient ischemic attack)   . HTN (hypertension)   . IBS (irritable bowel syndrome)   . Myocardial infarction [facility])    5 yrs ago     dr wall  cardiac  . Osteoporosis   . Other diseases of lung, not elsewhere classified    lung nodule  . Polymyalgia rheumatica ([place])   . Shingles 01/2016-02/2016  . Stroke [facility])      Allergies  Allergen Reactions  . Epinephrine Other  (See Comments)    During dental procedure-caused facial swelling at injection site. Pt has had epinephrine since then for nose and facial surgery and did fine with it per pt.  . Clopidogrel Bisulfate Itching and Rash    Plavix  . Oxycodone     hallucinations     Current Outpatient Medications  Medication Sig Dispense Refill  . amLODipine (NORVASC) 5 MG tablet Take 1 tablet (5 mg total) by mouth daily. 90 tablet 3  . aspirin 81 MG tablet Take 81 mg by mouth daily.    Marland Kitchen atorvastatin (LIPITOR) 80 MG tablet Take 1 tablet (80 mg total) by mouth at bedtime. 90 tablet 1  . Calcium Carbonate-Vit D-Min (CALCIUM 1200 PO) Take 1 tablet by mouth at bedtime.     Marland Kitchen estrogens, conjugated, (PREMARIN) 0.3 MG tablet Take 1 tablet (0.3 mg total) by mouth every evening.    . fluticasone (FLONASE) 50 MCG/ACT nasal spray USE TWO SPRAYS IN EACH NOSTRIL ONCE DAILY 16 g 5  . loperamide (IMODIUM) 2 MG capsule Take by mouth as needed for diarrhea or loose stools.    . metoprolol succinate (TOPROL-XL) 50 MG 24 hr tablet Take 1 and 1/2 Tablets At  Night 135 tablet 3  . nitroGLYCERIN (NITROSTAT) 0.4 MG SL tablet Place 1 tablet (0.4 mg total) under the tongue every 5 (five) minutes as needed for chest pain. 30 tablet 0  . pantoprazole (PROTONIX) 40 MG tablet TAKE 1 TABLET EVERY MORNING 90 tablet 1  . predniSONE (DELTASONE) 5 MG tablet Take 4 tabs for 2 days, then 3 tabs for 2 days, then 2 tabs for 2 days and 1 tab for 2 days. 20 tablet 0  . Rivaroxaban (XARELTO) 15 MG TABS tablet TAKE ONE TABLET (15MG  TOTAL) BY MOUTH DAILY WITH SUPPER 90 tablet 1  . Zinc 50 MG CAPS Take 50 mg by mouth daily.     No current facility-administered medications for this visit.      Past Surgical History:  Procedure Laterality Date  . ABDOMINAL HYSTERECTOMY  1977   partial  . BACK SURGERY  1982  . CARDIAC CATHETERIZATION    . COLONOSCOPY N/A 01/27/2013   Procedure: COLONOSCOPY;  Surgeon: Rogene Houston, MD;  [facility];   Service: Endoscopy;  Laterality: N/A;  1030  . JOINT REPLACEMENT  2001 and  2011   6 knee surgeries   bil replacement  . KNEE ARTHROSCOPY    . LUMBAR LAMINECTOMY    . PR VEIN BYPASS GRAFT,AORTO-FEM-POP  2010  . removal of melanoma from back  2007  . SPINE SURGERY  2013   Broken back: fusion surgery  . tonisllectomy  1944  . total knee anthroplasty    . VERTEBROPLASTY  07/11/2011   Procedure: VERTEBROPLASTY;  Surgeon: Faythe Ghee, MD;  Location: [place] NEURO ORS;  Service: Neurosurgery;  Laterality: N/A;  Vertebroplasty of Lumbar Two     Allergies  Allergen Reactions  . Epinephrine Other (See Comments)    During dental procedure-caused facial swelling at injection site. Pt has had epinephrine since then for nose and facial surgery and did fine with it per pt.  . Clopidogrel Bisulfate Itching and Rash    Plavix  . Oxycodone     hallucinations      Family History  Problem Relation Age of Onset  . Heart disease Father   . Hypertension Father   . Heart attack Father   . Cancer Mother        bladder   . Hypertension Daughter   . Atrial fibrillation Daughter   . Hypertension Son   . Colon cancer Neg Hx      Social History [name] reports that  has never smoked. she has never used smokeless tobacco. Ms. Raysor reports that she drinks alcohol.   Review of Systems CONSTITUTIONAL: No weight loss, fever, chills, weakness or fatigue.  HEENT: Eyes: No visual loss, blurred vision, double vision or yellow sclerae.No hearing loss, sneezing, congestion, runny nose or sore throat.  SKIN: No rash or itching.  CARDIOVASCULAR: per hpi RESPIRATORY: No shortness of breath, cough or sputum.  GASTROINTESTINAL: No anorexia, nausea, vomiting or diarrhea. No abdominal pain or blood.  GENITOURINARY: No burning on urination, no polyuria NEUROLOGICAL: No headache, dizziness, syncope, paralysis, ataxia, numbness or tingling in the extremities. No change in bowel or bladder  control.  MUSCULOSKELETAL: No muscle, back pain, joint pain or stiffness.  LYMPHATICS: No enlarged nodes. No history of splenectomy.  PSYCHIATRIC: No history of depression or anxiety.  ENDOCRINOLOGIC: No reports of sweating, cold or heat intolerance. No polyuria or polydipsia.  Marland Kitchen   Physical Examination Vitals:   05/01/17 1544  BP: (!) 144/74  Pulse: 89  SpO2: 98%   Vitals:   05/01/17 1544  Weight: 159 lb (72.1 kg)  Height: 5\' 3"  (1.6 m)    Gen: resting comfortably, no acute distress HEENT: no scleral icterus, pupils equal round and reactive, no palptable cervical adenopathy,  CV: RRR, no m/r/g, no jvd Resp: Clear to auscultation bilaterally GI: abdomen is soft, non-tender, non-distended, normal bowel sounds, no hepatosplenomegaly MSK: extremities are warm, no edema.  Skin: warm, no rash Neuro:  no focal deficits Psych: appropriate affect   Diagnostic Studies 02/2013 Carotid US <40% bilateral ICA disease  03/2008 Echo SUMMARY - Overall left ventricular systolic function was normal. There were no left ventricular regional wall motion abnormalities. Left ventricular wall thickness was mildly increased. - There was trivial aortic valvular regurgitation. - The effective orifice of mitral regurgitation by proximal isovelocity surface area was 0.06 cm^2. The volume of mitral regurgitation by proximal isovelocity surface area was 9 cc. - The left atrium was mildly dilated. - The estimated peak right ventricular systolic pressure was within the upper limits of normal. - The right atrium was mildly dilated.    Assessment and Plan  1. CAD - no symptoms, conitnue current meds  2. Hyperlipidemia - she will continue statin, request labs from pcp  3. HTN - ACE-I stopped recently due to high potassium. May consider trial of low dose at some point, she had been on 10mg  daily previously - elevated in clinic, more aggressive control caused symptoms. Continue current  therapy.  4. Afib - no recent symptoms - continue current meds       Arnoldo Lenis, M.D., F.A.C.C.

## 2017-05-02 DIAGNOSIS — H2513 Age-related nuclear cataract, bilateral: Secondary | ICD-10-CM | POA: Diagnosis not present

## 2017-05-05 ENCOUNTER — Encounter: Payer: Self-pay | Admitting: Cardiology

## 2017-05-14 DIAGNOSIS — H2512 Age-related nuclear cataract, left eye: Secondary | ICD-10-CM | POA: Diagnosis not present

## 2017-05-14 DIAGNOSIS — H2511 Age-related nuclear cataract, right eye: Secondary | ICD-10-CM | POA: Diagnosis not present

## 2017-05-21 DIAGNOSIS — H2512 Age-related nuclear cataract, left eye: Secondary | ICD-10-CM | POA: Diagnosis not present

## 2017-05-26 NOTE — Progress Notes (Signed)
Office Visit Note  Patient: Brenda Hall             Date of Birth: Oct 28, 1930           MRN: [id number]             PCP: [name], MD Referring: [name], MD Visit Date: 05/27/2017 Occupation: @GUAROCC @    Subjective:  Lower back pain   History of Present Illness: Brenda Hall is a 84 y.o. female with history of polymyalgia rheumatica, osteoarthritis, spondylosis of lumbar spine, and osteoporosis.  Patient states that yesterday morning she woke up in severe pain in her lower back and left hip.  She states that she was having sharp pains in her left groin.  She states that was having difficulty lifting her left leg.  She states the pain has decreased and she has been able to fully walk today.  She states that she has not been on long-term prednisone for 15 years.  She states that she goes to water aerobics regularly, but she has been unable to due to recent eye procedure.  She has noticed increased joint aches and fatigue.     Activities of Daily Living:  Patient reports morning stiffness for 5-10  minutes.   Patient Reports nocturnal pain.  Difficulty dressing/grooming: Denies Difficulty climbing stairs: Reports Difficulty getting out of chair: Denies Difficulty using hands for taps, buttons, cutlery, and/or writing: Denies   Review of Systems  Constitutional: Positive for fatigue. Negative for weakness.  HENT: Positive for mouth sores. Negative for mouth dryness and nose dryness.   Eyes: Negative for pain, redness, visual disturbance and dryness.  Respiratory: Negative for cough, hemoptysis, shortness of breath and difficulty breathing.   Cardiovascular: Negative for chest pain, palpitations, hypertension, irregular heartbeat and swelling in legs/feet.  Gastrointestinal: Positive for diarrhea. Negative for blood in stool and constipation.  Endocrine: Negative for increased urination.  Genitourinary: Negative for painful urination.    Musculoskeletal: Positive for arthralgias, joint pain and morning stiffness. Negative for joint swelling, myalgias, muscle weakness, muscle tenderness and myalgias.  Skin: Positive for rash. Negative for color change, hair loss, nodules/bumps, redness, skin tightness, ulcers and sensitivity to sunlight.  Neurological: Negative for dizziness, numbness and headaches.  Hematological: Negative for swollen glands.  Psychiatric/Behavioral: Negative for depressed mood and sleep disturbance. The patient is not nervous/anxious.     PMFS History:  Patient Active Problem List   Diagnosis Date Noted  . Atrial fibrillation with rapid ventricular response ([place]) 02/12/2017  . Hyperlipidemia 02/12/2017  . Pulmonary edema 02/12/2017  . Urgency of micturition 02/12/2017  . Osteopenia of multiple sites 07/23/2016  . Age-related osteoporosis without current pathological fracture 07/11/2016  . History of total knee replacement, bilateral 07/11/2016  . History of COPD 07/11/2016  . Spondylosis of lumbar region without myelopathy or radiculopathy 07/11/2016  . Primary osteoarthritis of both hands 07/11/2016  . Primary osteoarthritis of both feet 07/11/2016  . Vitamin D deficiency 07/11/2016  . History of vertebral fracture 07/11/2016  . History of IBS 07/11/2016  . Basal cell carcinoma 07/11/2016  . History of CVA (cerebrovascular accident)/ has metal implant not MRI safe  07/11/2016  . Other and unspecified hyperlipidemia 11/19/2012  . Cerebrovascular accident (stroke) ([place]) 11/19/2012  . Difficulty in walking(719.7) 11/06/2012  . Occlusion and stenosis of carotid artery without mention of cerebral infarction 02/07/2012  . OLD MYOCARDIAL INFARCTION 11/07/2009  . CAD, NATIVE VESSEL 11/07/2009  . Essential hypertension 09/28/2008  . IRRITABLE  BOWEL SYNDROME 09/28/2008  . DEGENERATIVE JOINT DISEASE, KNEE 09/28/2008  . POLYMYALGIA RHEUMATICA 09/28/2008  . Chest pain, unspecified 09/28/2008    Past  Medical History:  Diagnosis Date  . Anemia   . CAD in native artery    a. NSTEMI 12/2005 s/p BMS to Cx, prior normal LVEF.  Marland Kitchen Cancer ([place]) 1988 and  2009   BCC nose and  Melanoma back  . Cancer of skin Jan. 2014   [facility] Forehead and Nose  . Carotid stenosis    mild  . Chronic back pain   . CKD (chronic kidney disease), stage III ([place])   . Coronary atherosclerosis of unspecified type of vessel, native or graft   . DJD (degenerative joint disease) of knee   . History of TIA (transient ischemic attack)   . HTN (hypertension)   . IBS (irritable bowel syndrome)   . Myocardial infarction [facility])    5 yrs ago     dr wall  cardiac  . Osteoporosis   . Other diseases of lung, not elsewhere classified    lung nodule  . Polymyalgia rheumatica ([place])   . Shingles 01/2016-02/2016  . Stroke [facility])     Family History  Problem Relation Age of Onset  . Heart disease Father   . Hypertension Father   . Heart attack Father   . Cancer Mother        bladder   . Hypertension Daughter   . Atrial fibrillation Daughter   . Hypertension Son   . Colon cancer Neg Hx    Past Surgical History:  Procedure Laterality Date  . ABDOMINAL HYSTERECTOMY  1977   partial  . BACK SURGERY  1982  . CARDIAC CATHETERIZATION    . COLONOSCOPY N/A 01/27/2013   Procedure: COLONOSCOPY;  Surgeon: Rogene Houston, MD;  Location[facility];  Service: Endoscopy;  Laterality: N/A;  1030  . EYE SURGERY Bilateral 04/2017  . JOINT REPLACEMENT  2001 and  2011   6 knee surgeries   bil replacement  . KNEE ARTHROSCOPY    . LUMBAR LAMINECTOMY    . PR VEIN BYPASS GRAFT,AORTO-FEM-POP  2010  . removal of melanoma from back  2007  . SPINE SURGERY  2013   Broken back: fusion surgery  . tonisllectomy  1944  . total knee anthroplasty    . VERTEBROPLASTY  07/11/2011   Procedure: VERTEBROPLASTY;  Surgeon: Faythe Ghee, MD;  Location: [place] NEURO ORS;  Service: Neurosurgery;  Laterality: N/A;  Vertebroplasty of Lumbar Two   Social  History   Social History Narrative   Married, retired. 3 children. Regular exercise -yes; hobbies = swimming.     Objective: Vital Signs: BP 111/73 (BP Location: Left Arm, Patient Position: Sitting, Cuff Size: Normal)   Pulse 74   Resp 17   Ht 5\' 3"  (1.6 m)   Wt 160 lb (72.6 kg)   BMI 28.34 kg/m    Physical Exam  Constitutional: She is oriented to person, place, and time. She appears well-developed and well-nourished.  HENT:  Head: Normocephalic and atraumatic.  Eyes: Conjunctivae and EOM are normal.  Neck: Normal range of motion.  Cardiovascular: Normal rate, regular rhythm, normal heart sounds and intact distal pulses.  Pulmonary/Chest: Effort normal and breath sounds normal.  Abdominal: Soft. Bowel sounds are normal.  Lymphadenopathy:    She has no cervical adenopathy.  Neurological: She is alert and oriented to person, place, and time.  Skin: Skin is warm and dry. Capillary refill takes  less than 2 seconds.  Psychiatric: She has a normal mood and affect. Her behavior is normal.  Nursing note and vitals reviewed.    Musculoskeletal Exam: C-spine good ROM.  She has limited ROM of thoracic and lumbar spine.  She has no midline spinal tenderness or SI joint tenderness.  Shoulder joints, elbow joints, wrist joints, MCPs, PIPs, and DIPs good ROM with no synovitis.  She has synovial thickening of all PIP and DIP joints.  She has limited left hip ROM.  No warmth or effusion of bilateral knees.  MTPs, PIPs, and DIPs good ROM with no synovitis.  No trochanteric bursitis.  CDAI Exam: No CDAI exam completed.    Investigation: No additional findings. CBC Latest Ref Rng & Units 02/14/2017 02/13/2017 02/12/2017  WBC 4.0 - 10.5 K/uL 6.7 5.6 7.2  Hemoglobin 12.0 - 15.0 g/dL 11.9(L) 12.0 12.1  Hematocrit 36.0 - 46.0 % 37.5 36.9 37.5  Platelets 150 - 400 K/uL 320 295 327   CMP Latest Ref Rng & Units 02/14/2017 02/13/2017 02/12/2017  Glucose 65 - 99 mg/dL 97 98 133(H)  BUN 6 - 20  mg/dL 28(H) 25(H) 23(H)  Creatinine 0.44 - 1.00 mg/dL 1.55(H) 1.49(H) 1.44(H)  Sodium 135 - 145 mmol/L 139 139 143  Potassium 3.5 - 5.1 mmol/L 4.2 3.9 3.6  Chloride 101 - 111 mmol/L 103 101 106  CO2 22 - 32 mmol/L 27 30 25   Calcium 8.9 - 10.3 mg/dL 8.6(L) 9.0 9.2  Total Protein 6.0 - 8.5 g/dL - - -  Total Bilirubin 0.0 - 1.2 mg/dL - - -  Alkaline Phos 39 - 117 IU/L - - -  AST 0 - 40 IU/L - - -  ALT 0 - 32 IU/L - - -    Imaging: No results found.  Speciality Comments: No specialty comments available.    Procedures:  No procedures performed Allergies: Epinephrine; Clopidogrel bisulfate; and Oxycodone   Assessment / Plan:     Visit Diagnoses: Polymyalgia rheumatica ([place]) - She has no upper or lower extremity weakness on exam today.  She has full ROM with no discomfort of bilateral shoulders.  She is having left hip pain and limited internal and external rotation, but she has evidence of moderate osteoarthritis of her left hip found on x-ray at her last visit on 03/18/17. A sedimentation rate was obtained today to assess for a flare.  She has been in remission for many years, and she has been off of long-term Prednisone for 15 years, per patient.  She started a Prednisone taper on 03/18/17.  Patient is worried her PMR is starting to flare. Plan: Sedimentation rate  Primary osteoarthritis of both hands: She has PIP and DIP synovial thickening consistent with osteoarthritis.  Joint protection and muscle strengthening were discussed.  She performs hand exercises regularly at her water aerobics classes.  She denies any tenderness on exam today.   Primary osteoarthritis of both feet: She has PIP and DIP synovial thickening consistent with osteoarthritis.  She wears proper fitting shoes.    Unilateral primary osteoarthritis, left hip: She has significant discomfort in her left groin with ROM.  She has moderate osteoarthritis evident on x-ray at her previous visit.  Discussed referral to  orthopedist for an intra-articular cortisone injection.    Spondylosis of lumbar region without myelopathy or radiculopathy -A referral was made for her to see Dr. French Ana who has performed her previous back surgeries.  She is having increased pain in her lumbar region.  She had no  radiation of pain or midline spinal tenderness at this time.  Plan: Ambulatory referral to Orthopedic Surgery  Age-related osteoporosis without current pathological fracture: He is on calcium and vitamin D supplements.   History of vertebral fracture  History of total knee replacement, bilateral: She experiences discomfort in bilateral knees.  She has no warmth or effusion on exam.  She walks with a walker.     Vitamin D deficiency: She takes calcium and vitamin D supplements.   Other medical conditions are listed as follows:   Basal cell carcinoma (BCC), unspecified site  History of IBS  History of CVA (cerebrovascular accident)/ has metal implant not MRI safe   Essential hypertension  Hyperlipidemia, unspecified hyperlipidemia type  Atrial fibrillation with rapid ventricular response ([place]): on Xarelto   Orders: Orders Placed This Encounter  Procedures  . Sedimentation rate  . Ambulatory referral to Orthopedic Surgery   No orders of the defined types were placed in this encounter.   Face-to-face time spent with patient was 30  minutes. Greater than 50% of time was spent in counseling and coordination of care.  Follow-Up Instructions: Return in about 5 months (around 10/24/2017) for Polymyalgia Rheumatica, Osteoarthritis.   Ofilia Neas, PA-C  Note - This record has been created using Dragon software.  Chart creation errors have been sought, but may not always  have been located. Such creation errors do not reflect on  the standard of medical care.

## 2017-05-27 ENCOUNTER — Ambulatory Visit (INDEPENDENT_AMBULATORY_CARE_PROVIDER_SITE_OTHER): Payer: Medicare Other | Admitting: Physician Assistant

## 2017-05-27 ENCOUNTER — Encounter: Payer: Self-pay | Admitting: Physician Assistant

## 2017-05-27 VITALS — BP 111/73 | HR 74 | Resp 17 | Ht 63.0 in | Wt 160.0 lb

## 2017-05-27 DIAGNOSIS — M19071 Primary osteoarthritis, right ankle and foot: Secondary | ICD-10-CM

## 2017-05-27 DIAGNOSIS — M810 Age-related osteoporosis without current pathological fracture: Secondary | ICD-10-CM

## 2017-05-27 DIAGNOSIS — M353 Polymyalgia rheumatica: Secondary | ICD-10-CM

## 2017-05-27 DIAGNOSIS — M47816 Spondylosis without myelopathy or radiculopathy, lumbar region: Secondary | ICD-10-CM | POA: Diagnosis not present

## 2017-05-27 DIAGNOSIS — Z96653 Presence of artificial knee joint, bilateral: Secondary | ICD-10-CM

## 2017-05-27 DIAGNOSIS — E785 Hyperlipidemia, unspecified: Secondary | ICD-10-CM

## 2017-05-27 DIAGNOSIS — M19041 Primary osteoarthritis, right hand: Secondary | ICD-10-CM | POA: Diagnosis not present

## 2017-05-27 DIAGNOSIS — Z8719 Personal history of other diseases of the digestive system: Secondary | ICD-10-CM

## 2017-05-27 DIAGNOSIS — I10 Essential (primary) hypertension: Secondary | ICD-10-CM

## 2017-05-27 DIAGNOSIS — E559 Vitamin D deficiency, unspecified: Secondary | ICD-10-CM | POA: Diagnosis not present

## 2017-05-27 DIAGNOSIS — M19042 Primary osteoarthritis, left hand: Secondary | ICD-10-CM

## 2017-05-27 DIAGNOSIS — Z8781 Personal history of (healed) traumatic fracture: Secondary | ICD-10-CM

## 2017-05-27 DIAGNOSIS — C4491 Basal cell carcinoma of skin, unspecified: Secondary | ICD-10-CM

## 2017-05-27 DIAGNOSIS — I4891 Unspecified atrial fibrillation: Secondary | ICD-10-CM

## 2017-05-27 DIAGNOSIS — Z8673 Personal history of transient ischemic attack (TIA), and cerebral infarction without residual deficits: Secondary | ICD-10-CM | POA: Diagnosis not present

## 2017-05-27 DIAGNOSIS — M1612 Unilateral primary osteoarthritis, left hip: Secondary | ICD-10-CM | POA: Diagnosis not present

## 2017-05-27 DIAGNOSIS — M19072 Primary osteoarthritis, left ankle and foot: Secondary | ICD-10-CM

## 2017-05-27 LAB — SEDIMENTATION RATE: Sed Rate: 63 mm/h — ABNORMAL HIGH (ref 0–30)

## 2017-05-28 ENCOUNTER — Telehealth: Payer: Self-pay | Admitting: Rheumatology

## 2017-05-28 ENCOUNTER — Other Ambulatory Visit: Payer: Self-pay | Admitting: Physician Assistant

## 2017-05-28 DIAGNOSIS — M353 Polymyalgia rheumatica: Secondary | ICD-10-CM

## 2017-05-28 MED ORDER — PREDNISONE 10 MG PO TABS: 10.0000 mg | ORAL_TABLET | Freq: Every day | ORAL | 0 refills | Status: DC

## 2017-05-28 NOTE — Telephone Encounter (Signed)
Brenda Hall spoke to patient. See previous telephone encounter.

## 2017-05-28 NOTE — Telephone Encounter (Signed)
I called patient to discuss her lab result performed yesterday 05/27/17. Her ESR is elevated to 63. Due to her history of PMR and hip pain at her visit yesterday, I sent in a prescription for Prednisone 10 mg daily for 30 days.  She will return in 1 month for an appointment to reassess and repeat sed rate.  Dr. Estanislado Pandy is aware the plan.  All questions were answered and the patient voiced an understanding of the plan.

## 2017-05-28 NOTE — Telephone Encounter (Signed)
Patient left a voicemail requesting her lab results.

## 2017-05-29 ENCOUNTER — Other Ambulatory Visit: Payer: Self-pay | Admitting: *Deleted

## 2017-05-29 ENCOUNTER — Encounter: Payer: Self-pay | Admitting: Family Medicine

## 2017-05-29 ENCOUNTER — Ambulatory Visit (INDEPENDENT_AMBULATORY_CARE_PROVIDER_SITE_OTHER): Payer: Medicare Other | Admitting: Family Medicine

## 2017-05-29 VITALS — BP 122/82 | Ht 63.0 in | Wt 158.8 lb

## 2017-05-29 DIAGNOSIS — M792 Neuralgia and neuritis, unspecified: Secondary | ICD-10-CM

## 2017-05-29 DIAGNOSIS — I10 Essential (primary) hypertension: Secondary | ICD-10-CM | POA: Diagnosis not present

## 2017-05-29 DIAGNOSIS — E785 Hyperlipidemia, unspecified: Secondary | ICD-10-CM | POA: Diagnosis not present

## 2017-05-29 DIAGNOSIS — I2510 Atherosclerotic heart disease of native coronary artery without angina pectoris: Secondary | ICD-10-CM

## 2017-05-29 DIAGNOSIS — I4891 Unspecified atrial fibrillation: Secondary | ICD-10-CM | POA: Diagnosis not present

## 2017-05-29 MED ORDER — ATORVASTATIN CALCIUM 80 MG PO TABS: 80.0000 mg | ORAL_TABLET | Freq: Every day | ORAL | 1 refills | Status: DC

## 2017-05-29 MED ORDER — AMLODIPINE BESYLATE 5 MG PO TABS: 5.0000 mg | ORAL_TABLET | Freq: Every day | ORAL | 3 refills | Status: DC

## 2017-05-29 NOTE — Progress Notes (Signed)
   Subjective:    Patient ID: Brenda Hall, female    DOB: 07/12/1930, 84 y.o.   MRN: [id number]   Hypertension   This is a new problem. The current episode started more than 1 year ago. Risk factors for coronary artery disease include post-menopausal state. Treatments tried: norvasc.   Just had flare of pmr, started pred today.  Sed rate was up to sixty.  States already feeling better on the prednisone.  Patient continues to take lipid medication regularly. No obvious side effects from it. Generally does not miss a dose. Prior blood work results are reviewed with patient. Patient continues to work on fat intake in diet  Blood pressure medicine and blood pressure levels reviewed today with patient. Compliant with blood pressure medicine. States does not miss a dose. No obvious side effects. Blood pressure generally good when checked elsewhere. Watching salt intake.   lPatient goes into rather protracted discussion regarding her coronary artery disease, atrial fibrillation,, and new medication proposed by urologistRecent cataract surgery       Review of Systems No headache, no major weight loss or weight gain, no chest pain no back pain abdominal pain no change in bowel habits complete ROS otherwise negative     Objective:   Physical Exam  Alert and oriented, vitals reviewed and stable, NAD ENT-TM's and ext canals WNL bilat via otoscopic exam Soft palate, tonsils and post pharynx WNL via oropharyngeal exam Neck-symmetric, no masses; thyroid nonpalpable and nontender Pulmonary-no tachypnea or accessory muscle use; Clear without wheezes via auscultation Card--no abnrml murmurs, rhythm reg and rate WNL Carotid pulses symmetric, without bruits       Assessment & Plan:  Impression hypertension good control discussed maintain same meds compliance discussed  2.  Hyperlipidemia importance of control discussed medication discussed patient to maintain  3.  Coronary artery  disease clinically stable  4.  Recent atrial fibrillation now resolved  Chronic arthritis medications refilled diet exercise discussed    Greater than 50% of this 25 minute face to face visit was spent in counseling and discussion and coordination of care regarding the above diagnosis/diagnosies

## 2017-05-31 ENCOUNTER — Other Ambulatory Visit: Payer: Self-pay | Admitting: Family Medicine

## 2017-05-31 DIAGNOSIS — I10 Essential (primary) hypertension: Secondary | ICD-10-CM | POA: Diagnosis not present

## 2017-05-31 DIAGNOSIS — E785 Hyperlipidemia, unspecified: Secondary | ICD-10-CM | POA: Diagnosis not present

## 2017-06-01 LAB — LIPID PANEL
CHOL/HDL RATIO: 2.2 ratio (ref 0.0–4.4)
CHOLESTEROL TOTAL: 163 mg/dL (ref 100–199)
HDL: 75 mg/dL (ref >39)
LDL CALC: 67 mg/dL (ref 0–99)
TRIGLYCERIDES: 103 mg/dL (ref 0–149)
VLDL Cholesterol Cal: 21 mg/dL (ref 5–40)

## 2017-06-01 LAB — HEPATIC FUNCTION PANEL
ALT: 11 IU/L (ref 0–32)
AST: 14 IU/L (ref 0–40)
Albumin: 3.8 g/dL (ref 3.5–4.7)
Alkaline Phosphatase: 67 IU/L (ref 39–117)
BILIRUBIN TOTAL: 0.4 mg/dL (ref 0.0–1.2)
BILIRUBIN, DIRECT: 0.11 mg/dL (ref 0.00–0.40)
Total Protein: 7.1 g/dL (ref 6.0–8.5)

## 2017-06-02 ENCOUNTER — Telehealth: Payer: Self-pay | Admitting: Family Medicine

## 2017-06-02 NOTE — Telephone Encounter (Signed)
yrly good idea even for older women , shingrix is new and improved, trouble is some iof the insur not covering, it is good idea if covered, we can write rx

## 2017-06-02 NOTE — Telephone Encounter (Signed)
Left message to return call 

## 2017-06-02 NOTE — Telephone Encounter (Signed)
Patient got a letter from [place] letting her know its time for her mammogram.  She wants to know if Dr. Richardson Landry thinks she needs one since she just had one last year?  Also, she wants to know if she needs another shingles shot?

## 2017-06-03 NOTE — Telephone Encounter (Signed)
Patient advised per Dr Richardson Landry: yearly mammo good idea even for older women , shingrix is new and improved, trouble is some iof the insur not covering, it is good idea if covered. Patient verbalized understanding.

## 2017-06-05 ENCOUNTER — Encounter: Payer: Self-pay | Admitting: Family Medicine

## 2017-06-10 DIAGNOSIS — D485 Neoplasm of uncertain behavior of skin: Secondary | ICD-10-CM | POA: Diagnosis not present

## 2017-06-10 DIAGNOSIS — H02122 Mechanical ectropion of right lower eyelid: Secondary | ICD-10-CM | POA: Diagnosis not present

## 2017-06-11 NOTE — Progress Notes (Signed)
Office Visit Note  Patient: Brenda Hall             Date of Birth: 06/15/1930           MRN: [id number]             PCP: [name], MD Referring: [name], MD Visit Date: 06/24/2017 Occupation: @GUAROCC @    Subjective:  Left hip pain  History of Present Illness: Brenda Hall is a 84 y.o. female with history of polymyalgia rheumatica, osteoarthritis, and osteoporosis.  She has been taking Prednisone 10 mg daily.  She states her weakness has improved since starting Prednisone.  She denies any upper extremity pain or weakness.  She states she is having severe sharp pain in her left hip.  She describes the pain as stabbing. She states that the pain was severe in her left hip and she was unable to walk 2-3 days ago.   She takes tylenol PRN for pain relief. She has been walking with her walker.  She states her pain is worse when she is walking and getting in and out of the car. She states she continues to go to water aerobics.   Activities of Daily Living:  Patient reports morning stiffness for 5  minutes.   Patient Denies nocturnal pain.  Difficulty dressing/grooming: Denies Difficulty climbing stairs: Reports Difficulty getting out of chair: Denies Difficulty using hands for taps, buttons, cutlery, and/or writing: Denies   Review of Systems  Constitutional: Negative for fatigue and weakness.  HENT: Positive for mouth dryness (Uses [employer] products). Negative for mouth sores, trouble swallowing, trouble swallowing and nose dryness.   Eyes: Positive for dryness. Negative for pain, redness and visual disturbance.  Respiratory: Negative for cough, hemoptysis, shortness of breath and difficulty breathing.   Cardiovascular: Negative for chest pain, palpitations, hypertension and swelling in legs/feet.  Gastrointestinal: Positive for diarrhea (Hx of IBS). Negative for blood in stool and constipation.  Endocrine: Negative for increased urination.  Genitourinary:  Negative for painful urination.  Musculoskeletal: Positive for arthralgias, gait problem, joint pain and morning stiffness. Negative for joint swelling, myalgias, muscle weakness, muscle tenderness and myalgias.  Skin: Negative for color change, pallor, rash, hair loss, nodules/bumps, skin tightness, ulcers and sensitivity to sunlight.  Allergic/Immunologic: Negative for susceptible to infections.  Neurological: Negative for dizziness, numbness and headaches.  Hematological: Negative for swollen glands.  Psychiatric/Behavioral: Negative for depressed mood and sleep disturbance. The patient is not nervous/anxious.     PMFS History:  Patient Active Problem List   Diagnosis Date Noted  . Atrial fibrillation with rapid ventricular response ([place]) 02/12/2017  . Hyperlipidemia 02/12/2017  . Pulmonary edema 02/12/2017  . Urgency of micturition 02/12/2017  . Osteopenia of multiple sites 07/23/2016  . Age-related osteoporosis without current pathological fracture 07/11/2016  . History of total knee replacement, bilateral 07/11/2016  . History of COPD 07/11/2016  . Spondylosis of lumbar region without myelopathy or radiculopathy 07/11/2016  . Primary osteoarthritis of both hands 07/11/2016  . Primary osteoarthritis of both feet 07/11/2016  . Vitamin D deficiency 07/11/2016  . History of vertebral fracture 07/11/2016  . History of IBS 07/11/2016  . Basal cell carcinoma 07/11/2016  . History of CVA (cerebrovascular accident)/ has metal implant not MRI safe  07/11/2016  . Other and unspecified hyperlipidemia 11/19/2012  . Cerebrovascular accident (stroke) ([place]) 11/19/2012  . Difficulty in walking(719.7) 11/06/2012  . Occlusion and stenosis of carotid artery without mention of cerebral infarction 02/07/2012  . OLD  MYOCARDIAL INFARCTION 11/07/2009  . CAD, NATIVE VESSEL 11/07/2009  . Essential hypertension 09/28/2008  . IRRITABLE BOWEL SYNDROME 09/28/2008  . DEGENERATIVE JOINT DISEASE, KNEE  09/28/2008  . POLYMYALGIA RHEUMATICA 09/28/2008  . Chest pain, unspecified 09/28/2008    Past Medical History:  Diagnosis Date  . Anemia   . CAD in native artery    a. NSTEMI 12/2005 s/p BMS to Cx, prior normal LVEF.  Marland Kitchen Cancer ([place]) 1988 and  2009   BCC nose and  Melanoma back  . Cancer of skin Jan. 2014   [facility] [place]head [facility] Nose  . Carotid stenosis    mild  . Chronic back pain   . CKD (chronic kidney disease), stage III ([place])   . Coronary atherosclerosis of unspecified type of vessel, native or graft   . DJD (degenerative joint disease) of knee   . History of TIA (transient ischemic attack)   . HTN (hypertension)   . IBS (irritable bowel syndrome)   . Myocardial infarction [facility])    5 yrs ago     dr wall  cardiac  . Osteoporosis   . Other diseases of lung, not elsewhere classified    lung nodule  . Polymyalgia rheumatica ([place])   . Shingles 01/2016-02/2016  . Stroke [facility])     Family History  Problem Relation Age of Onset  . Heart disease Father   . Hypertension Father   . Heart attack Father   . Cancer Mother        bladder   . Hypertension Daughter   . Atrial fibrillation Daughter   . Hypertension Son   . Colon cancer Neg Hx    Past Surgical History:  Procedure Laterality Date  . ABDOMINAL HYSTERECTOMY  1977   partial  . BACK SURGERY  1982  . CARDIAC CATHETERIZATION    . COLONOSCOPY N/A 01/27/2013   Procedure: COLONOSCOPY;  Surgeon: Rogene Houston, MD;  Location: [facility];  Service: Endoscopy;  Laterality: N/A;  1030  . EYE SURGERY Bilateral 04/2017  . JOINT REPLACEMENT  2001 and  2011   6 knee surgeries   bil replacement  . KNEE ARTHROSCOPY    . LUMBAR LAMINECTOMY    . PR VEIN BYPASS GRAFT,AORTO-FEM-POP  2010  . removal of melanoma from back  2007  . SPINE SURGERY  2013   Broken back: fusion surgery  . tonisllectomy  1944  . total knee anthroplasty    . VERTEBROPLASTY  07/11/2011   Procedure: VERTEBROPLASTY;  Surgeon: Faythe Ghee, MD;  Location: [place]  NEURO ORS;  Service: Neurosurgery;  Laterality: N/A;  Vertebroplasty of Lumbar Two   Social History   Social History Narrative   Married, retired. 3 children. Regular exercise -yes; hobbies = swimming.     Objective: Vital Signs: BP (!) 149/84 (BP Location: Left Arm, Patient Position: Sitting, Cuff Size: Small)   Pulse 94   Resp 12   Wt 162 lb (73.5 kg)   BMI 28.70 kg/m    Physical Exam  Constitutional: She is oriented to person, place, and time. She appears well-developed and well-nourished.  HENT:  Head: Normocephalic and atraumatic.  Eyes: Conjunctivae and EOM are normal.  Neck: Normal range of motion.  Cardiovascular: Normal rate, regular rhythm, normal heart sounds and intact distal pulses.  Pulmonary/Chest: Effort normal and breath sounds normal.  Abdominal: Soft. Bowel sounds are normal.  Lymphadenopathy:    She has no cervical adenopathy.  Neurological: She is alert and oriented to person, place, and  time.  Skin: Skin is warm and dry. Capillary refill takes less than 2 seconds.  Psychiatric: She has a normal mood and affect. Her behavior is normal.  Nursing note and vitals reviewed.    Musculoskeletal Exam: C-spine, thoracic, and lumbar spine good ROM.  Shoulder joints, elbow joints, wrist joints, MCPs, PIPs, and DIPs good ROM with no synovitis.  DIP synovial thickening bilaterally.  Bilateral CMC joint synovial thickening consistent with osteoarthritis.  Hip joints, knee joints, ankle joints, MTPs, PIPs, and DIPs good ROM with no synovitis.  No warmth or effusion of knees.  She has discomfort with left hip ROM. No tenderness of trochanteric bursitis.   CDAI Exam: No CDAI exam completed.    Investigation: No additional findings. CBC Latest Ref Rng & Units 02/14/2017 02/13/2017 02/12/2017  WBC 4.0 - 10.5 K/uL 6.7 5.6 7.2  Hemoglobin 12.0 - 15.0 g/dL 11.9(L) 12.0 12.1  Hematocrit 36.0 - 46.0 % 37.5 36.9 37.5  Platelets 150 - 400 K/uL 320 295 327   CMP Latest Ref  Rng & Units 05/31/2017 02/14/2017 02/13/2017  Glucose 65 - 99 mg/dL - 97 98  BUN 6 - 20 mg/dL - 28(H) 25(H)  Creatinine 0.44 - 1.00 mg/dL - 1.55(H) 1.49(H)  Sodium 135 - 145 mmol/L - 139 139  Potassium 3.5 - 5.1 mmol/L - 4.2 3.9  Chloride 101 - 111 mmol/L - 103 101  CO2 22 - 32 mmol/L - 27 30  Calcium 8.9 - 10.3 mg/dL - 8.6(L) 9.0  Total Protein 6.0 - 8.5 g/dL 7.1 - -  Total Bilirubin 0.0 - 1.2 mg/dL 0.4 - -  Alkaline Phos 39 - 117 IU/L 67 - -  AST 0 - 40 IU/L 14 - -  ALT 0 - 32 IU/L 11 - -    Imaging: No results found.  Speciality Comments: No specialty comments available.    Procedures:  No procedures performed Allergies: Epinephrine; Clopidogrel bisulfate; and Oxycodone   Assessment / Plan:     Visit Diagnoses: Polymyalgia rheumatica ([place]) -She has been on Prednisone 10 mg for 1 month.  She has noticed improvement.  She denies any muscle weakness. Her most recent sed Rate was 63 on 05/27/17.  Sed rate was checked today in the office.  She continues to have severe left hip pain but her pain is due to moderate osteoarthritis of the left hip. - Plan: Sedimentation rate  On prednisone therapy - Prednisone 10 mg daily. She will be scheduled for a DEXA.   Primary osteoarthritis of left hip - She has significant discomfort with ROM.  She has severe pain in her left hip.  She has been walking with a walker.  A referral was placed to Dr. Ernestina Patches for evaluation and treatment of her left hip. Plan: Ambulatory referral to Physical Medicine Rehab  Primary osteoarthritis of both hands: She has DIP synovial thickening consistent with osteoarthritis.  She also has CMC synovial thickening.  Joint protection and muscle strengthening.    Primary osteoarthritis of both feet: She has no discomfort at this time.  Discussed the importance of wearing proper fitting shoes.  History of total knee replacement, bilateral: Doing well.  She has no warmth or effusion on exam.   Spondylosis of lumbar region  without myelopathy or radiculopathy - She has no midline spinal tenderness at this time.  Her pain is coming from her left hip.  She was referred to Dr. Ernestina Patches.    Age-related osteoporosis without current pathological fracture - She will be scheduled  for a DEXA.  Her most recent DEXA was 05/23/15 and her T-score was -1.6.   She takes calcium and vitamin D supplement. Plan: DG BONE DENSITY (DXA)  Vitamin D deficiency: She takes vitamin D supplements.   History of vertebral fracture: DEXA scan ordered today.   Other medical conditions are listed as follows:   History of CVA (cerebrovascular accident)/ has metal implant not MRI safe   History of COPD  History of IBS  Basal cell carcinoma (BCC), unspecified site: She is scheduled for a biopsy of a lesion next to her right eye on 07/17/17.   Atrial fibrillation with rapid ventricular response ([place]) - On Xarelto   Essential hypertension     Orders: Orders Placed This Encounter  Procedures  . DG BONE DENSITY (DXA)  . Ambulatory referral to Physical Medicine Rehab   No orders of the defined types were placed in this encounter.   Face-to-face time spent with patient was 30 minutes. >50% of time was spent in counseling and coordination of care.  Follow-Up Instructions: No Follow-up on file.   Ofilia Neas, PA-C  Note - This record has been created using Dragon software.  Chart creation errors have been sought, but may not always  have been located. Such creation errors do not reflect on  the standard of medical care.

## 2017-06-17 ENCOUNTER — Telehealth: Payer: Self-pay | Admitting: Cardiology

## 2017-06-17 MED ORDER — AMLODIPINE BESYLATE 5 MG PO TABS: 5.0000 mg | ORAL_TABLET | Freq: Every day | ORAL | 3 refills | Status: DC

## 2017-06-17 NOTE — Telephone Encounter (Signed)
°*  STAT* If patient is at the pharmacy, call can be transferred to refill team.   1. Which medications need to be refilled? (please list name of each medication and dose if known)  amLODipine (NORVASC) 5 MG tablet [[id number]]   2. Which pharmacy/location (including street and city if local pharmacy) is medication to be sent to: [facility]  3. Do they need a 30 day or 90 day supply?  90 day

## 2017-06-24 ENCOUNTER — Encounter: Payer: Self-pay | Admitting: Physician Assistant

## 2017-06-24 ENCOUNTER — Ambulatory Visit (INDEPENDENT_AMBULATORY_CARE_PROVIDER_SITE_OTHER): Payer: Medicare Other | Admitting: Physician Assistant

## 2017-06-24 VITALS — BP 149/84 | HR 94 | Resp 12 | Wt 162.0 lb

## 2017-06-24 DIAGNOSIS — E559 Vitamin D deficiency, unspecified: Secondary | ICD-10-CM

## 2017-06-24 DIAGNOSIS — I4891 Unspecified atrial fibrillation: Secondary | ICD-10-CM

## 2017-06-24 DIAGNOSIS — M353 Polymyalgia rheumatica: Secondary | ICD-10-CM | POA: Diagnosis not present

## 2017-06-24 DIAGNOSIS — Z8673 Personal history of transient ischemic attack (TIA), and cerebral infarction without residual deficits: Secondary | ICD-10-CM

## 2017-06-24 DIAGNOSIS — Z8709 Personal history of other diseases of the respiratory system: Secondary | ICD-10-CM | POA: Diagnosis not present

## 2017-06-24 DIAGNOSIS — Z96653 Presence of artificial knee joint, bilateral: Secondary | ICD-10-CM | POA: Diagnosis not present

## 2017-06-24 DIAGNOSIS — Z8781 Personal history of (healed) traumatic fracture: Secondary | ICD-10-CM | POA: Diagnosis not present

## 2017-06-24 DIAGNOSIS — M19072 Primary osteoarthritis, left ankle and foot: Secondary | ICD-10-CM

## 2017-06-24 DIAGNOSIS — M1612 Unilateral primary osteoarthritis, left hip: Secondary | ICD-10-CM | POA: Diagnosis not present

## 2017-06-24 DIAGNOSIS — M19041 Primary osteoarthritis, right hand: Secondary | ICD-10-CM | POA: Diagnosis not present

## 2017-06-24 DIAGNOSIS — M19042 Primary osteoarthritis, left hand: Secondary | ICD-10-CM

## 2017-06-24 DIAGNOSIS — M810 Age-related osteoporosis without current pathological fracture: Secondary | ICD-10-CM | POA: Diagnosis not present

## 2017-06-24 DIAGNOSIS — C4491 Basal cell carcinoma of skin, unspecified: Secondary | ICD-10-CM

## 2017-06-24 DIAGNOSIS — M47816 Spondylosis without myelopathy or radiculopathy, lumbar region: Secondary | ICD-10-CM | POA: Diagnosis not present

## 2017-06-24 DIAGNOSIS — Z7952 Long term (current) use of systemic steroids: Secondary | ICD-10-CM | POA: Diagnosis not present

## 2017-06-24 DIAGNOSIS — M19071 Primary osteoarthritis, right ankle and foot: Secondary | ICD-10-CM

## 2017-06-24 DIAGNOSIS — I10 Essential (primary) hypertension: Secondary | ICD-10-CM

## 2017-06-24 DIAGNOSIS — Z8719 Personal history of other diseases of the digestive system: Secondary | ICD-10-CM

## 2017-06-24 LAB — SEDIMENTATION RATE: SED RATE: 14 mm/h (ref 0–30)

## 2017-06-25 ENCOUNTER — Telehealth: Payer: Self-pay | Admitting: *Deleted

## 2017-06-25 MED ORDER — PREDNISONE 5 MG PO TABS: 5.0000 mg | ORAL_TABLET | Freq: Every day | ORAL | 0 refills | Status: DC

## 2017-06-25 MED ORDER — PREDNISONE 1 MG PO TABS: 4.0000 mg | ORAL_TABLET | Freq: Every day | ORAL | 0 refills | Status: DC

## 2017-06-25 NOTE — Telephone Encounter (Signed)
-----   Message from Ofilia Neas, PA-C sent at 06/25/2017  8:17 AM EST ----- Sed rate WNL.  Please advise patient to taper prednisone by 1 mg every month.

## 2017-06-25 NOTE — Progress Notes (Signed)
Sed rate WNL.  Please advise patient to taper prednisone by 1 mg every month.

## 2017-07-10 ENCOUNTER — Telehealth: Payer: Self-pay | Admitting: Family Medicine

## 2017-07-10 MED ORDER — TIZANIDINE HCL 4 MG PO CAPS: 4.0000 mg | ORAL_CAPSULE | Freq: Three times a day (TID) | ORAL | 0 refills | Status: DC

## 2017-07-10 NOTE — Telephone Encounter (Signed)
Pt is needing something called in for leg cramps. Pt states that Dr. Richardson Landry has called something in for this in the past. Pt is going out of town on Sat and needs something to be called in by tomorrow. Please advise.    Brenda Hall

## 2017-07-10 NOTE — Telephone Encounter (Signed)
zanaflex 4mg  numb 24 one qhs prn cramps

## 2017-07-10 NOTE — Telephone Encounter (Signed)
I sent in the medications and left the pt a message stating we have called in the medication to [facility] as requested if any questions call our office.

## 2017-07-14 ENCOUNTER — Telehealth: Payer: Self-pay | Admitting: Rheumatology

## 2017-07-14 MED ORDER — PREDNISONE 5 MG PO TABS: 5.0000 mg | ORAL_TABLET | Freq: Every day | ORAL | 0 refills | Status: DC

## 2017-07-14 MED ORDER — PREDNISONE 1 MG PO TABS: 3.0000 mg | ORAL_TABLET | Freq: Every day | ORAL | 0 refills | Status: DC

## 2017-07-14 NOTE — Telephone Encounter (Signed)
Last visit: 06/24/17 Next Visit: 10/02/17  Okay to refill per Dr. Estanislado Pandy

## 2017-07-14 NOTE — Telephone Encounter (Signed)
Patient called requesting prescription refills for both Prednisone 1 mg tablet and Prednisone 5 mg tablet.  Patient's pharmacy is [facility] on [address] in [place].

## 2017-07-17 DIAGNOSIS — H04561 Stenosis of right lacrimal punctum: Secondary | ICD-10-CM | POA: Diagnosis not present

## 2017-07-17 DIAGNOSIS — C44111 Basal cell carcinoma of skin of unspecified eyelid, including canthus: Secondary | ICD-10-CM | POA: Diagnosis not present

## 2017-07-17 DIAGNOSIS — C441122 Basal cell carcinoma of skin of right lower eyelid, including canthus: Secondary | ICD-10-CM | POA: Diagnosis not present

## 2017-07-18 ENCOUNTER — Encounter (INDEPENDENT_AMBULATORY_CARE_PROVIDER_SITE_OTHER): Payer: Self-pay | Admitting: Physical Medicine and Rehabilitation

## 2017-07-18 ENCOUNTER — Ambulatory Visit (INDEPENDENT_AMBULATORY_CARE_PROVIDER_SITE_OTHER): Payer: Medicare Other

## 2017-07-18 ENCOUNTER — Ambulatory Visit (INDEPENDENT_AMBULATORY_CARE_PROVIDER_SITE_OTHER): Payer: Medicare Other | Admitting: Physical Medicine and Rehabilitation

## 2017-07-18 DIAGNOSIS — I2510 Atherosclerotic heart disease of native coronary artery without angina pectoris: Secondary | ICD-10-CM | POA: Diagnosis not present

## 2017-07-18 DIAGNOSIS — M5441 Lumbago with sciatica, right side: Secondary | ICD-10-CM

## 2017-07-18 DIAGNOSIS — M25552 Pain in left hip: Secondary | ICD-10-CM

## 2017-07-18 DIAGNOSIS — M961 Postlaminectomy syndrome, not elsewhere classified: Secondary | ICD-10-CM | POA: Diagnosis not present

## 2017-07-18 DIAGNOSIS — M5136 Other intervertebral disc degeneration, lumbar region: Secondary | ICD-10-CM | POA: Diagnosis not present

## 2017-07-18 DIAGNOSIS — G8929 Other chronic pain: Secondary | ICD-10-CM

## 2017-07-18 MED ORDER — TIZANIDINE HCL 4 MG PO CAPS: 4.0000 mg | ORAL_CAPSULE | Freq: Three times a day (TID) | ORAL | 0 refills | Status: DC

## 2017-07-18 NOTE — Patient Instructions (Signed)

## 2017-07-18 NOTE — Progress Notes (Signed)
 .  Numeric Pain Rating Scale and Functional Assessment Average Pain 10   In the last MONTH (on 0-10 scale) has pain interfered with the following?  1. General activity like being  able to carry out your everyday physical activities such as walking, climbing stairs, carrying groceries, or moving a chair?  Rating(10)   +Driver, +BT Xarelto, -Dye Allergies.

## 2017-07-18 NOTE — Progress Notes (Signed)
Brenda Hall - 84 y.o. female MRN [id number]  Date of birth: 11/02/1930  Office Visit Note: Visit Date: 07/18/2017 PCP: [name], MD Referred by: [name], MD  Subjective: No chief complaint on file.  HPI: [name] is an interesting 84 year old female who comes in today with her friend who acted as a driver to bring her here.  I interviewed the patient by herself however in the exam room prior to the injection.  Unfortunately she was coming in today for a planned left intra-articular hip anesthetic arthrogram.  She had seen Dr. Arlean Hall assistant Brenda Sams, PA-C and at that time was having left hip and groin pain consistent with likely hip pathology and etiology of her pain.  X-rays were taken that did show symmetric arthritis of the left hip joint compared to the right.  Since that time [name] reminds me that it took a month for her to get to see me it seems to have progressed into full out right and left low back and bilateral hip pain.  The left side is still more hip and groin pain worse with standing and the right side is more right low back pain worse with any kind of movement and standing.  Since she saw Dr. Arlean Hall assistant there is been no focal trauma or bowel or bladder dysfunction or any new findings other than just increasing worsening severe pain.  The patient has a history of polymyalgia rheumatica.  She is been on a tapering down level of prednisone.  She has a history of prior lumbar surgeries potentially by Dr. Hal Hall who performed a vertebroplasty at L2-3 back in 2013.  There is an MRI from 2013 by Dr. Sherley Hall.  She reports having had injections in her lumbar spine under sedation many years ago.  She reports that they helped a little bit.  She denies any focal weakness but says she is having a difficult time ambulating because of the pain.  She is using a rolling walker.  She reports otherwise being usually fairly active.  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  She does not carry a diagnosis of depression her dementia.  She has had a history of prior stroke and there is some evidence on the record that she may not be able to have an MRI.  A CT scan of the lumbar spine had been done by Dr. Estanislado Hall last year.  This is reviewed below.  Her primary care physician is Dr. Wolfgang Hall and he had given her type tizanidine which she has been taking and said helps a little bit.  She is on Xarelto.  She does carry a diagnosis of an allergy to oxycodone.   Review of Systems  Constitutional: Negative for chills, fever, malaise/fatigue and weight loss.  HENT: Negative for hearing loss and sinus pain.   Eyes: Negative for blurred vision, double vision and photophobia.  Respiratory: Negative for cough and shortness of breath.   Cardiovascular: Negative for chest pain, palpitations and leg swelling.  Gastrointestinal: Negative for abdominal pain, nausea and vomiting.  Genitourinary: Negative for flank pain.  Musculoskeletal: Positive for back pain and joint pain. Negative for myalgias.  Skin: Negative for itching and rash.  Neurological: Negative for tremors, focal weakness and weakness.  Endo/Heme/Allergies: Negative.   Psychiatric/Behavioral: Negative for depression.  All other systems reviewed and are negative.  Otherwise per HPI.  Assessment & Plan: Visit Diagnoses:  1. Pain in left hip   2. Chronic  right-sided low back pain with right-sided sciatica   3. Degenerative disc disease, lumbar   4. Post laminectomy syndrome     Plan: Findings:  Chronic worsening severe left hip and groin pain consistent with left hip pathology.  She does have painful range of motion on the left with internal rotation this reproduces pain.  X-ray imaging reveals symmetric arthritis of this joint.  Unfortunately she is also having pain on the right side really at about the L4 region.  There is no swelling or induration around the  paraspinal region.  She has had no real red flag symptoms of the just increasing pain all over the low back.  This seems to be may be more radicular type pain and could be a flareup of stenosis which was seen on the CT scan and L3-4 which is right below the level where she had kyphoplasty.  She has multilevel arthritis as well as degenerative disc changes.  She is difficult to talk to to some degree in terms of explaining what were finding and the fact that I had her come in again just for a simple hip injection at this point is really turned into something a little bit bigger and more of a problem given the blood thinners and prior history of surgery etc.  I think the best approach is a left intra-articular anesthetic hip arthrogram and hopefully get her some relief from that standpoint.  The next step would be epidural injection likely on the right at L3 from a transforaminal approach.  We could do this with her on the Xarelto and we briefly talked about the risk of that.  She has had injections in the past.  Depending on relief obtained she may need a new CT scan or myelogram of the lumbar spine or referral back to [facility]surgery at [facility].    Meds & Orders:  Meds ordered this encounter  Medications  . DISCONTD: tiZANidine (ZANAFLEX) 4 MG capsule    Sig: Take 1 capsule (4 mg total) by mouth 3 (three) times daily.    Dispense:  24 capsule    Refill:  0    Orders Placed This Encounter  Procedures  . Large Joint Inj: L hip joint  . XR C-ARM NO REPORT    Follow-up: Return if symptoms worsen or fail to improve, for Dr. Estanislado Hall.   Procedures: Large Joint Inj: L hip joint on 07/18/2017 9:28 AM Indications: pain and diagnostic evaluation Details: 22 G needle, anterior approach  Arthrogram: Yes  Medications: 3 mL bupivacaine 0.5 %; 80 mg triamcinolone acetonide 40 MG/ML Aspirate: 3 mL yellow Outcome: tolerated well, no immediate complications  Arthrogram  demonstrated excellent flow of contrast throughout the joint surface without extravasation or obvious defect.  The patient had some mild relief of symptoms during the anesthetic phase of the injection.  It was hard for her to tell the amount of relief because right side was bothering her just as much.  Procedure, treatment alternatives, risks and benefits explained, specific risks discussed. Consent was given by the patient. Immediately prior to procedure a time out was called to verify the correct patient, procedure, equipment, support staff and site/side marked as required. Patient was prepped and draped in the usual sterile fashion.      No notes on file   Clinical History: CT LUMBAR SPINE WITHOUT CONTRAST  TECHNIQUE: Multidetector CT imaging of the lumbar spine was performed without intravenous contrast administration. Multiplanar CT image reconstructions were also generated.  COMPARISON:  Prior CT from 05/02/2011.  He  FINDINGS: Segmentation: Normal segmentation. Lowest well-formed disc is labeled the L5-S1 level.  Alignment: Alignment is stable with levoscoliosis and reversal of the normal lumbar lordosis, apex at L2-3.  Vertebrae: Remote compression fracture involving the L2 vertebral body with sequela prior vertebral augmentation. Cement present within the L2 vertebral body as well as the L2-3 disc. Small amount of cement has migrated posteriorly into the ventral epidural space at L2 as well. Prominent Schmorl's node at the inferior endplate of L3 with associated central height loss is stable from prior. Vertebral body heights are otherwise maintained. No acute fracture or identified.  Paraspinal and other soft tissues: Paraspinous soft tissues demonstrate no acute abnormality. Fatty atrophy noted within the lower paraspinous musculature. Visualized visceral structures within normal limits. Advanced aorto bi-iliac atherosclerotic disease. No aneurysm.  Disc  levels:  Spine is image from the T11-12 level through the sacrum.  T11-12: Diffuse degenerative disc bulge without focal disc protrusion. Moderate right with mild left facet arthropathy. No canal or foraminal stenosis.  T12-L1: Diffuse disc bulge with moderate bilateral facet arthropathy. No significant stenosis.  L1-2: Diffuse degenerative disc bulge with intervertebral disc space narrowing and disc desiccation. Moderate right with mild left facet arthropathy. Ligamentum flavum hypertrophy. No significant canal or foraminal encroachment.  L2-3: The remote compression fracture involving the L2 vertebral body with sequela prior vertebral augmentation. Extruded cement within the ventral epidural space posterior to L2. Degenerative intervertebral disc space narrowing with posterior disc bulge, slightly eccentric to the left. Flattening of the thecal sac with resultant moderate stenosis. Superimposed moderate bilateral facet arthropathy. Mild bilateral foraminal narrowing related to osteophytic spurring at the right L2-3 facet and endplate osteophytosis.  L3-4: Diffuse degenerative intervertebral disc space narrowing with disc desiccation. Prominent endplate Schmorl's node at the inferior endplate of L3 3 mm spondylolisthesis of L3 on L4 is stable. Associated diffuse disc bulge, eccentric to the right. Moderate to advanced bilateral facet arthrosis, left greater than right. Resultant moderate to severe canal stenosis is grossly similar to previous. Thecal sac measures 7 mm in AP diameter. No significant foraminal encroachment.  L4-5: Ankylosis of the L4 and L5 vertebral bodies again seen, stable. Patient is status post remote right hemi laminectomy. No residual stenosis.  L5-S1: Degenerative intervertebral disc space narrowing with disc desiccation. Probable similar central/ right subarticular disc protrusion Right-sided posterior osteophytic spurring encroaching upon  the right lateral recess and neural foramen. Associated posterior disc bulge. Possible impingement of the right S1 nerve root in the right lateral recess. Severe right with more moderate left foraminal narrowing, progressed.  IMPRESSION: 1. No acute fracture or other osseous abnormality within the lumbar spine. 2. Remote compression fracture involving the L2 vertebral body with sequela of prior vertebral augmentation. Similar moderate canal stenosis at this level. 3. Stable moderate to severe canal stenosis at L3-4 related to disc bulge and facet arthropathy. 4. Similar posterior disc protrusion at L5-S1 with superimposed osteophytic spurring, encroaching upon the right lateral recess, and potentially affecting the right S1 nerve root. Moderate left with severe right foraminal stenosis at this level is the slightly progressed.   Electronically Signed   By: Jeannine Boga M.D.   On: 01/02/2016 13:52   She reports that she has never smoked. She has never used smokeless tobacco. No results for input(s): HGBA1C, LABURIC in the last 8760 hours.  Objective:  VS:  HT:    WT:   BMI:     BP:  HR: bpm  TEMP: ( )  RESP:  Physical Exam  Constitutional: She is oriented to person, place, and time. She appears well-developed and well-nourished. She appears distressed.  HENT:  Head: Normocephalic and atraumatic.  Nose: Nose normal.  Mouth/Throat: Oropharynx is clear and moist.  Eyes: Pupils are equal, round, and reactive to light. Conjunctivae are normal.  Healing ulceration on the lower eyelid on the right from basal cell excision  Neck: Normal range of motion. Neck supple.  Cardiovascular: Regular rhythm and intact distal pulses.  Pulmonary/Chest: Effort normal. No respiratory distress.  Abdominal: She exhibits no distension. There is no guarding.  Musculoskeletal:  Patient walks very slowly with a forward flexed spine using a rolling walker.  Any sort of movement going from  sit to stand her extension is wildly exacerbating her pain.  Any movement with the hips on either side gives her a great deal of pain which is almost exaggerating to an extent.  She has painful touching to both greater trochanters.  She however has good distal strength without clonus.  Neurological: She is alert and oriented to person, place, and time. She exhibits normal muscle tone. Coordination normal.  Skin: Skin is warm. No rash noted. No erythema.  Psychiatric: She has a normal mood and affect. Her behavior is normal.  Nursing note and vitals reviewed.   Ortho Exam Imaging: Xr C-arm No Report  Result Date: 07/21/2017 Please see Notes or Procedures tab for imaging impression.   Past Medical/Family/Surgical/Social History: Medications & Allergies reviewed per EMR, new medications updated. Patient Active Problem List   Diagnosis Date Noted  . Atrial fibrillation with rapid ventricular response ([place]) 02/12/2017  . Hyperlipidemia 02/12/2017  . Pulmonary edema 02/12/2017  . Urgency of micturition 02/12/2017  . Osteopenia of multiple sites 07/23/2016  . Age-related osteoporosis without current pathological fracture 07/11/2016  . History of total knee replacement, bilateral 07/11/2016  . History of COPD 07/11/2016  . Spondylosis of lumbar region without myelopathy or radiculopathy 07/11/2016  . Primary osteoarthritis of both hands 07/11/2016  . Primary osteoarthritis of both feet 07/11/2016  . Vitamin D deficiency 07/11/2016  . History of vertebral fracture 07/11/2016  . History of IBS 07/11/2016  . Basal cell carcinoma 07/11/2016  . History of CVA (cerebrovascular accident)/ has metal implant not MRI safe  07/11/2016  . Other and unspecified hyperlipidemia 11/19/2012  . Cerebrovascular accident (stroke) ([place]) 11/19/2012  . Difficulty in walking(719.7) 11/06/2012  . Occlusion and stenosis of carotid artery without mention of cerebral infarction 02/07/2012  . OLD MYOCARDIAL  INFARCTION 11/07/2009  . CAD, NATIVE VESSEL 11/07/2009  . Essential hypertension 09/28/2008  . IRRITABLE BOWEL SYNDROME 09/28/2008  . DEGENERATIVE JOINT DISEASE, KNEE 09/28/2008  . POLYMYALGIA RHEUMATICA 09/28/2008  . Chest pain, unspecified 09/28/2008   Past Medical History:  Diagnosis Date  . Anemia   . CAD in native artery    a. NSTEMI 12/2005 s/p BMS to Cx, prior normal LVEF.  Marland Kitchen Cancer ([place]) 1988 and  2009   BCC nose and  Melanoma back  . Cancer of skin Jan. 2014   [facility] N[facility]  . Carotid stenosis    mild  . Chronic back pain   . CKD (chronic kidney disease), stage III ([place])   . Coronary atherosclerosis of unspecified type of vessel, native or graft   . DJD (degenerative joint disease) of knee   . History of TIA (transient ischemic attack)   . HTN (hypertension)   . IBS (irritable bowel syndrome)   .  Myocardial infarction [facility])    5 yrs ago     dr wall  cardiac  . Osteoporosis   . Other diseases of lung, not elsewhere classified    lung nodule  . Polymyalgia rheumatica ([place])   . Shingles 01/2016-02/2016  . Stroke [facility])    Family History  Problem Relation Age of Onset  . Heart disease Father   . Hypertension Father   . Heart attack Father   . Cancer Mother        bladder   . Hypertension Daughter   . Atrial fibrillation Daughter   . Hypertension Son   . Colon cancer Neg Hx    Past Surgical History:  Procedure Laterality Date  . ABDOMINAL HYSTERECTOMY  1977   partial  . BACK SURGERY  1982  . CARDIAC CATHETERIZATION    . COLONOSCOPY N/A 01/27/2013   Procedure: COLONOSCOPY;  Surgeon: Rogene Houston, MD;  Location: [facility];  Service: Endoscopy;  Laterality: N/A;  1030  . EYE SURGERY Bilateral 04/2017  . JOINT REPLACEMENT  2001 and  2011   6 knee surgeries   bil replacement  . KNEE ARTHROSCOPY    . LUMBAR LAMINECTOMY    . PR VEIN BYPASS GRAFT,AORTO-FEM-POP  2010  . removal of melanoma from back  2007  . SPINE SURGERY  2013   Broken back:  fusion surgery  . tonisllectomy  1944  . total knee anthroplasty    . VERTEBROPLASTY  07/11/2011   Procedure: VERTEBROPLASTY;  Surgeon: Faythe Ghee, MD;  Location: [place] NEURO ORS;  Service: Neurosurgery;  Laterality: N/A;  Vertebroplasty of Lumbar Two   Social History   Occupational History  . Not on file  Tobacco Use  . Smoking status: Never Smoker  . Smokeless tobacco: Never Used  . Tobacco comment: tobacco use - no  Substance and Sexual Activity  . Alcohol use: Yes    Alcohol/week: 0.0 oz    Comment: occasionally a glass of wine or margarita per pt  . Drug use: No  . Sexual activity: Not on file

## 2017-07-21 ENCOUNTER — Ambulatory Visit (INDEPENDENT_AMBULATORY_CARE_PROVIDER_SITE_OTHER): Payer: Medicare Other

## 2017-07-21 ENCOUNTER — Encounter (INDEPENDENT_AMBULATORY_CARE_PROVIDER_SITE_OTHER): Payer: Self-pay | Admitting: Physical Medicine and Rehabilitation

## 2017-07-21 ENCOUNTER — Ambulatory Visit (INDEPENDENT_AMBULATORY_CARE_PROVIDER_SITE_OTHER): Payer: Medicare Other | Admitting: Physical Medicine and Rehabilitation

## 2017-07-21 VITALS — BP 163/79 | HR 96 | Temp 97.6°F

## 2017-07-21 DIAGNOSIS — M5416 Radiculopathy, lumbar region: Secondary | ICD-10-CM

## 2017-07-21 DIAGNOSIS — M48062 Spinal stenosis, lumbar region with neurogenic claudication: Secondary | ICD-10-CM

## 2017-07-21 MED ORDER — TIZANIDINE HCL 4 MG PO CAPS: 4.0000 mg | ORAL_CAPSULE | Freq: Three times a day (TID) | ORAL | 0 refills | Status: DC

## 2017-07-21 MED ORDER — BUPIVACAINE HCL 0.5 % IJ SOLN
3.0000 mL | INTRAMUSCULAR | Status: AC | PRN
  Administered 2017-07-18: 3 mL via INTRA_ARTICULAR

## 2017-07-21 MED ORDER — BETAMETHASONE SOD PHOS & ACET 6 (3-3) MG/ML IJ SUSP
12.0000 mg | Freq: Once | INTRAMUSCULAR | Status: AC
  Administered 2017-07-21: 12 mg

## 2017-07-21 MED ORDER — TRIAMCINOLONE ACETONIDE 40 MG/ML IJ SUSP
80.0000 mg | INTRAMUSCULAR | Status: AC | PRN
  Administered 2017-07-18: 80 mg via INTRA_ARTICULAR

## 2017-07-21 NOTE — Progress Notes (Signed)
.  Numeric Pain Rating Scale and Functional Assessment Average Pain 10   In the last MONTH (on 0-10 scale) has pain interfered with the following?  1. General activity like being  able to carry out your everyday physical activities such as walking, climbing stairs, carrying groceries, or moving a chair?  Rating(10)     

## 2017-07-21 NOTE — Patient Instructions (Signed)

## 2017-07-21 NOTE — Procedures (Signed)
Lumbosacral Transforaminal Epidural Steroid Injection - Sub-Pedicular Approach with Fluoroscopic Guidance  Patient: Brenda Hall      Date of Birth: 10-17-1930 MRN: [id number] PCP: [name], MD      Visit Date: 07/21/2017   Universal Protocol:    Date/Time: 07/21/2017  Consent Given By: the patient  Position: PRONE  Additional Comments: Vital signs were monitored before and after the procedure. Patient was prepped and draped in the usual sterile fashion. The correct patient, procedure, and site was verified.   Injection Procedure Details:  Procedure Site One Meds Administered:  Meds ordered this encounter  Medications  . betamethasone acetate-betamethasone sodium phosphate (CELESTONE) injection 12 mg  . tiZANidine (ZANAFLEX) 4 MG capsule    Sig: Take 1 capsule (4 mg total) by mouth 3 (three) times daily.    Dispense:  24 capsule    Refill:  0    Laterality: Right  Location/Site:  L3-L4  Needle size: 29 G  Needle type: Spinal  Needle Placement: Transforaminal  Findings:    -Comments: As dictated in the note we were able to needle down to the opening of the foramen but we were not able to really deliver any injectate into the epidural space or around the nerve root.  We did inject lidocaine down to the spot.  Procedure Details: After squaring off the end-plates to get a true AP view, the C-arm was positioned so that an oblique view of the foramen as noted above was visualized. The target area is just inferior to the "nose of the scotty dog" or sub pedicular. The soft tissues overlying this structure were infiltrated with 2-3 ml. of 1% Lidocaine without Epinephrine.  The spinal needle was inserted toward the target using a "trajectory" view along the fluoroscope beam.  Under AP and lateral visualization, the needle was advanced down to the opening of the foramen.  Unfortunately the patient was incredibly intolerant to any movement R needling throughout  the musculature and when we were near the area of the foramen she was uncomfortable to the point that we did abort the injection.   Additional Comments:  No complications occurred The procedure was aborted given the patient not tolerating the procedure Dressing: Band-Aid    Post-procedure details: Patient was observed during the procedure. Post-procedure instructions were reviewed.  Patient left the clinic in stable condition.

## 2017-07-21 NOTE — Progress Notes (Signed)
Brenda Hall - 84 y.o. female MRN [id number]  Date of birth: [birth year]  Office Visit Note: Visit Date: 07/21/2017 PCP: [name], MD Referred by: [name], MD  Subjective: Chief Complaint  Patient presents with  . Lower Back - Pain   HPI: [name] is an 84 year old female who comes in today for planned right L3 transforaminal epidural steroid injection.  We saw her on Friday in my notes can be reviewed for further details and justification.  Basically we were seeing her initially for what we thought was a simple left hip injection under fluoroscopic guidance which turned into the fact that over the last month she has had severe worsening lower back pain on the right side it does seem to radiate.  She is gotten some relief from the left hip injection however that was also getting somewhat better when she saw me on Friday.  She has had no fevers or chills or night sweats.  She has had no unintended weight loss.  She has had no focal weakness.  She is having a difficult time walking with a lot of pain and she gets pain with laying flat as well.  She is using ice which seems to help and is having a lot of spasm.  She has been taking tizanidine which was first given to her by Dr. Wolfgang Phoenix I have refilled that.  She is on Xarelto and we had a long talk today about the risk of doing a transforaminal injection with Xarelto on board we had a brief talk amount Friday and she does want to go along with the injection.  She has had prior injections done with sedation many years ago.  She has a prior kyphoplasty at L2-3 and she has a lumbar spine CT scan which is reviewed again below with stenosis at L3-4 and multilevel facet arthropathy and foraminal narrowing as well as degenerative disc changes.  She has had no new trauma.   ROS Otherwise per HPI.  Assessment & Plan: Visit Diagnoses:  1. Lumbar radiculopathy   2. Spinal stenosis of lumbar region with neurogenic claudication      Plan: Findings:  I attempted to complete a right L3 transforaminal epidural steroid injection today.  She had difficulty lying on the exam table but when she did lie flat we were able to start the injection without difficulty in terms of imaging.  The patient has an exaggerated pain response even to numbing the scan and needling into the musculature area.  Despite continued numbing medicine and talking to her throughout the procedure she just could not tolerate doing the injection.  I did get the needle down to the opening of the foramen at that point the patient was having really just not well controlled pain level and really more anxiety than anything else.  We had let her use the preprocedure ties Jacobo Forest that she had but this did not seem to make much difference.  I did abort the procedure at that point and we did probably complete 90% of the procedure.  We did not inject any medication other than numbing medicine.  I am going to write a referral to Dr. Suella Broad at [facility] to see if he would complete a diagnostic and therapeutic right L3 transforaminal injection or any injection to his choosing under sedation for her.  If he cannot help her that I would try to get her back to see the neurosurgeon [facility] or  perhaps Dr. Maryjean Ka or Dr. Brien Few there.  Depending on the referral effort she may need repeat CT scan or myelogram of the lumbar spine.    Meds & Orders:  Meds ordered this encounter  Medications  . betamethasone acetate-betamethasone sodium phosphate (CELESTONE) injection 12 mg  . tiZANidine (ZANAFLEX) 4 MG capsule    Sig: Take 1 capsule (4 mg total) by mouth 3 (three) times daily.    Dispense:  24 capsule    Refill:  0    Orders Placed This Encounter  Procedures  . XR C-ARM NO REPORT  . Ambulatory referral to Physical Medicine Rehab  . Epidural Steroid injection    Follow-up: Return if symptoms worsen or fail to improve, for Dr.  Estanislado Pandy.   Procedures: No procedures performed  Lumbosacral Transforaminal Epidural Steroid Injection - Sub-Pedicular Approach with Fluoroscopic Guidance  Patient: Brenda Hall      Date of Birth: 04/14/1931 MRN: [id number] PCP: [name], MD      Visit Date: 07/21/2017   Universal Protocol:    Date/Time: 07/21/2017  Consent Given By: the patient  Position: PRONE  Additional Comments: Vital signs were monitored before and after the procedure. Patient was prepped and draped in the usual sterile fashion. The correct patient, procedure, and site was verified.   Injection Procedure Details:  Procedure Site One Meds Administered:  Meds ordered this encounter  Medications  . betamethasone acetate-betamethasone sodium phosphate (CELESTONE) injection 12 mg  . tiZANidine (ZANAFLEX) 4 MG capsule    Sig: Take 1 capsule (4 mg total) by mouth 3 (three) times daily.    Dispense:  24 capsule    Refill:  0    Laterality: Right  Location/Site:  L3-L4  Needle size: 32 G  Needle type: Spinal  Needle Placement: Transforaminal  Findings:    -Comments: As dictated in the note we were able to needle down to the opening of the foramen but we were not able to really deliver any injectate into the epidural space or around the nerve root.  We did inject lidocaine down to the spot.  Procedure Details: After squaring off the end-plates to get a true AP view, the C-arm was positioned so that an oblique view of the foramen as noted above was visualized. The target area is just inferior to the "nose of the scotty dog" or sub pedicular. The soft tissues overlying this structure were infiltrated with 2-3 ml. of 1% Lidocaine without Epinephrine.  The spinal needle was inserted toward the target using a "trajectory" view along the fluoroscope beam.  Under AP and lateral visualization, the needle was advanced down to the opening of the foramen.  Unfortunately the patient was  incredibly intolerant to any movement R needling throughout the musculature and when we were near the area of the foramen she was uncomfortable to the point that we did abort the injection.   Additional Comments:  No complications occurred The procedure was aborted given the patient not tolerating the procedure Dressing: Band-Aid    Post-procedure details: Patient was observed during the procedure. Post-procedure instructions were reviewed.  Patient left the clinic in stable condition.    Clinical History: CT LUMBAR SPINE WITHOUT CONTRAST  TECHNIQUE: Multidetector CT imaging of the lumbar spine was performed without intravenous contrast administration. Multiplanar CT image reconstructions were also generated.  COMPARISON:  Prior CT from 05/02/2011.  He  FINDINGS: Segmentation: Normal segmentation. Lowest well-formed disc is labeled the L5-S1 level.  Alignment: Alignment is stable with levoscoliosis  and reversal of the normal lumbar lordosis, apex at L2-3.  Vertebrae: Remote compression fracture involving the L2 vertebral body with sequela prior vertebral augmentation. Cement present within the L2 vertebral body as well as the L2-3 disc. Small amount of cement has migrated posteriorly into the ventral epidural space at L2 as well. Prominent Schmorl's node at the inferior endplate of L3 with associated central height loss is stable from prior. Vertebral body heights are otherwise maintained. No acute fracture or identified.  Paraspinal and other soft tissues: Paraspinous soft tissues demonstrate no acute abnormality. Fatty atrophy noted within the lower paraspinous musculature. Visualized visceral structures within normal limits. Advanced aorto bi-iliac atherosclerotic disease. No aneurysm.  Disc levels:  Spine is image from the T11-12 level through the sacrum.  T11-12: Diffuse degenerative disc bulge without focal disc protrusion. Moderate right with mild  left facet arthropathy. No canal or foraminal stenosis.  T12-L1: Diffuse disc bulge with moderate bilateral facet arthropathy. No significant stenosis.  L1-2: Diffuse degenerative disc bulge with intervertebral disc space narrowing and disc desiccation. Moderate right with mild left facet arthropathy. Ligamentum flavum hypertrophy. No significant canal or foraminal encroachment.  L2-3: The remote compression fracture involving the L2 vertebral body with sequela prior vertebral augmentation. Extruded cement within the ventral epidural space posterior to L2. Degenerative intervertebral disc space narrowing with posterior disc bulge, slightly eccentric to the left. Flattening of the thecal sac with resultant moderate stenosis. Superimposed moderate bilateral facet arthropathy. Mild bilateral foraminal narrowing related to osteophytic spurring at the right L2-3 facet and endplate osteophytosis.  L3-4: Diffuse degenerative intervertebral disc space narrowing with disc desiccation. Prominent endplate Schmorl's node at the inferior endplate of L3 3 mm spondylolisthesis of L3 on L4 is stable. Associated diffuse disc bulge, eccentric to the right. Moderate to advanced bilateral facet arthrosis, left greater than right. Resultant moderate to severe canal stenosis is grossly similar to previous. Thecal sac measures 7 mm in AP diameter. No significant foraminal encroachment.  L4-5: Ankylosis of the L4 and L5 vertebral bodies again seen, stable. Patient is status post remote right hemi laminectomy. No residual stenosis.  L5-S1: Degenerative intervertebral disc space narrowing with disc desiccation. Probable similar central/ right subarticular disc protrusion Right-sided posterior osteophytic spurring encroaching upon the right lateral recess and neural foramen. Associated posterior disc bulge. Possible impingement of the right S1 nerve root in the right lateral recess. Severe right  with more moderate left foraminal narrowing, progressed.  IMPRESSION: 1. No acute fracture or other osseous abnormality within the lumbar spine. 2. Remote compression fracture involving the L2 vertebral body with sequela of prior vertebral augmentation. Similar moderate canal stenosis at this level. 3. Stable moderate to severe canal stenosis at L3-4 related to disc bulge and facet arthropathy. 4. Similar posterior disc protrusion at L5-S1 with superimposed osteophytic spurring, encroaching upon the right lateral recess, and potentially affecting the right S1 nerve root. Moderate left with severe right foraminal stenosis at this level is the slightly progressed.   Electronically Signed   By: Jeannine Boga M.D.   On: 01/02/2016 13:52   She reports that she has never smoked. She has never used smokeless tobacco. No results for input(s): HGBA1C, LABURIC in the last 8760 hours.  Objective:  VS:  HT:    WT:   BMI:     BP:(!) 163/79  HR:96bpm  TEMP:97.6 F (36.4 C)( )  RESP:96 % Physical Exam  Constitutional: She is oriented to person, place, and time. She appears well-developed and well-nourished.  Eyes: Pupils are equal, round, and reactive to light. Conjunctivae and EOM are normal.  Cardiovascular: Normal rate and intact distal pulses.  Pulmonary/Chest: Effort normal.  Musculoskeletal:  Patient ambulates with a rolling walker with a forward flexed spine.  She has pain with extension of the lumbar spine she does have some paraspinal tightness and spasm.  She has painful range of motion really to both hips at all.  She does have tenderness across the lower spine even with skin palpation.  Neurological: She is alert and oriented to person, place, and time. She exhibits normal muscle tone.  Skin: Skin is warm and dry. No rash noted. No erythema.  Psychiatric: She has a normal mood and affect. Her behavior is normal.  Nursing note and vitals reviewed.   Ortho  Exam Imaging: Xr C-arm No Report  Result Date: 07/21/2017 Please see Notes or Procedures tab for imaging impression.   Past Medical/Family/Surgical/Social History: Medications & Allergies reviewed per EMR, new medications updated. Patient Active Problem List   Diagnosis Date Noted  . Atrial fibrillation with rapid ventricular response ([place]) 02/12/2017  . Hyperlipidemia 02/12/2017  . Pulmonary edema 02/12/2017  . Urgency of micturition 02/12/2017  . Osteopenia of multiple sites 07/23/2016  . Age-related osteoporosis without current pathological fracture 07/11/2016  . History of total knee replacement, bilateral 07/11/2016  . History of COPD 07/11/2016  . Spondylosis of lumbar region without myelopathy or radiculopathy 07/11/2016  . Primary osteoarthritis of both hands 07/11/2016  . Primary osteoarthritis of both feet 07/11/2016  . Vitamin D deficiency 07/11/2016  . History of vertebral fracture 07/11/2016  . History of IBS 07/11/2016  . Basal cell carcinoma 07/11/2016  . History of CVA (cerebrovascular accident)/ has metal implant not MRI safe  07/11/2016  . Other and unspecified hyperlipidemia 11/19/2012  . Cerebrovascular accident (stroke) ([place]) 11/19/2012  . Difficulty in walking(719.7) 11/06/2012  . Occlusion and stenosis of carotid artery without mention of cerebral infarction 02/07/2012  . OLD MYOCARDIAL INFARCTION 11/07/2009  . CAD, NATIVE VESSEL 11/07/2009  . Essential hypertension 09/28/2008  . IRRITABLE BOWEL SYNDROME 09/28/2008  . DEGENERATIVE JOINT DISEASE, KNEE 09/28/2008  . POLYMYALGIA RHEUMATICA 09/28/2008  . Chest pain, unspecified 09/28/2008   Past Medical History:  Diagnosis Date  . Anemia   . CAD in native artery    a. NSTEMI 12/2005 s/p BMS to Cx, prior normal LVEF.  Marland Kitchen Cancer ([place]) 1988 and  2009   BCC nose and  Melanoma back  . Cancer of skin Jan. 2014   [facility] Forehead and Nose  . Carotid stenosis    mild  . Chronic back pain   . CKD (chronic kidney  disease), stage III ([place])   . Coronary atherosclerosis of unspecified type of vessel, native or graft   . DJD (degenerative joint disease) of knee   . History of TIA (transient ischemic attack)   . HTN (hypertension)   . IBS (irritable bowel syndrome)   . Myocardial infarction [facility])    5 yrs ago     dr wall  cardiac  . Osteoporosis   . Other diseases of lung, not elsewhere classified    lung nodule  . Polymyalgia rheumatica ([place])   . Shingles 01/2016-02/2016  . Stroke [facility])    Family History  Problem Relation Age of Onset  . Heart disease Father   . Hypertension Father   . Heart attack Father   . Cancer Mother        bladder   . Hypertension Daughter   .  Atrial fibrillation Daughter   . Hypertension Son   . Colon cancer Neg Hx    Past Surgical History:  Procedure Laterality Date  . ABDOMINAL HYSTERECTOMY  1977   partial  . BACK SURGERY  1982  . CARDIAC CATHETERIZATION    . COLONOSCOPY N/A 01/27/2013   Procedure: COLONOSCOPY;  Surgeon: Rogene Houston, MD;  Location: [facility];  Service: Endoscopy;  Laterality: N/A;  1030  . EYE SURGERY Bilateral 04/2017  . JOINT REPLACEMENT  2001 and  2011   6 knee surgeries   bil replacement  . KNEE ARTHROSCOPY    . LUMBAR LAMINECTOMY    . PR VEIN BYPASS GRAFT,AORTO-FEM-POP  2010  . removal of melanoma from back  2007  . SPINE SURGERY  2013   Broken back: fusion surgery  . tonisllectomy  1944  . total knee anthroplasty    . VERTEBROPLASTY  07/11/2011   Procedure: VERTEBROPLASTY;  Surgeon: Faythe Ghee, MD;  Location: [place] [facility] ORS;  Service: Neurosurgery;  Laterality: N/A;  Vertebroplasty of Lumbar Two   Social History   Occupational History  . Not on file  Tobacco Use  . Smoking status: Never Smoker  . Smokeless tobacco: Never Used  . Tobacco comment: tobacco use - no  Substance and Sexual Activity  . Alcohol use: Yes    Alcohol/week: 0.0 oz    Comment: occasionally a glass of wine or margarita per pt  . Drug use: No   . Sexual activity: Not on file

## 2017-07-22 ENCOUNTER — Ambulatory Visit (INDEPENDENT_AMBULATORY_CARE_PROVIDER_SITE_OTHER): Payer: Self-pay | Admitting: Physical Medicine and Rehabilitation

## 2017-07-23 ENCOUNTER — Ambulatory Visit: Payer: Medicare Other | Admitting: Rheumatology

## 2017-07-24 ENCOUNTER — Telehealth: Payer: Self-pay | Admitting: Rheumatology

## 2017-07-24 NOTE — Telephone Encounter (Signed)
Patient called stating that she is in a lot of pain and waiting for an appointment with Dr. Nelva Bush.  Patient states that Dr. Ernestina Patches was unable to give her the shot in her back because "she was unable to tolerate the pain."  Patient states that Dr. Ernestina Patches referred her to Dr. Nelva Bush at [facility] to get injection with sedation, but they are waiting on the medication to arrive and approval from her insurance before they can schedule the appointment.

## 2017-07-24 NOTE — Telephone Encounter (Signed)
Patient called stating that she spoke with Dr. Nelva Bush office who scheduled her for 4/12 at 7:00 am.  Patient states that she is taking her muscle relaxers to give her some relief until she is able to get the shot.

## 2017-07-31 DIAGNOSIS — N3941 Urge incontinence: Secondary | ICD-10-CM | POA: Diagnosis not present

## 2017-07-31 DIAGNOSIS — R350 Frequency of micturition: Secondary | ICD-10-CM | POA: Diagnosis not present

## 2017-08-01 DIAGNOSIS — M5137 Other intervertebral disc degeneration, lumbosacral region: Secondary | ICD-10-CM | POA: Diagnosis not present

## 2017-08-01 DIAGNOSIS — M5117 Intervertebral disc disorders with radiculopathy, lumbosacral region: Secondary | ICD-10-CM | POA: Diagnosis not present

## 2017-08-01 DIAGNOSIS — M5416 Radiculopathy, lumbar region: Secondary | ICD-10-CM | POA: Diagnosis not present

## 2017-08-04 ENCOUNTER — Telehealth (INDEPENDENT_AMBULATORY_CARE_PROVIDER_SITE_OTHER): Payer: Self-pay | Admitting: Physical Medicine and Rehabilitation

## 2017-08-05 ENCOUNTER — Telehealth: Payer: Self-pay | Admitting: Rheumatology

## 2017-08-05 ENCOUNTER — Other Ambulatory Visit (INDEPENDENT_AMBULATORY_CARE_PROVIDER_SITE_OTHER): Payer: Self-pay | Admitting: Physical Medicine and Rehabilitation

## 2017-08-05 MED ORDER — TIZANIDINE HCL 4 MG PO CAPS: 4.0000 mg | ORAL_CAPSULE | Freq: Three times a day (TID) | ORAL | 0 refills | Status: DC

## 2017-08-05 NOTE — Telephone Encounter (Signed)
The injection can take up tp 10 days for effect, if no help then, then I would refer her back to her neurosurgeon ?Earle Gell for eval or could update MRI first. Would consider medication changes as well

## 2017-08-05 NOTE — Telephone Encounter (Signed)
Tylenol 2 caps TID, refilled tizanidine

## 2017-08-05 NOTE — Telephone Encounter (Signed)
Agree with the plan.

## 2017-08-05 NOTE — Telephone Encounter (Signed)
Patient states she had an injection in her back in L-4 on 08/01/17 by Dr. Nelva Bush. Patient states she is still having pain. Patient states she contacted Dr. Nelva Bush office and they advised her it contacted 3-10 days to start working. Patient states they did send in a muscle relaxer for her to take. Patient states that she is is also taking an over the counter pain reliever. Patient states she sleeping in a chair and using a walker. Patient states she is having trouble standing and sitting. Patient states she is icing and using heat. Please advise.

## 2017-08-05 NOTE — Telephone Encounter (Signed)
Patient left a voicemail stating that she had an injection with Dr. Nelva Bush on Friday 4/12 and does not feel any better.  Patient states she is not sure what to do next and in a lot of pain and states she needs someone to call her back asap.

## 2017-08-05 NOTE — Telephone Encounter (Signed)
Patient is still taking the muscle relaxers you prescribed, and she reports that they do help. She only has 10 left. Taking 500 mg Tylenol arthritis. She wants to know if she should increase that. [place].

## 2017-08-05 NOTE — Telephone Encounter (Signed)
Patient advised.

## 2017-08-15 ENCOUNTER — Telehealth: Payer: Self-pay | Admitting: Rheumatology

## 2017-08-15 ENCOUNTER — Telehealth (INDEPENDENT_AMBULATORY_CARE_PROVIDER_SITE_OTHER): Payer: Self-pay | Admitting: Physical Medicine and Rehabilitation

## 2017-08-15 DIAGNOSIS — M5416 Radiculopathy, lumbar region: Secondary | ICD-10-CM

## 2017-08-15 DIAGNOSIS — M48062 Spinal stenosis, lumbar region with neurogenic claudication: Secondary | ICD-10-CM

## 2017-08-15 NOTE — Telephone Encounter (Signed)
Needs Lspine MRI and/or  follow up with Dr. Sherley Bounds Neurosurgery.

## 2017-08-15 NOTE — Telephone Encounter (Signed)
Tell her to let us get the MRI and look at it and then talk referral to see who is more apropriate

## 2017-08-15 NOTE — Telephone Encounter (Signed)
Patient called stating that she is now 2 weeks post injection with Dr. Nelva Bush and is still in a lot of pain.  Patient states that "when she stands it feels like her core won't support her back."  Patient states that her feet are also swollen which she has been keeping elevated.  Patient states that she realizes the swelling might be due to her taking Prednisone.  Patient states that she is still taking the muscle relaxer that Dr. Estanislado Pandy prescribed but still cannot leave her house due to pain.

## 2017-08-15 NOTE — Telephone Encounter (Signed)
MRI ordered. Patient states that Dr. Ronnald Ramp will not see her again. She is asking for a referral to someone else. Please advise.

## 2017-08-15 NOTE — Telephone Encounter (Signed)
Patient states she contacted Dr. Romona Curls office and they stated they would get an MRI. Patient states Dr. Ronnald Ramp will not operate on her. Patient advised that if her pain becomes worse she may want to go to the emergency room .

## 2017-08-15 NOTE — Telephone Encounter (Signed)
She should contact Dr. Nelva Bush.  He may also consider follow-up with her neurosurgeon.

## 2017-08-18 ENCOUNTER — Other Ambulatory Visit: Payer: Self-pay | Admitting: Rheumatology

## 2017-08-19 NOTE — Telephone Encounter (Signed)
Last visit: 06/24/17 Next Visit: 10/02/17  Okay to refill per Dr. Estanislado Pandy

## 2017-08-20 ENCOUNTER — Telehealth: Payer: Self-pay | Admitting: Rheumatology

## 2017-08-20 NOTE — Telephone Encounter (Signed)
I called the above number.  Patient's daughter Brenda Hall was not available.  I spoke with patient, she stated that she had cortisone injection in her back by Dr. Nelva Bush.  She has had some relief from the cortisone injection.  She is also getting MRI of her lumbar spine tomorrow.  She states her daughter Brenda Hall was concerned because of pedal edema.  Patient believes that the pedal edema is coming from the prednisone use.  I have advised her to reduce salt intake.  I have also advised her to use compression socks if possible.  She will call me back after the results of MRI spine are available.

## 2017-08-20 NOTE — Telephone Encounter (Signed)
Patient's daughter Lattie Haw left a voicemail stating she would like to talk to Dr. Estanislado Pandy regarding her mom's condition.  Lattie Haw states her mom is currently on Prednisone and it is causing a lot of problems and would appreciate a return call.  [phone]

## 2017-08-21 ENCOUNTER — Ambulatory Visit (HOSPITAL_COMMUNITY)
Admission: RE | Admit: 2017-08-21 | Discharge: 2017-08-21 | Disposition: A | Discharge status: Home / Self Care | Payer: Medicare Other | Source: Ambulatory Visit | Attending: Physical Medicine and Rehabilitation | Admitting: Physical Medicine and Rehabilitation

## 2017-08-21 DIAGNOSIS — M4804 Spinal stenosis, thoracic region: Secondary | ICD-10-CM | POA: Diagnosis not present

## 2017-08-21 DIAGNOSIS — M5116 Intervertebral disc disorders with radiculopathy, lumbar region: Secondary | ICD-10-CM | POA: Insufficient documentation

## 2017-08-21 DIAGNOSIS — M419 Scoliosis, unspecified: Secondary | ICD-10-CM | POA: Diagnosis not present

## 2017-08-21 DIAGNOSIS — M4856XA Collapsed vertebra, not elsewhere classified, lumbar region, initial encounter for fracture: Secondary | ICD-10-CM | POA: Insufficient documentation

## 2017-08-21 DIAGNOSIS — M48062 Spinal stenosis, lumbar region with neurogenic claudication: Secondary | ICD-10-CM | POA: Insufficient documentation

## 2017-08-21 DIAGNOSIS — M5416 Radiculopathy, lumbar region: Secondary | ICD-10-CM

## 2017-08-21 DIAGNOSIS — M48061 Spinal stenosis, lumbar region without neurogenic claudication: Secondary | ICD-10-CM | POA: Diagnosis not present

## 2017-08-21 DIAGNOSIS — M4716 Other spondylosis with myelopathy, lumbar region: Secondary | ICD-10-CM | POA: Diagnosis not present

## 2017-08-21 NOTE — Telephone Encounter (Signed)
Patient had her MRI today and results were called to our office- also available in [place]. Please advise.

## 2017-08-21 NOTE — Telephone Encounter (Signed)
She has new T12 compression fracture with some movement into canal but no cord compression, she also has stenosis or narrow canal at L3-4 which is where Dr. Nelva Bush did his injection, as we directed based off x-ray.  She needs to see spine surgeon for evaluation and decide any next step - could be repeat injection by Dr. Nelva Bush if helped. Could possibly need T12 kyphoplasty/cement or L3-4 decompression.

## 2017-08-21 NOTE — Addendum Note (Signed)
Addended by: Sherre Scarlet B on: 08/21/2017 05:16 PM   Modules accepted: Orders

## 2017-08-27 ENCOUNTER — Encounter (INDEPENDENT_AMBULATORY_CARE_PROVIDER_SITE_OTHER): Payer: Self-pay | Admitting: Specialist

## 2017-08-27 ENCOUNTER — Ambulatory Visit (INDEPENDENT_AMBULATORY_CARE_PROVIDER_SITE_OTHER): Payer: Medicare Other | Admitting: Specialist

## 2017-08-27 VITALS — BP 138/85 | HR 91 | Ht 63.0 in | Wt 155.0 lb

## 2017-08-27 DIAGNOSIS — M8000XG Age-related osteoporosis with current pathological fracture, unspecified site, subsequent encounter for fracture with delayed healing: Secondary | ICD-10-CM

## 2017-08-27 DIAGNOSIS — I2510 Atherosclerotic heart disease of native coronary artery without angina pectoris: Secondary | ICD-10-CM

## 2017-08-27 DIAGNOSIS — S22080G Wedge compression fracture of T11-T12 vertebra, subsequent encounter for fracture with delayed healing: Secondary | ICD-10-CM

## 2017-08-27 DIAGNOSIS — M5136 Other intervertebral disc degeneration, lumbar region: Secondary | ICD-10-CM | POA: Diagnosis not present

## 2017-08-27 DIAGNOSIS — M4316 Spondylolisthesis, lumbar region: Secondary | ICD-10-CM

## 2017-08-27 DIAGNOSIS — M48062 Spinal stenosis, lumbar region with neurogenic claudication: Secondary | ICD-10-CM

## 2017-08-27 MED ORDER — GABAPENTIN 100 MG PO CAPS: 100.0000 mg | ORAL_CAPSULE | Freq: Every day | ORAL | 2 refills | Status: DC

## 2017-08-27 NOTE — Addendum Note (Signed)
Addended by: Basil Dess on: 08/27/2017 02:16 PM   Modules accepted: Orders

## 2017-08-27 NOTE — Patient Instructions (Addendum)
Avoid frequent bending and stooping  No lifting greater than 10 lbs. May use ice or moist heat for pain. Weight loss is of benefit. Handicap license is approved. Referral to Dr. Estanislado Pandy, Interventional Radiology for consideration of kyphoplasty T12.

## 2017-08-27 NOTE — Progress Notes (Addendum)
Office Visit Note   Patient: Brenda Hall           Date of Birth: 01-04-1931           MRN: [id number] Visit Date: 08/27/2017              Requested by: [name], [address] [place], [place] [address] PCP: [name], MD   Assessment & Plan: Visit Diagnoses:  1. Closed wedge compression fracture of twelfth thoracic vertebra with delayed healing, subsequent encounter   2. Spinal stenosis of lumbar region with neurogenic claudication   3. Spondylolisthesis, lumbar region   4. Degenerative disc disease, lumbar   5. Age-related osteoporosis with current pathological fracture with delayed healing, subsequent encounter   84 year old female with severe osteoporosis with pathologic compression fracture that is new at T12. Worsening claudication symptoms predated the compression fracture as she reports having to use a walker to attend a grandson's graduation this past 03/2017. She has in the past 2-3 months seen the pain in the back progress to being unable to sleep without sitting in a recliner and pain that is present with rolling over in bed. She has a significant kyphoscoliosis of the lumbar spine due to multilevel disc collapse and compression deformity T12, L2 and L3 as well as a autofusion at L4-5 with disc space narrowing. She uses hip flexion and knee flexion to compensate for the lumbar kyphosis. The acute change 2-3 months ago probably is due to the T12 compression fracture. She does have stenosis symptoms and this too may be worsened due to inability to  [employer] for the stenosis at L3-4 by flexion due to worsening pain at the fracture site T12. I recommend an attempt at kyphoplasty first before consideration of decompression with long Thoracolumbar fusion in the face of osteoporosis and high risk of complication with surgical management of the lumbar kyphoscoliosis with spinal stenosis.   Plan: Avoid frequent bending and stooping  No lifting  greater than 10 lbs. May use ice or moist heat for pain. Weight loss is of benefit. Handicap license is approved. Referral to Dr. Estanislado Pandy, Interventional Radiology for consideration of kyphoplasty T12.  Follow-Up Instructions: Return in about 3 weeks (around 09/17/2017).   Orders:  Orders Placed This Encounter  Procedures  . Ambulatory referral to Interventional Radiology   No orders of the defined types were placed in this encounter.     Procedures: No procedures performed   Clinical Data: No additional findings.   Subjective: Chief Complaint  Patient presents with  . Lower Back - Pain    84 year old female with history of back pain that is severe and she reports that the pain is such that she has had an interruption in her life, she is unable to  sleep unless she is in a chair or recliner. She is taking tylenol and resting without relief.  ESI was done by Dr. Ernestina Patches last month 07/21/2017 and it did not help. She reports she  can stand now and get up on her walker but can't walk a distance. She has a wedding of a granddaughter this fall and is concern she may not make it. She was unable to sing with the [place] choir 2 weeks ago for [employer]. Family brought her meals and Easter celebration. She has pain with rolling over in bed. Her legs feel very tired with standing and Walking. She has been in deep water aerobics and also stopped  this 2 months ago. Reports the pain It just started, after driving to [place] getting up the pain was there. History of back pain several years ago and had a vertebroplasty by Dr. Hal Neer 2013. She has been seen by Dr.Deveshwar and is being treated for osteoporosis and polymyalgia rheumatica. She is currently on 7 mg prednisone per day was previously on 10 mg. She reports that she did start using a walker 6 months ago and had to use a walker to attend a grandson's Graduation ceremony 6 months ago and has been having increasing symptoms in her legs  for several years now. She did drive to her appointment today. At age 10 she would like to Be able to remain independent. She lives at home in [place], a 54 year old grandson helps her. The grandson is employed in [place].   Review of Systems  Constitutional: Negative.   HENT: Negative.   Eyes: Negative.   Respiratory: Negative.   Cardiovascular: Negative.   Gastrointestinal: Negative.   Endocrine: Negative.   Genitourinary: Negative.   Musculoskeletal: Negative.   Skin: Negative.   Allergic/Immunologic: Negative.   Neurological: Negative.   Hematological: Negative.   Psychiatric/Behavioral: Negative.      Objective: Vital Signs: BP 138/85 (BP Location: Left Arm, Patient Position: Sitting)   Pulse 91   Ht 5\' 3"  (1.6 m)   Wt 155 lb (70.3 kg)   BMI 27.46 kg/m   Physical Exam  Constitutional: She is oriented to person, place, and time. She appears well-developed and well-nourished.  HENT:  Head: Normocephalic and atraumatic.  Eyes: Pupils are equal, round, and reactive to light. EOM are normal.  Neck: Normal range of motion. Neck supple.  Pulmonary/Chest: Effort normal and breath sounds normal.  Abdominal: Soft. Bowel sounds are normal.  Neurological: She is alert and oriented to person, place, and time.  Skin: Skin is warm and dry.  Psychiatric: She has a normal mood and affect. Her behavior is normal. Judgment and thought content normal.    Back Exam   Tenderness  The patient is experiencing tenderness in the lumbar.  Range of Motion  Extension: abnormal  Flexion: abnormal  Lateral bend right: abnormal  Lateral bend left: abnormal  Rotation right: abnormal  Rotation left: abnormal   Muscle Strength  Right Quadriceps:  5/5  Left Quadriceps:  5/5  Right Hamstrings:  5/5  Left Hamstrings:  5/5   Tests  Straight leg raise right: negative Straight leg raise left: negative  Reflexes  Patellar: 0/4 Achilles: 0/4 Biceps: 0/4 Babinski's sign: normal     Other  Toe walk: normal Heel walk: normal Sensation: normal Gait: normal  Erythema: no back redness Scars: absent      Specialty Comments:  No specialty comments available.  Imaging: No results found.   PMFS History: Patient Active Problem List   Diagnosis Date Noted  . Atrial fibrillation with rapid ventricular response ([place]) 02/12/2017  . Hyperlipidemia 02/12/2017  . Pulmonary edema 02/12/2017  . Urgency of micturition 02/12/2017  . Osteopenia of multiple sites 07/23/2016  . Age-related osteoporosis without current pathological fracture 07/11/2016  . History of total knee replacement, bilateral 07/11/2016  . History of COPD 07/11/2016  . Spondylosis of lumbar region without myelopathy or radiculopathy 07/11/2016  . Primary osteoarthritis of both hands 07/11/2016  . Primary osteoarthritis of both feet 07/11/2016  . Vitamin D deficiency 07/11/2016  . History of vertebral fracture 07/11/2016  . History of IBS 07/11/2016  . Basal cell carcinoma 07/11/2016  .  History of CVA (cerebrovascular accident)/ has metal implant not MRI safe  07/11/2016  . Other and unspecified hyperlipidemia 11/19/2012  . Cerebrovascular accident (stroke) ([place]) 11/19/2012  . Difficulty in walking(719.7) 11/06/2012  . Occlusion and stenosis of carotid artery without mention of cerebral infarction 02/07/2012  . OLD MYOCARDIAL INFARCTION 11/07/2009  . CAD, NATIVE VESSEL 11/07/2009  . Essential hypertension 09/28/2008  . IRRITABLE BOWEL SYNDROME 09/28/2008  . DEGENERATIVE JOINT DISEASE, KNEE 09/28/2008  . POLYMYALGIA RHEUMATICA 09/28/2008  . Chest pain, unspecified 09/28/2008   Past Medical History:  Diagnosis Date  . Anemia   . CAD in native artery    a. NSTEMI 12/2005 s/p BMS to Cx, prior normal LVEF.  Marland Kitchen Cancer ([place]) 1988 and  2009   BCC nose and  Melanoma back  . Cancer of skin Jan. 2014   [facility] Forehead and Nose  . Carotid stenosis    mild  . Chronic back pain   . CKD (chronic  kidney disease), stage III ([place])   . Coronary atherosclerosis of unspecified type of vessel, native or graft   . DJD (degenerative joint disease) of knee   . History of TIA (transient ischemic attack)   . HTN (hypertension)   . IBS (irritable bowel syndrome)   . Myocardial infarction [facility])    5 yrs ago     dr wall  cardiac  . Osteoporosis   . Other diseases of lung, not elsewhere classified    lung nodule  . Polymyalgia rheumatica ([place])   . Shingles 01/2016-02/2016  . Stroke [facility])     Family History  Problem Relation Age of Onset  . Heart disease Father   . Hypertension Father   . Heart attack Father   . Cancer Mother        bladder   . Hypertension Daughter   . Atrial fibrillation Daughter   . Hypertension Son   . Colon cancer Neg Hx     Past Surgical History:  Procedure Laterality Date  . ABDOMINAL HYSTERECTOMY  1977   partial  . BACK SURGERY  1982  . CARDIAC CATHETERIZATION    . COLONOSCOPY N/A 01/27/2013   Procedure: COLONOSCOPY;  Surgeon: Rogene Houston, MD;  [facility];  Service: Endoscopy;  Laterality: N/A;  1030  . EYE SURGERY Bilateral 04/2017  . JOINT REPLACEMENT  2001 and  2011   6 knee surgeries   bil replacement  . KNEE ARTHROSCOPY    . LUMBAR LAMINECTOMY    . PR VEIN BYPASS GRAFT,AORTO-FEM-POP  2010  . removal of melanoma from back  2007  . SPINE SURGERY  2013   Broken back: fusion surgery  . tonisllectomy  1944  . total knee anthroplasty    . VERTEBROPLASTY  07/11/2011   Procedure: VERTEBROPLASTY;  Surgeon: Faythe Ghee, MD;  Location: [place] NEURO ORS;  Service: Neurosurgery;  Laterality: N/A;  Vertebroplasty of Lumbar Two   Social History   Occupational History  . Not on file  Tobacco Use  . Smoking status: Never Smoker  . Smokeless tobacco: Never Used  . Tobacco comment: tobacco use - no  Substance and Sexual Activity  . Alcohol use: Yes    Alcohol/week: 0.0 oz    Comment: occasionally a glass of wine or margarita per pt  .  Drug use: No  . Sexual activity: Not on file

## 2017-08-28 ENCOUNTER — Other Ambulatory Visit (HOSPITAL_COMMUNITY): Payer: Self-pay | Admitting: Interventional Radiology

## 2017-08-28 DIAGNOSIS — S22080A Wedge compression fracture of T11-T12 vertebra, initial encounter for closed fracture: Secondary | ICD-10-CM

## 2017-09-02 ENCOUNTER — Encounter: Payer: Self-pay | Admitting: Cardiology

## 2017-09-02 ENCOUNTER — Ambulatory Visit (INDEPENDENT_AMBULATORY_CARE_PROVIDER_SITE_OTHER): Payer: Medicare Other | Admitting: Cardiology

## 2017-09-02 VITALS — BP 132/72 | HR 78 | Ht 66.0 in | Wt 158.0 lb

## 2017-09-02 DIAGNOSIS — I10 Essential (primary) hypertension: Secondary | ICD-10-CM

## 2017-09-02 DIAGNOSIS — E782 Mixed hyperlipidemia: Secondary | ICD-10-CM

## 2017-09-02 DIAGNOSIS — I4891 Unspecified atrial fibrillation: Secondary | ICD-10-CM

## 2017-09-02 DIAGNOSIS — R600 Localized edema: Secondary | ICD-10-CM | POA: Diagnosis not present

## 2017-09-02 DIAGNOSIS — I2510 Atherosclerotic heart disease of native coronary artery without angina pectoris: Secondary | ICD-10-CM

## 2017-09-02 MED ORDER — FUROSEMIDE 20 MG PO TABS: ORAL_TABLET | ORAL | 3 refills | Status: DC

## 2017-09-02 NOTE — Progress Notes (Signed)
Clinical Summary Brenda Hall is a 84 y.o.female seen today for follow up of the following medical problems.   1. CAD - prior NSTEMI 12/31/2005, she had a BMS placed to LCX. LVEF at that time 52% - echo 03/2008 LVEF normal, no LVEF given  - lisinopril stopped due to hyperkalemia - no recent chest pain - compliant with meds.   2. Hx of TIA - no recent symptoms - compliant with meds.    3, Hyperlipidemia - compliant with statin.  - 05/2017 TC 163 TG 103 HDL 75 LDL 67   4. HTN - norvasc 10mg  caused dizziness, she is back on 5mg  daily - lopressor at 100mg  caused fatigue. Change to Toprol XL 75mg  daily and tolerating well  - no recent dizziness. Compliant with meds  5. CKD III - followed at [facility].   6. Afib - new diagnosis during 01/2017 admissoin - CHADS2Vasc score is 7, started on xarelto - converted out of afib on her own.  - no recent palpitations.  - paying 40$ copay for xarelto which is manageable.   - no recent palpitations.    7. LE edema - recent issues with LE edema, reports she has recently been on prednisone.     SH: former Chartered certified accountant x 25 years.Husband by second marriage was a general in the [employer], had been in [employer] stationed in [place]. Gen. Loney Loh.      Past Medical History:  Diagnosis Date  . Anemia   . CAD in native artery    a. NSTEMI 12/2005 s/p BMS to Cx, prior normal LVEF.  Marland Kitchen Cancer ([place]) 1988 and  2009   BCC nose and  Melanoma back  . Cancer of skin Jan. 2014   [facility] Forehead and Nose  . Carotid stenosis    mild  . Chronic back pain   . CKD (chronic kidney disease), stage III ([place])   . Coronary atherosclerosis of unspecified type of vessel, native or graft   . DJD (degenerative joint disease) of knee   . History of TIA (transient ischemic attack)   . HTN (hypertension)   . IBS (irritable bowel syndrome)   . Myocardial infarction [facility])    5 yrs ago     dr wall   cardiac  . Osteoporosis   . Other diseases of lung, not elsewhere classified    lung nodule  . Polymyalgia rheumatica ([place])   . Shingles 01/2016-02/2016  . Stroke [facility])      Allergies  Allergen Reactions  . Epinephrine Other (See Comments)    During dental procedure-caused facial swelling at injection site. Pt has had epinephrine since then for nose and facial surgery and did fine with it per pt.  . Clopidogrel Bisulfate Itching and Rash    Plavix  . Oxycodone     hallucinations     Current Outpatient Medications  Medication Sig Dispense Refill  . amLODipine (NORVASC) 5 MG tablet Take 1 tablet (5 mg total) by mouth daily. 90 tablet 3  . aspirin 81 MG tablet Take 81 mg by mouth daily.    Marland Kitchen atorvastatin (LIPITOR) 80 MG tablet Take 1 tablet (80 mg total) by mouth at bedtime. 90 tablet 1  . atorvastatin (LIPITOR) 80 MG tablet atorvastatin 80 mg tablet  Take 1 tablet every day by oral route.    . Calcium Carbonate-Vit D-Min (CALCIUM 1200 PO) Take 1 tablet by mouth at bedtime.     Marland Kitchen estrogens, conjugated, (PREMARIN)  0.3 MG tablet Take 1 tablet (0.3 mg total) by mouth every evening.    . estrogens, conjugated, (PREMARIN) 0.3 MG tablet conjugated estrogens 0.3 mg tablet  Take 1 tablet every day by oral route.    . fluticasone (FLONASE) 50 MCG/ACT nasal spray USE TWO SPRAYS IN EACH NOSTRIL ONCE DAILY (Patient taking differently: as needed. USE TWO SPRAYS IN EACH NOSTRIL ONCE DAILY) 16 g 5  . gabapentin (NEURONTIN) 100 MG capsule Take 1 capsule (100 mg total) by mouth at bedtime. 30 capsule 2  . loperamide (IMODIUM) 2 MG capsule Take by mouth as needed for diarrhea or loose stools.    . metoprolol succinate (TOPROL-XL) 50 MG 24 hr tablet Take 1 and 1/2 Tablets At Night 135 tablet 3  . nitrofurantoin (MACRODANTIN) 100 MG capsule nitrofurantoin macrocrystal 100 mg capsule    . nitroGLYCERIN (NITROSTAT) 0.4 MG SL tablet Place 1 tablet (0.4 mg total) under the tongue every 5 (five) minutes as  needed for chest pain. 30 tablet 0  . ofloxacin (OCUFLOX) 0.3 % ophthalmic solution ofloxacin 0.3 % eye drops  INSTILL 1 DROP INTO AFFECTED EYE(S) BY OPHTHALMIC ROUTE 4 TIMES PER DAY    . pantoprazole (PROTONIX) 40 MG tablet TAKE 1 TABLET EVERY MORNING 90 tablet 1  . predniSONE (DELTASONE) 1 MG tablet TAKE 3 TABLETS BY MOUTH DAILY WITH BREAKFAST. TAKE ALONG WITH 5 MG TABLET AND TAPER DOSE BY 1 MG(1 TABLET) EVERY MONTH 90 tablet 0  . predniSONE (DELTASONE) 10 MG tablet Take 1 tablet (10 mg total) by mouth daily with breakfast. 30 tablet 0  . predniSONE (DELTASONE) 5 MG tablet TAKE 1 TABLET BY MOUTH DAILY WITH BREAKFAST(TAPER BY 1 MG EVERY MONTH) 30 tablet 0  . Rivaroxaban (XARELTO) 15 MG TABS tablet TAKE ONE TABLET (15MG  TOTAL) BY MOUTH DAILY WITH SUPPER 90 tablet 1  . Rivaroxaban (XARELTO) 15 MG TABS tablet Xarelto 15 mg tablet    . tiZANidine (ZANAFLEX) 4 MG capsule Take 1 capsule (4 mg total) by mouth 3 (three) times daily. 30 capsule 0  . TROSPIUM CHLORIDE PO Take by mouth.    . Zinc 50 MG CAPS Take 50 mg by mouth daily.    Marland Kitchen zinc sulfate 220 (50 Zn) MG capsule zinc sulfate 220 mg (50 mg) capsule  Take by oral route.     No current facility-administered medications for this visit.      Past Surgical History:  Procedure Laterality Date  . ABDOMINAL HYSTERECTOMY  1977   partial  . BACK SURGERY  1982  . CARDIAC CATHETERIZATION    . COLONOSCOPY N/A 01/27/2013   Procedure: COLONOSCOPY;  Surgeon: Rogene Houston, MD;  [facility];  Service: Endoscopy;  Laterality: N/A;  1030  . EYE SURGERY Bilateral 04/2017  . JOINT REPLACEMENT  2001 and  2011   6 knee surgeries   bil replacement  . KNEE ARTHROSCOPY    . LUMBAR LAMINECTOMY    . PR VEIN BYPASS GRAFT,AORTO-FEM-POP  2010  . removal of melanoma from back  2007  . SPINE SURGERY  2013   Broken back: fusion surgery  . tonisllectomy  1944  . total knee anthroplasty    . VERTEBROPLASTY  07/11/2011   Procedure: VERTEBROPLASTY;   Surgeon: Faythe Ghee, MD;  Location: [place] NEURO ORS;  Service: Neurosurgery;  Laterality: N/A;  Vertebroplasty of Lumbar Two     Allergies  Allergen Reactions  . Epinephrine Other (See Comments)    During dental procedure-caused facial swelling  at injection site. Pt has had epinephrine since then for nose and facial surgery and did fine with it per pt.  . Clopidogrel Bisulfate Itching and Rash    Plavix  . Oxycodone     hallucinations      Family History  Problem Relation Age of Onset  . Heart disease Father   . Hypertension Father   . Heart attack Father   . Cancer Mother        bladder   . Hypertension Daughter   . Atrial fibrillation Daughter   . Hypertension Son   . Colon cancer Neg Hx      Social History Brenda Hall reports that she has never smoked. She has never used smokeless tobacco. Ms. Aponte reports that she drinks alcohol.   Review of Systems CONSTITUTIONAL: No weight loss, fever, chills, weakness or fatigue.  HEENT: Eyes: No visual loss, blurred vision, double vision or yellow sclerae.No hearing loss, sneezing, congestion, runny nose or sore throat.  SKIN: No rash or itching.  CARDIOVASCULAR: per hpi RESPIRATORY: per hpi GASTROINTESTINAL: No anorexia, nausea, vomiting or diarrhea. No abdominal pain or blood.  GENITOURINARY: No burning on urination, no polyuria NEUROLOGICAL: No headache, dizziness, syncope, paralysis, ataxia, numbness or tingling in the extremities. No change in bowel or bladder control.  MUSCULOSKELETAL: No muscle, back pain, joint pain or stiffness.  LYMPHATICS: No enlarged nodes. No history of splenectomy.  PSYCHIATRIC: No history of depression or anxiety.  ENDOCRINOLOGIC: No reports of sweating, cold or heat intolerance. No polyuria or polydipsia.  Marland Kitchen   Physical Examination Vitals:   09/02/17 1410  BP: 132/72  Pulse: 78  SpO2: 92%   Vitals:   09/02/17 1410  Weight: 158 lb (71.7 kg)  Height: 5\' 6"  (1.676 m)     Gen: resting comfortably, no acute distress HEENT: no scleral icterus, pupils equal round and reactive, no palptable cervical adenopathy,  CV: RRR, no m/r/g, no jvd Resp: Clear to auscultation bilaterally GI: abdomen is soft, non-tender, non-distended, normal bowel sounds, no hepatosplenomegaly MSK: extremities are warm, no edema.  Skin: warm, no rash Neuro:  no focal deficits Psych: appropriate affect   Diagnostic Studies 02/2013 Carotid US <40% bilateral ICA disease  03/2008 Echo SUMMARY - Overall left ventricular systolic function was normal. There were no left ventricular regional wall motion abnormalities. Left ventricular wall thickness was mildly increased. - There was trivial aortic valvular regurgitation. - The effective orifice of mitral regurgitation by proximal isovelocity surface area was 0.06 cm^2. The volume of mitral regurgitation by proximal isovelocity surface area was 9 cc. - The left atrium was mildly dilated. - The estimated peak right ventricular systolic pressure was within the upper limits of normal. - The right atrium was mildly dilated.     Assessment and Plan  1. CAD -no symptoms, conitnue current meds  2. Hyperlipidemia -continue statin.   3. HTN - ACE-I stopped recently due to high potassium. May consider trial of low dose at some point, she had been on 10mg  daily previously - at goal, continue current meds  4. Afib - no symptoms, continue current meds  5. LE edema - likely due to recent steroid use - start lasix 30mg  prn.         Arnoldo Lenis, M.D.

## 2017-09-02 NOTE — Patient Instructions (Signed)
Medication Instructions:  START LASIX 20 MG DAILY AS NEEDED FOR SWELLING   Labwork: NONE  Testing/Procedures: NONE  Follow-Up: Your physician wants you to follow-up in: 6 MONTHS.  You will receive a reminder letter in the mail two months in advance. If you don't receive a letter, please call our office to schedule the follow-up appointment.   Any Other Special Instructions Will Be Listed Below (If Applicable).     If you need a refill on your cardiac medications before your next appointment, please call your pharmacy.   

## 2017-09-03 ENCOUNTER — Other Ambulatory Visit (HOSPITAL_COMMUNITY): Payer: Self-pay | Admitting: Interventional Radiology

## 2017-09-03 ENCOUNTER — Ambulatory Visit (HOSPITAL_COMMUNITY)
Admission: RE | Admit: 2017-09-03 | Discharge: 2017-09-03 | Disposition: A | Discharge status: Home / Self Care | Payer: Medicare Other | Source: Ambulatory Visit | Attending: Interventional Radiology | Admitting: Interventional Radiology

## 2017-09-03 DIAGNOSIS — S22080A Wedge compression fracture of T11-T12 vertebra, initial encounter for closed fracture: Secondary | ICD-10-CM | POA: Diagnosis not present

## 2017-09-03 NOTE — Consult Note (Signed)
Chief Complaint: Patient was seen in consultation today for thoracic 12 compression fracture.  Referring Physician(s): Jessy Oto  Supervising Physician: Luanne Bras  Patient Status: [facility] - Out-pt  History of Present Illness: Brenda Hall is a 84 y.o. female with a past medical history of hypertension, CAD, MI 2007, afib, carotid stenosis, hyperlipidemia, TIA, CVA, severe osteoporosis, osteoarthritis, polymyalgia rheumatica, and lumbar 2 compression fracture s/p vertebroplasty 06/2011 by Dr. Hal Neer.  Patient has a 2-3 month history of lower back pain without known injury. States pain is worse when standing and laying flat. Reports she has been sleeping in a recliner, as she cannot lay flat to sleep because of the pain. Rates pain 10/10 when it is at its worse. She uses Tylenol OTC which provider little pain relief. She had a lumbar 3-lumbar 4 epidural steroid injection 07/21/2017 with Dr. Ernestina Patches which she states has improved her pain, but she still cannot lay flat.  IR requested by Dr. Louanne Skye for possible image-guided kyphoplasty/vertebroplasty for management of thoracic 12 compression fracture. Patient states her current pain level is 0/10, as the steroid injection has improved her pain and she is not laying flat, which is when her pain is the worst. Denies numbness/tingling down legs, bowel incontinence, or UTI symptoms including fever, dysuria, and urinary frequency. States she has had bladder incontinence for years, which is stable at this time.   Patient is taking Xeralto 15 mg once daily for afib. State she last took Xeralto yesterday.  Past Medical History:  Diagnosis Date  . Anemia   . CAD in native artery    a. NSTEMI 12/2005 s/p BMS to Cx, prior normal LVEF.  Marland Kitchen Cancer ([place]) 1988 and  2009   BCC nose and  Melanoma back  . Cancer of skin Jan. 2014   [facility]  . Carotid stenosis    mild  . Chronic back pain   . CKD (chronic kidney disease), stage  III ([place])   . Coronary atherosclerosis of unspecified type of vessel, native or graft   . DJD (degenerative joint disease) of knee   . History of TIA (transient ischemic attack)   . HTN (hypertension)   . IBS (irritable bowel syndrome)   . Myocardial infarction [facility])    5 yrs ago     dr wall  cardiac  . Osteoporosis   . Other diseases of lung, not elsewhere classified    lung nodule  . Polymyalgia rheumatica ([place])   . Shingles 01/2016-02/2016  . Stroke [facility])     Past Surgical History:  Procedure Laterality Date  . ABDOMINAL HYSTERECTOMY  1977   partial  . BACK SURGERY  1982  . CARDIAC CATHETERIZATION    . COLONOSCOPY N/A 01/27/2013   Procedure: COLONOSCOPY;  Surgeon: Rogene Houston, MD;  Location: [facility] ENDO SUITE;  Service: Endoscopy;  Laterality: N/A;  1030  . EYE SURGERY Bilateral 04/2017  . JOINT REPLACEMENT  2001 and  2011   6 knee surgeries   bil replacement  . KNEE ARTHROSCOPY    . LUMBAR LAMINECTOMY    . PR VEIN BYPASS GRAFT,AORTO-FEM-POP  2010  . removal of melanoma from back  2007  . SPINE SURGERY  2013   Broken back: fusion surgery  . tonisllectomy  1944  . total knee anthroplasty    . VERTEBROPLASTY  07/11/2011   Procedure: VERTEBROPLASTY;  Surgeon: Faythe Ghee, MD;  Location: [place] NEURO ORS;  Service: Neurosurgery;  Laterality: N/A;  Vertebroplasty of  Lumbar Two    Allergies: Epinephrine; Clopidogrel bisulfate; and Oxycodone  Medications: Prior to Admission medications   Medication Sig Start Date End Date Taking? Authorizing Provider  amLODipine (NORVASC) 5 MG tablet Take 1 tablet (5 mg total) by mouth daily. 06/17/17 09/15/17  Arnoldo Lenis, MD  aspirin 81 MG tablet Take 81 mg by mouth daily.    [provider]  atorvastatin (LIPITOR) 80 MG tablet Take 1 tablet (80 mg total) by mouth at bedtime. 05/29/17   [name], MD  atorvastatin (LIPITOR) 80 MG tablet atorvastatin 80 mg tablet  Take 1 tablet every day by oral route.    [provider]  Calcium Carbonate-Vit D-Min (CALCIUM 1200 PO) Take 1 tablet by mouth at bedtime.     [provider]  estrogens, conjugated, (PREMARIN) 0.3 MG tablet Take 1 tablet (0.3 mg total) by mouth every evening. 02/14/17   Johnson, Clanford L, MD  estrogens, conjugated, (PREMARIN) 0.3 MG tablet conjugated estrogens 0.3 mg tablet  Take 1 tablet every day by oral route.    [provider]  fluticasone (FLONASE) 50 MCG/ACT nasal spray USE TWO SPRAYS IN EACH NOSTRIL ONCE DAILY Patient taking differently: as needed. USE TWO SPRAYS IN EACH NOSTRIL ONCE DAILY 12/03/16   [name], MD  furosemide (LASIX) 20 MG tablet Take 20 mg DAILY AS NEEDED FOR SWELLING. 09/02/17   Arnoldo Lenis, MD  gabapentin (NEURONTIN) 100 MG capsule Take 1 capsule (100 mg total) by mouth at bedtime. 08/27/17   Jessy Oto, MD  loperamide (IMODIUM) 2 MG capsule Take by mouth as needed for diarrhea or loose stools.    [provider]  metoprolol succinate (TOPROL-XL) 50 MG 24 hr tablet Take 1 and 1/2 Tablets At Night 04/17/17   Lendon Colonel, NP  nitrofurantoin (MACRODANTIN) 100 MG capsule nitrofurantoin macrocrystal 100 mg capsule    [provider]  nitroGLYCERIN (NITROSTAT) 0.4 MG SL tablet Place 1 tablet (0.4 mg total) under the tongue every 5 (five) minutes as needed for chest pain. 12/03/16   [name], MD  ofloxacin (OCUFLOX) 0.3 % ophthalmic solution ofloxacin 0.3 % eye drops  INSTILL 1 DROP INTO AFFECTED EYE(S) BY OPHTHALMIC ROUTE 4 TIMES PER DAY    [provider]  pantoprazole (PROTONIX) 40 MG tablet TAKE 1 TABLET EVERY MORNING 04/21/17   Bo Merino, MD  predniSONE (DELTASONE) 1 MG tablet TAKE 3 TABLETS BY MOUTH DAILY WITH BREAKFAST. TAKE ALONG WITH 5 MG TABLET AND TAPER DOSE BY 1 MG(1 TABLET) EVERY MONTH 08/19/17   Bo Merino, MD  predniSONE (DELTASONE) 10 MG tablet Take 1 tablet (10 mg total) by mouth daily with breakfast. 05/28/17    Ofilia Neas, PA-C  predniSONE (DELTASONE) 5 MG tablet TAKE 1 TABLET BY MOUTH DAILY WITH BREAKFAST(TAPER BY 1 MG EVERY MONTH) 08/19/17   Bo Merino, MD  Rivaroxaban (XARELTO) 15 MG TABS tablet TAKE ONE TABLET (15MG  TOTAL) BY MOUTH DAILY WITH SUPPER 04/18/17   Arnoldo Lenis, MD  Rivaroxaban (XARELTO) 15 MG TABS tablet Xarelto 15 mg tablet    [provider]  tiZANidine (ZANAFLEX) 4 MG capsule Take 1 capsule (4 mg total) by mouth 3 (three) times daily. 08/05/17   Magnus Sinning, MD  TROSPIUM CHLORIDE PO Take by mouth.    [provider]  Zinc 50 MG CAPS Take 50 mg by mouth daily.    [provider]  zinc sulfate 220 (50 Zn) MG capsule zinc sulfate  220 mg (50 mg) capsule  Take by oral route.    [provider]     Family History  Problem Relation Age of Onset  . Heart disease Father   . Hypertension Father   . Heart attack Father   . Cancer Mother        bladder   . Hypertension Daughter   . Atrial fibrillation Daughter   . Hypertension Son   . Colon cancer Neg Hx     Social History   Socioeconomic History  . Marital status: Widowed    Spouse name: Not on file  . Number of children: Not on file  . Years of education: Not on file  . Highest education level: Not on file  Occupational History  . Not on file  Social Needs  . Financial resource strain: Not on file  . Food insecurity:    Worry: Not on file    Inability: Not on file  . Transportation needs:    Medical: Not on file    Non-medical: Not on file  Tobacco Use  . Smoking status: Never Smoker  . Smokeless tobacco: Never Used  . Tobacco comment: tobacco use - no  Substance and Sexual Activity  . Alcohol use: Yes    Alcohol/week: 0.0 oz    Comment: occasionally a glass of wine or margarita per pt  . Drug use: No  . Sexual activity: Not on file  Lifestyle  . Physical activity:    Days per week: Not on file    Minutes per session: Not on file  . Stress: Not on file    Relationships  . Social connections:    Talks on phone: Not on file    Gets together: Not on file    Attends religious service: Not on file    Active member of club or organization: Not on file    Attends meetings of clubs or organizations: Not on file    Relationship status: Not on file  Other Topics Concern  . Not on file  Social History Narrative   Married, retired. 3 children. Regular exercise -yes; hobbies = swimming.     Review of Systems: A 12 point ROS discussed and pertinent positives are indicated in the HPI above.  All other systems are negative.  Review of Systems  Constitutional: Negative for fever.  Respiratory: Negative for shortness of breath and wheezing.   Cardiovascular: Negative for chest pain and palpitations.  Gastrointestinal:       Negative for bowel incontinence.  Genitourinary: Negative for dysuria and frequency.       Positive for bladder incontinence.  Musculoskeletal: Positive for back pain.  Neurological: Negative for numbness.  Psychiatric/Behavioral: Negative for behavioral problems and confusion.    Vital Signs: There were no vitals taken for this visit.  Physical Exam  Constitutional: She is oriented to person, place, and time. She appears well-developed and well-nourished. No distress.  Pulmonary/Chest: Effort normal. No respiratory distress.  Musculoskeletal:  Mild tenderness of lower midline spine.  Neurological: She is alert and oriented to person, place, and time.  Skin: Skin is warm and dry.  Psychiatric: She has a normal mood and affect. Her behavior is normal. Judgment and thought content normal.  Nursing note and vitals reviewed.    Imaging: Mr Lumbar Spine Wo Contrast  Result Date: 08/21/2017 CLINICAL DATA:  Lumbar radiculopathy.  Back pain for over 6 weeks EXAM: MRI LUMBAR SPINE WITHOUT CONTRAST TECHNIQUE: Multiplanar, multisequence MR imaging of the lumbar  spine was performed. No intravenous contrast was administered.  COMPARISON:  Lumbar spine CT 01/02/2016 FINDINGS: Segmentation:  5 lumbar type vertebral bodies based on prior. Alignment: Reversal of lumbar lordosis with mild anterolisthesis at L2-3 and L3-4. Mild retrolisthesis at L5-S1. levoscoliosis. Vertebrae: T12 body fracture with horizontal fracture plane following the inferior endplate and containing fluid. There is diffuse marrow edema. No paravertebral mass is seen. No reported history of metastatic disease. Height loss is mild. Remote L2 and L3 body fractures.  L2 has undergone vertebroplasty. Conus medullaris and cauda equina: Conus extends to the L1-2 level. Conus and cauda equina appear normal. Paraspinal and other soft tissues: Fatty atrophy of intrinsic back muscles, pronounced. Disc levels: T12- L1: Posterior element hypertrophy and mild fracture retropulsion causes CSF effacement without cord compression, moderate narrowing. Noncompressive bilateral foraminal narrowing. L1-L2: Facet arthropathy with asymmetric hypertrophy on the right. Ligamentum flavum thickening. The disc is narrowed and bulging. Moderate spinal stenosis with crowding of the cauda equina. Patent foramina. L2-L3: Posttraumatic disc distortion and posterior element hypertrophy. AP thecal sac narrowing with overall mild spinal stenosis. Patent bilateral foramina L3-L4: Facet arthropathy with hypertrophy and joint distortion. Disc bulging and remote mild retropulsion. High-grade spinal stenosis. Patent foramina L4-L5: Interbody fusion.  Patent canal and foramina L5-S1:Advanced disc degeneration. Posterior element hypertrophy. There are changes of prior right-sided laminotomy. Right foraminal narrowing appears advanced on sagittal acquisition and mild on axial slices. Patent canal. These results will be called to the ordering clinician or representative by the Radiologist Assistant, and communication documented in the PACS or zVision Dashboard. IMPRESSION: 1. Recent/unhealed T12 body fracture with  marrow edema and fluid-filled fracture cleft. Height loss and retropulsion is mild. 2. Remote L2 and L3 compression fractures. 3. Advanced disc and facet degeneration with kyphoscoliosis. 4. Spinal stenosis is moderate at T12-L1/L1-2 and advanced at L3-4. 5. L4-5 solid bony fusion. Electronically Signed   By: Monte Fantasia M.D.   On: 08/21/2017 14:10    Labs:  CBC: Recent Labs    02/12/17 2014 02/13/17 0744 02/14/17 0510  WBC 7.2 5.6 6.7  HGB 12.1 12.0 11.9*  HCT 37.5 36.9 37.5  PLT 327 295 320    COAGS: No results for input(s): INR, APTT in the last 8760 hours.  BMP: Recent Labs    01/14/17 1031 02/12/17 2014 02/13/17 0744 02/14/17 0510  NA 144 143 139 139  K 4.5 3.6 3.9 4.2  CL 105 106 101 103  CO2 25 25 30 27   GLUCOSE 95 133* 98 97  BUN 19 23* 25* 28*  CALCIUM 9.5 9.2 9.0 8.6*  CREATININE 1.32* 1.44* 1.49* 1.55*  GFRNONAA 37* 32* 31* 30*  GFRAA 43* 37* 36* 34*    LIVER FUNCTION TESTS: Recent Labs    12/04/16 0939 05/31/17 0858  BILITOT 0.5 0.4  AST 28 14  ALT 17 11  ALKPHOS 56 67  PROT 7.3 7.1  ALBUMIN 4.1 3.8    TUMOR MARKERS: No results for input(s): AFPTM, CEA, CA199, CHROMGRNA in the last 8760 hours.  Assessment and Plan:  Thoracic 12 compression fracture. Reviewed imaging with patient. Explained that the best course of action to manage her pain from her thoracic 12 compression fracture is with a procedure called a kyphoplasty/vertebroplasty. Explained procedure to patient, including risks and benefits.  Plan for image-guided thoracic 12 kyphoplasty/vertebroplasty with Dr. Estanislado Pandy ASAP. Informed patient that our schedulers will call her to set up this procedure. Instructed patient to continue using Tylenol OTC for pain relief. Patient is currently  taking Xeralto for afib. Informed patient that she will need to be off of Xeralto for 48 hours prior to procedure.  All questions answered and concerns addressed. Patient conveys understanding  and agrees with plan.  Thank you for this interesting consult.  I greatly enjoyed meeting Brenda Hall and look forward to participating in their care.  A copy of this report was sent to the requesting provider on this date.  Electronically Signed: Earley Abide, PA-C 09/03/2017, 12:57 PM   I spent a total of 30 Minutes in face to face in clinical consultation, greater than 50% of which was counseling/coordinating care for thoracic 12 compression fracture.

## 2017-09-04 ENCOUNTER — Other Ambulatory Visit: Payer: Self-pay | Admitting: Radiology

## 2017-09-05 ENCOUNTER — Encounter (HOSPITAL_COMMUNITY): Payer: Self-pay

## 2017-09-05 ENCOUNTER — Ambulatory Visit (HOSPITAL_COMMUNITY)
Admission: RE | Admit: 2017-09-05 | Discharge: 2017-09-05 | Disposition: A | Discharge status: Home / Self Care | Payer: Medicare Other | Source: Ambulatory Visit | Attending: Interventional Radiology | Admitting: Interventional Radiology

## 2017-09-05 DIAGNOSIS — S22080A Wedge compression fracture of T11-T12 vertebra, initial encounter for closed fracture: Secondary | ICD-10-CM

## 2017-09-05 DIAGNOSIS — Z7951 Long term (current) use of inhaled steroids: Secondary | ICD-10-CM | POA: Insufficient documentation

## 2017-09-05 DIAGNOSIS — M353 Polymyalgia rheumatica: Secondary | ICD-10-CM | POA: Diagnosis not present

## 2017-09-05 DIAGNOSIS — M1990 Unspecified osteoarthritis, unspecified site: Secondary | ICD-10-CM | POA: Insufficient documentation

## 2017-09-05 DIAGNOSIS — Z8673 Personal history of transient ischemic attack (TIA), and cerebral infarction without residual deficits: Secondary | ICD-10-CM | POA: Diagnosis not present

## 2017-09-05 DIAGNOSIS — I4891 Unspecified atrial fibrillation: Secondary | ICD-10-CM | POA: Insufficient documentation

## 2017-09-05 DIAGNOSIS — G8929 Other chronic pain: Secondary | ICD-10-CM | POA: Insufficient documentation

## 2017-09-05 DIAGNOSIS — Z8249 Family history of ischemic heart disease and other diseases of the circulatory system: Secondary | ICD-10-CM | POA: Insufficient documentation

## 2017-09-05 DIAGNOSIS — Z7901 Long term (current) use of anticoagulants: Secondary | ICD-10-CM | POA: Insufficient documentation

## 2017-09-05 DIAGNOSIS — I252 Old myocardial infarction: Secondary | ICD-10-CM | POA: Diagnosis not present

## 2017-09-05 DIAGNOSIS — M810 Age-related osteoporosis without current pathological fracture: Secondary | ICD-10-CM | POA: Diagnosis not present

## 2017-09-05 DIAGNOSIS — Z7982 Long term (current) use of aspirin: Secondary | ICD-10-CM | POA: Diagnosis not present

## 2017-09-05 DIAGNOSIS — I2510 Atherosclerotic heart disease of native coronary artery without angina pectoris: Secondary | ICD-10-CM | POA: Insufficient documentation

## 2017-09-05 DIAGNOSIS — I129 Hypertensive chronic kidney disease with stage 1 through stage 4 chronic kidney disease, or unspecified chronic kidney disease: Secondary | ICD-10-CM | POA: Insufficient documentation

## 2017-09-05 DIAGNOSIS — M5416 Radiculopathy, lumbar region: Secondary | ICD-10-CM | POA: Insufficient documentation

## 2017-09-05 DIAGNOSIS — K589 Irritable bowel syndrome without diarrhea: Secondary | ICD-10-CM | POA: Diagnosis not present

## 2017-09-05 DIAGNOSIS — M4854XA Collapsed vertebra, not elsewhere classified, thoracic region, initial encounter for fracture: Secondary | ICD-10-CM | POA: Diagnosis not present

## 2017-09-05 DIAGNOSIS — N183 Chronic kidney disease, stage 3 (moderate): Secondary | ICD-10-CM | POA: Insufficient documentation

## 2017-09-05 DIAGNOSIS — E785 Hyperlipidemia, unspecified: Secondary | ICD-10-CM | POA: Insufficient documentation

## 2017-09-05 DIAGNOSIS — Z885 Allergy status to narcotic agent status: Secondary | ICD-10-CM | POA: Diagnosis not present

## 2017-09-05 HISTORY — PX: IR VERTEBROPLASTY CERV/THOR BX INC UNI/BIL INC/INJECT/IMAGING: IMG5515

## 2017-09-05 LAB — BASIC METABOLIC PANEL
ANION GAP: 9 (ref 5–15)
BUN: 22 mg/dL — ABNORMAL HIGH (ref 6–20)
CO2: 28 mmol/L (ref 22–32)
Calcium: 9.3 mg/dL (ref 8.9–10.3)
Chloride: 104 mmol/L (ref 101–111)
Creatinine, Ser: 1.54 mg/dL — ABNORMAL HIGH (ref 0.44–1.00)
GFR, EST AFRICAN AMERICAN: 35 mL/min — AB (ref >60)
GFR, EST NON AFRICAN AMERICAN: 30 mL/min — AB (ref >60)
GLUCOSE: 100 mg/dL — AB (ref 65–99)
POTASSIUM: 4.4 mmol/L (ref 3.5–5.1)
Sodium: 141 mmol/L (ref 135–145)

## 2017-09-05 LAB — CBC
HEMATOCRIT: 39.9 % (ref 36.0–46.0)
HEMOGLOBIN: 12.4 g/dL (ref 12.0–15.0)
MCH: 29 pg (ref 26.0–34.0)
MCHC: 31.1 g/dL (ref 30.0–36.0)
MCV: 93.4 fL (ref 78.0–100.0)
Platelets: 423 10*3/uL — ABNORMAL HIGH (ref 150–400)
RBC: 4.27 MIL/uL (ref 3.87–5.11)
RDW: 14.6 % (ref 11.5–15.5)
WBC: 9.5 10*3/uL (ref 4.0–10.5)

## 2017-09-05 LAB — PROTIME-INR
INR: 0.98
PROTHROMBIN TIME: 12.9 s (ref 11.4–15.2)

## 2017-09-05 MED ORDER — CEFAZOLIN SODIUM-DEXTROSE 2-4 GM/100ML-% IV SOLN
2.0000 g | INTRAVENOUS | Status: AC
  Administered 2017-09-05: 2 g via INTRAVENOUS

## 2017-09-05 MED ORDER — CEFAZOLIN SODIUM-DEXTROSE 2-4 GM/100ML-% IV SOLN
INTRAVENOUS | Status: AC
  Filled 2017-09-05: qty 100

## 2017-09-05 MED ORDER — SODIUM CHLORIDE 0.9 % IV SOLN
INTRAVENOUS | Status: DC
  Administered 2017-09-05: 12:00:00 via INTRAVENOUS

## 2017-09-05 MED ORDER — MIDAZOLAM HCL 2 MG/2ML IJ SOLN
INTRAMUSCULAR | Status: AC
  Filled 2017-09-05: qty 4

## 2017-09-05 MED ORDER — TOBRAMYCIN SULFATE 1.2 G IJ SOLR
INTRAMUSCULAR | Status: DC
Start: 2017-09-05 — End: 2017-09-06
  Filled 2017-09-05: qty 1.2

## 2017-09-05 MED ORDER — GELATIN ABSORBABLE 12-7 MM EX MISC
CUTANEOUS | Status: AC
  Filled 2017-09-05: qty 1

## 2017-09-05 MED ORDER — FENTANYL CITRATE (PF) 100 MCG/2ML IJ SOLN
INTRAMUSCULAR | Status: AC | PRN
  Administered 2017-09-05 (×3): 25 ug via INTRAVENOUS

## 2017-09-05 MED ORDER — TOBRAMYCIN SULFATE 1.2 G IJ SOLR
INTRAMUSCULAR | Status: AC | PRN
  Administered 2017-09-05: .01 g via TOPICAL

## 2017-09-05 MED ORDER — FENTANYL CITRATE (PF) 100 MCG/2ML IJ SOLN
INTRAMUSCULAR | Status: AC
  Filled 2017-09-05: qty 4

## 2017-09-05 MED ORDER — BUPIVACAINE HCL (PF) 0.5 % IJ SOLN
INTRAMUSCULAR | Status: AC
  Filled 2017-09-05: qty 30

## 2017-09-05 MED ORDER — MIDAZOLAM HCL 2 MG/2ML IJ SOLN
INTRAMUSCULAR | Status: AC | PRN
  Administered 2017-09-05 (×2): 1 mg via INTRAVENOUS

## 2017-09-05 MED ORDER — SODIUM CHLORIDE 0.9 % IV SOLN: INTRAVENOUS | Status: AC

## 2017-09-05 MED ORDER — BUPIVACAINE HCL (PF) 0.5 % IJ SOLN
INTRAMUSCULAR | Status: AC | PRN
  Administered 2017-09-05: 15 mL

## 2017-09-05 MED ORDER — HYDROMORPHONE HCL 2 MG/ML IJ SOLN
INTRAMUSCULAR | Status: AC
  Filled 2017-09-05: qty 1

## 2017-09-05 MED ORDER — HYDRALAZINE HCL 20 MG/ML IJ SOLN
INTRAMUSCULAR | Status: AC
  Filled 2017-09-05: qty 1

## 2017-09-05 MED ORDER — IOPAMIDOL (ISOVUE-300) INJECTION 61%
INTRAVENOUS | Status: AC
  Administered 2017-09-05: 3 mL
  Filled 2017-09-05: qty 50

## 2017-09-05 NOTE — H&P (Signed)
Chief Complaint: Patient was seen in consultation today for thoracic 12 compression fracture.   Supervising Physician: Luanne Bras  Patient Status: [facility] - Out-pt  History of Present Illness: Brenda Hall is a 84 y.o. female with a past medical history of hypertension, CAD, MI 2007, afib, carotid stenosis, hyperlipidemia, TIA, CVA, severe osteoporosis, osteoarthritis, polymyalgia rheumatica, and lumbar 2 compression fracture s/p vertebroplasty 06/2011 by Dr. Hal Neer. She was seen by Dr. Estanislado Pandy 09/03/2017 in consultation for thoracic 12 compression fracture.  Patient presents today for image-guided thoracic 12 kyphoplasty/vertebroplasty. Accompanied by son at bedside. Complains of lower back pain, rated 3/10 now because she is laying in bed. States back pain is worse with movement, and rates pain 10/10 when it is at its worse. Denies numbness/tingling down legs, bowel incontinence, or UTI symptoms including fever, dysuria, and urinary frequency. States she has had bladder incontinence for years, which is stable at this time.   Patient is taking Xarelto 15 mg once daily for afib. State her last Xarelto dose was 09/01/2017.  Past Medical History:  Diagnosis Date  . Anemia   . CAD in native artery    a. NSTEMI 12/2005 s/p BMS to Cx, prior normal LVEF.  Marland Kitchen Cancer ([place]) 1988 and  2009   BCC nose and  Melanoma back  . Cancer of skin Jan. 2014   [facility] Forehead and Nose  . Carotid stenosis    mild  . Chronic back pain   . CKD (chronic kidney disease), stage III ([place])   . Coronary atherosclerosis of unspecified type of vessel, native or graft   . DJD (degenerative joint disease) of knee   . History of TIA (transient ischemic attack)   . HTN (hypertension)   . IBS (irritable bowel syndrome)   . Myocardial infarction [facility])    5 yrs ago     dr wall  cardiac  . Osteoporosis   . Other diseases of lung, not elsewhere classified    lung nodule  . Polymyalgia rheumatica ([place])   .  Shingles 01/2016-02/2016  . Stroke [facility])     Past Surgical History:  Procedure Laterality Date  . ABDOMINAL HYSTERECTOMY  1977   partial  . BACK SURGERY  1982  . CARDIAC CATHETERIZATION    . COLONOSCOPY N/A 01/27/2013   Procedure: COLONOSCOPY;  Surgeon: Rogene Houston, MD;  Location: [facility] ENDO SUITE;  Service: Endoscopy;  Laterality: N/A;  1030  . EYE SURGERY Bilateral 04/2017  . JOINT REPLACEMENT  2001 and  2011   6 knee surgeries   bil replacement  . KNEE ARTHROSCOPY    . LUMBAR LAMINECTOMY    . PR VEIN BYPASS GRAFT,AORTO-FEM-POP  2010  . removal of melanoma from back  2007  . SPINE SURGERY  2013   Broken back: fusion surgery  . tonisllectomy  1944  . total knee anthroplasty    . VERTEBROPLASTY  07/11/2011   Procedure: VERTEBROPLASTY;  Surgeon: Faythe Ghee, MD;  Location: [place] NEURO ORS;  Service: Neurosurgery;  Laterality: N/A;  Vertebroplasty of Lumbar Two    Allergies: Epinephrine; Clopidogrel bisulfate; and Oxycodone  Medications: Prior to Admission medications   Medication Sig Start Date End Date Taking? Authorizing Provider  amLODipine (NORVASC) 5 MG tablet Take 1 tablet (5 mg total) by mouth daily. 06/17/17 09/15/17 Yes BranchAlphonse Guild, MD  aspirin 81 MG tablet Take 81 mg by mouth daily.   Yes [provider]  atorvastatin (LIPITOR) 80 MG tablet Take 1 tablet (80 mg  total) by mouth at bedtime. 05/29/17  Yes [name], MD  Calcium Carbonate-Vit D-Min (CALCIUM 1200 PO) Take 1 tablet by mouth at bedtime.    Yes [provider]  estrogens, conjugated, (PREMARIN) 0.3 MG tablet Take 1 tablet (0.3 mg total) by mouth every evening. 02/14/17  Yes Johnson, Clanford L, MD  fluticasone (FLONASE) 50 MCG/ACT nasal spray USE TWO SPRAYS IN EACH NOSTRIL ONCE DAILY Patient taking differently: Place 1 spray into both nostrils daily as needed for allergies. USE TWO SPRAYS IN EACH NOSTRIL ONCE DAILY 12/03/16  Yes [name], MD  furosemide (LASIX) 20 MG  tablet Take 20 mg DAILY AS NEEDED FOR SWELLING. Patient taking differently: Take 20 mg by mouth daily as needed for fluid. Take 20 mg DAILY AS NEEDED FOR SWELLING. 09/02/17  Yes BranchAlphonse Guild, MD  loperamide (IMODIUM) 2 MG capsule Take 2 mg by mouth as needed for diarrhea or loose stools.    Yes [provider]  metoprolol succinate (TOPROL-XL) 50 MG 24 hr tablet Take 1 and 1/2 Tablets At Night Patient taking differently: Take 75 mg by mouth every evening. Take 1 and 1/2 Tablets At Night 04/17/17  Yes Lendon Colonel, NP  nitroGLYCERIN (NITROSTAT) 0.4 MG SL tablet Place 1 tablet (0.4 mg total) under the tongue every 5 (five) minutes as needed for chest pain. 12/03/16  Yes [name], MD  pantoprazole (PROTONIX) 40 MG tablet TAKE 1 TABLET EVERY MORNING 04/21/17  Yes Deveshwar, Abel [place], MD  predniSONE (DELTASONE) 1 MG tablet Take 7 mg by mouth daily with breakfast.   Yes [provider]  Zinc 50 MG CAPS Take 50 mg by mouth daily.   Yes [provider]  gabapentin (NEURONTIN) 100 MG capsule Take 1 capsule (100 mg total) by mouth at bedtime. 08/27/17   Jessy Oto, MD  Rivaroxaban (XARELTO) 15 MG TABS tablet TAKE ONE TABLET (15MG  TOTAL) BY MOUTH DAILY WITH SUPPER 04/18/17   Arnoldo Lenis, MD     Family History  Problem Relation Age of Onset  . Heart disease Father   . Hypertension Father   . Heart attack Father   . Cancer Mother        bladder   . Hypertension Daughter   . Atrial fibrillation Daughter   . Hypertension Son   . Colon cancer Neg Hx     Social History   Socioeconomic History  . Marital status: Widowed    Spouse name: Not on file  . Number of children: Not on file  . Years of education: Not on file  . Highest education level: Not on file  Occupational History  . Not on file  Social Needs  . Financial resource strain: Not on file  . Food insecurity:    Worry: Not on file    Inability: Not on file  . Transportation needs:     Medical: Not on file    Non-medical: Not on file  Tobacco Use  . Smoking status: Never Smoker  . Smokeless tobacco: Never Used  . Tobacco comment: tobacco use - no  Substance and Sexual Activity  . Alcohol use: Yes    Alcohol/week: 0.0 oz    Comment: occasionally a glass of wine or margarita per pt  . Drug use: No  . Sexual activity: Not on file  Lifestyle  . Physical activity:    Days per week: Not on file    Minutes per session: Not on file  . Stress:  Not on file  Relationships  . Social connections:    Talks on phone: Not on file    Gets together: Not on file    Attends religious service: Not on file    Active member of club or organization: Not on file    Attends meetings of clubs or organizations: Not on file    Relationship status: Not on file  Other Topics Concern  . Not on file  Social History Narrative   Married, retired. 3 children. Regular exercise -yes; hobbies = swimming.     Review of Systems: A 12 point ROS discussed and pertinent positives are indicated in the HPI above.  All other systems are negative.  Review of Systems  Constitutional: Negative for activity change and fever.  Respiratory: Negative for shortness of breath and wheezing.   Cardiovascular: Negative for chest pain and palpitations.  Gastrointestinal:       Negative for bowel incontinence.  Genitourinary:       Positive for bladder incontinence.  Musculoskeletal: Positive for back pain.  Neurological: Negative for numbness.  Psychiatric/Behavioral: Negative for behavioral problems and confusion.    Vital Signs: BP (!) 197/83   Pulse 71   Temp (!) 97.2 F (36.2 C) (Oral)   Resp 16   Ht 5\' 3"  (1.6 m)   Wt 155 lb (70.3 kg)   SpO2 92%   BMI 27.46 kg/m   Physical Exam  Constitutional: She is oriented to person, place, and time. She appears well-developed and well-nourished. No distress.  Cardiovascular: Normal rate, regular rhythm, normal heart sounds and intact distal pulses.    No murmur heard. Pulmonary/Chest: Effort normal and breath sounds normal. No respiratory distress. She has no wheezes.  Musculoskeletal:  Mild tenderness of midline lower back.  Neurological: She is alert and oriented to person, place, and time.  Skin: Skin is warm and dry.  Psychiatric: She has a normal mood and affect. Her behavior is normal. Judgment and thought content normal.  Nursing note and vitals reviewed.    MD Evaluation Airway: WNL Heart: WNL Abdomen: WNL Chest/ Lungs: WNL ASA  Classification: 2 Mallampati/Airway Score: One   Imaging: Mr Lumbar Spine Wo Contrast  Result Date: 08/21/2017 CLINICAL DATA:  Lumbar radiculopathy.  Back pain for over 6 weeks EXAM: MRI LUMBAR SPINE WITHOUT CONTRAST TECHNIQUE: Multiplanar, multisequence MR imaging of the lumbar spine was performed. No intravenous contrast was administered. COMPARISON:  Lumbar spine CT 01/02/2016 FINDINGS: Segmentation:  5 lumbar type vertebral bodies based on prior. Alignment: Reversal of lumbar lordosis with mild anterolisthesis at L2-3 and L3-4. Mild retrolisthesis at L5-S1. levoscoliosis. Vertebrae: T12 body fracture with horizontal fracture plane following the inferior endplate and containing fluid. There is diffuse marrow edema. No paravertebral mass is seen. No reported history of metastatic disease. Height loss is mild. Remote L2 and L3 body fractures.  L2 has undergone vertebroplasty. Conus medullaris and cauda equina: Conus extends to the L1-2 level. Conus and cauda equina appear normal. Paraspinal and other soft tissues: Fatty atrophy of intrinsic back muscles, pronounced. Disc levels: T12- L1: Posterior element hypertrophy and mild fracture retropulsion causes CSF effacement without cord compression, moderate narrowing. Noncompressive bilateral foraminal narrowing. L1-L2: Facet arthropathy with asymmetric hypertrophy on the right. Ligamentum flavum thickening. The disc is narrowed and bulging. Moderate spinal  stenosis with crowding of the cauda equina. Patent foramina. L2-L3: Posttraumatic disc distortion and posterior element hypertrophy. AP thecal sac narrowing with overall mild spinal stenosis. Patent bilateral foramina L3-L4: Facet arthropathy with hypertrophy  and joint distortion. Disc bulging and remote mild retropulsion. High-grade spinal stenosis. Patent foramina L4-L5: Interbody fusion.  Patent canal and foramina L5-S1:Advanced disc degeneration. Posterior element hypertrophy. There are changes of prior right-sided laminotomy. Right foraminal narrowing appears advanced on sagittal acquisition and mild on axial slices. Patent canal. These results will be called to the ordering clinician or representative by the Radiologist Assistant, and communication documented in the PACS or zVision Dashboard. IMPRESSION: 1. Recent/unhealed T12 body fracture with marrow edema and fluid-filled fracture cleft. Height loss and retropulsion is mild. 2. Remote L2 and L3 compression fractures. 3. Advanced disc and facet degeneration with kyphoscoliosis. 4. Spinal stenosis is moderate at T12-L1/L1-2 and advanced at L3-4. 5. L4-5 solid bony fusion. Electronically Signed   By: Monte Fantasia M.D.   On: 08/21/2017 14:10    Labs:  CBC: Recent Labs    02/12/17 2014 02/13/17 0744 02/14/17 0510  WBC 7.2 5.6 6.7  HGB 12.1 12.0 11.9*  HCT 37.5 36.9 37.5  PLT 327 295 320    COAGS: No results for input(s): INR, APTT in the last 8760 hours.  BMP: Recent Labs    01/14/17 1031 02/12/17 2014 02/13/17 0744 02/14/17 0510  NA 144 143 139 139  K 4.5 3.6 3.9 4.2  CL 105 106 101 103  CO2 25 25 30 27   GLUCOSE 95 133* 98 97  BUN 19 23* 25* 28*  CALCIUM 9.5 9.2 9.0 8.6*  CREATININE 1.32* 1.44* 1.49* 1.55*  GFRNONAA 37* 32* 31* 30*  GFRAA 43* 37* 36* 34*    LIVER FUNCTION TESTS: Recent Labs    12/04/16 0939 05/31/17 0858  BILITOT 0.5 0.4  AST 28 14  ALT 17 11  ALKPHOS 56 67  PROT 7.3 7.1  ALBUMIN 4.1 3.8     TUMOR MARKERS: No results for input(s): AFPTM, CEA, CA199, CHROMGRNA in the last 8760 hours.  Assessment and Plan:  Thoracic 12 compression fracture. Plan for image-guided thoracic 12 kyphoplasty/vertebroplasty today with Dr. Estanislado Pandy. Patient is NPO.  She is prescribed Eliquis, states last dose was 09/01/2017. Denies fever and WBCs WNL. INR pending.  Risks and benefits of thoracic 12 kyphoplasty/vertebroplasty were discussed with the patient including, but not limited to education regarding the natural healing process of compression fractures without intervention, bleeding, infection, cement migration which may cause spinal cord damage, paralysis, pulmonary embolism or even death. This interventional procedure involves the use of X-rays and because of the nature of the planned procedure, it is possible that we will have prolonged use of X-ray fluoroscopy. Potential radiation risks to you include (but are not limited to) the following: - A slightly elevated risk for cancer  several years later in life. This risk is typically less than 0.5% percent. This risk is low in comparison to the normal incidence of human cancer, which is 33% for women and 50% for men according to the [place]. - Radiation induced injury can include skin redness, resembling a rash, tissue breakdown / ulcers and hair loss (which can be temporary or permanent).  The likelihood of either of these occurring depends on the difficulty of the procedure and whether you are sensitive to radiation due to previous procedures, disease, or genetic conditions.  IF your procedure requires a prolonged use of radiation, you will be notified and given written instructions for further action.  It is your responsibility to monitor the irradiated area for the 2 weeks following the procedure and to notify your physician if you are concerned that  you have suffered a radiation induced injury.   All of the patient's questions  were answered, patient is agreeable to proceed. Consent signed and in chart.  Thank you for this interesting consult.  I greatly enjoyed meeting Brenda Hall and look forward to participating in their care.  A copy of this report was sent to the requesting provider on this date.  Electronically Signed: Earley Abide, PA-C 09/05/2017, 11:35 AM   I spent a total of 25 Minutes in face to face in clinical consultation, greater than 50% of which was counseling/coordinating care for thoracic 12 compression fracture.

## 2017-09-05 NOTE — Discharge Instructions (Addendum)
Restart xarelto tomorrow  Percutaneous Vertebroplasty, Care After These instructions give you information on caring for yourself after your procedure. Your doctor may also give you more specific instructions. Call your doctor if you have any problems or questions after your procedure. Follow these instructions at home:  Take medicine as told by your doctor.  Keep your wound dry and covered for 24 hours or as told by your doctor.  Ask your doctor when you can bathe or shower.  Put an ice pack on your wound. ? Put ice in a plastic bag. ? Place a towel between your skin and the bag. ? Leave the ice on for 15-20 minutes, 3-4 times a day.  Rest in your bed for 24 hours or as told by your doctor.  Return to normal activities as told by your doctor.  Ask your doctor what stretches and exercises you can do.  Do not bend or lift anything heavy as told by your doctor. Contact a doctor if:  Your wound becomes red, puffy (swollen), or tender to the touch.  You are bleeding or leaking fluid from the wound.  You are sick to your stomach (nauseous) or throw up (vomit) for more than 24 hours after the procedure.  Your back pain does not get better.  You have a fever. Get help right away if:  You have bad back pain that comes on suddenly.  You cannot control when you pee (urinate) or poop (bowel movement).  You lose feeling (numbness) or have tingling in your legs or feet, or they become weak.  You have sudden weakness in your arms or legs.  You have shooting pain down your legs.  You have chest pain or a hard time breathing.  You feel dizzy or pass out (faint).  Your vision changes or you cannot talk as you normally do. This information is not intended to replace advice given to you by your health care provider. Make sure you discuss any questions you have with your health care provider. Document Released: 07/03/2009 Document Revised: 09/14/2015 Document Reviewed:  12/15/2012 Elsevier Interactive Patient Education  2018 [employer]. Balloon Kyphoplasty Balloon kyphoplasty is a procedure to treat a spinal compression fracture, which is a collapse of the bones that form the spine (vertebrae). With this type of fracture, the vertebrae become squashed (compressed) into a wedge shape, and this causes pain. In this procedure, the collapsed vertebrae are expanded with a balloon, and bone cement is injected into them to strengthen them. Tell a health care provider about:  Any allergies you have.  All medicines you are taking, including vitamins, herbs, eye drops, creams, and over-the-counter medicines.  Any problems you or family members have had with anesthetic medicines.  Any blood disorders you have.  Any surgeries you have had.  Any medical conditions you have.  Whether you are pregnant or may be pregnant. What are the risks? Generally, this is a safe procedure. However, problems may occur, including:  Infection.  Bleeding.  Allergic reactions to medicines.  Damage to other structures or organs.  Leaking of bone cement into other parts of the body.  What happens before the procedure?  Follow instructions from your health care provider about eating or drinking restrictions.  Ask your health care provider about: ? Changing or stopping your regular medicines. This is especially important if you are taking diabetes medicines or blood thinners. ? Taking medicines such as aspirin and ibuprofen. These medicines can thin your blood. Do not take these medicines  before your procedure if your health care provider instructs you not to.  Ask your health care provider how your surgical site will be marked or identified.  You may be given antibiotic medicine to help prevent infection.  Do not use tobacco products, including cigarettes, chewing tobacco, or e-cigarettes. If you need help quitting, ask your health care provider.  Plan to have someone  take you home after the procedure.  If you go home right after the procedure, plan to have someone with you for 24 hours. What happens during the procedure?  To reduce your risk of infection: ? Your health care team will wash or sanitize their hands. ? Your skin will be washed with soap.  An IV tube will be inserted into one of your veins.  You will be given one or more of the following: ? A medicine to help you relax (sedative). ? A medicine to numb the area (local anesthetic). ? A medicine to make you fall asleep (general anesthetic).  Your surgeon will use an X-ray machine to see your spinal compression fracture.  Two small incisions will be made near your spine.  A thin tube will be inserted into your spine. Through this tube, the balloon will be placed in your spine where the fractures are.  The balloon will be inflated. This will create space and push the bone back toward its normal height and shape.  The balloon will be removed.  The newly created space in your spine will be filled with bone cement.  When the cement hardens, the tube in your spine will be removed.  Your incisions will be closed with stitches (sutures), skin glue, or adhesive strips.  A bandage (dressing) may be used to cover your incisions. The procedure may vary among health care providers and hospitals. What happens after the procedure?  Your blood pressure, heart rate, breathing rate, and blood oxygen level will be monitored often until the medicines you were given have worn off.  You will have some pain. Pain medicine will be available to help you. This information is not intended to replace advice given to you by your health care provider. Make sure you discuss any questions you have with your health care provider. Document Released: 03/14/2004 Document Revised: 09/14/2015 Document Reviewed: 08/01/2014 Elsevier Interactive Patient Education  2018 [place]. Moderate Conscious Sedation, Adult,  Care After These instructions provide you with information about caring for yourself after your procedure. Your health care provider may also give you more specific instructions. Your treatment has been planned according to current medical practices, but problems sometimes occur. Call your health care provider if you have any problems or questions after your procedure. What can I expect after the procedure? After your procedure, it is common:  To feel sleepy for several hours.  To feel clumsy and have poor balance for several hours.  To have poor judgment for several hours.  To vomit if you eat too soon.  Follow these instructions at home: For at least 24 hours after the procedure:   Do not: ? Participate in activities where you could fall or become injured. ? Drive. ? Use heavy machinery. ? Drink alcohol. ? Take sleeping pills or medicines that cause drowsiness. ? Make important decisions or sign legal documents. ? Take care of children on your own.  Rest. Eating and drinking  Follow the diet recommended by your health care provider.  If you vomit: ? Drink water, juice, or soup when you can drink without vomiting. ?  Make sure you have little or no nausea before eating solid foods. General instructions  Have a responsible adult stay with you until you are awake and alert.  Take over-the-counter and prescription medicines only as told by your health care provider.  If you smoke, do not smoke without supervision.  Keep all follow-up visits as told by your health care provider. This is important. Contact a health care provider if:  You keep feeling nauseous or you keep vomiting.  You feel light-headed.  You develop a rash.  You have a fever. Get help right away if:  You have trouble breathing. This information is not intended to replace advice given to you by your health care provider. Make sure you discuss any questions you have with your health care  provider. Document Released: 01/27/2013 Document Revised: 09/11/2015 Document Reviewed: 07/29/2015 Elsevier Interactive Patient Education  [employer].

## 2017-09-05 NOTE — Procedures (Signed)
S /P T 12 VP 

## 2017-09-07 ENCOUNTER — Encounter: Payer: Self-pay | Admitting: Cardiology

## 2017-09-08 ENCOUNTER — Encounter (HOSPITAL_COMMUNITY): Payer: Self-pay | Admitting: Interventional Radiology

## 2017-09-10 DIAGNOSIS — Z85828 Personal history of other malignant neoplasm of skin: Secondary | ICD-10-CM | POA: Diagnosis not present

## 2017-09-10 DIAGNOSIS — C441122 Basal cell carcinoma of skin of right lower eyelid, including canthus: Secondary | ICD-10-CM | POA: Diagnosis not present

## 2017-09-16 ENCOUNTER — Other Ambulatory Visit (HOSPITAL_COMMUNITY): Payer: Self-pay | Admitting: Interventional Radiology

## 2017-09-16 DIAGNOSIS — Z961 Presence of intraocular lens: Secondary | ICD-10-CM | POA: Diagnosis not present

## 2017-09-16 DIAGNOSIS — M4850XA Collapsed vertebra, not elsewhere classified, site unspecified, initial encounter for fracture: Secondary | ICD-10-CM

## 2017-09-18 ENCOUNTER — Other Ambulatory Visit: Payer: Self-pay | Admitting: Rheumatology

## 2017-09-18 NOTE — Telephone Encounter (Signed)
Last visit: 06/24/17 Next Visit: 10/02/17  Okay to refill per Dr. Estanislado Pandy

## 2017-09-18 NOTE — Progress Notes (Deleted)
Office Visit Note  Patient: Brenda Hall             Date of Birth: 1931-01-30           MRN: [id number]             PCP: [name], MD Referring: [name], MD Visit Date: 10/02/2017 Occupation: @GUAROCC @    Subjective:  No chief complaint on file.   History of Present Illness: Brenda Hall Picking is a 84 y.o. female ***   Activities of Daily Living:  Patient reports morning stiffness for *** {minute/hour:19697}.   Patient {ACTIONS;DENIES/REPORTS:21021675::"Denies"} nocturnal pain.  Difficulty dressing/grooming: {ACTIONS;DENIES/REPORTS:21021675::"Denies"} Difficulty climbing stairs: {ACTIONS;DENIES/REPORTS:21021675::"Denies"} Difficulty getting out of chair: {ACTIONS;DENIES/REPORTS:21021675::"Denies"} Difficulty using hands for taps, buttons, cutlery, and/or writing: {ACTIONS;DENIES/REPORTS:21021675::"Denies"}   No Rheumatology ROS completed.   PMFS History:  Patient Active Problem List   Diagnosis Date Noted  . Atrial fibrillation with rapid ventricular response ([place]) 02/12/2017  . Hyperlipidemia 02/12/2017  . Pulmonary edema 02/12/2017  . Urgency of micturition 02/12/2017  . Osteopenia of multiple sites 07/23/2016  . Age-related osteoporosis without current pathological fracture 07/11/2016  . History of total knee replacement, bilateral 07/11/2016  . History of COPD 07/11/2016  . Spondylosis of lumbar region without myelopathy or radiculopathy 07/11/2016  . Primary osteoarthritis of both hands 07/11/2016  . Primary osteoarthritis of both feet 07/11/2016  . Vitamin D deficiency 07/11/2016  . History of vertebral fracture 07/11/2016  . History of IBS 07/11/2016  . Basal cell carcinoma 07/11/2016  . History of CVA (cerebrovascular accident)/ has metal implant not MRI safe  07/11/2016  . Other and unspecified hyperlipidemia 11/19/2012  . Cerebrovascular accident (stroke) ([place]) 11/19/2012  . Difficulty in walking(719.7) 11/06/2012  . Occlusion  and stenosis of carotid artery without mention of cerebral infarction 02/07/2012  . OLD MYOCARDIAL INFARCTION 11/07/2009  . CAD, NATIVE VESSEL 11/07/2009  . Essential hypertension 09/28/2008  . IRRITABLE BOWEL SYNDROME 09/28/2008  . DEGENERATIVE JOINT DISEASE, KNEE 09/28/2008  . POLYMYALGIA RHEUMATICA 09/28/2008  . Chest pain, unspecified 09/28/2008    Past Medical History:  Diagnosis Date  . Anemia   . CAD in native artery    a. NSTEMI 12/2005 s/p BMS to Cx, prior normal LVEF.  [facility] Kitchen Cancer ([place]) 1988 and  2009   BCC nose and  Melanoma back  . Cancer of skin Jan. 2014   [facility]head [facility] Nose  . Carotid stenosis    mild  . Chronic back pain   . CKD (chronic kidney disease), stage III ([place])   . Coronary atherosclerosis of unspecified type of vessel, native or graft   . DJD (degenerative joint disease) of knee   . History of TIA (transient ischemic attack)   . HTN (hypertension)   . IBS (irritable bowel syndrome)   . Myocardial infarction [facility])    5 yrs ago     dr wall  cardiac  . Osteoporosis   . Other diseases of lung, not elsewhere classified    lung nodule  . Polymyalgia rheumatica ([place])   . Shingles 01/2016-02/2016  . Stroke [facility])     Family History  Problem Relation Age of Onset  . Heart disease Father   . Hypertension Father   . Heart attack Father   . Cancer Mother        bladder   . Hypertension Daughter   . Atrial fibrillation Daughter   . Hypertension Son   . Colon cancer Neg Hx    Past Surgical  History:  Procedure Laterality Date  . ABDOMINAL HYSTERECTOMY  1977   partial  . BACK SURGERY  1982  . CARDIAC CATHETERIZATION    . COLONOSCOPY N/A 01/27/2013   Procedure: COLONOSCOPY;  Surgeon: Rogene Houston, MD;  Location: [facility];  Service: Endoscopy;  Laterality: N/A;  1030  . EYE SURGERY Bilateral 04/2017  . IR VERTEBROPLASTY CERV/THOR BX INC UNI/BIL INC/INJECT/IMAGING  09/05/2017  . JOINT REPLACEMENT  2001 and  2011   6 knee surgeries   bil  replacement  . KNEE ARTHROSCOPY    . LUMBAR LAMINECTOMY    . PR VEIN BYPASS GRAFT,AORTO-FEM-POP  2010  . removal of melanoma from back  2007  . SPINE SURGERY  2013   Broken back: fusion surgery  . tonisllectomy  1944  . total knee anthroplasty    . VERTEBROPLASTY  07/11/2011   Procedure: VERTEBROPLASTY;  Surgeon: Faythe Ghee, MD;  Location: [place] [facility]URO ORS;  Service: Neurosurgery;  Laterality: N/A;  Vertebroplasty of Lumbar Two   Social History   Social History Narrative   Married, retired. 3 children. Regular exercise -yes; hobbies = swimming.     Objective: Vital Signs: There were no vitals taken for this visit.   Physical Exam   Musculoskeletal Exam: ***  CDAI Exam: No CDAI exam completed.    Investigation: No additional findings. CBC Latest Ref Rng & Units 09/05/2017 02/14/2017 02/13/2017  WBC 4.0 - 10.5 K/uL 9.5 6.7 5.6  Hemoglobin 12.0 - 15.0 g/dL 12.4 11.9(L) 12.0  Hematocrit 36.0 - 46.0 % 39.9 37.5 36.9  Platelets 150 - 400 K/uL 423(Hall) 320 295   CMP Latest Ref Rng & Units 09/05/2017 05/31/2017 02/14/2017  Glucose 65 - 99 mg/dL 100(Hall) - 97  BUN 6 - 20 mg/dL 22(Hall) - 28(Hall)  Creatinine 0.44 - 1.00 mg/dL 1.54(Hall) - 1.55(Hall)  Sodium 135 - 145 mmol/L 141 - 139  Potassium 3.5 - 5.1 mmol/L 4.4 - 4.2  Chloride 101 - 111 mmol/L 104 - 103  CO2 22 - 32 mmol/L 28 - 27  Calcium 8.9 - 10.3 mg/dL 9.3 - 8.6(L)  Total Protein 6.0 - 8.5 g/dL - 7.1 -  Total Bilirubin 0.0 - 1.2 mg/dL - 0.4 -  Alkaline Phos 39 - 117 IU/L - 67 -  AST 0 - 40 IU/L - 14 -  ALT 0 - 32 IU/L - 11 -    Imaging: Mr Lumbar Spine Wo Contrast  Result Date: 08/21/2017 CLINICAL DATA:  Lumbar radiculopathy.  Back pain for over 6 weeks EXAM: MRI LUMBAR SPINE WITHOUT CONTRAST TECHNIQUE: Multiplanar, multisequence MR imaging of the lumbar spine was performed. No intravenous contrast was administered. COMPARISON:  Lumbar spine CT 01/02/2016 FINDINGS: Segmentation:  5 lumbar type vertebral bodies based on prior.  Alignment: Reversal of lumbar lordosis with mild anterolisthesis at L2-3 and L3-4. Mild retrolisthesis at L5-S1. levoscoliosis. Vertebrae: T12 body fracture with horizontal fracture plane following the inferior endplate and containing fluid. There is diffuse marrow edema. No paravertebral mass is seen. No reported history of metastatic disease. Height loss is mild. Remote L2 and L3 body fractures.  L2 has undergone vertebroplasty. Conus medullaris and cauda equina: Conus extends to the L1-2 level. Conus and cauda equina appear normal. Paraspinal and other soft tissues: Fatty atrophy of intrinsic back muscles, pronounced. Disc levels: T12- L1: Posterior element hypertrophy and mild fracture retropulsion causes CSF effacement without cord compression, moderate narrowing. Noncompressive bilateral foraminal narrowing. L1-L2: Facet arthropathy with asymmetric hypertrophy on the right. Ligamentum flavum  thickening. The disc is narrowed and bulging. Moderate spinal stenosis with crowding of the cauda equina. Patent foramina. L2-L3: Posttraumatic disc distortion and posterior element hypertrophy. AP thecal sac narrowing with overall mild spinal stenosis. Patent bilateral foramina L3-L4: Facet arthropathy with hypertrophy and joint distortion. Disc bulging and remote mild retropulsion. High-grade spinal stenosis. Patent foramina L4-L5: Interbody fusion.  Patent canal and foramina L5-S1:Advanced disc degeneration. Posterior element hypertrophy. There are changes of prior right-sided laminotomy. Right foraminal narrowing appears advanced on sagittal acquisition and mild on axial slices. Patent canal. These results will be called to the ordering clinician or representative by the Radiologist Assistant, and communication documented in the PACS or zVision Dashboard. IMPRESSION: 1. Recent/unhealed T12 body fracture with marrow edema and fluid-filled fracture cleft. Height loss and retropulsion is mild. 2. Remote L2 and L3  compression fractures. 3. Advanced disc and facet degeneration with kyphoscoliosis. 4. Spinal stenosis is moderate at T12-L1/L1-2 and advanced at L3-4. 5. L4-5 solid bony fusion. Electronically Signed   By: Monte Fantasia M.D.   On: 08/21/2017 14:10   Ir Vertebroplasty Cerv/thor Bx Inc Uni/bil Inc/inject/imaging  Result Date: 09/08/2017 INDICATION: Severe thoracolumbar pain secondary to compression fracture at T12. EXAM: VERTEBROPLASTY T12 MEDICATIONS: As antibiotic prophylaxis, Ancef 2 g IV was ordered pre-procedure and administered intravenously within 1 hour of incision. ANESTHESIA/SEDATION: Moderate (conscious) sedation was employed during this procedure. A total of Versed 2 mg and Fentanyl 75 mcg was administered intravenously. Moderate Sedation Time: 19 minutes. The patient's level of consciousness and vital signs were monitored continuously by radiology nursing throughout the procedure under my direct supervision. FLUOROSCOPY TIME:  Fluoroscopy Time: 6 minutes 24 seconds (762 mGy) COMPLICATIONS: None immediate. TECHNIQUE: Informed written consent was obtained from the patient after a thorough discussion of the procedural risks, benefits and alternatives. All questions were addressed. Maximal Sterile Barrier Technique was utilized including caps, mask, sterile gowns, sterile gloves, sterile drape, hand hygiene and skin antiseptic. A timeout was performed prior to the initiation of the procedure. PROCEDURE: The patient was placed prone on the fluoroscopic table. Nasal oxygen was administered. Physiologic monitoring was performed throughout the duration of the procedure. The skin overlying the thoracolumbar region was prepped and draped in the usual sterile fashion. The T12 vertebral body was identified and the right pedicle was infiltrated with 0.25% Bupivacaine. This was then followed by the advancement of a 13-gauge Cook needle through the right pedicle into the anterior one-third at T12. A gentle  contrast injection demonstrated a trabecular pattern of contrast. At this time, methylmethacrylate mixture was reconstituted. Under biplane intermittent fluoroscopy, the methylmethacrylate was then injected into the T12 vertebral body with filling of the vertebral body. No extravasation was noted into the disk spaces or posteriorly into the spinal canal. No epidural venous contamination was seen. The needle was then removed. Hemostasis was achieved at the skin entry site. There were no acute complications. Patient tolerated the procedure well. The patient was observed for 3 hours and discharged in good condition. IMPRESSION: 1. Status post vertebral body augmentation for painful compression fracture at T12 using vertebroplasty technique. Electronically Signed   By: Luanne Bras M.D.   On: 09/05/2017 14:41    Speciality Comments: No specialty comments available.    Procedures:  No procedures performed Allergies: Epinephrine; Clopidogrel bisulfate; and Oxycodone   Assessment / Plan:     Visit Diagnoses: No diagnosis found.    Orders: No orders of the defined types were placed in this encounter.  No orders of the  defined types were placed in this encounter.   Face-to-face time spent with patient was *** minutes. 50% of time was spent in counseling and coordination of care.  Follow-Up Instructions: No follow-ups on file.   Earnestine Mealing, CMA  Note - This record has been created using Editor, commissioning.  Chart creation errors have been sought, but may not always  have been located. Such creation errors do not reflect on  the standard of medical care.

## 2017-09-22 ENCOUNTER — Ambulatory Visit (INDEPENDENT_AMBULATORY_CARE_PROVIDER_SITE_OTHER): Payer: Medicare Other | Admitting: Specialist

## 2017-09-23 DIAGNOSIS — Z85828 Personal history of other malignant neoplasm of skin: Secondary | ICD-10-CM | POA: Diagnosis not present

## 2017-09-23 DIAGNOSIS — S0181XS Laceration without foreign body of other part of head, sequela: Secondary | ICD-10-CM | POA: Diagnosis not present

## 2017-09-23 DIAGNOSIS — Z483 Aftercare following surgery for neoplasm: Secondary | ICD-10-CM | POA: Diagnosis not present

## 2017-09-23 DIAGNOSIS — S01411S Laceration without foreign body of right cheek and temporomandibular area, sequela: Secondary | ICD-10-CM | POA: Diagnosis not present

## 2017-09-23 DIAGNOSIS — H04541 Stenosis of right lacrimal canaliculi: Secondary | ICD-10-CM | POA: Diagnosis not present

## 2017-09-23 DIAGNOSIS — S01401A Unspecified open wound of right cheek and temporomandibular area, initial encounter: Secondary | ICD-10-CM | POA: Diagnosis not present

## 2017-09-23 DIAGNOSIS — S0541XS Penetrating wound of orbit with or without foreign body, right eye, sequela: Secondary | ICD-10-CM | POA: Diagnosis not present

## 2017-09-23 DIAGNOSIS — Z481 Encounter for planned postprocedural wound closure: Secondary | ICD-10-CM | POA: Diagnosis not present

## 2017-09-23 DIAGNOSIS — S01111S Laceration without foreign body of right eyelid and periocular area, sequela: Secondary | ICD-10-CM | POA: Diagnosis not present

## 2017-09-23 DIAGNOSIS — C441122 Basal cell carcinoma of skin of right lower eyelid, including canthus: Secondary | ICD-10-CM | POA: Diagnosis not present

## 2017-09-23 HISTORY — PX: EYE SURGERY: SHX253

## 2017-09-25 ENCOUNTER — Telehealth: Payer: Self-pay | Admitting: Student

## 2017-09-25 NOTE — Telephone Encounter (Signed)
Received message from patient regarding back pain. Patient underwent image-guided thoracic 12 vertebroplasty 09/05/2017 with Dr. Estanislado Pandy. She is scheduled for consult follow-up with Dr. Estanislado Pandy 09/26/2017.  Patient states that she is having terrible back spasms. States she has been having back spasms for 4 months, but they have gotten worse over the past week. She used to only have them when she was changing positions, but now has them at rest. States that the pain from her thoracic 12 fracture has improved significantly since procedure 09/05/2017, but she cannot handle the back spasms anymore. States she has been taking muscle relaxers for the pain with little relief. Denies numbness/tingling down legs or bladder/bowel incontinence. Patient states that she is able to wait until tomorrow to see Dr. Estanislado Pandy for pain management, but asks if Dr. Estanislado Pandy will give her an epidural back injection tomorrow in consultation.  Discussed case with Dr. Estanislado Pandy.  Informed patient, per Dr. Estanislado Pandy, that he does not preform epidural back injections. Informed patient that we will discuss how to manage her pain tomorrow in consultation.  All questions answered and concerns addressed. Patient conveys understanding and agrees with plan.  Bea Graff Louk, PA-C 09/25/2017, 3:40 PM

## 2017-09-26 ENCOUNTER — Inpatient Hospital Stay (HOSPITAL_COMMUNITY)
Admission: EM | Admit: 2017-09-26 | Discharge: 2017-10-02 | DRG: 515 | Disposition: A | Discharge status: Skilled Nursing Facility | Payer: Medicare Other | Attending: Internal Medicine | Admitting: Internal Medicine

## 2017-09-26 ENCOUNTER — Other Ambulatory Visit (HOSPITAL_COMMUNITY): Payer: Self-pay | Admitting: Interventional Radiology

## 2017-09-26 ENCOUNTER — Other Ambulatory Visit: Payer: Self-pay

## 2017-09-26 ENCOUNTER — Telehealth (INDEPENDENT_AMBULATORY_CARE_PROVIDER_SITE_OTHER): Payer: Self-pay | Admitting: Radiology

## 2017-09-26 ENCOUNTER — Ambulatory Visit (HOSPITAL_COMMUNITY)
Admission: RE | Admit: 2017-09-26 | Discharge: 2017-09-26 | Disposition: A | Discharge status: Home / Self Care | Payer: Medicare Other | Source: Ambulatory Visit | Attending: Interventional Radiology | Admitting: Interventional Radiology

## 2017-09-26 ENCOUNTER — Encounter (HOSPITAL_COMMUNITY): Payer: Self-pay

## 2017-09-26 ENCOUNTER — Telehealth: Payer: Self-pay | Admitting: Student

## 2017-09-26 DIAGNOSIS — Z8709 Personal history of other diseases of the respiratory system: Secondary | ICD-10-CM

## 2017-09-26 DIAGNOSIS — M4856XA Collapsed vertebra, not elsewhere classified, lumbar region, initial encounter for fracture: Principal | ICD-10-CM | POA: Diagnosis present

## 2017-09-26 DIAGNOSIS — M545 Low back pain, unspecified: Secondary | ICD-10-CM

## 2017-09-26 DIAGNOSIS — R32 Unspecified urinary incontinence: Secondary | ICD-10-CM | POA: Diagnosis present

## 2017-09-26 DIAGNOSIS — I6529 Occlusion and stenosis of unspecified carotid artery: Secondary | ICD-10-CM | POA: Diagnosis present

## 2017-09-26 DIAGNOSIS — Z8673 Personal history of transient ischemic attack (TIA), and cerebral infarction without residual deficits: Secondary | ICD-10-CM

## 2017-09-26 DIAGNOSIS — Z85820 Personal history of malignant melanoma of skin: Secondary | ICD-10-CM

## 2017-09-26 DIAGNOSIS — G8929 Other chronic pain: Secondary | ICD-10-CM | POA: Diagnosis present

## 2017-09-26 DIAGNOSIS — J189 Pneumonia, unspecified organism: Secondary | ICD-10-CM

## 2017-09-26 DIAGNOSIS — Z7901 Long term (current) use of anticoagulants: Secondary | ICD-10-CM | POA: Diagnosis not present

## 2017-09-26 DIAGNOSIS — S32012S Unstable burst fracture of first lumbar vertebra, sequela: Secondary | ICD-10-CM

## 2017-09-26 DIAGNOSIS — Z888 Allergy status to other drugs, medicaments and biological substances status: Secondary | ICD-10-CM

## 2017-09-26 DIAGNOSIS — I2510 Atherosclerotic heart disease of native coronary artery without angina pectoris: Secondary | ICD-10-CM | POA: Diagnosis present

## 2017-09-26 DIAGNOSIS — Z981 Arthrodesis status: Secondary | ICD-10-CM

## 2017-09-26 DIAGNOSIS — E785 Hyperlipidemia, unspecified: Secondary | ICD-10-CM | POA: Diagnosis present

## 2017-09-26 DIAGNOSIS — J440 Chronic obstructive pulmonary disease with acute lower respiratory infection: Secondary | ICD-10-CM | POA: Diagnosis not present

## 2017-09-26 DIAGNOSIS — I252 Old myocardial infarction: Secondary | ICD-10-CM

## 2017-09-26 DIAGNOSIS — N183 Chronic kidney disease, stage 3 unspecified: Secondary | ICD-10-CM

## 2017-09-26 DIAGNOSIS — I480 Paroxysmal atrial fibrillation: Secondary | ICD-10-CM

## 2017-09-26 DIAGNOSIS — N390 Urinary tract infection, site not specified: Secondary | ICD-10-CM | POA: Diagnosis present

## 2017-09-26 DIAGNOSIS — B9620 Unspecified Escherichia coli [E. coli] as the cause of diseases classified elsewhere: Secondary | ICD-10-CM | POA: Diagnosis present

## 2017-09-26 DIAGNOSIS — Z7982 Long term (current) use of aspirin: Secondary | ICD-10-CM

## 2017-09-26 DIAGNOSIS — S32019A Unspecified fracture of first lumbar vertebra, initial encounter for closed fracture: Secondary | ICD-10-CM | POA: Diagnosis present

## 2017-09-26 DIAGNOSIS — S32018B Other fracture of first lumbar vertebra, initial encounter for open fracture: Secondary | ICD-10-CM

## 2017-09-26 DIAGNOSIS — Z90710 Acquired absence of both cervix and uterus: Secondary | ICD-10-CM

## 2017-09-26 DIAGNOSIS — S32011K Stable burst fracture of first lumbar vertebra, subsequent encounter for fracture with nonunion: Secondary | ICD-10-CM

## 2017-09-26 DIAGNOSIS — M4805 Spinal stenosis, thoracolumbar region: Secondary | ICD-10-CM | POA: Diagnosis present

## 2017-09-26 DIAGNOSIS — R509 Fever, unspecified: Secondary | ICD-10-CM

## 2017-09-26 DIAGNOSIS — J449 Chronic obstructive pulmonary disease, unspecified: Secondary | ICD-10-CM | POA: Diagnosis present

## 2017-09-26 DIAGNOSIS — M62830 Muscle spasm of back: Secondary | ICD-10-CM | POA: Diagnosis present

## 2017-09-26 DIAGNOSIS — M4850XA Collapsed vertebra, not elsewhere classified, site unspecified, initial encounter for fracture: Secondary | ICD-10-CM

## 2017-09-26 DIAGNOSIS — I10 Essential (primary) hypertension: Secondary | ICD-10-CM | POA: Diagnosis present

## 2017-09-26 DIAGNOSIS — C4491 Basal cell carcinoma of skin, unspecified: Secondary | ICD-10-CM | POA: Diagnosis present

## 2017-09-26 DIAGNOSIS — Z79899 Other long term (current) drug therapy: Secondary | ICD-10-CM

## 2017-09-26 DIAGNOSIS — Z85828 Personal history of other malignant neoplasm of skin: Secondary | ICD-10-CM

## 2017-09-26 DIAGNOSIS — M419 Scoliosis, unspecified: Secondary | ICD-10-CM | POA: Diagnosis present

## 2017-09-26 DIAGNOSIS — M810 Age-related osteoporosis without current pathological fracture: Secondary | ICD-10-CM | POA: Diagnosis present

## 2017-09-26 DIAGNOSIS — Z8249 Family history of ischemic heart disease and other diseases of the circulatory system: Secondary | ICD-10-CM

## 2017-09-26 DIAGNOSIS — Z7952 Long term (current) use of systemic steroids: Secondary | ICD-10-CM

## 2017-09-26 DIAGNOSIS — M353 Polymyalgia rheumatica: Secondary | ICD-10-CM | POA: Diagnosis present

## 2017-09-26 DIAGNOSIS — Z885 Allergy status to narcotic agent status: Secondary | ICD-10-CM

## 2017-09-26 DIAGNOSIS — Z96653 Presence of artificial knee joint, bilateral: Secondary | ICD-10-CM | POA: Diagnosis present

## 2017-09-26 DIAGNOSIS — I129 Hypertensive chronic kidney disease with stage 1 through stage 4 chronic kidney disease, or unspecified chronic kidney disease: Secondary | ICD-10-CM | POA: Diagnosis present

## 2017-09-26 MED ORDER — CYCLOBENZAPRINE HCL 10 MG PO TABS
10.0000 mg | ORAL_TABLET | Freq: Once | ORAL | Status: AC
  Administered 2017-09-26: 10 mg via ORAL
  Filled 2017-09-26: qty 1

## 2017-09-26 MED ORDER — FENTANYL CITRATE (PF) 100 MCG/2ML IJ SOLN
25.0000 ug | Freq: Once | INTRAMUSCULAR | Status: AC
  Administered 2017-09-26: 25 ug via INTRAMUSCULAR
  Filled 2017-09-26: qty 0.5

## 2017-09-26 MED ORDER — FENTANYL CITRATE (PF) 100 MCG/2ML IJ SOLN
INTRAMUSCULAR | Status: AC
  Filled 2017-09-26: qty 2

## 2017-09-26 MED ORDER — FENTANYL CITRATE (PF) 100 MCG/2ML IJ SOLN: 25.0000 ug | Freq: Once | INTRAMUSCULAR | Status: DC

## 2017-09-26 MED ORDER — MORPHINE SULFATE (PF) 4 MG/ML IV SOLN
4.0000 mg | Freq: Once | INTRAVENOUS | Status: AC
  Administered 2017-09-26: 4 mg via INTRAVENOUS
  Filled 2017-09-26: qty 1

## 2017-09-26 NOTE — ED Provider Notes (Signed)
[facility] EMERGENCY DEPARTMENT Provider Note   CSN: [id number] Arrival date & time: 09/26/17  1449     History   Chief Complaint Chief Complaint  Patient presents with  . Back Pain    HPI Brenda Hall is a 84 y.o. female.  The history is provided by the patient and medical records. No language interpreter was used.  Back Pain   This is a recurrent problem. The current episode started yesterday. The problem occurs constantly. The problem has not changed since onset.The pain is associated with no known injury. The pain is present in the lumbar spine. The quality of the pain is described as stabbing and aching. The pain does not radiate. The pain is at a severity of 10/10. The pain is severe. Associated symptoms include bladder incontinence (at baseline). Pertinent negatives include no chest pain, no fever, no numbness, no weight loss, no headaches, no abdominal pain, no abdominal swelling, no bowel incontinence, no dysuria, no pelvic pain, no leg pain and no weakness. She has tried nothing for the symptoms.    Past Medical History:  Diagnosis Date  . Anemia   . CAD in native artery    a. NSTEMI 12/2005 s/p BMS to Cx, prior normal LVEF.  Marland Kitchen Cancer ([place]) 1988 and  2009   BCC nose and  Melanoma back  . Cancer of skin Jan. 2014   [facility] Forehead and Nose  . Carotid stenosis    mild  . Chronic back pain   . CKD (chronic kidney disease), stage III ([place])   . Coronary atherosclerosis of unspecified type of vessel, native or graft   . DJD (degenerative joint disease) of knee   . History of TIA (transient ischemic attack)   . HTN (hypertension)   . IBS (irritable bowel syndrome)   . Myocardial infarction [facility])    5 yrs ago     dr wall  cardiac  . Osteoporosis   . Other diseases of lung, not elsewhere classified    lung nodule  . Polymyalgia rheumatica ([place])   . Shingles 01/2016-02/2016  . Stroke [facility])     Patient Active Problem List   Diagnosis Date Noted   . Atrial fibrillation with rapid ventricular response ([place]) 02/12/2017  . Hyperlipidemia 02/12/2017  . Pulmonary edema 02/12/2017  . Urgency of micturition 02/12/2017  . Osteopenia of multiple sites 07/23/2016  . Age-related osteoporosis without current pathological fracture 07/11/2016  . History of total knee replacement, bilateral 07/11/2016  . History of COPD 07/11/2016  . Spondylosis of lumbar region without myelopathy or radiculopathy 07/11/2016  . Primary osteoarthritis of both hands 07/11/2016  . Primary osteoarthritis of both feet 07/11/2016  . Vitamin D deficiency 07/11/2016  . History of vertebral fracture 07/11/2016  . History of IBS 07/11/2016  . Basal cell carcinoma 07/11/2016  . History of CVA (cerebrovascular accident)/ has metal implant not MRI safe  07/11/2016  . Other and unspecified hyperlipidemia 11/19/2012  . Cerebrovascular accident (stroke) ([place]) 11/19/2012  . Difficulty in walking(719.7) 11/06/2012  . Occlusion and stenosis of carotid artery without mention of cerebral infarction 02/07/2012  . OLD MYOCARDIAL INFARCTION 11/07/2009  . CAD, NATIVE VESSEL 11/07/2009  . Essential hypertension 09/28/2008  . IRRITABLE BOWEL SYNDROME 09/28/2008  . DEGENERATIVE JOINT DISEASE, KNEE 09/28/2008  . POLYMYALGIA RHEUMATICA 09/28/2008  . Chest pain, unspecified 09/28/2008    Past Surgical History:  Procedure Laterality Date  . ABDOMINAL HYSTERECTOMY  1977   partial  . BACK SURGERY  1982  .  CARDIAC CATHETERIZATION    . COLONOSCOPY N/A 01/27/2013   Procedure: COLONOSCOPY;  Surgeon: Rogene Houston, MD;  Location: [facility];  Service: Endoscopy;  Laterality: N/A;  1030  . EYE SURGERY Bilateral 04/2017  . IR VERTEBROPLASTY CERV/THOR BX INC UNI/BIL INC/INJECT/IMAGING  09/05/2017  . JOINT REPLACEMENT  2001 and  2011   6 knee surgeries   bil replacement  . KNEE ARTHROSCOPY    . LUMBAR LAMINECTOMY    . PR VEIN BYPASS GRAFT,AORTO-FEM-POP  2010  . removal of melanoma  from back  2007  . SPINE SURGERY  2013   Broken back: fusion surgery  . tonisllectomy  1944  . total knee anthroplasty    . VERTEBROPLASTY  07/11/2011   Procedure: VERTEBROPLASTY;  Surgeon: Faythe Ghee, MD;  Location: [facility] NEURO ORS;  Service: Neurosurgery;  Laterality: N/A;  Vertebroplasty of Lumbar Two     OB History   None      Home Medications    Prior to Admission medications   Medication Sig Start Date End Date Taking? Authorizing Provider  amLODipine (NORVASC) 5 MG tablet Take 1 tablet (5 mg total) by mouth daily. 06/17/17 09/15/17  Arnoldo Lenis, MD  aspirin 81 MG tablet Take 81 mg by mouth daily.    [provider]  atorvastatin (LIPITOR) 80 MG tablet Take 1 tablet (80 mg total) by mouth at bedtime. 05/29/17   [name], MD  Calcium Carbonate-Vit D-Min (CALCIUM 1200 PO) Take 1 tablet by mouth at bedtime.     [provider]  estrogens, conjugated, (PREMARIN) 0.3 MG tablet Take 1 tablet (0.3 mg total) by mouth every evening. 02/14/17   Johnson, Clanford L, MD  fluticasone (FLONASE) 50 MCG/ACT nasal spray USE TWO SPRAYS IN EACH NOSTRIL ONCE DAILY Patient taking differently: Place 1 spray into both nostrils daily as needed for allergies. USE TWO SPRAYS IN EACH NOSTRIL ONCE DAILY 12/03/16   [name], MD  furosemide (LASIX) 20 MG tablet Take 20 mg DAILY AS NEEDED FOR SWELLING. Patient taking differently: Take 20 mg by mouth daily as needed for fluid. Take 20 mg DAILY AS NEEDED FOR SWELLING. 09/02/17   Arnoldo Lenis, MD  gabapentin (NEURONTIN) 100 MG capsule Take 1 capsule (100 mg total) by mouth at bedtime. 08/27/17   Jessy Oto, MD  loperamide (IMODIUM) 2 MG capsule Take 2 mg by mouth as needed for diarrhea or loose stools.     [provider]  metoprolol succinate (TOPROL-XL) 50 MG 24 hr tablet Take 1 and 1/2 Tablets At Night Patient taking differently: Take 75 mg by mouth every evening. Take 1 and 1/2 Tablets At Night 04/17/17    Lendon Colonel, NP  nitroGLYCERIN (NITROSTAT) 0.4 MG SL tablet Place 1 tablet (0.4 mg total) under the tongue every 5 (five) minutes as needed for chest pain. 12/03/16   [name], MD  pantoprazole (PROTONIX) 40 MG tablet TAKE 1 TABLET EVERY MORNING 04/21/17   Bo Merino, MD  predniSONE (DELTASONE) 1 MG tablet Take 7 mg by mouth daily with breakfast.    [provider]  predniSONE (DELTASONE) 5 MG tablet TAKE 1 TABLET BY MOUTH DAILY WITH BREAKFAST(TAPER BY 1 MG EVERY MONTH) 09/18/17   Bo Merino, MD  Rivaroxaban (XARELTO) 15 MG TABS tablet TAKE ONE TABLET (15MG  TOTAL) BY MOUTH DAILY WITH SUPPER 04/18/17   Arnoldo Lenis, MD  Zinc 50 MG CAPS Take 50 mg by mouth daily.  [provider]    Family History Family History  Problem Relation Age of Onset  . Heart disease Father   . Hypertension Father   . Heart attack Father   . Cancer Mother        bladder   . Hypertension Daughter   . Atrial fibrillation Daughter   . Hypertension Son   . Colon cancer Neg Hx     Social History Social History   Tobacco Use  . Smoking status: Never Smoker  . Smokeless tobacco: Never Used  . Tobacco comment: tobacco use - no  Substance Use Topics  . Alcohol use: Yes    Alcohol/week: 0.0 oz    Comment: occasionally a glass of wine or margarita per pt  . Drug use: No     Allergies   Epinephrine; Clopidogrel bisulfate; and Oxycodone   Review of Systems Review of Systems  Constitutional: Negative for chills, diaphoresis, fatigue, fever and weight loss.  HENT: Negative for congestion.   Eyes: Negative for visual disturbance.  Respiratory: Negative for cough, chest tightness, shortness of breath, wheezing and stridor.   Cardiovascular: Negative for chest pain and palpitations.  Gastrointestinal: Negative for abdominal pain, bowel incontinence, constipation, diarrhea, nausea and vomiting.  Genitourinary: Positive for bladder incontinence (at baseline).  Negative for dysuria, flank pain, frequency and pelvic pain.  Musculoskeletal: Positive for back pain. Negative for neck pain and neck stiffness.  Skin: Negative for rash and wound.  Neurological: Negative for weakness, light-headedness, numbness and headaches.  Psychiatric/Behavioral: Negative for agitation.  All other systems reviewed and are negative.    Physical Exam Updated Vital Signs BP (!) 158/71 (BP Location: Right Arm)   Pulse 72   Temp 98 F (36.7 C) (Oral)   Resp 16   Ht 5\' 3"  (1.6 m)   Wt 70.3 kg (155 lb)   SpO2 98%   BMI 27.46 kg/m   Physical Exam  Constitutional: She is oriented to person, place, and time. She appears well-developed and well-nourished. No distress.  HENT:  Head: Normocephalic and atraumatic.  Nose: Nose normal.  Mouth/Throat: Oropharynx is clear and moist. No oropharyngeal exudate.  Eyes: Pupils are equal, round, and reactive to light. Conjunctivae are normal.  Neck: Normal range of motion. Neck supple.  Cardiovascular: Normal rate and regular rhythm.  No murmur heard. Pulmonary/Chest: Effort normal and breath sounds normal. No respiratory distress. She has no wheezes. She has no rales. She exhibits no tenderness.  Abdominal: Soft. There is no tenderness. There is no guarding.  Musculoskeletal: She exhibits no edema.       Lumbar back: She exhibits tenderness and spasm.       Back:  Neurological: She is alert and oriented to person, place, and time. No cranial nerve deficit or sensory deficit. She exhibits normal muscle tone.  Skin: Skin is warm and dry. Capillary refill takes less than 2 seconds. No rash noted. She is not diaphoretic. No erythema.  Psychiatric: She has a normal mood and affect.  Nursing note and vitals reviewed.    ED Treatments / Results  Labs (all labs ordered are listed, but only abnormal results are displayed) Labs Reviewed - No data to display  EKG None  Radiology No results found.  Procedures Procedures  (including critical care time)  Medications Ordered in ED Medications  morphine 4 MG/ML injection 4 mg (4 mg Intravenous Given 09/26/17 1958)  cyclobenzaprine (FLEXERIL) tablet 10 mg (10 mg Oral Given 09/26/17 2255)  morphine 4 MG/ML injection 4  mg (4 mg Intravenous Given 09/27/17 0108)     Initial Impression / Assessment and Plan / ED Course  I have reviewed the triage vital signs and the nursing notes.  Pertinent labs & imaging results that were available during my care of the patient were reviewed by me and considered in my medical decision making (see chart for details).     Brenda Hall is a 84 y.o. female with a past medical history significant for TIA/stroke, CAD with MI, hypertension, CKD, and chronic back pain status post T12 vertebroplasty on 09/05/2017 who presents with worsened back pain.  Patient reports that she was sent to the emergency department by her back surgeon for MRI to look for new fracture.  Patient says that she had a procedures several weeks ago on her thoracic spine and says that now she is having pain in her low lumbar spine.  She says that she told her doctor today and he sent her for MRI.  Patient reportedly had too much pain to lay flat for the image so she was sent to the ED for pain control so she can get her images obtained.  She denies numbness, tingling, weakness of the legs.  Patient is able to stand however she intermittently has severe 10 out of 10 low back pain.  She reports she has chronic urinary incontinence and this is not change from prior.  No stool incontinence.  No falls or recent trauma.  She denies fevers, chills, or other complaints.  She reports that yesterday her pain changed and is now in the low back and is 10 out of 10 in severity.  She says this is different than her previous pain.  This is why she was told to come for further imaging.  On exam, patient had tenderness in her paraspinal lumbar area.  No CVA tenderness.  No abdominal  tenderness.  Lungs are clear.  Patient normal sensation and strength in lower extremities.  Based on the patient's report that she was sent for MRI by her spine physician to look for new fracture with her new pain, MRI will be ordered.  Patient says that the pain is intermittent and may be muscular, she will be given muscle relaxant.  Patient given pain medicine to be used when she is getting her MRI.  She reports intimately has pain up to 10 out of 10.  Anticipate following up on MRI results.  Care transferred to Dr. Ellender Hose while awaiting MRI.  If MRI is reassuring, anticipate patient will be stable for discharge home with prescription for likely a muscle relaxant and possibly a pain medication.    Final Clinical Impressions(s) / ED Diagnoses   Final diagnoses:  Acute bilateral low back pain without sciatica    Clinical Impression: 1. Acute bilateral low back pain without sciatica     Disposition: Awaiting MRI results.  Care transferred to Dr. Ellender Hose in stable condition.     Tegeler, Gwenyth Allegra, MD 09/27/17 [phone]

## 2017-09-26 NOTE — Telephone Encounter (Signed)
Fyi. Please see messages below.

## 2017-09-26 NOTE — Telephone Encounter (Signed)
Received and returned page from Benjiman Core, PA-C regarding patient. Patient underwent thoracic 12 vertebroplasty 09/05/2017 with Dr. Estanislado Pandy. Dr. Estanislado Pandy called Benjiman Core, PA-C earlier today regarding patient's back pain and need for pain medication. Spoke with [place], RT.  Gwinda Passe states that in order for patient to receive pain medication, she will have to be re-evaluated by either Dr. Louanne Skye or Benjiman Core, PA-C.  All questions answered and concerns addressed.  Bea Graff Sachit Gilman, PA-C 09/26/2017, 4:25 PM

## 2017-09-26 NOTE — ED Triage Notes (Signed)
Pt endorses back pain for several weeks, sent by MRI because they could not get pt to lay flat due to severe pain. Pt was given fentanyl IM without relief. Recent hx of back surgery. VSS

## 2017-09-26 NOTE — Telephone Encounter (Signed)
Benjiman Core, PA-C called back to the office this afternoon in regards to a message he received from Dr. Estanislado Pandy with [facility].  The message stated that the patient had vertebral augmentation a couple of weeks ago and was being seen by Dr. Estanislado Pandy for follow up. The patient is complaining of pain and spasms and Dr. Estanislado Pandy requested Jeneen Rinks supply the patient with pain medication as she is a patient of Dr. Louanne Skye.  Dr. Estanislado Pandy is ordering a STAT MRI on the patient today.  Jeneen Rinks asked me to call Dr. Estanislado Pandy and explain that he is not in the office and that Dr. Estanislado Pandy should write prescription for pain medication post procedure.  I attempted to reach Dr. Estanislado Pandy at [phone] and he was in an emergent procedure. I was given his PA, Allie's, pager number as [phone]. I paged this number and am awaiting response.    In the meantime, I have spoken with Jeneen Rinks and relayed that per the chart, patient could not have MRI due to pain and was being sent to the ED for pain control prior to scan. Jeneen Rinks asked me to call [phone] and have them relay the message to Dr. Estanislado Pandy that he is unable to provide the patient with pain medication.

## 2017-09-26 NOTE — Consult Note (Addendum)
Chief Complaint: Patient was seen in consultation today for thoracic 12 compression fracture s/p vertebroplasty.  Referring Physician(s): Jessy Oto  Supervising Physician: Luanne Bras  Patient Status: [facility] - Out-pt  History of Present Illness: Brenda Hall is a 84 y.o. female with a past medical history of hypertension, CAD, MI 2007, afib, carotid stenosis, hyperlipidemia, TIA, CVA, severe osteoporosis, osteoarthritis, polymyalgia rheumatica, and lumbar 2 compression fracture s/p vertebroplasty 06/2011 by Dr. Hal Neer. She underwent a thoracic 12 vertebroplasty 09/05/2017 with Dr. Estanislado Pandy.  Patient presents today for follow-up regarding her recent thoracic 12 vertebroplasty. Patient alert and awake sitting in wheelchair. Patient reports significant improvement in her back pain from her thoracic 12 fracture. Complains of terrible back spasms for the past few days. Rates pain associated with back spasms 10/10. States these back spasms occur at rest. Reports that she had back spasms prior to procedure, but nothing like this as they used to occur when changing positions. Denies recent fall or injury. States she uses Methocarbamol and Acetaminophen OTC for the back spasms with minimal improvement. Denies fever, chills, numbness/tingling down legs, or bladder/bowel incontinence.  Past Medical History:  Diagnosis Date  . Anemia   . CAD in native artery    a. NSTEMI 12/2005 s/p BMS to Cx, prior normal LVEF.  Marland Kitchen Cancer ([place]) 1988 and  2009   BCC nose and  Melanoma back  . Cancer of skin Jan. 2014   [facility] Forehead and Nose  . Carotid stenosis    mild  . Chronic back pain   . CKD (chronic kidney disease), stage III ([place])   . Coronary atherosclerosis of unspecified type of vessel, native or graft   . DJD (degenerative joint disease) of knee   . History of TIA (transient ischemic attack)   . HTN (hypertension)   . IBS (irritable bowel syndrome)   . Myocardial infarction  [facility])    5 yrs ago     dr wall  cardiac  . Osteoporosis   . Other diseases of lung, not elsewhere classified    lung nodule  . Polymyalgia rheumatica ([place])   . Shingles 01/2016-02/2016  . Stroke [facility])     Past Surgical History:  Procedure Laterality Date  . ABDOMINAL HYSTERECTOMY  1977   partial  . BACK SURGERY  1982  . CARDIAC CATHETERIZATION    . COLONOSCOPY N/A 01/27/2013   Procedure: COLONOSCOPY;  Surgeon: Rogene Houston, MD;  Location: [facility] ENDO SUITE;  Service: Endoscopy;  Laterality: N/A;  1030  . EYE SURGERY Bilateral 04/2017  . IR VERTEBROPLASTY CERV/THOR BX INC UNI/BIL INC/INJECT/IMAGING  09/05/2017  . JOINT REPLACEMENT  2001 and  2011   6 knee surgeries   bil replacement  . KNEE ARTHROSCOPY    . LUMBAR LAMINECTOMY    . PR VEIN BYPASS GRAFT,AORTO-FEM-POP  2010  . removal of melanoma from back  2007  . SPINE SURGERY  2013   Broken back: fusion surgery  . tonisllectomy  1944  . total knee anthroplasty    . VERTEBROPLASTY  07/11/2011   Procedure: VERTEBROPLASTY;  Surgeon: Faythe Ghee, MD;  Location: [place] NEURO ORS;  Service: Neurosurgery;  Laterality: N/A;  Vertebroplasty of Lumbar Two    Allergies: Epinephrine; Clopidogrel bisulfate; and Oxycodone  Medications: Prior to Admission medications   Medication Sig Start Date End Date Taking? Authorizing Provider  amLODipine (NORVASC) 5 MG tablet Take 1 tablet (5 mg total) by mouth daily. 06/17/17 09/15/17  Arnoldo Lenis, MD  aspirin  81 MG tablet Take 81 mg by mouth daily.    [provider]  atorvastatin (LIPITOR) 80 MG tablet Take 1 tablet (80 mg total) by mouth at bedtime. 05/29/17   [name], MD  Calcium Carbonate-Vit D-Min (CALCIUM 1200 PO) Take 1 tablet by mouth at bedtime.     [provider]  estrogens, conjugated, (PREMARIN) 0.3 MG tablet Take 1 tablet (0.3 mg total) by mouth every evening. 02/14/17   Johnson, Clanford L, MD  fluticasone (FLONASE) 50 MCG/ACT nasal spray USE TWO SPRAYS  IN EACH NOSTRIL ONCE DAILY Patient taking differently: Place 1 spray into both nostrils daily as needed for allergies. USE TWO SPRAYS IN EACH NOSTRIL ONCE DAILY 12/03/16   [name], MD  furosemide (LASIX) 20 MG tablet Take 20 mg DAILY AS NEEDED FOR SWELLING. Patient taking differently: Take 20 mg by mouth daily as needed for fluid. Take 20 mg DAILY AS NEEDED FOR SWELLING. 09/02/17   Arnoldo Lenis, MD  gabapentin (NEURONTIN) 100 MG capsule Take 1 capsule (100 mg total) by mouth at bedtime. 08/27/17   Jessy Oto, MD  loperamide (IMODIUM) 2 MG capsule Take 2 mg by mouth as needed for diarrhea or loose stools.     [provider]  metoprolol succinate (TOPROL-XL) 50 MG 24 hr tablet Take 1 and 1/2 Tablets At Night Patient taking differently: Take 75 mg by mouth every evening. Take 1 and 1/2 Tablets At Night 04/17/17   Lendon Colonel, NP  nitroGLYCERIN (NITROSTAT) 0.4 MG SL tablet Place 1 tablet (0.4 mg total) under the tongue every 5 (five) minutes as needed for chest pain. 12/03/16   [name], MD  pantoprazole (PROTONIX) 40 MG tablet TAKE 1 TABLET EVERY MORNING 04/21/17   Bo Merino, MD  predniSONE (DELTASONE) 1 MG tablet Take 7 mg by mouth daily with breakfast.    [provider]  predniSONE (DELTASONE) 5 MG tablet TAKE 1 TABLET BY MOUTH DAILY WITH BREAKFAST(TAPER BY 1 MG EVERY MONTH) 09/18/17   Bo Merino, MD  Rivaroxaban (XARELTO) 15 MG TABS tablet TAKE ONE TABLET (15MG  TOTAL) BY MOUTH DAILY WITH SUPPER 04/18/17   Arnoldo Lenis, MD  Zinc 50 MG CAPS Take 50 mg by mouth daily.    [provider]     Family History  Problem Relation Age of Onset  . Heart disease Father   . Hypertension Father   . Heart attack Father   . Cancer Mother        bladder   . Hypertension Daughter   . Atrial fibrillation Daughter   . Hypertension Son   . Colon cancer Neg Hx     Social History   Socioeconomic History  . Marital status:  Widowed    Spouse name: Not on file  . Number of children: Not on file  . Years of education: Not on file  . Highest education level: Not on file  Occupational History  . Not on file  Social Needs  . Financial resource strain: Not on file  . Food insecurity:    Worry: Not on file    Inability: Not on file  . Transportation needs:    Medical: Not on file    Non-medical: Not on file  Tobacco Use  . Smoking status: Never Smoker  . Smokeless tobacco: Never Used  . Tobacco comment: tobacco use - no  Substance and Sexual Activity  . Alcohol use: Yes    Alcohol/week: 0.0 oz  Comment: occasionally a glass of wine or margarita per pt  . Drug use: No  . Sexual activity: Not on file  Lifestyle  . Physical activity:    Days per week: Not on file    Minutes per session: Not on file  . Stress: Not on file  Relationships  . Social connections:    Talks on phone: Not on file    Gets together: Not on file    Attends religious service: Not on file    Active member of club or organization: Not on file    Attends meetings of clubs or organizations: Not on file    Relationship status: Not on file  Other Topics Concern  . Not on file  Social History Narrative   Married, retired. 3 children. Regular exercise -yes; hobbies = swimming.     Review of Systems: A 12 point ROS discussed and pertinent positives are indicated in the HPI above.  All other systems are negative.  Review of Systems  Constitutional: Negative for chills and fever.  Respiratory: Negative for shortness of breath and wheezing.   Cardiovascular: Negative for chest pain and palpitations.  Gastrointestinal:       Negative for bowel incontinence.  Genitourinary:       Negative for bladder incontinence.  Musculoskeletal: Positive for back pain.  Psychiatric/Behavioral: Negative for behavioral problems and confusion.    Vital Signs: There were no vitals taken for this visit.  Physical Exam  Constitutional: She is  oriented to person, place, and time. She appears well-developed and well-nourished. No distress.  Musculoskeletal:  Moderate tenderness of midline lower back at approximate level of lumbar 4/lumbar 5.  Neurological: She is alert and oriented to person, place, and time.  Skin: Skin is warm and dry.  Psychiatric: She has a normal mood and affect. Her behavior is normal. Judgment and thought content normal.  Nursing note and vitals reviewed.    Imaging: Ir Vertebroplasty Cerv/thor Bx Inc Uni/bil Inc/inject/imaging  Result Date: 09/08/2017 INDICATION: Severe thoracolumbar pain secondary to compression fracture at T12. EXAM: VERTEBROPLASTY T12 MEDICATIONS: As antibiotic prophylaxis, Ancef 2 g IV was ordered pre-procedure and administered intravenously within 1 hour of incision. ANESTHESIA/SEDATION: Moderate (conscious) sedation was employed during this procedure. A total of Versed 2 mg and Fentanyl 75 mcg was administered intravenously. Moderate Sedation Time: 19 minutes. The patient's level of consciousness and vital signs were monitored continuously by radiology nursing throughout the procedure under my direct supervision. FLUOROSCOPY TIME:  Fluoroscopy Time: 6 minutes 24 seconds (893 mGy) COMPLICATIONS: None immediate. TECHNIQUE: Informed written consent was obtained from the patient after a thorough discussion of the procedural risks, benefits and alternatives. All questions were addressed. Maximal Sterile Barrier Technique was utilized including caps, mask, sterile gowns, sterile gloves, sterile drape, hand hygiene and skin antiseptic. A timeout was performed prior to the initiation of the procedure. PROCEDURE: The patient was placed prone on the fluoroscopic table. Nasal oxygen was administered. Physiologic monitoring was performed throughout the duration of the procedure. The skin overlying the thoracolumbar region was prepped and draped in the usual sterile fashion. The T12 vertebral body was  identified and the right pedicle was infiltrated with 0.25% Bupivacaine. This was then followed by the advancement of a 13-gauge Cook needle through the right pedicle into the anterior one-third at T12. A gentle contrast injection demonstrated a trabecular pattern of contrast. At this time, methylmethacrylate mixture was reconstituted. Under biplane intermittent fluoroscopy, the methylmethacrylate was then injected into the T12 vertebral body  with filling of the vertebral body. No extravasation was noted into the disk spaces or posteriorly into the spinal canal. No epidural venous contamination was seen. The needle was then removed. Hemostasis was achieved at the skin entry site. There were no acute complications. Patient tolerated the procedure well. The patient was observed for 3 hours and discharged in good condition. IMPRESSION: 1. Status post vertebral body augmentation for painful compression fracture at T12 using vertebroplasty technique. Electronically Signed   By: Luanne Bras M.D.   On: 09/05/2017 14:41    Labs:  CBC: Recent Labs    02/12/17 2014 02/13/17 0744 02/14/17 0510 09/05/17 1116  WBC 7.2 5.6 6.7 9.5  HGB 12.1 12.0 11.9* 12.4  HCT 37.5 36.9 37.5 39.9  PLT 327 295 320 423*    COAGS: Recent Labs    09/05/17 1116  INR 0.98    BMP: Recent Labs    02/12/17 2014 02/13/17 0744 02/14/17 0510 09/05/17 1116  NA 143 139 139 141  K 3.6 3.9 4.2 4.4  CL 106 101 103 104  CO2 25 30 27 28   GLUCOSE 133* 98 97 100*  BUN 23* 25* 28* 22*  CALCIUM 9.2 9.0 8.6* 9.3  CREATININE 1.44* 1.49* 1.55* 1.54*  GFRNONAA 32* 31* 30* 30*  GFRAA 37* 36* 34* 35*    LIVER FUNCTION TESTS: Recent Labs    12/04/16 0939 05/31/17 0858  BILITOT 0.5 0.4  AST 28 14  ALT 17 11  ALKPHOS 56 67  PROT 7.3 7.1  ALBUMIN 4.1 3.8    TUMOR MARKERS: No results for input(s): AFPTM, CEA, CA199, CHROMGRNA in the last 8760 hours.  Assessment and Plan:  Thoracic 12 compression fracture s/p  vertebroplasty 09/05/2017 with Dr. Estanislado Pandy. Reviewed imaging with patient. Based on patient's clinical picture, it seems that her back spasms are not related to her thoracic 12 compression fracture. Evaluation for new fracture is needed.  Plan for follow-up with MRI lumbar spine ASAP to evaluate for new fractures. Dr. Estanislado Pandy will call Dr. Louanne Skye to discuss case and recommend pain medications. Instructed patient no bending, stooping, or lifting more than 10 pounds until results of MRI. Instructed patient to use warm compresses or ice on her back PRN for pain.  All questions answered and concerns addressed. Patient conveys understanding and agrees with plan.  Thank you for this interesting consult.  I greatly enjoyed meeting Brenda Hall and look forward to participating in their care.  A copy of this report was sent to the requesting provider on this date.  Electronically Signed: Earley Abide, PA-C 09/26/2017, 9:35 AM   I spent a total of 25 Minutes in face to face in clinical consultation, greater than 50% of which was counseling/coordinating care for thoracic 12 compression fracture s/p vertebroplasty.

## 2017-09-26 NOTE — ED Notes (Signed)
ED Provider at bedside. 

## 2017-09-26 NOTE — Progress Notes (Signed)
Pt was sent to MRI as a STAT out pt from Dr. Estanislado Pandy.  Pt was unable to lay down on MRI table due to pain.  Radiology RN gave pt injection for pain.  Pt still could not tolerate pain.  Anderson Malta (Dr. Buckner Malta secretary) suggested pt go through ED to get pain management before attempting MRI again... We tried for over an hour to get pt comfortable enough to lay down on table.

## 2017-09-26 NOTE — Telephone Encounter (Signed)
I spoke with Brenda Hall and advised that Brenda Hall is not comfortable giving the patient pain medication as he has not seen her and she has not been treated in the office since the procedure. Brenda Hall states that she understands and that the patient had been told Dr. Estanislado Pandy did not deal with pain management because he is a neuro interventionalist and that she would need to see Dr. Louanne Skye. The patient stated that she could not come to the office and see him and asked if they would call on her behalf. I advised that the patient is in the ED now and that maybe they could manage her pain since they are seeing her until she followed up with Dr. Louanne Skye. Brenda Hall felt this was appropriate.

## 2017-09-26 NOTE — ED Notes (Signed)
Reviewed notes from MRI.  Pt to be medicated adequately to be able to tolerate MRI.  Pt can not lay down.  Placed Recliner in hallway bed spot and explained to pt that in order to expedite her seeing a doctor we will place her in hallway.

## 2017-09-27 ENCOUNTER — Encounter (HOSPITAL_COMMUNITY): Payer: Self-pay | Admitting: Internal Medicine

## 2017-09-27 ENCOUNTER — Emergency Department (HOSPITAL_COMMUNITY): Payer: Medicare Other

## 2017-09-27 DIAGNOSIS — S32010A Wedge compression fracture of first lumbar vertebra, initial encounter for closed fracture: Secondary | ICD-10-CM | POA: Diagnosis not present

## 2017-09-27 DIAGNOSIS — N183 Chronic kidney disease, stage 3 unspecified: Secondary | ICD-10-CM | POA: Diagnosis present

## 2017-09-27 DIAGNOSIS — I480 Paroxysmal atrial fibrillation: Secondary | ICD-10-CM

## 2017-09-27 DIAGNOSIS — Z8673 Personal history of transient ischemic attack (TIA), and cerebral infarction without residual deficits: Secondary | ICD-10-CM

## 2017-09-27 DIAGNOSIS — S32019A Unspecified fracture of first lumbar vertebra, initial encounter for closed fracture: Secondary | ICD-10-CM | POA: Diagnosis present

## 2017-09-27 DIAGNOSIS — N390 Urinary tract infection, site not specified: Secondary | ICD-10-CM | POA: Diagnosis not present

## 2017-09-27 DIAGNOSIS — M545 Low back pain: Secondary | ICD-10-CM | POA: Diagnosis not present

## 2017-09-27 LAB — URINALYSIS, ROUTINE W REFLEX MICROSCOPIC
Bilirubin Urine: NEGATIVE
Glucose, UA: NEGATIVE mg/dL
Hgb urine dipstick: NEGATIVE
KETONES UR: NEGATIVE mg/dL
Nitrite: POSITIVE — AB
PH: 5 (ref 5.0–8.0)
PROTEIN: NEGATIVE mg/dL
Specific Gravity, Urine: 1.011 (ref 1.005–1.030)

## 2017-09-27 LAB — CBC
HCT: 41 % (ref 36.0–46.0)
HEMOGLOBIN: 12.6 g/dL (ref 12.0–15.0)
MCH: 29 pg (ref 26.0–34.0)
MCHC: 30.7 g/dL (ref 30.0–36.0)
MCV: 94.3 fL (ref 78.0–100.0)
Platelets: 395 10*3/uL (ref 150–400)
RBC: 4.35 MIL/uL (ref 3.87–5.11)
RDW: 14.3 % (ref 11.5–15.5)
WBC: 8.5 10*3/uL (ref 4.0–10.5)

## 2017-09-27 LAB — BASIC METABOLIC PANEL
Anion gap: 10 (ref 5–15)
BUN: 17 mg/dL (ref 6–20)
CHLORIDE: 104 mmol/L (ref 101–111)
CO2: 26 mmol/L (ref 22–32)
Calcium: 9.2 mg/dL (ref 8.9–10.3)
Creatinine, Ser: 1.32 mg/dL — ABNORMAL HIGH (ref 0.44–1.00)
GFR calc non Af Amer: 36 mL/min — ABNORMAL LOW (ref >60)
GFR, EST AFRICAN AMERICAN: 42 mL/min — AB (ref >60)
Glucose, Bld: 95 mg/dL (ref 65–99)
POTASSIUM: 4.4 mmol/L (ref 3.5–5.1)
SODIUM: 140 mmol/L (ref 135–145)

## 2017-09-27 MED ORDER — GABAPENTIN 100 MG PO CAPS
100.0000 mg | ORAL_CAPSULE | Freq: Every day | ORAL | Status: DC
  Administered 2017-09-27 – 2017-10-02 (×6): 100 mg via ORAL
  Filled 2017-09-27 (×6): qty 1

## 2017-09-27 MED ORDER — PANTOPRAZOLE SODIUM 40 MG PO TBEC
40.0000 mg | DELAYED_RELEASE_TABLET | Freq: Every morning | ORAL | Status: DC
  Administered 2017-09-27 – 2017-10-02 (×6): 40 mg via ORAL
  Filled 2017-09-27 (×6): qty 1

## 2017-09-27 MED ORDER — MORPHINE SULFATE (PF) 4 MG/ML IV SOLN
4.0000 mg | Freq: Once | INTRAVENOUS | Status: AC
  Administered 2017-09-27: 4 mg via INTRAVENOUS
  Filled 2017-09-27: qty 1

## 2017-09-27 MED ORDER — ONDANSETRON HCL 4 MG PO TABS: 4.0000 mg | ORAL_TABLET | Freq: Four times a day (QID) | ORAL | Status: DC | PRN

## 2017-09-27 MED ORDER — ATORVASTATIN CALCIUM 20 MG PO TABS
80.0000 mg | ORAL_TABLET | Freq: Every day | ORAL | Status: DC
  Administered 2017-09-27 – 2017-10-02 (×6): 80 mg via ORAL
  Filled 2017-09-27 (×7): qty 4

## 2017-09-27 MED ORDER — METOPROLOL SUCCINATE ER 50 MG PO TB24
75.0000 mg | ORAL_TABLET | Freq: Every evening | ORAL | Status: DC
  Administered 2017-09-27 – 2017-10-02 (×6): 75 mg via ORAL
  Filled 2017-09-27 (×6): qty 1

## 2017-09-27 MED ORDER — SENNOSIDES-DOCUSATE SODIUM 8.6-50 MG PO TABS: 1.0000 | ORAL_TABLET | Freq: Every evening | ORAL | Status: DC | PRN

## 2017-09-27 MED ORDER — NITROGLYCERIN 0.4 MG SL SUBL: 0.4000 mg | SUBLINGUAL_TABLET | SUBLINGUAL | Status: DC | PRN

## 2017-09-27 MED ORDER — FLUTICASONE PROPIONATE 50 MCG/ACT NA SUSP: 1.0000 | Freq: Every day | NASAL | Status: DC | PRN

## 2017-09-27 MED ORDER — SODIUM CHLORIDE 0.9 % IV SOLN
1.0000 g | INTRAVENOUS | Status: DC
  Administered 2017-09-28 – 2017-10-02 (×5): 1 g via INTRAVENOUS
  Filled 2017-09-27 (×6): qty 10

## 2017-09-27 MED ORDER — METHOCARBAMOL 500 MG PO TABS
500.0000 mg | ORAL_TABLET | Freq: Four times a day (QID) | ORAL | Status: DC | PRN
  Administered 2017-09-27 – 2017-10-02 (×11): 500 mg via ORAL
  Filled 2017-09-27 (×11): qty 1

## 2017-09-27 MED ORDER — FUROSEMIDE 20 MG PO TABS: 20.0000 mg | ORAL_TABLET | Freq: Every day | ORAL | Status: DC | PRN

## 2017-09-27 MED ORDER — SODIUM CHLORIDE 0.9 % IV SOLN
1.0000 g | Freq: Once | INTRAVENOUS | Status: AC
  Administered 2017-09-27: 1 g via INTRAVENOUS
  Filled 2017-09-27: qty 10

## 2017-09-27 MED ORDER — DEXAMETHASONE SODIUM PHOSPHATE 10 MG/ML IJ SOLN
10.0000 mg | Freq: Once | INTRAMUSCULAR | Status: AC
  Administered 2017-09-27: 10 mg via INTRAVENOUS
  Filled 2017-09-27: qty 1

## 2017-09-27 MED ORDER — FUROSEMIDE 20 MG PO TABS
20.0000 mg | ORAL_TABLET | Freq: Every day | ORAL | Status: AC
  Administered 2017-09-28: 20 mg via ORAL
  Filled 2017-09-27 (×2): qty 1

## 2017-09-27 MED ORDER — ESTROGENS CONJUGATED 0.3 MG PO TABS
0.3000 mg | ORAL_TABLET | Freq: Every evening | ORAL | Status: DC
  Administered 2017-09-27 – 2017-10-02 (×6): 0.3 mg via ORAL
  Filled 2017-09-27 (×6): qty 1

## 2017-09-27 MED ORDER — ACETAMINOPHEN 325 MG PO TABS
650.0000 mg | ORAL_TABLET | Freq: Four times a day (QID) | ORAL | Status: DC | PRN
  Administered 2017-09-27 – 2017-10-01 (×7): 650 mg via ORAL
  Filled 2017-09-27 (×7): qty 2

## 2017-09-27 MED ORDER — ONDANSETRON HCL 4 MG/2ML IJ SOLN: 4.0000 mg | Freq: Four times a day (QID) | INTRAMUSCULAR | Status: DC | PRN

## 2017-09-27 MED ORDER — AMLODIPINE BESYLATE 5 MG PO TABS
5.0000 mg | ORAL_TABLET | Freq: Every day | ORAL | Status: DC
  Administered 2017-09-27 – 2017-10-02 (×6): 5 mg via ORAL
  Filled 2017-09-27 (×6): qty 1

## 2017-09-27 MED ORDER — ACETAMINOPHEN 650 MG RE SUPP: 650.0000 mg | Freq: Four times a day (QID) | RECTAL | Status: DC | PRN

## 2017-09-27 MED ORDER — SODIUM CHLORIDE 0.9 % IV SOLN: INTRAVENOUS | Status: DC

## 2017-09-27 NOTE — ED Notes (Addendum)
istat chem 8 resulted @ 0358    Na  140 K     4.5 Cl   104 iCa 1.21 TC02 28  Glu   101mg  BUN  23mg  Crea 1.2mg  Hct     41%PCV Hb*   13.9 g/dL AnGap 13 mmo l/L

## 2017-09-27 NOTE — ED Notes (Signed)
Patient transported to MRI 

## 2017-09-27 NOTE — ED Provider Notes (Signed)
Assumed care from Dr. Sherry Ruffing.  Briefly, the patient is an 84 year old with known chronic back pain here with ongoing, severe back pain after recent kyphoplasty.  Patient sent by her interventional radiologist for further evaluation given that her degree of pain.  MRI is pending.  MRI shows new L1 fracture.  I suspect this is causing her pain.  Will need to be discussed with interventional radiology once again.  Otherwise, given her severe pain and inability to ambulate due to this, will admit. Sees Dr. Louanne Skye of [place] and d/w Dr. Laurence Slate, who is in agreement with plan.   Duffy Bruce, MD 09/27/17 [phone]

## 2017-09-27 NOTE — ED Notes (Signed)
Patient returned from MRI.

## 2017-09-27 NOTE — ED Notes (Signed)
Attempted report to [facility]; this RN was told by another RN that they are short staffed and are not taking additional pts at this time (they have 6 each at the moment) and to call bed placement; this RN called bed placement and was told they were unaware of the situation on [facility] and would calling the charge nurse to evaluate the situation

## 2017-09-27 NOTE — H&P (Signed)
History and Physical    Brenda Hall GNF:[id number] DOB: 08-24-1930 DOA: 09/26/2017  PCP: [name], MD Patient coming from: home  Chief Complaint: back pain  HPI: Brenda Hall is a 84 y.o. female with medical history significant for hypertension, CAD, MI 2007, A. Fib, carotid stenosis, hyperlipidemia, TIA, CVA, severe osteoporosis, osteoarthritis, polymyalgia rheumatica and lumbar 2 compression fracture status post vertebroplasy in march, as well as T12 vertebral plasty last month presents to the emergency department from interventional radiology chief complaint intractable back pain. MRI reveals acute L1 fracture. [facility] are asked to admit  Information is obtained from the patient and the chart. Patient reports a three-day history of intermittent back "spasms". During the spasms she has sharp lumbar spine pain. She reports taking Robaxin at home with some relief. She denies any recent fall or injury. Reports a procedure on her thoracic spine with good relief. Yesterday she was unable to lie down for the MRI they sent her to the emergency department for pain control. She denies any numbness tingling of her extremities. She denies any weakness in lower extremities. No fever chills nausea vomiting diarrhea constipation melena bright red blood per rectum. No dysuria hematuria frequency or urgency.    ED Course: n the emergency department she's afebrile hemodynamically stable and not hypoxic. workup does reveal urinary tract infection and she is started on Rocephin. She is also provided with Flexeril and morphine for pain management. At the time of admission pain within tolerable limits  Review of Systems: As per HPI otherwise all other systems reviewed and are negative.   Ambulatory Status:ambulates with a rollereator  Past Medical History:  Diagnosis Date  . Anemia   . CAD in native artery    a. NSTEMI 12/2005 s/p BMS to Cx, prior normal LVEF.  [facility] Cancer ([place])  1988 and  2009   BCC nose and  Melanoma back  . Cancer of skin Jan. 2014   [facility] Forehead and Nose  . Carotid stenosis    mild  . Chronic back pain   . CKD (chronic kidney disease), stage III ([place])   . Coronary atherosclerosis of unspecified type of vessel, native or graft   . DJD (degenerative joint disease) of knee   . History of TIA (transient ischemic attack)   . HTN (hypertension)   . IBS (irritable bowel syndrome)   . Myocardial infarction [facility])    5 yrs ago     dr wall  cardiac  . Osteoporosis   . Other diseases of lung, not elsewhere classified    lung nodule  . Polymyalgia rheumatica ([place])   . Shingles 01/2016-02/2016  . Stroke [facility])     Past Surgical History:  Procedure Laterality Date  . ABDOMINAL HYSTERECTOMY  1977   partial  . BACK SURGERY  1982  . CARDIAC CATHETERIZATION    . COLONOSCOPY N/A 01/27/2013   Procedure: COLONOSCOPY;  Surgeon: Rogene Houston, MD;  Location: [facility];  Service: Endoscopy;  Laterality: N/A;  1030  . EYE SURGERY Bilateral 04/2017  . IR VERTEBROPLASTY CERV/THOR BX INC UNI/BIL INC/INJECT/IMAGING  09/05/2017  . JOINT REPLACEMENT  2001 and  2011   6 knee surgeries   bil replacement  . KNEE ARTHROSCOPY    . LUMBAR LAMINECTOMY    . PR VEIN BYPASS GRAFT,AORTO-FEM-POP  2010  . removal of melanoma from back  2007  . SPINE SURGERY  2013   Broken back: fusion surgery  . tonisllectomy  1944  .  total knee anthroplasty    . VERTEBROPLASTY  07/11/2011   Procedure: VERTEBROPLASTY;  Surgeon: Faythe Ghee, MD;  Location: [place] NEURO ORS;  Service: Neurosurgery;  Laterality: N/A;  Vertebroplasty of Lumbar Two    Social History   Socioeconomic History  . Marital status: Widowed    Spouse name: Not on file  . Number of children: Not on file  . Years of education: Not on file  . Highest education level: Not on file  Occupational History  . Not on file  Social Needs  . Financial resource strain: Not on file  . Food insecurity:    Worry: Not  on file    Inability: Not on file  . Transportation needs:    Medical: Not on file    Non-medical: Not on file  Tobacco Use  . Smoking status: Never Smoker  . Smokeless tobacco: Never Used  . Tobacco comment: tobacco use - no  Substance and Sexual Activity  . Alcohol use: Yes    Alcohol/week: 0.0 oz    Comment: occasionally a glass of wine or margarita per pt  . Drug use: No  . Sexual activity: Not on file  Lifestyle  . Physical activity:    Days per week: Not on file    Minutes per session: Not on file  . Stress: Not on file  Relationships  . Social connections:    Talks on phone: Not on file    Gets together: Not on file    Attends religious service: Not on file    Active member of club or organization: Not on file    Attends meetings of clubs or organizations: Not on file    Relationship status: Not on file  . Intimate partner violence:    Fear of current or ex partner: Not on file    Emotionally abused: Not on file    Physically abused: Not on file    Forced sexual activity: Not on file  Other Topics Concern  . Not on file  Social History Narrative   Married, retired. 3 children. Regular exercise -yes; hobbies = swimming.    Allergies  Allergen Reactions  . Epinephrine Other (See Comments)    During dental procedure-caused facial swelling at injection site. Pt has had epinephrine since then for nose and facial surgery and did fine with it per pt.  . Clopidogrel Bisulfate Itching and Rash    Plavix  . Oxycodone     hallucinations    Family History  Problem Relation Age of Onset  . Heart disease Father   . Hypertension Father   . Heart attack Father   . Cancer Mother        bladder   . Hypertension Daughter   . Atrial fibrillation Daughter   . Hypertension Son   . Colon cancer Neg Hx     Prior to Admission medications   Medication Sig Start Date End Date Taking? Authorizing Provider  acetaminophen (TYLENOL) 500 MG tablet Take 500 mg by mouth every 6  (six) hours as needed for mild pain.   Yes [provider]  amLODipine (NORVASC) 5 MG tablet Take 5 mg by mouth daily.   Yes [provider]  aspirin 81 MG tablet Take 81 mg by mouth daily.   Yes [provider]  atorvastatin (LIPITOR) 80 MG tablet Take 1 tablet (80 mg total) by mouth at bedtime. 05/29/17  Yes [name], MD  Calcium Carbonate-Vit D-Min (CALCIUM 1200 PO) Take 1  tablet by mouth at bedtime.    Yes [provider]  estrogens, conjugated, (PREMARIN) 0.3 MG tablet Take 1 tablet (0.3 mg total) by mouth every evening. 02/14/17  Yes Johnson, Clanford L, MD  fluticasone (FLONASE) 50 MCG/ACT nasal spray USE TWO SPRAYS IN EACH NOSTRIL ONCE DAILY Patient taking differently: Place 1 spray into both nostrils daily as needed for allergies. USE TWO SPRAYS IN EACH NOSTRIL ONCE DAILY 12/03/16  Yes [name], MD  furosemide (LASIX) 20 MG tablet Take 20 mg DAILY AS NEEDED FOR SWELLING. Patient taking differently: Take 20 mg by mouth daily as needed for fluid. Take 20 mg DAILY AS NEEDED FOR SWELLING. 09/02/17  Yes BranchAlphonse Guild, MD  loperamide (IMODIUM) 2 MG capsule Take 2 mg by mouth daily as needed for diarrhea or loose stools.    Yes [provider]  methocarbamol (ROBAXIN) 500 MG tablet Take 500 mg by mouth every 8 (eight) hours as needed for muscle spasms.   Yes [provider]  metoprolol succinate (TOPROL-XL) 50 MG 24 hr tablet Take 1 and 1/2 Tablets At Night Patient taking differently: Take 75 mg by mouth every evening. Take 1 and 1/2 Tablets At Night 04/17/17  Yes Lendon Colonel, NP  nitroGLYCERIN (NITROSTAT) 0.4 MG SL tablet Place 1 tablet (0.4 mg total) under the tongue every 5 (five) minutes as needed for chest pain. 12/03/16  Yes [name], MD  pantoprazole (PROTONIX) 40 MG tablet TAKE 1 TABLET EVERY MORNING 04/21/17  Yes Deveshwar, Abel Presto, MD  predniSONE (DELTASONE) 5 MG tablet TAKE 1 TABLET BY MOUTH DAILY  WITH BREAKFAST(TAPER BY 1 MG EVERY MONTH) Patient taking differently: TAKE 6 MG TABLET BY MOUTH DAILY WITH BREAKFAST(TAPER BY 1 MG EVERY MONTH UNTIL COMPLETELY OFF) 09/18/17  Yes Deveshwar, Abel Presto, MD  Rivaroxaban (XARELTO) 15 MG TABS tablet TAKE ONE TABLET (15MG  TOTAL) BY MOUTH DAILY WITH SUPPER 04/18/17  Yes Branch, Alphonse Guild, MD  Zinc 50 MG CAPS Take 50 mg by mouth daily.   Yes [provider]  gabapentin (NEURONTIN) 100 MG capsule Take 1 capsule (100 mg total) by mouth at bedtime. Patient not taking: Reported on 09/27/2017 08/27/17   Jessy Oto, MD    Physical Exam: Vitals:   09/26/17 1510 09/26/17 2010 09/27/17 0228 09/27/17 0737  BP:  137/83 129/89 (!) 177/79  Pulse:  76 80 67  Resp:  16 14 16   Temp:      TempSrc:      SpO2:  99% 99% 97%  Weight: 70.3 kg (155 lb)     Height: 5\' 3"  (1.6 m)        General:  Appears calm and comfortable sitting in chair Eyes:  PERRL, EOMI, normal lids, iris ENT:  grossly normal hearing, lips & tongue, mucous membranes of her mouth are slightly dry but pink Neck:  no LAD, masses or thyromegaly Cardiovascular:  RRR, no m/r/g. 2+ bilateral LE edema.  Respiratory:  CTA bilaterally, no w/r/r. Normal respiratory effort. Abdomen:  soft, ntnd, positive bowel sounds throughout no guarding or rebounding Skin:  no rash or induration seen on limited exam Musculoskeletal:  grossly normal tone BUE/BLE, good ROM, no bony abnormality mild diffuse tenderness more spine. Lower extremity strength5 out of 5 bilaterally sensation intact Psychiatric:  grossly normal mood and affect, speech fluent and appropriate, AOx3 Neurologic:  CN 2-12 grossly intact, moves all extremities in coordinated fashion, sensation intact  Labs on Admission: I have personally reviewed following labs and imaging studies  CBC: Recent Labs  Lab 09/27/17 0349  WBC 8.5  HGB 12.6  HCT 41.0  MCV 94.3  PLT 341   Basic Metabolic Panel: Recent Labs  Lab 09/27/17 0520  NA 140    K 4.4  CL 104  CO2 26  GLUCOSE 95  BUN 17  CREATININE 1.32*  CALCIUM 9.2   GFR: Estimated Creatinine Clearance: 29.9 mL/min (A) (by C-G formula based on SCr of 1.32 mg/dL (H)). Liver Function Tests: No results for input(s): AST, ALT, ALKPHOS, BILITOT, PROT, ALBUMIN in the last 168 hours. No results for input(s): LIPASE, AMYLASE in the last 168 hours. No results for input(s): AMMONIA in the last 168 hours. Coagulation Profile: No results for input(s): INR, PROTIME in the last 168 hours. Cardiac Enzymes: No results for input(s): CKTOTAL, CKMB, CKMBINDEX, TROPONINI in the last 168 hours. BNP (last 3 results) No results for input(s): PROBNP in the last 8760 hours. HbA1C: No results for input(s): HGBA1C in the last 72 hours. CBG: No results for input(s): GLUCAP in the last 168 hours. Lipid Profile: No results for input(s): CHOL, HDL, LDLCALC, TRIG, CHOLHDL, LDLDIRECT in the last 72 hours. Thyroid Function Tests: No results for input(s): TSH, T4TOTAL, FREET4, T3FREE, THYROIDAB in the last 72 hours. Anemia Panel: No results for input(s): VITAMINB12, FOLATE, FERRITIN, TIBC, IRON, RETICCTPCT in the last 72 hours. Urine analysis:    Component Value Date/Time   COLORURINE YELLOW 09/27/2017 0041   APPEARANCEUR HAZY (A) 09/27/2017 0041   LABSPEC 1.011 09/27/2017 0041   PHURINE 5.0 09/27/2017 0041   GLUCOSEU NEGATIVE 09/27/2017 0041   HGBUR NEGATIVE 09/27/2017 0041   BILIRUBINUR NEGATIVE 09/27/2017 0041   KETONESUR NEGATIVE 09/27/2017 0041   PROTEINUR NEGATIVE 09/27/2017 0041   UROBILINOGEN 0.2 07/28/2008 1156   NITRITE POSITIVE (A) 09/27/2017 0041   LEUKOCYTESUR LARGE (A) 09/27/2017 0041    Creatinine Clearance: Estimated Creatinine Clearance: 29.9 mL/min (A) (by C-G formula based on SCr of 1.32 mg/dL (H)).  Sepsis Labs: @LABRCNTIP (procalcitonin:4,lacticidven:4) )No results found for this or any previous visit (from the past 240 hour(s)).   Radiological Exams on  Admission: Mr Lumbar Spine Wo Contrast  Result Date: 09/27/2017 CLINICAL DATA:  Initial evaluation for severe low back pain over past 2 days, recent vertebral augmentation at T12. EXAM: MRI LUMBAR SPINE WITHOUT CONTRAST TECHNIQUE: Multiplanar, multisequence MR imaging of the lumbar spine was performed. No intravenous contrast was administered. COMPARISON:  Prior MRI from 08/21/2017. FINDINGS: Segmentation: Normal segmentation. Lowest well-formed disc labeled the L5-S1 level. Same numbering system is employed as on previous exam. Alignment: Levoscoliosis with reversal of the normal lumbar lordosis. Stable anterolisthesis of L2 on L3 and L3 on L4, with trace retrolisthesis of L5 on S1. Vertebrae: Sequelae of interval vertebral augmentation at T12 for previously identified fracture. Increased height loss now measuring up to 40% centrally. Mild 5 mm retropulsion relatively stable. Persistent marrow edema without new fracture line. There is a new acute fracture extending through the superior endplate of L1 with abnormal marrow edema throughout the superior and mid aspect of the L1 vertebral body. Minimal central height loss without bony retropulsion. No underlying pathologic lesion. Chronic L2 fracture with sequelae of prior vertebral augmentation is unchanged. Chronic L3 fracture also stable. Bone marrow signal intensity heterogeneous but within normal limits. Prominent reactive endplate changes at the L4-5 and L5-S1 interspaces. No worrisome osseous lesions. Conus medullaris and cauda equina: Conus extends to the L1-2 level. Conus and cauda equina appear normal. Paraspinal and other soft tissues: Mild edema within  the paraspinous soft tissues adjacent to the T12 and L1 compression fractures. Chronic fatty atrophy noted within the lower posterior paraspinous musculature. Innumerable bilateral renal cysts noted, a few of which demonstrate T2 hypointensity with intermediate T1 signal, likely proteinaceous and/or  hemorrhagic cyst. Disc levels: T12-L1: 5 mm bony retropulsion related to the T12 compression fracture with mild disc bulge. Mild facet hypertrophy. Moderate spinal stenosis without cord impingement. Mild bilateral foraminal narrowing. L1-2: Diffuse disc bulge with intervertebral disc space narrowing. Moderate facet hypertrophy, greater on the right. Resultant moderate spinal stenosis, stable. Neural foramina remain patent. L2-3: Mild disc bulge with flattening of the ventral thecal sac. Moderate facet hypertrophy. Mild spinal stenosis, stable. Neural foramina remain patent. L3-4: Diffuse degenerative disc bulge with disc desiccation and intervertebral disc space narrowing. Advanced bilateral facet degeneration with ligamentum flavum hypertrophy. Moderate to severe spinal stenosis with left greater than right lateral recess narrowing, similar to previous. Foramina remain patent. L4-5: Remote interbody fusion. Prior right hemi laminectomy. No residual canal or foraminal stenosis. L5-S1: Retrolisthesis. Diffuse disc bulge with chronic reactive endplate changes. Moderate facet and ligament flavum hypertrophy. Remote right laminectomy. Central canal remains patent. Mild left with moderate right L5 foraminal stenosis. IMPRESSION: 1. New/interval L1 vertebral fracture with minimal central height loss without bony retropulsion. 2. Sequelae of interval vertebral augmentation at T12 with mildly progressive height loss and persistent marrow edema. 3. Chronic L2 and L3 compression fractures, stable. 4. Advanced multilevel disc and facet degeneration with kyphoscoliosis. 5. Moderate spinal stenosis at T12-L1 and L1-2, with more moderate to severe narrowing at L3-4. 6. Prior interbody fusion at L4-5. Electronically Signed   By: Jeannine Boga M.D.   On: 09/27/2017 02:55    EKG:   Assessment/Plan Principal Problem:   L1 vertebral fracture (HCC) Active Problems:   UTI (urinary tract infection)   Essential  hypertension   History of COPD   CKD (chronic kidney disease), stage III (HCC)   CAD, NATIVE VESSEL   Basal cell carcinoma   History of TIA (transient ischemic attack)   #1.intractable back pain/ L1 vertebral fracture. MRI reveals acute L1 fracture. History of same. provided Decadron in the emergency department as well as Flexeril and morphine. Orthopedics contacted per ED provider -Admit -supportive therapy -Robaxin -physical therapy -holding xarelto for now   #2. Urinary tract infection. Urinalysis consistent with infection. She is afebrile hemodynamically stableand nontoxic appearing. Rocephin initiated in the emergency department -continue Rocephin -Follow urine culture  #3. Hypertension. Controlled in the emergency department. Home medications include Norvasc, Lasix, Toprol-XL -continue home meds -monitor  4.chronic kidney disease stage III.creatinine 1.32 on admission. This appears close to her baseline. -Monitor intake and output -Recheck in the morning -Hold nephrotoxins  #5. History of TIA/stroke/A. Fib. Stable at baseline. Home meds include xarelto, BB.  -holding Xarelto for now -Resume Xarelto as indicated  #6. COPD. Not on home oxygen. Stable at baseline    DVT prophylaxis: scd  Code Status: full  Family Communication: none present  Disposition Plan: home  Consults called: [facility]  Admission status: obs    Dyanne Carrel M MD [facility]  If 7PM-7AM, please contact night-coverage [email or web address] Password [email or web address]  09/27/2017, 8:22 AM

## 2017-09-27 NOTE — ED Notes (Signed)
istat chem 8 not crossing over, I will key in [facility]

## 2017-09-28 DIAGNOSIS — M545 Low back pain: Secondary | ICD-10-CM | POA: Diagnosis not present

## 2017-09-28 DIAGNOSIS — E785 Hyperlipidemia, unspecified: Secondary | ICD-10-CM | POA: Diagnosis present

## 2017-09-28 DIAGNOSIS — I129 Hypertensive chronic kidney disease with stage 1 through stage 4 chronic kidney disease, or unspecified chronic kidney disease: Secondary | ICD-10-CM | POA: Diagnosis present

## 2017-09-28 DIAGNOSIS — B9629 Other Escherichia coli [E. coli] as the cause of diseases classified elsewhere: Secondary | ICD-10-CM | POA: Diagnosis not present

## 2017-09-28 DIAGNOSIS — N390 Urinary tract infection, site not specified: Secondary | ICD-10-CM | POA: Diagnosis not present

## 2017-09-28 DIAGNOSIS — Z96653 Presence of artificial knee joint, bilateral: Secondary | ICD-10-CM | POA: Diagnosis present

## 2017-09-28 DIAGNOSIS — R32 Unspecified urinary incontinence: Secondary | ICD-10-CM | POA: Diagnosis present

## 2017-09-28 DIAGNOSIS — I2510 Atherosclerotic heart disease of native coronary artery without angina pectoris: Secondary | ICD-10-CM | POA: Diagnosis present

## 2017-09-28 DIAGNOSIS — Z8679 Personal history of other diseases of the circulatory system: Secondary | ICD-10-CM | POA: Diagnosis not present

## 2017-09-28 DIAGNOSIS — Z7401 Bed confinement status: Secondary | ICD-10-CM | POA: Diagnosis not present

## 2017-09-28 DIAGNOSIS — Z4789 Encounter for other orthopedic aftercare: Secondary | ICD-10-CM | POA: Diagnosis not present

## 2017-09-28 DIAGNOSIS — G8929 Other chronic pain: Secondary | ICD-10-CM | POA: Diagnosis present

## 2017-09-28 DIAGNOSIS — I10 Essential (primary) hypertension: Secondary | ICD-10-CM | POA: Diagnosis not present

## 2017-09-28 DIAGNOSIS — J440 Chronic obstructive pulmonary disease with acute lower respiratory infection: Secondary | ICD-10-CM | POA: Diagnosis not present

## 2017-09-28 DIAGNOSIS — M4856XA Collapsed vertebra, not elsewhere classified, lumbar region, initial encounter for fracture: Secondary | ICD-10-CM | POA: Diagnosis present

## 2017-09-28 DIAGNOSIS — Z7901 Long term (current) use of anticoagulants: Secondary | ICD-10-CM | POA: Diagnosis not present

## 2017-09-28 DIAGNOSIS — B9620 Unspecified Escherichia coli [E. coli] as the cause of diseases classified elsewhere: Secondary | ICD-10-CM | POA: Diagnosis not present

## 2017-09-28 DIAGNOSIS — Z8709 Personal history of other diseases of the respiratory system: Secondary | ICD-10-CM | POA: Diagnosis not present

## 2017-09-28 DIAGNOSIS — Z8249 Family history of ischemic heart disease and other diseases of the circulatory system: Secondary | ICD-10-CM | POA: Diagnosis not present

## 2017-09-28 DIAGNOSIS — I252 Old myocardial infarction: Secondary | ICD-10-CM | POA: Diagnosis not present

## 2017-09-28 DIAGNOSIS — M6281 Muscle weakness (generalized): Secondary | ICD-10-CM | POA: Diagnosis not present

## 2017-09-28 DIAGNOSIS — M62830 Muscle spasm of back: Secondary | ICD-10-CM | POA: Diagnosis present

## 2017-09-28 DIAGNOSIS — Z981 Arthrodesis status: Secondary | ICD-10-CM | POA: Diagnosis not present

## 2017-09-28 DIAGNOSIS — Z85820 Personal history of malignant melanoma of skin: Secondary | ICD-10-CM | POA: Diagnosis not present

## 2017-09-28 DIAGNOSIS — M2550 Pain in unspecified joint: Secondary | ICD-10-CM | POA: Diagnosis not present

## 2017-09-28 DIAGNOSIS — N183 Chronic kidney disease, stage 3 (moderate): Secondary | ICD-10-CM | POA: Diagnosis not present

## 2017-09-28 DIAGNOSIS — S32010A Wedge compression fracture of first lumbar vertebra, initial encounter for closed fracture: Secondary | ICD-10-CM | POA: Diagnosis not present

## 2017-09-28 DIAGNOSIS — Z79899 Other long term (current) drug therapy: Secondary | ICD-10-CM | POA: Diagnosis not present

## 2017-09-28 DIAGNOSIS — M353 Polymyalgia rheumatica: Secondary | ICD-10-CM | POA: Diagnosis present

## 2017-09-28 DIAGNOSIS — R918 Other nonspecific abnormal finding of lung field: Secondary | ICD-10-CM | POA: Diagnosis not present

## 2017-09-28 DIAGNOSIS — Z90710 Acquired absence of both cervix and uterus: Secondary | ICD-10-CM | POA: Diagnosis not present

## 2017-09-28 DIAGNOSIS — J189 Pneumonia, unspecified organism: Secondary | ICD-10-CM | POA: Diagnosis not present

## 2017-09-28 DIAGNOSIS — S32009A Unspecified fracture of unspecified lumbar vertebra, initial encounter for closed fracture: Secondary | ICD-10-CM | POA: Diagnosis not present

## 2017-09-28 DIAGNOSIS — J449 Chronic obstructive pulmonary disease, unspecified: Secondary | ICD-10-CM | POA: Diagnosis present

## 2017-09-28 DIAGNOSIS — R2681 Unsteadiness on feet: Secondary | ICD-10-CM | POA: Diagnosis not present

## 2017-09-28 DIAGNOSIS — Z8673 Personal history of transient ischemic attack (TIA), and cerebral infarction without residual deficits: Secondary | ICD-10-CM | POA: Diagnosis not present

## 2017-09-28 DIAGNOSIS — Z888 Allergy status to other drugs, medicaments and biological substances status: Secondary | ICD-10-CM | POA: Diagnosis not present

## 2017-09-28 DIAGNOSIS — I480 Paroxysmal atrial fibrillation: Secondary | ICD-10-CM | POA: Diagnosis not present

## 2017-09-28 DIAGNOSIS — R509 Fever, unspecified: Secondary | ICD-10-CM | POA: Diagnosis not present

## 2017-09-28 DIAGNOSIS — S32019A Unspecified fracture of first lumbar vertebra, initial encounter for closed fracture: Secondary | ICD-10-CM | POA: Diagnosis not present

## 2017-09-28 LAB — CBC
HCT: 34.6 % — ABNORMAL LOW (ref 36.0–46.0)
Hemoglobin: 10.6 g/dL — ABNORMAL LOW (ref 12.0–15.0)
MCH: 28.8 pg (ref 26.0–34.0)
MCHC: 30.6 g/dL (ref 30.0–36.0)
MCV: 94 fL (ref 78.0–100.0)
PLATELETS: 351 10*3/uL (ref 150–400)
RBC: 3.68 MIL/uL — AB (ref 3.87–5.11)
RDW: 14.2 % (ref 11.5–15.5)
WBC: 7.4 10*3/uL (ref 4.0–10.5)

## 2017-09-28 LAB — BASIC METABOLIC PANEL
Anion gap: 9 (ref 5–15)
BUN: 27 mg/dL — ABNORMAL HIGH (ref 6–20)
CALCIUM: 8.5 mg/dL — AB (ref 8.9–10.3)
CHLORIDE: 104 mmol/L (ref 101–111)
CO2: 26 mmol/L (ref 22–32)
CREATININE: 1.54 mg/dL — AB (ref 0.44–1.00)
GFR calc non Af Amer: 30 mL/min — ABNORMAL LOW (ref >60)
GFR, EST AFRICAN AMERICAN: 35 mL/min — AB (ref >60)
Glucose, Bld: 136 mg/dL — ABNORMAL HIGH (ref 65–99)
Potassium: 4.3 mmol/L (ref 3.5–5.1)
SODIUM: 139 mmol/L (ref 135–145)

## 2017-09-28 MED ORDER — ENOXAPARIN SODIUM 80 MG/0.8ML ~~LOC~~ SOLN
1.0000 mg/kg | SUBCUTANEOUS | Status: AC
  Administered 2017-09-28 – 2017-09-29 (×2): 70 mg via SUBCUTANEOUS
  Filled 2017-09-28 (×2): qty 0.8

## 2017-09-28 MED ORDER — MORPHINE SULFATE (PF) 2 MG/ML IV SOLN
1.0000 mg | INTRAVENOUS | Status: DC | PRN
  Administered 2017-09-28 – 2017-10-01 (×10): 2 mg via INTRAVENOUS
  Administered 2017-10-01: 1 mg via INTRAVENOUS
  Administered 2017-10-01 – 2017-10-02 (×5): 2 mg via INTRAVENOUS
  Filled 2017-09-28 (×17): qty 1

## 2017-09-28 MED ORDER — PREDNISONE 5 MG PO TABS
5.0000 mg | ORAL_TABLET | Freq: Every day | ORAL | Status: DC
  Administered 2017-09-29 – 2017-10-02 (×4): 5 mg via ORAL
  Filled 2017-09-28 (×4): qty 1

## 2017-09-28 MED ORDER — HYDROMORPHONE HCL 2 MG/ML IJ SOLN
0.5000 mg | Freq: Once | INTRAMUSCULAR | Status: AC
  Administered 2017-09-28: 0.5 mg via INTRAVENOUS
  Filled 2017-09-28: qty 1

## 2017-09-28 NOTE — Progress Notes (Signed)
PROGRESS NOTE                                                                                                                                                                                                             Patient Demographics:    Brenda Hall, is a 84 y.o. female, DOB - August 03, 1930, DEY:[id number]  Admit date - 09/26/2017   Admitting Physician Florencia Reasons, MD  Outpatient Primary MD for the patient is Luking, Grace Bushy, MD  LOS - 0   Chief Complaint  Patient presents with  . Back Pain       Brief Narrative   84 y.o. female with medical history significant for hypertension, CAD, MI 2007, A. Fib, carotid stenosis, hyperlipidemia, TIA, CVA, severe osteoporosis, osteoarthritis, polymyalgia rheumatica and lumbar 2 compression fracture status post vertebroplasy in march, as well as T12 vertebral plasty last month presents to the emergency department with worsening lower back pain, work-up significant for new L1 fracture    Subjective:    Brenda Hall today has, No headache, No chest pain, No abdominal pain -she complains of lower back pain   a Principal Problem:   L1 vertebral fracture ([place]) Active Problems:   Essential hypertension   CAD, NATIVE VESSEL   History of COPD   Basal cell carcinoma   UTI (urinary tract infection)   CKD (chronic kidney disease), stage III (HCC)   History of TIA (transient ischemic attack)   PAF (paroxysmal atrial fibrillation) (HCC)   intractable back pain -Secondary to new acute L1 fracture, as showing an MRI, did receive IV Decadron in ED, continue with Flexeril, and PRN pain medication. -PT consulted -I have discussed with neuro IR Dr. Rosana Hoes work, plan for vertebroplasty this Tuesday, meanwhile keep holding Xarelto, and start on Lovenox, to be stopped 24 hours before surgery.  Urinary tract infection.  -Continue with IV Rocephin, follow urine cultures#3. Hypertension. Controlled in the  emergency department. Home medications include Norvasc, Lasix, Toprol-XL -continue home meds -monitor  chronic kidney disease stage III. -Creatinine at baseline, continue to monitor  History of TIA/stroke/A. Fib.  -Rate controlled, Xarelto on hold in anticipation for procedures, continue with beta-blockers for heart rate controlled, given elevated chads Vascor, will continue with Lovenox for now , and to hold tomorrow afternoon in anticipation for procedure on Tuesday .  COPD. Not on  home oxygen. Stable at baseline       Code Status : Full  Family Communication  : None at bedside  Disposition Plan  : PT consulted  Consults  : Discussed with neuro IR Dr.Devashwar via phone  Procedures  : None  DVT Prophylaxis  :  Lovenox  Lab Results  Component Value Date   PLT 351 09/28/2017    Antibiotics  :   Anti-infectives (From admission, onward)   Start     Dose/Rate Route Frequency Ordered Stop   09/28/17 0800  cefTRIAXone (ROCEPHIN) 1 g in sodium chloride 0.9 % 100 mL IVPB     1 g 200 mL/hr over 30 Minutes Intravenous Every 24 hours 09/27/17 0654     09/27/17 0545  cefTRIAXone (ROCEPHIN) 1 g in sodium chloride 0.9 % 100 mL IVPB     1 g 200 mL/hr over 30 Minutes Intravenous  Once 09/27/17 0531 09/27/17 0653        Objective:   Vitals:   09/27/17 1515 09/27/17 2117 09/28/17 0500 09/28/17 0519  BP: (!) 156/75 (!) 148/79  (!) 163/74  Pulse: 73 92  65  Resp: 17 17  18   Temp: 98 F (36.7 C) (!) 97.3 F (36.3 C)  98.1 F (36.7 C)  TempSrc: Oral Oral  Oral  SpO2: 93% 97%  96%  Weight:   71.9 kg (158 lb 8.2 oz)   Height:        Wt Readings from Last 3 Encounters:  09/28/17 71.9 kg (158 lb 8.2 oz)  09/05/17 70.3 kg (155 lb)  09/02/17 71.7 kg (158 lb)     Intake/Output Summary (Last 24 hours) at 09/28/2017 1200 Last data filed at 09/28/2017 0900 Gross per 24 hour  Intake 720 ml  Output -  Net 720 ml     Physical Exam  Awake Alert, Oriented X 3, No new F.N  deficits, Normal affect Symmetrical Chest wall movement, Good air movement bilaterally, CTAB RRR,No Gallops,Rubs or new Murmurs, No Parasternal Heave +ve B.Sounds, Abd Soft, No tenderness, No rebound - guarding or rigidity. No Cyanosis, Clubbing or edema, No new Rash or bruise , with significant lower back tenderness to palpation,    Data Review:    CBC Recent Labs  Lab 09/27/17 0349 09/28/17 0359  WBC 8.5 7.4  HGB 12.6 10.6*  HCT 41.0 34.6*  PLT 395 351  MCV 94.3 94.0  MCH 29.0 28.8  MCHC 30.7 30.6  RDW 14.3 14.2    Chemistries  Recent Labs  Lab 09/27/17 0520 09/28/17 0359  NA 140 139  K 4.4 4.3  CL 104 104  CO2 26 26  GLUCOSE 95 136*  BUN 17 27*  CREATININE 1.32* 1.54*  CALCIUM 9.2 8.5*   ------------------------------------------------------------------------------------------------------------------ No results for input(s): CHOL, HDL, LDLCALC, TRIG, CHOLHDL, LDLDIRECT in the last 72 hours.  No results found for: HGBA1C ------------------------------------------------------------------------------------------------------------------ No results for input(s): TSH, T4TOTAL, T3FREE, THYROIDAB in the last 72 hours.  Invalid input(s): FREET3 ------------------------------------------------------------------------------------------------------------------ No results for input(s): VITAMINB12, FOLATE, FERRITIN, TIBC, IRON, RETICCTPCT in the last 72 hours.  Coagulation profile No results for input(s): INR, PROTIME in the last 168 hours.  No results for input(s): DDIMER in the last 72 hours.  Cardiac Enzymes No results for input(s): CKMB, TROPONINI, MYOGLOBIN in the last 168 hours.  Invalid input(s): CK ------------------------------------------------------------------------------------------------------------------    Component Value Date/Time   BNP 440.0 (H) 02/12/2017 2014    Inpatient Medications  Scheduled Meds: . amLODipine  5 mg Oral Daily  .  atorvastatin  80 mg Oral QHS  . enoxaparin (LOVENOX) injection  1 mg/kg Subcutaneous Q12H  . estrogens (conjugated)  0.3 mg Oral QPM  . furosemide  20 mg Oral Daily  . gabapentin  100 mg Oral QHS  . metoprolol succinate  75 mg Oral QPM  . pantoprazole  40 mg Oral q morning - 10a   Continuous Infusions: . cefTRIAXone (ROCEPHIN)  IV 1 g (09/28/17 0900)   PRN Meds:.acetaminophen **OR** acetaminophen, fluticasone, methocarbamol, nitroGLYCERIN, ondansetron **OR** ondansetron (ZOFRAN) IV, senna-docusate  Micro Results No results found for this or any previous visit (from the past 240 hour(s)).  Radiology Reports Mr Lumbar Spine Wo Contrast  Result Date: 09/27/2017 CLINICAL DATA:  Initial evaluation for severe low back pain over past 2 days, recent vertebral augmentation at T12. EXAM: MRI LUMBAR SPINE WITHOUT CONTRAST TECHNIQUE: Multiplanar, multisequence MR imaging of the lumbar spine was performed. No intravenous contrast was administered. COMPARISON:  Prior MRI from 08/21/2017. FINDINGS: Segmentation: Normal segmentation. Lowest well-formed disc labeled the L5-S1 level. Same numbering system is employed as on previous exam. Alignment: Levoscoliosis with reversal of the normal lumbar lordosis. Stable anterolisthesis of L2 on L3 and L3 on L4, with trace retrolisthesis of L5 on S1. Vertebrae: Sequelae of interval vertebral augmentation at T12 for previously identified fracture. Increased height loss now measuring up to 40% centrally. Mild 5 mm retropulsion relatively stable. Persistent marrow edema without new fracture line. There is a new acute fracture extending through the superior endplate of L1 with abnormal marrow edema throughout the superior and mid aspect of the L1 vertebral body. Minimal central height loss without bony retropulsion. No underlying pathologic lesion. Chronic L2 fracture with sequelae of prior vertebral augmentation is unchanged. Chronic L3 fracture also stable. Bone marrow signal  intensity heterogeneous but within normal limits. Prominent reactive endplate changes at the L4-5 and L5-S1 interspaces. No worrisome osseous lesions. Conus medullaris and cauda equina: Conus extends to the L1-2 level. Conus and cauda equina appear normal. Paraspinal and other soft tissues: Mild edema within the paraspinous soft tissues adjacent to the T12 and L1 compression fractures. Chronic fatty atrophy noted within the lower posterior paraspinous musculature. Innumerable bilateral renal cysts noted, a few of which demonstrate T2 hypointensity with intermediate T1 signal, likely proteinaceous and/or hemorrhagic cyst. Disc levels: T12-L1: 5 mm bony retropulsion related to the T12 compression fracture with mild disc bulge. Mild facet hypertrophy. Moderate spinal stenosis without cord impingement. Mild bilateral foraminal narrowing. L1-2: Diffuse disc bulge with intervertebral disc space narrowing. Moderate facet hypertrophy, greater on the right. Resultant moderate spinal stenosis, stable. Neural foramina remain patent. L2-3: Mild disc bulge with flattening of the ventral thecal sac. Moderate facet hypertrophy. Mild spinal stenosis, stable. Neural foramina remain patent. L3-4: Diffuse degenerative disc bulge with disc desiccation and intervertebral disc space narrowing. Advanced bilateral facet degeneration with ligamentum flavum hypertrophy. Moderate to severe spinal stenosis with left greater than right lateral recess narrowing, similar to previous. Foramina remain patent. L4-5: Remote interbody fusion. Prior right hemi laminectomy. No residual canal or foraminal stenosis. L5-S1: Retrolisthesis. Diffuse disc bulge with chronic reactive endplate changes. Moderate facet and ligament flavum hypertrophy. Remote right laminectomy. Central canal remains patent. Mild left with moderate right L5 foraminal stenosis. IMPRESSION: 1. New/interval L1 vertebral fracture with minimal central height loss without bony  retropulsion. 2. Sequelae of interval vertebral augmentation at T12 with mildly progressive height loss and persistent marrow edema. 3. Chronic L2 and L3 compression fractures, stable. 4. Advanced multilevel disc and facet  degeneration with kyphoscoliosis. 5. Moderate spinal stenosis at T12-L1 and L1-2, with more moderate to severe narrowing at L3-4. 6. Prior interbody fusion at L4-5. Electronically Signed   By: Jeannine Boga M.D.   On: 09/27/2017 02:55   Ir Vertebroplasty Cerv/thor Bx Inc Uni/bil Inc/inject/imaging  Result Date: 09/08/2017 INDICATION: Severe thoracolumbar pain secondary to compression fracture at T12. EXAM: VERTEBROPLASTY T12 MEDICATIONS: As antibiotic prophylaxis, Ancef 2 g IV was ordered pre-procedure and administered intravenously within 1 hour of incision. ANESTHESIA/SEDATION: Moderate (conscious) sedation was employed during this procedure. A total of Versed 2 mg and Fentanyl 75 mcg was administered intravenously. Moderate Sedation Time: 19 minutes. The patient's level of consciousness and vital signs were monitored continuously by radiology nursing throughout the procedure under my direct supervision. FLUOROSCOPY TIME:  Fluoroscopy Time: 6 minutes 24 seconds (793 mGy) COMPLICATIONS: None immediate. TECHNIQUE: Informed written consent was obtained from the patient after a thorough discussion of the procedural risks, benefits and alternatives. All questions were addressed. Maximal Sterile Barrier Technique was utilized including caps, mask, sterile gowns, sterile gloves, sterile drape, hand hygiene and skin antiseptic. A timeout was performed prior to the initiation of the procedure. PROCEDURE: The patient was placed prone on the fluoroscopic table. Nasal oxygen was administered. Physiologic monitoring was performed throughout the duration of the procedure. The skin overlying the thoracolumbar region was prepped and draped in the usual sterile fashion. The T12 vertebral body was  identified and the right pedicle was infiltrated with 0.25% Bupivacaine. This was then followed by the advancement of a 13-gauge Cook needle through the right pedicle into the anterior one-third at T12. A gentle contrast injection demonstrated a trabecular pattern of contrast. At this time, methylmethacrylate mixture was reconstituted. Under biplane intermittent fluoroscopy, the methylmethacrylate was then injected into the T12 vertebral body with filling of the vertebral body. No extravasation was noted into the disk spaces or posteriorly into the spinal canal. No epidural venous contamination was seen. The needle was then removed. Hemostasis was achieved at the skin entry site. There were no acute complications. Patient tolerated the procedure well. The patient was observed for 3 hours and discharged in good condition. IMPRESSION: 1. Status post vertebral body augmentation for painful compression fracture at T12 using vertebroplasty technique. Electronically Signed   By: Luanne Bras M.D.   On: 09/05/2017 14:41      Phillips Climes M.D on 09/28/2017 at 12:00 PM  Between 7am to 7pm - Pager - [phone]  After 7pm go to [email or web address] - password [facility]  [facility] -  Office  [phone]

## 2017-09-28 NOTE — Progress Notes (Signed)
PT Cancellation Note  Patient Details Name: Brenda Hall MRN: [id number] DOB: 1930/08/26   Cancelled Treatment:    Reason Eval/Treat Not Completed: Other (comment). Per RN pt up ad lib walking to/from bathroom with rolator without difficulty. Per RN pt planned for OR tomorrow 6/10. No orthopedic consult note in chart currently. PT to hold evaluation until post surgery as pt is up in room independently with rolator.  Kittie Plater, PT, DPT Pager #: [phone] Office #: [phone]    [place] 09/28/2017, 7:53 AM

## 2017-09-29 ENCOUNTER — Inpatient Hospital Stay (HOSPITAL_COMMUNITY): Payer: Medicare Other

## 2017-09-29 DIAGNOSIS — R509 Fever, unspecified: Secondary | ICD-10-CM

## 2017-09-29 LAB — POCT I-STAT, CHEM 8
BUN: 23 mg/dL — ABNORMAL HIGH (ref 6–20)
CALCIUM ION: 1.21 mmol/L (ref 1.15–1.40)
Chloride: 104 mmol/L (ref 101–111)
Creatinine, Ser: 1.2 mg/dL — ABNORMAL HIGH (ref 0.44–1.00)
GLUCOSE: 101 mg/dL — AB (ref 65–99)
HCT: 41 % (ref 36.0–46.0)
HEMOGLOBIN: 13.9 g/dL (ref 12.0–15.0)
Potassium: 4.5 mmol/L (ref 3.5–5.1)
SODIUM: 140 mmol/L (ref 135–145)
TCO2: 28 mmol/L (ref 22–32)

## 2017-09-29 LAB — URINE CULTURE: Special Requests: NORMAL

## 2017-09-29 LAB — CBC
HCT: 37.3 % (ref 36.0–46.0)
Hemoglobin: 11.3 g/dL — ABNORMAL LOW (ref 12.0–15.0)
MCH: 28.5 pg (ref 26.0–34.0)
MCHC: 30.3 g/dL (ref 30.0–36.0)
MCV: 94 fL (ref 78.0–100.0)
PLATELETS: 414 10*3/uL — AB (ref 150–400)
RBC: 3.97 MIL/uL (ref 3.87–5.11)
RDW: 14.3 % (ref 11.5–15.5)
WBC: 8.3 10*3/uL (ref 4.0–10.5)

## 2017-09-29 LAB — BASIC METABOLIC PANEL
ANION GAP: 11 (ref 5–15)
BUN: 29 mg/dL — ABNORMAL HIGH (ref 6–20)
CALCIUM: 8.5 mg/dL — AB (ref 8.9–10.3)
CO2: 26 mmol/L (ref 22–32)
Chloride: 105 mmol/L (ref 101–111)
Creatinine, Ser: 1.44 mg/dL — ABNORMAL HIGH (ref 0.44–1.00)
GFR calc Af Amer: 37 mL/min — ABNORMAL LOW (ref >60)
GFR, EST NON AFRICAN AMERICAN: 32 mL/min — AB (ref >60)
GLUCOSE: 92 mg/dL (ref 65–99)
POTASSIUM: 4.2 mmol/L (ref 3.5–5.1)
SODIUM: 142 mmol/L (ref 135–145)

## 2017-09-29 LAB — PROCALCITONIN

## 2017-09-29 NOTE — Progress Notes (Addendum)
PROGRESS NOTE                                                                                                                                                                                                             Patient Demographics:    Brenda Hall, is a 84 y.o. female, DOB - 1931/01/05, JJK:[id number]  Admit date - 09/26/2017   Admitting Physician Florencia Reasons, MD  Outpatient Primary MD for the patient is Luking, Grace Bushy, MD  LOS - 1   Chief Complaint  Patient presents with  . Back Pain       Brief Narrative   84 y.o. female with medical history significant for hypertension, CAD, MI 2007, A. Fib, carotid stenosis, hyperlipidemia, TIA, CVA, severe osteoporosis, osteoarthritis, polymyalgia rheumatica and lumbar 2 compression fracture status post vertebroplasy in march, as well as T12 vertebral plasty last month presents to the emergency department with worsening lower back pain, work-up significant for new L1 fracture and UTI.    Subjective:    Brenda Hall today has, No headache, No chest pain, No abdominal pain -complains of lower back pain, he had fever 102.4 overnight, she was not aware of it.  a Principal Problem:   L1 vertebral fracture (HCC) Active Problems:   Essential hypertension   CAD, NATIVE VESSEL   History of COPD   Basal cell carcinoma   UTI (urinary tract infection)   CKD (chronic kidney disease), stage III (HCC)   History of TIA (transient ischemic attack)   PAF (paroxysmal atrial fibrillation) (HCC)   intractable back pain -Secondary to new acute L1 fracture, as showing an MRI, did receive IV Decadron in ED, continue with Flexeril, and PRN pain medication. -PT blood pending -I have discussed with neuro IR Dr. August Luz plan for vertebroplasty this Tuesday, meanwhile keep holding Xarelto, and start on Lovenox, to be stopped 24 hours before surgery.  Urinary tract infection.  -Continue with IV Rocephin,  culture growing pansensitive E. coli, continue with IV Rocephin. -Significant fever 102.4 overnight, no CVA tenderness, nontoxic-appearing, I will obtain blood cultures, and repeat two-view chest x-ray.  I well trend procalcitonin, I have encouraged her to use incentive spirometry as well   Hypertension.  -Acceptable, continue with home medication   chronic kidney disease stage III. -Creatinine at baseline, continue to monitor  History of TIA/stroke/A. Fib.  -  Rate controlled, Xarelto on hold in anticipation for procedures, continue with beta-blockers for heart rate controlled, given elevated CHAds 2 VAsc , he was kept on Lovenox since yesterday, will hold after afternoon dose, to remain off anticoagulation for 24 hours for anticipated procedure tomorrow .  COPD. - Not on home oxygen. Stable at baseline, encouraged to use incentive spirometry today  Patient with recent right lower eyelid surgery resection of basal cell carcinoma, continue with home meds and follow with ophthalmology as an outpatient.     Code Status : Full  Family Communication  : None at bedside  Disposition Plan  : PT consulted  Consults  : Discussed with neuro IR Dr.Devashwar via phone  Procedures  : None  DVT Prophylaxis  :  Lovenox  Lab Results  Component Value Date   PLT 414 (H) 09/29/2017    Antibiotics  :   Anti-infectives (From admission, onward)   Start     Dose/Rate Route Frequency Ordered Stop   09/28/17 0800  cefTRIAXone (ROCEPHIN) 1 g in sodium chloride 0.9 % 100 mL IVPB     1 g 200 mL/hr over 30 Minutes Intravenous Every 24 hours 09/27/17 0654     09/27/17 0545  cefTRIAXone (ROCEPHIN) 1 g in sodium chloride 0.9 % 100 mL IVPB     1 g 200 mL/hr over 30 Minutes Intravenous  Once 09/27/17 0531 09/27/17 0653        Objective:   Vitals:   09/28/17 2200 09/29/17 0400 09/29/17 0613 09/29/17 0800  BP: 119/68 (!) 158/69    Pulse: 80 94    Resp: 18 20    Temp: 97.6 F (36.4 C) (!) 102.2  F (39 C)  (!) 97.3 F (36.3 C)  TempSrc: Oral Oral  Oral  SpO2: 96% 96%    Weight:   72.4 kg (159 lb 9.8 oz)   Height:        Wt Readings from Last 3 Encounters:  09/29/17 72.4 kg (159 lb 9.8 oz)  09/05/17 70.3 kg (155 lb)  09/02/17 71.7 kg (158 lb)     Intake/Output Summary (Last 24 hours) at 09/29/2017 1425 Last data filed at 09/29/2017 0900 Gross per 24 hour  Intake 580 ml  Output -  Net 580 ml     Physical Exam  Awake alert x3, no focal deficits, normal affect , mild bruise at right lower eyelid from recent procedure, no discharge or bleed Good air entry bilaterally, no wheezing rales rhonchi  Rate and rhythm, no rubs murmurs gallops  Abdomen soft, nontender, nondistended, bowel sounds present, no CVA tenderness, has lower back left side tenderness to palpation  Extremities with no edema, clubbing or cyanosis, no weakness .    Data Review:    CBC Recent Labs  Lab 09/27/17 0349 09/27/17 0358 09/28/17 0359 09/29/17 0523  WBC 8.5  --  7.4 8.3  HGB 12.6 13.9 10.6* 11.3*  HCT 41.0 41.0 34.6* 37.3  PLT 395  --  351 414*  MCV 94.3  --  94.0 94.0  MCH 29.0  --  28.8 28.5  MCHC 30.7  --  30.6 30.3  RDW 14.3  --  14.2 14.3    Chemistries  Recent Labs  Lab 09/27/17 0358 09/27/17 0520 09/28/17 0359 09/29/17 0523  NA 140 140 139 142  K 4.5 4.4 4.3 4.2  CL 104 104 104 105  CO2  --  26 26 26   GLUCOSE 101* 95 136* 92  BUN 23* 17 27* 29*  CREATININE 1.20* 1.32* 1.54* 1.44*  CALCIUM  --  9.2 8.5* 8.5*   ------------------------------------------------------------------------------------------------------------------ No results for input(s): CHOL, HDL, LDLCALC, TRIG, CHOLHDL, LDLDIRECT in the last 72 hours.  No results found for: HGBA1C ------------------------------------------------------------------------------------------------------------------ No results for input(s): TSH, T4TOTAL, T3FREE, THYROIDAB in the last 72 hours.  Invalid input(s):  FREET3 ------------------------------------------------------------------------------------------------------------------ No results for input(s): VITAMINB12, FOLATE, FERRITIN, TIBC, IRON, RETICCTPCT in the last 72 hours.  Coagulation profile No results for input(s): INR, PROTIME in the last 168 hours.  No results for input(s): DDIMER in the last 72 hours.  Cardiac Enzymes No results for input(s): CKMB, TROPONINI, MYOGLOBIN in the last 168 hours.  Invalid input(s): CK ------------------------------------------------------------------------------------------------------------------    Component Value Date/Time   BNP 440.0 (H) 02/12/2017 2014    Inpatient Medications  Scheduled Meds: . amLODipine  5 mg Oral Daily  . atorvastatin  80 mg Oral QHS  . enoxaparin (LOVENOX) injection  1 mg/kg Subcutaneous Q24H  . estrogens (conjugated)  0.3 mg Oral QPM  . gabapentin  100 mg Oral QHS  . metoprolol succinate  75 mg Oral QPM  . pantoprazole  40 mg Oral q morning - 10a  . predniSONE  5 mg Oral Q breakfast   Continuous Infusions: . cefTRIAXone (ROCEPHIN)  IV Stopped (09/29/17 0912)   PRN Meds:.acetaminophen **OR** acetaminophen, fluticasone, methocarbamol, morphine injection, nitroGLYCERIN, ondansetron **OR** ondansetron (ZOFRAN) IV, senna-docusate  Micro Results Recent Results (from the past 240 hour(s))  Urine culture     Status: Abnormal   Collection Time: 09/27/17  5:32 AM  Result Value Ref Range Status   Specimen Description URINE, CLEAN CATCH  Final   Special Requests   Final    Normal Performed at [facility], [place] [address], [place], [place] [address]    Culture >=100,000 COLONIES/mL ESCHERICHIA COLI (A)  Final   Report Status 09/29/2017 FINAL  Final   Organism ID, Bacteria ESCHERICHIA COLI (A)  Final      Susceptibility   Escherichia coli - MIC*    AMPICILLIN <=2 SENSITIVE Sensitive     CEFAZOLIN <=4 SENSITIVE Sensitive     CEFTRIAXONE <=1 SENSITIVE Sensitive      CIPROFLOXACIN <=0.25 SENSITIVE Sensitive     GENTAMICIN <=1 SENSITIVE Sensitive     IMIPENEM <=0.25 SENSITIVE Sensitive     NITROFURANTOIN <=16 SENSITIVE Sensitive     TRIMETH/SULFA <=20 SENSITIVE Sensitive     AMPICILLIN/SULBACTAM <=2 SENSITIVE Sensitive     PIP/TAZO <=4 SENSITIVE Sensitive     Extended ESBL NEGATIVE Sensitive     * >=100,000 COLONIES/mL ESCHERICHIA COLI    Radiology Reports Mr Lumbar Spine Wo Contrast  Result Date: 09/27/2017 CLINICAL DATA:  Initial evaluation for severe low back pain over past 2 days, recent vertebral augmentation at T12. EXAM: MRI LUMBAR SPINE WITHOUT CONTRAST TECHNIQUE: Multiplanar, multisequence MR imaging of the lumbar spine was performed. No intravenous contrast was administered. COMPARISON:  Prior MRI from 08/21/2017. FINDINGS: Segmentation: Normal segmentation. Lowest well-formed disc labeled the L5-S1 level. Same numbering system is employed as on previous exam. Alignment: Levoscoliosis with reversal of the normal lumbar lordosis. Stable anterolisthesis of L2 on L3 and L3 on L4, with trace retrolisthesis of L5 on S1. Vertebrae: Sequelae of interval vertebral augmentation at T12 for previously identified fracture. Increased height loss now measuring up to 40% centrally. Mild 5 mm retropulsion relatively stable. Persistent marrow edema without new fracture line. There is a new acute fracture extending through the superior endplate of L1 with abnormal marrow edema  throughout the superior and mid aspect of the L1 vertebral body. Minimal central height loss without bony retropulsion. No underlying pathologic lesion. Chronic L2 fracture with sequelae of prior vertebral augmentation is unchanged. Chronic L3 fracture also stable. Bone marrow signal intensity heterogeneous but within normal limits. Prominent reactive endplate changes at the L4-5 and L5-S1 interspaces. No worrisome osseous lesions. Conus medullaris and cauda equina: Conus extends to the L1-2 level.  Conus and cauda equina appear normal. Paraspinal and other soft tissues: Mild edema within the paraspinous soft tissues adjacent to the T12 and L1 compression fractures. Chronic fatty atrophy noted within the lower posterior paraspinous musculature. Innumerable bilateral renal cysts noted, a few of which demonstrate T2 hypointensity with intermediate T1 signal, likely proteinaceous and/or hemorrhagic cyst. Disc levels: T12-L1: 5 mm bony retropulsion related to the T12 compression fracture with mild disc bulge. Mild facet hypertrophy. Moderate spinal stenosis without cord impingement. Mild bilateral foraminal narrowing. L1-2: Diffuse disc bulge with intervertebral disc space narrowing. Moderate facet hypertrophy, greater on the right. Resultant moderate spinal stenosis, stable. Neural foramina remain patent. L2-3: Mild disc bulge with flattening of the ventral thecal sac. Moderate facet hypertrophy. Mild spinal stenosis, stable. Neural foramina remain patent. L3-4: Diffuse degenerative disc bulge with disc desiccation and intervertebral disc space narrowing. Advanced bilateral facet degeneration with ligamentum flavum hypertrophy. Moderate to severe spinal stenosis with left greater than right lateral recess narrowing, similar to previous. Foramina remain patent. L4-5: Remote interbody fusion. Prior right hemi laminectomy. No residual canal or foraminal stenosis. L5-S1: Retrolisthesis. Diffuse disc bulge with chronic reactive endplate changes. Moderate facet and ligament flavum hypertrophy. Remote right laminectomy. Central canal remains patent. Mild left with moderate right L5 foraminal stenosis. IMPRESSION: 1. New/interval L1 vertebral fracture with minimal central height loss without bony retropulsion. 2. Sequelae of interval vertebral augmentation at T12 with mildly progressive height loss and persistent marrow edema. 3. Chronic L2 and L3 compression fractures, stable. 4. Advanced multilevel disc and facet  degeneration with kyphoscoliosis. 5. Moderate spinal stenosis at T12-L1 and L1-2, with more moderate to severe narrowing at L3-4. 6. Prior interbody fusion at L4-5. Electronically Signed   By: Jeannine Boga M.D.   On: 09/27/2017 02:55   Ir Vertebroplasty Cerv/thor Bx Inc Uni/bil Inc/inject/imaging  Result Date: 09/08/2017 INDICATION: Severe thoracolumbar pain secondary to compression fracture at T12. EXAM: VERTEBROPLASTY T12 MEDICATIONS: As antibiotic prophylaxis, Ancef 2 g IV was ordered pre-procedure and administered intravenously within 1 hour of incision. ANESTHESIA/SEDATION: Moderate (conscious) sedation was employed during this procedure. A total of Versed 2 mg and Fentanyl 75 mcg was administered intravenously. Moderate Sedation Time: 19 minutes. The patient's level of consciousness and vital signs were monitored continuously by radiology nursing throughout the procedure under my direct supervision. FLUOROSCOPY TIME:  Fluoroscopy Time: 6 minutes 24 seconds (409 mGy) COMPLICATIONS: None immediate. TECHNIQUE: Informed written consent was obtained from the patient after a thorough discussion of the procedural risks, benefits and alternatives. All questions were addressed. Maximal Sterile Barrier Technique was utilized including caps, mask, sterile gowns, sterile gloves, sterile drape, hand hygiene and skin antiseptic. A timeout was performed prior to the initiation of the procedure. PROCEDURE: The patient was placed prone on the fluoroscopic table. Nasal oxygen was administered. Physiologic monitoring was performed throughout the duration of the procedure. The skin overlying the thoracolumbar region was prepped and draped in the usual sterile fashion. The T12 vertebral body was identified and the right pedicle was infiltrated with 0.25% Bupivacaine. This was then followed by the advancement  of a 13-gauge Cook needle through the right pedicle into the anterior one-third at T12. A gentle contrast  injection demonstrated a trabecular pattern of contrast. At this time, methylmethacrylate mixture was reconstituted. Under biplane intermittent fluoroscopy, the methylmethacrylate was then injected into the T12 vertebral body with filling of the vertebral body. No extravasation was noted into the disk spaces or posteriorly into the spinal canal. No epidural venous contamination was seen. The needle was then removed. Hemostasis was achieved at the skin entry site. There were no acute complications. Patient tolerated the procedure well. The patient was observed for 3 hours and discharged in good condition. IMPRESSION: 1. Status post vertebral body augmentation for painful compression fracture at T12 using vertebroplasty technique. Electronically Signed   By: Luanne Bras M.D.   On: 09/05/2017 14:41      Phillips Climes M.D on 09/29/2017 at 2:25 PM  Between 7am to 7pm - Pager - [phone]  After 7pm go to [email or web address] - password [email or web address]  [facility] -  Office  [phone]

## 2017-09-29 NOTE — Consult Note (Signed)
Chief Complaint: Patient was seen in consultation today for lumbar 1 compression fracture.  Referring Physician(s): Elgergawy, Silver Huguenin  Supervising Physician: Corrie Mckusick  Patient Status: [facility] [place] - In-pt  History of Present Illness: Brenda Hall is a 84 y.o. female with a past medical history of hypertension, CAD, MI 2007, afib, carotid stenosis, hyperlipidemia, TIA, CVA, severe osteoporosis, osteoarthritis, polymyalgia rheumatica, and lumbar 2 compression fracture s/p vertebroplasty 06/2011 by Dr. Hal Neer. She underwent a thoracic 12 vertebroplasty 09/05/2017 with Dr. Estanislado Pandy. She presented in consult with Dr. Estanislado Pandy 09/26/2017 who sent her for MRI. She was unable to get on the table due to extreme pain, so was sent to ED for evaluation. She was then admitted for pain control.  MRI lumbar spine 09/27/2017: 1. New/interval L1 vertebral fracture with minimal central height loss without bony retropulsion. 2. Sequelae of interval vertebral augmentation at T12 with mildly progressive height loss and persistent marrow edema. 3. Chronic L2 and L3 compression fractures, stable. 4. Advanced multilevel disc and facet degeneration with kyphoscoliosis. 5. Moderate spinal stenosis at T12-L1 and L1-2, with more moderate to severe narrowing at L3-4. 6. Prior interbody fusion at L4-5.  IR requested by Dr. Waldron Labs for possible image-guided lumbar 1 kyphoplasty/vertebroplasty. Patient awake and alert laying in bed. Complains of lower back pain, currently rated 5/10. States pain is rated 10/10 when it is at its worse. States she had a back spasm that woke her up at 05:00 today. Denies chills, chest pain, dyspnea, abdominal pain, dizziness, dysuria, urinary frequency, numbness/tingling down legs, or bowel incontinence. States she has had bladder incontinence for years, which is stable at this time.   Past Medical History:  Diagnosis Date  . Anemia   . CAD in native artery    a. NSTEMI 12/2005  s/p BMS to Cx, prior normal LVEF.  Marland Kitchen Cancer ([place]) 1988 and  2009   BCC nose and  Melanoma back  . Cancer of skin Jan. 2014   [facility]head [facility] Nose  . Carotid stenosis    mild  . Chronic back pain   . CKD (chronic kidney disease), stage III ([place])   . Coronary atherosclerosis of unspecified type of vessel, native or graft   . DJD (degenerative joint disease) of knee   . History of TIA (transient ischemic attack)   . HTN (hypertension)   . IBS (irritable bowel syndrome)   . Myocardial infarction [facility])    5 yrs ago     dr wall  cardiac  . Osteoporosis   . Other diseases of lung, not elsewhere classified    lung nodule  . Polymyalgia rheumatica ([place])   . Shingles 01/2016-02/2016  . Stroke [facility])     Past Surgical History:  Procedure Laterality Date  . ABDOMINAL HYSTERECTOMY  1977   partial  . BACK SURGERY  1982  . CARDIAC CATHETERIZATION    . COLONOSCOPY N/A 01/27/2013   Procedure: COLONOSCOPY;  Surgeon: Rogene Houston, MD;  Location: [facility];  Service: Endoscopy;  Laterality: N/A;  1030  . EYE SURGERY Bilateral 04/2017  . IR VERTEBROPLASTY CERV/THOR BX INC UNI/BIL INC/INJECT/IMAGING  09/05/2017  . JOINT REPLACEMENT  2001 and  2011   6 knee surgeries   bil replacement  . KNEE ARTHROSCOPY    . LUMBAR LAMINECTOMY    . PR VEIN BYPASS GRAFT,AORTO-FEM-POP  2010  . removal of melanoma from back  2007  . SPINE SURGERY  2013   Broken back: fusion surgery  . tonisllectomy  1944  . total knee anthroplasty    . VERTEBROPLASTY  07/11/2011   Procedure: VERTEBROPLASTY;  Surgeon: Faythe Ghee, MD;  Location: [place] NEURO ORS;  Service: Neurosurgery;  Laterality: N/A;  Vertebroplasty of Lumbar Two    Allergies: Epinephrine; Clopidogrel bisulfate; and Oxycodone  Medications: Prior to Admission medications   Medication Sig Start Date End Date Taking? Authorizing Provider  acetaminophen (TYLENOL) 500 MG tablet Take 500 mg by mouth every 6 (six) hours as needed for mild pain.   Yes  [provider]  amLODipine (NORVASC) 5 MG tablet Take 5 mg by mouth daily.   Yes [provider]  aspirin 81 MG tablet Take 81 mg by mouth daily.   Yes [provider]  atorvastatin (LIPITOR) 80 MG tablet Take 1 tablet (80 mg total) by mouth at bedtime. 05/29/17  Yes [name], MD  Calcium Carbonate-Vit D-Min (CALCIUM 1200 PO) Take 1 tablet by mouth at bedtime.    Yes [provider]  estrogens, conjugated, (PREMARIN) 0.3 MG tablet Take 1 tablet (0.3 mg total) by mouth every evening. 02/14/17  Yes Johnson, Clanford L, MD  fluticasone (FLONASE) 50 MCG/ACT nasal spray USE TWO SPRAYS IN EACH NOSTRIL ONCE DAILY Patient taking differently: Place 1 spray into both nostrils daily as needed for allergies. USE TWO SPRAYS IN EACH NOSTRIL ONCE DAILY 12/03/16  Yes [name], MD  furosemide (LASIX) 20 MG tablet Take 20 mg DAILY AS NEEDED FOR SWELLING. Patient taking differently: Take 20 mg by mouth daily as needed for fluid. Take 20 mg DAILY AS NEEDED FOR SWELLING. 09/02/17  Yes BranchAlphonse Guild, MD  loperamide (IMODIUM) 2 MG capsule Take 2 mg by mouth daily as needed for diarrhea or loose stools.    Yes [provider]  methocarbamol (ROBAXIN) 500 MG tablet Take 500 mg by mouth every 8 (eight) hours as needed for muscle spasms.   Yes [provider]  metoprolol succinate (TOPROL-XL) 50 MG 24 hr tablet Take 1 and 1/2 Tablets At Night Patient taking differently: Take 75 mg by mouth every evening. Take 1 and 1/2 Tablets At Night 04/17/17  Yes Lendon Colonel, NP  nitroGLYCERIN (NITROSTAT) 0.4 MG SL tablet Place 1 tablet (0.4 mg total) under the tongue every 5 (five) minutes as needed for chest pain. 12/03/16  Yes [name], MD  pantoprazole (PROTONIX) 40 MG tablet TAKE 1 TABLET EVERY MORNING 04/21/17  Yes Deveshwar, Abel Presto, MD  predniSONE (DELTASONE) 5 MG tablet TAKE 1 TABLET BY MOUTH DAILY WITH BREAKFAST(TAPER BY 1 MG EVERY  MONTH) Patient taking differently: TAKE 6 MG TABLET BY MOUTH DAILY WITH BREAKFAST(TAPER BY 1 MG EVERY MONTH UNTIL COMPLETELY OFF) 09/18/17  Yes Deveshwar, Abel Presto, MD  Rivaroxaban (XARELTO) 15 MG TABS tablet TAKE ONE TABLET (15MG  TOTAL) BY MOUTH DAILY WITH SUPPER 04/18/17  Yes Branch, Alphonse Guild, MD  Zinc 50 MG CAPS Take 50 mg by mouth daily.   Yes [provider]  gabapentin (NEURONTIN) 100 MG capsule Take 1 capsule (100 mg total) by mouth at bedtime. Patient not taking: Reported on 09/27/2017 08/27/17   Jessy Oto, MD     Family History  Problem Relation Age of Onset  . Heart disease Father   . Hypertension Father   . Heart attack Father   . Cancer Mother        bladder   . Hypertension Daughter   . Atrial fibrillation Daughter   . Hypertension Son   . Colon cancer  Neg Hx     Social History   Socioeconomic History  . Marital status: Widowed    Spouse name: Not on file  . Number of children: Not on file  . Years of education: Not on file  . Highest education level: Not on file  Occupational History  . Not on file  Social Needs  . Financial resource strain: Not on file  . Food insecurity:    Worry: Not on file    Inability: Not on file  . Transportation needs:    Medical: Not on file    Non-medical: Not on file  Tobacco Use  . Smoking status: Never Smoker  . Smokeless tobacco: Never Used  . Tobacco comment: tobacco use - no  Substance and Sexual Activity  . Alcohol use: Yes    Alcohol/week: 0.0 oz    Comment: occasionally a glass of wine or margarita per pt  . Drug use: No  . Sexual activity: Not on file  Lifestyle  . Physical activity:    Days per week: Not on file    Minutes per session: Not on file  . Stress: Not on file  Relationships  . Social connections:    Talks on phone: Not on file    Gets together: Not on file    Attends religious service: Not on file    Active member of club or organization: Not on file    Attends meetings of clubs or  organizations: Not on file    Relationship status: Not on file  Other Topics Concern  . Not on file  Social History Narrative   Married, retired. 3 children. Regular exercise -yes; hobbies = swimming.     Review of Systems: A 12 point ROS discussed and pertinent positives are indicated in the HPI above.  All other systems are negative.  Review of Systems  Constitutional: Negative for chills.  Respiratory: Negative for shortness of breath and wheezing.   Cardiovascular: Negative for chest pain and palpitations.  Gastrointestinal: Negative for abdominal pain.       Negative for bowel incontinence.  Genitourinary:       Positive for bladder incontinence.  Musculoskeletal: Positive for back pain.  Neurological: Negative for dizziness and numbness.    Vital Signs: BP (!) 158/69 (BP Location: Right Arm)   Pulse 94   Temp (!) 97.3 F (36.3 C) (Oral)   Resp 20   Ht 5\' 3"  (1.6 m)   Wt 159 lb 9.8 oz (72.4 kg)   SpO2 96%   BMI 28.27 kg/m   Physical Exam  Constitutional: She is oriented to person, place, and time. She appears well-developed and well-nourished. No distress.  Cardiovascular: Normal rate, regular rhythm and normal heart sounds.  No murmur heard. Pulmonary/Chest: Effort normal and breath sounds normal. No respiratory distress. She has no wheezes.  Musculoskeletal:  Moderate tenderness of midline spine at approximate level of lumbar 1.  Neurological: She is alert and oriented to person, place, and time.  Skin: Skin is warm and dry.  Psychiatric: She has a normal mood and affect. Her behavior is normal. Judgment and thought content normal.  Nursing note and vitals reviewed.    MD Evaluation Airway: WNL Heart: WNL Abdomen: WNL Chest/ Lungs: WNL ASA  Classification: 2 Mallampati/Airway Score: One   Imaging: Mr Lumbar Spine Wo Contrast  Result Date: 09/27/2017 CLINICAL DATA:  Initial evaluation for severe low back pain over past 2 days, recent vertebral  augmentation at T12. EXAM: MRI LUMBAR SPINE  WITHOUT CONTRAST TECHNIQUE: Multiplanar, multisequence MR imaging of the lumbar spine was performed. No intravenous contrast was administered. COMPARISON:  Prior MRI from 08/21/2017. FINDINGS: Segmentation: Normal segmentation. Lowest well-formed disc labeled the L5-S1 level. Same numbering system is employed as on previous exam. Alignment: Levoscoliosis with reversal of the normal lumbar lordosis. Stable anterolisthesis of L2 on L3 and L3 on L4, with trace retrolisthesis of L5 on S1. Vertebrae: Sequelae of interval vertebral augmentation at T12 for previously identified fracture. Increased height loss now measuring up to 40% centrally. Mild 5 mm retropulsion relatively stable. Persistent marrow edema without new fracture line. There is a new acute fracture extending through the superior endplate of L1 with abnormal marrow edema throughout the superior and mid aspect of the L1 vertebral body. Minimal central height loss without bony retropulsion. No underlying pathologic lesion. Chronic L2 fracture with sequelae of prior vertebral augmentation is unchanged. Chronic L3 fracture also stable. Bone marrow signal intensity heterogeneous but within normal limits. Prominent reactive endplate changes at the L4-5 and L5-S1 interspaces. No worrisome osseous lesions. Conus medullaris and cauda equina: Conus extends to the L1-2 level. Conus and cauda equina appear normal. Paraspinal and other soft tissues: Mild edema within the paraspinous soft tissues adjacent to the T12 and L1 compression fractures. Chronic fatty atrophy noted within the lower posterior paraspinous musculature. Innumerable bilateral renal cysts noted, a few of which demonstrate T2 hypointensity with intermediate T1 signal, likely proteinaceous and/or hemorrhagic cyst. Disc levels: T12-L1: 5 mm bony retropulsion related to the T12 compression fracture with mild disc bulge. Mild facet hypertrophy. Moderate spinal  stenosis without cord impingement. Mild bilateral foraminal narrowing. L1-2: Diffuse disc bulge with intervertebral disc space narrowing. Moderate facet hypertrophy, greater on the right. Resultant moderate spinal stenosis, stable. Neural foramina remain patent. L2-3: Mild disc bulge with flattening of the ventral thecal sac. Moderate facet hypertrophy. Mild spinal stenosis, stable. Neural foramina remain patent. L3-4: Diffuse degenerative disc bulge with disc desiccation and intervertebral disc space narrowing. Advanced bilateral facet degeneration with ligamentum flavum hypertrophy. Moderate to severe spinal stenosis with left greater than right lateral recess narrowing, similar to previous. Foramina remain patent. L4-5: Remote interbody fusion. Prior right hemi laminectomy. No residual canal or foraminal stenosis. L5-S1: Retrolisthesis. Diffuse disc bulge with chronic reactive endplate changes. Moderate facet and ligament flavum hypertrophy. Remote right laminectomy. Central canal remains patent. Mild left with moderate right L5 foraminal stenosis. IMPRESSION: 1. New/interval L1 vertebral fracture with minimal central height loss without bony retropulsion. 2. Sequelae of interval vertebral augmentation at T12 with mildly progressive height loss and persistent marrow edema. 3. Chronic L2 and L3 compression fractures, stable. 4. Advanced multilevel disc and facet degeneration with kyphoscoliosis. 5. Moderate spinal stenosis at T12-L1 and L1-2, with more moderate to severe narrowing at L3-4. 6. Prior interbody fusion at L4-5. Electronically Signed   By: Jeannine Boga M.D.   On: 09/27/2017 02:55   Ir Vertebroplasty Cerv/thor Bx Inc Uni/bil Inc/inject/imaging  Result Date: 09/08/2017 INDICATION: Severe thoracolumbar pain secondary to compression fracture at T12. EXAM: VERTEBROPLASTY T12 MEDICATIONS: As antibiotic prophylaxis, Ancef 2 g IV was ordered pre-procedure and administered intravenously within 1  hour of incision. ANESTHESIA/SEDATION: Moderate (conscious) sedation was employed during this procedure. A total of Versed 2 mg and Fentanyl 75 mcg was administered intravenously. Moderate Sedation Time: 19 minutes. The patient's level of consciousness and vital signs were monitored continuously by radiology nursing throughout the procedure under my direct supervision. FLUOROSCOPY TIME:  Fluoroscopy Time: 6 minutes 24 seconds (  397 mGy) COMPLICATIONS: None immediate. TECHNIQUE: Informed written consent was obtained from the patient after a thorough discussion of the procedural risks, benefits and alternatives. All questions were addressed. Maximal Sterile Barrier Technique was utilized including caps, mask, sterile gowns, sterile gloves, sterile drape, hand hygiene and skin antiseptic. A timeout was performed prior to the initiation of the procedure. PROCEDURE: The patient was placed prone on the fluoroscopic table. Nasal oxygen was administered. Physiologic monitoring was performed throughout the duration of the procedure. The skin overlying the thoracolumbar region was prepped and draped in the usual sterile fashion. The T12 vertebral body was identified and the right pedicle was infiltrated with 0.25% Bupivacaine. This was then followed by the advancement of a 13-gauge Cook needle through the right pedicle into the anterior one-third at T12. A gentle contrast injection demonstrated a trabecular pattern of contrast. At this time, methylmethacrylate mixture was reconstituted. Under biplane intermittent fluoroscopy, the methylmethacrylate was then injected into the T12 vertebral body with filling of the vertebral body. No extravasation was noted into the disk spaces or posteriorly into the spinal canal. No epidural venous contamination was seen. The needle was then removed. Hemostasis was achieved at the skin entry site. There were no acute complications. Patient tolerated the procedure well. The patient was observed  for 3 hours and discharged in good condition. IMPRESSION: 1. Status post vertebral body augmentation for painful compression fracture at T12 using vertebroplasty technique. Electronically Signed   By: Luanne Bras M.D.   On: 09/05/2017 14:41    Labs:  CBC: Recent Labs    09/05/17 1116 09/27/17 0349 09/27/17 0358 09/28/17 0359 09/29/17 0523  WBC 9.5 8.5  --  7.4 8.3  HGB 12.4 12.6 13.9 10.6* 11.3*  HCT 39.9 41.0 41.0 34.6* 37.3  PLT 423* 395  --  351 414*    COAGS: Recent Labs    09/05/17 1116  INR 0.98    BMP: Recent Labs    09/05/17 1116 09/27/17 0358 09/27/17 0520 09/28/17 0359 09/29/17 0523  NA 141 140 140 139 142  K 4.4 4.5 4.4 4.3 4.2  CL 104 104 104 104 105  CO2 28  --  26 26 26   GLUCOSE 100* 101* 95 136* 92  BUN 22* 23* 17 27* 29*  CALCIUM 9.3  --  9.2 8.5* 8.5*  CREATININE 1.54* 1.20* 1.32* 1.54* 1.44*  GFRNONAA 30*  --  36* 30* 32*  GFRAA 35*  --  42* 35* 37*    LIVER FUNCTION TESTS: Recent Labs    12/04/16 0939 05/31/17 0858  BILITOT 0.5 0.4  AST 28 14  ALT 17 11  ALKPHOS 56 67  PROT 7.3 7.1  ALBUMIN 4.1 3.8    TUMOR MARKERS: No results for input(s): AFPTM, CEA, CA199, CHROMGRNA in the last 8760 hours.  Assessment and Plan:  Lumbar 1 compression fracture. Plan for image-guided lumbar 1 kyphoplasty/vertebroplasty tomorrow with Dr. Estanislado Pandy. Patient will be NPO at midnight. She is currently not on blood thinners- last dose of Xarelto was before she was admitted 09/26/2017. Denies fever and WBCs WNL. Patient had fever this AM and was diagnosed with UTI and currently on antibiotics- will recheck UA.  Risks and benefits of lumbar 1 kyphoplasty/vertebroplasty were discussed with the patient including, but not limited to education regarding the natural healing process of compression fractures without intervention, bleeding, infection, cement migration which may cause spinal cord damage, paralysis, pulmonary embolism or even death. This  interventional procedure involves the use of X-rays and because  of the nature of the planned procedure, it is possible that we will have prolonged use of X-ray fluoroscopy. Potential radiation risks to you include (but are not limited to) the following: - A slightly elevated risk for cancer  several years later in life. This risk is typically less than 0.5% percent. This risk is low in comparison to the normal incidence of human cancer, which is 33% for women and 50% for men according to the [employer]. - Radiation induced injury can include skin redness, resembling a rash, tissue breakdown / ulcers and hair loss (which can be temporary or permanent).  The likelihood of either of these occurring depends on the difficulty of the procedure and whether you are sensitive to radiation due to previous procedures, disease, or genetic conditions.  IF your procedure requires a prolonged use of radiation, you will be notified and given written instructions for further action.  It is your responsibility to monitor the irradiated area for the 2 weeks following the procedure and to notify your physician if you are concerned that you have suffered a radiation induced injury.   All of the patient's questions were answered, patient is agreeable to proceed. Consent signed and in chart.  Thank you for this interesting consult.  I greatly enjoyed meeting Brenda Hall and look forward to participating in their care.  A copy of this report was sent to the requesting provider on this date.  Electronically Signed: Earley Abide, PA-C 09/29/2017, 2:17 PM   I spent a total of 40 Minutes in face to face in clinical consultation, greater than 50% of which was counseling/coordinating care for lumbar 1 compression fracture.

## 2017-09-29 NOTE — Progress Notes (Signed)
Patient states she is worried about going straight home after surgery due to no one being with her during the day. Would like to know her options.

## 2017-09-30 ENCOUNTER — Inpatient Hospital Stay (HOSPITAL_COMMUNITY): Payer: Medicare Other

## 2017-09-30 ENCOUNTER — Other Ambulatory Visit: Payer: Self-pay | Admitting: Cardiology

## 2017-09-30 HISTORY — PX: IR KYPHO LUMBAR INC FX REDUCE BONE BX UNI/BIL CANNULATION INC/IMAGING: IMG5519

## 2017-09-30 LAB — CBC
HEMATOCRIT: 40.7 % (ref 36.0–46.0)
Hemoglobin: 12.2 g/dL (ref 12.0–15.0)
MCH: 28.4 pg (ref 26.0–34.0)
MCHC: 30 g/dL (ref 30.0–36.0)
MCV: 94.7 fL (ref 78.0–100.0)
Platelets: 366 10*3/uL (ref 150–400)
RBC: 4.3 MIL/uL (ref 3.87–5.11)
RDW: 14.3 % (ref 11.5–15.5)
WBC: 9.4 10*3/uL (ref 4.0–10.5)

## 2017-09-30 LAB — URINALYSIS, COMPLETE (UACMP) WITH MICROSCOPIC
BACTERIA UA: NONE SEEN
Bilirubin Urine: NEGATIVE
Glucose, UA: NEGATIVE mg/dL
Hgb urine dipstick: NEGATIVE
Ketones, ur: NEGATIVE mg/dL
Leukocytes, UA: NEGATIVE
Nitrite: NEGATIVE
Protein, ur: NEGATIVE mg/dL
SPECIFIC GRAVITY, URINE: 1.011 (ref 1.005–1.030)
pH: 6 (ref 5.0–8.0)

## 2017-09-30 LAB — BASIC METABOLIC PANEL
ANION GAP: 9 (ref 5–15)
BUN: 26 mg/dL — ABNORMAL HIGH (ref 6–20)
CO2: 24 mmol/L (ref 22–32)
Calcium: 8.6 mg/dL — ABNORMAL LOW (ref 8.9–10.3)
Chloride: 108 mmol/L (ref 101–111)
Creatinine, Ser: 1.34 mg/dL — ABNORMAL HIGH (ref 0.44–1.00)
GFR, EST AFRICAN AMERICAN: 41 mL/min — AB (ref >60)
GFR, EST NON AFRICAN AMERICAN: 35 mL/min — AB (ref >60)
Glucose, Bld: 86 mg/dL (ref 65–99)
POTASSIUM: 4.6 mmol/L (ref 3.5–5.1)
Sodium: 141 mmol/L (ref 135–145)

## 2017-09-30 LAB — APTT: aPTT: 23 seconds — ABNORMAL LOW (ref 24–36)

## 2017-09-30 LAB — PROTIME-INR
INR: 1.01
PROTHROMBIN TIME: 13.2 s (ref 11.4–15.2)

## 2017-09-30 LAB — SURGICAL PCR SCREEN
MRSA, PCR: NEGATIVE
STAPHYLOCOCCUS AUREUS: NEGATIVE

## 2017-09-30 LAB — PROCALCITONIN: Procalcitonin: <0.1 ng/mL

## 2017-09-30 MED ORDER — FENTANYL CITRATE (PF) 100 MCG/2ML IJ SOLN
INTRAMUSCULAR | Status: AC | PRN
  Administered 2017-09-30 (×3): 25 ug via INTRAVENOUS

## 2017-09-30 MED ORDER — FENTANYL CITRATE (PF) 100 MCG/2ML IJ SOLN
INTRAMUSCULAR | Status: AC
  Filled 2017-09-30: qty 2

## 2017-09-30 MED ORDER — IOPAMIDOL (ISOVUE-300) INJECTION 61%
INTRAVENOUS | Status: AC
  Administered 2017-09-30: 10 mL
  Filled 2017-09-30: qty 50

## 2017-09-30 MED ORDER — MIDAZOLAM HCL 2 MG/2ML IJ SOLN
INTRAMUSCULAR | Status: AC
Start: 2017-09-30 — End: 2017-10-01
  Filled 2017-09-30: qty 2

## 2017-09-30 MED ORDER — SODIUM CHLORIDE 0.9 % IV SOLN
INTRAVENOUS | Status: AC
  Administered 2017-09-30: 17:00:00 via INTRAVENOUS

## 2017-09-30 MED ORDER — SODIUM CHLORIDE 0.9 % IV SOLN
500.0000 mg | INTRAVENOUS | Status: DC
  Administered 2017-09-30 – 2017-10-02 (×3): 500 mg via INTRAVENOUS
  Filled 2017-09-30 (×4): qty 500

## 2017-09-30 MED ORDER — GELATIN ABSORBABLE 12-7 MM EX MISC
CUTANEOUS | Status: AC
  Filled 2017-09-30: qty 1

## 2017-09-30 MED ORDER — TOBRAMYCIN SULFATE 1.2 G IJ SOLR
INTRAMUSCULAR | Status: AC
  Administered 2017-09-30: 16:00:00
  Filled 2017-09-30: qty 1.2

## 2017-09-30 MED ORDER — MIDAZOLAM HCL 2 MG/2ML IJ SOLN
INTRAMUSCULAR | Status: AC | PRN
  Administered 2017-09-30 (×2): 1 mg via INTRAVENOUS

## 2017-09-30 MED ORDER — BUPIVACAINE HCL (PF) 0.5 % IJ SOLN
INTRAMUSCULAR | Status: AC
  Administered 2017-09-30: 25 mL
  Filled 2017-09-30: qty 30

## 2017-09-30 MED ORDER — CEFAZOLIN SODIUM-DEXTROSE 2-4 GM/100ML-% IV SOLN
INTRAVENOUS | Status: AC
  Filled 2017-09-30: qty 100

## 2017-09-30 NOTE — Procedures (Signed)
S/P L1 balloon KP 

## 2017-09-30 NOTE — Progress Notes (Signed)
PT Cancellation Note  Patient Details Name: Brenda Hall MRN: [id number] DOB: 02/02/1931   Cancelled Treatment:    Reason Eval/Treat Not Completed: Active bedrest order Pt on bedrest for 3 hours following procedure. Will check back as schedule allows.   Leighton Ruff, PT, DPT  Acute Rehabilitation Services  Pager: [phone]  Rudean Hitt 09/30/2017, 5:06 PM

## 2017-09-30 NOTE — Progress Notes (Signed)
PROGRESS NOTE                                                                                                                                                                                                             Patient Demographics:    Brenda Hall, is a 84 y.o. female, DOB - February 12, 1931, JFH:[id number]  Admit date - 09/26/2017   Admitting Physician Florencia Reasons, MD  Outpatient Primary MD for the patient is Luking, Grace Bushy, MD  LOS - 2   Chief Complaint  Patient presents with  . Back Pain       Brief Narrative   84 y.o. female with medical history significant for hypertension, CAD, MI 2007, A. Fib, carotid stenosis, hyperlipidemia, TIA, CVA, severe osteoporosis, osteoarthritis, polymyalgia rheumatica and lumbar 2 compression fracture status post vertebroplasy in march, as well as T12 vertebral plasty last month presents to the emergency department with worsening lower back pain, work-up significant for new L1 fracture and UTI, plan for L1 vertebroplasty by neuro IR today, patient spiked fever 102.2, 09/28/2017 chest x-ray suspicious for left retrocardiac opacity.    Subjective:    Brenda Hall today has, No headache, No chest pain, No abdominal pain -complains of lower back pain, well over the last 24 hours, denies any dysuria or cough or shortness of breath.   a Principal Problem:   L1 vertebral fracture (HCC) Active Problems:   Essential hypertension   CAD, NATIVE VESSEL   History of COPD   Basal cell carcinoma   UTI (urinary tract infection)   CKD (chronic kidney disease), stage III (HCC)   History of TIA (transient ischemic attack)   PAF (paroxysmal atrial fibrillation) (HCC)   intractable back pain -Secondary to new acute L1 fracture, as showing an MRI, did receive IV Decadron in ED, continue with Flexeril, and PRN pain medication. -PT halted -For vertebroplasty today byneuro IR Dr. August Luz , [place] 09/26/2017, Lovenox  last dose yesterday afternoon, . -We will resume Xarelto when cleared by IR .  Urinary tract infection.  -Continue with IV Rocephin, culture growing pansensitive E. coli, continue with IV Rocephin. -Significant fever 102.4 09/28/2017, no CVA tenderness, nontoxic-appearing, blood culture with no growth to date, chest x-ray was obtained, with questionable left retrocardiac opacity, have chronic left lower lung and another x-ray 2018, but for now I will add  azithromycin empirically , procalcitonin within normal limits . -Have encouraged her in again today to use incentive spirometry .   Hypertension.  -Acceptable, continue with home medication   chronic kidney disease stage III. -Creatinine at baseline, continue to monitor  History of TIA/stroke/A. Fib.  -Rate controlled, Xarelto on hold in anticipation for procedures, continue with beta-blockers for heart rate controlled, given elevated CHAds 2 VAsc , was kept on Lovenox, on hold last 24 hours in anticipation for procedure today, to resume alto once cleared by IR .Marland Kitchen  COPD. - Not on home oxygen. Stable at baseline, encouraged to use incentive spirometry today  - Patient with recent right lower eyelid surgery resection of basal cell carcinoma, continue with home meds and follow with ophthalmology as an outpatient.     Code Status : Full  Family Communication  : None at bedside  Disposition Plan  : PT consulted  Consults  : neuro IR  Procedures  : None  DVT Prophylaxis  :  Lovenox full dose on hold from yesterday in anticipation for procedures, to be resumed on L2 once cleared by neuro IR.  Lab Results  Component Value Date   PLT 366 09/30/2017    Antibiotics  :   Anti-infectives (From admission, onward)   Start     Dose/Rate Route Frequency Ordered Stop   09/30/17 0715  azithromycin (ZITHROMAX) 500 mg in sodium chloride 0.9 % 250 mL IVPB     500 mg 250 mL/hr over 60 Minutes Intravenous Every 24 hours 09/30/17 0706      09/28/17 0800  cefTRIAXone (ROCEPHIN) 1 g in sodium chloride 0.9 % 100 mL IVPB     1 g 200 mL/hr over 30 Minutes Intravenous Every 24 hours 09/27/17 0654     09/27/17 0545  cefTRIAXone (ROCEPHIN) 1 g in sodium chloride 0.9 % 100 mL IVPB     1 g 200 mL/hr over 30 Minutes Intravenous  Once 09/27/17 0531 09/27/17 0653        Objective:   Vitals:   09/29/17 0613 09/29/17 0800 09/29/17 2116 09/30/17 0532  BP:   (!) 163/67 (!) 149/82  Pulse:   67 94  Resp:   18 18  Temp:  (!) 97.3 F (36.3 C) (!) 97.5 F (36.4 C) 97.7 F (36.5 C)  TempSrc:  Oral Oral Axillary  SpO2:   97% 95%  Weight: 72.4 kg (159 lb 9.8 oz)     Height:        Wt Readings from Last 3 Encounters:  09/29/17 72.4 kg (159 lb 9.8 oz)  09/05/17 70.3 kg (155 lb)  09/02/17 71.7 kg (158 lb)     Intake/Output Summary (Last 24 hours) at 09/30/2017 0828 Last data filed at 09/30/2017 0319 Gross per 24 hour  Intake 600 ml  Output 350 ml  Net 250 ml     Physical Exam  Awake Alert, Oriented X 3, No new F.N deficits, Normal affect,  mild bruise at right lower eyelid from recent procedure, no discharge or bleed Symmetrical Chest wall movement, Good air movement bilaterally, CTAB RRR,No Gallops,Rubs or new Murmurs, No Parasternal Heave +ve B.Sounds, Abd Soft, No tenderness, No rebound - guarding or rigidity. No Cyanosis, Clubbing or edema, No new Rash or bruise       Data Review:    CBC Recent Labs  Lab 09/27/17 0349 09/27/17 0358 09/28/17 0359 09/29/17 0523 09/30/17 0440  WBC 8.5  --  7.4 8.3 9.4  HGB 12.6 13.9 10.6* 11.3* 12.2  HCT 41.0 41.0 34.6* 37.3 40.7  PLT 395  --  351 414* 366  MCV 94.3  --  94.0 94.0 94.7  MCH 29.0  --  28.8 28.5 28.4  MCHC 30.7  --  30.6 30.3 30.0  RDW 14.3  --  14.2 14.3 14.3    Chemistries  Recent Labs  Lab 09/27/17 0358 09/27/17 0520 09/28/17 0359 09/29/17 0523 09/30/17 0440  NA 140 140 139 142 141  K 4.5 4.4 4.3 4.2 4.6  CL 104 104 104 105 108  CO2  --  26 26 26  24   GLUCOSE 101* 95 136* 92 86  BUN 23* 17 27* 29* 26*  CREATININE 1.20* 1.32* 1.54* 1.44* 1.34*  CALCIUM  --  9.2 8.5* 8.5* 8.6*   ------------------------------------------------------------------------------------------------------------------ No results for input(s): CHOL, HDL, LDLCALC, TRIG, CHOLHDL, LDLDIRECT in the last 72 hours.  No results found for: HGBA1C ------------------------------------------------------------------------------------------------------------------ No results for input(s): TSH, T4TOTAL, T3FREE, THYROIDAB in the last 72 hours.  Invalid input(s): FREET3 ------------------------------------------------------------------------------------------------------------------ No results for input(s): VITAMINB12, FOLATE, FERRITIN, TIBC, IRON, RETICCTPCT in the last 72 hours.  Coagulation profile No results for input(s): INR, PROTIME in the last 168 hours.  No results for input(s): DDIMER in the last 72 hours.  Cardiac Enzymes No results for input(s): CKMB, TROPONINI, MYOGLOBIN in the last 168 hours.  Invalid input(s): CK ------------------------------------------------------------------------------------------------------------------    Component Value Date/Time   BNP 440.0 (H) 02/12/2017 2014    Inpatient Medications  Scheduled Meds: . amLODipine  5 mg Oral Daily  . atorvastatin  80 mg Oral QHS  . estrogens (conjugated)  0.3 mg Oral QPM  . gabapentin  100 mg Oral QHS  . metoprolol succinate  75 mg Oral QPM  . pantoprazole  40 mg Oral q morning - 10a  . predniSONE  5 mg Oral Q breakfast   Continuous Infusions: . azithromycin    . cefTRIAXone (ROCEPHIN)  IV Stopped (09/29/17 0912)   PRN Meds:.acetaminophen **OR** acetaminophen, fluticasone, methocarbamol, morphine injection, nitroGLYCERIN, ondansetron **OR** ondansetron (ZOFRAN) IV, senna-docusate  Micro Results Recent Results (from the past 240 hour(s))  Urine culture     Status: Abnormal    Collection Time: 09/27/17  5:32 AM  Result Value Ref Range Status   Specimen Description URINE, CLEAN CATCH  Final   Special Requests   Final    Normal Performed at [facility], [place] [address], [place], [place] [address]    Culture >=100,000 COLONIES/mL ESCHERICHIA COLI (A)  Final   Report Status 09/29/2017 FINAL  Final   Organism ID, Bacteria ESCHERICHIA COLI (A)  Final      Susceptibility   Escherichia coli - MIC*    AMPICILLIN <=2 SENSITIVE Sensitive     CEFAZOLIN <=4 SENSITIVE Sensitive     CEFTRIAXONE <=1 SENSITIVE Sensitive     CIPROFLOXACIN <=0.25 SENSITIVE Sensitive     GENTAMICIN <=1 SENSITIVE Sensitive     IMIPENEM <=0.25 SENSITIVE Sensitive     NITROFURANTOIN <=16 SENSITIVE Sensitive     TRIMETH/SULFA <=20 SENSITIVE Sensitive     AMPICILLIN/SULBACTAM <=2 SENSITIVE Sensitive     PIP/TAZO <=4 SENSITIVE Sensitive     Extended ESBL NEGATIVE Sensitive     * >=100,000 COLONIES/mL ESCHERICHIA COLI  Culture, blood (Routine X 2) w Reflex to ID Panel     Status: None (Preliminary result)   Collection Time: 09/29/17  2:22 PM  Result Value Ref Range Status   Specimen Description BLOOD RIGHT ANTECUBITAL  Final   Special Requests  Final    BOTTLES DRAWN AEROBIC ONLY Blood Culture results may not be optimal due to an inadequate volume of blood received in culture bottles   Culture PENDING  Incomplete   Report Status PENDING  Incomplete    Radiology Reports Mr Lumbar Spine Wo Contrast  Result Date: 09/27/2017 CLINICAL DATA:  Initial evaluation for severe low back pain over past 2 days, recent vertebral augmentation at T12. EXAM: MRI LUMBAR SPINE WITHOUT CONTRAST TECHNIQUE: Multiplanar, multisequence MR imaging of the lumbar spine was performed. No intravenous contrast was administered. COMPARISON:  Prior MRI from 08/21/2017. FINDINGS: Segmentation: Normal segmentation. Lowest well-formed disc labeled the L5-S1 level. Same numbering system is employed as on previous exam.  Alignment: Levoscoliosis with reversal of the normal lumbar lordosis. Stable anterolisthesis of L2 on L3 and L3 on L4, with trace retrolisthesis of L5 on S1. Vertebrae: Sequelae of interval vertebral augmentation at T12 for previously identified fracture. Increased height loss now measuring up to 40% centrally. Mild 5 mm retropulsion relatively stable. Persistent marrow edema without new fracture line. There is a new acute fracture extending through the superior endplate of L1 with abnormal marrow edema throughout the superior and mid aspect of the L1 vertebral body. Minimal central height loss without bony retropulsion. No underlying pathologic lesion. Chronic L2 fracture with sequelae of prior vertebral augmentation is unchanged. Chronic L3 fracture also stable. Bone marrow signal intensity heterogeneous but within normal limits. Prominent reactive endplate changes at the L4-5 and L5-S1 interspaces. No worrisome osseous lesions. Conus medullaris and cauda equina: Conus extends to the L1-2 level. Conus and cauda equina appear normal. Paraspinal and other soft tissues: Mild edema within the paraspinous soft tissues adjacent to the T12 and L1 compression fractures. Chronic fatty atrophy noted within the lower posterior paraspinous musculature. Innumerable bilateral renal cysts noted, a few of which demonstrate T2 hypointensity with intermediate T1 signal, likely proteinaceous and/or hemorrhagic cyst. Disc levels: T12-L1: 5 mm bony retropulsion related to the T12 compression fracture with mild disc bulge. Mild facet hypertrophy. Moderate spinal stenosis without cord impingement. Mild bilateral foraminal narrowing. L1-2: Diffuse disc bulge with intervertebral disc space narrowing. Moderate facet hypertrophy, greater on the right. Resultant moderate spinal stenosis, stable. Neural foramina remain patent. L2-3: Mild disc bulge with flattening of the ventral thecal sac. Moderate facet hypertrophy. Mild spinal stenosis,  stable. Neural foramina remain patent. L3-4: Diffuse degenerative disc bulge with disc desiccation and intervertebral disc space narrowing. Advanced bilateral facet degeneration with ligamentum flavum hypertrophy. Moderate to severe spinal stenosis with left greater than right lateral recess narrowing, similar to previous. Foramina remain patent. L4-5: Remote interbody fusion. Prior right hemi laminectomy. No residual canal or foraminal stenosis. L5-S1: Retrolisthesis. Diffuse disc bulge with chronic reactive endplate changes. Moderate facet and ligament flavum hypertrophy. Remote right laminectomy. Central canal remains patent. Mild left with moderate right L5 foraminal stenosis. IMPRESSION: 1. New/interval L1 vertebral fracture with minimal central height loss without bony retropulsion. 2. Sequelae of interval vertebral augmentation at T12 with mildly progressive height loss and persistent marrow edema. 3. Chronic L2 and L3 compression fractures, stable. 4. Advanced multilevel disc and facet degeneration with kyphoscoliosis. 5. Moderate spinal stenosis at T12-L1 and L1-2, with more moderate to severe narrowing at L3-4. 6. Prior interbody fusion at L4-5. Electronically Signed   By: Jeannine Boga M.D.   On: 09/27/2017 02:55   Dg Chest Port 1 View  Result Date: 09/29/2017 CLINICAL DATA:  84 year old female with a history of fever EXAM: PORTABLE CHEST 1 VIEW  COMPARISON:  02/13/2017, 02/12/2017 FINDINGS: Cardiomediastinal silhouette unchanged in size and contour. Patchy opacity in the retrocardiac region with partial obscuration left hemidiaphragm. No large pleural effusion. Coarsened interstitial markings bilaterally. No pneumothorax. No acute displaced fracture IMPRESSION: Retrocardiac patchy opacity, may represent developing infection or chronic changes. Electronically Signed   By: Corrie Mckusick D.O.   On: 09/29/2017 15:51   Ir Vertebroplasty Cerv/thor Bx Inc Uni/bil Inc/inject/imaging  Result Date:  09/08/2017 INDICATION: Severe thoracolumbar pain secondary to compression fracture at T12. EXAM: VERTEBROPLASTY T12 MEDICATIONS: As antibiotic prophylaxis, Ancef 2 g IV was ordered pre-procedure and administered intravenously within 1 hour of incision. ANESTHESIA/SEDATION: Moderate (conscious) sedation was employed during this procedure. A total of Versed 2 mg and Fentanyl 75 mcg was administered intravenously. Moderate Sedation Time: 19 minutes. The patient's level of consciousness and vital signs were monitored continuously by radiology nursing throughout the procedure under my direct supervision. FLUOROSCOPY TIME:  Fluoroscopy Time: 6 minutes 24 seconds (295 mGy) COMPLICATIONS: None immediate. TECHNIQUE: Informed written consent was obtained from the patient after a thorough discussion of the procedural risks, benefits and alternatives. All questions were addressed. Maximal Sterile Barrier Technique was utilized including caps, mask, sterile gowns, sterile gloves, sterile drape, hand hygiene and skin antiseptic. A timeout was performed prior to the initiation of the procedure. PROCEDURE: The patient was placed prone on the fluoroscopic table. Nasal oxygen was administered. Physiologic monitoring was performed throughout the duration of the procedure. The skin overlying the thoracolumbar region was prepped and draped in the usual sterile fashion. The T12 vertebral body was identified and the right pedicle was infiltrated with 0.25% Bupivacaine. This was then followed by the advancement of a 13-gauge Cook needle through the right pedicle into the anterior one-third at T12. A gentle contrast injection demonstrated a trabecular pattern of contrast. At this time, methylmethacrylate mixture was reconstituted. Under biplane intermittent fluoroscopy, the methylmethacrylate was then injected into the T12 vertebral body with filling of the vertebral body. No extravasation was noted into the disk spaces or posteriorly into  the spinal canal. No epidural venous contamination was seen. The needle was then removed. Hemostasis was achieved at the skin entry site. There were no acute complications. Patient tolerated the procedure well. The patient was observed for 3 hours and discharged in good condition. IMPRESSION: 1. Status post vertebral body augmentation for painful compression fracture at T12 using vertebroplasty technique. Electronically Signed   By: Luanne Bras M.D.   On: 09/05/2017 14:41      Phillips Climes M.D on 09/30/2017 at 8:28 AM  Between 7am to 7pm - Pager - [phone]  After 7pm go to [email or web address] - password [facility]  [facility] -  Office  [phone]

## 2017-09-30 NOTE — Plan of Care (Signed)
  Problem: Education: Goal: Knowledge of General Education information will improve Outcome: Progressing   Problem: Clinical Measurements: Goal: Ability to maintain clinical measurements within normal limits will improve Outcome: Progressing   Problem: Activity: Goal: Risk for activity intolerance will decrease Outcome: Progressing   Problem: Nutrition: Goal: Adequate nutrition will be maintained Outcome: Progressing   Problem: Pain Managment: Goal: General experience of comfort will improve Outcome: Progressing

## 2017-10-01 DIAGNOSIS — S32010A Wedge compression fracture of first lumbar vertebra, initial encounter for closed fracture: Secondary | ICD-10-CM

## 2017-10-01 DIAGNOSIS — Z8673 Personal history of transient ischemic attack (TIA), and cerebral infarction without residual deficits: Secondary | ICD-10-CM

## 2017-10-01 DIAGNOSIS — J189 Pneumonia, unspecified organism: Secondary | ICD-10-CM

## 2017-10-01 DIAGNOSIS — N183 Chronic kidney disease, stage 3 (moderate): Secondary | ICD-10-CM

## 2017-10-01 DIAGNOSIS — I10 Essential (primary) hypertension: Secondary | ICD-10-CM

## 2017-10-01 DIAGNOSIS — Z8709 Personal history of other diseases of the respiratory system: Secondary | ICD-10-CM

## 2017-10-01 DIAGNOSIS — N390 Urinary tract infection, site not specified: Secondary | ICD-10-CM

## 2017-10-01 DIAGNOSIS — I480 Paroxysmal atrial fibrillation: Secondary | ICD-10-CM

## 2017-10-01 LAB — CBC
HCT: 35.4 % — ABNORMAL LOW (ref 36.0–46.0)
Hemoglobin: 10.7 g/dL — ABNORMAL LOW (ref 12.0–15.0)
MCH: 28.8 pg (ref 26.0–34.0)
MCHC: 30.2 g/dL (ref 30.0–36.0)
MCV: 95.2 fL (ref 78.0–100.0)
PLATELETS: 340 10*3/uL (ref 150–400)
RBC: 3.72 MIL/uL — ABNORMAL LOW (ref 3.87–5.11)
RDW: 14.4 % (ref 11.5–15.5)
WBC: 7.7 10*3/uL (ref 4.0–10.5)

## 2017-10-01 LAB — BASIC METABOLIC PANEL
Anion gap: 8 (ref 5–15)
BUN: 16 mg/dL (ref 6–20)
CHLORIDE: 108 mmol/L (ref 101–111)
CO2: 26 mmol/L (ref 22–32)
CREATININE: 0.98 mg/dL (ref 0.44–1.00)
Calcium: 8.5 mg/dL — ABNORMAL LOW (ref 8.9–10.3)
GFR calc Af Amer: 60 mL/min — ABNORMAL LOW (ref >60)
GFR, EST NON AFRICAN AMERICAN: 52 mL/min — AB (ref >60)
GLUCOSE: 86 mg/dL (ref 65–99)
Potassium: 4.2 mmol/L (ref 3.5–5.1)
SODIUM: 142 mmol/L (ref 135–145)

## 2017-10-01 MED ORDER — TRAMADOL HCL 50 MG PO TABS
50.0000 mg | ORAL_TABLET | Freq: Four times a day (QID) | ORAL | Status: DC | PRN
  Administered 2017-10-01 – 2017-10-02 (×5): 50 mg via ORAL
  Filled 2017-10-01 (×5): qty 1

## 2017-10-01 MED ORDER — METHOCARBAMOL 1000 MG/10ML IJ SOLN
500.0000 mg | Freq: Once | INTRAVENOUS | Status: AC
  Administered 2017-10-01: 500 mg via INTRAVENOUS
  Filled 2017-10-01: qty 5

## 2017-10-01 MED ORDER — LIDOCAINE 5 % EX PTCH
1.0000 | MEDICATED_PATCH | CUTANEOUS | Status: DC
  Administered 2017-10-01 – 2017-10-02 (×2): 1 via TRANSDERMAL
  Filled 2017-10-01 (×3): qty 1

## 2017-10-01 MED ORDER — RIVAROXABAN 15 MG PO TABS
15.0000 mg | ORAL_TABLET | Freq: Every day | ORAL | Status: DC
  Administered 2017-10-01 – 2017-10-02 (×2): 15 mg via ORAL
  Filled 2017-10-01 (×2): qty 1

## 2017-10-01 NOTE — Plan of Care (Signed)
  Problem: Education: Goal: Knowledge of General Education information will improve Outcome: Progressing   Problem: Clinical Measurements: Goal: Ability to maintain clinical measurements within normal limits will improve Outcome: Progressing   Problem: Clinical Measurements: Goal: Will remain free from infection Outcome: Progressing   Problem: Activity: Goal: Risk for activity intolerance will decrease Outcome: Progressing   Problem: Pain Managment: Goal: General experience of comfort will improve Outcome: Progressing   

## 2017-10-01 NOTE — Evaluation (Signed)
Occupational Therapy Evaluation Patient Details Name: Brenda Hall MRN: [id number] DOB: 10-25-1930 Today's Date: 10/01/2017    History of Present Illness Pt is an 84 y.o. female with medical history significant for hypertension, CAD, MI 2007, atrial fibrillation, carotid stenosis, hyperlipidemia, TIA, CVA, severe osteoporosis, osteoarthritis, polymyalgia rheumatica and lumbar 2 compression fracture s/p vertebroplasty in March, T12 vertebroplasty in May. She presented to the ED from interventional radiology with intractable back pain. MRI revealed acute L1 fracture. She is now s/p L1 balloon KP 6/11.    Clinical Impression   PTA, pt was independent with rollator for ADL and functional mobility. She has been quite limited by back pain over the past few months. Pt currently requires min assist for LB ADL, min assist for toilet transfers, and mod assist for bed mobility in preparation for ADL participation. She is very motivated to return to independence. Initiated education concerning back precautions related to ADL tasks and cued pt for adherence to these throughout session. She would benefit from continued OT services while admitted to improve independence and safety with ADL and functional mobility. At current functional level, pt will need 24 hour assistance as well as OT follow-up. Recommend short-term SNF level rehabilitation unless pt able to have 24 hour support at home. Will continue to follow while admitted.     Follow Up Recommendations  SNF;Supervision/Assistance - 24 hour    Equipment Recommendations  3 in 1 bedside commode    Recommendations for Other Services       Precautions / Restrictions Precautions Precautions: Back Precaution Booklet Issued: No Precaution Comments: Educated concerning back precautions related to ADL participation.  Restrictions Weight Bearing Restrictions: No      Mobility Bed Mobility Overal bed mobility: Needs Assistance Bed Mobility:  Rolling;Sidelying to Sit Rolling: Min assist Sidelying to sit: Mod assist       General bed mobility comments: Mod assist to raise trunk from [facility]. Pt reporting that log rolling is more painful. Educated concerning need to log roll for spine safety.   Transfers Overall transfer level: Needs assistance Equipment used: Rolling walker (2 wheeled) Transfers: Sit to/from Stand Sit to Stand: Min assist         General transfer comment: Min assist to power up to standing safely.     Balance Overall balance assessment: Needs assistance Sitting-balance support: No upper extremity supported;Feet supported Sitting balance-Leahy Scale: Fair Sitting balance - Comments: BUE support for comfort but able to sustain without this   Standing balance support: Bilateral upper extremity supported;No upper extremity supported Standing balance-Leahy Scale: Fair Standing balance comment: Able to stand wtihout UE support at sink for ADL.                            ADL either performed or assessed with clinical judgement   ADL Overall ADL's : Needs assistance/impaired Eating/Feeding: Set up;Sitting   Grooming: Min guard;Standing   Upper Body Bathing: Set up;Sitting   Lower Body Bathing: Minimal assistance;Sit to/from stand   Upper Body Dressing : Set up;Sitting   Lower Body Dressing: Minimal assistance;Sit to/from stand   Toilet Transfer: Minimal assistance;Ambulation(rollator)   Toileting- Water quality scientist and Hygiene: Min guard;Sit to/from stand       Functional mobility during ADLs: Minimal assistance(rollator) General ADL Comments: Min guard for functional mobility once on feet. Min assist to power up. Pt requiring cues for precautions throughout.      Vision Patient Visual Report: No change from  baseline Vision Assessment?: No apparent visual deficits     Perception     Praxis      Pertinent Vitals/Pain Pain Assessment: 0-10 Pain Score: 7 (up to 7 with  movement) Pain Location: lumbar spine Pain Descriptors / Indicators: Aching;Operative site guarding;Sore Pain Intervention(s): Limited activity within patient's tolerance;Monitored during session;Repositioned     Hand Dominance     Extremity/Trunk Assessment Upper Extremity Assessment Upper Extremity Assessment: Generalized weakness   Lower Extremity Assessment Lower Extremity Assessment: Generalized weakness       Communication Communication Communication: No difficulties   Cognition Arousal/Alertness: Awake/alert Behavior During Therapy: WFL for tasks assessed/performed Overall Cognitive Status: No family/caregiver present to determine baseline cognitive functioning Area of Impairment: Memory                     Memory: Decreased short-term memory         General Comments: Pt with difficulty remembering what she was doing once she ambulated to the sink (she had stated that she intended to brush her teeth).   General Comments  Notified RNCM that pt may benefit from Home First program if able to qualify and she reports that she will look into this.    Exercises     Shoulder Instructions      Home Living Family/patient expects to be discharged to:: Private residence Living Arrangements: Other relatives(grandchild) Available Help at Discharge: Family;Available PRN/intermittently Type of Home: House Home Access: Stairs to enter [employer] of Steps: 4 Entrance Stairs-Rails: Right;Left Home Layout: One level     Bathroom Shower/Tub: Occupational psychologist: Handicapped height     Home Equipment: Environmental consultant - 4 wheels;Shower seat;Hand held shower head   Additional Comments: Pt reporting that her grandson lives in basement and is there overnight and available by phone.       Prior Functioning/Environment Level of Independence: Needs assistance  Gait / Transfers Assistance Needed: Uses rollator ADL's / Homemaking Assistance Needed:  Independent for basic ADL            OT Problem List: Decreased strength;Decreased range of motion;Decreased activity tolerance;Impaired balance (sitting and/or standing);Decreased safety awareness;Decreased knowledge of use of DME or AE;Decreased knowledge of precautions;Pain;Impaired UE functional use      OT Treatment/Interventions: Self-care/ADL training;Therapeutic exercise;Energy conservation;DME and/or AE instruction;Therapeutic activities;Patient/family education;Balance training    OT Goals(Current goals can be found in the care plan section) Acute Rehab OT Goals Patient Stated Goal: to feel better and get back to water aerobics OT Goal Formulation: With patient Time For Goal Achievement: 10/15/17 Potential to Achieve Goals: Good ADL Goals Pt Will Perform Grooming: with modified independence;standing Pt Will Perform Lower Body Bathing: with modified independence;sit to/from stand Pt Will Perform Lower Body Dressing: with modified independence;sit to/from stand Pt Will Transfer to Toilet: with modified independence;ambulating;bedside commode(BSC over toilet) Pt Will Perform Toileting - Clothing Manipulation and hygiene: with modified independence;sit to/from stand  OT Frequency: Min 2X/week   Barriers to D/C:            Co-evaluation              AM-PAC PT "6 Clicks" Daily Activity     Outcome Measure Help from another person eating meals?: None Help from another person taking care of personal grooming?: A Little Help from another person toileting, which includes using toliet, bedpan, or urinal?: A Little Help from another person bathing (including washing, rinsing, drying)?: A Little Help from another person to put on  and taking off regular upper body clothing?: A Little Help from another person to put on and taking off regular lower body clothing?: A Little 6 Click Score: 19   End of Session Equipment Utilized During Treatment: Gait belt;Other  (comment)(Rollator) Nurse Communication: Mobility status  Activity Tolerance: Patient tolerated treatment well Patient left: in chair;with call bell/phone within reach;Other (comment)(preparing to work with PT who was in the room)  OT Visit Diagnosis: Other abnormalities of gait and mobility (R26.89);Pain Pain - part of body: (back)                Time: 1194-1740 OT Time Calculation (min): 34 min Charges:  OT General Charges $OT Visit: 1 Visit OT Evaluation $OT Eval Moderate Complexity: 1 Mod OT Treatments $Self Care/Home Management : 8-22 mins G-Codes:     Norman Herrlich, MS OTR/L  Pager: [place] 10/01/2017, 3:48 PM

## 2017-10-01 NOTE — Progress Notes (Signed)
PROGRESS NOTE    Brenda Hall  YQI:[id number] DOB: Aug 30, 1930 DOA: 09/26/2017 PCP: [name], MD    Brief Narrative:  84 y.o.femalewith medical history significantfor hypertension, CAD, MI 2007, A. Fib, carotid stenosis, hyperlipidemia, TIA, CVA, severe osteoporosis, osteoarthritis, polymyalgia rheumatica and lumbar 2 compression fracture status post vertebroplasy in march, as well as T12 vertebral plasty last month presents to the emergency department with worsening lower back pain, work-up significant for new L1 fracture and UTI, plan for L1 vertebroplasty by neuro IR today, patient spiked fever 102.2, 09/28/2017 chest x-ray suspicious for left retrocardiac opacity.     Assessment & Plan:   Principal Problem:   L1 vertebral fracture (HCC) Active Problems:   Essential hypertension   CAD, NATIVE VESSEL   History of COPD   Basal cell carcinoma   UTI (urinary tract infection)   CKD (chronic kidney disease), stage III (HCC)   History of TIA (transient ischemic attack)   PAF (paroxysmal atrial fibrillation) ([place])   Community acquired pneumonia  #1 new acute L1 fracture Per MRI.  Patient received IV Decadron in the ED.  Since status post L1 balloon kyphoplasty 09/30/2017 per Dr. Marjory Lies Continue Robaxin as needed.  Placed on a Lidoderm patch.  Ultram as needed.  PT/OT pending.  Will likely need outpatient follow-up with interventional radiology.  2.  E. coli UTI Patient noted to have a fever of 102.4 on 09/28/2017.  No CVA tenderness.  Chest x-ray obtained was concerning for left retrocardiac opacity.  Continue IV Rocephin and likely transition to oral antibiotic in the next 24 to 48 hours.  3.  Community-acquired pneumonia Per chest x-ray.  Patient currently afebrile.  Continue IV Rocephin and azithromycin.  If continued improvement and remains afebrile could likely transition to oral antibiotics in the next 24 to 48 hours.  4.  Hypertension Stable.  Continue Norvasc  and Toprol-XL.  5.  Chronic kidney disease stage III Stable.  6.  Paroxysmal atrial fibrillation/history of TIA Continue Toprol-XL for rate control.  Xarelto to be resumed this evening.  7.  COPD Stable.  8.  Status post recent right lower eyelid surgery resection of basal cell carcinoma Outpatient follow-up with ophthalmology.   DVT prophylaxis: Xarelto Code Status: Full Family Communication: Updated patient.  No family at bedside. Disposition Plan: Skilled nursing facility versus home with home health once pain is better controlled.   Consultants:   Interventional radiology Dr. Earleen Newport 09/29/2017  Procedures:   Chest x-ray 09/29/2017  MRI L-spine 09/27/2017  Status post L1 balloon kyphoplasty 09/30/2017 per Dr. Estanislado Pandy  Antimicrobials:   IV Rocephin 09/28/2017  IV azithromycin 09/30/2017   Subjective: Complaining of back spasms with turning and on ambulation to the bathroom.  Denies any chest pain no shortness of breath.  Patient feels some improvement since admission however not at baseline.  Objective: Vitals:   10/01/17 0017 10/01/17 0407 10/01/17 1621 10/01/17 2020  BP: (!) 149/90 (!) 157/64 (!) 126/95 139/64  Pulse: 65 (!) 48 62 64  Resp:  16    Temp: (!) 97.5 F (36.4 C) 97.6 F (36.4 C) 98.1 F (36.7 C) 97.7 F (36.5 C)  TempSrc: Axillary Oral Oral Oral  SpO2: 94% 96% 96% 95%  Weight:      Height:        Intake/Output Summary (Last 24 hours) at 10/01/2017 2118 Last data filed at 10/01/2017 1300 Gross per 24 hour  Intake 480 ml  Output 600 ml  Net -120 ml   Filed  Weights   09/26/17 1510 09/28/17 0500 09/29/17 0613  Weight: 70.3 kg (155 lb) 71.9 kg (158 lb 8.2 oz) 72.4 kg (159 lb 9.8 oz)    Examination:  General exam: Appears calm and comfortable  Respiratory system: Clear to auscultation. Respiratory effort normal. Cardiovascular system: S1 & S2 heard, RRR. No JVD, murmurs, rubs, gallops or clicks. No pedal edema. Gastrointestinal system:  Abdomen is nondistended, soft and nontender. No organomegaly or masses felt. Normal bowel sounds heard. Central nervous system: Alert and oriented. No focal neurological deficits. Extremities: Symmetric 5 x 5 power. Skin: No rashes, lesions or ulcers Psychiatry: Judgement and insight appear normal. Mood & affect appropriate.     Data Reviewed: I have personally reviewed following labs and imaging studies  CBC: Recent Labs  Lab 09/27/17 0349 09/27/17 0358 09/28/17 0359 09/29/17 0523 09/30/17 0440 10/01/17 0708  WBC 8.5  --  7.4 8.3 9.4 7.7  HGB 12.6 13.9 10.6* 11.3* 12.2 10.7*  HCT 41.0 41.0 34.6* 37.3 40.7 35.4*  MCV 94.3  --  94.0 94.0 94.7 95.2  PLT 395  --  351 414* 366 301   Basic Metabolic Panel: Recent Labs  Lab 09/27/17 0520 09/28/17 0359 09/29/17 0523 09/30/17 0440 10/01/17 0708  NA 140 139 142 141 142  K 4.4 4.3 4.2 4.6 4.2  CL 104 104 105 108 108  CO2 26 26 26 24 26   GLUCOSE 95 136* 92 86 86  BUN 17 27* 29* 26* 16  CREATININE 1.32* 1.54* 1.44* 1.34* 0.98  CALCIUM 9.2 8.5* 8.5* 8.6* 8.5*   GFR: Estimated Creatinine Clearance: 40.7 mL/min (by C-G formula based on SCr of 0.98 mg/dL). Liver Function Tests: No results for input(s): AST, ALT, ALKPHOS, BILITOT, PROT, ALBUMIN in the last 168 hours. No results for input(s): LIPASE, AMYLASE in the last 168 hours. No results for input(s): AMMONIA in the last 168 hours. Coagulation Profile: Recent Labs  Lab 09/30/17 1154  INR 1.01   Cardiac Enzymes: No results for input(s): CKTOTAL, CKMB, CKMBINDEX, TROPONINI in the last 168 hours. BNP (last 3 results) No results for input(s): PROBNP in the last 8760 hours. HbA1C: No results for input(s): HGBA1C in the last 72 hours. CBG: No results for input(s): GLUCAP in the last 168 hours. Lipid Profile: No results for input(s): CHOL, HDL, LDLCALC, TRIG, CHOLHDL, LDLDIRECT in the last 72 hours. Thyroid Function Tests: No results for input(s): TSH, T4TOTAL, FREET4,  T3FREE, THYROIDAB in the last 72 hours. Anemia Panel: No results for input(s): VITAMINB12, FOLATE, FERRITIN, TIBC, IRON, RETICCTPCT in the last 72 hours. Sepsis Labs: Recent Labs  Lab 09/29/17 1427 09/30/17 0440  PROCALCITON <0.10 <0.10    Recent Results (from the past 240 hour(s))  Urine culture     Status: Abnormal   Collection Time: 09/27/17  5:32 AM  Result Value Ref Range Status   Specimen Description URINE, CLEAN CATCH  Final   Special Requests   Final    Normal Performed at [facility], [place] [address] [place], [place] [address]    Culture >=100,000 COLONIES/mL ESCHERICHIA COLI (A)  Final   Report Status 09/29/2017 FINAL  Final   Organism ID, Bacteria ESCHERICHIA COLI (A)  Final      Susceptibility   Escherichia coli - MIC*    AMPICILLIN <=2 SENSITIVE Sensitive     CEFAZOLIN <=4 SENSITIVE Sensitive     CEFTRIAXONE <=1 SENSITIVE Sensitive     CIPROFLOXACIN <=0.25 SENSITIVE Sensitive     GENTAMICIN <=1 SENSITIVE Sensitive  IMIPENEM <=0.25 SENSITIVE Sensitive     NITROFURANTOIN <=16 SENSITIVE Sensitive     TRIMETH/SULFA <=20 SENSITIVE Sensitive     AMPICILLIN/SULBACTAM <=2 SENSITIVE Sensitive     PIP/TAZO <=4 SENSITIVE Sensitive     Extended ESBL NEGATIVE Sensitive     * >=100,000 COLONIES/mL ESCHERICHIA COLI  Culture, blood (Routine X 2) w Reflex to ID Panel     Status: None (Preliminary result)   Collection Time: 09/29/17  2:18 PM  Result Value Ref Range Status   Specimen Description BLOOD RIGHT ANTECUBITAL  Final   Special Requests   Final    BOTTLES DRAWN AEROBIC ONLY Blood Culture results may not be optimal due to an inadequate volume of blood received in culture bottles   Culture   Final    NO GROWTH 2 DAYS Performed at [place] [address], [place], [place] [address]    Report Status PENDING  Incomplete  Culture, blood (Routine X 2) w Reflex to ID Panel     Status: None (Preliminary result)   Collection Time: 09/29/17  2:22 PM    Result Value Ref Range Status   Specimen Description BLOOD RIGHT ANTECUBITAL  Final   Special Requests   Final    BOTTLES DRAWN AEROBIC ONLY Blood Culture results may not be optimal due to an inadequate volume of blood received in culture bottles   Culture   Final    NO GROWTH 2 DAYS Performed at [facility], [place] [address] [place], [place] [address]    Report Status PENDING  Incomplete  Surgical pcr screen     Status: None   Collection Time: 09/30/17  9:07 AM  Result Value Ref Range Status   MRSA, PCR NEGATIVE NEGATIVE Final   Staphylococcus aureus NEGATIVE NEGATIVE Final    Comment: (NOTE) The Xpert SA Assay (FDA approved for NASAL specimens in patients 57 years of age and older), is one component of a comprehensive surveillance program. It is not intended to diagnose infection nor to guide or monitor treatment. Performed at [facility], [place] [address], [place], [place] [address]          Radiology Studies: No results found.      Scheduled Meds: . amLODipine  5 mg Oral Daily  . atorvastatin  80 mg Oral QHS  . estrogens (conjugated)  0.3 mg Oral QPM  . gabapentin  100 mg Oral QHS  . metoprolol succinate  75 mg Oral QPM  . pantoprazole  40 mg Oral q morning - 10a  . predniSONE  5 mg Oral Q breakfast  . rivaroxaban  15 mg Oral Q supper   Continuous Infusions: . azithromycin Stopped (10/01/17 1131)  . cefTRIAXone (ROCEPHIN)  IV Stopped (10/01/17 1021)     LOS: 3 days    Time spent: 40 minutes    Irine Seal, MD [facility] Pager [phone]  If 7PM-7AM, please contact night-coverage [email or web address] Password [facility] 10/01/2017, 9:18 PM

## 2017-10-01 NOTE — NC FL2 (Signed)
[place] LEVEL OF CARE SCREENING TOOL     IDENTIFICATION  Patient Name: Brenda Hall Birthdate: 1930-11-20 Sex: female Admission Date (Current Location): 09/26/2017  [employer] and [place] Number:  [employer] and Address:  [facility] [place], [place] [address] [place], [place] [address]      Provider Number: [id number]  Attending Physician Name and Address:  Eugenie Filler, MD  Relative Name and Phone Number:  Charlesetta Garibaldi, son, [phone]    Current Level of Care: Hospital Recommended Level of Care: [place] Prior Approval Number:    Date Approved/Denied:   PASRR Number: [id number]  Discharge Plan: SNF    Current Diagnoses: Patient Active Problem List   Diagnosis Date Noted  . L1 vertebral fracture ([place]) 09/27/2017  . UTI (urinary tract infection) 09/27/2017  . CKD (chronic kidney disease), stage III ([place])   . History of TIA (transient ischemic attack)   . PAF (paroxysmal atrial fibrillation) ([place])   . Atrial fibrillation with rapid ventricular response ([place]) 02/12/2017  . Hyperlipidemia 02/12/2017  . Pulmonary edema 02/12/2017  . Urgency of micturition 02/12/2017  . Osteopenia of multiple sites 07/23/2016  . Age-related osteoporosis without current pathological fracture 07/11/2016  . History of total knee replacement, bilateral 07/11/2016  . History of COPD 07/11/2016  . Spondylosis of lumbar region without myelopathy or radiculopathy 07/11/2016  . Primary osteoarthritis of both hands 07/11/2016  . Primary osteoarthritis of both feet 07/11/2016  . Vitamin D deficiency 07/11/2016  . History of vertebral fracture 07/11/2016  . History of IBS 07/11/2016  . Basal cell carcinoma 07/11/2016  . History of CVA (cerebrovascular accident)/ has metal implant not MRI safe  07/11/2016  . Other and unspecified hyperlipidemia 11/19/2012  . Cerebrovascular accident (stroke) ([place]) 11/19/2012  . Difficulty in  walking(719.7) 11/06/2012  . Occlusion and stenosis of carotid artery without mention of cerebral infarction 02/07/2012  . OLD MYOCARDIAL INFARCTION 11/07/2009  . CAD, NATIVE VESSEL 11/07/2009  . Essential hypertension 09/28/2008  . IRRITABLE BOWEL SYNDROME 09/28/2008  . DEGENERATIVE JOINT DISEASE, KNEE 09/28/2008  . POLYMYALGIA RHEUMATICA 09/28/2008  . Chest pain, unspecified 09/28/2008    Orientation RESPIRATION BLADDER Height & Weight     Self, Time, Situation, Place  Normal Continent Weight: 159 lb 9.8 oz (72.4 kg) Height:  5\' 3"  (160 cm)  BEHAVIORAL SYMPTOMS/MOOD NEUROLOGICAL BOWEL NUTRITION STATUS      Continent Diet(See DC Summary)  AMBULATORY STATUS COMMUNICATION OF NEEDS Skin   Limited Assist Verbally Surgical wounds                       Personal Care Assistance Level of Assistance  Dressing, Feeding, Bathing Bathing Assistance: Maximum assistance Feeding assistance: Limited assistance Dressing Assistance: Maximum assistance     Functional Limitations Info  Sight, Hearing, Speech Sight Info: Adequate Hearing Info: Adequate Speech Info: Adequate    SPECIAL CARE FACTORS FREQUENCY  PT (By licensed PT), OT (By licensed OT)     PT Frequency: 2x week OT Frequency: 2x week            Contractures      Additional Factors Info  Code Status, Allergies Code Status Info: Full  Allergies Info: EPINEPHRINE, CLOPIDOGREL BISULFATE, OXYCODONE            Current Medications (10/01/2017):  This is the current hospital active medication list Current Facility-Administered Medications  Medication Dose Route Frequency Provider Last Rate Last Dose  . acetaminophen (  TYLENOL) tablet 650 mg  650 mg Oral Q6H PRN Radene Gunning, NP   650 mg at 10/01/17 0028   Or  . acetaminophen (TYLENOL) suppository 650 mg  650 mg Rectal Q6H PRN Radene Gunning, NP      . amLODipine (NORVASC) tablet 5 mg  5 mg Oral Daily Radene Gunning, NP   5 mg at 10/01/17 0846  . atorvastatin  (LIPITOR) tablet 80 mg  80 mg Oral QHS Radene Gunning, NP   80 mg at 09/30/17 2129  . azithromycin (ZITHROMAX) 500 mg in sodium chloride 0.9 % 250 mL IVPB  500 mg Intravenous Q24H Elgergawy, Silver Huguenin, MD   Stopped at 10/01/17 1131  . cefTRIAXone (ROCEPHIN) 1 g in sodium chloride 0.9 % 100 mL IVPB  1 g Intravenous Q24H Radene Gunning, NP   Stopped at 10/01/17 1021  . estrogens (conjugated) (PREMARIN) tablet 0.3 mg  0.3 mg Oral QPM Radene Gunning, NP   0.3 mg at 09/30/17 1835  . fluticasone (FLONASE) 50 MCG/ACT nasal spray 1 spray  1 spray Each Nare Daily PRN Radene Gunning, NP      . gabapentin (NEURONTIN) capsule 100 mg  100 mg Oral QHS Radene Gunning, NP   100 mg at 09/30/17 2129  . methocarbamol (ROBAXIN) tablet 500 mg  500 mg Oral Q6H PRN Radene Gunning, NP   500 mg at 10/01/17 1357  . metoprolol succinate (TOPROL-XL) 24 hr tablet 75 mg  75 mg Oral QPM Radene Gunning, NP   75 mg at 09/30/17 1835  . morphine 2 MG/ML injection 1-2 mg  1-2 mg Intravenous Q4H PRN Elgergawy, Silver Huguenin, MD   2 mg at 10/01/17 1358  . nitroGLYCERIN (NITROSTAT) SL tablet 0.4 mg  0.4 mg Sublingual Q5 min PRN Black, Lezlie Octave, NP      . ondansetron [facility]) tablet 4 mg  4 mg Oral Q6H PRN Radene Gunning, NP       Or  . ondansetron [facility]) injection 4 mg  4 mg Intravenous Q6H PRN Black, Lezlie Octave, NP      . pantoprazole (PROTONIX) EC tablet 40 mg  40 mg Oral q morning - 10a Radene Gunning, NP   40 mg at 10/01/17 0846  . predniSONE (DELTASONE) tablet 5 mg  5 mg Oral Q breakfast Elgergawy, Silver Huguenin, MD   5 mg at 10/01/17 0846  . Rivaroxaban (XARELTO) tablet 15 mg  15 mg Oral Q supper Eugenie Filler, MD      . senna-docusate (Senokot-S) tablet 1 tablet  1 tablet Oral QHS PRN Renard Hamper Lezlie Octave, NP         Discharge Medications: Please see discharge summary for a list of discharge medications.  Relevant Imaging Results:  Relevant Lab Results:   Additional Information SS#: [place], LCSW

## 2017-10-01 NOTE — Evaluation (Signed)
Physical Therapy Evaluation Patient Details Name: Brenda Hall MRN: [id number] DOB: 03/27/1931 Today's Date: 10/01/2017   History of Present Illness  Pt is an 84 y.o. female with medical history significant for hypertension, CAD, MI 2007, atrial fibrillation, carotid stenosis, hyperlipidemia, TIA, CVA, severe osteoporosis, osteoarthritis, polymyalgia rheumatica and lumbar 2 compression fracture s/p vertebroplasty in March, T12 vertebroplasty in May. She presented to the ED from interventional radiology with intractable back pain. MRI revealed acute L1 fracture. She is now s/p L1 balloon KP 6/11.     Clinical Impression  Pt presented supine in bed with HOB elevated, awake and willing to participate in therapy session. Prior to admission, pt reported that she ambulated with use of rollator. Pt currently limited secondary to pain. She required min guard for transfers and ambulated a short distance with rollator and min guard for safety. Pt would continue to benefit from skilled physical therapy services at this time while admitted and after d/c to address the below listed limitations in order to improve overall safety and independence with functional mobility.     Follow Up Recommendations SNF;Other (comment)(unless pt can arrange for 24/7 supervision at home) - Pt would benefit from a program like Home First    Equipment Recommendations  None recommended by PT    Recommendations for Other Services       Precautions / Restrictions Precautions Precautions: Back Precaution Booklet Issued: No Precaution Comments: PT reviewed 3/3 back precautions with pt throughout Restrictions Weight Bearing Restrictions: No      Mobility  Bed Mobility Overal bed mobility: Needs Assistance Bed Mobility: Rolling;Sidelying to Sit Rolling: Min assist Sidelying to sit: Mod assist       General bed mobility comments: pt OOB in recliner chair  Transfers Overall transfer level: Needs  assistance Equipment used: Rolling walker (2 wheeled) Transfers: Sit to/from Stand Sit to Stand: Min guard         General transfer comment: increased time and effort, min guard for safety  Ambulation/Gait Ambulation/Gait assistance: Min guard Ambulation Distance (Feet): 40 Feet Assistive device: 4-wheeled walker Gait Pattern/deviations: Step-through pattern;Decreased stride length Gait velocity: decreased Gait velocity interpretation: <1.31 ft/sec, indicative of household ambulator General Gait Details: mild instability but no overt LOB or need for physical assistance, min guard for safety. Pt limited secondary to pain  Stairs            Wheelchair Mobility    Modified Rankin (Stroke Patients Only)       Balance Overall balance assessment: Needs assistance Sitting-balance support: No upper extremity supported;Feet supported Sitting balance-Leahy Scale: Fair Sitting balance - Comments: BUE support for comfort but able to sustain without this   Standing balance support: Bilateral upper extremity supported;No upper extremity supported Standing balance-Leahy Scale: Poor Standing balance comment: Able to stand wtihout UE support at sink for ADL.                              Pertinent Vitals/Pain Pain Assessment: 0-10 Pain Score: 7  Pain Location: lumbar spine Pain Descriptors / Indicators: Aching;Operative site guarding;Sore Pain Intervention(s): Monitored during session;Repositioned    Home Living Family/patient expects to be discharged to:: Private residence Living Arrangements: Other relatives(grandchild) Available Help at Discharge: Family;Available PRN/intermittently Type of Home: House Home Access: Stairs to enter Entrance Stairs-Rails: Psychiatric nurse of Steps: 4 Home Layout: One level Home Equipment: Walker - 4 wheels;Shower seat;Hand held shower head Additional Comments: Pt reporting that her  grandson lives in basement  and is there overnight and available by phone.     Prior Function Level of Independence: Needs assistance   Gait / Transfers Assistance Needed: Uses rollator  ADL's / Homemaking Assistance Needed: Independent for basic ADL        Hand Dominance        Extremity/Trunk Assessment   Upper Extremity Assessment Upper Extremity Assessment: Generalized weakness    Lower Extremity Assessment Lower Extremity Assessment: Generalized weakness    Cervical / Trunk Assessment Cervical / Trunk Assessment: Other exceptions Cervical / Trunk Exceptions: s/p lumbar sx  Communication   Communication: No difficulties  Cognition Arousal/Alertness: Awake/alert Behavior During Therapy: WFL for tasks assessed/performed Overall Cognitive Status: No family/caregiver present to determine baseline cognitive functioning Area of Impairment: Memory                     Memory: Decreased short-term memory         General Comments: Pt with difficulty remembering what she was doing once she ambulated to the sink (she had stated that she intended to brush her teeth).      General Comments General comments (skin integrity, edema, etc.): Notified RNCM that pt may benefit from Home First program if able to qualify and she reports that she will look into this.    Exercises     Assessment/Plan    PT Assessment Patient needs continued PT services  PT Problem List Decreased strength;Decreased range of motion;Decreased activity tolerance;Decreased balance;Decreased mobility;Decreased coordination;Decreased knowledge of use of DME;Decreased safety awareness;Decreased knowledge of precautions;Cardiopulmonary status limiting activity;Pain       PT Treatment Interventions DME instruction;Gait training;Stair training;Functional mobility training;Therapeutic activities;Therapeutic exercise;Balance training;Neuromuscular re-education;Patient/family education    PT Goals (Current goals can be found in  the Care Plan section)  Acute Rehab PT Goals Patient Stated Goal: decrease pain PT Goal Formulation: With patient Time For Goal Achievement: 10/15/17 Potential to Achieve Goals: Good    Frequency Min 5X/week   Barriers to discharge        Co-evaluation               AM-PAC PT "6 Clicks" Daily Activity  Outcome Measure Difficulty turning over in bed (including adjusting bedclothes, sheets and blankets)?: A Lot Difficulty moving from lying on back to sitting on the side of the bed? : A Lot Difficulty sitting down on and standing up from a chair with arms (e.g., wheelchair, bedside commode, etc,.)?: Unable Help needed moving to and from a bed to chair (including a wheelchair)?: A Little Help needed walking in hospital room?: A Little Help needed climbing 3-5 steps with a railing? : A Little 6 Click Score: 14    End of Session Equipment Utilized During Treatment: Gait belt Activity Tolerance: Patient limited by pain Patient left: in chair;with call bell/phone within reach Nurse Communication: Mobility status PT Visit Diagnosis: Other abnormalities of gait and mobility (R26.89);Pain Pain - part of body: (back)    Time: 1459-1536 PT Time Calculation (min) (ACUTE ONLY): 37 min   Charges:   PT Evaluation $PT Eval Moderate Complexity: 1 Mod PT Treatments $Therapeutic Activity: 8-22 mins   PT G Codes:        Buchanan, PT, DPT [place] 10/01/2017, 4:23 PM

## 2017-10-01 NOTE — Progress Notes (Signed)
Referring Physician(s): Dr Louanne Skye  Supervising Physician: Luanne Bras  Patient Status:  [name] - In-pt  Chief Complaint:  Back pain L1 fracture  Subjective:  S/P L1 balloon KP 6/11 Pt still sore Resting well and without pain When asked to roll to see site-- became painful again-- maybe 7/10    Allergies: Epinephrine; Clopidogrel bisulfate; and Oxycodone  Medications: Prior to Admission medications   Medication Sig Start Date End Date Taking? Authorizing Provider  acetaminophen (TYLENOL) 500 MG tablet Take 500 mg by mouth every 6 (six) hours as needed for mild pain.   Yes [provider]  amLODipine (NORVASC) 5 MG tablet Take 5 mg by mouth daily.   Yes [provider]  aspirin 81 MG tablet Take 81 mg by mouth daily.   Yes [provider]  atorvastatin (LIPITOR) 80 MG tablet Take 1 tablet (80 mg total) by mouth at bedtime. 05/29/17  Yes [name], MD  Calcium Carbonate-Vit D-Min (CALCIUM 1200 PO) Take 1 tablet by mouth at bedtime.    Yes [provider]  estrogens, conjugated, (PREMARIN) 0.3 MG tablet Take 1 tablet (0.3 mg total) by mouth every evening. 02/14/17  Yes Johnson, Clanford L, MD  fluticasone (FLONASE) 50 MCG/ACT nasal spray USE TWO SPRAYS IN EACH NOSTRIL ONCE DAILY Patient taking differently: Place 1 spray into both nostrils daily as needed for allergies. USE TWO SPRAYS IN EACH NOSTRIL ONCE DAILY 12/03/16  Yes [name], MD  furosemide (LASIX) 20 MG tablet Take 20 mg DAILY AS NEEDED FOR SWELLING. Patient taking differently: Take 20 mg by mouth daily as needed for fluid. Take 20 mg DAILY AS NEEDED FOR SWELLING. 09/02/17  Yes BranchAlphonse Guild, MD  loperamide (IMODIUM) 2 MG capsule Take 2 mg by mouth daily as needed for diarrhea or loose stools.    Yes [provider]  methocarbamol (ROBAXIN) 500 MG tablet Take 500 mg by mouth every 8 (eight) hours as needed for muscle spasms.   Yes [provider]  metoprolol succinate (TOPROL-XL) 50 MG 24 hr tablet Take 1 and 1/2 Tablets At Night Patient taking differently: Take 75 mg by mouth every evening. Take 1 and 1/2 Tablets At Night 04/17/17  Yes Lendon Colonel, NP  nitroGLYCERIN (NITROSTAT) 0.4 MG SL tablet Place 1 tablet (0.4 mg total) under the tongue every 5 (five) minutes as needed for chest pain. 12/03/16  Yes [name], MD  pantoprazole (PROTONIX) 40 MG tablet TAKE 1 TABLET EVERY MORNING 04/21/17  Yes Deveshwar, Abel Presto, MD  predniSONE (DELTASONE) 5 MG tablet TAKE 1 TABLET BY MOUTH DAILY WITH BREAKFAST(TAPER BY 1 MG EVERY MONTH) Patient taking differently: TAKE 6 MG TABLET BY MOUTH DAILY WITH BREAKFAST(TAPER BY 1 MG EVERY MONTH UNTIL COMPLETELY OFF) 09/18/17  Yes Deveshwar, Abel Presto, MD  Zinc 50 MG CAPS Take 50 mg by mouth daily.   Yes [provider]  gabapentin (NEURONTIN) 100 MG capsule Take 1 capsule (100 mg total) by mouth at bedtime. Patient not taking: Reported on 09/27/2017 08/27/17   Jessy Oto, MD  XARELTO 15 MG TABS tablet TAKE 1 TABLET DAILY WITH SUPPER 09/30/17   Arnoldo Lenis, MD     Vital Signs: BP (!) 157/64 (BP Location: Right Arm)   Pulse (!) 48   Temp 97.6 F (36.4 C) (Oral)   Resp 16   Ht 5\' 3"  (1.6 m)   Wt 159 lb 9.8 oz (72.4 kg)   SpO2 96%   BMI  28.27 kg/m   Physical Exam  Musculoskeletal: Normal range of motion.  Sore/back pain to roll in bed  Neurological: She is alert.  Skin: Skin is warm and dry.  Site is clean and dry NT no bleeding No hematoma Moves all 4s  Psychiatric: She has a normal mood and affect. Her behavior is normal.  Nursing note and vitals reviewed.   Imaging: Dg Chest Port 1 View  Result Date: 09/29/2017 CLINICAL DATA:  84 year old female with a history of fever EXAM: PORTABLE CHEST 1 VIEW COMPARISON:  02/13/2017, 02/12/2017 FINDINGS: Cardiomediastinal silhouette unchanged in size and contour. Patchy opacity in the retrocardiac region with partial  obscuration left hemidiaphragm. No large pleural effusion. Coarsened interstitial markings bilaterally. No pneumothorax. No acute displaced fracture IMPRESSION: Retrocardiac patchy opacity, may represent developing infection or chronic changes. Electronically Signed   By: Corrie Mckusick D.O.   On: 09/29/2017 15:51    Labs:  CBC: Recent Labs    09/27/17 0349 09/27/17 0358 09/28/17 0359 09/29/17 0523 09/30/17 0440  WBC 8.5  --  7.4 8.3 9.4  HGB 12.6 13.9 10.6* 11.3* 12.2  HCT 41.0 41.0 34.6* 37.3 40.7  PLT 395  --  351 414* 366    COAGS: Recent Labs    09/05/17 1116 09/30/17 1154  INR 0.98 1.01  APTT  --  23*    BMP: Recent Labs    09/27/17 0520 09/28/17 0359 09/29/17 0523 09/30/17 0440  NA 140 139 142 141  K 4.4 4.3 4.2 4.6  CL 104 104 105 108  CO2 26 26 26 24   GLUCOSE 95 136* 92 86  BUN 17 27* 29* 26*  CALCIUM 9.2 8.5* 8.5* 8.6*  CREATININE 1.32* 1.54* 1.44* 1.34*  GFRNONAA 36* 30* 32* 35*  GFRAA 42* 35* 37* 41*    LIVER FUNCTION TESTS: Recent Labs    12/04/16 0939 05/31/17 0858  BILITOT 0.5 0.4  AST 28 14  ALT 17 11  ALKPHOS 56 67  PROT 7.3 7.1  ALBUMIN 4.1 3.8    Assessment and Plan:  Lumbar 1 KP 6/11 Slight reduction in pain; still sore Will see Dr Estanislado Pandy in 2 weeks for follow up--- she will hear from scheduler for time and date  Electronically Signed: Kendelle Schweers A, PA-C 10/01/2017, 7:35 AM   I spent a total of 25 Minutes at the the patient's bedside AND on the patient's hospital floor or unit, greater than 50% of which was counseling/coordinating care for L1 KP    Patient ID: Brenda Hall, female   DOB: 26-Jan-1931, 84 y.o.   MRN: [id number]

## 2017-10-01 NOTE — Clinical Social Work Note (Signed)
Clinical Social Work Assessment  Patient Details  Name: Brenda Hall MRN: [id number] Date of Birth: 1931/04/09  Date of referral:  10/01/17               Reason for consult:  Facility Placement                Permission sought to share information with:  Facility Art therapist granted to share information::  Yes, Verbal Permission Granted  Name::        Agency::  SNF  Relationship::      Contact Information:     Housing/Transportation Living arrangements for the past 2 months:  Single Family Home Source of Information:  Patient Patient Interpreter Needed:  None Criminal Activity/Legal Involvement Pertinent to Current Situation/Hospitalization:  No - Comment as needed Significant Relationships:  Adult Children, Other Family Members Lives with:  Relatives, Self, Pets Do you feel safe going back to the place where you live?  No Need for family participation in patient care:  No (Coment)  Care giving concerns:  Pt from home alone and will need skilled nursing at discharge.  Social Worker assessment / plan:  CSW met with patient at bedside to discuss the disposition.  Pt indicated that she resides alone and using a walker to ambulate prior to hospitalization. She indicated that her grandson resides with her, but he works and is only home most evenings.  She is concerned about not having enough support at home given her pain. CSW explained the SNF process and placement. Pt gave permission to send to referrals in [place] and would prefer [facility] as it is close to her home. CSW discussed possible barriers at [facility] often times is full and unable to provide available bed.   CSW will f/u for disposition.  Employment status:  Retired Forensic scientist:  Medicare PT Recommendations:  [place] / Referral to community resources:  [place]  Patient/Family's Response to care:  Pt appreciative of CSW  assistance.  Patient/Family's Understanding of and Emotional Response to Diagnosis, Current Treatment, and Prognosis:  Pt has good understanding of her diagnosis and need for skilled support. She indicated that with the prior lumbar surgery she went home prematurely and experienced excruciating pain that left her in a chair most days. Pt agreeable to SNF and hopes to have better rehabilitation before returning home to independence.  Emotional Assessment Appearance:  Appears stated age Attitude/Demeanor/Rapport:  (Cooperative) Affect (typically observed):  Accepting, Appropriate Orientation:  Oriented to Situation, Oriented to  Time, Oriented to Place, Oriented to Self Alcohol / Substance use:  Not Applicable Psych involvement (Current and /or in the community):  No (Comment)  Discharge Needs  Concerns to be addressed:  Discharge Planning Concerns Readmission within the last 30 days:  No Current discharge risk:  Dependent with Mobility, Physical Impairment Barriers to Discharge:  No Barriers Identified   Normajean Baxter, LCSW 10/01/2017, 4:19 PM

## 2017-10-01 NOTE — Progress Notes (Signed)
Pt c/o severe back spasm. Md notified. N.O received. Pt medicated.

## 2017-10-02 ENCOUNTER — Encounter (HOSPITAL_COMMUNITY): Payer: Self-pay | Admitting: Interventional Radiology

## 2017-10-02 ENCOUNTER — Other Ambulatory Visit: Payer: Self-pay

## 2017-10-02 ENCOUNTER — Ambulatory Visit: Payer: Medicare Other | Admitting: Rheumatology

## 2017-10-02 DIAGNOSIS — R2681 Unsteadiness on feet: Secondary | ICD-10-CM | POA: Diagnosis not present

## 2017-10-02 DIAGNOSIS — N183 Chronic kidney disease, stage 3 (moderate): Secondary | ICD-10-CM | POA: Diagnosis not present

## 2017-10-02 DIAGNOSIS — M545 Low back pain, unspecified: Secondary | ICD-10-CM

## 2017-10-02 DIAGNOSIS — S32010A Wedge compression fracture of first lumbar vertebra, initial encounter for closed fracture: Secondary | ICD-10-CM | POA: Diagnosis not present

## 2017-10-02 DIAGNOSIS — B9620 Unspecified Escherichia coli [E. coli] as the cause of diseases classified elsewhere: Secondary | ICD-10-CM | POA: Diagnosis not present

## 2017-10-02 DIAGNOSIS — I480 Paroxysmal atrial fibrillation: Secondary | ICD-10-CM | POA: Diagnosis not present

## 2017-10-02 DIAGNOSIS — B9629 Other Escherichia coli [E. coli] as the cause of diseases classified elsewhere: Secondary | ICD-10-CM | POA: Diagnosis not present

## 2017-10-02 DIAGNOSIS — N390 Urinary tract infection, site not specified: Secondary | ICD-10-CM

## 2017-10-02 DIAGNOSIS — Z8679 Personal history of other diseases of the circulatory system: Secondary | ICD-10-CM | POA: Diagnosis not present

## 2017-10-02 DIAGNOSIS — I10 Essential (primary) hypertension: Secondary | ICD-10-CM | POA: Diagnosis not present

## 2017-10-02 DIAGNOSIS — M2550 Pain in unspecified joint: Secondary | ICD-10-CM | POA: Diagnosis not present

## 2017-10-02 DIAGNOSIS — M353 Polymyalgia rheumatica: Secondary | ICD-10-CM | POA: Diagnosis not present

## 2017-10-02 DIAGNOSIS — Z7401 Bed confinement status: Secondary | ICD-10-CM | POA: Diagnosis not present

## 2017-10-02 DIAGNOSIS — J449 Chronic obstructive pulmonary disease, unspecified: Secondary | ICD-10-CM | POA: Diagnosis not present

## 2017-10-02 DIAGNOSIS — M549 Dorsalgia, unspecified: Secondary | ICD-10-CM | POA: Diagnosis not present

## 2017-10-02 DIAGNOSIS — M6281 Muscle weakness (generalized): Secondary | ICD-10-CM | POA: Diagnosis not present

## 2017-10-02 DIAGNOSIS — Z4789 Encounter for other orthopedic aftercare: Secondary | ICD-10-CM | POA: Diagnosis not present

## 2017-10-02 DIAGNOSIS — S32019A Unspecified fracture of first lumbar vertebra, initial encounter for closed fracture: Secondary | ICD-10-CM | POA: Diagnosis not present

## 2017-10-02 DIAGNOSIS — Z8673 Personal history of transient ischemic attack (TIA), and cerebral infarction without residual deficits: Secondary | ICD-10-CM | POA: Diagnosis not present

## 2017-10-02 DIAGNOSIS — J189 Pneumonia, unspecified organism: Secondary | ICD-10-CM | POA: Diagnosis not present

## 2017-10-02 LAB — POTASSIUM: POTASSIUM: 4.3 mmol/L (ref 3.5–5.1)

## 2017-10-02 LAB — CBC
HEMATOCRIT: 37 % (ref 36.0–46.0)
HEMOGLOBIN: 11.2 g/dL — AB (ref 12.0–15.0)
MCH: 28.7 pg (ref 26.0–34.0)
MCHC: 30.3 g/dL (ref 30.0–36.0)
MCV: 94.9 fL (ref 78.0–100.0)
Platelets: 354 10*3/uL (ref 150–400)
RBC: 3.9 MIL/uL (ref 3.87–5.11)
RDW: 14.2 % (ref 11.5–15.5)
WBC: 9.7 10*3/uL (ref 4.0–10.5)

## 2017-10-02 LAB — BASIC METABOLIC PANEL
Anion gap: 7 (ref 5–15)
BUN: 17 mg/dL (ref 6–20)
CHLORIDE: 105 mmol/L (ref 101–111)
CO2: 29 mmol/L (ref 22–32)
CREATININE: 1.13 mg/dL — AB (ref 0.44–1.00)
Calcium: 8.8 mg/dL — ABNORMAL LOW (ref 8.9–10.3)
GFR calc Af Amer: 50 mL/min — ABNORMAL LOW (ref >60)
GFR calc non Af Amer: 43 mL/min — ABNORMAL LOW (ref >60)
GLUCOSE: 94 mg/dL (ref 65–99)
POTASSIUM: 5.4 mmol/L — AB (ref 3.5–5.1)
SODIUM: 141 mmol/L (ref 135–145)

## 2017-10-02 MED ORDER — SENNOSIDES-DOCUSATE SODIUM 8.6-50 MG PO TABS: 1.0000 | ORAL_TABLET | Freq: Every evening | ORAL | Status: DC | PRN

## 2017-10-02 MED ORDER — METHOCARBAMOL 500 MG PO TABS: 1000.0000 mg | ORAL_TABLET | Freq: Three times a day (TID) | ORAL | 0 refills | Status: DC | PRN

## 2017-10-02 MED ORDER — LIDOCAINE 5 % EX PTCH: 1.0000 | MEDICATED_PATCH | CUTANEOUS | 0 refills | Status: DC

## 2017-10-02 MED ORDER — TRAMADOL HCL 50 MG PO TABS: 50.0000 mg | ORAL_TABLET | Freq: Four times a day (QID) | ORAL | 0 refills | Status: DC | PRN

## 2017-10-02 MED ORDER — AMOXICILLIN-POT CLAVULANATE 875-125 MG PO TABS: 1.0000 | ORAL_TABLET | Freq: Two times a day (BID) | ORAL | 0 refills | Status: AC

## 2017-10-02 NOTE — Care Management Important Message (Signed)
Important Message  Patient Details  Name: Brenda Hall MRN: [id number] Date of Birth: Quinci 21, 1932   Medicare Important Message Given:  Yes    Orbie Pyo 10/02/2017, 8:34 AM

## 2017-10-02 NOTE — Progress Notes (Signed)
Physical Therapy Treatment Patient Details Name: Brenda Hall MRN: [id number] DOB: December 12, 1930 Today's Date: 10/02/2017    History of Present Illness Pt is an 84 y.o. female with medical history significant for hypertension, CAD, MI 2007, atrial fibrillation, carotid stenosis, hyperlipidemia, TIA, CVA, severe osteoporosis, osteoarthritis, polymyalgia rheumatica and lumbar 2 compression fracture s/p vertebroplasty in March, T12 vertebroplasty in May. She presented to the ED from interventional radiology with intractable back pain. MRI revealed acute L1 fracture. She is now s/p L1 balloon KP 6/11.     PT Comments    Pt remains very limited secondary to back pain. She tolerated short distance ambulation within her room with use of rollator and min guard for safety. Pt would continue to benefit from skilled physical therapy services at this time while admitted and after d/c to address the below listed limitations in order to improve overall safety and independence with functional mobility.    Follow Up Recommendations  SNF     Equipment Recommendations  None recommended by PT    Recommendations for Other Services       Precautions / Restrictions Precautions Precautions: Back Precaution Booklet Issued: No Restrictions Weight Bearing Restrictions: No    Mobility  Bed Mobility Overal bed mobility: Needs Assistance Bed Mobility: Rolling;Sidelying to Sit Rolling: Supervision Sidelying to sit: Supervision       General bed mobility comments: supervision for safety  Transfers Overall transfer level: Needs assistance Equipment used: 4-wheeled walker Transfers: Sit to/from Stand Sit to Stand: Min guard         General transfer comment: increased time and effort, min guard for safety  Ambulation/Gait Ambulation/Gait assistance: Min guard Gait Distance (Feet): 40 Feet Assistive device: 4-wheeled walker Gait Pattern/deviations: Step-through pattern;Decreased stride  length Gait velocity: decreased Gait velocity interpretation: <1.31 ft/sec, indicative of household ambulator General Gait Details: mild instability but no overt LOB or need for physical assistance, min guard for safety. Pt limited secondary to pain   Stairs             Wheelchair Mobility    Modified Rankin (Stroke Patients Only)       Balance Overall balance assessment: Needs assistance Sitting-balance support: No upper extremity supported;Feet supported Sitting balance-Leahy Scale: Fair     Standing balance support: Bilateral upper extremity supported;No upper extremity supported;Single extremity supported Standing balance-Leahy Scale: Poor                              Cognition Arousal/Alertness: Awake/alert Behavior During Therapy: WFL for tasks assessed/performed Overall Cognitive Status: Within Functional Limits for tasks assessed                                        Exercises      General Comments        Pertinent Vitals/Pain Pain Assessment: Faces Faces Pain Scale: Hurts even more Pain Location: lumbar spine Pain Descriptors / Indicators: Aching;Operative site guarding;Sore Pain Intervention(s): Monitored during session;Repositioned    Home Living Family/patient expects to be discharged to:: Other (Comment)(Rehab) Living Arrangements: Alone                  Prior Function            PT Goals (current goals can now be found in the care plan section) Acute Rehab PT Goals PT Goal Formulation: With  patient Time For Goal Achievement: 10/15/17 Potential to Achieve Goals: Good Progress towards PT goals: Progressing toward goals    Frequency    Min 5X/week      PT Plan Current plan remains appropriate    Co-evaluation              AM-PAC PT "6 Clicks" Daily Activity  Outcome Measure  Difficulty turning over in bed (including adjusting bedclothes, sheets and blankets)?: A Little Difficulty  moving from lying on back to sitting on the side of the bed? : A Little Difficulty sitting down on and standing up from a chair with arms (e.g., wheelchair, bedside commode, etc,.)?: Unable Help needed moving to and from a bed to chair (including a wheelchair)?: A Little Help needed walking in hospital room?: A Little Help needed climbing 3-5 steps with a railing? : A Little 6 Click Score: 16    End of Session   Activity Tolerance: Patient limited by pain Patient left: in bed;with call bell/phone within reach;Other (comment)(OT in room) Nurse Communication: Mobility status PT Visit Diagnosis: Other abnormalities of gait and mobility (R26.89);Pain Pain - part of body: (back)     Time: 7741-2878 PT Time Calculation (min) (ACUTE ONLY): 18 min  Charges:  $Therapeutic Activity: 8-22 mins                    G Codes:       [place], [place], [place] 10/02/2017, 5:05 PM

## 2017-10-02 NOTE — Clinical Social Work Placement (Signed)
   CLINICAL SOCIAL WORK PLACEMENT  NOTE  Date:  10/02/2017  Patient Details  Name: Brenda Hall MRN: [id number] Date of Birth: 08-Aug-1930  Clinical Social Work is seeking post-discharge placement for this patient at the Cataract level of care (*CSW will initial, date and re-position this form in  chart as items are completed):  Yes   Patient/family provided with [facility] Work Department's list of facilities offering this level of care within the geographic area requested by the patient (or if unable, by the patient's family).  Yes   Patient/family informed of their freedom to choose among providers that offer the needed level of care, that participate in Medicare, Medicaid or managed care program needed by the patient, have an available bed and are willing to accept the patient.  Yes   Patient/family informed of [place]'s ownership interest in [facility] and [facility], as well as of the fact that they are under no obligation to receive care at these facilities.  PASRR submitted to EDS on       PASRR number received on 10/01/17     Existing PASRR number confirmed on       FL2 transmitted to all facilities in geographic area requested by pt/family on 10/01/17     FL2 transmitted to all facilities within larger geographic area on       Patient informed that his/her managed care company has contracts with or will negotiate with certain facilities, including the following:        Yes   Patient/family informed of bed offers received.  Patient chooses bed at [place] [facility]     Physician recommends and patient chooses bed at      Patient to be transferred to [facility] [place] on 10/02/17.  Patient to be transferred to facility by [facility]     Patient family notified on 10/02/17 of transfer.  Name of family member notified:  pt responsible for self     PHYSICIAN       Additional Comment:     _______________________________________________ Normajean Baxter, LCSW 10/02/2017, 3:32 PM

## 2017-10-02 NOTE — Social Work (Signed)
Clinical Social Worker facilitated patient discharge including contacting patient family and facility to confirm patient discharge plans.  Clinical information faxed to facility and family agreeable with plan.    CSW arranged ambulance transport via [facility] to [facility].    RN to call [phone] to give report prior to discharge.  Clinical Social Worker will sign off for now as social work intervention is no longer needed. Please consult [place] again if new need arises.  Elissa Hefty, LCSW Clinical Social Worker [phone]

## 2017-10-02 NOTE — Social Work (Signed)
CSW f/u on disposition.  CSW contacted admission staff at [facility] and they indicated that they do not have any beds.  CSW f/u with [facility] and they have offered a SNF bed.   CSW will discuss bed offers with patient.  Elissa Hefty, LCSW Clinical Social Worker [phone]

## 2017-10-02 NOTE — Progress Notes (Signed)
At 2125 [facility] arrived to transport patient to SNF.  Patient is alert and oriented x 4, pain medication provided at 2111 and her evening scheduled meds were given for the night.  Patient left on stretcher along with her belongings.

## 2017-10-02 NOTE — Consult Note (Signed)
[facility] CM Primary Care Navigator  10/02/2017  Brenda Hall 1930-12-17 [id number]   Met withpatient at the bedside to identify possible discharge needs. Patient reportshaving "severe muscle spasms/ back pain"; was found to have L1 fracture on MRI which resulted to this admission. (underwent L1 balloon kyphoplasty)   Patient endorses Dr.William Luking with [facility] as theprimary care provider.   [facility]  and [facility] to obtain medications without difficulty.  Patienthas beenmanaging hermedications at homeusing "pill box" system filled weekly.  Her son Shanon Brow), daughter Lattie Haw) or friends Jolayne Haines and Manuela Schwartz) have beenproviding transportation to [employer]' appointments.  Patient's grandson is living with her but he works and is at home in the evenings to assist with patient. Prior to admission, she has been the primary caregiver for herself. Patient has children who can provide assistance when available.  Anticipated discharge plan is skilled nursing facility for rehabilitation per therapy recommendation.  Patient voiced understanding to call primary care provider's office when she returnsbackhome, for a post discharge follow-upvisitwithin 1- 2 weeks or sooner if needed.Patient letter (with PCP's contact number) was provided as a reminder.  Explained to patient about  [facility] CM services available for healthmanagement and resourcesat home and had voiced interest for it. Patient verbalized understandingof needto seekreferral from primary care provider to [facility] care management ifdeemed necessary and appropriatefor anyservicesin the nearfuture- once she returns back home.   [facility] care management information provided for future needs thatshe may have.   Primary care provider's office is listed as providing transition of care (TOC)  follow-up.    For additional questions please contact:  Edwena Felty A. Veeda Virgo, BSN, RN-BC [facility] PRIMARY CARE Navigator Cell: [phone]

## 2017-10-02 NOTE — Progress Notes (Signed)
OT Cancellation Note  Patient Details Name: Brenda Hall MRN: [id number] DOB: [birth year]   Cancelled Treatment:    Reason Eval/Treat Not Completed: Patient declined, no reason specified(reports dcing to SNF and "doesn't want to before the trip" ).   Delight Stare, OTR/L  Pager [place] 10/02/2017, 4:18 PM

## 2017-10-02 NOTE — Social Work (Signed)
CSW met with patient and discussed SNF offers.  Pt accepted SNF bed offer [facility]. CSW confirmed bed offer with SNF.  CSW will f/u with disposition.  Elissa Hefty, LCSW Clinical Social Worker [phone]

## 2017-10-02 NOTE — Discharge Summary (Signed)
Physician Discharge Summary  Brenda Hall ZOX:[id number] DOB: Jul 09, 1930 DOA: 09/26/2017  PCP: [name], MD  Admit date: 09/26/2017 Discharge date: 10/02/2017  Time spent: 55 minutes  Recommendations for Outpatient Follow-up:  1. Patient is to be discharged to a skilled nursing facility.  Follow-up with MD at skilled nursing facility. 2. Follow-up with Dr. Estanislado Pandy in 2 weeks.   Discharge Diagnoses:  Principal Problem:   L1 vertebral fracture (HCC) Active Problems:   Essential hypertension   CAD, NATIVE VESSEL   History of COPD   Basal cell carcinoma   UTI (urinary tract infection)   CKD (chronic kidney disease), stage III (HCC)   History of TIA (transient ischemic attack)   PAF (paroxysmal atrial fibrillation) (HCC)   Community acquired pneumonia   Discharge Condition: Stable and improved  Diet recommendation: Heart healthy  Filed Weights   09/28/17 0500 09/29/17 0613 10/02/17 0500  Weight: 71.9 kg (158 lb 8.2 oz) 72.4 kg (159 lb 9.8 oz) 71.4 kg (157 lb 6.5 oz)    History of present illness:  Per Dr.Xu Brenda Hall is a 84 y.o. female with medical history significant for hypertension, CAD, MI 2007, A. Fib, carotid stenosis, hyperlipidemia, TIA, CVA, severe osteoporosis, osteoarthritis, polymyalgia rheumatica and lumbar 2 compression fracture status post vertebroplasy in march, as well as T12 vertebral plasty last month presents to the emergency department from interventional radiology chief complaint intractable back pain. MRI reveals acute L1 fracture. [facility] are asked to admit  Information was obtained from the patient and the chart. Patient reported a three-day history of intermittent back "spasms". During the spasms she has sharp lumbar spine pain. She reports taking Robaxin at home with some relief. She denies any recent fall or injury. Reports a procedure on her thoracic spine with good relief. Yesterday she was unable to lie down for  the MRI they sent her to the emergency department for pain control. She denies any numbness tingling of her extremities. She denies any weakness in lower extremities. No fever chills nausea vomiting diarrhea constipation melena bright red blood per rectum. No dysuria hematuria frequency or urgency.    ED Course: n the emergency department she's afebrile hemodynamically stable and not hypoxic. workup does reveal urinary tract infection and she is started on Rocephin. She is also provided with Flexeril and morphine for pain management. At the time of admission pain within tolerable limits    Hospital Course:  1 new acute L1 fracture Per MRI.  Patient received IV Decadron in the ED.   Interventional radiology was consulted and patient subsequently underwent L1 balloon kyphoplasty 09/30/2017 per Dr. Marjory Lies .  Patient was placed on Robaxin as needed.  Due to increased pain Lidoderm patch was added for better pain control.  Patient was also started on Ultram as needed.  Patient was seen by physical therapy who recommended skilled nursing facility placement.  Patient be discharged to a skilled nursing facility and will follow up with Dr. Estanislado Pandy, interventional radiology in 2 weeks.  2.  E. coli UTI Patient noted to have a fever of 102.4 on 09/28/2017.  No CVA tenderness.  Chest x-ray obtained was concerning for left retrocardiac opacity.  Urinalysis which was obtained was concerning for urinary tract infection and patient was initially started on IV Rocephin.  Urine cultures came back positive for E. coli which was pansensitive.  Patient will be discharged on Augmentin for 5 more days to complete a course of antibiotic treatment as patient also noted to  have a community-acquired pneumonia.  3.  Community-acquired pneumonia Per chest x-ray.  Patient was afebrile.  Patient was initially placed on IV Rocephin for UTI and azithromycin was added to patient's regimen.  Patient will be discharged on 5  more days of oral Augmentin to complete a one-week course of antibiotic treatment.  Outpatient follow-up.   4.  Hypertension Patient was maintained on home regimen of Norvasc and Toprol-XL.  Blood pressure was controlled throughout the hospitalization.    5.  Chronic kidney disease stage III Stable.  6.  Paroxysmal atrial fibrillation/history of TIA She was maintained on home regimen of Toprol-XL for rate control.  Xarelto was resumed postoperatively.  Patient had no bleeding issues.  Outpatient follow-up.    7.  COPD Remained stable.    8.  Status post recent right lower eyelid surgery resection of basal cell carcinoma Outpatient follow-up with ophthalmology.     Procedures:  Chest x-ray 09/29/2017  MRI L-spine 09/27/2017  Status post L1 balloon kyphoplasty 09/30/2017 per Dr. Estanislado Pandy      Consultations:  Interventional radiology Dr. Earleen Newport 09/29/2017      Discharge Exam: Vitals:   10/01/17 2020 10/02/17 0449  BP: 139/64 (!) 144/62  Pulse: 64 (!) 51  Resp:    Temp: 97.7 F (36.5 C) (!) 97.2 F (36.2 C)  SpO2: 95% 95%    General: NAD Cardiovascular: RRR Respiratory: CTAB  Discharge Instructions   Discharge Instructions    Diet - low sodium heart healthy   Complete by:  As directed    Increase activity slowly   Complete by:  As directed      Allergies as of 10/02/2017      Reactions   Epinephrine Other (See Comments)   During dental procedure-caused facial swelling at injection site. Pt has had epinephrine since then for nose and facial surgery and did fine with it per pt.   Clopidogrel Bisulfate Itching, Rash   Plavix   Oxycodone    hallucinations      Medication List    TAKE these medications   acetaminophen 500 MG tablet Commonly known as:  TYLENOL Take 500 mg by mouth every 6 (six) hours as needed for mild pain.   amLODipine 5 MG tablet Commonly known as:  NORVASC Take 5 mg by mouth daily.   amoxicillin-clavulanate 875-125 MG  tablet Commonly known as:  AUGMENTIN Take 1 tablet by mouth 2 (two) times daily for 5 days.   aspirin 81 MG tablet Take 81 mg by mouth daily.   atorvastatin 80 MG tablet Commonly known as:  LIPITOR Take 1 tablet (80 mg total) by mouth at bedtime.   CALCIUM 1200 PO Take 1 tablet by mouth at bedtime.   estrogens (conjugated) 0.3 MG tablet Commonly known as:  PREMARIN Take 1 tablet (0.3 mg total) by mouth every evening.   fluticasone 50 MCG/ACT nasal spray Commonly known as:  FLONASE USE TWO SPRAYS IN EACH NOSTRIL ONCE DAILY What changed:    how much to take  how to take this  when to take this  reasons to take this  additional instructions   furosemide 20 MG tablet Commonly known as:  LASIX Take 20 mg DAILY AS NEEDED FOR SWELLING. What changed:    how much to take  how to take this  when to take this  reasons to take this  additional instructions   gabapentin 100 MG capsule Commonly known as:  NEURONTIN Take 1 capsule (100 mg total) by mouth at  bedtime.   lidocaine 5 % Commonly known as:  LIDODERM Place 1 patch onto the skin daily. Remove & Discard patch within 12 hours or as directed by MD   loperamide 2 MG capsule Commonly known as:  IMODIUM Take 2 mg by mouth daily as needed for diarrhea or loose stools.   methocarbamol 500 MG tablet Commonly known as:  ROBAXIN Take 2 tablets (1,000 mg total) by mouth every 8 (eight) hours as needed for muscle spasms. What changed:  how much to take   metoprolol succinate 50 MG 24 hr tablet Commonly known as:  TOPROL-XL Take 1 and 1/2 Tablets At Night What changed:    how much to take  how to take this  when to take this  additional instructions   nitroGLYCERIN 0.4 MG SL tablet Commonly known as:  NITROSTAT Place 1 tablet (0.4 mg total) under the tongue every 5 (five) minutes as needed for chest pain.   pantoprazole 40 MG tablet Commonly known as:  PROTONIX TAKE 1 TABLET EVERY MORNING   predniSONE  5 MG tablet Commonly known as:  DELTASONE TAKE 1 TABLET BY MOUTH DAILY WITH BREAKFAST(TAPER BY 1 MG EVERY MONTH) What changed:  See the new instructions.   senna-docusate 8.6-50 MG tablet Commonly known as:  Senokot-S Take 1 tablet by mouth at bedtime as needed for mild constipation.   traMADol 50 MG tablet Commonly known as:  ULTRAM Take 1-2 tablets (50-100 mg total) by mouth every 6 (six) hours as needed for moderate pain.   XARELTO 15 MG Tabs tablet Generic drug:  Rivaroxaban TAKE 1 TABLET DAILY WITH SUPPER What changed:  See the new instructions.   Zinc 50 MG Caps Take 50 mg by mouth daily.      Allergies  Allergen Reactions  . Epinephrine Other (See Comments)    During dental procedure-caused facial swelling at injection site. Pt has had epinephrine since then for nose and facial surgery and did fine with it per pt.  . Clopidogrel Bisulfate Itching and Rash    Plavix  . Oxycodone     hallucinations   Follow-up Information    Luanne Bras, MD Follow up in 2 week(s).   Specialties:  Interventional Radiology, Radiology Why:  follow up 2 weeks after L1 KP-- call [phone] if questions or concerns; pt will hear from scheduler for time and date Contact information: [address] [place] [address] [phone]        MD AT SNF Follow up.            The results of significant diagnostics from this hospitalization (including imaging, microbiology, ancillary and laboratory) are listed below for reference.    Significant Diagnostic Studies: Mr Lumbar Spine Wo Contrast  Result Date: 09/27/2017 CLINICAL DATA:  Initial evaluation for severe low back pain over past 2 days, recent vertebral augmentation at T12. EXAM: MRI LUMBAR SPINE WITHOUT CONTRAST TECHNIQUE: Multiplanar, multisequence MR imaging of the lumbar spine was performed. No intravenous contrast was administered. COMPARISON:  Prior MRI from 08/21/2017. FINDINGS: Segmentation: Normal  segmentation. Lowest well-formed disc labeled the L5-S1 level. Same numbering system is employed as on previous exam. Alignment: Levoscoliosis with reversal of the normal lumbar lordosis. Stable anterolisthesis of L2 on L3 and L3 on L4, with trace retrolisthesis of L5 on S1. Vertebrae: Sequelae of interval vertebral augmentation at T12 for previously identified fracture. Increased height loss now measuring up to 40% centrally. Mild 5 mm retropulsion relatively stable. Persistent marrow edema without  new fracture line. There is a new acute fracture extending through the superior endplate of L1 with abnormal marrow edema throughout the superior and mid aspect of the L1 vertebral body. Minimal central height loss without bony retropulsion. No underlying pathologic lesion. Chronic L2 fracture with sequelae of prior vertebral augmentation is unchanged. Chronic L3 fracture also stable. Bone marrow signal intensity heterogeneous but within normal limits. Prominent reactive endplate changes at the L4-5 and L5-S1 interspaces. No worrisome osseous lesions. Conus medullaris and cauda equina: Conus extends to the L1-2 level. Conus and cauda equina appear normal. Paraspinal and other soft tissues: Mild edema within the paraspinous soft tissues adjacent to the T12 and L1 compression fractures. Chronic fatty atrophy noted within the lower posterior paraspinous musculature. Innumerable bilateral renal cysts noted, a few of which demonstrate T2 hypointensity with intermediate T1 signal, likely proteinaceous and/or hemorrhagic cyst. Disc levels: T12-L1: 5 mm bony retropulsion related to the T12 compression fracture with mild disc bulge. Mild facet hypertrophy. Moderate spinal stenosis without cord impingement. Mild bilateral foraminal narrowing. L1-2: Diffuse disc bulge with intervertebral disc space narrowing. Moderate facet hypertrophy, greater on the right. Resultant moderate spinal stenosis, stable. Neural foramina remain patent.  L2-3: Mild disc bulge with flattening of the ventral thecal sac. Moderate facet hypertrophy. Mild spinal stenosis, stable. Neural foramina remain patent. L3-4: Diffuse degenerative disc bulge with disc desiccation and intervertebral disc space narrowing. Advanced bilateral facet degeneration with ligamentum flavum hypertrophy. Moderate to severe spinal stenosis with left greater than right lateral recess narrowing, similar to previous. Foramina remain patent. L4-5: Remote interbody fusion. Prior right hemi laminectomy. No residual canal or foraminal stenosis. L5-S1: Retrolisthesis. Diffuse disc bulge with chronic reactive endplate changes. Moderate facet and ligament flavum hypertrophy. Remote right laminectomy. Central canal remains patent. Mild left with moderate right L5 foraminal stenosis. IMPRESSION: 1. New/interval L1 vertebral fracture with minimal central height loss without bony retropulsion. 2. Sequelae of interval vertebral augmentation at T12 with mildly progressive height loss and persistent marrow edema. 3. Chronic L2 and L3 compression fractures, stable. 4. Advanced multilevel disc and facet degeneration with kyphoscoliosis. 5. Moderate spinal stenosis at T12-L1 and L1-2, with more moderate to severe narrowing at L3-4. 6. Prior interbody fusion at L4-5. Electronically Signed   By: Jeannine Boga M.D.   On: 09/27/2017 02:55   Dg Chest Port 1 View  Result Date: 09/29/2017 CLINICAL DATA:  84 year old female with a history of fever EXAM: PORTABLE CHEST 1 VIEW COMPARISON:  02/13/2017, 02/12/2017 FINDINGS: Cardiomediastinal silhouette unchanged in size and contour. Patchy opacity in the retrocardiac region with partial obscuration left hemidiaphragm. No large pleural effusion. Coarsened interstitial markings bilaterally. No pneumothorax. No acute displaced fracture IMPRESSION: Retrocardiac patchy opacity, may represent developing infection or chronic changes. Electronically Signed   By: Corrie Mckusick D.O.   On: 09/29/2017 15:51   Ir Vertebroplasty Cerv/thor Bx Inc Uni/bil Inc/inject/imaging  Result Date: 09/08/2017 INDICATION: Severe thoracolumbar pain secondary to compression fracture at T12. EXAM: VERTEBROPLASTY T12 MEDICATIONS: As antibiotic prophylaxis, Ancef 2 g IV was ordered pre-procedure and administered intravenously within 1 hour of incision. ANESTHESIA/SEDATION: Moderate (conscious) sedation was employed during this procedure. A total of Versed 2 mg and Fentanyl 75 mcg was administered intravenously. Moderate Sedation Time: 19 minutes. The patient's level of consciousness and vital signs were monitored continuously by radiology nursing throughout the procedure under my direct supervision. FLUOROSCOPY TIME:  Fluoroscopy Time: 6 minutes 24 seconds (301 mGy) COMPLICATIONS: None immediate. TECHNIQUE: Informed written consent was obtained from the  patient after a thorough discussion of the procedural risks, benefits and alternatives. All questions were addressed. Maximal Sterile Barrier Technique was utilized including caps, mask, sterile gowns, sterile gloves, sterile drape, hand hygiene and skin antiseptic. A timeout was performed prior to the initiation of the procedure. PROCEDURE: The patient was placed prone on the fluoroscopic table. Nasal oxygen was administered. Physiologic monitoring was performed throughout the duration of the procedure. The skin overlying the thoracolumbar region was prepped and draped in the usual sterile fashion. The T12 vertebral body was identified and the right pedicle was infiltrated with 0.25% Bupivacaine. This was then followed by the advancement of a 13-gauge Cook needle through the right pedicle into the anterior one-third at T12. A gentle contrast injection demonstrated a trabecular pattern of contrast. At this time, methylmethacrylate mixture was reconstituted. Under biplane intermittent fluoroscopy, the methylmethacrylate was then injected into the T12  vertebral body with filling of the vertebral body. No extravasation was noted into the disk spaces or posteriorly into the spinal canal. No epidural venous contamination was seen. The needle was then removed. Hemostasis was achieved at the skin entry site. There were no acute complications. Patient tolerated the procedure well. The patient was observed for 3 hours and discharged in good condition. IMPRESSION: 1. Status post vertebral body augmentation for painful compression fracture at T12 using vertebroplasty technique. Electronically Signed   By: Luanne Bras M.D.   On: 09/05/2017 14:41   Ir Kypho Lumbar Inc Fx Reduce Bone Bx Uni/bil Cannulation Inc/imaging  Result Date: 10/02/2017 INDICATION: Severe low back pain secondary to compression fracture at L1. EXAM: BALLOON KYPHOPLASTY L1 COMPARISON:  MRI scan of 09/27/2017. MEDICATIONS: As antibiotic prophylaxis, patient was receiving IV Rocephin prior to the procedure and administered intravenously within 1 hour of incision. ANESTHESIA/SEDATION: Moderate (conscious) sedation was employed during this procedure. A total of Versed 2 mg and Fentanyl 75 mcg was administered intravenously. Moderate Sedation Time: 28 minutes. The patient's level of consciousness and vital signs were monitored continuously by radiology nursing throughout the procedure under my direct supervision. FLUOROSCOPY TIME:  Fluoroscopy Time: 10 minutes 30 seconds (938 mGy) COMPLICATIONS: None immediate. PROCEDURE: Following a full explanation of the procedure along with the potential associated complications, an informed witnessed consent was obtained. The patient was placed prone on the fluoroscopic table. The skin overlying the thoracolumbar region was then prepped and draped in the usual sterile fashion. The right pedicle at L1 was then infiltrated with 0.25% bupivacaine followed by the advancement of an 11-gauge Jamshidi needle through the right pedicle into the posterior one-third at  L1. This was then exchanged for a Kyphon advanced osteo introducer system comprised of a working cannula and a Kyphon osteo drill. This combination was then advanced over a Kyphon osteo bone pin until the tip of the Kyphon osteo drill was in the posterior third at L1. At this time, the bone pin was removed. In a medial trajectory, the combination was advanced until the tip of the working cannula was inside the posterior one-third at L1. Through the working cannula, a Kyphon inflatable bone tamp 20 x 3 was advanced and positioned with the distal marker 5 mm from the anterior aspect of L1. Crossing of the midline was seen on the AP projection. At this time, the balloon was expanded using contrast via a Kyphon inflation syringe device via microtubing. Inflations were continued until there was apposition with the superior endplate. At this time, methylmethacrylate mixture was reconstituted with Tobramycin in the Kyphon bone mixing  device system. This was then loaded onto the Kyphon bone fillers. The balloon was deflated and removed followed by the instillation of 4 bone filler equivalents of methylmethacrylate mixture at L1 with excellent filling in the AP and lateral projections. No extravasation was noted in the disk spaces or posteriorly into the spinal canal. No epidural venous contamination was seen. The working cannula and the bone filler were then retrieved and removed. Hemostasis was achieved at the skin entry site. The patient tolerated the procedure well and there were no complications. The patient was then transported back to her room in stable condition. IMPRESSION: 1. Status post vertebral body augmentation using balloon kyphoplasty at L1 as described without event. Electronically Signed   By: Luanne Bras M.D.   On: 09/30/2017 16:05    Microbiology: Recent Results (from the past 240 hour(s))  Urine culture     Status: Abnormal   Collection Time: 09/27/17  5:32 AM  Result Value Ref Range Status    Specimen Description URINE, CLEAN CATCH  Final   Special Requests   Final    Normal Performed at [facility], [address], [place], [place] [address]    Culture >=100,000 COLONIES/mL ESCHERICHIA COLI (A)  Final   Report Status 09/29/2017 FINAL  Final   Organism ID, Bacteria ESCHERICHIA COLI (A)  Final      Susceptibility   Escherichia coli - MIC*    AMPICILLIN <=2 SENSITIVE Sensitive     CEFAZOLIN <=4 SENSITIVE Sensitive     CEFTRIAXONE <=1 SENSITIVE Sensitive     CIPROFLOXACIN <=0.25 SENSITIVE Sensitive     GENTAMICIN <=1 SENSITIVE Sensitive     IMIPENEM <=0.25 SENSITIVE Sensitive     NITROFURANTOIN <=16 SENSITIVE Sensitive     TRIMETH/SULFA <=20 SENSITIVE Sensitive     AMPICILLIN/SULBACTAM <=2 SENSITIVE Sensitive     PIP/TAZO <=4 SENSITIVE Sensitive     Extended ESBL NEGATIVE Sensitive     * >=100,000 COLONIES/mL ESCHERICHIA COLI  Culture, blood (Routine X 2) w Reflex to ID Panel     Status: None (Preliminary result)   Collection Time: 09/29/17  2:18 PM  Result Value Ref Range Status   Specimen Description BLOOD RIGHT ANTECUBITAL  Final   Special Requests   Final    BOTTLES DRAWN AEROBIC ONLY Blood Culture results may not be optimal due to an inadequate volume of blood received in culture bottles   Culture   Final    NO GROWTH 3 DAYS Performed at [facility] [address] [place], [place] [address]    Report Status PENDING  Incomplete  Culture, blood (Routine X 2) w Reflex to ID Panel     Status: None (Preliminary result)   Collection Time: 09/29/17  2:22 PM  Result Value Ref Range Status   Specimen Description BLOOD RIGHT ANTECUBITAL  Final   Special Requests   Final    BOTTLES DRAWN AEROBIC ONLY Blood Culture results may not be optimal due to an inadequate volume of blood received in culture bottles   Culture   Final    NO GROWTH 3 DAYS Performed at [facility], [place] [address] [place], [place] [address]    Report Status PENDING  Incomplete   Surgical pcr screen     Status: None   Collection Time: 09/30/17  9:07 AM  Result Value Ref Range Status   MRSA, PCR NEGATIVE NEGATIVE Final   Staphylococcus aureus NEGATIVE NEGATIVE Final    Comment: (NOTE) The Xpert  SA Assay (FDA approved for NASAL specimens in patients 87 years of age and older), is one component of a comprehensive surveillance program. It is not intended to diagnose infection nor to guide or monitor treatment. Performed at [facility], [place] [address], [place],  [address]      Labs: Basic Metabolic Panel: Recent Labs  Lab 09/28/17 0359 09/29/17 0523 09/30/17 0440 10/01/17 0708 10/02/17 0529 10/02/17 1013  NA 139 142 141 142 141  --   K 4.3 4.2 4.6 4.2 5.4* 4.3  CL 104 105 108 108 105  --   CO2 26 26 24 26 29   --   GLUCOSE 136* 92 86 86 94  --   BUN 27* 29* 26* 16 17  --   CREATININE 1.54* 1.44* 1.34* 0.98 1.13*  --   CALCIUM 8.5* 8.5* 8.6* 8.5* 8.8*  --    Liver Function Tests: No results for input(s): AST, ALT, ALKPHOS, BILITOT, PROT, ALBUMIN in the last 168 hours. No results for input(s): LIPASE, AMYLASE in the last 168 hours. No results for input(s): AMMONIA in the last 168 hours. CBC: Recent Labs  Lab 09/28/17 0359 09/29/17 0523 09/30/17 0440 10/01/17 0708 10/02/17 0529  WBC 7.4 8.3 9.4 7.7 9.7  HGB 10.6* 11.3* 12.2 10.7* 11.2*  HCT 34.6* 37.3 40.7 35.4* 37.0  MCV 94.0 94.0 94.7 95.2 94.9  PLT 351 414* 366 340 354   Cardiac Enzymes: No results for input(s): CKTOTAL, CKMB, CKMBINDEX, TROPONINI in the last 168 hours. BNP: BNP (last 3 results) Recent Labs    02/12/17 2014  BNP 440.0*    ProBNP (last 3 results) No results for input(s): PROBNP in the last 8760 hours.  CBG: No results for input(s): GLUCAP in the last 168 hours.     Signed:  Irine Seal MD.  [facility] 10/02/2017, 3:55 PM

## 2017-10-02 NOTE — Progress Notes (Addendum)
Pt is going to SNF [facility], report was given to [place]. 1900 Still waiting for PTAR.

## 2017-10-03 ENCOUNTER — Telehealth: Payer: Self-pay | Admitting: Family Medicine

## 2017-10-03 NOTE — Telephone Encounter (Signed)
Pt calling in to let Dr. Richardson Landry know she is at [facility] for PT and pain management to help with her back spasms. She had 2 back surgeries in the past 4 weeks. She had broken vertebrates. She will call and let Dr. Richardson Landry know when she is able to go back home.

## 2017-10-04 LAB — CULTURE, BLOOD (ROUTINE X 2)
Culture: NO GROWTH
Culture: NO GROWTH

## 2017-10-05 NOTE — Telephone Encounter (Signed)
Ok. Let pt know ive been reading the reports fromt he specialists and im aware of her situation and look forsward to her getting home soon

## 2017-10-06 ENCOUNTER — Telehealth (HOSPITAL_COMMUNITY): Payer: Self-pay

## 2017-10-06 DIAGNOSIS — J449 Chronic obstructive pulmonary disease, unspecified: Secondary | ICD-10-CM | POA: Diagnosis not present

## 2017-10-06 DIAGNOSIS — Z4789 Encounter for other orthopedic aftercare: Secondary | ICD-10-CM | POA: Diagnosis not present

## 2017-10-06 DIAGNOSIS — I480 Paroxysmal atrial fibrillation: Secondary | ICD-10-CM | POA: Diagnosis not present

## 2017-10-06 DIAGNOSIS — I10 Essential (primary) hypertension: Secondary | ICD-10-CM | POA: Diagnosis not present

## 2017-10-06 NOTE — Telephone Encounter (Signed)
Called [facility] to schedule pt's 2 wk f/u, no answer, no vm. AW

## 2017-10-06 NOTE — Telephone Encounter (Signed)
Patient is aware we have no influence.I asked the pt to have her family call and inquire of what is going on. I called the admissions administrator Irven Shelling and asked if she could please find out what was going on .She states the pt was being seen by Dr.Blass's NP at this very moment. I advised that the pt seems to be having some troubles and asked that she please talk with the pt and see if she could help.

## 2017-10-06 NOTE — Telephone Encounter (Signed)
She wanted me to let you know she is at what used to be [facility] and they are horrible there.She was put there Thursday and says she is yet to see a Dr. She would like to know if you have any influence to get things done around there.She state she is so ready to get better to get out of there.

## 2017-10-06 NOTE — Telephone Encounter (Signed)
I am sorry I have absolutely zero influence in placement issues with rehab placement

## 2017-10-07 DIAGNOSIS — N390 Urinary tract infection, site not specified: Secondary | ICD-10-CM | POA: Diagnosis not present

## 2017-10-07 DIAGNOSIS — I480 Paroxysmal atrial fibrillation: Secondary | ICD-10-CM | POA: Diagnosis not present

## 2017-10-07 DIAGNOSIS — I10 Essential (primary) hypertension: Secondary | ICD-10-CM | POA: Diagnosis not present

## 2017-10-07 DIAGNOSIS — J449 Chronic obstructive pulmonary disease, unspecified: Secondary | ICD-10-CM | POA: Diagnosis not present

## 2017-10-13 DIAGNOSIS — M353 Polymyalgia rheumatica: Secondary | ICD-10-CM | POA: Diagnosis not present

## 2017-10-13 DIAGNOSIS — M549 Dorsalgia, unspecified: Secondary | ICD-10-CM | POA: Diagnosis not present

## 2017-10-13 DIAGNOSIS — I480 Paroxysmal atrial fibrillation: Secondary | ICD-10-CM | POA: Diagnosis not present

## 2017-10-18 ENCOUNTER — Other Ambulatory Visit: Payer: Self-pay | Admitting: Rheumatology

## 2017-10-20 NOTE — Telephone Encounter (Signed)
Last visit: 06/24/17 Next Visit due Anahlia 2019, Message sent to front to schedule patient.   Okay to refill per Dr. Estanislado Pandy

## 2017-10-21 ENCOUNTER — Telehealth: Payer: Self-pay | Admitting: Rheumatology

## 2017-10-21 NOTE — Telephone Encounter (Signed)
-----   Message from Carole Binning, LPN sent at 0/10/3541  8:20 AM EDT ----- Regarding: Please schedule patient for a follow up visit.  Please schedule patient for a follow up visit. Patient due Zarriah 2019. Thanks!

## 2017-10-21 NOTE — Telephone Encounter (Signed)
Patient had back surgery on 09/26/17 and will schedule follow-up with Dr. Estanislado Pandy when she is discharged from the rehab facility.

## 2017-10-22 DIAGNOSIS — K589 Irritable bowel syndrome without diarrhea: Secondary | ICD-10-CM | POA: Diagnosis not present

## 2017-10-22 DIAGNOSIS — I129 Hypertensive chronic kidney disease with stage 1 through stage 4 chronic kidney disease, or unspecified chronic kidney disease: Secondary | ICD-10-CM | POA: Diagnosis not present

## 2017-10-22 DIAGNOSIS — M170 Bilateral primary osteoarthritis of knee: Secondary | ICD-10-CM | POA: Diagnosis not present

## 2017-10-22 DIAGNOSIS — Z87440 Personal history of urinary (tract) infections: Secondary | ICD-10-CM | POA: Diagnosis not present

## 2017-10-22 DIAGNOSIS — J449 Chronic obstructive pulmonary disease, unspecified: Secondary | ICD-10-CM | POA: Diagnosis not present

## 2017-10-22 DIAGNOSIS — Z8673 Personal history of transient ischemic attack (TIA), and cerebral infarction without residual deficits: Secondary | ICD-10-CM | POA: Diagnosis not present

## 2017-10-22 DIAGNOSIS — N183 Chronic kidney disease, stage 3 (moderate): Secondary | ICD-10-CM | POA: Diagnosis not present

## 2017-10-22 DIAGNOSIS — Z8701 Personal history of pneumonia (recurrent): Secondary | ICD-10-CM | POA: Diagnosis not present

## 2017-10-22 DIAGNOSIS — Z85820 Personal history of malignant melanoma of skin: Secondary | ICD-10-CM | POA: Diagnosis not present

## 2017-10-22 DIAGNOSIS — M545 Low back pain: Secondary | ICD-10-CM | POA: Diagnosis not present

## 2017-10-22 DIAGNOSIS — M810 Age-related osteoporosis without current pathological fracture: Secondary | ICD-10-CM | POA: Diagnosis not present

## 2017-10-22 DIAGNOSIS — M353 Polymyalgia rheumatica: Secondary | ICD-10-CM | POA: Diagnosis not present

## 2017-10-22 DIAGNOSIS — I2510 Atherosclerotic heart disease of native coronary artery without angina pectoris: Secondary | ICD-10-CM | POA: Diagnosis not present

## 2017-10-22 DIAGNOSIS — S32019D Unspecified fracture of first lumbar vertebra, subsequent encounter for fracture with routine healing: Secondary | ICD-10-CM | POA: Diagnosis not present

## 2017-10-24 ENCOUNTER — Telehealth: Payer: Self-pay | Admitting: *Deleted

## 2017-10-24 ENCOUNTER — Emergency Department (HOSPITAL_COMMUNITY): Payer: Medicare Other

## 2017-10-24 ENCOUNTER — Inpatient Hospital Stay (HOSPITAL_COMMUNITY)
Admission: EM | Admit: 2017-10-24 | Discharge: 2017-10-28 | DRG: 310 | Disposition: A | Discharge status: Home Health | Payer: Medicare Other | Attending: Internal Medicine | Admitting: Internal Medicine

## 2017-10-24 ENCOUNTER — Other Ambulatory Visit: Payer: Self-pay

## 2017-10-24 ENCOUNTER — Encounter (HOSPITAL_COMMUNITY): Payer: Self-pay | Admitting: *Deleted

## 2017-10-24 DIAGNOSIS — I25118 Atherosclerotic heart disease of native coronary artery with other forms of angina pectoris: Secondary | ICD-10-CM | POA: Diagnosis not present

## 2017-10-24 DIAGNOSIS — Z79899 Other long term (current) drug therapy: Secondary | ICD-10-CM

## 2017-10-24 DIAGNOSIS — Z79890 Hormone replacement therapy (postmenopausal): Secondary | ICD-10-CM

## 2017-10-24 DIAGNOSIS — I10 Essential (primary) hypertension: Secondary | ICD-10-CM | POA: Diagnosis not present

## 2017-10-24 DIAGNOSIS — Z7901 Long term (current) use of anticoagulants: Secondary | ICD-10-CM | POA: Diagnosis not present

## 2017-10-24 DIAGNOSIS — R197 Diarrhea, unspecified: Secondary | ICD-10-CM | POA: Diagnosis not present

## 2017-10-24 DIAGNOSIS — G8929 Other chronic pain: Secondary | ICD-10-CM | POA: Diagnosis present

## 2017-10-24 DIAGNOSIS — I4891 Unspecified atrial fibrillation: Secondary | ICD-10-CM | POA: Diagnosis present

## 2017-10-24 DIAGNOSIS — D631 Anemia in chronic kidney disease: Secondary | ICD-10-CM | POA: Diagnosis present

## 2017-10-24 DIAGNOSIS — I2510 Atherosclerotic heart disease of native coronary artery without angina pectoris: Secondary | ICD-10-CM | POA: Diagnosis not present

## 2017-10-24 DIAGNOSIS — Z885 Allergy status to narcotic agent status: Secondary | ICD-10-CM

## 2017-10-24 DIAGNOSIS — R748 Abnormal levels of other serum enzymes: Secondary | ICD-10-CM | POA: Diagnosis not present

## 2017-10-24 DIAGNOSIS — Z888 Allergy status to other drugs, medicaments and biological substances status: Secondary | ICD-10-CM

## 2017-10-24 DIAGNOSIS — Z809 Family history of malignant neoplasm, unspecified: Secondary | ICD-10-CM

## 2017-10-24 DIAGNOSIS — I480 Paroxysmal atrial fibrillation: Secondary | ICD-10-CM | POA: Diagnosis not present

## 2017-10-24 DIAGNOSIS — E785 Hyperlipidemia, unspecified: Secondary | ICD-10-CM | POA: Diagnosis present

## 2017-10-24 DIAGNOSIS — Z8249 Family history of ischemic heart disease and other diseases of the circulatory system: Secondary | ICD-10-CM

## 2017-10-24 DIAGNOSIS — I739 Peripheral vascular disease, unspecified: Secondary | ICD-10-CM

## 2017-10-24 DIAGNOSIS — I252 Old myocardial infarction: Secondary | ICD-10-CM | POA: Diagnosis not present

## 2017-10-24 DIAGNOSIS — R000 Tachycardia, unspecified: Secondary | ICD-10-CM | POA: Diagnosis not present

## 2017-10-24 DIAGNOSIS — M62838 Other muscle spasm: Secondary | ICD-10-CM | POA: Diagnosis present

## 2017-10-24 DIAGNOSIS — Z981 Arthrodesis status: Secondary | ICD-10-CM

## 2017-10-24 DIAGNOSIS — Z7982 Long term (current) use of aspirin: Secondary | ICD-10-CM

## 2017-10-24 DIAGNOSIS — M542 Cervicalgia: Secondary | ICD-10-CM

## 2017-10-24 DIAGNOSIS — R112 Nausea with vomiting, unspecified: Secondary | ICD-10-CM | POA: Diagnosis present

## 2017-10-24 DIAGNOSIS — Z7951 Long term (current) use of inhaled steroids: Secondary | ICD-10-CM

## 2017-10-24 DIAGNOSIS — Z8719 Personal history of other diseases of the digestive system: Secondary | ICD-10-CM

## 2017-10-24 DIAGNOSIS — I129 Hypertensive chronic kidney disease with stage 1 through stage 4 chronic kidney disease, or unspecified chronic kidney disease: Secondary | ICD-10-CM | POA: Diagnosis present

## 2017-10-24 DIAGNOSIS — M810 Age-related osteoporosis without current pathological fracture: Secondary | ICD-10-CM | POA: Diagnosis present

## 2017-10-24 DIAGNOSIS — N183 Chronic kidney disease, stage 3 unspecified: Secondary | ICD-10-CM | POA: Diagnosis present

## 2017-10-24 DIAGNOSIS — Z96653 Presence of artificial knee joint, bilateral: Secondary | ICD-10-CM | POA: Diagnosis present

## 2017-10-24 DIAGNOSIS — M353 Polymyalgia rheumatica: Secondary | ICD-10-CM | POA: Diagnosis present

## 2017-10-24 DIAGNOSIS — Z8673 Personal history of transient ischemic attack (TIA), and cerebral infarction without residual deficits: Secondary | ICD-10-CM

## 2017-10-24 DIAGNOSIS — Z90710 Acquired absence of both cervix and uterus: Secondary | ICD-10-CM

## 2017-10-24 DIAGNOSIS — R110 Nausea: Secondary | ICD-10-CM | POA: Diagnosis present

## 2017-10-24 DIAGNOSIS — K589 Irritable bowel syndrome without diarrhea: Secondary | ICD-10-CM | POA: Diagnosis present

## 2017-10-24 DIAGNOSIS — Z85820 Personal history of malignant melanoma of skin: Secondary | ICD-10-CM

## 2017-10-24 LAB — URINALYSIS, ROUTINE W REFLEX MICROSCOPIC
BILIRUBIN URINE: NEGATIVE
GLUCOSE, UA: NEGATIVE mg/dL
Hgb urine dipstick: NEGATIVE
KETONES UR: NEGATIVE mg/dL
LEUKOCYTES UA: NEGATIVE
Nitrite: NEGATIVE
PROTEIN: NEGATIVE mg/dL
Specific Gravity, Urine: 1.006 (ref 1.005–1.030)
pH: 7 (ref 5.0–8.0)

## 2017-10-24 LAB — CBC
HCT: 42 % (ref 36.0–46.0)
Hemoglobin: 13.2 g/dL (ref 12.0–15.0)
MCH: 29.1 pg (ref 26.0–34.0)
MCHC: 31.4 g/dL (ref 30.0–36.0)
MCV: 92.7 fL (ref 78.0–100.0)
PLATELETS: 576 10*3/uL — AB (ref 150–400)
RBC: 4.53 MIL/uL (ref 3.87–5.11)
RDW: 14.2 % (ref 11.5–15.5)
WBC: 11.2 10*3/uL — AB (ref 4.0–10.5)

## 2017-10-24 LAB — LIPASE, BLOOD: Lipase: 34 U/L (ref 11–51)

## 2017-10-24 LAB — I-STAT CHEM 8, ED
BUN: 17 mg/dL (ref 8–23)
CALCIUM ION: 1.12 mmol/L — AB (ref 1.15–1.40)
Chloride: 102 mmol/L (ref 98–111)
Creatinine, Ser: 1.2 mg/dL — ABNORMAL HIGH (ref 0.44–1.00)
GLUCOSE: 98 mg/dL (ref 70–99)
HCT: 43 % (ref 36.0–46.0)
HEMOGLOBIN: 14.6 g/dL (ref 12.0–15.0)
POTASSIUM: 4.1 mmol/L (ref 3.5–5.1)
Sodium: 140 mmol/L (ref 135–145)
TCO2: 22 mmol/L (ref 22–32)

## 2017-10-24 LAB — COMPREHENSIVE METABOLIC PANEL
ALT: 16 U/L (ref 0–44)
AST: 26 U/L (ref 15–41)
Albumin: 3.2 g/dL — ABNORMAL LOW (ref 3.5–5.0)
Alkaline Phosphatase: 77 U/L (ref 38–126)
Anion gap: 13 (ref 5–15)
BILIRUBIN TOTAL: 0.9 mg/dL (ref 0.3–1.2)
BUN: 18 mg/dL (ref 8–23)
CO2: 24 mmol/L (ref 22–32)
CREATININE: 1.37 mg/dL — AB (ref 0.44–1.00)
Calcium: 9.2 mg/dL (ref 8.9–10.3)
Chloride: 103 mmol/L (ref 98–111)
GFR calc Af Amer: 40 mL/min — ABNORMAL LOW (ref >60)
GFR, EST NON AFRICAN AMERICAN: 34 mL/min — AB (ref >60)
Glucose, Bld: 106 mg/dL — ABNORMAL HIGH (ref 70–99)
Potassium: 4.2 mmol/L (ref 3.5–5.1)
Sodium: 140 mmol/L (ref 135–145)
TOTAL PROTEIN: 6.9 g/dL (ref 6.5–8.1)

## 2017-10-24 LAB — I-STAT TROPONIN, ED: TROPONIN I, POC: 0.1 ng/mL — AB (ref 0.00–0.08)

## 2017-10-24 LAB — TROPONIN I: TROPONIN I: 0.07 ng/mL — AB (ref <0.03)

## 2017-10-24 MED ORDER — AMLODIPINE BESYLATE 5 MG PO TABS: 5.0000 mg | ORAL_TABLET | Freq: Every day | ORAL | Status: DC

## 2017-10-24 MED ORDER — METOPROLOL SUCCINATE ER 50 MG PO TB24
50.0000 mg | ORAL_TABLET | Freq: Every evening | ORAL | Status: DC
  Administered 2017-10-24: 50 mg via ORAL
  Filled 2017-10-24: qty 1

## 2017-10-24 MED ORDER — SODIUM CHLORIDE 0.9% FLUSH
3.0000 mL | Freq: Two times a day (BID) | INTRAVENOUS | Status: DC
  Administered 2017-10-24 – 2017-10-28 (×8): 3 mL via INTRAVENOUS

## 2017-10-24 MED ORDER — METOPROLOL TARTRATE 5 MG/5ML IV SOLN
5.0000 mg | Freq: Once | INTRAVENOUS | Status: AC
  Administered 2017-10-24: 5 mg via INTRAVENOUS
  Filled 2017-10-24: qty 5

## 2017-10-24 MED ORDER — ONDANSETRON HCL 4 MG/2ML IJ SOLN
4.0000 mg | Freq: Four times a day (QID) | INTRAMUSCULAR | Status: DC | PRN
  Administered 2017-10-24: 4 mg via INTRAVENOUS
  Filled 2017-10-24: qty 2

## 2017-10-24 MED ORDER — PANTOPRAZOLE SODIUM 40 MG PO TBEC
40.0000 mg | DELAYED_RELEASE_TABLET | Freq: Every morning | ORAL | Status: DC
  Administered 2017-10-25 – 2017-10-28 (×4): 40 mg via ORAL
  Filled 2017-10-24 (×4): qty 1

## 2017-10-24 MED ORDER — ONDANSETRON HCL 4 MG PO TABS: 4.0000 mg | ORAL_TABLET | Freq: Four times a day (QID) | ORAL | Status: DC | PRN

## 2017-10-24 MED ORDER — DILTIAZEM HCL-DEXTROSE 100-5 MG/100ML-% IV SOLN (PREMIX)
5.0000 mg/h | INTRAVENOUS | Status: DC
  Filled 2017-10-24: qty 100

## 2017-10-24 MED ORDER — SENNOSIDES-DOCUSATE SODIUM 8.6-50 MG PO TABS
1.0000 | ORAL_TABLET | Freq: Every evening | ORAL | Status: DC | PRN
Start: 2017-10-24 — End: 2017-10-28

## 2017-10-24 MED ORDER — ASPIRIN 81 MG PO CHEW
81.0000 mg | CHEWABLE_TABLET | Freq: Every day | ORAL | Status: DC
Start: 2017-10-24 — End: 2017-10-28
  Administered 2017-10-25 – 2017-10-28 (×4): 81 mg via ORAL
  Filled 2017-10-24 (×4): qty 1

## 2017-10-24 MED ORDER — ATORVASTATIN CALCIUM 40 MG PO TABS
80.0000 mg | ORAL_TABLET | Freq: Every day | ORAL | Status: DC
  Administered 2017-10-25 – 2017-10-27 (×3): 80 mg via ORAL
  Filled 2017-10-24 (×3): qty 2

## 2017-10-24 MED ORDER — ESTROGENS CONJUGATED 0.3 MG PO TABS
0.3000 mg | ORAL_TABLET | Freq: Every evening | ORAL | Status: DC
  Administered 2017-10-25 – 2017-10-28 (×4): 0.3 mg via ORAL
  Filled 2017-10-24 (×5): qty 1

## 2017-10-24 MED ORDER — RIVAROXABAN 15 MG PO TABS
15.0000 mg | ORAL_TABLET | Freq: Every day | ORAL | Status: DC
  Administered 2017-10-24 – 2017-10-28 (×5): 15 mg via ORAL
  Filled 2017-10-24 (×5): qty 1

## 2017-10-24 MED ORDER — TRAMADOL HCL 50 MG PO TABS: 50.0000 mg | ORAL_TABLET | Freq: Four times a day (QID) | ORAL | Status: DC | PRN

## 2017-10-24 MED ORDER — METHOCARBAMOL 500 MG PO TABS
1000.0000 mg | ORAL_TABLET | Freq: Three times a day (TID) | ORAL | Status: DC | PRN
  Administered 2017-10-26: 1000 mg via ORAL
  Filled 2017-10-24: qty 2

## 2017-10-24 MED ORDER — ACETAMINOPHEN 500 MG PO TABS
500.0000 mg | ORAL_TABLET | Freq: Four times a day (QID) | ORAL | Status: DC | PRN
Start: 2017-10-24 — End: 2017-10-28
  Administered 2017-10-26: 500 mg via ORAL
  Filled 2017-10-24: qty 1

## 2017-10-24 MED ORDER — FLUTICASONE PROPIONATE 50 MCG/ACT NA SUSP: 1.0000 | Freq: Every day | NASAL | Status: DC | PRN

## 2017-10-24 MED ORDER — LIDOCAINE 5 % EX PTCH
1.0000 | MEDICATED_PATCH | CUTANEOUS | Status: DC
  Administered 2017-10-24 – 2017-10-27 (×4): 1 via TRANSDERMAL
  Filled 2017-10-24 (×4): qty 1

## 2017-10-24 MED ORDER — LOPERAMIDE HCL 2 MG PO CAPS: 2.0000 mg | ORAL_CAPSULE | Freq: Every day | ORAL | Status: DC | PRN

## 2017-10-24 NOTE — ED Notes (Signed)
Went in to start patient on cardizem drip. Noticed monitor showing sinus rhythm with a rate of 71. Advised Dr Dayna Barker and Dr Nehemiah Settle. Cardizem medication not to be given at this time.

## 2017-10-24 NOTE — H&P (Addendum)
History and Physical  [name] WEX:[id number] DOB: March 06, 1931 DOA: 10/24/2017  Referring physician: Dr Eulis Foster home, ED physician PCP: [name], MD  Outpatient Specialists:   Patient Coming From: Home  Chief Complaint: Vomiting  HPI: Brenda Hall is a 84 y.o. female with a history of CAD with NSTEMI, basal cell carcinoma status post excision, chronic kidney disease stage III, history of TIA, hypertension, IBS, osteoporosis, polymyalgia rheumatica.  Patient has had several surgeries over the past few weeks and presents with nausea and vomiting since Tuesday.  Patient has found it difficult to keep food down and has been having difficulties taking her pills over the past several days.  Her symptoms are worsening.  No palliating or provoking factors.  Denies shortness of breath, palpitations.  Emergency Department Course:  On arrival, patient was noted to be In A. fib with RVR.  After 2 doses of metoprolol, the patient spontaneously converted to sinus rhythm.  Review of Systems:   Pt denies any fevers, chills, nausea, vomiting, diarrhea, constipation, abdominal pain, shortness of breath, dyspnea on exertion, orthopnea, cough, wheezing, palpitations, headache, vision changes, lightheadedness, dizziness, melena, rectal bleeding.  Review of systems are otherwise negative  Past Medical History:  Diagnosis Date  . Anemia   . CAD in native artery    a. NSTEMI 12/2005 s/p BMS to Cx, prior normal LVEF.  Marland Kitchen Cancer ([place]) 1988 and  2009   BCC nose and  Melanoma back  . Cancer of skin Jan. 2014   [facility] Forehead and Nose  . Carotid stenosis    mild  . Chronic back pain   . CKD (chronic kidney disease), stage III ([place])   . Coronary atherosclerosis of unspecified type of vessel, native or graft   . DJD (degenerative joint disease) of knee   . History of TIA (transient ischemic attack)   . HTN (hypertension)   . IBS (irritable bowel syndrome)   . Myocardial infarction [facility])      5 yrs ago     dr wall  cardiac  . Osteoporosis   . Other diseases of lung, not elsewhere classified    lung nodule  . Polymyalgia rheumatica ([place])   . Shingles 01/2016-02/2016  . Stroke [facility])    Past Surgical History:  Procedure Laterality Date  . ABDOMINAL HYSTERECTOMY  1977   partial  . BACK SURGERY  1982  . CARDIAC CATHETERIZATION    . COLONOSCOPY N/A 01/27/2013   Procedure: COLONOSCOPY;  Surgeon: Rogene Houston, MD;  Location: [facility];  Service: Endoscopy;  Laterality: N/A;  1030  . EYE SURGERY Bilateral 04/2017  . IR KYPHO LUMBAR INC FX REDUCE BONE BX UNI/BIL CANNULATION INC/IMAGING  09/30/2017  . IR VERTEBROPLASTY CERV/THOR BX INC UNI/BIL INC/INJECT/IMAGING  09/05/2017  . JOINT REPLACEMENT  2001 and  2011   6 knee surgeries   bil replacement  . KNEE ARTHROSCOPY    . LUMBAR LAMINECTOMY    . PR VEIN BYPASS GRAFT,AORTO-FEM-POP  2010  . removal of melanoma from back  2007  . SPINE SURGERY  2013   Broken back: fusion surgery  . tonisllectomy  1944  . total knee anthroplasty    . VERTEBROPLASTY  07/11/2011   Procedure: VERTEBROPLASTY;  Surgeon: Faythe Ghee, MD;  Location: [facility]URO ORS;  Service: Neurosurgery;  Laterality: N/A;  Vertebroplasty of Lumbar Two   Social History:  reports that she has never smoked. She has never used smokeless tobacco. She reports that she drinks alcohol.  She reports that she does not use drugs. Patient lives at home  Allergies  Allergen Reactions  . Epinephrine Other (See Comments)    During dental procedure-caused facial swelling at injection site. Pt has had epinephrine since then for nose and facial surgery and did fine with it per pt.  . Clopidogrel Bisulfate Itching and Rash    Plavix  . Oxycodone     hallucinations    Family History  Problem Relation Age of Onset  . Heart disease Father   . Hypertension Father   . Heart attack Father   . Cancer Mother        bladder   . Hypertension Daughter   . Atrial fibrillation  Daughter   . Hypertension Son   . Colon cancer Neg Hx       Prior to Admission medications   Medication Sig Start Date End Date Taking? Authorizing Provider  acetaminophen (TYLENOL) 500 MG tablet Take 500 mg by mouth every 6 (six) hours as needed for mild pain.   Yes [provider]  amLODipine (NORVASC) 5 MG tablet Take 5 mg by mouth daily.   Yes [provider]  aspirin 81 MG tablet Take 81 mg by mouth daily.   Yes [provider]  atorvastatin (LIPITOR) 80 MG tablet Take 1 tablet (80 mg total) by mouth at bedtime. 05/29/17  Yes [name], MD  Calcium Carbonate-Vit D-Min (CALCIUM 1200 PO) Take 1 tablet by mouth at bedtime.    Yes [provider]  estrogens, conjugated, (PREMARIN) 0.3 MG tablet Take 1 tablet (0.3 mg total) by mouth every evening. 02/14/17  Yes Johnson, Clanford L, MD  fluticasone (FLONASE) 50 MCG/ACT nasal spray USE TWO SPRAYS IN EACH NOSTRIL ONCE DAILY Patient taking differently: Place 1 spray into both nostrils daily as needed for allergies. USE TWO SPRAYS IN EACH NOSTRIL ONCE DAILY 12/03/16  Yes [name], MD  furosemide (LASIX) 20 MG tablet Take 20 mg DAILY AS NEEDED FOR SWELLING. Patient taking differently: Take 20 mg by mouth daily as needed for fluid. Take 20 mg DAILY AS NEEDED FOR SWELLING. 09/02/17  Yes Branch, Alphonse Guild, MD  lidocaine (LIDODERM) 5 % Place 1 patch onto the skin daily. Remove & Discard patch within 12 hours or as directed by MD 10/02/17  Yes Eugenie Filler, MD  loperamide (IMODIUM) 2 MG capsule Take 2 mg by mouth daily as needed for diarrhea or loose stools.    Yes [provider]  metoprolol succinate (TOPROL-XL) 50 MG 24 hr tablet Take 1 and 1/2 Tablets At Night Patient taking differently: Take 75 mg by mouth every evening. Take 1 and 1/2 Tablets At Night 04/17/17  Yes Lendon Colonel, NP  pantoprazole (PROTONIX) 40 MG tablet TAKE 1 TABLET EVERY MORNING 10/20/17  Yes Deveshwar, Abel Presto, MD    senna-docusate (SENOKOT-S) 8.6-50 MG tablet Take 1 tablet by mouth at bedtime as needed for mild constipation. 10/02/17  Yes Eugenie Filler, MD  traMADol (ULTRAM) 50 MG tablet Take 1-2 tablets (50-100 mg total) by mouth every 6 (six) hours as needed for moderate pain. 10/02/17  Yes Eugenie Filler, MD  XARELTO 15 MG TABS tablet TAKE 1 TABLET DAILY WITH SUPPER 09/30/17  Yes Arnoldo Lenis, MD  Zinc 50 MG CAPS Take 50 mg by mouth daily.   Yes [provider]  gabapentin (NEURONTIN) 100 MG capsule Take 1 capsule (100 mg total) by mouth at bedtime. Patient not taking: Reported on 09/27/2017  08/27/17   Jessy Oto, MD  methocarbamol (ROBAXIN) 500 MG tablet Take 2 tablets (1,000 mg total) by mouth every 8 (eight) hours as needed for muscle spasms. 10/02/17   Eugenie Filler, MD  nitroGLYCERIN (NITROSTAT) 0.4 MG SL tablet Place 1 tablet (0.4 mg total) under the tongue every 5 (five) minutes as needed for chest pain. 12/03/16   [name], MD  predniSONE (DELTASONE) 5 MG tablet TAKE 1 TABLET BY MOUTH DAILY WITH BREAKFAST(TAPER BY 1 MG EVERY MONTH) Patient not taking: Reported on 10/24/2017 09/18/17   Bo Merino, MD    Physical Exam: BP 131/66   Pulse 72   Temp 98.3 F (36.8 C) (Oral)   Resp (!) 24   Ht 5\' 5"  (1.651 m)   Wt 68 kg (150 lb)   SpO2 94%   BMI 24.96 kg/m   . General: Elderly Caucasian female. Awake and alert and oriented x3. No acute cardiopulmonary distress.  Marland Kitchen HEENT: Normocephalic atraumatic.  Right and left ears normal in appearance.  Pupils equal, round, reactive to light. Extraocular muscles are intact. Sclerae anicteric and noninjected.  Moist mucosal membranes. No mucosal lesions.  . Neck: Neck supple without lymphadenopathy. No carotid bruits. No masses palpated.  . Cardiovascular: Regular rate with normal S1-S2 sounds. No murmurs, rubs, gallops auscultated. No JVD.  Marland Kitchen Respiratory: Good respiratory effort with no wheezes, rales, rhonchi. Lungs  clear to auscultation bilaterally.  No accessory muscle use. . Abdomen: Soft, nontender, nondistended. Active bowel sounds. No masses or hepatosplenomegaly  . Skin: No rashes, lesions, or ulcerations.  Dry, warm to touch. 2+ dorsalis pedis and radial pulses. . Musculoskeletal: No calf or leg pain. All major joints not erythematous nontender.  No upper or lower joint deformation.  Good ROM.  No contractures  . Psychiatric: Intact judgment and insight. Pleasant and cooperative. . Neurologic: No focal neurological deficits. Strength is 5/5 and symmetric in upper and lower extremities.  Cranial nerves II through XII are grossly intact.           Labs on Admission: I have personally reviewed following labs and imaging studies  CBC: Recent Labs  Lab 10/24/17 1359 10/24/17 1404  WBC 11.2*  --   HGB 13.2 14.6  HCT 42.0 43.0  MCV 92.7  --   PLT 576*  --    Basic Metabolic Panel: Recent Labs  Lab 10/24/17 1359 10/24/17 1404  NA 140 140  K 4.2 4.1  CL 103 102  CO2 24  --   GLUCOSE 106* 98  BUN 18 17  CREATININE 1.37* 1.20*  CALCIUM 9.2  --    GFR: Estimated Creatinine Clearance: 31.4 mL/min (A) (by C-G formula based on SCr of 1.2 mg/dL (H)). Liver Function Tests: Recent Labs  Lab 10/24/17 1359  AST 26  ALT 16  ALKPHOS 77  BILITOT 0.9  PROT 6.9  ALBUMIN 3.2*   Recent Labs  Lab 10/24/17 1359  LIPASE 34   No results for input(s): AMMONIA in the last 168 hours. Coagulation Profile: No results for input(s): INR, PROTIME in the last 168 hours. Cardiac Enzymes: No results for input(s): CKTOTAL, CKMB, CKMBINDEX, TROPONINI in the last 168 hours. BNP (last 3 results) No results for input(s): PROBNP in the last 8760 hours. HbA1C: No results for input(s): HGBA1C in the last 72 hours. CBG: No results for input(s): GLUCAP in the last 168 hours. Lipid Profile: No results for input(s): CHOL, HDL, LDLCALC, TRIG, CHOLHDL, LDLDIRECT in the last 72 hours. Thyroid  Function  Tests: No results for input(s): TSH, T4TOTAL, FREET4, T3FREE, THYROIDAB in the last 72 hours. Anemia Panel: No results for input(s): VITAMINB12, FOLATE, FERRITIN, TIBC, IRON, RETICCTPCT in the last 72 hours. Urine analysis:    Component Value Date/Time   COLORURINE YELLOW 10/24/2017 1610   APPEARANCEUR CLEAR 10/24/2017 1610   LABSPEC 1.006 10/24/2017 1610   PHURINE 7.0 10/24/2017 1610   GLUCOSEU NEGATIVE 10/24/2017 1610   HGBUR NEGATIVE 10/24/2017 1610   BILIRUBINUR NEGATIVE 10/24/2017 1610   KETONESUR NEGATIVE 10/24/2017 1610   PROTEINUR NEGATIVE 10/24/2017 1610   UROBILINOGEN 0.2 07/28/2008 1156   NITRITE NEGATIVE 10/24/2017 1610   LEUKOCYTESUR NEGATIVE 10/24/2017 1610   Sepsis Labs: @LABRCNTIP (procalcitonin:4,lacticidven:4) )No results found for this or any previous visit (from the past 240 hour(s)).   Radiological Exams on Admission: Dg Chest Portable 1 View  Result Date: 10/24/2017 CLINICAL DATA:  Tachycardia. EXAM: PORTABLE CHEST 1 VIEW COMPARISON:  Radiograph of Juanetta 10, 2019. FINDINGS: Stable cardiomegaly with central pulmonary vascular congestion is noted. No pneumothorax or significant pleural effusion is noted. No consolidative process is noted. Bony thorax is unremarkable. IMPRESSION: Stable cardiomegaly with central pulmonary vascular congestion. Electronically Signed   By: Marijo Conception, M.D.   On: 10/24/2017 14:57    EKG: Independently reviewed.  First strip shows A. fib with RVR.  Second EKG shows sinus rhythm with borderline ST depression.  Assessment/Plan: Principal Problem:   Atrial fibrillation with RVR (HCC) Active Problems:   Essential hypertension   History of IBS   CKD (chronic kidney disease), stage III (HCC)   Nausea    This patient was discussed with the ED physician, including pertinent vitals, physical exam findings, labs, and imaging.  We also discussed care given by the ED provider.  1. A. fib with RVR a. Observation on telemetry b. Check  TSH tomorrow morning c. We will give metoprolol p.o. now as patient starting to tolerate sips and popsicles. d. Serial troponins 2. Nausea a. Antiemetics b. Clear liquids c. No imaging needed as patient does not have abdominal tenderness 3. CKD a. Currently stable 4. IBS a. Antidiarrheals and stool softeners available as needed 5. Hypertension a. Continue antihypertensives  DVT prophylaxis: On Xarelto Consultants: None Code Status: Full code Family Communication: Family friend present during interview and exam per patient permission Disposition Plan: Discharge to home tomorrow if tolerating medications and still in sinus rhythm   Truett Mainland, DO [facility] Pager [phone]  If 7PM-7AM, please contact night-coverage [email or web address] Password [email or web address]

## 2017-10-24 NOTE — Telephone Encounter (Signed)
Correct response 

## 2017-10-24 NOTE — ED Notes (Signed)
Called Dr. Wolfgang Phoenix and was instructed to come here.

## 2017-10-24 NOTE — ED Notes (Signed)
Called out to Dr Edson Snowball

## 2017-10-24 NOTE — ED Triage Notes (Signed)
Pt with nausea and emesis since Tuesday.  Diarrhea earlier this week but denies at present.

## 2017-10-24 NOTE — ED Notes (Signed)
Assisted patient with use of bed pain with 2 assist.

## 2017-10-24 NOTE — Consult Note (Addendum)
Cardiology Consult    Patient ID: Brenda Hall; [id number]; 1931/03/18   Admit date: 10/24/2017 Date of Consult: 10/24/2017  Primary Care Provider: [name], MD Primary Cardiologist: Carlyle Dolly, MD   Patient Profile    Brenda Hall is a 84 y.o. female with past medical history of CAD (s/p NSTEMI in 2007 with BMS to LCx), PAF (on Xarelto), HTN, HLD and Stage 3 CKD who is being seen today for the evaluation of atrial fibrillation with RVR at the request of Dr. Eulis Foster.   History of Present Illness    Ms. Hall was last examined by Dr. Harl Bowie in 08/2017 and denied any recent chest pain or dyspnea on exertion at that time.  She was continued on Toprol-XL for rate control along with Xarelto for anticoagulation.  In the interim, she was admitted to [facility] from 6/7 to 10/02/2017 for evaluation of worsening back pain and was found to have an acute L1 fracture. She underwent L1 balloon kyphoplasty on 09/30/2017 and was discharged to skilled nursing facility for rehabilitation. She was also treated for a UTI and CAP during her admission.  She presents to [facility] ED for evaluation of worsening vomiting over the past 2 days with associated weakness. She reports being discharged from SNF 3 days ago and had experienced an overall decreased appetite and episodes of vomiting while there but equated this due to the poor food selection. Upon returning home, she continued to have frequent vomiting and was only able to consume Jell-O and popsicles. Reports being unable to keep down any of her routine medications.  She denies any palpitations, chest discomfort, orthopnea, PND, or lower extremity edema.  Does report having fatigue and feeling slightly short of breath. Has also experienced intermittent diarrhea over the past 3 days.   Initial labs showed WBC 11.2, Hgb 13.2, platelets 576, Na+ 140, K+ 4.2, and creatinine 1.37. Initial troponin elevated to 0.10. CXR shows stable  cardiomegaly with central pulmonary vascular congestion. EKG shows atrial fibrillation with RVR, heart rate 174.  She has received intermittent doses of IV Lopressor but heart rate remains elevated in the 110's to 150's during this encounter.   Past Medical History:  Diagnosis Date  . Anemia   . CAD in native artery    a. NSTEMI 12/2005 s/p BMS to Cx, prior normal LVEF.  Marland Kitchen Cancer ([place]) 1988 and  2009   BCC nose and  Melanoma back  . Cancer of skin Jan. 2014   [facility]head [facility] Nose  . Carotid stenosis    mild  . Chronic back pain   . CKD (chronic kidney disease), stage III ([place])   . Coronary atherosclerosis of unspecified type of vessel, native or graft   . DJD (degenerative joint disease) of knee   . History of TIA (transient ischemic attack)   . HTN (hypertension)   . IBS (irritable bowel syndrome)   . Myocardial infarction [facility])    5 yrs ago     dr wall  cardiac  . Osteoporosis   . Other diseases of lung, not elsewhere classified    lung nodule  . Polymyalgia rheumatica ([place])   . Shingles 01/2016-02/2016  . Stroke [facility])     Past Surgical History:  Procedure Laterality Date  . ABDOMINAL HYSTERECTOMY  1977   partial  . BACK SURGERY  1982  . CARDIAC CATHETERIZATION    . COLONOSCOPY N/A 01/27/2013   Procedure: COLONOSCOPY;  Surgeon: Rogene Houston, MD;  Location: [facility];  Service: Endoscopy;  Laterality: N/A;  1030  . EYE SURGERY Bilateral 04/2017  . IR KYPHO LUMBAR INC FX REDUCE BONE BX UNI/BIL CANNULATION INC/IMAGING  09/30/2017  . IR VERTEBROPLASTY CERV/THOR BX INC UNI/BIL INC/INJECT/IMAGING  09/05/2017  . JOINT REPLACEMENT  2001 and  2011   6 knee surgeries   bil replacement  . KNEE ARTHROSCOPY    . LUMBAR LAMINECTOMY    . PR VEIN BYPASS GRAFT,AORTO-FEM-POP  2010  . removal of melanoma from back  2007  . SPINE SURGERY  2013   Broken back: fusion surgery  . tonisllectomy  1944  . total knee anthroplasty    . VERTEBROPLASTY  07/11/2011   Procedure:  VERTEBROPLASTY;  Surgeon: Faythe Ghee, MD;  Location: [place] NEURO ORS;  Service: Neurosurgery;  Laterality: N/A;  Vertebroplasty of Lumbar Two     Home Medications:  Prior to Admission medications   Medication Sig Start Date End Date Taking? Authorizing Provider  acetaminophen (TYLENOL) 500 MG tablet Take 500 mg by mouth every 6 (six) hours as needed for mild pain.   Yes [provider]  amLODipine (NORVASC) 5 MG tablet Take 5 mg by mouth daily.   Yes [provider]  aspirin 81 MG tablet Take 81 mg by mouth daily.   Yes [provider]  atorvastatin (LIPITOR) 80 MG tablet Take 1 tablet (80 mg total) by mouth at bedtime. 05/29/17  Yes [name], MD  Calcium Carbonate-Vit D-Min (CALCIUM 1200 PO) Take 1 tablet by mouth at bedtime.    Yes [provider]  estrogens, conjugated, (PREMARIN) 0.3 MG tablet Take 1 tablet (0.3 mg total) by mouth every evening. 02/14/17  Yes Johnson, Clanford L, MD  fluticasone (FLONASE) 50 MCG/ACT nasal spray USE TWO SPRAYS IN EACH NOSTRIL ONCE DAILY Patient taking differently: Place 1 spray into both nostrils daily as needed for allergies. USE TWO SPRAYS IN EACH NOSTRIL ONCE DAILY 12/03/16  Yes [name], MD  furosemide (LASIX) 20 MG tablet Take 20 mg DAILY AS NEEDED FOR SWELLING. Patient taking differently: Take 20 mg by mouth daily as needed for fluid. Take 20 mg DAILY AS NEEDED FOR SWELLING. 09/02/17  Yes Branch, Alphonse Guild, MD  lidocaine (LIDODERM) 5 % Place 1 patch onto the skin daily. Remove & Discard patch within 12 hours or as directed by MD 10/02/17  Yes Eugenie Filler, MD  loperamide (IMODIUM) 2 MG capsule Take 2 mg by mouth daily as needed for diarrhea or loose stools.    Yes [provider]  metoprolol succinate (TOPROL-XL) 50 MG 24 hr tablet Take 1 and 1/2 Tablets At Night Patient taking differently: Take 75 mg by mouth every evening. Take 1 and 1/2 Tablets At Night 04/17/17  Yes Lendon Colonel, NP  pantoprazole (PROTONIX) 40 MG tablet TAKE 1 TABLET EVERY MORNING 10/20/17  Yes Deveshwar, Abel Presto, MD  senna-docusate (SENOKOT-S) 8.6-50 MG tablet Take 1 tablet by mouth at bedtime as needed for mild constipation. 10/02/17  Yes Eugenie Filler, MD  traMADol (ULTRAM) 50 MG tablet Take 1-2 tablets (50-100 mg total) by mouth every 6 (six) hours as needed for moderate pain. 10/02/17  Yes Eugenie Filler, MD  XARELTO 15 MG TABS tablet TAKE 1 TABLET DAILY WITH SUPPER 09/30/17  Yes Arnoldo Lenis, MD  Zinc 50 MG CAPS Take 50 mg by mouth daily.   Yes [provider]  gabapentin (NEURONTIN) 100 MG capsule Take 1 capsule (100  mg total) by mouth at bedtime. Patient not taking: Reported on 09/27/2017 08/27/17   Jessy Oto, MD  methocarbamol (ROBAXIN) 500 MG tablet Take 2 tablets (1,000 mg total) by mouth every 8 (eight) hours as needed for muscle spasms. 10/02/17   Eugenie Filler, MD  nitroGLYCERIN (NITROSTAT) 0.4 MG SL tablet Place 1 tablet (0.4 mg total) under the tongue every 5 (five) minutes as needed for chest pain. 12/03/16   [name], MD  predniSONE (DELTASONE) 5 MG tablet TAKE 1 TABLET BY MOUTH DAILY WITH BREAKFAST(TAPER BY 1 MG EVERY MONTH) Patient not taking: Reported on 10/24/2017 09/18/17   Bo Merino, MD    Inpatient Medications: Scheduled Meds:  Continuous Infusions:  PRN Meds:   Allergies:    Allergies  Allergen Reactions  . Epinephrine Other (See Comments)    During dental procedure-caused facial swelling at injection site. Pt has had epinephrine since then for nose and facial surgery and did fine with it per pt.  . Clopidogrel Bisulfate Itching and Rash    Plavix  . Oxycodone     hallucinations    Social History:   Social History   Socioeconomic History  . Marital status: Widowed    Spouse name: Not on file  . Number of children: Not on file  . Years of education: Not on file  . Highest education level: Not on file  Occupational  History  . Not on file  Social Needs  . Financial resource strain: Not on file  . Food insecurity:    Worry: Not on file    Inability: Not on file  . Transportation needs:    Medical: Not on file    Non-medical: Not on file  Tobacco Use  . Smoking status: Never Smoker  . Smokeless tobacco: Never Used  . Tobacco comment: tobacco use - no  Substance and Sexual Activity  . Alcohol use: Yes    Alcohol/week: 0.0 oz    Comment: occasionally a glass of wine or margarita per pt  . Drug use: No  . Sexual activity: Not on file  Lifestyle  . Physical activity:    Days per week: Not on file    Minutes per session: Not on file  . Stress: Not on file  Relationships  . Social connections:    Talks on phone: Not on file    Gets together: Not on file    Attends religious service: Not on file    Active member of club or organization: Not on file    Attends meetings of clubs or organizations: Not on file    Relationship status: Not on file  . Intimate partner violence:    Fear of current or ex partner: Not on file    Emotionally abused: Not on file    Physically abused: Not on file    Forced sexual activity: Not on file  Other Topics Concern  . Not on file  Social History Narrative   Married, retired. 3 children. Regular exercise -yes; hobbies = swimming.     Family History:    Family History  Problem Relation Age of Onset  . Heart disease Father   . Hypertension Father   . Heart attack Father   . Cancer Mother        bladder   . Hypertension Daughter   . Atrial fibrillation Daughter   . Hypertension Son   . Colon cancer Neg Hx       Review of Systems  General:  No chills, fever, night sweats or weight changes. Positive for fatigue. Cardiovascular:  No chest pain, dyspnea on exertion, edema, orthopnea, palpitations, paroxysmal nocturnal dyspnea. Dermatological: No rash, lesions/masses Respiratory: No cough, dyspnea Urologic: No hematuria, dysuria Abdominal:   No  bright red blood per rectum, melena, or hematemesis. Positive for nausea, vomiting, and diarrhea.  Neurologic:  No visual changes, changes in mental status. Positive for weakness.   All other systems reviewed and are otherwise negative except as noted above.  Physical Exam/Data    Vitals:   10/24/17 1500 10/24/17 1515 10/24/17 1530 10/24/17 1600  BP: (!) 131/106  (!) 149/85 (!) 124/92  Pulse:  67  (!) 44  Resp: (!) 21 14 19  (!) 27  Temp:      TempSrc:      SpO2:  98%  96%  Weight:      Height:       No intake or output data in the 24 hours ending 10/24/17 1639 Filed Weights   10/24/17 1223  Weight: 150 lb (68 kg)   Body mass index is 24.96 kg/m.   General: Pleasant, elderly Caucasian female appearing in NAD. Psych: Normal affect. Neuro: Alert and oriented X 3. Moves all extremities spontaneously. HEENT: Normal  Neck: Supple without bruits or JVD. Lungs:  Resp regular and unlabored, decreased breath sounds along bases bilaterally. Heart: Irregularly irregular, no s3, s4, or murmurs. Abdomen: Soft, non-tender, non-distended, BS + x 4.  Extremities: No clubbing, cyanosis or edema. DP/PT/Radials 2+ and equal bilaterally.  Labs/Studies     Relevant CV Studies:  Echocardiogram: 01/2017 Study Conclusions  - Left ventricle: The cavity size was normal. Wall thickness was   increased in a pattern of mild to moderate LVH. Systolic function   was normal. The estimated ejection fraction was in the range of   60% to 65%. Wall motion was normal; there were no regional wall   motion abnormalities. Doppler parameters are consistent with   abnormal left ventricular relaxation (grade 1 diastolic   dysfunction). - Aortic valve: Mildly calcified annulus. Trileaflet; mildly   calcified leaflets. There was no stenosis. - Mitral valve: There was mild regurgitation. - Tricuspid valve: There was mild regurgitation.   Laboratory Data:  Chemistry Recent Labs  Lab 10/24/17 1359  10/24/17 1404  NA 140 140  K 4.2 4.1  CL 103 102  CO2 24  --   GLUCOSE 106* 98  BUN 18 17  CREATININE 1.37* 1.20*  CALCIUM 9.2  --   GFRNONAA 34*  --   GFRAA 40*  --   ANIONGAP 13  --     Recent Labs  Lab 10/24/17 1359  PROT 6.9  ALBUMIN 3.2*  AST 26  ALT 16  ALKPHOS 77  BILITOT 0.9   Hematology Recent Labs  Lab 10/24/17 1359 10/24/17 1404  WBC 11.2*  --   RBC 4.53  --   HGB 13.2 14.6  HCT 42.0 43.0  MCV 92.7  --   MCH 29.1  --   MCHC 31.4  --   RDW 14.2  --   PLT 576*  --    Cardiac EnzymesNo results for input(s): TROPONINI in the last 168 hours.  Recent Labs  Lab 10/24/17 1402  TROPIPOC 0.10*    BNPNo results for input(s): BNP, PROBNP in the last 168 hours.  DDimer No results for input(s): DDIMER in the last 168 hours.  Radiology/Studies:  Dg Chest Portable 1 View  Result Date: 10/24/2017 CLINICAL DATA:  Tachycardia. EXAM: PORTABLE CHEST 1 VIEW COMPARISON:  Radiograph of [place] 10, 2019. FINDINGS: Stable cardiomegaly with central pulmonary vascular congestion is noted. No pneumothorax or significant pleural effusion is noted. No consolidative process is noted. Bony thorax is unremarkable. IMPRESSION: Stable cardiomegaly with central pulmonary vascular congestion. Electronically Signed   By: Marijo Conception, M.D.   On: 10/24/2017 14:57     Assessment & Plan    1. Atrial Fibrillation with RVR - The patient has a known history of atrial fibrillation dating back to 01/2017 during which time she converted to normal sinus rhythm with IV Cardizem and had maintained normal sinus rhythm until now by her report.  - She presents with worsening nausea, vomiting, diarrhea, and decreased appetite over the past several days and was found to have atrial fibrillation with RVR while in the ED with heart rate elevated into the 180s. K+ at 4.2. Will check TSH and Mg. She has received intermittent doses of IV Lopressor with some improvement in her rates. Heart rate is still  variable from the 110s to 150s at the time of this encounter, therefore would recommend initiation of IV Cardizem for rate-control. Can switch back to PO medications once her N/V has resolved (was on Toprol-XL 75mg  daily prior to admission).  - she is on Xarelto 15mg  daily for anticoagulation but has been unable to keep this down for several days. If she remains unable to take PO medications, would start Heparin and switch back to Xarelto prior to discharge. Not a candidate for DCCV at this time given her missed doses of anticoagulation.   2. Elevated Troponin/ CAD - She does have a history of BMS to LCx in 2007 but denies any recent chest discomfort or dyspnea on exertion. - Initial troponin elevated to 0.10 while in the ED. Would continue to cycle enzymes and if this trend remains flat, it is likely secondary to demand ischemia in the setting of atrial fibrillation with RVR. If continues to trend upwards, would obtain a repeat echocardiogram for initial assessment but would wait for improvement in her heart rate.  3. HTN - BP is currently at 155/85. Will plan to start IV Cardizem as outlined above. Would hold Amlodipine in the setting of using Cardizem and to allow for titration of AV nodal blocking agents.   4. HLD - FLP in 05/2017 showed total cholesterol 163, triglycerides 103, HDL 75, and LDL 67.  At goal of LDL less than 70 in the setting of known CAD. Plan to resume PTA Atorvastatin 80 mg daily once able to take PO medications.  5. Nausea/Vomiting/Diarrhea - Reports having symptoms for the past 3 days. Denies any melena or hematochezia. Will need further work-up for this throughout admission.Recommend checking for C. difficile given that she was just discharged from SNF. - per admitting team.    For questions or updates, please contact [place] Please consult [email or web address] for contact info under Cardiology/STEMI.  Signed, Erma Heritage, PA-C 10/24/2017, 4:39 PM Pager:  [phone]   The patient was seen and examined, and I agree with the history, physical exam, assessment and plan as documented above, with modifications as noted below. I have also personally reviewed all relevant documentation, old records, labs, and both radiographic and cardiovascular studies. I have also independently interpreted old and new ECG's.  Briefly, this is an 84 year old woman whom I evaluated for new onset rapid atrial fibrillation in October 2018.  At that time I started her on renally dose Xarelto.  She  also has a history of coronary disease with PCI to the circumflex for non-STEMI in 2007.  As stated above, she was hospitalized for an acute L1 fracture and underwent balloon kyphoplasty on Petrina 11 and subsequently discharged to a skilled nursing facility for rehabilitation.  She said over the past several days she has had increased nausea and vomiting.  She was able to tolerate Jell-O and popsicles but everything else led to vomiting.  She denies chest pain and palpitations as well as leg swelling.  She believes the last time she was in atrial fibrillation was back in October 2018 when I first evaluated her.  Labs reviewed above with minimally elevated point-of-care troponin at 0.1, leukocytosis with white count of 11.2, probable reactive thrombocytosis with elevated platelet count of 576, normal urinalysis, normal lipase, and elevated creatinine of 1.37.  I reviewed the chest x-ray which showed stable cardiomegaly with central pulmonary vascular congestion.  I personally reviewed the ECG which demonstrated rapid atrial fibrillation, 174 bpm.  Echocardiogram on 02/13/2017 showed normal left ventricular systolic function and regional wall motion, LVEF 60 to 65%, mild to moderate LVH, grade 1 diastolic dysfunction, and mild mitral and tricuspid regurgitation.  She has been receiving doses of IV metoprolol with no significant decrease in heart rate.  At the time of my evaluation  heart rate was in the 130-150 bpm range.  I would recommend starting an intravenous diltiazem infusion for heart rate control.  She has not been able to tolerate renally dose Xarelto given constant nausea and vomiting, I would recommend IV heparin.  She is not a candidate for direct current cardioversion as she has missed several doses of anticoagulation.  I suspect her minimally elevated point-of-care troponin is due to supply demand mismatch in the setting of rapid atrial fibrillation.  I agree with checking for C. difficile given her recent discharge from a skilled nursing facility.   Kate Sable, MD, [facility]  10/24/2017 5:28 PM

## 2017-10-24 NOTE — ED Provider Notes (Signed)
[facility] EMERGENCY DEPARTMENT Provider Note   CSN: [id number] Arrival date & time: 10/24/17  1153     History   Chief Complaint Chief Complaint  Patient presents with  . Emesis    since Tuesday    HPI Brenda Hall is a 84 y.o. female.  HPI    She presents for evaluation of weakness, with nausea and vomiting for several days, since being discharged from rehab, where she was staying for assistance  with recovery from back surgery.  No hematemesis.  No fever.  No chest pain.  She denies a sensation of palpitations.  History of A. fib, on Xarelto.  No known sick contacts, or suspected abnormal food ingestions.  There are no other known modifying factors.   Past Medical History:  Diagnosis Date  . Anemia   . CAD in native artery    a. NSTEMI 12/2005 s/p BMS to Cx, prior normal LVEF.  Marland Kitchen Cancer ([place]) 1988 and  2009   BCC nose and  Melanoma back  . Cancer of skin Jan. 2014   [facility]  . Carotid stenosis    mild  . Chronic back pain   . CKD (chronic kidney disease), stage III ([place])   . Coronary atherosclerosis of unspecified type of vessel, native or graft   . DJD (degenerative joint disease) of knee   . History of TIA (transient ischemic attack)   . HTN (hypertension)   . IBS (irritable bowel syndrome)   . Myocardial infarction [facility])    5 yrs ago     dr wall  cardiac  . Osteoporosis   . Other diseases of lung, not elsewhere classified    lung nodule  . Polymyalgia rheumatica ([place])   . Shingles 01/2016-02/2016  . Stroke [facility]     Patient Active Problem List   Diagnosis Date Noted  . Acute bilateral low back pain without sciatica   . E. coli UTI   . Community acquired pneumonia   . L1 vertebral fracture ([place]) 09/27/2017  . UTI (urinary tract infection) 09/27/2017  . CKD (chronic kidney disease), stage III ([place])   . History of TIA (transient ischemic attack)   . PAF (paroxysmal atrial fibrillation) ([place])   . Atrial fibrillation with rapid  ventricular response ([place]) 02/12/2017  . Hyperlipidemia 02/12/2017  . Pulmonary edema 02/12/2017  . Urgency of micturition 02/12/2017  . Osteopenia of multiple sites 07/23/2016  . Age-related osteoporosis without current pathological fracture 07/11/2016  . History of total knee replacement, bilateral 07/11/2016  . History of COPD 07/11/2016  . Spondylosis of lumbar region without myelopathy or radiculopathy 07/11/2016  . Primary osteoarthritis of both hands 07/11/2016  . Primary osteoarthritis of both feet 07/11/2016  . Vitamin D deficiency 07/11/2016  . History of vertebral fracture 07/11/2016  . History of IBS 07/11/2016  . Basal cell carcinoma 07/11/2016  . History of CVA (cerebrovascular accident)/ has metal implant not MRI safe  07/11/2016  . Other and unspecified hyperlipidemia 11/19/2012  . Cerebrovascular accident (stroke) ([place]) 11/19/2012  . Difficulty in walking(719.7) 11/06/2012  . Occlusion and stenosis of carotid artery without mention of cerebral infarction 02/07/2012  . OLD MYOCARDIAL INFARCTION 11/07/2009  . CAD, NATIVE VESSEL 11/07/2009  . Essential hypertension 09/28/2008  . IRRITABLE BOWEL SYNDROME 09/28/2008  . DEGENERATIVE JOINT DISEASE, KNEE 09/28/2008  . POLYMYALGIA RHEUMATICA 09/28/2008  . Chest pain, unspecified 09/28/2008    Past Surgical History:  Procedure Laterality Date  . ABDOMINAL HYSTERECTOMY  1977  partial  . [place]  . CARDIAC CATHETERIZATION    . COLONOSCOPY N/A 01/27/2013   Procedure: COLONOSCOPY;  Surgeon: Rogene Houston, MD;  Location: [facility] ENDO SUITE;  Service: Endoscopy;  Laterality: N/A;  1030  . EYE SURGERY Bilateral 04/2017  . IR KYPHO LUMBAR INC FX REDUCE BONE BX UNI/BIL CANNULATION INC/IMAGING  09/30/2017  . IR VERTEBROPLASTY CERV/THOR BX INC UNI/BIL INC/INJECT/IMAGING  09/05/2017  . JOINT REPLACEMENT  2001 and  2011   6 knee surgeries   bil replacement  . KNEE ARTHROSCOPY    . LUMBAR LAMINECTOMY    . PR VEIN BYPASS  GRAFT,AORTO-FEM-POP  2010  . removal of melanoma from back  2007  . SPINE SURGERY  2013   Broken back: fusion surgery  . tonisllectomy  1944  . total knee anthroplasty    . VERTEBROPLASTY  07/11/2011   Procedure: VERTEBROPLASTY;  Surgeon: Faythe Ghee, MD;  Location: [facility];  Service: Neurosurgery;  Laterality: N/A;  Vertebroplasty of Lumbar Two     OB History   None      Home Medications    Prior to Admission medications   Medication Sig Start Date End Date Taking? Authorizing Provider  acetaminophen (TYLENOL) 500 MG tablet Take 500 mg by mouth every 6 (six) hours as needed for mild pain.   Yes [provider]  amLODipine (NORVASC) 5 MG tablet Take 5 mg by mouth daily.   Yes [provider]  aspirin 81 MG tablet Take 81 mg by mouth daily.   Yes [provider]  atorvastatin (LIPITOR) 80 MG tablet Take 1 tablet (80 mg total) by mouth at bedtime. 05/29/17  Yes [name], MD  Calcium Carbonate-Vit D-Min (CALCIUM 1200 PO) Take 1 tablet by mouth at bedtime.    Yes [provider]  estrogens, conjugated, (PREMARIN) 0.3 MG tablet Take 1 tablet (0.3 mg total) by mouth every evening. 02/14/17  Yes Johnson, Clanford L, MD  fluticasone (FLONASE) 50 MCG/ACT nasal spray USE TWO SPRAYS IN EACH NOSTRIL ONCE DAILY Patient taking differently: Place 1 spray into both nostrils daily as needed for allergies. USE TWO SPRAYS IN EACH NOSTRIL ONCE DAILY 12/03/16  Yes [name], MD  furosemide (LASIX) 20 MG tablet Take 20 mg DAILY AS NEEDED FOR SWELLING. Patient taking differently: Take 20 mg by mouth daily as needed for fluid. Take 20 mg DAILY AS NEEDED FOR SWELLING. 09/02/17  Yes Branch, Alphonse Guild, MD  lidocaine (LIDODERM) 5 % Place 1 patch onto the skin daily. Remove & Discard patch within 12 hours or as directed by MD 10/02/17  Yes Eugenie Filler, MD  loperamide (IMODIUM) 2 MG capsule Take 2 mg by mouth daily as needed for diarrhea or loose  stools.    Yes [provider]  metoprolol succinate (TOPROL-XL) 50 MG 24 hr tablet Take 1 and 1/2 Tablets At Night Patient taking differently: Take 75 mg by mouth every evening. Take 1 and 1/2 Tablets At Night 04/17/17  Yes Lendon Colonel, NP  pantoprazole (PROTONIX) 40 MG tablet TAKE 1 TABLET EVERY MORNING 10/20/17  Yes Deveshwar, Abel Presto, MD  senna-docusate (SENOKOT-S) 8.6-50 MG tablet Take 1 tablet by mouth at bedtime as needed for mild constipation. 10/02/17  Yes Eugenie Filler, MD  traMADol (ULTRAM) 50 MG tablet Take 1-2 tablets (50-100 mg total) by mouth every 6 (six) hours as needed for moderate pain. 10/02/17  Yes Eugenie Filler, MD  XARELTO 15 MG TABS  tablet TAKE 1 TABLET DAILY WITH SUPPER 09/30/17  Yes Branch, Alphonse Guild, MD  Zinc 50 MG CAPS Take 50 mg by mouth daily.   Yes [provider]  gabapentin (NEURONTIN) 100 MG capsule Take 1 capsule (100 mg total) by mouth at bedtime. Patient not taking: Reported on 09/27/2017 08/27/17   Jessy Oto, MD  methocarbamol (ROBAXIN) 500 MG tablet Take 2 tablets (1,000 mg total) by mouth every 8 (eight) hours as needed for muscle spasms. 10/02/17   Eugenie Filler, MD  nitroGLYCERIN (NITROSTAT) 0.4 MG SL tablet Place 1 tablet (0.4 mg total) under the tongue every 5 (five) minutes as needed for chest pain. 12/03/16   [name], MD  predniSONE (DELTASONE) 5 MG tablet TAKE 1 TABLET BY MOUTH DAILY WITH BREAKFAST(TAPER BY 1 MG EVERY MONTH) Patient not taking: Reported on 10/24/2017 09/18/17   Bo Merino, MD    Family History Family History  Problem Relation Age of Onset  . Heart disease Father   . Hypertension Father   . Heart attack Father   . Cancer Mother        bladder   . Hypertension Daughter   . Atrial fibrillation Daughter   . Hypertension Son   . Colon cancer Neg Hx     Social History Social History   Tobacco Use  . Smoking status: Never Smoker  . Smokeless tobacco: Never Used  . Tobacco  comment: tobacco use - no  Substance Use Topics  . Alcohol use: Yes    Alcohol/week: 0.0 oz    Comment: occasionally a glass of wine or margarita per pt  . Drug use: No     Allergies   Epinephrine; Clopidogrel bisulfate; and Oxycodone   Review of Systems Review of Systems  All other systems reviewed and are negative.    Physical Exam Updated Vital Signs BP (!) 124/92   Pulse (!) 44   Temp 98.3 F (36.8 C) (Oral)   Resp (!) 27   Ht 5\' 5"  (1.651 m)   Wt 68 kg (150 lb)   SpO2 96%   BMI 24.96 kg/m   Physical Exam  Constitutional: She is oriented to person, place, and time. She appears well-developed.  Elderly, frail  HENT:  Head: Normocephalic and atraumatic.  Eyes: Pupils are equal, round, and reactive to light. Conjunctivae and EOM are normal.  Neck: Normal range of motion and phonation normal. Neck supple.  Cardiovascular:  Regular tachycardia.  Effective pulse rate at left radial artery, 76.  Pulmonary/Chest: Effort normal and breath sounds normal. She exhibits no tenderness.  Abdominal: Soft. She exhibits no distension. There is no tenderness. There is no guarding.  Musculoskeletal: Normal range of motion.  Neurological: She is alert and oriented to person, place, and time. She exhibits normal muscle tone.  Skin: Skin is warm and dry.  Psychiatric: She has a normal mood and affect. Her behavior is normal. Judgment and thought content normal.  Nursing note and vitals reviewed.    ED Treatments / Results  Labs (all labs ordered are listed, but only abnormal results are displayed) Labs Reviewed  COMPREHENSIVE METABOLIC PANEL - Abnormal; Notable for the following components:      Result Value   Glucose, Bld 106 (*)    Creatinine, Ser 1.37 (*)    Albumin 3.2 (*)    GFR calc non Af Amer 34 (*)    GFR calc Af Amer 40 (*)    All other components within normal limits  CBC - Abnormal; Notable for the following components:   WBC 11.2 (*)    Platelets 576 (*)     All other components within normal limits  I-STAT CHEM 8, ED - Abnormal; Notable for the following components:   Creatinine, Ser 1.20 (*)    Calcium, Ion 1.12 (*)    All other components within normal limits  I-STAT TROPONIN, ED - Abnormal; Notable for the following components:   Troponin i, poc 0.10 (*)    All other components within normal limits  LIPASE, BLOOD  URINALYSIS, ROUTINE W REFLEX MICROSCOPIC    EKG EKG Interpretation  Date/Time:  Friday October 24 2017 13:51:17 EDT Ventricular Rate:  174 PR Interval:    QRS Duration: 77 QT Interval:  240 QTC Calculation: 409 R Axis:   100 Text Interpretation:  Atrial fibrillation with rapid V-rate Right axis deviation Repolarization abnormality, prob rate related Since last tracing rate faster and atrial fibrillation is new Confirmed by Daleen Bo [phone]) on 10/24/2017 2:08:55 PM  CHA2DS2/VAS Stroke Risk Points  Current as of 6 minutes ago     8 >= 2 Points: High Risk  1 - 1.99 Points: Medium Risk  0 Points: Low Risk    The patient's score has not changed in the past year.:  No Change     Details    This score determines the patient's risk of having a stroke if the  patient has atrial fibrillation.       Points Metrics  1 Has Congestive Heart Failure:  Yes    Current as of 6 minutes ago  1 Has Vascular Disease:  Yes    Current as of 6 minutes ago  1 Has Hypertension:  Yes    Current as of 6 minutes ago  2 Age:  41    Current as of 6 minutes ago  0 Has Diabetes:  No    Current as of 6 minutes ago  2 Had Stroke:  Yes  Had TIA:  No  Had thromboembolism:  No    Current as of 6 minutes ago  1 Female:  Yes    Current as of 6 minutes ago           Radiology Dg Chest Portable 1 View  Result Date: 10/24/2017 CLINICAL DATA:  Tachycardia. EXAM: PORTABLE CHEST 1 VIEW COMPARISON:  Radiograph of [place] 10, 2019. FINDINGS: Stable cardiomegaly with central pulmonary vascular congestion is noted. No pneumothorax or significant  pleural effusion is noted. No consolidative process is noted. Bony thorax is unremarkable. IMPRESSION: Stable cardiomegaly with central pulmonary vascular congestion. Electronically Signed   By: Marijo Conception, M.D.   On: 10/24/2017 14:57    Procedures .Critical Care Performed by: Daleen Bo, MD Authorized by: Daleen Bo, MD   Critical care provider statement:    Critical care time (minutes):  35   Critical care start time:  10/24/2017 2:00 PM   Critical care end time:  10/24/2017 4:10 PM   Critical care time was exclusive of:  Separately billable procedures and treating other patients   Critical care was necessary to treat or prevent imminent or life-threatening deterioration of the following conditions:  Cardiac failure   Critical care was time spent personally by me on the following activities:  Blood draw for specimens, development of treatment plan with patient or surrogate, discussions with consultants, evaluation of patient's response to treatment, examination of patient, obtaining history from patient or surrogate, ordering and performing treatments and interventions, ordering and  review of laboratory studies, pulse oximetry, re-evaluation of patient's condition, review of old charts and ordering and review of radiographic studies   (including critical care time)  Medications Ordered in ED Medications  metoprolol tartrate (LOPRESSOR) injection 5 mg (5 mg Intravenous Given 10/24/17 1441)  metoprolol tartrate (LOPRESSOR) injection 5 mg (5 mg Intravenous Given 10/24/17 1632)     Initial Impression / Assessment and Plan / ED Course  I have reviewed the triage vital signs and the nursing notes.  Pertinent labs & imaging results that were available during my care of the patient were reviewed by me and considered in my medical decision making (see chart for details).  Clinical Course as of Oct 25 1638  Fri Oct 24, 2017  1406 Patient with atrial fibrillation, and rapid ventricular  rate, with a low pulse for rate, indicating perfusion approximately every other beat.  Likely volume related so will give IV fluids.  Will assess for causes of vomiting.   [EW]  1606 Mild elevation  I-Stat Troponin, ED (not at [facility])(!!) [EW]  1607 Normal except creatinine 1.2 and calcium 1.1  I-stat chem 8, ed(!) [EW]  1607 Normal  Lipase, blood [EW]  1607 Normal except white count low 11.2, and platelets high 576  CBC(!) [EW]  1607 Normal except glucose high 106, creatinine high 1.4, albumin low 3.2, GFR low 34  Comprehensive metabolic panel(!) [EW]  7322 Case discussed with Dr. Jetty Duhamel, regarding her elevated troponin.  He states that the patient can be managed in [place] without convention by cardiology at this time.   [EW]  1618 Patient's heart rate is now persistently mid 130s, still irregular, following last treatment with metoprolol at 2:41 PM.  Repeat dose of metoprolol ordered, IV.   [EW]  1640 Normal  Urinalysis, Routine w reflex microscopic [EW]    Clinical Course User Index [EW] Daleen Bo, MD     Patient Vitals for the past 24 hrs:  BP Temp Temp src Pulse Resp SpO2 Height Weight  10/24/17 1600 (!) 124/92 - - (!) 44 (!) 27 96 % - -  10/24/17 1530 (!) 149/85 - - - 19 - - -  10/24/17 1515 - - - 67 14 98 % - -  10/24/17 1500 (!) 131/106 - - - (!) 21 - - -  10/24/17 1430 129/88 - - - (!) 23 - - -  10/24/17 1226 122/84 98.3 F (36.8 C) Oral 81 12 98 % - -  10/24/17 1223 - - - - - - 5\' 5"  (1.651 m) 68 kg (150 lb)    4:09 PM Reevaluation with update and discussion. After initial assessment and treatment, an updated evaluation reveals she is tolerating some ice chips without vomiting.  She continues to deny chest pain.  Findings discussed with the patient and all questions answered. Daleen Bo   Medical Decision Making: Nonspecific ongoing nausea and vomiting, following recent rehab stay.  Patient with significant elevated heart rate, required treatment with IV  fluids, and Lopressor to improve her rate.  Mild troponin elevation likely rate related and not indicating an acute coronary event.  Patient does not have any chest pain.  She will require hospitalization for management.  CRITICAL CARE-yes Performed by: Daleen Bo   Nursing Notes Reviewed/ Care Coordinated Applicable Imaging Reviewed Interpretation of Laboratory Data incorporated into ED treatment   4:21 PM-Consult complete with hospitalist. Patient case explained and discussed.  He agrees to admit patient for further evaluation and treatment. Call ended at 4:18 PM  Plan: Admit   Final Clinical Impressions(s) / ED Diagnoses   Final diagnoses:  Atrial fibrillation with RVR (HCC)  Non-intractable vomiting with nausea, unspecified vomiting type    ED Discharge Orders    None       Daleen Bo, MD 10/24/17 [phone]

## 2017-10-24 NOTE — ED Notes (Signed)
Performed bladder scan. Showed 162 ml urine in bladder at this time.

## 2017-10-24 NOTE — ED Notes (Signed)
Gave ice chips as requested and approved by physician.

## 2017-10-24 NOTE — ED Notes (Signed)
Date and time results received: 10/24/17 1412  Test: I-stat Troponin Critical Value: 0.10  Name of Provider Notified: Eulis Foster  Orders Received? Or Actions Taken?: no new orders at this time.

## 2017-10-24 NOTE — Telephone Encounter (Signed)
Spoke with patient who stated she just got home from [facility] for rehab after back surgery. Patient stated she was released 2 days ago and has been vomiting-can't eat or swallow since coming home. Patient stated she has not had her meds in 2 days also and is extremely weak and feeling terrible-patient tearful on phone. Advised patient to go straight to the ER for evaluation and treatment. Patient verbalized understanding and stated she would have someone to take her to the ER.

## 2017-10-25 ENCOUNTER — Other Ambulatory Visit: Payer: Self-pay

## 2017-10-25 DIAGNOSIS — E785 Hyperlipidemia, unspecified: Secondary | ICD-10-CM | POA: Diagnosis present

## 2017-10-25 DIAGNOSIS — M47812 Spondylosis without myelopathy or radiculopathy, cervical region: Secondary | ICD-10-CM | POA: Diagnosis not present

## 2017-10-25 DIAGNOSIS — M353 Polymyalgia rheumatica: Secondary | ICD-10-CM | POA: Diagnosis present

## 2017-10-25 DIAGNOSIS — Z888 Allergy status to other drugs, medicaments and biological substances status: Secondary | ICD-10-CM | POA: Diagnosis not present

## 2017-10-25 DIAGNOSIS — Z8719 Personal history of other diseases of the digestive system: Secondary | ICD-10-CM

## 2017-10-25 DIAGNOSIS — I4891 Unspecified atrial fibrillation: Secondary | ICD-10-CM | POA: Diagnosis not present

## 2017-10-25 DIAGNOSIS — Z7982 Long term (current) use of aspirin: Secondary | ICD-10-CM | POA: Diagnosis not present

## 2017-10-25 DIAGNOSIS — Z809 Family history of malignant neoplasm, unspecified: Secondary | ICD-10-CM | POA: Diagnosis not present

## 2017-10-25 DIAGNOSIS — D631 Anemia in chronic kidney disease: Secondary | ICD-10-CM | POA: Diagnosis present

## 2017-10-25 DIAGNOSIS — Z79899 Other long term (current) drug therapy: Secondary | ICD-10-CM | POA: Diagnosis not present

## 2017-10-25 DIAGNOSIS — N183 Chronic kidney disease, stage 3 (moderate): Secondary | ICD-10-CM | POA: Diagnosis not present

## 2017-10-25 DIAGNOSIS — Z90710 Acquired absence of both cervix and uterus: Secondary | ICD-10-CM | POA: Diagnosis not present

## 2017-10-25 DIAGNOSIS — K589 Irritable bowel syndrome without diarrhea: Secondary | ICD-10-CM | POA: Diagnosis present

## 2017-10-25 DIAGNOSIS — Z8673 Personal history of transient ischemic attack (TIA), and cerebral infarction without residual deficits: Secondary | ICD-10-CM | POA: Diagnosis not present

## 2017-10-25 DIAGNOSIS — I480 Paroxysmal atrial fibrillation: Secondary | ICD-10-CM | POA: Diagnosis present

## 2017-10-25 DIAGNOSIS — Z981 Arthrodesis status: Secondary | ICD-10-CM | POA: Diagnosis not present

## 2017-10-25 DIAGNOSIS — I10 Essential (primary) hypertension: Secondary | ICD-10-CM | POA: Diagnosis not present

## 2017-10-25 DIAGNOSIS — Z885 Allergy status to narcotic agent status: Secondary | ICD-10-CM | POA: Diagnosis not present

## 2017-10-25 DIAGNOSIS — I2510 Atherosclerotic heart disease of native coronary artery without angina pectoris: Secondary | ICD-10-CM | POA: Diagnosis present

## 2017-10-25 DIAGNOSIS — I252 Old myocardial infarction: Secondary | ICD-10-CM | POA: Diagnosis not present

## 2017-10-25 DIAGNOSIS — I129 Hypertensive chronic kidney disease with stage 1 through stage 4 chronic kidney disease, or unspecified chronic kidney disease: Secondary | ICD-10-CM | POA: Diagnosis present

## 2017-10-25 DIAGNOSIS — M542 Cervicalgia: Secondary | ICD-10-CM | POA: Diagnosis not present

## 2017-10-25 DIAGNOSIS — Z7901 Long term (current) use of anticoagulants: Secondary | ICD-10-CM | POA: Diagnosis not present

## 2017-10-25 DIAGNOSIS — R110 Nausea: Secondary | ICD-10-CM

## 2017-10-25 DIAGNOSIS — Z85820 Personal history of malignant melanoma of skin: Secondary | ICD-10-CM | POA: Diagnosis not present

## 2017-10-25 DIAGNOSIS — R112 Nausea with vomiting, unspecified: Secondary | ICD-10-CM | POA: Diagnosis not present

## 2017-10-25 DIAGNOSIS — G8929 Other chronic pain: Secondary | ICD-10-CM | POA: Diagnosis present

## 2017-10-25 DIAGNOSIS — Z96653 Presence of artificial knee joint, bilateral: Secondary | ICD-10-CM | POA: Diagnosis present

## 2017-10-25 DIAGNOSIS — Z79890 Hormone replacement therapy (postmenopausal): Secondary | ICD-10-CM | POA: Diagnosis not present

## 2017-10-25 DIAGNOSIS — Z7951 Long term (current) use of inhaled steroids: Secondary | ICD-10-CM | POA: Diagnosis not present

## 2017-10-25 LAB — CBC WITH DIFFERENTIAL/PLATELET
BASOS PCT: 0 %
Basophils Absolute: 0.1 10*3/uL (ref 0.0–0.1)
EOS ABS: 0.1 10*3/uL (ref 0.0–0.7)
Eosinophils Relative: 0 %
HEMATOCRIT: 38.5 % (ref 36.0–46.0)
HEMOGLOBIN: 11.6 g/dL — AB (ref 12.0–15.0)
Lymphocytes Relative: 20 %
Lymphs Abs: 1.6 10*3/uL (ref 0.7–4.0)
MCH: 28.1 pg (ref 26.0–34.0)
MCHC: 30.1 g/dL (ref 30.0–36.0)
MCV: 93.2 fL (ref 78.0–100.0)
MONO ABS: 1 10*3/uL (ref 0.1–1.0)
Monocytes Relative: 13 %
NEUTROS ABS: 5.3 10*3/uL (ref 1.7–7.7)
NEUTROS PCT: 67 %
Platelets: 499 10*3/uL — ABNORMAL HIGH (ref 150–400)
RBC: 4.13 MIL/uL (ref 3.87–5.11)
RDW: 14.2 % (ref 11.5–15.5)
WBC: 8 10*3/uL (ref 4.0–10.5)

## 2017-10-25 LAB — MAGNESIUM
MAGNESIUM: 1.6 mg/dL — AB (ref 1.7–2.4)
MAGNESIUM: 2.5 mg/dL — AB (ref 1.7–2.4)

## 2017-10-25 LAB — COMPREHENSIVE METABOLIC PANEL
ALBUMIN: 2.7 g/dL — AB (ref 3.5–5.0)
ALT: 12 U/L (ref 0–44)
ANION GAP: 10 (ref 5–15)
AST: 16 U/L (ref 15–41)
Alkaline Phosphatase: 66 U/L (ref 38–126)
BILIRUBIN TOTAL: 1.1 mg/dL (ref 0.3–1.2)
BUN: 18 mg/dL (ref 8–23)
CHLORIDE: 108 mmol/L (ref 98–111)
CO2: 22 mmol/L (ref 22–32)
Calcium: 8.6 mg/dL — ABNORMAL LOW (ref 8.9–10.3)
Creatinine, Ser: 1.12 mg/dL — ABNORMAL HIGH (ref 0.44–1.00)
GFR calc Af Amer: 51 mL/min — ABNORMAL LOW (ref >60)
GFR calc non Af Amer: 44 mL/min — ABNORMAL LOW (ref >60)
GLUCOSE: 94 mg/dL (ref 70–99)
POTASSIUM: 4.2 mmol/L (ref 3.5–5.1)
SODIUM: 140 mmol/L (ref 135–145)
TOTAL PROTEIN: 5.9 g/dL — AB (ref 6.5–8.1)

## 2017-10-25 LAB — TSH: TSH: 0.321 u[IU]/mL — ABNORMAL LOW (ref 0.350–4.500)

## 2017-10-25 LAB — TROPONIN I
Troponin I: 0.07 ng/mL (ref <0.03)
Troponin I: 0.05 ng/mL (ref <0.03)

## 2017-10-25 LAB — T4, FREE: Free T4: 1.63 ng/dL (ref 0.82–1.77)

## 2017-10-25 MED ORDER — METOPROLOL SUCCINATE ER 50 MG PO TB24
75.0000 mg | ORAL_TABLET | Freq: Every evening | ORAL | Status: DC
  Administered 2017-10-25 – 2017-10-28 (×4): 75 mg via ORAL
  Filled 2017-10-25 (×4): qty 1

## 2017-10-25 MED ORDER — MAGNESIUM SULFATE 2 GM/50ML IV SOLN
2.0000 g | Freq: Once | INTRAVENOUS | Status: AC
  Administered 2017-10-25: 2 g via INTRAVENOUS
  Filled 2017-10-25: qty 50

## 2017-10-25 MED ORDER — TRAMADOL HCL 50 MG PO TABS
50.0000 mg | ORAL_TABLET | Freq: Four times a day (QID) | ORAL | Status: DC | PRN
  Administered 2017-10-26: 50 mg via ORAL
  Administered 2017-10-26: 100 mg via ORAL
  Filled 2017-10-25 (×3): qty 1

## 2017-10-25 MED ORDER — METOPROLOL TARTRATE 5 MG/5ML IV SOLN
5.0000 mg | Freq: Once | INTRAVENOUS | Status: AC
  Administered 2017-10-25: 5 mg via INTRAVENOUS
  Filled 2017-10-25: qty 5

## 2017-10-25 MED ORDER — LOPERAMIDE HCL 2 MG PO CAPS: 2.0000 mg | ORAL_CAPSULE | Freq: Four times a day (QID) | ORAL | Status: DC | PRN

## 2017-10-25 MED ORDER — VERAPAMIL HCL 2.5 MG/ML IV SOLN
2.5000 mg | Freq: Once | INTRAVENOUS | Status: AC
  Administered 2017-10-25: 2.5 mg via INTRAVENOUS
  Filled 2017-10-25: qty 2
  Filled 2017-10-25: qty 1

## 2017-10-25 MED ORDER — METOPROLOL TARTRATE 5 MG/5ML IV SOLN
2.5000 mg | INTRAVENOUS | Status: DC | PRN
  Administered 2017-10-25: 2.5 mg via INTRAVENOUS
  Filled 2017-10-25: qty 5

## 2017-10-25 NOTE — Progress Notes (Signed)
Per MT, patient converted to A-fib, HR 150s with ambulation to bathroom and back to bed. Patient SOB and c/o back pain. Denies chest pain. A few minutes after patient back in bed, patient back to NSR with HR 90s-100s per tele monitor. Olevia Bowens, MD notified. Will continue to monitor.

## 2017-10-25 NOTE — Progress Notes (Signed)
Brenda Bowens, MD responded at this time with new orders. Per tele monitor, patient back to A-fib with HR 120s. Brenda Bowens, MD notified of this as well. New orders received. BSC placed beside patient's bed to ease toileting. Will continue to monitor.

## 2017-10-25 NOTE — Progress Notes (Signed)
PROGRESS NOTE    Evelene H Mooney-Riggs  ZOX:[id number]  DOB: 08/03/1930  DOA: 10/24/2017 PCP: [name], MD   Brief Admission Hx: Miniya H Lindo is a 84 y.o. female with past medical history of CAD (s/p NSTEMI in 2007 with BMS to LCx), PAF (on Xarelto), HTN, HLD and Stage 3 CKD who was admitted for the evaluation of atrial fibrillation with RVR.  Pt has also been nauseated and having difficulty keeping food and meds down since discharge from SNF.   MDM/Assessment & Plan:   1. Recurrent atrial fibrillation with RVR - Pt spontaneous converted to SR and then reverted a couple of times after being given IV metoprolol doses. She is currently rate controlled and on the medical floor.  Increasing her metoprolol back to home dose of 75 mg daily.  Monitor. TSH is slightly suppressed at 0.321.  Will add a free T4 test.  Difficult to interpret this in setting of acute illness and hospitalization, would likely need to follow up outpatient with endocrinology.  2. Nausea and vomiting - Has occurred after recent kyphoplasty.  No current diarrhea but would check C diff with a loose stool.  Continue antiemetics as they are working.  Clears diet advance as tolerated to full liquid diet.   3. CKD stage 3 - stable, following.  4. IBS - stable, follow.  5. Essential hypertension - resumed home medications.  6. Chronic LBP - s/p recent kyphoplasty, will ask PT to help get patient up and moving.   DVT prophylaxis: Xarelto  Code Status: Full  Family Communication: none present Disposition Plan: pending clinical improvement and work up.    Subjective: Pt says she tolerated taking her pills overnight and she would like to try the clears diet.  No emesis since arrival.    Objective: Vitals:   10/24/17 1830 10/24/17 2019 10/25/17 0320 10/25/17 0614  BP: 137/71 138/65 (!) 141/71 133/67  Pulse: 72 73  66  Resp: 19 18  18   Temp:  98.4 F (36.9 C)  98.1 F (36.7 C)  TempSrc:  Oral  Oral  SpO2: 98%  99% 96% 95%  Weight:  67.3 kg (148 lb 5.9 oz)    Height:  5\' 5"  (1.651 m)      Intake/Output Summary (Last 24 hours) at 10/25/2017 1048 Last data filed at 10/25/2017 0836 Gross per 24 hour  Intake 656 ml  Output -  Net 656 ml   Filed Weights   10/24/17 1223 10/24/17 2019  Weight: 68 kg (150 lb) 67.3 kg (148 lb 5.9 oz)     REVIEW OF SYSTEMS  As per history otherwise all reviewed and reported negative  Exam:  General exam: awake, alert, NAD, cooperative.  Respiratory system: Clear. No increased work of breathing. Cardiovascular system: S1 & S2 heard, irregular in 70s. No JVD, murmurs, gallops, clicks or pedal edema. Gastrointestinal system: Abdomen is nondistended, soft and nontender. Normal bowel sounds heard. Central nervous system: Alert and oriented. No focal neurological deficits. Extremities: no CCE.  Data Reviewed: Basic Metabolic Panel: Recent Labs  Lab 10/24/17 1359 10/24/17 1404 10/25/17 0222 10/25/17 0702  NA 140 140  --  140  K 4.2 4.1  --  4.2  CL 103 102  --  108  CO2 24  --   --  22  GLUCOSE 106* 98  --  94  BUN 18 17  --  18  CREATININE 1.37* 1.20*  --  1.12*  CALCIUM 9.2  --   --  8.6*  MG  --   --  1.6* 2.5*   Liver Function Tests: Recent Labs  Lab 10/24/17 1359 10/25/17 0702  AST 26 16  ALT 16 12  ALKPHOS 77 66  BILITOT 0.9 1.1  PROT 6.9 5.9*  ALBUMIN 3.2* 2.7*   Recent Labs  Lab 10/24/17 1359  LIPASE 34   No results for input(s): AMMONIA in the last 168 hours. CBC: Recent Labs  Lab 10/24/17 1359 10/24/17 1404 10/25/17 0702  WBC 11.2*  --  8.0  NEUTROABS  --   --  5.3  HGB 13.2 14.6 11.6*  HCT 42.0 43.0 38.5  MCV 92.7  --  93.2  PLT 576*  --  499*   Cardiac Enzymes: Recent Labs  Lab 10/24/17 2010 10/25/17 0222 10/25/17 0702  TROPONINI 0.07* 0.07* 0.05*   CBG (last 3)  No results for input(s): GLUCAP in the last 72 hours. No results found for this or any previous visit (from the past 240 hour(s)).   Studies: Dg  Chest Portable 1 View  Result Date: 10/24/2017 CLINICAL DATA:  Tachycardia. EXAM: PORTABLE CHEST 1 VIEW COMPARISON:  Radiograph of [place] 10, 2019. FINDINGS: Stable cardiomegaly with central pulmonary vascular congestion is noted. No pneumothorax or significant pleural effusion is noted. No consolidative process is noted. Bony thorax is unremarkable. IMPRESSION: Stable cardiomegaly with central pulmonary vascular congestion. Electronically Signed   By: Marijo Conception, M.D.   On: 10/24/2017 14:57     Scheduled Meds: . aspirin  81 mg Oral Daily  . atorvastatin  80 mg Oral QHS  . estrogens (conjugated)  0.3 mg Oral QPM  . lidocaine  1 patch Transdermal Q24H  . metoprolol succinate  75 mg Oral QPM  . pantoprazole  40 mg Oral q morning - 10a  . Rivaroxaban  15 mg Oral Q supper  . sodium chloride flush  3 mL Intravenous Q12H   Continuous Infusions:  Principal Problem:   Atrial fibrillation with RVR (HCC) Active Problems:   Essential hypertension   History of IBS   CKD (chronic kidney disease), stage III (HCC)   Nausea   Nausea   Time spent:   Irwin Brakeman, MD, FAAFP [facility] Pager [phone]  If 7PM-7AM, please contact night-coverage [email or web address] Password [email or web address] 10/25/2017, 10:48 AM    LOS: 0 days

## 2017-10-25 NOTE — Progress Notes (Signed)
Night shift telemetry coverage note.  The patient had transient atrial fibrillation with RVR with a heart rate in the 150s while ambulating to the bathroom and back to bed.  The patient felt dyspneic, but denied any chest pain.  She subsequently converted to NSR in the 90s-100s, once dyspnea resolved.  However, the patient's heart rate is again in the 110s-120s in A. fib rhythm, while in bed..  Earlier she got metoprolol 5 mg IVP and also metoprolol succinate 50 mg p.o. x1.  I will give another 5 mg of metoprolol IVP x1 and magnesium sulfate 2 g IVPB.  Tennis Must, MD.

## 2017-10-25 NOTE — Progress Notes (Signed)
Patient noted to be in A. Fib, heart rate in the 130- 140 range, BP taken and noted to be 135/79, patient denies any discomfort, chest pain or SOB, patient did not know she was back in A. Fib.  Paged Dr. Wynetta Emery to inform that patient is back in A. Fib in the 130s and metoprolol 2.5 mg IV PRN dose given, will continue to monitor patient.

## 2017-10-25 NOTE — Progress Notes (Signed)
This RN noticed RVR with HR 175 was documented by MT at 2228. This RN was treating another patient in distress at the time, and was not notified of this. With rounding at approximately 2300, patient was sleeping, no distress noted. At 0100 rounding, patient awake with no complaints or needs, no distress noted. Per tele monitor at approximately 0000 and now, patient noted to be in NSR with HR 70s-80s. Will continue to monitor.

## 2017-10-26 ENCOUNTER — Encounter (HOSPITAL_COMMUNITY): Payer: Self-pay | Admitting: Radiology

## 2017-10-26 ENCOUNTER — Inpatient Hospital Stay (HOSPITAL_COMMUNITY): Payer: Medicare Other

## 2017-10-26 LAB — COMPREHENSIVE METABOLIC PANEL
ALBUMIN: 2.8 g/dL — AB (ref 3.5–5.0)
ALK PHOS: 68 U/L (ref 38–126)
ALT: 10 U/L (ref 0–44)
ANION GAP: 8 (ref 5–15)
AST: 16 U/L (ref 15–41)
BILIRUBIN TOTAL: 1.1 mg/dL (ref 0.3–1.2)
BUN: 18 mg/dL (ref 8–23)
CALCIUM: 8.8 mg/dL — AB (ref 8.9–10.3)
CO2: 28 mmol/L (ref 22–32)
Chloride: 103 mmol/L (ref 98–111)
Creatinine, Ser: 1.22 mg/dL — ABNORMAL HIGH (ref 0.44–1.00)
GFR calc Af Amer: 46 mL/min — ABNORMAL LOW (ref >60)
GFR, EST NON AFRICAN AMERICAN: 40 mL/min — AB (ref >60)
Glucose, Bld: 100 mg/dL — ABNORMAL HIGH (ref 70–99)
Potassium: 4 mmol/L (ref 3.5–5.1)
Sodium: 139 mmol/L (ref 135–145)
TOTAL PROTEIN: 6.4 g/dL — AB (ref 6.5–8.1)

## 2017-10-26 LAB — MAGNESIUM: MAGNESIUM: 1.9 mg/dL (ref 1.7–2.4)

## 2017-10-26 MED ORDER — ALPRAZOLAM 0.25 MG PO TABS: 0.2500 mg | ORAL_TABLET | Freq: Three times a day (TID) | ORAL | Status: DC | PRN

## 2017-10-26 MED ORDER — HYDROCODONE-ACETAMINOPHEN 5-325 MG PO TABS
1.0000 | ORAL_TABLET | ORAL | Status: DC | PRN
  Administered 2017-10-26 – 2017-10-27 (×3): 1 via ORAL
  Filled 2017-10-26 (×3): qty 1

## 2017-10-26 NOTE — Progress Notes (Signed)
PROGRESS NOTE    Brenda Hall  RSW:[id number]  DOB: November 30, 1930  DOA: 10/24/2017 PCP: [name], MD   Brief Admission Hx: Brenda Hall is a 84 y.o. female with past medical history of CAD (s/p NSTEMI in 2007 with BMS to LCx), PAF (on Xarelto), HTN, HLD and Stage 3 CKD who was admitted for the evaluation of atrial fibrillation with RVR.  Pt has also been nauseated and having difficulty keeping food and meds down since discharge from SNF.   MDM/Assessment & Plan:   1. Recurrent atrial fibrillation with RVR - Pt spontaneous converted to SR and then reverted a couple of times after being given IV metoprolol doses. She is currently rate controlled and on the medical floor.  Increasing her metoprolol back to home dose of 75 mg daily.  Monitor. TSH is slightly suppressed at 0.321.  Free T4 was normal.  Difficult to interpret this in setting of acute illness and hospitalization, would have patient have these tests repeated outpatient with PCP when feeling better and more medically stable.  2. Nausea and vomiting - Improving.  I wonder if she picked up a virus at rehab facility she recently left.  She had been there for 2 weeks.  No current diarrhea but would check C diff with a loose stool.  Continue antiemetics as they are working.  Advance to soft mechanical diet today.   3. CKD stage 3 - stable, following.  4. IBS - stable, follow.  5. Essential hypertension - resumed home medications.  6. Chronic LBP - s/p recent kyphoplasty, will ask PT to help get patient up and moving.  7. Polymyalgia rheumatica with acute muscle spasm of neck - Moist Heat applied and robaxin ordered as needed for muscle spasms.    DVT prophylaxis: Xarelto  Code Status: Full  Family Communication: none present Disposition Plan: possible discharge home in next 1-2 days if continues to improve   Subjective: Pt feeling better, she is keeping her full liquid diet and medications down.  She is having a  painful neck muscle spasm this morning.   Objective: Vitals:   10/25/17 1313 10/25/17 1613 10/25/17 2138 10/26/17 0656  BP: (!) 121/54 135/79 132/66 131/60  Pulse: 69 (!) 130 78 (!) 58  Resp: 18  18 17   Temp: 98.1 F (36.7 C)  98.7 F (37.1 C) 97.9 F (36.6 C)  TempSrc: Oral  Oral Oral  SpO2: 94%  95% 96%  Weight:      Height:        Intake/Output Summary (Last 24 hours) at 10/26/2017 1047 Last data filed at 10/26/2017 0300 Gross per 24 hour  Intake 960 ml  Output -  Net 960 ml   Filed Weights   10/24/17 1223 10/24/17 2019  Weight: 68 kg (150 lb) 67.3 kg (148 lb 5.9 oz)   REVIEW OF SYSTEMS  As per history otherwise all reviewed and reported negative  Exam:  General exam: awake, alert, NAD, cooperative. Painful tense muscles back of neck.  Respiratory system: Clear. No increased work of breathing. Cardiovascular system: S1 & S2 heard, irregular in 70s. No JVD, murmurs, gallops, clicks or pedal edema. Gastrointestinal system: Abdomen is nondistended, soft and nontender. Normal bowel sounds heard. Central nervous system: Alert and oriented. No focal neurological deficits. Extremities: no CCE.  Data Reviewed: Basic Metabolic Panel: Recent Labs  Lab 10/24/17 1359 10/24/17 1404 10/25/17 0222 10/25/17 0702 10/26/17 0647  NA 140 140  --  140 139  K 4.2 4.1  --  4.2 4.0  CL 103 102  --  108 103  CO2 24  --   --  22 28  GLUCOSE 106* 98  --  94 100*  BUN 18 17  --  18 18  CREATININE 1.37* 1.20*  --  1.12* 1.22*  CALCIUM 9.2  --   --  8.6* 8.8*  MG  --   --  1.6* 2.5* 1.9   Liver Function Tests: Recent Labs  Lab 10/24/17 1359 10/25/17 0702 10/26/17 0647  AST 26 16 16   ALT 16 12 10   ALKPHOS 77 66 68  BILITOT 0.9 1.1 1.1  PROT 6.9 5.9* 6.4*  ALBUMIN 3.2* 2.7* 2.8*   Recent Labs  Lab 10/24/17 1359  LIPASE 34   No results for input(s): AMMONIA in the last 168 hours. CBC: Recent Labs  Lab 10/24/17 1359 10/24/17 1404 10/25/17 0702  WBC 11.2*  --  8.0    NEUTROABS  --   --  5.3  HGB 13.2 14.6 11.6*  HCT 42.0 43.0 38.5  MCV 92.7  --  93.2  PLT 576*  --  499*   Cardiac Enzymes: Recent Labs  Lab 10/24/17 2010 10/25/17 0222 10/25/17 0702  TROPONINI 0.07* 0.07* 0.05*   CBG (last 3)  No results for input(s): GLUCAP in the last 72 hours. No results found for this or any previous visit (from the past 240 hour(s)).   Studies: Dg Chest Portable 1 View  Result Date: 10/24/2017 CLINICAL DATA:  Tachycardia. EXAM: PORTABLE CHEST 1 VIEW COMPARISON:  Radiograph of [place] 10, 2019. FINDINGS: Stable cardiomegaly with central pulmonary vascular congestion is noted. No pneumothorax or significant pleural effusion is noted. No consolidative process is noted. Bony thorax is unremarkable. IMPRESSION: Stable cardiomegaly with central pulmonary vascular congestion. Electronically Signed   By: Marijo Conception, M.D.   On: 10/24/2017 14:57   Scheduled Meds: . aspirin  81 mg Oral Daily  . atorvastatin  80 mg Oral QHS  . estrogens (conjugated)  0.3 mg Oral QPM  . lidocaine  1 patch Transdermal Q24H  . metoprolol succinate  75 mg Oral QPM  . pantoprazole  40 mg Oral q morning - 10a  . Rivaroxaban  15 mg Oral Q supper  . sodium chloride flush  3 mL Intravenous Q12H   Continuous Infusions:  Principal Problem:   Atrial fibrillation with RVR (HCC) Active Problems:   Essential hypertension   History of IBS   CKD (chronic kidney disease), stage III (HCC)   Nausea   Nausea  Time spent:   Irwin Brakeman, MD, FAAFP [facility] Pager [phone]  If 7PM-7AM, please contact night-coverage [email or web address] Password [email or web address] 10/26/2017, 10:47 AM    LOS: 1 day

## 2017-10-27 ENCOUNTER — Inpatient Hospital Stay (HOSPITAL_COMMUNITY): Payer: Medicare Other

## 2017-10-27 MED ORDER — METHOCARBAMOL 500 MG PO TABS
1000.0000 mg | ORAL_TABLET | Freq: Three times a day (TID) | ORAL | Status: DC
  Administered 2017-10-27 – 2017-10-28 (×4): 1000 mg via ORAL
  Filled 2017-10-27 (×5): qty 2

## 2017-10-27 MED ORDER — METHOCARBAMOL 500 MG PO TABS: 1000.0000 mg | ORAL_TABLET | Freq: Four times a day (QID) | ORAL | Status: DC

## 2017-10-27 NOTE — Progress Notes (Signed)
SLP Cancellation Note  Patient Details Name: Brenda Hall MRN: [id number] DOB: 09-18-1930   Cancelled treatment:       Reason Eval/Treat Not Completed: Patient at procedure or test/unavailable; Pt not in room and is off floor for testing so BSE not completed at this time. SLP spoke with Izora Gala, RN who reports that Pt is tolerating diet without incident. SLP will check back later today as schedule permits.   Thank you,  Genene Churn, [place]    [place] 10/27/2017, 1:20 PM

## 2017-10-27 NOTE — Progress Notes (Signed)
PROGRESS NOTE  Brenda Hall  BUL:[id number]  DOB: 1930-08-08  DOA: 10/24/2017 PCP: [name], MD  Brief Admission Hx: Brenda Hall is a 84 y.o. female with past medical history of CAD (s/p NSTEMI in 2007 with BMS to LCx), PAF (on Xarelto), HTN, HLD and Stage 3 CKD who was admitted for the evaluation of atrial fibrillation with RVR.  Pt has also been nauseated and having difficulty keeping food and meds down since discharge from SNF.   MDM/Assessment & Plan:   1. Recurrent atrial fibrillation with RVR - Pt spontaneous converted to SR and then reverted a couple of times after being given IV metoprolol doses. She is currently rate controlled and on the medical floor.  Increasing her metoprolol back to home dose of 75 mg daily.  Monitor. TSH is slightly suppressed at 0.321.  Free T4 was normal.  Difficult to interpret this in setting of acute illness and hospitalization, would have patient have these tests repeated outpatient with PCP when feeling better and more medically stable.  2. Nausea and vomiting - Improving.  I wonder if she picked up a virus at rehab facility she recently left.  She had been there for 2 weeks.  Pt not having diarrhea.  Continue antiemetics as they are working.  Continue soft mechanical diet, advance as tolerated to heart healthy.    3. Neck Pain - Pt developed severe muscle spasm in neck with severe pain. Xrays and CT neck did not show fracture. She does have severe spondylosis and arthritis and arterial calcifications.  Ordered carotid dopplers as suggested by radiologist.  Continue pain medication, heat/cold packs and muscle relaxants.  PT consult requested.  4. CKD stage 3 - stable, following.  5. IBS - stable, follow.  6. Essential hypertension - resumed home medications.  7. Chronic LBP - s/p recent kyphoplasty, will ask PT to help get patient up and moving.  8. Polymyalgia rheumatica with acute muscle spasm of neck - Moist Heat applied and robaxin  ordered for muscle spasms.    DVT prophylaxis: Xarelto  Code Status: Full  Family Communication: none present Disposition Plan: possible discharge home in next 1-2 days if continues to improve   Subjective: Pt continues to have severe neck spasm and pain, unable to turn neck without severe pain   Objective: Vitals:   10/26/17 0656 10/26/17 1322 10/26/17 2209 10/27/17 0537  BP: 131/60 (!) 151/55 127/65 140/74  Pulse: (!) 58 69 68 63  Resp: 17 18 20 17   Temp: 97.9 F (36.6 C) 97.9 F (36.6 C) 98.1 F (36.7 C) (!) 97.4 F (36.3 C)  TempSrc: Oral Oral Oral Oral  SpO2: 96% 94% 94% 93%  Weight:      Height:        Intake/Output Summary (Last 24 hours) at 10/27/2017 0913 Last data filed at 10/27/2017 0551 Gross per 24 hour  Intake 723 ml  Output 200 ml  Net 523 ml   Filed Weights   10/24/17 1223 10/24/17 2019  Weight: 68 kg (150 lb) 67.3 kg (148 lb 5.9 oz)   REVIEW OF SYSTEMS  As per history otherwise all reviewed and reported negative  Exam:  General exam: awake, alert, NAD, cooperative.  Neck: trapezius muscle spasm back of neck. FROM of neck but painful.   Respiratory system: Clear. No increased work of breathing. Cardiovascular system: S1 & S2 heard, irregular in 70s. No JVD, murmurs, gallops, clicks or pedal edema. Gastrointestinal system: Abdomen is nondistended, soft and nontender.  Normal bowel sounds heard. Central nervous system: Alert and oriented. No focal neurological deficits. Extremities: no CCE.  Data Reviewed: Basic Metabolic Panel: Recent Labs  Lab 10/24/17 1359 10/24/17 1404 10/25/17 0222 10/25/17 0702 10/26/17 0647  NA 140 140  --  140 139  K 4.2 4.1  --  4.2 4.0  CL 103 102  --  108 103  CO2 24  --   --  22 28  GLUCOSE 106* 98  --  94 100*  BUN 18 17  --  18 18  CREATININE 1.37* 1.20*  --  1.12* 1.22*  CALCIUM 9.2  --   --  8.6* 8.8*  MG  --   --  1.6* 2.5* 1.9   Liver Function Tests: Recent Labs  Lab 10/24/17 1359 10/25/17 0702  10/26/17 0647  AST 26 16 16   ALT 16 12 10   ALKPHOS 77 66 68  BILITOT 0.9 1.1 1.1  PROT 6.9 5.9* 6.4*  ALBUMIN 3.2* 2.7* 2.8*   Recent Labs  Lab 10/24/17 1359  LIPASE 34   No results for input(s): AMMONIA in the last 168 hours. CBC: Recent Labs  Lab 10/24/17 1359 10/24/17 1404 10/25/17 0702  WBC 11.2*  --  8.0  NEUTROABS  --   --  5.3  HGB 13.2 14.6 11.6*  HCT 42.0 43.0 38.5  MCV 92.7  --  93.2  PLT 576*  --  499*   Cardiac Enzymes: Recent Labs  Lab 10/24/17 2010 10/25/17 0222 10/25/17 0702  TROPONINI 0.07* 0.07* 0.05*   CBG (last 3)  No results for input(s): GLUCAP in the last 72 hours. No results found for this or any previous visit (from the past 240 hour(s)).   Studies: Dg Cervical Spine With Flex & Extend  Result Date: 10/26/2017 CLINICAL DATA:  Acute neck pain EXAM: CERVICAL SPINE COMPLETE WITH FLEXION AND EXTENSION VIEWS COMPARISON:  CT cervical spine 03/08/2013 FINDINGS: Prevertebral soft tissues normal thickness. Bones diffusely demineralized. Disc space narrowing and endplate spur formation at C3-C4 through C6-C7. Mild retrolisthesis at C4-C5 C5-C6. Minimal anterolisthesis C3-C4. Alignments grossly stable since prior CT. No fracture or bone destruction. Multilevel facet degenerative changes. With flexion, anterolisthesis at C3-C4 increases from 1.7 mm to 3.0 mm. No additional abnormal motion with flexion or extension. Extensive atherosclerotic calcifications at the carotid bifurcations. IMPRESSION: Multilevel degenerative disc and facet disease changes of the cervical spine. Minimal anterolisthesis at C3-C4 and retrolisthesis at C4-C5 and C5-C6. Slightly increased anterolisthesis at C3-C4 with flexion, with no additional abnormal motion identified. Extensive atherosclerotic calcifications at the carotid bifurcations bilaterally; consider follow-up non emergent carotid duplex sonography to further assess. Electronically Signed   By: Lavonia Dana M.D.   On: 10/26/2017  19:57   Ct Soft Tissue Neck Wo Contrast  Result Date: 10/26/2017 CLINICAL DATA:  84 y/o F; neck pain, difficulty swallowing medicine, neck spasms. No known injury. EXAM: CT NECK WITHOUT CONTRAST TECHNIQUE: Multidetector CT imaging of the neck was performed following the standard protocol without intravenous contrast. COMPARISON:  10/26/2017 cervical spine radiographs. 03/08/2013 CT cervical spine. FINDINGS: Pharynx and larynx: Normal. No mass or swelling. Salivary glands: No inflammation, mass, or stone. Thyroid: Normal. Lymph nodes: None enlarged or abnormal density. Vascular: Extensive calcific atherosclerosis of the carotid bifurcations and carotid siphons. Limited intracranial: Negative. Visualized orbits: Negative. Mastoids and visualized paranasal sinuses: Clear. Skeleton: Advanced cervical spondylosis with multilevel disc and facet degenerative changes greatest at the C4-C7 levels. Right-sided uncovertebral and facet hypertrophy with bony foraminal encroachment at the  C4-C6 levels and on the left at C3-C7. No high-grade bony canal stenosis. Grade 1 C3-4 anterolisthesis. Upper chest: Biapical pleuroparenchymal scarring. Other: None. IMPRESSION: 1. Advanced cervical spondylosis with multilevel disc and facet degenerative changes greatest at the C4-C7 levels. 2. Calcific atherosclerosis of carotid bifurcations and carotid siphons. Electronically Signed   By: Kristine Garbe M.D.   On: 10/26/2017 20:36   Scheduled Meds: . aspirin  81 mg Oral Daily  . atorvastatin  80 mg Oral QHS  . estrogens (conjugated)  0.3 mg Oral QPM  . lidocaine  1 patch Transdermal Q24H  . metoprolol succinate  75 mg Oral QPM  . pantoprazole  40 mg Oral q morning - 10a  . Rivaroxaban  15 mg Oral Q supper  . sodium chloride flush  3 mL Intravenous Q12H   Continuous Infusions:  Principal Problem:   Atrial fibrillation with RVR (HCC) Active Problems:   Essential hypertension   History of IBS   CKD (chronic kidney  disease), stage III (HCC)   Nausea   Nausea  Time spent:   Irwin Brakeman, MD, FAAFP [facility] Pager [phone]  If 7PM-7AM, please contact night-coverage [email or web address] Password [email or web address] 10/27/2017, 9:13 AM    LOS: 2 days

## 2017-10-27 NOTE — Plan of Care (Signed)
  Problem: Acute Rehab PT Goals(only PT should resolve) Goal: Pt Will Go Supine/Side To Sit Outcome: Progressing Flowsheets (Taken 10/27/2017 1421) Pt will go Supine/Side to Sit: with modified independence Goal: Patient Will Transfer Sit To/From Stand Outcome: Progressing Flowsheets (Taken 10/27/2017 1421) Patient will transfer sit to/from stand: with modified independence Goal: Pt Will Transfer Bed To Chair/Chair To Bed Outcome: Progressing Flowsheets (Taken 10/27/2017 1421) Pt will Transfer Bed to Chair/Chair to Bed: with modified independence Goal: Pt Will Ambulate Outcome: Progressing Flowsheets (Taken 10/27/2017 1421) Pt will Ambulate: 50 feet;with supervision;with rolling walker   2:22 PM, 10/27/17 Lonell Grandchild, MPT Physical Therapist with [facility] [phone] office [phone] mobile phone

## 2017-10-27 NOTE — Evaluation (Signed)
Physical Therapy Evaluation Patient Details Name: Brenda Hall MRN: [id number] DOB: 1930/06/17 Today's Date: 10/27/2017   History of Present Illness  Brenda Hall is a 84 y.o. female with a history of CAD with NSTEMI, basal cell carcinoma status post excision, chronic kidney disease stage III, history of TIA, hypertension, IBS, osteoporosis, polymyalgia rheumatica.  Patient has had several surgeries over the past few weeks and presents with nausea and vomiting since Tuesday.  Patient has found it difficult to keep food down and has been having difficulties taking her pills over the past several days.  Her symptoms are worsening.  No palliating or provoking factors.  Denies shortness of breath, palpitations.    Clinical Impression  Patient functioning near baseline for functional mobility and gait, demonstrates slow labored movement and unable to rotate head side to side due to severe bilateral neck pain, able to ambulate in hallway using her rollator without loss of balance, limited secondary to fatigue and had take sitting rest break on rollator before walking back to room and tolerated sitting up in chair.  Patient will benefit from continued physical therapy in hospital and recommended venue below to increase strength, balance, endurance for safe ADLs and gait.     Follow Up Recommendations Home health PT;Supervision - Intermittent    Equipment Recommendations  None recommended by PT    Recommendations for Other Services       Precautions / Restrictions Precautions Precautions: Fall Restrictions Weight Bearing Restrictions: No      Mobility  Bed Mobility Overal bed mobility: Needs Assistance Bed Mobility: Supine to Sit     Supine to sit: Supervision     General bed mobility comments: with head of bed raised, unable to lie flat due to back pain  Transfers Overall transfer level: Needs assistance Equipment used: 4-wheeled walker;Rolling walker (2  wheeled) Transfers: Sit to/from [facility] Sit to Stand: Supervision Stand pivot transfers: Supervision       General transfer comment: slightly labored movement  Ambulation/Gait Ambulation/Gait assistance: Min guard Gait Distance (Feet): 30 Feet Assistive device: Rolling walker (2 wheeled) Gait Pattern/deviations: Decreased step length - right;Decreased step length - left;Decreased stride length Gait velocity: slow   General Gait Details: demonstrates slow slightly labored cadence, required sitting rest break on Rollator due to fatigue, able to ambulate back to room without loss of balance  Stairs            Wheelchair Mobility    Modified Rankin (Stroke Patients Only)       Balance Overall balance assessment: Needs assistance Sitting-balance support: Feet supported;No upper extremity supported Sitting balance-Leahy Scale: Good     Standing balance support: Bilateral upper extremity supported;During functional activity Standing balance-Leahy Scale: Fair                               Pertinent Vitals/Pain Pain Assessment: 0-10 Pain Score: 2  Pain Location: 2/10 low back, 9/10 bilateral sides posterior neck Pain Descriptors / Indicators: Aching;Discomfort;Other (Comment)(unable to rotate head side to side due to pain/stiffness) Pain Intervention(s): Limited activity within patient's tolerance;Premedicated before session;Monitored during session    [place] expects to be discharged to:: Private residence Living Arrangements: Other relatives(grandson) Available Help at Discharge: Family Type of Home: House Home Access: Stairs to enter Entrance Stairs-Rails: Right;Left;Can reach both Entrance Stairs-Number of Steps: 3 Home Layout: One level Home Equipment: Walker - 4 wheels;Grab bars - tub/shower;Shower seat - built  in;Bedside commode;Hand held shower head Additional Comments: grandson at home at night    Prior  Function Level of Independence: Independent with assistive device(s)         Comments: household ambulator with Rollator     Hand Dominance        Extremity/Trunk Assessment   Upper Extremity Assessment Upper Extremity Assessment: Generalized weakness    Lower Extremity Assessment Lower Extremity Assessment: Generalized weakness    Cervical / Trunk Assessment Cervical / Trunk Assessment: Kyphotic  Communication   Communication: No difficulties  Cognition Arousal/Alertness: Awake/alert Behavior During Therapy: WFL for tasks assessed/performed Overall Cognitive Status: Within Functional Limits for tasks assessed                                        General Comments      Exercises     Assessment/Plan    PT Assessment Patient needs continued PT services  PT Problem List Decreased strength;Decreased activity tolerance;Decreased balance;Decreased mobility;Decreased coordination       PT Treatment Interventions Gait training;Stair training;Functional mobility training;Therapeutic activities;Therapeutic exercise;Patient/family education    PT Goals (Current goals can be found in the Care Plan section)  Acute Rehab PT Goals Patient Stated Goal: return home with family to assist PT Goal Formulation: With patient Time For Goal Achievement: 11/01/17 Potential to Achieve Goals: Good    Frequency Min 3X/week   Barriers to discharge        Co-evaluation               AM-PAC PT "6 Clicks" Daily Activity  Outcome Measure Difficulty turning over in bed (including adjusting bedclothes, sheets and blankets)?: None Difficulty moving from lying on back to sitting on the side of the bed? : A Little Difficulty sitting down on and standing up from a chair with arms (e.g., wheelchair, bedside commode, etc,.)?: None Help needed moving to and from a bed to chair (including a wheelchair)?: A Little Help needed walking in hospital room?: A Little Help  needed climbing 3-5 steps with a railing? : A Little 6 Click Score: 20    End of Session   Activity Tolerance: Patient tolerated treatment well;Patient limited by fatigue Patient left: in chair;with call bell/phone within reach Nurse Communication: Mobility status PT Visit Diagnosis: Unsteadiness on feet (R26.81);Other abnormalities of gait and mobility (R26.89);Muscle weakness (generalized) (M62.81)    Time: 4034-7425 PT Time Calculation (min) (ACUTE ONLY): 31 min   Charges:   PT Evaluation $PT Eval Moderate Complexity: 1 Mod PT Treatments $Therapeutic Activity: 23-37 mins   PT G Codes:        2:18 PM, 11-21-2017 Lonell Grandchild, MPT Physical Therapist with [facility] [phone] office [phone] mobile phone

## 2017-10-27 NOTE — Progress Notes (Addendum)
Walked to bathroom with her personal rolling walker and stated much easier to walk with that than regular hospital walker.  Stated pain in neck has improved to a 3 in back and 5 in neck

## 2017-10-27 NOTE — Care Management Note (Signed)
Case Management Note  Patient Details  Name: Ranada ANNALISIA INGBER MRN: [id number] Date of Birth: 1930-08-18  Subjective/Objective:                  Afib. Recent DC from [place]. From home, ind with ADL's. Has Rollator walker.   Action/Plan: Patient is active with [facility] home health. Will need resumption orders.   Expected Discharge Date:       10/28/2017           Expected Discharge Plan:  [place]  In-House Referral:     Discharge planning Services  CM Consult  Post Acute Care Choice:  [place], Resumption of Svcs/PTA Provider Choice offered to:  Patient  DME Arranged:    DME Agency:     HH Arranged:    [place]:  [place]  Status of Service:  Completed, signed off  If discussed at [place] of Stay Meetings, dates discussed:    Additional Comments:  Vanda Waskey, Chaun[place]y [place], RN 10/27/2017, 2:14 PM

## 2017-10-28 DIAGNOSIS — N183 Chronic kidney disease, stage 3 (moderate): Secondary | ICD-10-CM

## 2017-10-28 NOTE — Plan of Care (Signed)
Patient walking x 1 assist with rolling walker and tolerating well

## 2017-10-28 NOTE — Evaluation (Signed)
Clinical/Bedside Swallow Evaluation Patient Details  Name: Brenda Hall MRN: [id number] Date of Birth: [birth year]  Today's Date: 10/28/2017 Time: SLP Start Time (ACUTE ONLY): 1320 SLP Stop Time (ACUTE ONLY): 1336 SLP Time Calculation (min) (ACUTE ONLY): 16 min  Past Medical History:  Past Medical History:  Diagnosis Date  . Anemia   . CAD in native artery    a. NSTEMI 12/2005 s/p BMS to Cx, prior normal LVEF.  Marland Kitchen Cancer ([place]) 1988 and  2009   BCC nose and  Melanoma back  . Cancer of skin Jan. 2014   [name]  . Carotid stenosis    mild  . Chronic back pain   . CKD (chronic kidney disease), stage III ([place])   . Coronary atherosclerosis of unspecified type of vessel, native or graft   . DJD (degenerative joint disease) of knee   . History of TIA (transient ischemic attack)   . HTN (hypertension)   . IBS (irritable bowel syndrome)   . Myocardial infarction [facility])    5 yrs ago     dr wall  cardiac  . Osteoporosis   . Other diseases of lung, not elsewhere classified    lung nodule  . Polymyalgia rheumatica ([place])   . Shingles 01/2016-02/2016  . Stroke [facility])    Past Surgical History:  Past Surgical History:  Procedure Laterality Date  . ABDOMINAL HYSTERECTOMY  1977   partial  . BACK SURGERY  1982  . CARDIAC CATHETERIZATION    . COLONOSCOPY N/A 01/27/2013   Procedure: COLONOSCOPY;  Surgeon: Rogene Houston, MD;  Location: [facility];  Service: Endoscopy;  Laterality: N/A;  1030  . EYE SURGERY Bilateral 04/2017  . IR KYPHO LUMBAR INC FX REDUCE BONE BX UNI/BIL CANNULATION INC/IMAGING  09/30/2017  . IR VERTEBROPLASTY CERV/THOR BX INC UNI/BIL INC/INJECT/IMAGING  09/05/2017  . JOINT REPLACEMENT  2001 and  2011   6 knee surgeries   bil replacement  . KNEE ARTHROSCOPY    . LUMBAR LAMINECTOMY    . PR VEIN BYPASS GRAFT,AORTO-FEM-POP  2010  . removal of melanoma from back  2007  . SPINE SURGERY  2013   Broken back: fusion surgery  . tonisllectomy  1944  . total  knee anthroplasty    . VERTEBROPLASTY  07/11/2011   Procedure: VERTEBROPLASTY;  Surgeon: Faythe Ghee, MD;  Location: [place] [facility];  Service: Neurosurgery;  Laterality: N/A;  Vertebroplasty of Lumbar Two   HPI:  Brenda Hall is a 84 y.o. female with past medical history of CAD (s/p NSTEMI in 2007 with BMS to LCx), PAF (on Xarelto), HTN, HLD and Stage 3 CKD who was admitted for the evaluation of atrial fibrillation with RVR.  Pt has also been nauseated and having difficulty keeping food and meds down since discharge from SNF.    Assessment / Plan / Recommendation Clinical Impression  Clinical swallow evaluation completed at bedside. Oral motor examination reveals WFL strength, ROM, and coordination. Pt indicates that she was nauseaus at [facility] and this continued once she was discharged home. Pt exhibited no overt signs or symptoms of aspiration clinically. Recommend advancing to regular textures and thin liquids with standard aspiration and reflux precautions. No further SLP services indicated at this time. Pt in agreement with plan of care. Pt does report that she has not been eating very much and had difficulty cutting her chicken.  SLP Visit Diagnosis: Dysphagia, unspecified (R13.10)    Aspiration Risk  No limitations  Diet Recommendation Regular;Thin liquid   Liquid Administration via: Straw;Cup Medication Administration: Whole meds with liquid Supervision: Patient able to self feed Postural Changes: Seated upright at 90 degrees;Remain upright for at least 30 minutes after po intake    Other  Recommendations Oral Care Recommendations: Oral care BID Other Recommendations: Clarify dietary restrictions   Follow up Recommendations None      Frequency and Duration            Prognosis Prognosis for Safe Diet Advancement: Good      Swallow Study   General Date of Onset: 10/24/17 HPI: Brenda Hall is a 84 y.o. female with past medical history of CAD (s/p NSTEMI in  2007 with BMS to LCx), PAF (on Xarelto), HTN, HLD and Stage 3 CKD who was admitted for the evaluation of atrial fibrillation with RVR.  Pt has also been nauseated and having difficulty keeping food and meds down since discharge from SNF.  Type of Study: Bedside Swallow Evaluation Previous Swallow Assessment: None on record Diet Prior to this Study: Thin liquids(soft/thin) Temperature Spikes Noted: No Respiratory Status: Room air History of Recent Intubation: No Behavior/Cognition: Alert;Cooperative;Pleasant mood Oral Cavity Assessment: Within Functional Limits Oral Care Completed by SLP: No Oral Cavity - Dentition: Adequate natural dentition Vision: Functional for self-feeding Self-Feeding Abilities: Able to feed self Patient Positioning: Upright in bed Baseline Vocal Quality: Normal Volitional Cough: Strong Volitional Swallow: Able to elicit    Oral/Motor/Sensory Function Overall Oral Motor/Sensory Function: Within functional limits   Ice Chips Ice chips: Within functional limits Presentation: Spoon   Thin Liquid Thin Liquid: Within functional limits Presentation: Cup;Self Fed;Straw    Nectar Thick Nectar Thick Liquid: Not tested   Honey Thick Honey Thick Liquid: Not tested   Puree Puree: Within functional limits   Solid   Thank you,  Genene Churn, CCC-SLP [phone]    Solid: Within functional limits Presentation: Self Fed Other Comments: mildly prolonged oral prep with dry peanut butter crackers        Haig Gerardo 10/28/2017,1:38 PM

## 2017-10-28 NOTE — Progress Notes (Signed)
IV removed and discharge instructions reviewed with follow up made with primary.  No new scripts given and granddaughter to drive home

## 2017-10-28 NOTE — Discharge Summary (Signed)
Physician Discharge Summary  Brenda Hall QIH:[id number] DOB: 08-15-1930 DOA: 10/24/2017  PCP: [name], MD  Admit date: 10/24/2017 Discharge date: 10/28/2017  Admitted From: Home Disposition: Home  Recommendations for Outpatient Follow-up:  1. Follow up with PCP in 1-2 weeks 2. Please obtain BMP/CBC in one week  Home Health: Home health PT, RN Equipment/Devices:  Discharge Condition: Stable CODE STATUS: Full code Diet recommendation: Heart healthy  Brief/Interim Summary: 84 year old female who was recently at a skilled nursing facility, developed nausea and vomiting and was unable to keep down any food or her medications.  She presents to the hospital with rapid atrial fibrillation, likely due to inability to keep her medications down.  She was admitted for further treatment.  Discharge Diagnoses:  Principal Problem:   Atrial fibrillation with RVR (HCC) Active Problems:   Essential hypertension   History of IBS   CKD (chronic kidney disease), stage III (HCC)   Nausea   Nausea  1. Recurrent atrial fibrillation with RVR -patient was treated with intravenous diltiazem.  She spontaneous converted to SR and then reverted a couple of times after being given IV metoprolol doses.  She was restarted on her home dose of oral metoprolol. TSH is slightly suppressed at 0.321.  Free T4 was normal.  Difficult to interpret this in setting of acute illness and hospitalization, would have patient have these tests repeated outpatient with PCP when feeling better and more medically stable.  2. Nausea and vomiting - Improving.  I wonder if she picked up a virus at rehab facility she recently left.  She had been there for 2 weeks.  Pt not having diarrhea.    GI symptoms are now resolved with supportive measures and she is tolerating a diet.   3. Neck Pain - Pt developed severe muscle spasm in neck with severe pain. Xrays and CT neck did not show fracture. She does have severe spondylosis and  arthritis and arterial calcifications.  She was treated supportively with pain medication and muscle relaxants.  Overall symptoms have improved.  Seen by physical therapy recommended home health..  4. CKD stage 3 - stable, following.  5. IBS - stable, follow.  6. Essential hypertension - resumed home medications.  Blood pressure stable 7. Chronic LBP - s/p recent kyphoplasty, seen by physical therapy recommended home health 8. Polymyalgia rheumatica with acute muscle spasm of neck - Moist Heat applied and robaxin ordered for muscle spasms.  Overall symptoms are improved at the time of discharge     Discharge Instructions  Discharge Instructions    Diet - low sodium heart healthy   Complete by:  As directed    Increase activity slowly   Complete by:  As directed      Allergies as of 10/28/2017      Reactions   Epinephrine Other (See Comments)   During dental procedure-caused facial swelling at injection site. Pt has had epinephrine since then for nose and facial surgery and did fine with it per pt.   Clopidogrel Bisulfate Itching, Rash   Plavix   Oxycodone    Hallucinations. Pt states that she can tolerate hydrocodone      Medication List    STOP taking these medications   predniSONE 5 MG tablet Commonly known as:  DELTASONE     TAKE these medications   acetaminophen 500 MG tablet Commonly known as:  TYLENOL Take 500 mg by mouth every 6 (six) hours as needed for mild pain.   amLODipine 5 MG  tablet Commonly known as:  NORVASC Take 5 mg by mouth daily.   aspirin 81 MG tablet Take 81 mg by mouth daily.   atorvastatin 80 MG tablet Commonly known as:  LIPITOR Take 1 tablet (80 mg total) by mouth at bedtime.   CALCIUM 1200 PO Take 1 tablet by mouth at bedtime.   estrogens (conjugated) 0.3 MG tablet Commonly known as:  PREMARIN Take 1 tablet (0.3 mg total) by mouth every evening.   fluticasone 50 MCG/ACT nasal spray Commonly known as:  FLONASE USE TWO SPRAYS IN EACH  NOSTRIL ONCE DAILY What changed:    how much to take  how to take this  when to take this  reasons to take this  additional instructions   furosemide 20 MG tablet Commonly known as:  LASIX Take 20 mg DAILY AS NEEDED FOR SWELLING. What changed:    how much to take  how to take this  when to take this  reasons to take this  additional instructions   gabapentin 100 MG capsule Commonly known as:  NEURONTIN Take 1 capsule (100 mg total) by mouth at bedtime.   lidocaine 5 % Commonly known as:  LIDODERM Place 1 patch onto the skin daily. Remove & Discard patch within 12 hours or as directed by MD   loperamide 2 MG capsule Commonly known as:  IMODIUM Take 2 mg by mouth daily as needed for diarrhea or loose stools.   methocarbamol 500 MG tablet Commonly known as:  ROBAXIN Take 2 tablets (1,000 mg total) by mouth every 8 (eight) hours as needed for muscle spasms.   metoprolol succinate 50 MG 24 hr tablet Commonly known as:  TOPROL-XL Take 1 and 1/2 Tablets At Night What changed:    how much to take  how to take this  when to take this  additional instructions   nitroGLYCERIN 0.4 MG SL tablet Commonly known as:  NITROSTAT Place 1 tablet (0.4 mg total) under the tongue every 5 (five) minutes as needed for chest pain.   pantoprazole 40 MG tablet Commonly known as:  PROTONIX TAKE 1 TABLET EVERY MORNING   senna-docusate 8.6-50 MG tablet Commonly known as:  Senokot-S Take 1 tablet by mouth at bedtime as needed for mild constipation.   traMADol 50 MG tablet Commonly known as:  ULTRAM Take 1-2 tablets (50-100 mg total) by mouth every 6 (six) hours as needed for moderate pain.   XARELTO 15 MG Tabs tablet Generic drug:  Rivaroxaban TAKE 1 TABLET DAILY WITH SUPPER   Zinc 50 MG Caps Take 50 mg by mouth daily.      Follow-up Information    [name], MD On 10/31/2017.   Specialty:  Family Medicine Why:  Appointment Friday July 12th at  1:00pm Contact information: [place] [address] [phone]          Allergies  Allergen Reactions  . Epinephrine Other (See Comments)    During dental procedure-caused facial swelling at injection site. Pt has had epinephrine since then for nose and facial surgery and did fine with it per pt.  . Clopidogrel Bisulfate Itching and Rash    Plavix  . Oxycodone     Hallucinations. Pt states that she can tolerate hydrocodone    Consultations:  Cardiology   Procedures/Studies: Dg Cervical Spine With Flex & Extend  Result Date: 10/26/2017 CLINICAL DATA:  Acute neck pain EXAM: CERVICAL SPINE COMPLETE WITH FLEXION AND EXTENSION VIEWS COMPARISON:  CT cervical spine  03/08/2013 FINDINGS: Prevertebral soft tissues normal thickness. Bones diffusely demineralized. Disc space narrowing and endplate spur formation at C3-C4 through C6-C7. Mild retrolisthesis at C4-C5 C5-C6. Minimal anterolisthesis C3-C4. Alignments grossly stable since prior CT. No fracture or bone destruction. Multilevel facet degenerative changes. With flexion, anterolisthesis at C3-C4 increases from 1.7 mm to 3.0 mm. No additional abnormal motion with flexion or extension. Extensive atherosclerotic calcifications at the carotid bifurcations. IMPRESSION: Multilevel degenerative disc and facet disease changes of the cervical spine. Minimal anterolisthesis at C3-C4 and retrolisthesis at C4-C5 and C5-C6. Slightly increased anterolisthesis at C3-C4 with flexion, with no additional abnormal motion identified. Extensive atherosclerotic calcifications at the carotid bifurcations bilaterally; consider follow-up non emergent carotid duplex sonography to further assess. Electronically Signed   By: Lavonia Dana M.D.   On: 10/26/2017 19:57   Ct Soft Tissue Neck Wo Contrast  Result Date: 10/26/2017 CLINICAL DATA:  84 y/o F; neck pain, difficulty swallowing medicine, neck spasms. No known injury. EXAM: CT NECK WITHOUT  CONTRAST TECHNIQUE: Multidetector CT imaging of the neck was performed following the standard protocol without intravenous contrast. COMPARISON:  10/26/2017 cervical spine radiographs. 03/08/2013 CT cervical spine. FINDINGS: Pharynx and larynx: Normal. No mass or swelling. Salivary glands: No inflammation, mass, or stone. Thyroid: Normal. Lymph nodes: None enlarged or abnormal density. Vascular: Extensive calcific atherosclerosis of the carotid bifurcations and carotid siphons. Limited intracranial: Negative. Visualized orbits: Negative. Mastoids and visualized paranasal sinuses: Clear. Skeleton: Advanced cervical spondylosis with multilevel disc and facet degenerative changes greatest at the C4-C7 levels. Right-sided uncovertebral and facet hypertrophy with bony foraminal encroachment at the C4-C6 levels and on the left at C3-C7. No high-grade bony canal stenosis. Grade 1 C3-4 anterolisthesis. Upper chest: Biapical pleuroparenchymal scarring. Other: None. IMPRESSION: 1. Advanced cervical spondylosis with multilevel disc and facet degenerative changes greatest at the C4-C7 levels. 2. Calcific atherosclerosis of carotid bifurcations and carotid siphons. Electronically Signed   By: Kristine Garbe M.D.   On: 10/26/2017 20:36   US Carotid Bilateral  Result Date: 10/27/2017 CLINICAL DATA:  84 year old female with a history of cardiovascular risk factors: Hypertension, known coronary disease EXAM: BILATERAL CAROTID DUPLEX ULTRASOUND TECHNIQUE: Pearline Cables scale imaging, color Doppler and duplex ultrasound were performed of bilateral carotid and vertebral arteries in the neck. COMPARISON:  None. FINDINGS: Criteria: Quantification of carotid stenosis is based on velocity parameters that correlate the residual internal carotid diameter with NASCET-based stenosis levels, using the diameter of the distal internal carotid lumen as the denominator for stenosis measurement. The following velocity measurements were obtained:  RIGHT ICA:  Systolic 465 cm/sec, Diastolic 21 cm/sec CCA:  77 cm/sec SYSTOLIC ICA/CCA RATIO:  1.5 ECA:  230 cm/sec LEFT ICA:  Systolic 681 cm/sec, Diastolic 39 cm/sec CCA:  97 cm/sec SYSTOLIC ICA/CCA RATIO:  None 1.2 ECA:  75 cm/sec Right Brachial SBP: Not acquired Left Brachial SBP: Not acquired RIGHT CAROTID ARTERY: No significant calcifications of the right common carotid artery. Intermediate waveform maintained. Heterogeneous and partially calcified plaque at the right carotid bifurcation. No significant lumen shadowing. Low resistance waveform of the right ICA. No significant tortuosity. RIGHT VERTEBRAL ARTERY: Antegrade flow with low resistance waveform. LEFT CAROTID ARTERY: No significant calcifications of the left common carotid artery. Intermediate waveform maintained. Heterogeneous and partially calcified plaque at the left carotid bifurcation without significant lumen shadowing. Low resistance waveform of the left ICA. No significant tortuosity. LEFT VERTEBRAL ARTERY:  Antegrade flow with low resistance waveform. IMPRESSION: Color duplex indicates minimal heterogeneous and calcified plaque, with no hemodynamically significant stenosis  by duplex criteria in the extracranial cerebrovascular circulation. Signed, Dulcy Fanny. Dellia Nims, RPVI [facility] Electronically Signed   By: Corrie Mckusick D.O.   On: 10/27/2017 14:51   Dg Chest Portable 1 View  Result Date: 10/24/2017 CLINICAL DATA:  Tachycardia. EXAM: PORTABLE CHEST 1 VIEW COMPARISON:  Radiograph of Melenda 10, 2019. FINDINGS: Stable cardiomegaly with central pulmonary vascular congestion is noted. No pneumothorax or significant pleural effusion is noted. No consolidative process is noted. Bony thorax is unremarkable. IMPRESSION: Stable cardiomegaly with central pulmonary vascular congestion. Electronically Signed   By: Marijo Conception, M.D.   On: 10/24/2017 14:57   Dg Chest Port 1 View  Result  Date: 09/29/2017 CLINICAL DATA:  84 year old female with a history of fever EXAM: PORTABLE CHEST 1 VIEW COMPARISON:  02/13/2017, 02/12/2017 FINDINGS: Cardiomediastinal silhouette unchanged in size and contour. Patchy opacity in the retrocardiac region with partial obscuration left hemidiaphragm. No large pleural effusion. Coarsened interstitial markings bilaterally. No pneumothorax. No acute displaced fracture IMPRESSION: Retrocardiac patchy opacity, may represent developing infection or chronic changes. Electronically Signed   By: Corrie Mckusick D.O.   On: 09/29/2017 15:51   Ir Kypho Lumbar Inc Fx Reduce Bone Bx Uni/bil Cannulation Inc/imaging  Result Date: 10/02/2017 INDICATION: Severe low back pain secondary to compression fracture at L1. EXAM: BALLOON KYPHOPLASTY L1 COMPARISON:  MRI scan of 09/27/2017. MEDICATIONS: As antibiotic prophylaxis, patient was receiving IV Rocephin prior to the procedure and administered intravenously within 1 hour of incision. ANESTHESIA/SEDATION: Moderate (conscious) sedation was employed during this procedure. A total of Versed 2 mg and Fentanyl 75 mcg was administered intravenously. Moderate Sedation Time: 28 minutes. The patient's level of consciousness and vital signs were monitored continuously by radiology nursing throughout the procedure under my direct supervision. FLUOROSCOPY TIME:  Fluoroscopy Time: 10 minutes 30 seconds (536 mGy) COMPLICATIONS: None immediate. PROCEDURE: Following a full explanation of the procedure along with the potential associated complications, an informed witnessed consent was obtained. The patient was placed prone on the fluoroscopic table. The skin overlying the thoracolumbar region was then prepped and draped in the usual sterile fashion. The right pedicle at L1 was then infiltrated with 0.25% bupivacaine followed by the advancement of an 11-gauge Jamshidi needle through the right pedicle into the posterior one-third at L1. This was then  exchanged for a Kyphon advanced osteo introducer system comprised of a working cannula and a Kyphon osteo drill. This combination was then advanced over a Kyphon osteo bone pin until the tip of the Kyphon osteo drill was in the posterior third at L1. At this time, the bone pin was removed. In a medial trajectory, the combination was advanced until the tip of the working cannula was inside the posterior one-third at L1. Through the working cannula, a Kyphon inflatable bone tamp 20 x 3 was advanced and positioned with the distal marker 5 mm from the anterior aspect of L1. Crossing of the midline was seen on the AP projection. At this time, the balloon was expanded using contrast via a Kyphon inflation syringe device via microtubing. Inflations were continued until there was apposition with the superior endplate. At this time, methylmethacrylate mixture was reconstituted with Tobramycin in the Kyphon bone mixing device system. This was then loaded onto the Kyphon bone fillers. The balloon was deflated and removed followed by the instillation of 4 bone filler equivalents of methylmethacrylate mixture at L1 with excellent filling in the AP and lateral projections. No extravasation was noted in  the disk spaces or posteriorly into the spinal canal. No epidural venous contamination was seen. The working cannula and the bone filler were then retrieved and removed. Hemostasis was achieved at the skin entry site. The patient tolerated the procedure well and there were no complications. The patient was then transported back to her room in stable condition. IMPRESSION: 1. Status post vertebral body augmentation using balloon kyphoplasty at L1 as described without event. Electronically Signed   By: Luanne Bras M.D.   On: 09/30/2017 16:05       Subjective: Feeling better today.  No nausea or vomiting.  Neck pain is better today.  Discharge Exam: Vitals:   10/28/17 0548 10/28/17 1432  BP: (!) 154/71 132/69   Pulse: 65 69  Resp: 16 20  Temp: 98.5 F (36.9 C) 97.8 F (36.6 C)  SpO2: 95% 95%   Vitals:   10/27/17 1744 10/27/17 2136 10/28/17 0548 10/28/17 1432  BP: (!) 138/59 135/66 (!) 154/71 132/69  Pulse: 63 63 65 69  Resp:  15 16 20   Temp:  98.4 F (36.9 C) 98.5 F (36.9 C) 97.8 F (36.6 C)  TempSrc:  Oral Oral   SpO2:  95% 95% 95%  Weight:      Height:        General: Pt is alert, awake, not in acute distress Cardiovascular: RRR, S1/S2 +, no rubs, no gallops Respiratory: CTA bilaterally, no wheezing, no rhonchi Abdominal: Soft, NT, ND, bowel sounds + Extremities: no edema, no cyanosis    The results of significant diagnostics from this hospitalization (including imaging, microbiology, ancillary and laboratory) are listed below for reference.     Microbiology: No results found for this or any previous visit (from the past 240 hour(s)).   Labs: BNP (last 3 results) Recent Labs    02/12/17 2014  BNP 378.5*   Basic Metabolic Panel: Recent Labs  Lab 10/24/17 1359 10/24/17 1404 10/25/17 0222 10/25/17 0702 10/26/17 0647  NA 140 140  --  140 139  K 4.2 4.1  --  4.2 4.0  CL 103 102  --  108 103  CO2 24  --   --  22 28  GLUCOSE 106* 98  --  94 100*  BUN 18 17  --  18 18  CREATININE 1.37* 1.20*  --  1.12* 1.22*  CALCIUM 9.2  --   --  8.6* 8.8*  MG  --   --  1.6* 2.5* 1.9   Liver Function Tests: Recent Labs  Lab 10/24/17 1359 10/25/17 0702 10/26/17 0647  AST 26 16 16   ALT 16 12 10   ALKPHOS 77 66 68  BILITOT 0.9 1.1 1.1  PROT 6.9 5.9* 6.4*  ALBUMIN 3.2* 2.7* 2.8*   Recent Labs  Lab 10/24/17 1359  LIPASE 34   No results for input(s): AMMONIA in the last 168 hours. CBC: Recent Labs  Lab 10/24/17 1359 10/24/17 1404 10/25/17 0702  WBC 11.2*  --  8.0  NEUTROABS  --   --  5.3  HGB 13.2 14.6 11.6*  HCT 42.0 43.0 38.5  MCV 92.7  --  93.2  PLT 576*  --  499*   Cardiac Enzymes: Recent Labs  Lab 10/24/17 2010 10/25/17 0222 10/25/17 0702   TROPONINI 0.07* 0.07* 0.05*   BNP: Invalid input(s): POCBNP CBG: No results for input(s): GLUCAP in the last 168 hours. D-Dimer No results for input(s): DDIMER in the last 72 hours. Hgb A1c No results for input(s): HGBA1C in the last 72  hours. Lipid Profile No results for input(s): CHOL, HDL, LDLCALC, TRIG, CHOLHDL, LDLDIRECT in the last 72 hours. Thyroid function studies No results for input(s): TSH, T4TOTAL, T3FREE, THYROIDAB in the last 72 hours.  Invalid input(s): FREET3 Anemia work up No results for input(s): VITAMINB12, FOLATE, FERRITIN, TIBC, IRON, RETICCTPCT in the last 72 hours. Urinalysis    Component Value Date/Time   COLORURINE YELLOW 10/24/2017 1610   APPEARANCEUR CLEAR 10/24/2017 1610   LABSPEC 1.006 10/24/2017 1610   PHURINE 7.0 10/24/2017 1610   GLUCOSEU NEGATIVE 10/24/2017 1610   HGBUR NEGATIVE 10/24/2017 1610   BILIRUBINUR NEGATIVE 10/24/2017 1610   KETONESUR NEGATIVE 10/24/2017 1610   PROTEINUR NEGATIVE 10/24/2017 1610   UROBILINOGEN 0.2 07/28/2008 1156   NITRITE NEGATIVE 10/24/2017 1610   LEUKOCYTESUR NEGATIVE 10/24/2017 1610   Sepsis Labs Invalid input(s): PROCALCITONIN,  WBC,  LACTICIDVEN Microbiology No results found for this or any previous visit (from the past 240 hour(s)).   Time coordinating discharge: 75mins  SIGNED:   Kathie Dike, MD  [facility] 10/28/2017, 8:48 PM Pager   If 7PM-7AM, please contact night-coverage [email or web address] Password [email or web address]

## 2017-10-28 NOTE — Progress Notes (Signed)
Physical Therapy Treatment Patient Details Name: Brenda Hall MRN: [id number] DOB: 03-08-1931 Today's Date: 10/28/2017    History of Present Illness Brenda Hall is a 84 y.o. female with a history of CAD with NSTEMI, basal cell carcinoma status post excision, chronic kidney disease stage III, history of TIA, hypertension, IBS, osteoporosis, polymyalgia rheumatica.  Patient has had several surgeries over the past few weeks and presents with nausea and vomiting since Tuesday.  Patient has found it difficult to keep food down and has been having difficulties taking her pills over the past several days.  Her symptoms are worsening.  No palliating or provoking factors.  Denies shortness of breath, palpitations.    PT Comments    Pt supine in bed and willing to participate.  Pt pre-medicated prior therapist entrance and reports vast improvements with cervical mobility.  Does have some neck and back pain today, 3/10 monitored through session.  Pt independent with bed mobility, just increased time to complete.  Min guard with transfers with verbal cueing for hand placement to assist.  Increased distance with gait training today.  Pt able to ambulate 54 feet then requested seated rest break on her rollator.  Following rest break pt able to ambulate 74 feet back to room.  No LOB or reports of increased pain through session.  Left in chair with call bell within reach.  RN aware of status.  Follow Up Recommendations        Equipment Recommendations       Recommendations for Other Services       Precautions / Restrictions Precautions Precautions: Fall Restrictions Weight Bearing Restrictions: No    Mobility  Bed Mobility Overal bed mobility: Modified Independent Bed Mobility: Supine to Sit     Supine to sit: Supervision     General bed mobility comments: independent bed mobiltiy, increased time  Transfers Overall transfer level: Modified independent Equipment used: 4-wheeled  walker(rollator) Transfers: Sit to/from Stand Sit to Stand: Supervision         General transfer comment: slightly labored movement, cueing for hand placement  Ambulation/Gait Ambulation/Gait assistance: Min guard Gait Distance (Feet): 54 Feet(54 feet prior seated rest breaks on rollator then ambulated 74 ft back to room) Assistive device: Rolling walker (2 wheeled) Gait Pattern/deviations: Decreased step length - right;Decreased step length - left;Decreased stride length Gait velocity: slow   General Gait Details: demonstrates slow slightly labored cadence, forward bent posture, required sitting rest break on Rollator due to fatigue, able to ambulate back to room without loss of balance   Stairs             Wheelchair Mobility    Modified Rankin (Stroke Patients Only)       Balance                                            Cognition Arousal/Alertness: Awake/alert Behavior During Therapy: WFL for tasks assessed/performed Overall Cognitive Status: Within Functional Limits for tasks assessed                                        Exercises General Exercises - Lower Extremity Ankle Circles/Pumps: AROM;15 reps;Seated Long Arc Quad: AROM;Both;10 reps Other Exercises Other Exercises: scapular retraction 10x 3"    General Comments  Pertinent Vitals/Pain Pain Assessment: 0-10 Pain Score: 3  Pain Location: neck and back Pain Descriptors / Indicators: Tightness;Aching;Discomfort(Reports of improved cervical mobility) Pain Intervention(s): Monitored during session;Limited activity within patient's tolerance;Premedicated before session    Home Living                      Prior Function            PT Goals (current goals can now be found in the care plan section)      Frequency    Min 3X/week      PT Plan Current plan remains appropriate    Co-evaluation              AM-PAC PT "6 Clicks"  Daily Activity  Outcome Measure  Difficulty turning over in bed (including adjusting bedclothes, sheets and blankets)?: None Difficulty moving from lying on back to sitting on the side of the bed? : A Little Difficulty sitting down on and standing up from a chair with arms (e.g., wheelchair, bedside commode, etc,.)?: None Help needed moving to and from a bed to chair (including a wheelchair)?: A Little Help needed walking in hospital room?: A Little Help needed climbing 3-5 steps with a railing? : A Little 6 Click Score: 20    End of Session Equipment Utilized During Treatment: Gait belt Activity Tolerance: Patient tolerated treatment well;Patient limited by fatigue Patient left: in chair;with call bell/phone within reach Nurse Communication: Mobility status(RN aware of status) PT Visit Diagnosis: Unsteadiness on feet (R26.81);Other abnormalities of gait and mobility (R26.89);Muscle weakness (generalized) (M62.81)     Time: 9509-3267 PT Time Calculation (min) (ACUTE ONLY): 25 min  Charges:  $Therapeutic Activity: 23-37 mins                    G Codes:       Ihor Austin, LPTA; CBIS [phone]   Aldona Lento 10/28/2017, 12:05 PM

## 2017-10-30 DIAGNOSIS — M545 Low back pain: Secondary | ICD-10-CM | POA: Diagnosis not present

## 2017-10-30 DIAGNOSIS — I2510 Atherosclerotic heart disease of native coronary artery without angina pectoris: Secondary | ICD-10-CM | POA: Diagnosis not present

## 2017-10-30 DIAGNOSIS — I129 Hypertensive chronic kidney disease with stage 1 through stage 4 chronic kidney disease, or unspecified chronic kidney disease: Secondary | ICD-10-CM | POA: Diagnosis not present

## 2017-10-30 DIAGNOSIS — J449 Chronic obstructive pulmonary disease, unspecified: Secondary | ICD-10-CM | POA: Diagnosis not present

## 2017-10-30 DIAGNOSIS — N183 Chronic kidney disease, stage 3 (moderate): Secondary | ICD-10-CM | POA: Diagnosis not present

## 2017-10-30 DIAGNOSIS — S32019D Unspecified fracture of first lumbar vertebra, subsequent encounter for fracture with routine healing: Secondary | ICD-10-CM | POA: Diagnosis not present

## 2017-10-31 ENCOUNTER — Ambulatory Visit (INDEPENDENT_AMBULATORY_CARE_PROVIDER_SITE_OTHER): Payer: Medicare Other | Admitting: Family Medicine

## 2017-10-31 ENCOUNTER — Encounter: Payer: Self-pay | Admitting: Family Medicine

## 2017-10-31 VITALS — BP 126/82 | Ht 65.0 in | Wt 146.2 lb

## 2017-10-31 DIAGNOSIS — I4891 Unspecified atrial fibrillation: Secondary | ICD-10-CM | POA: Diagnosis not present

## 2017-10-31 DIAGNOSIS — I10 Essential (primary) hypertension: Secondary | ICD-10-CM | POA: Diagnosis not present

## 2017-10-31 DIAGNOSIS — I2510 Atherosclerotic heart disease of native coronary artery without angina pectoris: Secondary | ICD-10-CM

## 2017-10-31 MED ORDER — ONDANSETRON 4 MG PO TBDP: 4.0000 mg | ORAL_TABLET | Freq: Three times a day (TID) | ORAL | 2 refills | Status: DC | PRN

## 2017-10-31 NOTE — Progress Notes (Signed)
   Subjective:    Patient ID: Brenda Hall, female    DOB: 1930/07/01, 84 y.o.   MRN: [id number]  HPI Pt here for hospital followup. Was in [facility] for 5 days, then [facility] for 13 days, went home for 2 days and then went to [facility] for 4 days. Pt is taking her original medications. Sister is here with patient today. Sister states that daughter said that hospital was not giving patient all the meds on her list. Sister has med from [facility].   Not eating aw well yet.  Tho improving with p o intake, now on ensure and sems to be helping  Patient is due to start physical therapy shortly  Patient had a challenging protracted hospitalization.  She had rapid atrial fibrillation.  Was treated with intravenous diltiazem.  Then started on metoprolol.  Handling this well.  Overall seems to be helping her heart rate  Had a protracted challenge with nausea and vomiting.  Felt to be potentially due to a viral gastroenteritis she may have gotten at the rehab facility.  Patient reports overall her abdomen is improving  Patient notes ongoing weakness.  Family wonders if she would qualify for some home sitting support through home health.    Review of Systems No headache, no major weight loss or weight gain, no chest pain no back pain abdominal pain no change in bowel habits complete ROS otherwise negative     Objective:   Physical Exam  Alert and oriented, vitals reviewed and stable, NAD patient is visibly we can ENT-TM's and ext canals WNL bilat via otoscopic exam Soft palate, tonsils and post pharynx WNL via oropharyngeal exam Neck-symmetric, no masses; thyroid nonpalpable and nontender Pulmonary-no tachypnea or accessory muscle use; Clear without wheezes via auscultation Card--no abnrml murmurs, rhythm irregular however rate WNL Carotid pulses symmetric, without bruits       Assessment & Plan:  Impression atrial fibrillation control improved.  Discussed to maintain same meds  2.   Gastroenteritis stable improving continue same  3.  Chronic back pain.  See prior notes.  4.  Polymyalgia rheumatica followed by specialist  5.  Hypertension.  Blood pressure decent on current level  6.  Social concerns challenging but home health does not provide daily sitter family and patient educated on this  Follow-up as scheduled

## 2017-11-03 ENCOUNTER — Encounter: Payer: Self-pay | Admitting: Rheumatology

## 2017-11-03 ENCOUNTER — Ambulatory Visit (INDEPENDENT_AMBULATORY_CARE_PROVIDER_SITE_OTHER): Payer: Medicare Other | Admitting: Rheumatology

## 2017-11-03 VITALS — BP 140/67 | HR 68 | Resp 14 | Ht 63.0 in | Wt 147.0 lb

## 2017-11-03 DIAGNOSIS — E559 Vitamin D deficiency, unspecified: Secondary | ICD-10-CM

## 2017-11-03 DIAGNOSIS — M5136 Other intervertebral disc degeneration, lumbar region: Secondary | ICD-10-CM | POA: Diagnosis not present

## 2017-11-03 DIAGNOSIS — I10 Essential (primary) hypertension: Secondary | ICD-10-CM

## 2017-11-03 DIAGNOSIS — M353 Polymyalgia rheumatica: Secondary | ICD-10-CM

## 2017-11-03 DIAGNOSIS — C4491 Basal cell carcinoma of skin, unspecified: Secondary | ICD-10-CM

## 2017-11-03 DIAGNOSIS — M810 Age-related osteoporosis without current pathological fracture: Secondary | ICD-10-CM | POA: Diagnosis not present

## 2017-11-03 DIAGNOSIS — M1612 Unilateral primary osteoarthritis, left hip: Secondary | ICD-10-CM

## 2017-11-03 DIAGNOSIS — I2510 Atherosclerotic heart disease of native coronary artery without angina pectoris: Secondary | ICD-10-CM

## 2017-11-03 DIAGNOSIS — M19041 Primary osteoarthritis, right hand: Secondary | ICD-10-CM

## 2017-11-03 DIAGNOSIS — M19071 Primary osteoarthritis, right ankle and foot: Secondary | ICD-10-CM

## 2017-11-03 DIAGNOSIS — Z8719 Personal history of other diseases of the digestive system: Secondary | ICD-10-CM

## 2017-11-03 DIAGNOSIS — Z8709 Personal history of other diseases of the respiratory system: Secondary | ICD-10-CM

## 2017-11-03 DIAGNOSIS — Z7952 Long term (current) use of systemic steroids: Secondary | ICD-10-CM

## 2017-11-03 DIAGNOSIS — I4891 Unspecified atrial fibrillation: Secondary | ICD-10-CM

## 2017-11-03 DIAGNOSIS — Z8781 Personal history of (healed) traumatic fracture: Secondary | ICD-10-CM | POA: Diagnosis not present

## 2017-11-03 DIAGNOSIS — M19042 Primary osteoarthritis, left hand: Secondary | ICD-10-CM

## 2017-11-03 DIAGNOSIS — M19072 Primary osteoarthritis, left ankle and foot: Secondary | ICD-10-CM

## 2017-11-03 DIAGNOSIS — Z96653 Presence of artificial knee joint, bilateral: Secondary | ICD-10-CM | POA: Diagnosis not present

## 2017-11-03 DIAGNOSIS — Z8673 Personal history of transient ischemic attack (TIA), and cerebral infarction without residual deficits: Secondary | ICD-10-CM

## 2017-11-03 NOTE — Progress Notes (Signed)
Office Visit Note  Patient: Brenda Hall             Date of Birth: [birth year]           MRN: [id number]             PCP: [name], MD Referring: [name], MD Visit Date: 11/03/2017 Occupation: @GUAROCC @  Subjective:  Fatigue.   History of Present Illness: Anayah H Prospero is a 84 y.o. female with history of polymyalgia, osteoporosis and osteoarthritis.  Patient states that she has had 2 procedures for kyphoplasty since her last visit.  She continues to have some lower back..  She also has osteoarthritis in multiple joints which causes discomfort.  She states she was taken off the prednisone and then she restarted prednisone at 5 mg p.o. daily as she felt her polymyalgia was coming back.  Activities of Daily Living:  Patient reports morning stiffness for 0 minute.   Patient Denies nocturnal pain.  Difficulty dressing/grooming: Reports Difficulty climbing stairs: Reports Difficulty getting out of chair: Reports Difficulty using hands for taps, buttons, cutlery, and/or writing: Denies  Review of Systems  Constitutional: Positive for fatigue. Negative for night sweats, weight gain and weight loss.  HENT: Negative for mouth sores, trouble swallowing, trouble swallowing, mouth dryness and nose dryness.   Eyes: Negative for pain, redness, visual disturbance and dryness.  Respiratory: Negative for cough, shortness of breath and difficulty breathing.   Cardiovascular: Positive for palpitations. Negative for chest pain, hypertension, irregular heartbeat and swelling in legs/feet.  Gastrointestinal: Negative for blood in stool, constipation and diarrhea.  Endocrine: Negative for increased urination.  Genitourinary: Negative for vaginal dryness.  Musculoskeletal: Positive for arthralgias and joint pain. Negative for joint swelling, myalgias, muscle weakness, morning stiffness, muscle tenderness and myalgias.  Skin: Negative for color change, rash, hair loss, skin  tightness, ulcers and sensitivity to sunlight.  Allergic/Immunologic: Negative for susceptible to infections.  Neurological: Negative for dizziness, memory loss, night sweats and weakness.  Hematological: Negative for swollen glands.  Psychiatric/Behavioral: Negative for depressed mood and sleep disturbance. The patient is not nervous/anxious.     PMFS History:  Patient Active Problem List   Diagnosis Date Noted  . Atrial fibrillation with RVR ([place]) 10/24/2017  . Nausea 10/24/2017  . Nausea 10/24/2017  . Acute bilateral low back pain without sciatica   . E. coli UTI   . Community acquired pneumonia   . L1 vertebral fracture ([place]) 09/27/2017  . UTI (urinary tract infection) 09/27/2017  . CKD (chronic kidney disease), stage III ([place])   . History of TIA (transient ischemic attack)   . PAF (paroxysmal atrial fibrillation) ([place])   . Atrial fibrillation with rapid ventricular response ([place]) 02/12/2017  . Hyperlipidemia 02/12/2017  . Pulmonary edema 02/12/2017  . Urgency of micturition 02/12/2017  . Osteopenia of multiple sites 07/23/2016  . Age-related osteoporosis without current pathological fracture 07/11/2016  . History of total knee replacement, bilateral 07/11/2016  . History of COPD 07/11/2016  . Spondylosis of lumbar region without myelopathy or radiculopathy 07/11/2016  . Primary osteoarthritis of both hands 07/11/2016  . Primary osteoarthritis of both feet 07/11/2016  . Vitamin D deficiency 07/11/2016  . History of vertebral fracture 07/11/2016  . History of IBS 07/11/2016  . Basal cell carcinoma 07/11/2016  . History of CVA (cerebrovascular accident)/ has metal implant not MRI safe  07/11/2016  . Other and unspecified hyperlipidemia 11/19/2012  . Cerebrovascular accident (stroke) ([place]) 11/19/2012  . Difficulty in walking(719.7)  11/06/2012  . Occlusion and stenosis of carotid artery without mention of cerebral infarction 02/07/2012  . OLD MYOCARDIAL INFARCTION 11/07/2009    . CAD, NATIVE VESSEL 11/07/2009  . Essential hypertension 09/28/2008  . IRRITABLE BOWEL SYNDROME 09/28/2008  . DEGENERATIVE JOINT DISEASE, KNEE 09/28/2008  . POLYMYALGIA RHEUMATICA 09/28/2008  . Chest pain, unspecified 09/28/2008    Past Medical History:  Diagnosis Date  . Anemia   . CAD in native artery    a. NSTEMI 12/2005 s/p BMS to Cx, prior normal LVEF.  Marland Kitchen Cancer ([place]) 1988 and  2009   BCC nose and  Melanoma back  . Cancer of skin Jan. 2014   [facility] Forehead and Nose  . Carotid stenosis    mild  . Chronic back pain   . CKD (chronic kidney disease), stage III ([place])   . Coronary atherosclerosis of unspecified type of vessel, native or graft   . DJD (degenerative joint disease) of knee   . History of TIA (transient ischemic attack)   . HTN (hypertension)   . IBS (irritable bowel syndrome)   . Myocardial infarction [facility])    5 yrs ago     dr wall  cardiac  . Osteoporosis   . Other diseases of lung, not elsewhere classified    lung nodule  . Polymyalgia rheumatica ([place])   . Shingles 01/2016-02/2016  . Stroke [facility])     Family History  Problem Relation Age of Onset  . Heart disease Father   . Hypertension Father   . Heart attack Father   . Cancer Mother        bladder   . Hypertension Daughter   . Atrial fibrillation Daughter   . Hypertension Son   . Colon cancer Neg Hx    Past Surgical History:  Procedure Laterality Date  . ABDOMINAL HYSTERECTOMY  1977   partial  . BACK SURGERY  1982  . CARDIAC CATHETERIZATION    . COLONOSCOPY N/A 01/27/2013   Procedure: COLONOSCOPY;  Surgeon: Rogene Houston, MD;  Location: [facility]ITE;  Service: Endoscopy;  Laterality: N/A;  1030  . EYE SURGERY Bilateral 04/2017  . EYE SURGERY Right 09/23/2017  . IR KYPHO LUMBAR INC FX REDUCE BONE BX UNI/BIL CANNULATION INC/IMAGING  09/30/2017  . IR VERTEBROPLASTY CERV/THOR BX INC UNI/BIL INC/INJECT/IMAGING  09/05/2017  . JOINT REPLACEMENT  2001 and  2011   6 knee surgeries   bil replacement   . KNEE ARTHROSCOPY    . LUMBAR LAMINECTOMY    . PR VEIN BYPASS GRAFT,AORTO-FEM-POP  2010  . removal of melanoma from back  2007  . SPINE SURGERY  2013   Broken back: fusion surgery  . tonisllectomy  1944  . total knee anthroplasty    . VERTEBROPLASTY  07/11/2011   Procedure: VERTEBROPLASTY;  Surgeon: Faythe Ghee, MD;  Location: [place] NEURO ORS;  Service: Neurosurgery;  Laterality: N/A;  Vertebroplasty of Lumbar Two   Social History   Social History Narrative   Married, retired. 3 children. Regular exercise -yes; hobbies = swimming.    Objective: Vital Signs: BP 140/67 (BP Location: Left Arm, Patient Position: Sitting, Cuff Size: Normal)   Pulse 68   Resp 14   Ht 5\' 3"  (1.6 m)   Wt 147 lb (66.7 kg)   BMI 26.04 kg/m    Physical Exam  Constitutional: She is oriented to person, place, and time. She appears well-developed and well-nourished.  HENT:  Head: Normocephalic and atraumatic.  Eyes: Conjunctivae and EOM are  normal.  Neck: Normal range of motion.  Cardiovascular: Normal rate, regular rhythm, normal heart sounds and intact distal pulses.  Pulmonary/Chest: Effort normal and breath sounds normal.  Abdominal: Soft. Bowel sounds are normal.  Lymphadenopathy:    She has no cervical adenopathy.  Neurological: She is alert and oriented to person, place, and time.  Skin: Skin is warm and dry. Capillary refill takes less than 2 seconds.  Psychiatric: She has a normal mood and affect. Her behavior is normal.  Nursing note and vitals reviewed.    Musculoskeletal Exam: Thoracic kyphosis was noted.  She has limited range of motion of thoracic and lumbar spine.  Shoulder joints elbow joints wrist joints were in good range of motion.  She has DIP PIP thickening in her hands and feet consistent with osteoarthritis.  Bilateral total knee replacements doing well.  CDAI Exam: No CDAI exam completed.   Investigation: Findings:  May 23, 2015 DXA One third left distal radius,  lowest site measured, BMD: 0.601, T score: -1.6 Right femur BMD 0.730, T score -1.10, 10% change in BMD from November 17, 2012 Left femur BMD 0.720, T score: -1.2, 0.0% change in BMD from November 17, 2012    Imaging: Dg Cervical Spine With Flex & Extend  Result Date: 10/26/2017 CLINICAL DATA:  Acute neck pain EXAM: CERVICAL SPINE COMPLETE WITH FLEXION AND EXTENSION VIEWS COMPARISON:  CT cervical spine 03/08/2013 FINDINGS: Prevertebral soft tissues normal thickness. Bones diffusely demineralized. Disc space narrowing and endplate spur formation at C3-C4 through C6-C7. Mild retrolisthesis at C4-C5 C5-C6. Minimal anterolisthesis C3-C4. Alignments grossly stable since prior CT. No fracture or bone destruction. Multilevel facet degenerative changes. With flexion, anterolisthesis at C3-C4 increases from 1.7 mm to 3.0 mm. No additional abnormal motion with flexion or extension. Extensive atherosclerotic calcifications at the carotid bifurcations. IMPRESSION: Multilevel degenerative disc and facet disease changes of the cervical spine. Minimal anterolisthesis at C3-C4 and retrolisthesis at C4-C5 and C5-C6. Slightly increased anterolisthesis at C3-C4 with flexion, with no additional abnormal motion identified. Extensive atherosclerotic calcifications at the carotid bifurcations bilaterally; consider follow-up non emergent carotid duplex sonography to further assess. Electronically Signed   By: Lavonia Dana M.D.   On: 10/26/2017 19:57   Ct Soft Tissue Neck Wo Contrast  Result Date: 10/26/2017 CLINICAL DATA:  84 y/o F; neck pain, difficulty swallowing medicine, neck spasms. No known injury. EXAM: CT NECK WITHOUT CONTRAST TECHNIQUE: Multidetector CT imaging of the neck was performed following the standard protocol without intravenous contrast. COMPARISON:  10/26/2017 cervical spine radiographs. 03/08/2013 CT cervical spine. FINDINGS: Pharynx and larynx: Normal. No mass or swelling. Salivary glands: No inflammation, mass, or  stone. Thyroid: Normal. Lymph nodes: None enlarged or abnormal density. Vascular: Extensive calcific atherosclerosis of the carotid bifurcations and carotid siphons. Limited intracranial: Negative. Visualized orbits: Negative. Mastoids and visualized paranasal sinuses: Clear. Skeleton: Advanced cervical spondylosis with multilevel disc and facet degenerative changes greatest at the C4-C7 levels. Right-sided uncovertebral and facet hypertrophy with bony foraminal encroachment at the C4-C6 levels and on the left at C3-C7. No high-grade bony canal stenosis. Grade 1 C3-4 anterolisthesis. Upper chest: Biapical pleuroparenchymal scarring. Other: None. IMPRESSION: 1. Advanced cervical spondylosis with multilevel disc and facet degenerative changes greatest at the C4-C7 levels. 2. Calcific atherosclerosis of carotid bifurcations and carotid siphons. Electronically Signed   By: Kristine Garbe M.D.   On: 10/26/2017 20:36   US Carotid Bilateral  Result Date: 10/27/2017 CLINICAL DATA:  84 year old female with a history of cardiovascular risk factors: Hypertension, known  coronary disease EXAM: BILATERAL CAROTID DUPLEX ULTRASOUND TECHNIQUE: Pearline Cables scale imaging, color Doppler and duplex ultrasound were performed of bilateral carotid and vertebral arteries in the neck. COMPARISON:  None. FINDINGS: Criteria: Quantification of carotid stenosis is based on velocity parameters that correlate the residual internal carotid diameter with NASCET-based stenosis levels, using the diameter of the distal internal carotid lumen as the denominator for stenosis measurement. The following velocity measurements were obtained: RIGHT ICA:  Systolic 250 cm/sec, Diastolic 21 cm/sec CCA:  77 cm/sec SYSTOLIC ICA/CCA RATIO:  1.5 ECA:  230 cm/sec LEFT ICA:  Systolic 539 cm/sec, Diastolic 39 cm/sec CCA:  97 cm/sec SYSTOLIC ICA/CCA RATIO:  None 1.2 ECA:  75 cm/sec Right Brachial SBP: Not acquired Left Brachial SBP: Not acquired RIGHT CAROTID  ARTERY: No significant calcifications of the right common carotid artery. Intermediate waveform maintained. Heterogeneous and partially calcified plaque at the right carotid bifurcation. No significant lumen shadowing. Low resistance waveform of the right ICA. No significant tortuosity. RIGHT VERTEBRAL ARTERY: Antegrade flow with low resistance waveform. LEFT CAROTID ARTERY: No significant calcifications of the left common carotid artery. Intermediate waveform maintained. Heterogeneous and partially calcified plaque at the left carotid bifurcation without significant lumen shadowing. Low resistance waveform of the left ICA. No significant tortuosity. LEFT VERTEBRAL ARTERY:  Antegrade flow with low resistance waveform. IMPRESSION: Color duplex indicates minimal heterogeneous and calcified plaque, with no hemodynamically significant stenosis by duplex criteria in the extracranial cerebrovascular circulation. Signed, Dulcy Fanny. Dellia Nims, RPVI [facility] Radiology Electronically Signed   By: Corrie Mckusick D.O.   On: 10/27/2017 14:51   Dg Chest Portable 1 View  Result Date: 10/24/2017 CLINICAL DATA:  Tachycardia. EXAM: PORTABLE CHEST 1 VIEW COMPARISON:  Radiograph of [place]iani 10, 2019. FINDINGS: Stable cardiomegaly with central pulmonary vascular congestion is noted. No pneumothorax or significant pleural effusion is noted. No consolidative process is noted. Bony thorax is unremarkable. IMPRESSION: Stable cardiomegaly with central pulmonary vascular congestion. Electronically Signed   By: Marijo Conception, M.D.   On: 10/24/2017 14:57    Recent Labs: Lab Results  Component Value Date   WBC 8.0 10/25/2017   HGB 11.6 (L) 10/25/2017   PLT 499 (H) 10/25/2017   NA 139 10/26/2017   K 4.0 10/26/2017   CL 103 10/26/2017   CO2 28 10/26/2017   GLUCOSE 100 (H) 10/26/2017   BUN 18 10/26/2017   CREATININE 1.22 (H) 10/26/2017   BILITOT 1.1 10/26/2017   ALKPHOS 68  10/26/2017   AST 16 10/26/2017   ALT 10 10/26/2017   PROT 6.4 (L) 10/26/2017   ALBUMIN 2.8 (L) 10/26/2017   CALCIUM 8.8 (L) 10/26/2017   GFRAA 46 (L) 10/26/2017    Speciality Comments: No specialty comments available.  Procedures:  No procedures performed Allergies: Epinephrine; Clopidogrel bisulfate; and Oxycodone   Assessment / Plan:     Visit Diagnoses: Polymyalgia rheumatica (HCC)-patient appears to be in remission.  She was taken off the prednisone which she restarted herself at 5 mg p.o. daily.  I have advised her to taper it by 1 mg every month until she gets of prednisone.  She had no muscular weakness or tenderness on examination today.  She has some generalized deconditioning and having difficulty with mobility without the help of walker.  She has physical therapy coming over to her house.  On prednisone therapy-she is currently on 5 mg p.o. daily.  She will taper by 5 mg every month.  Primary osteoarthritis of both hands-joint protection  muscle strengthening discussed.  Primary osteoarthritis of left hip she is not having much discomfort.-  Primary osteoarthritis of both feet-she continues to have some pain in her feet.  History of total knee replacement, bilateral-chronic pain.  DDD (degenerative disc disease), lumbar-chronic pain.   Age-related osteoporosis without current pathological fracture-her-   Her bone density was done in 2017.  She missed her bone density in 2019.  There was improvement in her bone density.  She was on Fosamax from 2014-2016.  She had remarkable improvement in her bone density.  The vertebral bone density was not performed I believe due to previous kyphoplasty.  History of vertebral fracturePatient reports having 2 kyphoplasty this year due to vertebral fracture.  Ideally she should be on Forteo or Tymlos due to history of vertebral fracture.  I will obtain DEXA scan and will reassess the situation.  Vitamin D deficiency-I will check a vitamin  D level today.  Her calcium was low.  Use of calcium supplement was discussed.  Atrial fibrillation with rapid ventricular response (HCC) - On Xarelto  Essential hypertension  History of CVA (cerebrovascular accident)/ has metal implant not MRI safe   History of IBS  History of COPD  Basal cell carcinoma (BCC), unspecified site   Orders: Orders Placed This Encounter  Procedures  . Parathyroid hormone, intact (no Ca)  . VITAMIN D 25 Hydroxy (Vit-D Deficiency, Fractures)  . Serum protein electrophoresis with reflex  . COMPLETE METABOLIC PANEL WITH GFR   No orders of the defined types were placed in this encounter.   Face-to-face time spent with patient was 30 minutes. Greater than 50% of time was spent in counseling and coordination of care.  Follow-Up Instructions: Return in about 1 month (around 12/01/2017) for Osteoarthritis, PMR, OP.   Bo Merino, MD  Note - This record has been created using Editor, commissioning.  Chart creation errors have been sought, but may not always  have been located. Such creation errors do not reflect on  the standard of medical care.

## 2017-11-04 DIAGNOSIS — J449 Chronic obstructive pulmonary disease, unspecified: Secondary | ICD-10-CM | POA: Diagnosis not present

## 2017-11-04 DIAGNOSIS — N183 Chronic kidney disease, stage 3 (moderate): Secondary | ICD-10-CM | POA: Diagnosis not present

## 2017-11-04 DIAGNOSIS — I2510 Atherosclerotic heart disease of native coronary artery without angina pectoris: Secondary | ICD-10-CM | POA: Diagnosis not present

## 2017-11-04 DIAGNOSIS — M545 Low back pain: Secondary | ICD-10-CM | POA: Diagnosis not present

## 2017-11-04 DIAGNOSIS — I129 Hypertensive chronic kidney disease with stage 1 through stage 4 chronic kidney disease, or unspecified chronic kidney disease: Secondary | ICD-10-CM | POA: Diagnosis not present

## 2017-11-04 DIAGNOSIS — S32019D Unspecified fracture of first lumbar vertebra, subsequent encounter for fracture with routine healing: Secondary | ICD-10-CM | POA: Diagnosis not present

## 2017-11-04 NOTE — Progress Notes (Signed)
Vitamin D is low.  She should take vitamin D 1000 units daily.  Her creatinine is elevated.  Please forward lab results to her PCP.

## 2017-11-04 NOTE — Care Management (Signed)
CM contacted by Brenda Hall, pt's daughter in law, with concerns about families ability to care for pt. Brenda Hall states family is taking turns staying with care caring for Brenda Hall but they are struggling. Pt was at PCP's office earlier this week and did not want to order aid services because "insurance would not pay for them". (CM explained what services, including aid services, medicare would and would not cover.) Brenda Hall has contacted [employer], pt's secondary payor and they say they will pay for aid services. CM provided contact info for the AD[facility] services in [facility].   Also per Brenda Hall [facility] PT made first visit since DC today, RN was ordered on resumption orders as well but no RN has visited. CM placed call to follow up with [employer] rep.

## 2017-11-05 ENCOUNTER — Encounter: Payer: Self-pay | Admitting: Family Medicine

## 2017-11-05 DIAGNOSIS — M545 Low back pain: Secondary | ICD-10-CM | POA: Diagnosis not present

## 2017-11-05 DIAGNOSIS — N183 Chronic kidney disease, stage 3 (moderate): Secondary | ICD-10-CM | POA: Diagnosis not present

## 2017-11-05 DIAGNOSIS — I2510 Atherosclerotic heart disease of native coronary artery without angina pectoris: Secondary | ICD-10-CM | POA: Diagnosis not present

## 2017-11-05 DIAGNOSIS — I129 Hypertensive chronic kidney disease with stage 1 through stage 4 chronic kidney disease, or unspecified chronic kidney disease: Secondary | ICD-10-CM | POA: Diagnosis not present

## 2017-11-05 DIAGNOSIS — J449 Chronic obstructive pulmonary disease, unspecified: Secondary | ICD-10-CM | POA: Diagnosis not present

## 2017-11-05 DIAGNOSIS — S32019D Unspecified fracture of first lumbar vertebra, subsequent encounter for fracture with routine healing: Secondary | ICD-10-CM | POA: Diagnosis not present

## 2017-11-06 LAB — COMPLETE METABOLIC PANEL WITH GFR
AG RATIO: 1.1 (calc) (ref 1.0–2.5)
ALT: 13 U/L (ref 6–29)
AST: 21 U/L (ref 10–35)
Albumin: 3.5 g/dL — ABNORMAL LOW (ref 3.6–5.1)
Alkaline phosphatase (APISO): 82 U/L (ref 33–130)
BUN/Creatinine Ratio: 14 (calc) (ref 6–22)
BUN: 20 mg/dL (ref 7–25)
CALCIUM: 9.7 mg/dL (ref 8.6–10.4)
CO2: 32 mmol/L (ref 20–32)
Chloride: 101 mmol/L (ref 98–110)
Creat: 1.38 mg/dL — ABNORMAL HIGH (ref 0.60–0.88)
GFR, EST NON AFRICAN AMERICAN: 35 mL/min/{1.73_m2} — AB (ref >=60)
GFR, Est African American: 41 mL/min/{1.73_m2} — ABNORMAL LOW (ref >=60)
Globulin: 3.2 g/dL (calc) (ref 1.9–3.7)
Glucose, Bld: 99 mg/dL (ref 65–99)
POTASSIUM: 4.4 mmol/L (ref 3.5–5.3)
Sodium: 141 mmol/L (ref 135–146)
Total Bilirubin: 0.3 mg/dL (ref 0.2–1.2)
Total Protein: 6.7 g/dL (ref 6.1–8.1)

## 2017-11-06 LAB — PROTEIN ELECTROPHORESIS, SERUM, WITH REFLEX
ALPHA 1: 0.4 g/dL — AB (ref 0.2–0.3)
ALPHA 2: 1.1 g/dL — AB (ref 0.5–0.9)
Albumin ELP: 3 g/dL — ABNORMAL LOW (ref 3.8–4.8)
Beta 2: 0.4 g/dL (ref 0.2–0.5)
Beta Globulin: 0.5 g/dL (ref 0.4–0.6)
GAMMA GLOBULIN: 1.1 g/dL (ref 0.8–1.7)
Total Protein: 6.6 g/dL (ref 6.1–8.1)

## 2017-11-06 LAB — VITAMIN D 25 HYDROXY (VIT D DEFICIENCY, FRACTURES): Vit D, 25-Hydroxy: 33 ng/mL (ref 30–100)

## 2017-11-06 LAB — IFE INTERPRETATION

## 2017-11-06 LAB — PARATHYROID HORMONE, INTACT (NO CA): PTH: 68 pg/mL — AB (ref 14–64)

## 2017-11-06 NOTE — Progress Notes (Signed)
I discussed lab results with patient.  I have advised her to take vitamin D 1000 units every day.  Abnormal IFE was discussed.  We will make a referral to hematology.  She has DEXA scheduled tomorrow.

## 2017-11-07 ENCOUNTER — Ambulatory Visit (HOSPITAL_COMMUNITY)
Admission: RE | Admit: 2017-11-07 | Discharge: 2017-11-07 | Disposition: A | Discharge status: Home / Self Care | Payer: Medicare Other | Source: Ambulatory Visit | Attending: Radiology | Admitting: Radiology

## 2017-11-07 DIAGNOSIS — S32010A Wedge compression fracture of first lumbar vertebra, initial encounter for closed fracture: Secondary | ICD-10-CM | POA: Diagnosis not present

## 2017-11-07 DIAGNOSIS — S32019A Unspecified fracture of first lumbar vertebra, initial encounter for closed fracture: Secondary | ICD-10-CM

## 2017-11-07 DIAGNOSIS — M8589 Other specified disorders of bone density and structure, multiple sites: Secondary | ICD-10-CM | POA: Diagnosis not present

## 2017-11-07 NOTE — Consult Note (Signed)
Chief Complaint: Patient was seen in consultation today for lumbar 1 compression fracture s/p kyphoplasty.  Referring Physician(s): Elgergawy, Silver Huguenin  Supervising Physician: Luanne Bras  Patient Status: [facility] - Out-pt  History of Present Illness: Brenda Hall is a 84 y.o. female with a past medical history of hypertension, CAD, MI 2007, afib, carotid stenosis, hyperlipidemia, TIA, CVA, severe osteoporosis, osteoarthritis, polymyalgia rheumatica, lumbar 2 compression fracture s/p vertebroplasty 06/2011 by Dr. Hal Neer, and thoracic 12 compression fracture s/p vertebroplasty 09/05/2017 by Dr. Estanislado Pandy.She underwent a lumbar 1 kyphoplasty 09/30/2017 with Dr. Estanislado Pandy due to her lumbar 1 compression fracture.  Patient presents today for follow-up regarding her recent procedure 09/30/2017. Patient awake and alert sitting in chair. Accompanied by son. Complains of lower back pain. States that following procedure, she felt lower back pain for 3 weeks. After those 3 weeks, she felt complete relief of back pain and rated her pain 0/10. However, today she underwent a bone density scan and had to lay completely flat for scan. States after this scan she developed back pain, rated 3/10 at this time. States she does not take anything for back pain. States she sees PT and OT regularly to help return to baseline function. Denies fever, chills, numbness/tingling down legs, or bowel incontinence. States she has had bladder incontinence for years, stable at this time.   Past Medical History:  Diagnosis Date  . Anemia   . CAD in native artery    a. NSTEMI 12/2005 s/p BMS to Cx, prior normal LVEF.  Marland Kitchen Cancer ([place]) 1988 and  2009   BCC nose and  Melanoma back  . Cancer of skin Jan. 2014   [facility]  . Carotid stenosis    mild  . Chronic back pain   . CKD (chronic kidney disease), stage III ([place])   . Coronary atherosclerosis of unspecified type of vessel, native or graft   . DJD  (degenerative joint disease) of knee   . History of TIA (transient ischemic attack)   . HTN (hypertension)   . IBS (irritable bowel syndrome)   . Myocardial infarction [facility])    5 yrs ago     dr wall  cardiac  . Osteoporosis   . Other diseases of lung, not elsewhere classified    lung nodule  . Polymyalgia rheumatica ([place])   . Shingles 01/2016-02/2016  . Stroke [facility])     Past Surgical History:  Procedure Laterality Date  . ABDOMINAL HYSTERECTOMY  1977   partial  . BACK SURGERY  1982  . CARDIAC CATHETERIZATION    . COLONOSCOPY N/A 01/27/2013   Procedure: COLONOSCOPY;  Surgeon: Rogene Houston, MD;  Location: [facility] ENDO SUITE;  Service: Endoscopy;  Laterality: N/A;  1030  . EYE SURGERY Bilateral 04/2017  . EYE SURGERY Right 09/23/2017  . IR KYPHO LUMBAR INC FX REDUCE BONE BX UNI/BIL CANNULATION INC/IMAGING  09/30/2017  . IR VERTEBROPLASTY CERV/THOR BX INC UNI/BIL INC/INJECT/IMAGING  09/05/2017  . JOINT REPLACEMENT  2001 and  2011   6 knee surgeries   bil replacement  . KNEE ARTHROSCOPY    . LUMBAR LAMINECTOMY    . PR VEIN BYPASS GRAFT,AORTO-FEM-POP  2010  . removal of melanoma from back  2007  . SPINE SURGERY  2013   Broken back: fusion surgery  . tonisllectomy  1944  . total knee anthroplasty    . VERTEBROPLASTY  07/11/2011   Procedure: VERTEBROPLASTY;  Surgeon: Faythe Ghee, MD;  Location: [place] NEURO ORS;  Service:  Neurosurgery;  Laterality: N/A;  Vertebroplasty of Lumbar Two    Allergies: Epinephrine; Clopidogrel bisulfate; and Oxycodone  Medications: Prior to Admission medications   Medication Sig Start Date End Date Taking? Authorizing Provider  acetaminophen (TYLENOL) 500 MG tablet Take 500 mg by mouth every 6 (six) hours as needed for mild pain.    [provider]  amLODipine (NORVASC) 5 MG tablet Take 5 mg by mouth daily.    [provider]  aspirin 81 MG tablet Take 81 mg by mouth daily.    [provider]  atorvastatin (LIPITOR) 80 MG  tablet Take 1 tablet (80 mg total) by mouth at bedtime. 05/29/17   [name], MD  Calcium Carbonate-Vit D-Min (CALCIUM 1200 PO) Take 1 tablet by mouth at bedtime.     [provider]  estrogens, conjugated, (PREMARIN) 0.3 MG tablet Take 1 tablet (0.3 mg total) by mouth every evening. 02/14/17   Johnson, Clanford L, MD  fluticasone (FLONASE) 50 MCG/ACT nasal spray USE TWO SPRAYS IN EACH NOSTRIL ONCE DAILY Patient taking differently: Place 1 spray into both nostrils daily as needed for allergies. USE TWO SPRAYS IN EACH NOSTRIL ONCE DAILY 12/03/16   [name], MD  furosemide (LASIX) 20 MG tablet Take 20 mg DAILY AS NEEDED FOR SWELLING. Patient not taking: Reported on 11/03/2017 09/02/17   Arnoldo Lenis, MD  gabapentin (NEURONTIN) 100 MG capsule Take 1 capsule (100 mg total) by mouth at bedtime. Patient not taking: Reported on 11/03/2017 08/27/17   Jessy Oto, MD  lidocaine (LIDODERM) 5 % Place 1 patch onto the skin daily. Remove & Discard patch within 12 hours or as directed by MD Patient not taking: Reported on 11/03/2017 10/02/17   Eugenie Filler, MD  loperamide (IMODIUM) 2 MG capsule Take 2 mg by mouth daily as needed for diarrhea or loose stools.     [provider]  methocarbamol (ROBAXIN) 500 MG tablet Take 2 tablets (1,000 mg total) by mouth every 8 (eight) hours as needed for muscle spasms. Patient not taking: Reported on 11/03/2017 10/02/17   Eugenie Filler, MD  metoprolol succinate (TOPROL-XL) 50 MG 24 hr tablet Take 1 and 1/2 Tablets At Night Patient taking differently: Take 75 mg by mouth every evening. Take 1 and 1/2 Tablets At Night 04/17/17   Lendon Colonel, NP  nitroGLYCERIN (NITROSTAT) 0.4 MG SL tablet Place 1 tablet (0.4 mg total) under the tongue every 5 (five) minutes as needed for chest pain. 12/03/16   [name], MD  ondansetron (ZOFRAN ODT) 4 MG disintegrating tablet Take 1 tablet (4 mg total) by mouth every 8 (eight) hours as  needed for nausea or vomiting. 10/31/17   [name], MD  pantoprazole (PROTONIX) 40 MG tablet TAKE 1 TABLET EVERY MORNING 10/20/17   Bo Merino, MD  predniSONE (DELTASONE) 5 MG tablet Take 5 mg by mouth daily with breakfast.    [provider]  senna-docusate (SENOKOT-S) 8.6-50 MG tablet Take 1 tablet by mouth at bedtime as needed for mild constipation. Patient not taking: Reported on 11/03/2017 10/02/17   Eugenie Filler, MD  traMADol (ULTRAM) 50 MG tablet Take 1-2 tablets (50-100 mg total) by mouth every 6 (six) hours as needed for moderate pain. Patient not taking: Reported on 11/03/2017 10/02/17   Eugenie Filler, MD  XARELTO 15 MG TABS tablet TAKE 1 TABLET DAILY WITH SUPPER 09/30/17   Arnoldo Lenis, MD  Zinc 50 MG CAPS Take 50  mg by mouth daily.    [provider]     Family History  Problem Relation Age of Onset  . Heart disease Father   . Hypertension Father   . Heart attack Father   . Cancer Mother        bladder   . Hypertension Daughter   . Atrial fibrillation Daughter   . Hypertension Son   . Colon cancer Neg Hx     Social History   Socioeconomic History  . Marital status: Widowed    Spouse name: Not on file  . Number of children: Not on file  . Years of education: Not on file  . Highest education level: Not on file  Occupational History  . Not on file  Social Needs  . Financial resource strain: Not on file  . Food insecurity:    Worry: Not on file    Inability: Not on file  . Transportation needs:    Medical: Not on file    Non-medical: Not on file  Tobacco Use  . Smoking status: Never Smoker  . Smokeless tobacco: Never Used  . Tobacco comment: tobacco use - no  Substance and Sexual Activity  . Alcohol use: Yes    Alcohol/week: 0.0 oz    Comment: occasionally a glass of wine or margarita per pt  . Drug use: No  . Sexual activity: Not on file  Lifestyle  . Physical activity:    Days per week: Not on file    Minutes  per session: Not on file  . Stress: Not on file  Relationships  . Social connections:    Talks on phone: Not on file    Gets together: Not on file    Attends religious service: Not on file    Active member of club or organization: Not on file    Attends meetings of clubs or organizations: Not on file    Relationship status: Not on file  Other Topics Concern  . Not on file  Social History Narrative   Married, retired. 3 children. Regular exercise -yes; hobbies = swimming.     Review of Systems: A 12 point ROS discussed and pertinent positives are indicated in the HPI above.  All other systems are negative.  Review of Systems  Constitutional: Negative for chills and fever.  Respiratory: Negative for shortness of breath and wheezing.   Cardiovascular: Negative for chest pain and palpitations.  Gastrointestinal:       Negative for bowel incontinence.  Genitourinary:       Positive for bladder incontinence.  Musculoskeletal: Positive for back pain.  Neurological: Negative for numbness.    Vital Signs: There were no vitals taken for this visit.  Physical Exam  Constitutional: She is oriented to person, place, and time. She appears well-developed and well-nourished. No distress.  Pulmonary/Chest: Effort normal. No respiratory distress.  Musculoskeletal:  Mild tenderness of lower back at approximate level of lumbar 1.  Neurological: She is alert and oriented to person, place, and time.  Skin: Skin is warm and dry.  Psychiatric: She has a normal mood and affect. Her behavior is normal. Judgment and thought content normal.  Nursing note and vitals reviewed.    Imaging: Dg Cervical Spine With Flex & Extend  Result Date: 10/26/2017 CLINICAL DATA:  Acute neck pain EXAM: CERVICAL SPINE COMPLETE WITH FLEXION AND EXTENSION VIEWS COMPARISON:  CT cervical spine 03/08/2013 FINDINGS: Prevertebral soft tissues normal thickness. Bones diffusely demineralized. Disc space narrowing and  endplate spur  formation at C3-C4 through C6-C7. Mild retrolisthesis at C4-C5 C5-C6. Minimal anterolisthesis C3-C4. Alignments grossly stable since prior CT. No fracture or bone destruction. Multilevel facet degenerative changes. With flexion, anterolisthesis at C3-C4 increases from 1.7 mm to 3.0 mm. No additional abnormal motion with flexion or extension. Extensive atherosclerotic calcifications at the carotid bifurcations. IMPRESSION: Multilevel degenerative disc and facet disease changes of the cervical spine. Minimal anterolisthesis at C3-C4 and retrolisthesis at C4-C5 and C5-C6. Slightly increased anterolisthesis at C3-C4 with flexion, with no additional abnormal motion identified. Extensive atherosclerotic calcifications at the carotid bifurcations bilaterally; consider follow-up non emergent carotid duplex sonography to further assess. Electronically Signed   By: Lavonia Dana M.D.   On: 10/26/2017 19:57   Ct Soft Tissue Neck Wo Contrast  Result Date: 10/26/2017 CLINICAL DATA:  84 y/o F; neck pain, difficulty swallowing medicine, neck spasms. No known injury. EXAM: CT NECK WITHOUT CONTRAST TECHNIQUE: Multidetector CT imaging of the neck was performed following the standard protocol without intravenous contrast. COMPARISON:  10/26/2017 cervical spine radiographs. 03/08/2013 CT cervical spine. FINDINGS: Pharynx and larynx: Normal. No mass or swelling. Salivary glands: No inflammation, mass, or stone. Thyroid: Normal. Lymph nodes: None enlarged or abnormal density. Vascular: Extensive calcific atherosclerosis of the carotid bifurcations and carotid siphons. Limited intracranial: Negative. Visualized orbits: Negative. Mastoids and visualized paranasal sinuses: Clear. Skeleton: Advanced cervical spondylosis with multilevel disc and facet degenerative changes greatest at the C4-C7 levels. Right-sided uncovertebral and facet hypertrophy with bony foraminal encroachment at the C4-C6 levels and on the left at C3-C7.  No high-grade bony canal stenosis. Grade 1 C3-4 anterolisthesis. Upper chest: Biapical pleuroparenchymal scarring. Other: None. IMPRESSION: 1. Advanced cervical spondylosis with multilevel disc and facet degenerative changes greatest at the C4-C7 levels. 2. Calcific atherosclerosis of carotid bifurcations and carotid siphons. Electronically Signed   By: Kristine Garbe M.D.   On: 10/26/2017 20:36   US Carotid Bilateral  Result Date: 10/27/2017 CLINICAL DATA:  84 year old female with a history of cardiovascular risk factors: Hypertension, known coronary disease EXAM: BILATERAL CAROTID DUPLEX ULTRASOUND TECHNIQUE: Pearline Cables scale imaging, color Doppler and duplex ultrasound were performed of bilateral carotid and vertebral arteries in the neck. COMPARISON:  None. FINDINGS: Criteria: Quantification of carotid stenosis is based on velocity parameters that correlate the residual internal carotid diameter with NASCET-based stenosis levels, using the diameter of the distal internal carotid lumen as the denominator for stenosis measurement. The following velocity measurements were obtained: RIGHT ICA:  Systolic 801 cm/sec, Diastolic 21 cm/sec CCA:  77 cm/sec SYSTOLIC ICA/CCA RATIO:  1.5 ECA:  230 cm/sec LEFT ICA:  Systolic 655 cm/sec, Diastolic 39 cm/sec CCA:  97 cm/sec SYSTOLIC ICA/CCA RATIO:  None 1.2 ECA:  75 cm/sec Right Brachial SBP: Not acquired Left Brachial SBP: Not acquired RIGHT CAROTID ARTERY: No significant calcifications of the right common carotid artery. Intermediate waveform maintained. Heterogeneous and partially calcified plaque at the right carotid bifurcation. No significant lumen shadowing. Low resistance waveform of the right ICA. No significant tortuosity. RIGHT VERTEBRAL ARTERY: Antegrade flow with low resistance waveform. LEFT CAROTID ARTERY: No significant calcifications of the left common carotid artery. Intermediate waveform maintained. Heterogeneous and partially calcified plaque at the  left carotid bifurcation without significant lumen shadowing. Low resistance waveform of the left ICA. No significant tortuosity. LEFT VERTEBRAL ARTERY:  Antegrade flow with low resistance waveform. IMPRESSION: Color duplex indicates minimal heterogeneous and calcified plaque, with no hemodynamically significant stenosis by duplex criteria in the extracranial cerebrovascular circulation. Signed, Dulcy Fanny. Earleen Newport DO, RPVI [facility] Electronically Signed   By: Corrie Mckusick D.O.   On: 10/27/2017 14:51   Dg Chest Portable 1 View  Result Date: 10/24/2017 CLINICAL DATA:  Tachycardia. EXAM: PORTABLE CHEST 1 VIEW COMPARISON:  Radiograph of Tashia 10, 2019. FINDINGS: Stable cardiomegaly with central pulmonary vascular congestion is noted. No pneumothorax or significant pleural effusion is noted. No consolidative process is noted. Bony thorax is unremarkable. IMPRESSION: Stable cardiomegaly with central pulmonary vascular congestion. Electronically Signed   By: Marijo Conception, M.D.   On: 10/24/2017 14:57    Labs:  CBC: Recent Labs    10/01/17 0708 10/02/17 0529 10/24/17 1359 10/24/17 1404 10/25/17 0702  WBC 7.7 9.7 11.2*  --  8.0  HGB 10.7* 11.2* 13.2 14.6 11.6*  HCT 35.4* 37.0 42.0 43.0 38.5  PLT 340 354 576*  --  499*    COAGS: Recent Labs    09/05/17 1116 09/30/17 1154  INR 0.98 1.01  APTT  --  23*    BMP: Recent Labs    10/24/17 1359 10/24/17 1404 10/25/17 0702 10/26/17 0647 11/03/17 1228  NA 140 140 140 139 141  K 4.2 4.1 4.2 4.0 4.4  CL 103 102 108 103 101  CO2 24  --  22 28 32  GLUCOSE 106* 98 94 100* 99  BUN 18 17 18 18 20   CALCIUM 9.2  --  8.6* 8.8* 9.7  CREATININE 1.37* 1.20* 1.12* 1.22* 1.38*  GFRNONAA 34*  --  44* 40* 35*  GFRAA 40*  --  51* 46* 41*    LIVER FUNCTION TESTS: Recent Labs    05/31/17 0858 10/24/17 1359 10/25/17 0702 10/26/17 0647 11/03/17 1228  BILITOT 0.4 0.9 1.1 1.1 0.3  AST 14  26 16 16 21   ALT 11 16 12 10 13   ALKPHOS 67 77 66 68  --   PROT 7.1 6.9 5.9* 6.4* 6.7  6.6  ALBUMIN 3.8 3.2* 2.7* 2.8*  --     TUMOR MARKERS: No results for input(s): AFPTM, CEA, CA199, CHROMGRNA in the last 8760 hours.  Assessment and Plan:  Lumbar 1 compression fracture s/p kyphoplasty 09/30/2017 with Dr. Estanislado Pandy. Reviewed imaging with patient and son. Discussed patient's back pain. Patient states that she is doing extremely well compared to prior to procedure 09/30/2017.  No follow-up needed at this time- informed patient that we are available for her in future if needed. Informed patient to feel free to call us with any questions/concerns.  All questions answered and concerns addressed. Patient and son convey understanding and agree with plan.  Thank you for this interesting consult.  I greatly enjoyed meeting Meilin H Mooney-Riggs and look forward to participating in their care.  A copy of this report was sent to the requesting provider on this date.  Electronically Signed: Earley Abide, PA-C 11/07/2017, 10:30 AM   I spent a total of 25 Minutes in face to face in clinical consultation, greater than 50% of which was counseling/coordinating care for lumbar 1 compression fracture s/p kyphoplasty.

## 2017-11-09 DIAGNOSIS — I129 Hypertensive chronic kidney disease with stage 1 through stage 4 chronic kidney disease, or unspecified chronic kidney disease: Secondary | ICD-10-CM | POA: Diagnosis not present

## 2017-11-09 DIAGNOSIS — S32019D Unspecified fracture of first lumbar vertebra, subsequent encounter for fracture with routine healing: Secondary | ICD-10-CM | POA: Diagnosis not present

## 2017-11-09 DIAGNOSIS — I2510 Atherosclerotic heart disease of native coronary artery without angina pectoris: Secondary | ICD-10-CM | POA: Diagnosis not present

## 2017-11-09 DIAGNOSIS — J449 Chronic obstructive pulmonary disease, unspecified: Secondary | ICD-10-CM | POA: Diagnosis not present

## 2017-11-09 DIAGNOSIS — N183 Chronic kidney disease, stage 3 (moderate): Secondary | ICD-10-CM | POA: Diagnosis not present

## 2017-11-09 DIAGNOSIS — M545 Low back pain: Secondary | ICD-10-CM | POA: Diagnosis not present

## 2017-11-10 ENCOUNTER — Telehealth: Payer: Self-pay | Admitting: *Deleted

## 2017-11-10 DIAGNOSIS — J449 Chronic obstructive pulmonary disease, unspecified: Secondary | ICD-10-CM | POA: Diagnosis not present

## 2017-11-10 DIAGNOSIS — I129 Hypertensive chronic kidney disease with stage 1 through stage 4 chronic kidney disease, or unspecified chronic kidney disease: Secondary | ICD-10-CM | POA: Diagnosis not present

## 2017-11-10 DIAGNOSIS — S32019D Unspecified fracture of first lumbar vertebra, subsequent encounter for fracture with routine healing: Secondary | ICD-10-CM | POA: Diagnosis not present

## 2017-11-10 DIAGNOSIS — R899 Unspecified abnormal finding in specimens from other organs, systems and tissues: Secondary | ICD-10-CM

## 2017-11-10 DIAGNOSIS — I2510 Atherosclerotic heart disease of native coronary artery without angina pectoris: Secondary | ICD-10-CM | POA: Diagnosis not present

## 2017-11-10 DIAGNOSIS — M545 Low back pain: Secondary | ICD-10-CM | POA: Diagnosis not present

## 2017-11-10 DIAGNOSIS — N183 Chronic kidney disease, stage 3 (moderate): Secondary | ICD-10-CM | POA: Diagnosis not present

## 2017-11-10 NOTE — Telephone Encounter (Signed)
-----   Message from Bo Merino, MD sent at 11/06/2017  4:50 PM EDT ----- I discussed lab results with patient.  I have advised her to take vitamin D 1000 units every day.  Abnormal IFE was discussed.  We will make a referral to hematology.  She has DEXA scheduled tomorrow.

## 2017-11-11 ENCOUNTER — Telehealth: Payer: Self-pay | Admitting: Oncology

## 2017-11-11 ENCOUNTER — Encounter: Payer: Self-pay | Admitting: Oncology

## 2017-11-11 ENCOUNTER — Telehealth: Payer: Self-pay | Admitting: *Deleted

## 2017-11-11 DIAGNOSIS — I129 Hypertensive chronic kidney disease with stage 1 through stage 4 chronic kidney disease, or unspecified chronic kidney disease: Secondary | ICD-10-CM | POA: Diagnosis not present

## 2017-11-11 DIAGNOSIS — N183 Chronic kidney disease, stage 3 (moderate): Secondary | ICD-10-CM | POA: Diagnosis not present

## 2017-11-11 DIAGNOSIS — I2510 Atherosclerotic heart disease of native coronary artery without angina pectoris: Secondary | ICD-10-CM | POA: Diagnosis not present

## 2017-11-11 DIAGNOSIS — J449 Chronic obstructive pulmonary disease, unspecified: Secondary | ICD-10-CM | POA: Diagnosis not present

## 2017-11-11 DIAGNOSIS — R944 Abnormal results of kidney function studies: Secondary | ICD-10-CM

## 2017-11-11 DIAGNOSIS — R7989 Other specified abnormal findings of blood chemistry: Secondary | ICD-10-CM

## 2017-11-11 DIAGNOSIS — M545 Low back pain: Secondary | ICD-10-CM | POA: Diagnosis not present

## 2017-11-11 DIAGNOSIS — S32019D Unspecified fracture of first lumbar vertebra, subsequent encounter for fracture with routine healing: Secondary | ICD-10-CM | POA: Diagnosis not present

## 2017-11-11 NOTE — Telephone Encounter (Signed)
New referral received from Dr. Estanislado Pandy for abnormal IFE. Spoke to the pt to schedule an appt. She was confused as to why she was being referred to see a hematologist. I messaged the referring office to please call the pt to explain the reason for the referral. Pt has been scheduled to see Dr. Alen Blew on 8/9 at 2pm. Pt aware to arrive 30 minutes early. Letter mailed.

## 2017-11-11 NOTE — Telephone Encounter (Signed)
Ok to refer.

## 2017-11-11 NOTE — Telephone Encounter (Signed)
Patient states she is under the understanding that we are going to refer her to the nephrologist,. Okay to make referral.

## 2017-11-12 DIAGNOSIS — S32019D Unspecified fracture of first lumbar vertebra, subsequent encounter for fracture with routine healing: Secondary | ICD-10-CM | POA: Diagnosis not present

## 2017-11-12 DIAGNOSIS — I4891 Unspecified atrial fibrillation: Secondary | ICD-10-CM | POA: Diagnosis not present

## 2017-11-12 DIAGNOSIS — M353 Polymyalgia rheumatica: Secondary | ICD-10-CM | POA: Diagnosis not present

## 2017-11-12 DIAGNOSIS — I2510 Atherosclerotic heart disease of native coronary artery without angina pectoris: Secondary | ICD-10-CM | POA: Diagnosis not present

## 2017-11-12 DIAGNOSIS — N183 Chronic kidney disease, stage 3 (moderate): Secondary | ICD-10-CM | POA: Diagnosis not present

## 2017-11-12 DIAGNOSIS — I129 Hypertensive chronic kidney disease with stage 1 through stage 4 chronic kidney disease, or unspecified chronic kidney disease: Secondary | ICD-10-CM | POA: Diagnosis not present

## 2017-11-12 DIAGNOSIS — J449 Chronic obstructive pulmonary disease, unspecified: Secondary | ICD-10-CM | POA: Diagnosis not present

## 2017-11-12 DIAGNOSIS — M545 Low back pain: Secondary | ICD-10-CM | POA: Diagnosis not present

## 2017-11-12 NOTE — Telephone Encounter (Signed)
Patient advised the referral has been placed.

## 2017-11-14 DIAGNOSIS — M545 Low back pain: Secondary | ICD-10-CM | POA: Diagnosis not present

## 2017-11-14 DIAGNOSIS — N183 Chronic kidney disease, stage 3 (moderate): Secondary | ICD-10-CM | POA: Diagnosis not present

## 2017-11-14 DIAGNOSIS — J449 Chronic obstructive pulmonary disease, unspecified: Secondary | ICD-10-CM | POA: Diagnosis not present

## 2017-11-14 DIAGNOSIS — S32019D Unspecified fracture of first lumbar vertebra, subsequent encounter for fracture with routine healing: Secondary | ICD-10-CM | POA: Diagnosis not present

## 2017-11-14 DIAGNOSIS — I2510 Atherosclerotic heart disease of native coronary artery without angina pectoris: Secondary | ICD-10-CM | POA: Diagnosis not present

## 2017-11-14 DIAGNOSIS — I129 Hypertensive chronic kidney disease with stage 1 through stage 4 chronic kidney disease, or unspecified chronic kidney disease: Secondary | ICD-10-CM | POA: Diagnosis not present

## 2017-11-17 DIAGNOSIS — S32019D Unspecified fracture of first lumbar vertebra, subsequent encounter for fracture with routine healing: Secondary | ICD-10-CM | POA: Diagnosis not present

## 2017-11-17 DIAGNOSIS — I129 Hypertensive chronic kidney disease with stage 1 through stage 4 chronic kidney disease, or unspecified chronic kidney disease: Secondary | ICD-10-CM | POA: Diagnosis not present

## 2017-11-17 DIAGNOSIS — M545 Low back pain: Secondary | ICD-10-CM | POA: Diagnosis not present

## 2017-11-17 DIAGNOSIS — J449 Chronic obstructive pulmonary disease, unspecified: Secondary | ICD-10-CM | POA: Diagnosis not present

## 2017-11-17 DIAGNOSIS — N183 Chronic kidney disease, stage 3 (moderate): Secondary | ICD-10-CM | POA: Diagnosis not present

## 2017-11-17 DIAGNOSIS — I2510 Atherosclerotic heart disease of native coronary artery without angina pectoris: Secondary | ICD-10-CM | POA: Diagnosis not present

## 2017-11-18 ENCOUNTER — Telehealth: Payer: Self-pay | Admitting: Rheumatology

## 2017-11-18 DIAGNOSIS — I2510 Atherosclerotic heart disease of native coronary artery without angina pectoris: Secondary | ICD-10-CM | POA: Diagnosis not present

## 2017-11-18 DIAGNOSIS — I129 Hypertensive chronic kidney disease with stage 1 through stage 4 chronic kidney disease, or unspecified chronic kidney disease: Secondary | ICD-10-CM | POA: Diagnosis not present

## 2017-11-18 DIAGNOSIS — N183 Chronic kidney disease, stage 3 (moderate): Secondary | ICD-10-CM | POA: Diagnosis not present

## 2017-11-18 DIAGNOSIS — S32019D Unspecified fracture of first lumbar vertebra, subsequent encounter for fracture with routine healing: Secondary | ICD-10-CM | POA: Diagnosis not present

## 2017-11-18 DIAGNOSIS — J449 Chronic obstructive pulmonary disease, unspecified: Secondary | ICD-10-CM | POA: Diagnosis not present

## 2017-11-18 DIAGNOSIS — M545 Low back pain: Secondary | ICD-10-CM | POA: Diagnosis not present

## 2017-11-18 MED ORDER — PREDNISONE 1 MG PO TABS: 4.0000 mg | ORAL_TABLET | Freq: Every day | ORAL | 0 refills | Status: DC

## 2017-11-18 NOTE — Telephone Encounter (Signed)
Last visit: 11/03/17 Next visit: 12/05/17  Patient is on 4 mg of Prednisone.

## 2017-11-18 NOTE — Telephone Encounter (Signed)
Patient left a voicemail requesting a prescription refill of Prednisone.

## 2017-11-19 DIAGNOSIS — J449 Chronic obstructive pulmonary disease, unspecified: Secondary | ICD-10-CM | POA: Diagnosis not present

## 2017-11-19 DIAGNOSIS — I129 Hypertensive chronic kidney disease with stage 1 through stage 4 chronic kidney disease, or unspecified chronic kidney disease: Secondary | ICD-10-CM | POA: Diagnosis not present

## 2017-11-19 DIAGNOSIS — N183 Chronic kidney disease, stage 3 (moderate): Secondary | ICD-10-CM | POA: Diagnosis not present

## 2017-11-19 DIAGNOSIS — I2510 Atherosclerotic heart disease of native coronary artery without angina pectoris: Secondary | ICD-10-CM | POA: Diagnosis not present

## 2017-11-19 DIAGNOSIS — M545 Low back pain: Secondary | ICD-10-CM | POA: Diagnosis not present

## 2017-11-19 DIAGNOSIS — S32019D Unspecified fracture of first lumbar vertebra, subsequent encounter for fracture with routine healing: Secondary | ICD-10-CM | POA: Diagnosis not present

## 2017-11-20 DIAGNOSIS — J449 Chronic obstructive pulmonary disease, unspecified: Secondary | ICD-10-CM | POA: Diagnosis not present

## 2017-11-20 DIAGNOSIS — S32019D Unspecified fracture of first lumbar vertebra, subsequent encounter for fracture with routine healing: Secondary | ICD-10-CM | POA: Diagnosis not present

## 2017-11-20 DIAGNOSIS — I2510 Atherosclerotic heart disease of native coronary artery without angina pectoris: Secondary | ICD-10-CM | POA: Diagnosis not present

## 2017-11-20 DIAGNOSIS — M545 Low back pain: Secondary | ICD-10-CM | POA: Diagnosis not present

## 2017-11-20 DIAGNOSIS — N183 Chronic kidney disease, stage 3 (moderate): Secondary | ICD-10-CM | POA: Diagnosis not present

## 2017-11-20 DIAGNOSIS — I129 Hypertensive chronic kidney disease with stage 1 through stage 4 chronic kidney disease, or unspecified chronic kidney disease: Secondary | ICD-10-CM | POA: Diagnosis not present

## 2017-11-21 DIAGNOSIS — I2510 Atherosclerotic heart disease of native coronary artery without angina pectoris: Secondary | ICD-10-CM | POA: Diagnosis not present

## 2017-11-21 DIAGNOSIS — M545 Low back pain: Secondary | ICD-10-CM | POA: Diagnosis not present

## 2017-11-21 DIAGNOSIS — N183 Chronic kidney disease, stage 3 (moderate): Secondary | ICD-10-CM | POA: Diagnosis not present

## 2017-11-21 DIAGNOSIS — I129 Hypertensive chronic kidney disease with stage 1 through stage 4 chronic kidney disease, or unspecified chronic kidney disease: Secondary | ICD-10-CM | POA: Diagnosis not present

## 2017-11-21 DIAGNOSIS — J449 Chronic obstructive pulmonary disease, unspecified: Secondary | ICD-10-CM | POA: Diagnosis not present

## 2017-11-21 DIAGNOSIS — S32019D Unspecified fracture of first lumbar vertebra, subsequent encounter for fracture with routine healing: Secondary | ICD-10-CM | POA: Diagnosis not present

## 2017-11-24 DIAGNOSIS — I2510 Atherosclerotic heart disease of native coronary artery without angina pectoris: Secondary | ICD-10-CM | POA: Diagnosis not present

## 2017-11-24 DIAGNOSIS — S32019D Unspecified fracture of first lumbar vertebra, subsequent encounter for fracture with routine healing: Secondary | ICD-10-CM | POA: Diagnosis not present

## 2017-11-24 DIAGNOSIS — N183 Chronic kidney disease, stage 3 (moderate): Secondary | ICD-10-CM | POA: Diagnosis not present

## 2017-11-24 DIAGNOSIS — I129 Hypertensive chronic kidney disease with stage 1 through stage 4 chronic kidney disease, or unspecified chronic kidney disease: Secondary | ICD-10-CM | POA: Diagnosis not present

## 2017-11-24 DIAGNOSIS — M545 Low back pain: Secondary | ICD-10-CM | POA: Diagnosis not present

## 2017-11-24 DIAGNOSIS — J449 Chronic obstructive pulmonary disease, unspecified: Secondary | ICD-10-CM | POA: Diagnosis not present

## 2017-11-25 DIAGNOSIS — M545 Low back pain: Secondary | ICD-10-CM | POA: Diagnosis not present

## 2017-11-25 DIAGNOSIS — J449 Chronic obstructive pulmonary disease, unspecified: Secondary | ICD-10-CM | POA: Diagnosis not present

## 2017-11-25 DIAGNOSIS — N183 Chronic kidney disease, stage 3 (moderate): Secondary | ICD-10-CM | POA: Diagnosis not present

## 2017-11-25 DIAGNOSIS — I2510 Atherosclerotic heart disease of native coronary artery without angina pectoris: Secondary | ICD-10-CM | POA: Diagnosis not present

## 2017-11-25 DIAGNOSIS — S32019D Unspecified fracture of first lumbar vertebra, subsequent encounter for fracture with routine healing: Secondary | ICD-10-CM | POA: Diagnosis not present

## 2017-11-25 DIAGNOSIS — I129 Hypertensive chronic kidney disease with stage 1 through stage 4 chronic kidney disease, or unspecified chronic kidney disease: Secondary | ICD-10-CM | POA: Diagnosis not present

## 2017-11-26 DIAGNOSIS — S32019D Unspecified fracture of first lumbar vertebra, subsequent encounter for fracture with routine healing: Secondary | ICD-10-CM | POA: Diagnosis not present

## 2017-11-26 DIAGNOSIS — N183 Chronic kidney disease, stage 3 (moderate): Secondary | ICD-10-CM | POA: Diagnosis not present

## 2017-11-26 DIAGNOSIS — M545 Low back pain: Secondary | ICD-10-CM | POA: Diagnosis not present

## 2017-11-26 DIAGNOSIS — J449 Chronic obstructive pulmonary disease, unspecified: Secondary | ICD-10-CM | POA: Diagnosis not present

## 2017-11-26 DIAGNOSIS — I2510 Atherosclerotic heart disease of native coronary artery without angina pectoris: Secondary | ICD-10-CM | POA: Diagnosis not present

## 2017-11-26 DIAGNOSIS — I129 Hypertensive chronic kidney disease with stage 1 through stage 4 chronic kidney disease, or unspecified chronic kidney disease: Secondary | ICD-10-CM | POA: Diagnosis not present

## 2017-11-27 ENCOUNTER — Ambulatory Visit: Payer: Medicare Other | Admitting: Family Medicine

## 2017-11-27 NOTE — Progress Notes (Signed)
Office Visit Note  Patient: Brenda Hall             Date of Birth: 1930/07/09           MRN: [id number]             PCP: [name], MD Referring: [name], MD Visit Date: 12/05/2017 Occupation: @GUAROCC @  Subjective:  Fu on DXA  History of Present Illness: Brenda Hall is a 84 y.o. female with history of PMR, osteoarthritis, DDD.  Patient states that her lower back pain is better.  She states she was closing the car door and strained the muscles in her left side of her chest.  She still having discomfort in that area.  She has been on tapering dose of prednisone for polymyalgia.  She is currently on 3 mg p.o. daily.  She does have some osteoarthritis involving her hips and knee joints.  Her both knee joints are replaced.  Activities of Daily Living:  Patient reports morning stiffness for 0 none.   Patient Denies nocturnal pain.  Difficulty dressing/grooming: Denies Difficulty climbing stairs: Reports Difficulty getting out of chair: Denies Difficulty using hands for taps, buttons, cutlery, and/or writing: Denies  Review of Systems  Constitutional: Positive for fatigue.  HENT: Positive for mouth dryness. Negative for mouth sores and nose dryness.   Eyes: Negative for pain, visual disturbance and dryness.  Respiratory: Negative for cough, hemoptysis, shortness of breath and difficulty breathing.   Cardiovascular: Positive for swelling in legs/feet. Negative for chest pain, palpitations and hypertension.  Gastrointestinal: Negative for blood in stool, constipation and diarrhea.  Endocrine: Negative for increased urination.  Genitourinary: Negative for painful urination and involuntary urination.  Musculoskeletal: Positive for arthralgias, joint pain and joint swelling. Negative for myalgias, muscle weakness, morning stiffness, muscle tenderness and myalgias.  Skin: Negative for color change, pallor, rash, hair loss, nodules/bumps, skin tightness, ulcers  and sensitivity to sunlight.  Allergic/Immunologic: Negative for susceptible to infections.  Neurological: Negative for dizziness, numbness, headaches and weakness.  Hematological: Negative for bruising/bleeding tendency and swollen glands.  Psychiatric/Behavioral: Positive for sleep disturbance. Negative for depressed mood. The patient is not nervous/anxious.     PMFS History:  Patient Active Problem List   Diagnosis Date Noted  . Atrial fibrillation with RVR ([place]) 10/24/2017  . Nausea 10/24/2017  . Nausea 10/24/2017  . Acute bilateral low back pain without sciatica   . E. coli UTI   . Community acquired pneumonia   . L1 vertebral fracture ([place]) 09/27/2017  . UTI (urinary tract infection) 09/27/2017  . CKD (chronic kidney disease), stage III ([place])   . History of TIA (transient ischemic attack)   . PAF (paroxysmal atrial fibrillation) ([place])   . Atrial fibrillation with rapid ventricular response ([place]) 02/12/2017  . Hyperlipidemia 02/12/2017  . Pulmonary edema 02/12/2017  . Urgency of micturition 02/12/2017  . Osteopenia of multiple sites 07/23/2016  . Age-related osteoporosis without current pathological fracture 07/11/2016  . History of total knee replacement, bilateral 07/11/2016  . History of COPD 07/11/2016  . Spondylosis of lumbar region without myelopathy or radiculopathy 07/11/2016  . Primary osteoarthritis of both hands 07/11/2016  . Primary osteoarthritis of both feet 07/11/2016  . Vitamin D deficiency 07/11/2016  . History of vertebral fracture 07/11/2016  . History of IBS 07/11/2016  . Basal cell carcinoma 07/11/2016  . History of CVA (cerebrovascular accident)/ has metal implant not MRI safe  07/11/2016  . Other and unspecified hyperlipidemia 11/19/2012  .  Cerebrovascular accident (stroke) ([place]) 11/19/2012  . Difficulty in walking(719.7) 11/06/2012  . Occlusion and stenosis of carotid artery without mention of cerebral infarction 02/07/2012  . OLD MYOCARDIAL  INFARCTION 11/07/2009  . CAD, NATIVE VESSEL 11/07/2009  . Essential hypertension 09/28/2008  . IRRITABLE BOWEL SYNDROME 09/28/2008  . DEGENERATIVE JOINT DISEASE, KNEE 09/28/2008  . POLYMYALGIA RHEUMATICA 09/28/2008  . Chest pain, unspecified 09/28/2008    Past Medical History:  Diagnosis Date  . Anemia   . CAD in native artery    a. NSTEMI 12/2005 s/p BMS to Cx, prior normal LVEF.  Marland Kitchen Cancer ([place]) 1988 and  2009   BCC nose and  Melanoma back  . Cancer of skin Jan. 2014   [facility]  . Carotid stenosis    mild  . Chronic back pain   . CKD (chronic kidney disease), stage III ([place])   . Coronary atherosclerosis of unspecified type of vessel, native or graft   . DJD (degenerative joint disease) of knee   . History of TIA (transient ischemic attack)   . HTN (hypertension)   . IBS (irritable bowel syndrome)   . Myocardial infarction [facility])    5 yrs ago     dr wall  cardiac  . Osteoporosis   . Other diseases of lung, not elsewhere classified    lung nodule  . Polymyalgia rheumatica ([place])   . Shingles 01/2016-02/2016  . Stroke [facility])     Family History  Problem Relation Age of Onset  . Heart disease Father   . Hypertension Father   . Heart attack Father   . Cancer Mother        bladder   . Hypertension Daughter   . Atrial fibrillation Daughter   . Hypertension Son   . Colon cancer Neg Hx    Past Surgical History:  Procedure Laterality Date  . ABDOMINAL HYSTERECTOMY  1977   partial  . BACK SURGERY  1982  . CARDIAC CATHETERIZATION    . COLONOSCOPY N/A 01/27/2013   Procedure: COLONOSCOPY;  Surgeon: Rogene Houston, MD;  [facility];  Service: Endoscopy;  Laterality: N/A;  1030  . EYE SURGERY Bilateral 04/2017  . EYE SURGERY Right 09/23/2017  . IR KYPHO LUMBAR INC FX REDUCE BONE BX UNI/BIL CANNULATION INC/IMAGING  09/30/2017  . IR VERTEBROPLASTY CERV/THOR BX INC UNI/BIL INC/INJECT/IMAGING  09/05/2017  . JOINT REPLACEMENT  2001 and  2011   6 knee  surgeries   bil replacement  . KNEE ARTHROPLASTY    . KNEE ARTHROSCOPY    . LUMBAR LAMINECTOMY    . PR VEIN BYPASS GRAFT,AORTO-FEM-POP  2010  . removal of melanoma from back  2007  . SPINE SURGERY  2013   Broken back: fusion surgery  . tonisllectomy  1944  . total knee anthroplasty    . VERTEBROPLASTY  07/11/2011   Procedure: VERTEBROPLASTY;  Surgeon: Faythe Ghee, MD;  Location: [place] NEURO ORS;  Service: Neurosurgery;  Laterality: N/A;  Vertebroplasty of Lumbar Two   Social History   Social History Narrative   Married, retired. 3 children. Regular exercise -yes; hobbies = swimming.    Objective: Vital Signs: BP (!) 153/66 (BP Location: Left Arm, Patient Position: Sitting, Cuff Size: Normal)   Pulse 68   Resp 18   Ht 5\' 3"  (1.6 m)   Wt 153 lb 9.6 oz (69.7 kg)   BMI 27.21 kg/m    Physical Exam  Constitutional: She is oriented to person, place, and time.  She appears well-developed and well-nourished.  HENT:  Head: Normocephalic and atraumatic.  Eyes: Conjunctivae and EOM are normal.  Neck: Normal range of motion.  Cardiovascular: Normal rate, regular rhythm, normal heart sounds and intact distal pulses.  Pulmonary/Chest: Effort normal and breath sounds normal.  Abdominal: Soft. Bowel sounds are normal.  Lymphadenopathy:    She has no cervical adenopathy.  Neurological: She is alert and oriented to person, place, and time.  Skin: Skin is warm and dry. Capillary refill takes less than 2 seconds.  Psychiatric: She has a normal mood and affect. Her behavior is normal.  Nursing note and vitals reviewed.    Musculoskeletal Exam: C-spine good range of motion.  She has thoracic kyphosis.  Limited range of motion of thoracic and lumbar spine.  Bilateral knee joints have been replaced.  There is some DIP PIP thickening in her hands and feet.   CDAI Exam: CDAI Score: Not documented Patient Global Assessment: Not documented; Provider Global Assessment: Not documented Swollen: 0 ;  Tender: 0  Joint Exam   Not documented   There is currently no information documented on the homunculus. Go to the Rheumatology activity and complete the homunculus joint exam.  Investigation: Findings:  November 07, 2017 DEXA score showed T score of -2.0 in the left one third distal radius there was a change of -5% compared to her previous bone density of May 23, 2015   Imaging: No results found.  Recent Labs: Lab Results  Component Value Date   WBC 8.0 10/25/2017   HGB 11.6 (L) 10/25/2017   PLT 499 (H) 10/25/2017   NA 141 11/03/2017   K 4.4 11/03/2017   CL 101 11/03/2017   CO2 32 11/03/2017   GLUCOSE 99 11/03/2017   BUN 20 11/03/2017   CREATININE 1.38 (H) 11/03/2017   BILITOT 0.3 11/03/2017   ALKPHOS 68 10/26/2017   AST 21 11/03/2017   ALT 13 11/03/2017   PROT 6.6 11/03/2017   PROT 6.7 11/03/2017   ALBUMIN 2.8 (L) 10/26/2017   CALCIUM 9.7 11/03/2017   GFRAA 41 (L) 11/03/2017    Speciality Comments: No specialty comments available.  Procedures:  No procedures performed Allergies: Epinephrine; Clopidogrel bisulfate; and Oxycodone   Assessment / Plan:     Visit Diagnoses: Polymyalgia rheumatica (HCC)-patient appears to be in remission without any muscle weakness or muscle tenderness.  She has been tapering her prednisone gradually.  The plan is to taper prednisone by 1 mg every month.  On prednisone therapy - Prednisone 3 mg p.o. daily  History of vertebral fracture - Patient reports having 2 kyphoplasty this year due to vertebral fracture.  Lower back is better after kyphoplasty now.  Osteopenia-last most recent T score was -2.0.  There was a significant drop of -5% compared to her last bone density in her BMD from 2017.  Patient states that she has taken Fosamax in the past several years ago and could not tolerate due to GI side effects.  Considering that she has been on long-term prednisone and having recurrent vertebral fractures I would like to try Tymlos.   Detailed counseling was provided.  Patient was in agreement to proceed with Tymlos.  Prescription was sent in today.  Primary osteoarthritis of both hands-joint protection muscle strengthening was discussed.  Primary osteoarthritis of left hip-she is good range of motion in her hip joint.  History of total knee replacement, bilateral-doing well.  She does use walker for mobility.  Primary osteoarthritis of both feet-she has some discomfort  in her feet.  DDD (degenerative disc disease), lumbar-she has limited range of motion of lumbar spine.  Vitamin D deficiency-she takes calcium and vitamin D.  Basal cell carcinoma (BCC), unspecified site  Atrial fibrillation with rapid ventricular response (HCC) - On Xarelto  Essential hypertension  History of CVA (cerebrovascular accident)/ has metal implant not MRI safe   History of COPD  History of IBS   Orders: No orders of the defined types were placed in this encounter.  Meds ordered this encounter  Medications  . Abaloparatide (TYMLOS) 3120 MCG/1.56ML SOPN    Sig: Inject 80 mcg into the skin daily.    Dispense:  1 pen    Refill:  2    Face-to-face time spent with patient was 30 minutes. Greater than 50% of time was spent in counseling and coordination of care.  Follow-Up Instructions: Return in about 3 months (around 03/07/2018) for Polymyalgia Rheumatica, Osteoarthritis, DDD.   Bo Merino, MD  Note - This record has been created using Editor, commissioning.  Chart creation errors have been sought, but may not always  have been located. Such creation errors do not reflect on  the standard of medical care.

## 2017-11-28 ENCOUNTER — Inpatient Hospital Stay: Payer: Medicare Other | Attending: Oncology | Admitting: Oncology

## 2017-11-28 VITALS — BP 164/63 | HR 89 | Temp 98.0°F | Resp 18 | Ht 63.0 in | Wt 150.0 lb

## 2017-11-28 DIAGNOSIS — S32019D Unspecified fracture of first lumbar vertebra, subsequent encounter for fracture with routine healing: Secondary | ICD-10-CM | POA: Diagnosis not present

## 2017-11-28 DIAGNOSIS — M545 Low back pain: Secondary | ICD-10-CM | POA: Diagnosis not present

## 2017-11-28 DIAGNOSIS — M353 Polymyalgia rheumatica: Secondary | ICD-10-CM | POA: Diagnosis not present

## 2017-11-28 DIAGNOSIS — N289 Disorder of kidney and ureter, unspecified: Secondary | ICD-10-CM

## 2017-11-28 DIAGNOSIS — R748 Abnormal levels of other serum enzymes: Secondary | ICD-10-CM | POA: Diagnosis not present

## 2017-11-28 DIAGNOSIS — D649 Anemia, unspecified: Secondary | ICD-10-CM | POA: Diagnosis not present

## 2017-11-28 DIAGNOSIS — R778 Other specified abnormalities of plasma proteins: Secondary | ICD-10-CM

## 2017-11-28 DIAGNOSIS — I2510 Atherosclerotic heart disease of native coronary artery without angina pectoris: Secondary | ICD-10-CM | POA: Diagnosis not present

## 2017-11-28 DIAGNOSIS — J449 Chronic obstructive pulmonary disease, unspecified: Secondary | ICD-10-CM | POA: Diagnosis not present

## 2017-11-28 DIAGNOSIS — I129 Hypertensive chronic kidney disease with stage 1 through stage 4 chronic kidney disease, or unspecified chronic kidney disease: Secondary | ICD-10-CM | POA: Diagnosis not present

## 2017-11-28 DIAGNOSIS — N183 Chronic kidney disease, stage 3 (moderate): Secondary | ICD-10-CM | POA: Diagnosis not present

## 2017-11-28 NOTE — Progress Notes (Signed)
Reason for the request:   Evaluation for plasma cell disorder.  HPI: I was asked by Dr. Estanislado Pandy to evaluate Brenda Hall for abnormal serum protein electrophoresis.  She is a pleasant 84 year old woman with history of coronary artery disease, hypertension as well as osteoporosis.  She was evaluated by Dr. Estanislado Pandy for polymyalgia rheumatica and fatigue.  In July 2019 she had laboratory testing including a serum protein electrophoresis which showed a faint abnormal protein detected in the gammaglobulin without any evidence of monoclonal immunoglobin elevation.  Her electrolytes showed normal potassium, calcium and sodium.  Her creatinine was 1.38 with normal total protein.  Her CBC showed a hemoglobin of 11.6 with mildly elevated platelet count of 499 and normal differential.  Clinically, she does report generalized weakness predominantly associated with ambulation.  She does use a walker to support her ambulation.  She denies any recent falls or syncope but has had multiple surgeries and feels that her core strength has declined.  She is currently on tapering doses of prednisone of 4 mg for a recent PMR relapse.  She does not report any headaches, blurry vision, syncope or seizures. Does not report any fevers, chills or sweats.  Does not report any cough, wheezing or hemoptysis.  Does not report any chest pain, palpitation, orthopnea or leg edema.  Does not report any nausea, vomiting or abdominal pain.  Does not report any constipation or diarrhea.  Does not report any skeletal complaints.    Does not report frequency, urgency or hematuria.  Does not report any skin rashes or lesions. Does not report any heat or cold intolerance.  Does not report any lymphadenopathy or petechiae.  Does not report any anxiety or depression.  Remaining review of systems is negative.    Past Medical History:  Diagnosis Date  . Anemia   . CAD in native artery    a. NSTEMI 12/2005 s/p BMS to Cx, prior normal LVEF.   Marland Kitchen Cancer ([place]) 1988 and  2009   BCC nose and  Melanoma back  . Cancer of skin Jan. 2014   [facility] Forehead and Nose  . Carotid stenosis    mild  . Chronic back pain   . CKD (chronic kidney disease), stage III ([place])   . Coronary atherosclerosis of unspecified type of vessel, native or graft   . DJD (degenerative joint disease) of knee   . History of TIA (transient ischemic attack)   . HTN (hypertension)   . IBS (irritable bowel syndrome)   . Myocardial infarction [facility])    5 yrs ago     dr wall  cardiac  . Osteoporosis   . Other diseases of lung, not elsewhere classified    lung nodule  . Polymyalgia rheumatica ([place])   . Shingles 01/2016-02/2016  . Stroke [facility])   :  Past Surgical History:  Procedure Laterality Date  . ABDOMINAL HYSTERECTOMY  1977   partial  . BACK SURGERY  1982  . CARDIAC CATHETERIZATION    . COLONOSCOPY N/A 01/27/2013   Procedure: COLONOSCOPY;  Surgeon: Rogene Houston, MD;  [facility];  Service: Endoscopy;  Laterality: N/A;  1030  . EYE SURGERY Bilateral 04/2017  . EYE SURGERY Right 09/23/2017  . IR KYPHO LUMBAR INC FX REDUCE BONE BX UNI/BIL CANNULATION INC/IMAGING  09/30/2017  . IR VERTEBROPLASTY CERV/THOR BX INC UNI/BIL INC/INJECT/IMAGING  09/05/2017  . JOINT REPLACEMENT  2001 and  2011   6 knee surgeries   bil replacement  . KNEE ARTHROSCOPY    .  LUMBAR LAMINECTOMY    . PR VEIN BYPASS GRAFT,AORTO-FEM-POP  2010  . removal of melanoma from back  2007  . SPINE SURGERY  2013   Broken back: fusion surgery  . tonisllectomy  1944  . total knee anthroplasty    . VERTEBROPLASTY  07/11/2011   Procedure: VERTEBROPLASTY;  Surgeon: Faythe Ghee, MD;  Location: [place] NEURO ORS;  Service: Neurosurgery;  Laterality: N/A;  Vertebroplasty of Lumbar Two  :   Current Outpatient Medications:  .  acetaminophen (TYLENOL) 500 MG tablet, Take 500 mg by mouth every 6 (six) hours as needed for mild pain., Disp: , Rfl:  .  amLODipine (NORVASC) 5 MG tablet, Take 5 mg  by mouth daily., Disp: , Rfl:  .  aspirin 81 MG tablet, Take 81 mg by mouth daily., Disp: , Rfl:  .  atorvastatin (LIPITOR) 80 MG tablet, Take 1 tablet (80 mg total) by mouth at bedtime., Disp: 90 tablet, Rfl: 1 .  Calcium Carbonate-Vit D-Min (CALCIUM 1200 PO), Take 1 tablet by mouth at bedtime. , Disp: , Rfl:  .  estrogens, conjugated, (PREMARIN) 0.3 MG tablet, Take 1 tablet (0.3 mg total) by mouth every evening., Disp: , Rfl:  .  fluticasone (FLONASE) 50 MCG/ACT nasal spray, USE TWO SPRAYS IN EACH NOSTRIL ONCE DAILY (Patient taking differently: Place 1 spray into both nostrils daily as needed for allergies. USE TWO SPRAYS IN EACH NOSTRIL ONCE DAILY), Disp: 16 g, Rfl: 5 .  furosemide (LASIX) 20 MG tablet, Take 20 mg DAILY AS NEEDED FOR SWELLING. (Patient not taking: Reported on 11/03/2017), Disp: 90 tablet, Rfl: 3 .  gabapentin (NEURONTIN) 100 MG capsule, Take 1 capsule (100 mg total) by mouth at bedtime. (Patient not taking: Reported on 11/03/2017), Disp: 30 capsule, Rfl: 2 .  lidocaine (LIDODERM) 5 %, Place 1 patch onto the skin daily. Remove & Discard patch within 12 hours or as directed by MD (Patient not taking: Reported on 11/03/2017), Disp: 20 patch, Rfl: 0 .  loperamide (IMODIUM) 2 MG capsule, Take 2 mg by mouth daily as needed for diarrhea or loose stools. , Disp: , Rfl:  .  methocarbamol (ROBAXIN) 500 MG tablet, Take 2 tablets (1,000 mg total) by mouth every 8 (eight) hours as needed for muscle spasms. (Patient not taking: Reported on 11/03/2017), Disp: 20 tablet, Rfl: 0 .  metoprolol succinate (TOPROL-XL) 50 MG 24 hr tablet, Take 1 and 1/2 Tablets At Night (Patient taking differently: Take 75 mg by mouth every evening. Take 1 and 1/2 Tablets At Night), Disp: 135 tablet, Rfl: 3 .  nitroGLYCERIN (NITROSTAT) 0.4 MG SL tablet, Place 1 tablet (0.4 mg total) under the tongue every 5 (five) minutes as needed for chest pain., Disp: 30 tablet, Rfl: 0 .  ondansetron (ZOFRAN ODT) 4 MG disintegrating  tablet, Take 1 tablet (4 mg total) by mouth every 8 (eight) hours as needed for nausea or vomiting., Disp: 30 tablet, Rfl: 2 .  pantoprazole (PROTONIX) 40 MG tablet, TAKE 1 TABLET EVERY MORNING, Disp: 90 tablet, Rfl: 1 .  predniSONE (DELTASONE) 1 MG tablet, Take 4 tablets (4 mg total) by mouth daily with breakfast., Disp: 120 tablet, Rfl: 0 .  predniSONE (DELTASONE) 5 MG tablet, Take 5 mg by mouth daily with breakfast., Disp: , Rfl:  .  senna-docusate (SENOKOT-S) 8.6-50 MG tablet, Take 1 tablet by mouth at bedtime as needed for mild constipation. (Patient not taking: Reported on 11/03/2017), Disp: , Rfl:  .  traMADol (ULTRAM) 50 MG tablet, Take 1-2  tablets (50-100 mg total) by mouth every 6 (six) hours as needed for moderate pain. (Patient not taking: Reported on 11/03/2017), Disp: 20 tablet, Rfl: 0 .  XARELTO 15 MG TABS tablet, TAKE 1 TABLET DAILY WITH SUPPER, Disp: 90 tablet, Rfl: 1 .  Zinc 50 MG CAPS, Take 50 mg by mouth daily., Disp: , Rfl: :  Allergies  Allergen Reactions  . Epinephrine Other (See Comments)    During dental procedure-caused facial swelling at injection site. Pt has had epinephrine since then for nose and facial surgery and did fine with it per pt.  . Clopidogrel Bisulfate Itching and Rash    Plavix  . Oxycodone     Hallucinations. Pt states that she can tolerate hydrocodone  :  Family History  Problem Relation Age of Onset  . Heart disease Father   . Hypertension Father   . Heart attack Father   . Cancer Mother        bladder   . Hypertension Daughter   . Atrial fibrillation Daughter   . Hypertension Son   . Colon cancer Neg Hx   :  Social History   Socioeconomic History  . Marital status: Widowed    Spouse name: Not on file  . Number of children: Not on file  . Years of education: Not on file  . Highest education level: Not on file  Occupational History  . Not on file  Social Needs  . Financial resource strain: Not on file  . Food insecurity:     Worry: Not on file    Inability: Not on file  . Transportation needs:    Medical: Not on file    Non-medical: Not on file  Tobacco Use  . Smoking status: Never Smoker  . Smokeless tobacco: Never Used  . Tobacco comment: tobacco use - no  Substance and Sexual Activity  . Alcohol use: Yes    Alcohol/week: 0.0 standard drinks    Comment: occasionally a glass of wine or margarita per pt  . Drug use: No  . Sexual activity: Not on file  Lifestyle  . Physical activity:    Days per week: Not on file    Minutes per session: Not on file  . Stress: Not on file  Relationships  . Social connections:    Talks on phone: Not on file    Gets together: Not on file    Attends religious service: Not on file    Active member of club or organization: Not on file    Attends meetings of clubs or organizations: Not on file    Relationship status: Not on file  . Intimate partner violence:    Fear of current or ex partner: Not on file    Emotionally abused: Not on file    Physically abused: Not on file    Forced sexual activity: Not on file  Other Topics Concern  . Not on file  Social History Narrative   Married, retired. 3 children. Regular exercise -yes; hobbies = swimming.  :  Pertinent items are noted in HPI.  Exam: Blood pressure (!) 164/63, pulse 89, temperature 98 F (36.7 C), temperature source Oral, resp. rate 18, height 5\' 3"  (1.6 m), weight 150 lb (68 kg), SpO2 95 %. ECOG 1  General appearance: alert and cooperative appeared without distress. Head: atraumatic without any abnormalities. Eyes: conjunctivae/corneas clear. PERRL.  Sclera anicteric. Throat: lips, mucosa, and tongue normal; without oral thrush or ulcers. Resp: clear to auscultation bilaterally without rhonchi,  wheezes or dullness to percussion. Cardio: regular rate and rhythm, S1, S2 normal, no murmur, click, rub or gallop GI: soft, non-tender; bowel sounds normal; no masses,  no organomegaly Skin: Skin color, texture,  turgor normal. No rashes or lesions Lymph nodes: Cervical, supraclavicular, and axillary nodes normal. Neurologic: Grossly normal without any motor, sensory or deep tendon reflexes. Musculoskeletal: No joint deformity or effusion.  CBC    Component Value Date/Time   WBC 8.0 10/25/2017 0702   RBC 4.13 10/25/2017 0702   HGB 11.6 (L) 10/25/2017 0702   HGB 12.2 02/10/2016 0843   HCT 38.5 10/25/2017 0702   HCT 36.1 02/10/2016 0843   PLT 499 (H) 10/25/2017 0702   PLT 283 02/10/2016 0843   MCV 93.2 10/25/2017 0702   MCV 97 02/10/2016 0843   MCH 28.1 10/25/2017 0702   MCHC 30.1 10/25/2017 0702   RDW 14.2 10/25/2017 0702   RDW 13.8 02/10/2016 0843   LYMPHSABS 1.6 10/25/2017 0702   LYMPHSABS 2.1 02/10/2016 0843   MONOABS 1.0 10/25/2017 0702   EOSABS 0.1 10/25/2017 0702   EOSABS 0.1 02/10/2016 0843   BASOSABS 0.1 10/25/2017 0702   BASOSABS 0.1 02/10/2016 0843     Chemistry      Component Value Date/Time   NA 141 11/03/2017 1228   NA 144 01/14/2017 1031   K 4.4 11/03/2017 1228   CL 101 11/03/2017 1228   CO2 32 11/03/2017 1228   BUN 20 11/03/2017 1228   BUN 19 01/14/2017 1031   CREATININE 1.38 (H) 11/03/2017 1228      Component Value Date/Time   CALCIUM 9.7 11/03/2017 1228   ALKPHOS 68 10/26/2017 0647   AST 21 11/03/2017 1228   ALT 13 11/03/2017 1228   BILITOT 0.3 11/03/2017 1228   BILITOT 0.4 05/31/2017 0858       Assessment and Plan:   84 year old woman with the following:  1.  Abnormal serum protein electrophoresis: This was detected in July 2019 as a part of a rheumatological work-up in the setting of polymyalgia rheumatica.  Her serum protein electrophoresis panel did not show any monoclonal protein and rather abnormal faint protein band consistent with chronic inflammation.  These findings discussed today with the patient in detail as well as the differential diagnosis.  Reactive abnormalities and serum protein electrophoresis are likely the etiology without any  evidence to suggest a plasma cell disorder.  Her presentation, laboratory data as well as her serum protein electrophoresis do not suggest conditions such as MGUS, myeloma or amyloidosis.  I do not recommend any further work-up or evaluation for man hematology standpoint.  2.  Renal insufficiency: Unlikely related to a plasma cell disorder.  Nephrology evaluation is currently ongoing.  3.  Anemia: Very mild and likely associated with her renal insufficiency.  Does not require any further work-up or intervention.  4.  Follow-up: I am happy to see her in the future as needed.  30  minutes was spent with the patient face-to-face today.  More than 50% of time was dedicated to addressing her laboratory finding, differential diagnosis and management options.  Thank you for the referral.  A copy of this consult has been forwarded to the requesting physician.

## 2017-11-29 DIAGNOSIS — I129 Hypertensive chronic kidney disease with stage 1 through stage 4 chronic kidney disease, or unspecified chronic kidney disease: Secondary | ICD-10-CM | POA: Diagnosis not present

## 2017-11-29 DIAGNOSIS — S32019D Unspecified fracture of first lumbar vertebra, subsequent encounter for fracture with routine healing: Secondary | ICD-10-CM | POA: Diagnosis not present

## 2017-11-29 DIAGNOSIS — I2510 Atherosclerotic heart disease of native coronary artery without angina pectoris: Secondary | ICD-10-CM | POA: Diagnosis not present

## 2017-11-29 DIAGNOSIS — M545 Low back pain: Secondary | ICD-10-CM | POA: Diagnosis not present

## 2017-11-29 DIAGNOSIS — J449 Chronic obstructive pulmonary disease, unspecified: Secondary | ICD-10-CM | POA: Diagnosis not present

## 2017-11-29 DIAGNOSIS — N183 Chronic kidney disease, stage 3 (moderate): Secondary | ICD-10-CM | POA: Diagnosis not present

## 2017-12-01 ENCOUNTER — Telehealth: Payer: Self-pay

## 2017-12-01 DIAGNOSIS — S32019D Unspecified fracture of first lumbar vertebra, subsequent encounter for fracture with routine healing: Secondary | ICD-10-CM | POA: Diagnosis not present

## 2017-12-01 DIAGNOSIS — I129 Hypertensive chronic kidney disease with stage 1 through stage 4 chronic kidney disease, or unspecified chronic kidney disease: Secondary | ICD-10-CM | POA: Diagnosis not present

## 2017-12-01 DIAGNOSIS — I2510 Atherosclerotic heart disease of native coronary artery without angina pectoris: Secondary | ICD-10-CM | POA: Diagnosis not present

## 2017-12-01 DIAGNOSIS — M545 Low back pain: Secondary | ICD-10-CM | POA: Diagnosis not present

## 2017-12-01 DIAGNOSIS — N183 Chronic kidney disease, stage 3 (moderate): Secondary | ICD-10-CM | POA: Diagnosis not present

## 2017-12-01 DIAGNOSIS — J449 Chronic obstructive pulmonary disease, unspecified: Secondary | ICD-10-CM | POA: Diagnosis not present

## 2017-12-01 NOTE — Telephone Encounter (Signed)
Per 8/9 no los 

## 2017-12-02 DIAGNOSIS — I129 Hypertensive chronic kidney disease with stage 1 through stage 4 chronic kidney disease, or unspecified chronic kidney disease: Secondary | ICD-10-CM | POA: Diagnosis not present

## 2017-12-02 DIAGNOSIS — S32019D Unspecified fracture of first lumbar vertebra, subsequent encounter for fracture with routine healing: Secondary | ICD-10-CM | POA: Diagnosis not present

## 2017-12-02 DIAGNOSIS — I2510 Atherosclerotic heart disease of native coronary artery without angina pectoris: Secondary | ICD-10-CM | POA: Diagnosis not present

## 2017-12-02 DIAGNOSIS — J449 Chronic obstructive pulmonary disease, unspecified: Secondary | ICD-10-CM | POA: Diagnosis not present

## 2017-12-02 DIAGNOSIS — M545 Low back pain: Secondary | ICD-10-CM | POA: Diagnosis not present

## 2017-12-02 DIAGNOSIS — N183 Chronic kidney disease, stage 3 (moderate): Secondary | ICD-10-CM | POA: Diagnosis not present

## 2017-12-03 ENCOUNTER — Ambulatory Visit (INDEPENDENT_AMBULATORY_CARE_PROVIDER_SITE_OTHER): Payer: Medicare Other | Admitting: Cardiology

## 2017-12-03 ENCOUNTER — Encounter: Payer: Self-pay | Admitting: Cardiology

## 2017-12-03 VITALS — BP 122/72 | HR 74 | Ht 63.0 in | Wt 151.2 lb

## 2017-12-03 DIAGNOSIS — I2510 Atherosclerotic heart disease of native coronary artery without angina pectoris: Secondary | ICD-10-CM

## 2017-12-03 DIAGNOSIS — I480 Paroxysmal atrial fibrillation: Secondary | ICD-10-CM

## 2017-12-03 DIAGNOSIS — E782 Mixed hyperlipidemia: Secondary | ICD-10-CM

## 2017-12-03 DIAGNOSIS — I10 Essential (primary) hypertension: Secondary | ICD-10-CM

## 2017-12-03 DIAGNOSIS — I4891 Unspecified atrial fibrillation: Secondary | ICD-10-CM

## 2017-12-03 NOTE — Progress Notes (Signed)
Clinical Summary Brenda Hall is a 84 y.o.female  seen today for follow up of the following medical problems.   1. CAD - prior NSTEMI 12/31/2005, she had a BMS placed to LCX. LVEF at that time 52% - echo 03/2008 LVEF normal, no LVEF given  - lisinopril stopped due to hyperkalemia -denies any recent chest pain, no SOB.   2. Hx of TIA - no recent symptoms - compliant with meds.    3, Hyperlipidemia - 05/2017 TC 163 TG 103 HDL 75 LDL 67 - she is compliant with statin.   4. HTN - norvasc 10mg  caused dizziness, she is back on 5mg  daily - lopressor at 100mg  caused fatigue. Change to Toprol XL 75mg  dailyand tolerating well  - compliant with meds  5. CKDIII - followed at [facility].   6. Afib - new diagnosis during 01/2017 admissoin - CHADS2Vasc score is 7, started on xarelto  - admit 10/2017 with afib with RVR in setting of N/V, severe neck and back pain. From notes difficultly keeping pills down at that time.    7. LE edema -chronic intermittent LE edema. She has prn lasix. Multiple knee surgeries, suspect primary component is venous insufficiency. Echo with normal LVEF, only mild diastolic dysfunction   8. Vertebral fracture - admission 09/2017 with verterbal fracture. Also with pneumonia that admission.    9. Carotid stenos - mild stenosis    SH: former underwater photographer/scuba diver x 25 years.Husband by second marriage was a general in the [employer], had been in [employer] stationed in [place]. Brenda Hall.   Past Medical History:  Diagnosis Date  . Anemia   . CAD in native artery    a. NSTEMI 12/2005 s/p BMS to Cx, prior normal LVEF.  Marland Kitchen Cancer ([place]) 1988 and  2009   BCC nose and  Melanoma back  . Cancer of skin Jan. 2014   [facility] Forehead and Nose  . Carotid stenosis    mild  . Chronic back pain   . CKD (chronic kidney disease), stage III ([place])   . Coronary atherosclerosis of unspecified type of vessel, native or graft    . DJD (degenerative joint disease) of knee   . History of TIA (transient ischemic attack)   . HTN (hypertension)   . IBS (irritable bowel syndrome)   . Myocardial infarction [facility])    5 yrs ago     dr wall  cardiac  . Osteoporosis   . Other diseases of lung, not elsewhere classified    lung nodule  . Polymyalgia rheumatica ([place])   . Shingles 01/2016-02/2016  . Stroke [facility])      Allergies  Allergen Reactions  . Epinephrine Other (See Comments)    During dental procedure-caused facial swelling at injection site. Pt has had epinephrine since then for nose and facial surgery and did fine with it per pt.  . Clopidogrel Bisulfate Itching and Rash    Plavix  . Oxycodone     Hallucinations. Pt states that she can tolerate hydrocodone     Current Outpatient Medications  Medication Sig Dispense Refill  . acetaminophen (TYLENOL) 500 MG tablet Take 500 mg by mouth every 6 (six) hours as needed for mild pain.    Marland Kitchen amLODipine (NORVASC) 5 MG tablet Take 5 mg by mouth daily.    Marland Kitchen aspirin 81 MG tablet Take 81 mg by mouth daily.    Marland Kitchen atorvastatin (LIPITOR) 80 MG tablet Take 1 tablet (80 mg total) by  mouth at bedtime. 90 tablet 1  . Calcium Carbonate-Vit D-Min (CALCIUM 1200 PO) Take 1 tablet by mouth at bedtime.     Marland Kitchen estrogens, conjugated, (PREMARIN) 0.3 MG tablet Take 1 tablet (0.3 mg total) by mouth every evening.    . fluticasone (FLONASE) 50 MCG/ACT nasal spray USE TWO SPRAYS IN EACH NOSTRIL ONCE DAILY (Patient taking differently: Place 1 spray into both nostrils daily as needed for allergies. USE TWO SPRAYS IN EACH NOSTRIL ONCE DAILY) 16 g 5  . furosemide (LASIX) 20 MG tablet Take 20 mg DAILY AS NEEDED FOR SWELLING. 90 tablet 3  . lidocaine (LIDODERM) 5 % Place 1 patch onto the skin daily. Remove & Discard patch within 12 hours or as directed by MD 20 patch 0  . loperamide (IMODIUM) 2 MG capsule Take 2 mg by mouth daily as needed for diarrhea or loose stools.     . methocarbamol (ROBAXIN)  500 MG tablet Take 2 tablets (1,000 mg total) by mouth every 8 (eight) hours as needed for muscle spasms. 20 tablet 0  . nitroGLYCERIN (NITROSTAT) 0.4 MG SL tablet Place 1 tablet (0.4 mg total) under the tongue every 5 (five) minutes as needed for chest pain. (Patient not taking: Reported on 11/28/2017) 30 tablet 0  . ondansetron (ZOFRAN ODT) 4 MG disintegrating tablet Take 1 tablet (4 mg total) by mouth every 8 (eight) hours as needed for nausea or vomiting. 30 tablet 2  . pantoprazole (PROTONIX) 40 MG tablet TAKE 1 TABLET EVERY MORNING 90 tablet 1  . predniSONE (DELTASONE) 1 MG tablet Take 4 tablets (4 mg total) by mouth daily with breakfast. 120 tablet 0  . XARELTO 15 MG TABS tablet TAKE 1 TABLET DAILY WITH SUPPER 90 tablet 1  . Zinc 50 MG CAPS Take 50 mg by mouth daily.     No current facility-administered medications for this visit.      Past Surgical History:  Procedure Laterality Date  . ABDOMINAL HYSTERECTOMY  1977   partial  . BACK SURGERY  1982  . CARDIAC CATHETERIZATION    . COLONOSCOPY N/A 01/27/2013   Procedure: COLONOSCOPY;  Surgeon: Rogene Houston, MD;  Location: [facility];  Service: Endoscopy;  Laterality: N/A;  1030  . EYE SURGERY Bilateral 04/2017  . EYE SURGERY Right 09/23/2017  . IR KYPHO LUMBAR INC FX REDUCE BONE BX UNI/BIL CANNULATION INC/IMAGING  09/30/2017  . IR VERTEBROPLASTY CERV/THOR BX INC UNI/BIL INC/INJECT/IMAGING  09/05/2017  . JOINT REPLACEMENT  2001 and  2011   6 knee surgeries   bil replacement  . KNEE ARTHROSCOPY    . LUMBAR LAMINECTOMY    . PR VEIN BYPASS GRAFT,AORTO-FEM-POP  2010  . removal of melanoma from back  2007  . SPINE SURGERY  2013   Broken back: fusion surgery  . tonisllectomy  1944  . total knee anthroplasty    . VERTEBROPLASTY  07/11/2011   Procedure: VERTEBROPLASTY;  Surgeon: Faythe Ghee, MD;  Location: [facility];  Service: Neurosurgery;  Laterality: N/A;  Vertebroplasty of Lumbar Two     Allergies  Allergen Reactions  .  Epinephrine Other (See Comments)    During dental procedure-caused facial swelling at injection site. Pt has had epinephrine since then for nose and facial surgery and did fine with it per pt.  . Clopidogrel Bisulfate Itching and Rash    Plavix  . Oxycodone     Hallucinations. Pt states that she can tolerate hydrocodone      Family History  Problem Relation Age of Onset  . Heart disease Father   . Hypertension Father   . Heart attack Father   . Cancer Mother        bladder   . Hypertension Daughter   . Atrial fibrillation Daughter   . Hypertension Son   . Colon cancer Neg Hx      Social History Brenda Hall reports that she has never smoked. She has never used smokeless tobacco. Ms. Rauth reports that she drinks alcohol.   Review of Systems CONSTITUTIONAL: No weight loss, fever, chills, weakness or fatigue.  HEENT: Eyes: No visual loss, blurred vision, double vision or yellow sclerae.No hearing loss, sneezing, congestion, runny nose or sore throat.  SKIN: No rash or itching.  CARDIOVASCULAR: per hpi RESPIRATORY: No shortness of breath, cough or sputum.  GASTROINTESTINAL: No anorexia, nausea, vomiting or diarrhea. No abdominal pain or blood.  GENITOURINARY: No burning on urination, no polyuria NEUROLOGICAL: No headache, dizziness, syncope, paralysis, ataxia, numbness or tingling in the extremities. No change in bowel or bladder control.  MUSCULOSKELETAL: +back pain LYMPHATICS: No enlarged nodes. No history of splenectomy.  PSYCHIATRIC: No history of depression or anxiety.  ENDOCRINOLOGIC: No reports of sweating, cold or heat intolerance. No polyuria or polydipsia.  Marland Kitchen   Physical Examination Vitals:   12/03/17 1024  BP: 122/72  Pulse: 74  SpO2: 95%   Vitals:   12/03/17 1024  Weight: 151 lb 3.2 oz (68.6 kg)  Height: 5\' 3"  (1.6 m)    Gen: resting comfortably, no acute distress HEENT: no scleral icterus, pupils equal round and reactive, no palptable  cervical adenopathy,  CV: RRR, no m/r/g, no jvd Resp: Clear to auscultation bilaterally GI: abdomen is soft, non-tender, non-distended, normal bowel sounds, no hepatosplenomegaly MSK: extremities are warm, mild LLE edema Skin: warm, no rash Neuro:  no focal deficits Psych: appropriate affect   Diagnostic Studies 02/2013 Carotid US <40% bilateral ICA disease  03/2008 Echo SUMMARY - Overall left ventricular systolic function was normal. There were no left ventricular regional wall motion abnormalities. Left ventricular wall thickness was mildly increased. - There was trivial aortic valvular regurgitation. - The effective orifice of mitral regurgitation by proximal isovelocity surface area was 0.06 cm^2. The volume of mitral regurgitation by proximal isovelocity surface area was 9 cc. - The left atrium was mildly dilated. - The estimated peak right ventricular systolic pressure was within the upper limits of normal. - The right atrium was mildly dilated.    Assessment and Plan   1. CAD -doing well without symptoms, continue current meds  2. Hyperlipidemia -well controlled, continue current statin.   3. HTN - ACE-I stopped recently due to high potassium.  - bp at goal, continue current meds  4. Afib - recent issues with RVR due to other systemic stressors and difficultly keeping pills down - she is back to her steady state regarding her afib, continue TOprol 75mg  daily and xarelto.   5. LE edema -continue prn lasix, overall mild.     Arnoldo Lenis, M.D.

## 2017-12-03 NOTE — Patient Instructions (Signed)
Your physician wants you to follow-up in: [place] will receive a reminder letter in the mail two months in advance. If you don't receive a letter, please call our office to schedule the follow-up appointment.  Your physician recommends that you continue on your current medications as directed. Please refer to the Current Medication list given to you today.  Thank you for choosing [facility]!!

## 2017-12-04 ENCOUNTER — Telehealth: Payer: Self-pay | Admitting: Family Medicine

## 2017-12-04 DIAGNOSIS — H04522 Eversion of left lacrimal punctum: Secondary | ICD-10-CM | POA: Diagnosis not present

## 2017-12-04 DIAGNOSIS — H16212 Exposure keratoconjunctivitis, left eye: Secondary | ICD-10-CM | POA: Diagnosis not present

## 2017-12-04 DIAGNOSIS — H16213 Exposure keratoconjunctivitis, bilateral: Secondary | ICD-10-CM | POA: Diagnosis not present

## 2017-12-04 DIAGNOSIS — H04521 Eversion of right lacrimal punctum: Secondary | ICD-10-CM | POA: Diagnosis not present

## 2017-12-04 DIAGNOSIS — H16211 Exposure keratoconjunctivitis, right eye: Secondary | ICD-10-CM | POA: Diagnosis not present

## 2017-12-04 DIAGNOSIS — H04523 Eversion of bilateral lacrimal punctum: Secondary | ICD-10-CM | POA: Diagnosis not present

## 2017-12-04 DIAGNOSIS — H02112 Cicatricial ectropion of right lower eyelid: Secondary | ICD-10-CM | POA: Diagnosis not present

## 2017-12-04 DIAGNOSIS — H02132 Senile ectropion of right lower eyelid: Secondary | ICD-10-CM | POA: Diagnosis not present

## 2017-12-04 DIAGNOSIS — H04123 Dry eye syndrome of bilateral lacrimal glands: Secondary | ICD-10-CM | POA: Diagnosis not present

## 2017-12-04 DIAGNOSIS — H02532 Eyelid retraction right lower eyelid: Secondary | ICD-10-CM | POA: Diagnosis not present

## 2017-12-04 DIAGNOSIS — Z09 Encounter for follow-up examination after completed treatment for conditions other than malignant neoplasm: Secondary | ICD-10-CM | POA: Diagnosis not present

## 2017-12-04 NOTE — Telephone Encounter (Signed)
Please sign & date each page for pt's home health (all areas are highlighted)   Janett Billow w/[facility] will pick up Tuesday   In blue folder in yellow box

## 2017-12-05 ENCOUNTER — Other Ambulatory Visit: Payer: Self-pay | Admitting: Family Medicine

## 2017-12-05 ENCOUNTER — Ambulatory Visit (INDEPENDENT_AMBULATORY_CARE_PROVIDER_SITE_OTHER): Payer: Medicare Other | Admitting: Rheumatology

## 2017-12-05 ENCOUNTER — Encounter: Payer: Self-pay | Admitting: Rheumatology

## 2017-12-05 VITALS — BP 153/66 | HR 68 | Resp 18 | Ht 63.0 in | Wt 153.6 lb

## 2017-12-05 DIAGNOSIS — M353 Polymyalgia rheumatica: Secondary | ICD-10-CM

## 2017-12-05 DIAGNOSIS — E559 Vitamin D deficiency, unspecified: Secondary | ICD-10-CM | POA: Diagnosis not present

## 2017-12-05 DIAGNOSIS — M8589 Other specified disorders of bone density and structure, multiple sites: Secondary | ICD-10-CM

## 2017-12-05 DIAGNOSIS — I4891 Unspecified atrial fibrillation: Secondary | ICD-10-CM | POA: Diagnosis not present

## 2017-12-05 DIAGNOSIS — M1612 Unilateral primary osteoarthritis, left hip: Secondary | ICD-10-CM

## 2017-12-05 DIAGNOSIS — Z8719 Personal history of other diseases of the digestive system: Secondary | ICD-10-CM

## 2017-12-05 DIAGNOSIS — Z96653 Presence of artificial knee joint, bilateral: Secondary | ICD-10-CM | POA: Diagnosis not present

## 2017-12-05 DIAGNOSIS — Z8673 Personal history of transient ischemic attack (TIA), and cerebral infarction without residual deficits: Secondary | ICD-10-CM

## 2017-12-05 DIAGNOSIS — I2510 Atherosclerotic heart disease of native coronary artery without angina pectoris: Secondary | ICD-10-CM | POA: Diagnosis not present

## 2017-12-05 DIAGNOSIS — C4491 Basal cell carcinoma of skin, unspecified: Secondary | ICD-10-CM | POA: Diagnosis not present

## 2017-12-05 DIAGNOSIS — M19071 Primary osteoarthritis, right ankle and foot: Secondary | ICD-10-CM

## 2017-12-05 DIAGNOSIS — M19042 Primary osteoarthritis, left hand: Secondary | ICD-10-CM

## 2017-12-05 DIAGNOSIS — I10 Essential (primary) hypertension: Secondary | ICD-10-CM

## 2017-12-05 DIAGNOSIS — Z8709 Personal history of other diseases of the respiratory system: Secondary | ICD-10-CM

## 2017-12-05 DIAGNOSIS — M19041 Primary osteoarthritis, right hand: Secondary | ICD-10-CM

## 2017-12-05 DIAGNOSIS — Z8781 Personal history of (healed) traumatic fracture: Secondary | ICD-10-CM | POA: Diagnosis not present

## 2017-12-05 DIAGNOSIS — M5136 Other intervertebral disc degeneration, lumbar region: Secondary | ICD-10-CM

## 2017-12-05 DIAGNOSIS — Z7952 Long term (current) use of systemic steroids: Secondary | ICD-10-CM | POA: Diagnosis not present

## 2017-12-05 DIAGNOSIS — M19072 Primary osteoarthritis, left ankle and foot: Secondary | ICD-10-CM

## 2017-12-05 MED ORDER — ABALOPARATIDE 3120 MCG/1.56ML ~~LOC~~ SOPN: 80.0000 ug | PEN_INJECTOR | Freq: Every day | SUBCUTANEOUS | 2 refills | Status: DC

## 2017-12-05 NOTE — Patient Instructions (Signed)
Abaloparatide injection What is this medicine? ABALOPARATIDE (a ball oh PAR a tide) increases bone mass and strength. It helps make healthy bone and to slow bone loss. This medicine is used to prevent bone fracture. This medicine may be used for other purposes; ask your health care provider or pharmacist if you have questions. COMMON BRAND NAME(S): TYMLOS What should I tell my health care provider before I take this medicine? They need to know if you have any of these conditions: -bone disease other than osteoporosis -high levels of calcium in the blood -high levels of an enzyme called alkaline phosphatase in the blood -history of cancer in the bone -kidney stone -Paget's disease -parathyroid disease -receiving radiation therapy -an unusual or allergic reaction to abaloparatide, other medicines, foods, dyes, or preservatives -pregnant or trying to get pregnant -breast-feeding How should I use this medicine? This medicine is for injection under the skin. You will be taught how to prepare and give this medicine. Use exactly as directed. Take your medicine at regular intervals. Do not take your medicine more often than directed. It is important that you put your used needles and pens in a special sharps container. Do not put them in a trash can. If you do not have a sharps container, call your pharmacist or health care provider to get one. A special MedGuide will be given to you by the pharmacist with each prescription and refill. Be sure to read this information carefully each time. Talk to your pediatrician regarding the use of this medicine in children. Special care may be needed. Overdosage: If you think you have taken too much of this medicine contact a poison control center or emergency room at once. NOTE: This medicine is only for you. Do not share this medicine with others. What if I miss a dose? If you miss a dose, take it as soon as you can. If it is almost time for your next dose,  take only that dose. Do not take double or extra doses. What may interact with this medicine? Interactions have not been studied. This list may not describe all possible interactions. Give your health care provider a list of all the medicines, herbs, non-prescription drugs, or dietary supplements you use. Also tell them if you smoke, drink alcohol, or use illegal drugs. Some items may interact with your medicine. What should I watch for while using this medicine? Visit your doctor or health care professional for regular checks on your progress. You may need blood work done while you are taking this medicine. You may get dizzy. Do not drive, use machinery, or do anything that needs mental alertness until you know how this medicine affects you. Do not stand or sit up quickly, especially if you are an older patient. This reduces the risk of dizzy or fainting spells. Avoid alcoholic drinks; they can make you more dizzy. You should make sure that you get enough calcium and vitamin D while you are taking this medicine. Discuss the foods you eat and the vitamins you take with your health care professional. Talk to your doctor about your risk of cancer. You may be more at risk for certain types of cancers if you take this medicine. What side effects may I notice from receiving this medicine? Side effects that you should report to your doctor or health care professional as soon as possible: -allergic reactions like skin rash, itching or hives, swelling of the face, lips, or tongue -blood in the urine; pain in the lower back   or side; pain when urinating -signs and symptoms of low blood pressure like dizziness; feeling faint or lightheaded, falls; unusually weak or tired -signs and symptoms of increased calcium like nausea; vomiting; constipation; low energy; or muscle weakness Side effects that usually do not require medical attention (report these to your doctor or health care professional if they continue or  are bothersome): -headache -fast, irregular heartbeat -nausea -pain, redness, irritation or swelling at the injection site -stomach upset or pain -tiredness This list may not describe all possible side effects. Call your doctor for medical advice about side effects. You may report side effects to FDA at 1-800-FDA-1088. Where should I keep my medicine? Keep out of the reach of children. Store unopened pens in the refrigerator between 2 and 8 degrees C (36 and 46 degrees F). Do not freeze. After first use, store your pen for up to 30 days at room temperature between 20 and 25 degrees C (68 and 77 degrees F). Avoid exposure to heat. Throw away any unused medicine after the expiration date on the label. NOTE: This sheet is a summary. It may not cover all possible information. If you have questions about this medicine, talk to your doctor, pharmacist, or health care provider.  2018 Elsevier/Gold Standard (2015-08-28 10:03:05)  

## 2017-12-08 ENCOUNTER — Other Ambulatory Visit: Payer: Self-pay | Admitting: Pharmacist

## 2017-12-08 DIAGNOSIS — I129 Hypertensive chronic kidney disease with stage 1 through stage 4 chronic kidney disease, or unspecified chronic kidney disease: Secondary | ICD-10-CM | POA: Diagnosis not present

## 2017-12-08 DIAGNOSIS — S32019D Unspecified fracture of first lumbar vertebra, subsequent encounter for fracture with routine healing: Secondary | ICD-10-CM | POA: Diagnosis not present

## 2017-12-08 DIAGNOSIS — M545 Low back pain: Secondary | ICD-10-CM | POA: Diagnosis not present

## 2017-12-08 DIAGNOSIS — N183 Chronic kidney disease, stage 3 (moderate): Secondary | ICD-10-CM | POA: Diagnosis not present

## 2017-12-08 DIAGNOSIS — I2510 Atherosclerotic heart disease of native coronary artery without angina pectoris: Secondary | ICD-10-CM | POA: Diagnosis not present

## 2017-12-08 DIAGNOSIS — J449 Chronic obstructive pulmonary disease, unspecified: Secondary | ICD-10-CM | POA: Diagnosis not present

## 2017-12-08 NOTE — Progress Notes (Signed)
Received prior authorization form for Tymlos.  Completed attached form and faxed back.  Will update when we receive a response.  [facility] [phone]

## 2017-12-10 DIAGNOSIS — N183 Chronic kidney disease, stage 3 (moderate): Secondary | ICD-10-CM | POA: Diagnosis not present

## 2017-12-10 DIAGNOSIS — I129 Hypertensive chronic kidney disease with stage 1 through stage 4 chronic kidney disease, or unspecified chronic kidney disease: Secondary | ICD-10-CM | POA: Diagnosis not present

## 2017-12-10 DIAGNOSIS — J449 Chronic obstructive pulmonary disease, unspecified: Secondary | ICD-10-CM | POA: Diagnosis not present

## 2017-12-10 DIAGNOSIS — M545 Low back pain: Secondary | ICD-10-CM | POA: Diagnosis not present

## 2017-12-10 DIAGNOSIS — S32019D Unspecified fracture of first lumbar vertebra, subsequent encounter for fracture with routine healing: Secondary | ICD-10-CM | POA: Diagnosis not present

## 2017-12-10 DIAGNOSIS — I2510 Atherosclerotic heart disease of native coronary artery without angina pectoris: Secondary | ICD-10-CM | POA: Diagnosis not present

## 2017-12-15 ENCOUNTER — Telehealth: Payer: Self-pay | Admitting: Pharmacy Technician

## 2017-12-15 ENCOUNTER — Telehealth: Payer: Self-pay | Admitting: Rheumatology

## 2017-12-15 DIAGNOSIS — I129 Hypertensive chronic kidney disease with stage 1 through stage 4 chronic kidney disease, or unspecified chronic kidney disease: Secondary | ICD-10-CM | POA: Diagnosis not present

## 2017-12-15 DIAGNOSIS — S32019D Unspecified fracture of first lumbar vertebra, subsequent encounter for fracture with routine healing: Secondary | ICD-10-CM | POA: Diagnosis not present

## 2017-12-15 DIAGNOSIS — J449 Chronic obstructive pulmonary disease, unspecified: Secondary | ICD-10-CM | POA: Diagnosis not present

## 2017-12-15 DIAGNOSIS — I2510 Atherosclerotic heart disease of native coronary artery without angina pectoris: Secondary | ICD-10-CM | POA: Diagnosis not present

## 2017-12-15 DIAGNOSIS — N183 Chronic kidney disease, stage 3 (moderate): Secondary | ICD-10-CM | POA: Diagnosis not present

## 2017-12-15 DIAGNOSIS — M545 Low back pain: Secondary | ICD-10-CM | POA: Diagnosis not present

## 2017-12-15 NOTE — Telephone Encounter (Signed)
Patient called stating she received the Tymlos and requested a return call.

## 2017-12-15 NOTE — Telephone Encounter (Signed)
Patient states she was approved to get the Tymlos. Patient has been scheduled with Amber to learn how to use the pen.

## 2017-12-15 NOTE — Telephone Encounter (Signed)
Received a fax from [facility] regarding a prior authorization for [employer]. Authorization has been APPROVED from 12/09/17 to 12/08/19. Authorization dates may change based on future review of the drug and drug class by [facility].  Will send document to scan center.  Phone # [phone]  3:48 PM Beatriz Chancellor, CPhT

## 2017-12-17 ENCOUNTER — Ambulatory Visit (INDEPENDENT_AMBULATORY_CARE_PROVIDER_SITE_OTHER): Payer: Medicare Other | Admitting: Pharmacist

## 2017-12-17 DIAGNOSIS — J449 Chronic obstructive pulmonary disease, unspecified: Secondary | ICD-10-CM | POA: Diagnosis not present

## 2017-12-17 DIAGNOSIS — M353 Polymyalgia rheumatica: Secondary | ICD-10-CM | POA: Diagnosis not present

## 2017-12-17 DIAGNOSIS — M810 Age-related osteoporosis without current pathological fracture: Secondary | ICD-10-CM | POA: Diagnosis not present

## 2017-12-17 DIAGNOSIS — M545 Low back pain: Secondary | ICD-10-CM | POA: Diagnosis not present

## 2017-12-17 DIAGNOSIS — I2510 Atherosclerotic heart disease of native coronary artery without angina pectoris: Secondary | ICD-10-CM | POA: Diagnosis not present

## 2017-12-17 DIAGNOSIS — S32019D Unspecified fracture of first lumbar vertebra, subsequent encounter for fracture with routine healing: Secondary | ICD-10-CM | POA: Diagnosis not present

## 2017-12-17 DIAGNOSIS — I129 Hypertensive chronic kidney disease with stage 1 through stage 4 chronic kidney disease, or unspecified chronic kidney disease: Secondary | ICD-10-CM | POA: Diagnosis not present

## 2017-12-17 DIAGNOSIS — N183 Chronic kidney disease, stage 3 (moderate): Secondary | ICD-10-CM | POA: Diagnosis not present

## 2017-12-17 MED ORDER — PEN NEEDLES 30G X 8 MM MISC: 1.0000 | Freq: Every day | 3 refills | Status: DC

## 2017-12-17 MED ORDER — ABALOPARATIDE 3120 MCG/1.56ML ~~LOC~~ SOPN
80.0000 ug | PEN_INJECTOR | Freq: Once | SUBCUTANEOUS | Status: AC
  Administered 2017-12-17: 80 ug via SUBCUTANEOUS

## 2017-12-17 NOTE — Progress Notes (Signed)
Pharmacy Counseling Note  Patient presented to [facility] orthopedics for Tymlos  injection technique counseling.  Patient received her first dose of medication at home through [facility].  She states that she was not sent any pens along with medication.  Sent in prescription to [facility] for pen needles.  Patient was given a 30-day supply of pins today from the office as well as a sharps container.  Instructed patient to buy alcohol swabs over-the-counter.  Reviewed proper administration, dose, and storage of Tymlos.  Reviewed common side effects.  All questions encouraged and answered.  Demonstrated proper injection technique with Tymlos demo pen.  Patient able to demonstrate proper injection technique using the teach back method. Patient self injected in the left lower abdomen with patient supplied medication.  Medication: Tymlos NDC: 46962-952-84 Lot: [id number] Expiration: 12/2018  Patient tolerated well without any localized reaction.  Patient did report a "tightness" in her right arm after administration.  She stated that it did not make sense since she did not receive the injection in her arm but it felt like when she got a flu shot.  Educated patient that this was unlikely a reaction to the injection but could be due to the stress of self injection.  Patient was given a glass of water and monitored for 15 minutes.  She denied any dizziness or shortness of breath.  After 15 minutes of monitoring patient felt better and stated that she wanted to leave.  Patient was able to stand up and walk out of patient room without any issues.  Instructed patient to monitor her reaction to the injection the next couple of days and to give [place] a call if she had any issues.  We will continue to monitor.  Mariella Saa, PharmD, [facility] Rheumatology Clinical Pharmacist  12/17/2017 3:24 PM

## 2017-12-17 NOTE — Addendum Note (Signed)
Addended by: Mariella Saa C on: 12/17/2017 03:26 PM   Modules accepted: Level of Service

## 2017-12-19 DIAGNOSIS — I129 Hypertensive chronic kidney disease with stage 1 through stage 4 chronic kidney disease, or unspecified chronic kidney disease: Secondary | ICD-10-CM | POA: Diagnosis not present

## 2017-12-19 DIAGNOSIS — N183 Chronic kidney disease, stage 3 (moderate): Secondary | ICD-10-CM | POA: Diagnosis not present

## 2017-12-19 DIAGNOSIS — S32019D Unspecified fracture of first lumbar vertebra, subsequent encounter for fracture with routine healing: Secondary | ICD-10-CM | POA: Diagnosis not present

## 2017-12-19 DIAGNOSIS — I2510 Atherosclerotic heart disease of native coronary artery without angina pectoris: Secondary | ICD-10-CM | POA: Diagnosis not present

## 2017-12-19 DIAGNOSIS — J449 Chronic obstructive pulmonary disease, unspecified: Secondary | ICD-10-CM | POA: Diagnosis not present

## 2017-12-19 DIAGNOSIS — M545 Low back pain: Secondary | ICD-10-CM | POA: Diagnosis not present

## 2017-12-21 DIAGNOSIS — I2510 Atherosclerotic heart disease of native coronary artery without angina pectoris: Secondary | ICD-10-CM | POA: Diagnosis not present

## 2017-12-21 DIAGNOSIS — G8929 Other chronic pain: Secondary | ICD-10-CM | POA: Diagnosis not present

## 2017-12-21 DIAGNOSIS — M353 Polymyalgia rheumatica: Secondary | ICD-10-CM | POA: Diagnosis not present

## 2017-12-21 DIAGNOSIS — Z85820 Personal history of malignant melanoma of skin: Secondary | ICD-10-CM | POA: Diagnosis not present

## 2017-12-21 DIAGNOSIS — N183 Chronic kidney disease, stage 3 (moderate): Secondary | ICD-10-CM | POA: Diagnosis not present

## 2017-12-21 DIAGNOSIS — S32019D Unspecified fracture of first lumbar vertebra, subsequent encounter for fracture with routine healing: Secondary | ICD-10-CM | POA: Diagnosis not present

## 2017-12-21 DIAGNOSIS — J449 Chronic obstructive pulmonary disease, unspecified: Secondary | ICD-10-CM | POA: Diagnosis not present

## 2017-12-21 DIAGNOSIS — Z8673 Personal history of transient ischemic attack (TIA), and cerebral infarction without residual deficits: Secondary | ICD-10-CM | POA: Diagnosis not present

## 2017-12-21 DIAGNOSIS — M810 Age-related osteoporosis without current pathological fracture: Secondary | ICD-10-CM | POA: Diagnosis not present

## 2017-12-21 DIAGNOSIS — M170 Bilateral primary osteoarthritis of knee: Secondary | ICD-10-CM | POA: Diagnosis not present

## 2017-12-21 DIAGNOSIS — K589 Irritable bowel syndrome without diarrhea: Secondary | ICD-10-CM | POA: Diagnosis not present

## 2017-12-21 DIAGNOSIS — I129 Hypertensive chronic kidney disease with stage 1 through stage 4 chronic kidney disease, or unspecified chronic kidney disease: Secondary | ICD-10-CM | POA: Diagnosis not present

## 2017-12-21 DIAGNOSIS — I4891 Unspecified atrial fibrillation: Secondary | ICD-10-CM | POA: Diagnosis not present

## 2017-12-24 DIAGNOSIS — I2510 Atherosclerotic heart disease of native coronary artery without angina pectoris: Secondary | ICD-10-CM | POA: Diagnosis not present

## 2017-12-24 DIAGNOSIS — M353 Polymyalgia rheumatica: Secondary | ICD-10-CM | POA: Diagnosis not present

## 2017-12-24 DIAGNOSIS — S32019D Unspecified fracture of first lumbar vertebra, subsequent encounter for fracture with routine healing: Secondary | ICD-10-CM | POA: Diagnosis not present

## 2017-12-24 DIAGNOSIS — I4891 Unspecified atrial fibrillation: Secondary | ICD-10-CM | POA: Diagnosis not present

## 2017-12-24 DIAGNOSIS — I129 Hypertensive chronic kidney disease with stage 1 through stage 4 chronic kidney disease, or unspecified chronic kidney disease: Secondary | ICD-10-CM | POA: Diagnosis not present

## 2017-12-24 DIAGNOSIS — G8929 Other chronic pain: Secondary | ICD-10-CM | POA: Diagnosis not present

## 2017-12-26 DIAGNOSIS — I2510 Atherosclerotic heart disease of native coronary artery without angina pectoris: Secondary | ICD-10-CM | POA: Diagnosis not present

## 2017-12-26 DIAGNOSIS — G8929 Other chronic pain: Secondary | ICD-10-CM | POA: Diagnosis not present

## 2017-12-26 DIAGNOSIS — I129 Hypertensive chronic kidney disease with stage 1 through stage 4 chronic kidney disease, or unspecified chronic kidney disease: Secondary | ICD-10-CM | POA: Diagnosis not present

## 2017-12-26 DIAGNOSIS — S32019D Unspecified fracture of first lumbar vertebra, subsequent encounter for fracture with routine healing: Secondary | ICD-10-CM | POA: Diagnosis not present

## 2017-12-26 DIAGNOSIS — I4891 Unspecified atrial fibrillation: Secondary | ICD-10-CM | POA: Diagnosis not present

## 2017-12-26 DIAGNOSIS — M353 Polymyalgia rheumatica: Secondary | ICD-10-CM | POA: Diagnosis not present

## 2018-01-01 DIAGNOSIS — I4891 Unspecified atrial fibrillation: Secondary | ICD-10-CM | POA: Diagnosis not present

## 2018-01-01 DIAGNOSIS — I2510 Atherosclerotic heart disease of native coronary artery without angina pectoris: Secondary | ICD-10-CM | POA: Diagnosis not present

## 2018-01-01 DIAGNOSIS — S32019D Unspecified fracture of first lumbar vertebra, subsequent encounter for fracture with routine healing: Secondary | ICD-10-CM | POA: Diagnosis not present

## 2018-01-01 DIAGNOSIS — M353 Polymyalgia rheumatica: Secondary | ICD-10-CM | POA: Diagnosis not present

## 2018-01-01 DIAGNOSIS — G8929 Other chronic pain: Secondary | ICD-10-CM | POA: Diagnosis not present

## 2018-01-01 DIAGNOSIS — I129 Hypertensive chronic kidney disease with stage 1 through stage 4 chronic kidney disease, or unspecified chronic kidney disease: Secondary | ICD-10-CM | POA: Diagnosis not present

## 2018-01-05 DIAGNOSIS — I4891 Unspecified atrial fibrillation: Secondary | ICD-10-CM | POA: Diagnosis not present

## 2018-01-05 DIAGNOSIS — S32019D Unspecified fracture of first lumbar vertebra, subsequent encounter for fracture with routine healing: Secondary | ICD-10-CM | POA: Diagnosis not present

## 2018-01-05 DIAGNOSIS — I2510 Atherosclerotic heart disease of native coronary artery without angina pectoris: Secondary | ICD-10-CM | POA: Diagnosis not present

## 2018-01-05 DIAGNOSIS — G8929 Other chronic pain: Secondary | ICD-10-CM | POA: Diagnosis not present

## 2018-01-05 DIAGNOSIS — M353 Polymyalgia rheumatica: Secondary | ICD-10-CM | POA: Diagnosis not present

## 2018-01-05 DIAGNOSIS — I129 Hypertensive chronic kidney disease with stage 1 through stage 4 chronic kidney disease, or unspecified chronic kidney disease: Secondary | ICD-10-CM | POA: Diagnosis not present

## 2018-01-06 DIAGNOSIS — S32019D Unspecified fracture of first lumbar vertebra, subsequent encounter for fracture with routine healing: Secondary | ICD-10-CM | POA: Diagnosis not present

## 2018-01-06 DIAGNOSIS — I2510 Atherosclerotic heart disease of native coronary artery without angina pectoris: Secondary | ICD-10-CM | POA: Diagnosis not present

## 2018-01-06 DIAGNOSIS — I4891 Unspecified atrial fibrillation: Secondary | ICD-10-CM | POA: Diagnosis not present

## 2018-01-06 DIAGNOSIS — G8929 Other chronic pain: Secondary | ICD-10-CM | POA: Diagnosis not present

## 2018-01-06 DIAGNOSIS — M353 Polymyalgia rheumatica: Secondary | ICD-10-CM | POA: Diagnosis not present

## 2018-01-06 DIAGNOSIS — I129 Hypertensive chronic kidney disease with stage 1 through stage 4 chronic kidney disease, or unspecified chronic kidney disease: Secondary | ICD-10-CM | POA: Diagnosis not present

## 2018-01-07 DIAGNOSIS — I4891 Unspecified atrial fibrillation: Secondary | ICD-10-CM | POA: Diagnosis not present

## 2018-01-07 DIAGNOSIS — M353 Polymyalgia rheumatica: Secondary | ICD-10-CM | POA: Diagnosis not present

## 2018-01-07 DIAGNOSIS — S32019D Unspecified fracture of first lumbar vertebra, subsequent encounter for fracture with routine healing: Secondary | ICD-10-CM | POA: Diagnosis not present

## 2018-01-07 DIAGNOSIS — I129 Hypertensive chronic kidney disease with stage 1 through stage 4 chronic kidney disease, or unspecified chronic kidney disease: Secondary | ICD-10-CM | POA: Diagnosis not present

## 2018-01-07 DIAGNOSIS — G8929 Other chronic pain: Secondary | ICD-10-CM | POA: Diagnosis not present

## 2018-01-07 DIAGNOSIS — I2510 Atherosclerotic heart disease of native coronary artery without angina pectoris: Secondary | ICD-10-CM | POA: Diagnosis not present

## 2018-01-08 DIAGNOSIS — I4891 Unspecified atrial fibrillation: Secondary | ICD-10-CM | POA: Diagnosis not present

## 2018-01-08 DIAGNOSIS — I129 Hypertensive chronic kidney disease with stage 1 through stage 4 chronic kidney disease, or unspecified chronic kidney disease: Secondary | ICD-10-CM | POA: Diagnosis not present

## 2018-01-08 DIAGNOSIS — M353 Polymyalgia rheumatica: Secondary | ICD-10-CM | POA: Diagnosis not present

## 2018-01-08 DIAGNOSIS — S32019D Unspecified fracture of first lumbar vertebra, subsequent encounter for fracture with routine healing: Secondary | ICD-10-CM | POA: Diagnosis not present

## 2018-01-08 DIAGNOSIS — I2510 Atherosclerotic heart disease of native coronary artery without angina pectoris: Secondary | ICD-10-CM | POA: Diagnosis not present

## 2018-01-08 DIAGNOSIS — G8929 Other chronic pain: Secondary | ICD-10-CM | POA: Diagnosis not present

## 2018-01-09 ENCOUNTER — Other Ambulatory Visit: Payer: Self-pay | Admitting: Rheumatology

## 2018-01-09 NOTE — Telephone Encounter (Signed)
Last Visit: 12/05/17 Next Visit: 03/06/18  Okay to refill per Dr. Estanislado Pandy

## 2018-01-12 DIAGNOSIS — I2510 Atherosclerotic heart disease of native coronary artery without angina pectoris: Secondary | ICD-10-CM | POA: Diagnosis not present

## 2018-01-12 DIAGNOSIS — M353 Polymyalgia rheumatica: Secondary | ICD-10-CM | POA: Diagnosis not present

## 2018-01-12 DIAGNOSIS — I4891 Unspecified atrial fibrillation: Secondary | ICD-10-CM | POA: Diagnosis not present

## 2018-01-12 DIAGNOSIS — G8929 Other chronic pain: Secondary | ICD-10-CM | POA: Diagnosis not present

## 2018-01-12 DIAGNOSIS — I129 Hypertensive chronic kidney disease with stage 1 through stage 4 chronic kidney disease, or unspecified chronic kidney disease: Secondary | ICD-10-CM | POA: Diagnosis not present

## 2018-01-12 DIAGNOSIS — S32019D Unspecified fracture of first lumbar vertebra, subsequent encounter for fracture with routine healing: Secondary | ICD-10-CM | POA: Diagnosis not present

## 2018-01-13 DIAGNOSIS — N183 Chronic kidney disease, stage 3 (moderate): Secondary | ICD-10-CM | POA: Diagnosis not present

## 2018-01-14 DIAGNOSIS — I2510 Atherosclerotic heart disease of native coronary artery without angina pectoris: Secondary | ICD-10-CM | POA: Diagnosis not present

## 2018-01-14 DIAGNOSIS — G8929 Other chronic pain: Secondary | ICD-10-CM | POA: Diagnosis not present

## 2018-01-14 DIAGNOSIS — S32019D Unspecified fracture of first lumbar vertebra, subsequent encounter for fracture with routine healing: Secondary | ICD-10-CM | POA: Diagnosis not present

## 2018-01-14 DIAGNOSIS — I129 Hypertensive chronic kidney disease with stage 1 through stage 4 chronic kidney disease, or unspecified chronic kidney disease: Secondary | ICD-10-CM | POA: Diagnosis not present

## 2018-01-14 DIAGNOSIS — M353 Polymyalgia rheumatica: Secondary | ICD-10-CM | POA: Diagnosis not present

## 2018-01-14 DIAGNOSIS — I4891 Unspecified atrial fibrillation: Secondary | ICD-10-CM | POA: Diagnosis not present

## 2018-01-19 ENCOUNTER — Telehealth: Payer: Self-pay | Admitting: Rheumatology

## 2018-01-19 DIAGNOSIS — G8929 Other chronic pain: Secondary | ICD-10-CM | POA: Diagnosis not present

## 2018-01-19 DIAGNOSIS — I129 Hypertensive chronic kidney disease with stage 1 through stage 4 chronic kidney disease, or unspecified chronic kidney disease: Secondary | ICD-10-CM | POA: Diagnosis not present

## 2018-01-19 DIAGNOSIS — S32019D Unspecified fracture of first lumbar vertebra, subsequent encounter for fracture with routine healing: Secondary | ICD-10-CM | POA: Diagnosis not present

## 2018-01-19 DIAGNOSIS — M353 Polymyalgia rheumatica: Secondary | ICD-10-CM | POA: Diagnosis not present

## 2018-01-19 DIAGNOSIS — I4891 Unspecified atrial fibrillation: Secondary | ICD-10-CM | POA: Diagnosis not present

## 2018-01-19 DIAGNOSIS — I2510 Atherosclerotic heart disease of native coronary artery without angina pectoris: Secondary | ICD-10-CM | POA: Diagnosis not present

## 2018-01-19 NOTE — Telephone Encounter (Signed)
Patient left a voicemail stating she has questions regarding her injections.  Patient is requesting a return call.

## 2018-01-20 NOTE — Telephone Encounter (Signed)
Patient called to inform us that she has had problems obtaining her Tymlos prescription.  States that [facility] told her she must call 2 weeks in advance in order to ensure shipment of medication.  She was upset that she was not told this from the start and there has been a delay in her getting her injection.  Patient wanted to know if there was anything wrong with missing a few days of her medication.  Let patient know that she should not experience any adverse effects from missing a few doses.  Instructed patient to resume her daily injection when medication arrives.  During her office visit for her first dose of Tymlos patient did experience some burning and dizziness.  Patient stated that those symptoms have subsided.  We will follow-up with [facility] to try and expedite the process.  [facility] was supposed to mail medication on Monday and stated it should arrive by Thursday.  Patient is to call the office if she has not received her medication by Thursday.

## 2018-01-20 NOTE — Telephone Encounter (Signed)
Called [facility] and was transferred to [place]. Patient's shipment is scheduled for 01/30/18 to deliver 01/31/18. And can not be expedited. Rep Dionne, confirmed that patient must schedule delivery 2 weeks prior to each delivery. Patient's copay is $53 for 90 day supply. Spoke to [employer], and we can give patient a sample. Called patient, left message to call back.  3:17 PM Brenda Hall, CPhT

## 2018-01-21 NOTE — Telephone Encounter (Signed)
Called and spoke to patient, she will come to office today around 1:30-2pm to pick up a sample. This will give her time to plan for future shipments.

## 2018-01-22 DIAGNOSIS — I2510 Atherosclerotic heart disease of native coronary artery without angina pectoris: Secondary | ICD-10-CM | POA: Diagnosis not present

## 2018-01-22 DIAGNOSIS — G8929 Other chronic pain: Secondary | ICD-10-CM | POA: Diagnosis not present

## 2018-01-22 DIAGNOSIS — M353 Polymyalgia rheumatica: Secondary | ICD-10-CM | POA: Diagnosis not present

## 2018-01-22 DIAGNOSIS — I129 Hypertensive chronic kidney disease with stage 1 through stage 4 chronic kidney disease, or unspecified chronic kidney disease: Secondary | ICD-10-CM | POA: Diagnosis not present

## 2018-01-22 DIAGNOSIS — S32019D Unspecified fracture of first lumbar vertebra, subsequent encounter for fracture with routine healing: Secondary | ICD-10-CM | POA: Diagnosis not present

## 2018-01-22 DIAGNOSIS — I4891 Unspecified atrial fibrillation: Secondary | ICD-10-CM | POA: Diagnosis not present

## 2018-01-26 DIAGNOSIS — I129 Hypertensive chronic kidney disease with stage 1 through stage 4 chronic kidney disease, or unspecified chronic kidney disease: Secondary | ICD-10-CM | POA: Diagnosis not present

## 2018-01-26 DIAGNOSIS — S32019D Unspecified fracture of first lumbar vertebra, subsequent encounter for fracture with routine healing: Secondary | ICD-10-CM | POA: Diagnosis not present

## 2018-01-26 DIAGNOSIS — G8929 Other chronic pain: Secondary | ICD-10-CM | POA: Diagnosis not present

## 2018-01-26 DIAGNOSIS — I4891 Unspecified atrial fibrillation: Secondary | ICD-10-CM | POA: Diagnosis not present

## 2018-01-26 DIAGNOSIS — I2510 Atherosclerotic heart disease of native coronary artery without angina pectoris: Secondary | ICD-10-CM | POA: Diagnosis not present

## 2018-01-26 DIAGNOSIS — M353 Polymyalgia rheumatica: Secondary | ICD-10-CM | POA: Diagnosis not present

## 2018-01-27 ENCOUNTER — Encounter: Payer: Self-pay | Admitting: Family Medicine

## 2018-01-27 ENCOUNTER — Ambulatory Visit (INDEPENDENT_AMBULATORY_CARE_PROVIDER_SITE_OTHER): Payer: Medicare Other | Admitting: Family Medicine

## 2018-01-27 VITALS — BP 136/82 | Ht 63.0 in | Wt 152.0 lb

## 2018-01-27 DIAGNOSIS — M792 Neuralgia and neuritis, unspecified: Secondary | ICD-10-CM

## 2018-01-27 DIAGNOSIS — I10 Essential (primary) hypertension: Secondary | ICD-10-CM

## 2018-01-27 DIAGNOSIS — I2510 Atherosclerotic heart disease of native coronary artery without angina pectoris: Secondary | ICD-10-CM

## 2018-01-27 DIAGNOSIS — I4891 Unspecified atrial fibrillation: Secondary | ICD-10-CM

## 2018-01-27 DIAGNOSIS — Z23 Encounter for immunization: Secondary | ICD-10-CM

## 2018-01-27 MED ORDER — ATORVASTATIN CALCIUM 80 MG PO TABS: 80.0000 mg | ORAL_TABLET | Freq: Every day | ORAL | 1 refills | Status: DC

## 2018-01-27 NOTE — Progress Notes (Signed)
   Subjective:    Patient ID: Brenda Hall, female    DOB: [birth year], 84 y.o.   MRN: [id number]  Hypertension  This is a chronic problem. The current episode started more than 1 year ago. Risk factors for coronary artery disease include post-menopausal state and dyslipidemia. Treatments tried: norvasc, toprol. There are no compliance problems.    Appetite overall btter, hads added weight back on, energy overall has improved, genral well being is overall beter  No pain at this time   ppain has pretty much faded  Still getting PT ea week   Pt notes ongoing fatigue tired a lot, not as much usual enrgy as usual    Exhausted all the tine  This compliance with her cardiac medications    Patient continues to take lipid medication regularly. No obvious side effects from it. Generally does not miss a dose. Prior blood work results are reviewed with patient. Patient continues to work on fat intake in diet  Patient notes substantial fatigue.  Felt to be multifactorial in nature.  Please see prior notes ongoing challenges with back pain.  Also sees cardiologist regularly for atrial fibrillation was not seen for a while.  Patient aunt about maintain a gym despite realizing it slightly increases her risk of certain cancers                            Review of Systems No headache, no major weight loss or weight gain, no chest pain no back pain abdominal pain no change in bowel habits complete ROS otherwise negative     Objective:   Physical Exam  Alert and oriented, vitals reviewed and stable, NAD ENT-TM's and ext canals WNL bilat via otoscopic exam Soft palate, tonsils and post pharynx WNL via oropharyngeal exam Neck-symmetric, no masses; thyroid nonpalpable and nontender Pulmonary-no tachypnea or accessory muscle use; Clear without wheezes via auscultation Card--no abnrml murmurs,  notrhythm reg but rate WNL Carotid pulses symmetric, without  bruits       Assessment & Plan:  Impression 1 chronic neuropathic pain ongoing  2.  Hyperlipidemia LDL good control on last blood work  3.  Fibrillation rate slightly increased but overall good control.  Patient compliant with medications  4.  History of TIA  5.  Coronary artery disease  Greater than 50% of this 25 minute face to face visit was spent in counseling and discussion and coordination of care regarding the above diagnosis/diagnosies

## 2018-01-29 DIAGNOSIS — S32019D Unspecified fracture of first lumbar vertebra, subsequent encounter for fracture with routine healing: Secondary | ICD-10-CM | POA: Diagnosis not present

## 2018-01-29 DIAGNOSIS — I129 Hypertensive chronic kidney disease with stage 1 through stage 4 chronic kidney disease, or unspecified chronic kidney disease: Secondary | ICD-10-CM | POA: Diagnosis not present

## 2018-01-29 DIAGNOSIS — I4891 Unspecified atrial fibrillation: Secondary | ICD-10-CM | POA: Diagnosis not present

## 2018-01-29 DIAGNOSIS — I2510 Atherosclerotic heart disease of native coronary artery without angina pectoris: Secondary | ICD-10-CM | POA: Diagnosis not present

## 2018-01-29 DIAGNOSIS — M353 Polymyalgia rheumatica: Secondary | ICD-10-CM | POA: Diagnosis not present

## 2018-01-29 DIAGNOSIS — G8929 Other chronic pain: Secondary | ICD-10-CM | POA: Diagnosis not present

## 2018-02-07 ENCOUNTER — Other Ambulatory Visit: Payer: Self-pay | Admitting: Family Medicine

## 2018-02-18 ENCOUNTER — Telehealth: Payer: Self-pay | Admitting: Cardiology

## 2018-02-18 NOTE — Telephone Encounter (Signed)
Form filled out and to be faxed today, given to Shelba Flake MD

## 2018-02-18 NOTE — Telephone Encounter (Signed)
Jenna from [facility] called to check on the pt's surgical clearance that was faxed on 10/25. Pt is scheduled for surgery next week  Please call [phone]

## 2018-02-18 NOTE — Telephone Encounter (Signed)
Surgical clearance on Dr.Branch'sdesk to complete

## 2018-02-20 NOTE — Progress Notes (Deleted)
Office Visit Note  Patient: Brenda Hall             Date of Birth: [birth year]           MRN: [id number]             PCP: [name], MD Referring: [name], MD Visit Date: 03/06/2018 Occupation: @GUAROCC @  Subjective:  No chief complaint on file.  Hospitalized on 03/02/18 for stroke  Current regimen includes Tymlos 26mcg subq daily started on 12/17/17. Prior therapy includes Fosamax which was not tolerated due to GI upset. History of long term prednisone use and vertebral fracture. DEXA : T-score of -2.0 left one third distal radius there was a change of -5% compared to her previous bone density of May 23, 2015.  Most recent CBC/CMP showed anemia and decrease in renal function during hospital visit on 03/02/18.  History of Present Illness: Brenda Hall is a 84 y.o. female with history of PMR, osteoarthritis, DDD.   Activities of Daily Living:  Patient reports morning stiffness for *** {minute/hour:19697}.   Patient {ACTIONS;DENIES/REPORTS:21021675::"Denies"} nocturnal pain.  Difficulty dressing/grooming: {ACTIONS;DENIES/REPORTS:21021675::"Denies"} Difficulty climbing stairs: {ACTIONS;DENIES/REPORTS:21021675::"Denies"} Difficulty getting out of chair: {ACTIONS;DENIES/REPORTS:21021675::"Denies"} Difficulty using hands for taps, buttons, cutlery, and/or writing: {ACTIONS;DENIES/REPORTS:21021675::"Denies"}  No Rheumatology ROS completed.   PMFS History:  Patient Active Problem List   Diagnosis Date Noted  . Atrial fibrillation with RVR ([place]) 10/24/2017  . Nausea 10/24/2017  . Nausea 10/24/2017  . Acute bilateral low back pain without sciatica   . E. coli UTI   . Community acquired pneumonia   . L1 vertebral fracture ([place]) 09/27/2017  . UTI (urinary tract infection) 09/27/2017  . CKD (chronic kidney disease), stage III ([place])   . History of TIA (transient ischemic attack)   . PAF (paroxysmal atrial fibrillation) ([place])   . Atrial fibrillation  with rapid ventricular response ([place]) 02/12/2017  . Hyperlipidemia 02/12/2017  . Pulmonary edema 02/12/2017  . Urgency of micturition 02/12/2017  . Osteopenia of multiple sites 07/23/2016  . Age-related osteoporosis without current pathological fracture 07/11/2016  . History of total knee replacement, bilateral 07/11/2016  . History of COPD 07/11/2016  . Spondylosis of lumbar region without myelopathy or radiculopathy 07/11/2016  . Primary osteoarthritis of both hands 07/11/2016  . Primary osteoarthritis of both feet 07/11/2016  . Vitamin D deficiency 07/11/2016  . History of vertebral fracture 07/11/2016  . History of IBS 07/11/2016  . Basal cell carcinoma 07/11/2016  . History of CVA (cerebrovascular accident)/ has metal implant not MRI safe  07/11/2016  . Other and unspecified hyperlipidemia 11/19/2012  . Cerebrovascular accident (stroke) ([place]) 11/19/2012  . Difficulty in walking(719.7) 11/06/2012  . Occlusion and stenosis of carotid artery without mention of cerebral infarction 02/07/2012  . OLD MYOCARDIAL INFARCTION 11/07/2009  . CAD, NATIVE VESSEL 11/07/2009  . Essential hypertension 09/28/2008  . IRRITABLE BOWEL SYNDROME 09/28/2008  . DEGENERATIVE JOINT DISEASE, KNEE 09/28/2008  . POLYMYALGIA RHEUMATICA 09/28/2008  . Chest pain, unspecified 09/28/2008    Past Medical History:  Diagnosis Date  . Anemia   . CAD in native artery    a. NSTEMI 12/2005 s/p BMS to Cx, prior normal LVEF.  Marland Kitchen Cancer ([place]) 1988 and  2009   BCC nose and  Melanoma back  . Cancer of skin Jan. 2014   [facility] Forehead and Nose  . Carotid stenosis    mild  . Chronic back pain   . CKD (chronic kidney disease), stage III ([place])   . Coronary  atherosclerosis of unspecified type of vessel, native or graft   . DJD (degenerative joint disease) of knee   . History of TIA (transient ischemic attack)   . HTN (hypertension)   . IBS (irritable bowel syndrome)   . Myocardial infarction [facility])    5 yrs ago     dr  wall  cardiac  . Osteoporosis   . Other diseases of lung, not elsewhere classified    lung nodule  . Polymyalgia rheumatica ([place])   . Shingles 01/2016-02/2016  . Stroke [facility])     Family History  Problem Relation Age of Onset  . Heart disease Father   . Hypertension Father   . Heart attack Father   . Cancer Mother        bladder   . Hypertension Daughter   . Atrial fibrillation Daughter   . Hypertension Son   . Colon cancer Neg Hx    Past Surgical History:  Procedure Laterality Date  . ABDOMINAL HYSTERECTOMY  1977   partial  . BACK SURGERY  1982  . CARDIAC CATHETERIZATION    . COLONOSCOPY N/A 01/27/2013   Procedure: COLONOSCOPY;  Surgeon: Rogene Houston, MD;  Location: [facility];  Service: Endoscopy;  Laterality: N/A;  1030  . EYE SURGERY Bilateral 04/2017  . EYE SURGERY Right 09/23/2017  . IR KYPHO LUMBAR INC FX REDUCE BONE BX UNI/BIL CANNULATION INC/IMAGING  09/30/2017  . IR VERTEBROPLASTY CERV/THOR BX INC UNI/BIL INC/INJECT/IMAGING  09/05/2017  . JOINT REPLACEMENT  2001 and  2011   6 knee surgeries   bil replacement  . KNEE ARTHROPLASTY    . KNEE ARTHROSCOPY    . LUMBAR LAMINECTOMY    . PR VEIN BYPASS GRAFT,AORTO-FEM-POP  2010  . removal of melanoma from back  2007  . SPINE SURGERY  2013   Broken back: fusion surgery  . tonisllectomy  1944  . total knee anthroplasty    . VERTEBROPLASTY  07/11/2011   Procedure: VERTEBROPLASTY;  Surgeon: Faythe Ghee, MD;  Location: [facility];  Service: Neurosurgery;  Laterality: N/A;  Vertebroplasty of Lumbar Two   Social History   Social History Narrative   Married, retired. 3 children. Regular exercise -yes; hobbies = swimming.    Objective: Vital Signs: There were no vitals taken for this visit.   Physical Exam   Musculoskeletal Exam: ***  CDAI Exam: CDAI Score: Not documented Patient Global Assessment: Not documented; Provider Global Assessment: Not documented Swollen: Not documented; Tender: Not  documented Joint Exam   Not documented   There is currently no information documented on the homunculus. Go to the Rheumatology activity and complete the homunculus joint exam.  Investigation: No additional findings.  Imaging: No results found.  Recent Labs: Lab Results  Component Value Date   WBC 8.0 10/25/2017   HGB 11.6 (L) 10/25/2017   PLT 499 (H) 10/25/2017   NA 141 11/03/2017   K 4.4 11/03/2017   CL 101 11/03/2017   CO2 32 11/03/2017   GLUCOSE 99 11/03/2017   BUN 20 11/03/2017   CREATININE 1.38 (H) 11/03/2017   BILITOT 0.3 11/03/2017   ALKPHOS 68 10/26/2017   AST 21 11/03/2017   ALT 13 11/03/2017   PROT 6.6 11/03/2017   PROT 6.7 11/03/2017   ALBUMIN 2.8 (L) 10/26/2017   CALCIUM 9.7 11/03/2017   GFRAA 41 (L) 11/03/2017    Speciality Comments: No specialty comments available.  Procedures:  No procedures performed Allergies: Epinephrine; Clopidogrel bisulfate; and Oxycodone   Assessment /  Plan:     Visit Diagnoses: No diagnosis found.   Orders: No orders of the defined types were placed in this encounter.  No orders of the defined types were placed in this encounter.   Face-to-face time spent with patient was *** minutes. Greater than 50% of time was spent in counseling and coordination of care.  Follow-Up Instructions: No follow-ups on file.   Earnestine Mealing, CMA  Note - This record has been created using Editor, commissioning.  Chart creation errors have been sought, but may not always  have been located. Such creation errors do not reflect on  the standard of medical care.

## 2018-02-24 ENCOUNTER — Encounter: Payer: Self-pay | Admitting: Family Medicine

## 2018-02-24 ENCOUNTER — Ambulatory Visit (INDEPENDENT_AMBULATORY_CARE_PROVIDER_SITE_OTHER): Payer: Medicare Other | Admitting: Family Medicine

## 2018-02-24 VITALS — BP 132/90 | Temp 97.5°F | Ht 63.0 in | Wt 153.4 lb

## 2018-02-24 DIAGNOSIS — J019 Acute sinusitis, unspecified: Secondary | ICD-10-CM | POA: Diagnosis not present

## 2018-02-24 DIAGNOSIS — B9689 Other specified bacterial agents as the cause of diseases classified elsewhere: Secondary | ICD-10-CM

## 2018-02-24 DIAGNOSIS — I2510 Atherosclerotic heart disease of native coronary artery without angina pectoris: Secondary | ICD-10-CM

## 2018-02-24 MED ORDER — AZITHROMYCIN 250 MG PO TABS: ORAL_TABLET | ORAL | 0 refills | Status: DC

## 2018-02-24 NOTE — Patient Instructions (Signed)
If fevers,wheezing or worse please call  Your medicine was sent into [facility]

## 2018-02-24 NOTE — Progress Notes (Signed)
   Subjective:    Patient ID: Brenda Hall, female    DOB: Aug 17, 1930, 84 y.o.   MRN: [id number]  Cough  This is a new problem. The current episode started yesterday. The cough is productive of sputum. Associated symptoms include headaches, rhinorrhea and a sore throat. Pertinent negatives include no chest pain, ear pain, fever, shortness of breath or wheezing. Associated symptoms comments: Congestion, aches, diarrhea. Treatments tried: Tylenol Sinus, Cough syrup  The treatment provided mild relief.   Viral-like illness for several days now with head congestion sinus pressure denies chest tightness pressure pain sweats or chills   Review of Systems  Constitutional: Negative for activity change and fever.  HENT: Positive for congestion, rhinorrhea and sore throat. Negative for ear pain.   Eyes: Negative for discharge.  Respiratory: Positive for cough. Negative for shortness of breath and wheezing.   Cardiovascular: Negative for chest pain.  Neurological: Positive for headaches.       Objective:   Physical Exam  Constitutional: She appears well-developed.  HENT:  Head: Normocephalic.  Nose: Nose normal.  Mouth/Throat: Oropharynx is clear and moist. No oropharyngeal exudate.  Neck: Neck supple.  Cardiovascular: Normal rate and normal heart sounds.  No murmur heard. Pulmonary/Chest: Effort normal and breath sounds normal. She has no wheezes.  Lymphadenopathy:    She has no cervical adenopathy.  Skin: Skin is warm and dry.  Nursing note and vitals reviewed.         Assessment & Plan:  Viral respiratory illness with secondary rhinosinusitis antibiotic prescribed warning signs discussed Patient states she is done well in the past with Z-Pak and requested a prescription for this Patient was told if her chest starts becoming congested or if she starts having fever occurs then to delay her facial surgery later this week

## 2018-02-26 DIAGNOSIS — H04123 Dry eye syndrome of bilateral lacrimal glands: Secondary | ICD-10-CM | POA: Diagnosis not present

## 2018-02-26 DIAGNOSIS — H02532 Eyelid retraction right lower eyelid: Secondary | ICD-10-CM | POA: Diagnosis not present

## 2018-02-26 DIAGNOSIS — H04523 Eversion of bilateral lacrimal punctum: Secondary | ICD-10-CM | POA: Diagnosis not present

## 2018-02-26 DIAGNOSIS — H16213 Exposure keratoconjunctivitis, bilateral: Secondary | ICD-10-CM | POA: Diagnosis not present

## 2018-02-26 DIAGNOSIS — H16211 Exposure keratoconjunctivitis, right eye: Secondary | ICD-10-CM | POA: Diagnosis not present

## 2018-02-26 DIAGNOSIS — Z09 Encounter for follow-up examination after completed treatment for conditions other than malignant neoplasm: Secondary | ICD-10-CM | POA: Diagnosis not present

## 2018-02-26 DIAGNOSIS — H02132 Senile ectropion of right lower eyelid: Secondary | ICD-10-CM | POA: Diagnosis not present

## 2018-02-26 DIAGNOSIS — H04522 Eversion of left lacrimal punctum: Secondary | ICD-10-CM | POA: Diagnosis not present

## 2018-02-26 DIAGNOSIS — H04521 Eversion of right lacrimal punctum: Secondary | ICD-10-CM | POA: Diagnosis not present

## 2018-02-26 DIAGNOSIS — H11821 Conjunctivochalasis, right eye: Secondary | ICD-10-CM | POA: Diagnosis not present

## 2018-02-26 DIAGNOSIS — H04561 Stenosis of right lacrimal punctum: Secondary | ICD-10-CM | POA: Diagnosis not present

## 2018-02-26 DIAGNOSIS — H16212 Exposure keratoconjunctivitis, left eye: Secondary | ICD-10-CM | POA: Diagnosis not present

## 2018-02-26 DIAGNOSIS — H02112 Cicatricial ectropion of right lower eyelid: Secondary | ICD-10-CM | POA: Diagnosis not present

## 2018-03-02 ENCOUNTER — Other Ambulatory Visit: Payer: Self-pay

## 2018-03-02 ENCOUNTER — Encounter (HOSPITAL_COMMUNITY): Payer: Self-pay | Admitting: *Deleted

## 2018-03-02 ENCOUNTER — Inpatient Hospital Stay (HOSPITAL_COMMUNITY)
Admission: EM | Admit: 2018-03-02 | Discharge: 2018-03-13 | DRG: 065 | Disposition: A | Discharge status: Skilled Nursing Facility | Payer: Medicare Other | Attending: Family Medicine | Admitting: Family Medicine

## 2018-03-02 ENCOUNTER — Telehealth: Payer: Self-pay | Admitting: Family Medicine

## 2018-03-02 ENCOUNTER — Emergency Department (HOSPITAL_COMMUNITY): Payer: Medicare Other

## 2018-03-02 DIAGNOSIS — K589 Irritable bowel syndrome without diarrhea: Secondary | ICD-10-CM | POA: Diagnosis present

## 2018-03-02 DIAGNOSIS — M6281 Muscle weakness (generalized): Secondary | ICD-10-CM | POA: Diagnosis not present

## 2018-03-02 DIAGNOSIS — I63411 Cerebral infarction due to embolism of right middle cerebral artery: Principal | ICD-10-CM | POA: Diagnosis present

## 2018-03-02 DIAGNOSIS — E785 Hyperlipidemia, unspecified: Secondary | ICD-10-CM | POA: Diagnosis present

## 2018-03-02 DIAGNOSIS — N183 Chronic kidney disease, stage 3 unspecified: Secondary | ICD-10-CM | POA: Diagnosis present

## 2018-03-02 DIAGNOSIS — Z7901 Long term (current) use of anticoagulants: Secondary | ICD-10-CM | POA: Diagnosis not present

## 2018-03-02 DIAGNOSIS — R471 Dysarthria and anarthria: Secondary | ICD-10-CM | POA: Diagnosis present

## 2018-03-02 DIAGNOSIS — G8929 Other chronic pain: Secondary | ICD-10-CM | POA: Diagnosis present

## 2018-03-02 DIAGNOSIS — E559 Vitamin D deficiency, unspecified: Secondary | ICD-10-CM | POA: Diagnosis not present

## 2018-03-02 DIAGNOSIS — G629 Polyneuropathy, unspecified: Secondary | ICD-10-CM | POA: Diagnosis not present

## 2018-03-02 DIAGNOSIS — Z741 Need for assistance with personal care: Secondary | ICD-10-CM | POA: Diagnosis not present

## 2018-03-02 DIAGNOSIS — R05 Cough: Secondary | ICD-10-CM | POA: Diagnosis not present

## 2018-03-02 DIAGNOSIS — R1312 Dysphagia, oropharyngeal phase: Secondary | ICD-10-CM | POA: Diagnosis not present

## 2018-03-02 DIAGNOSIS — I252 Old myocardial infarction: Secondary | ICD-10-CM | POA: Diagnosis not present

## 2018-03-02 DIAGNOSIS — M7989 Other specified soft tissue disorders: Secondary | ICD-10-CM | POA: Diagnosis not present

## 2018-03-02 DIAGNOSIS — I2510 Atherosclerotic heart disease of native coronary artery without angina pectoris: Secondary | ICD-10-CM | POA: Diagnosis present

## 2018-03-02 DIAGNOSIS — R279 Unspecified lack of coordination: Secondary | ICD-10-CM | POA: Diagnosis not present

## 2018-03-02 DIAGNOSIS — Z96653 Presence of artificial knee joint, bilateral: Secondary | ICD-10-CM | POA: Diagnosis present

## 2018-03-02 DIAGNOSIS — Z8673 Personal history of transient ischemic attack (TIA), and cerebral infarction without residual deficits: Secondary | ICD-10-CM

## 2018-03-02 DIAGNOSIS — M810 Age-related osteoporosis without current pathological fracture: Secondary | ICD-10-CM | POA: Diagnosis present

## 2018-03-02 DIAGNOSIS — R4781 Slurred speech: Secondary | ICD-10-CM | POA: Diagnosis not present

## 2018-03-02 DIAGNOSIS — I25118 Atherosclerotic heart disease of native coronary artery with other forms of angina pectoris: Secondary | ICD-10-CM | POA: Diagnosis not present

## 2018-03-02 DIAGNOSIS — M47816 Spondylosis without myelopathy or radiculopathy, lumbar region: Secondary | ICD-10-CM | POA: Diagnosis not present

## 2018-03-02 DIAGNOSIS — M353 Polymyalgia rheumatica: Secondary | ICD-10-CM | POA: Diagnosis present

## 2018-03-02 DIAGNOSIS — I10 Essential (primary) hypertension: Secondary | ICD-10-CM | POA: Diagnosis present

## 2018-03-02 DIAGNOSIS — I4891 Unspecified atrial fibrillation: Secondary | ICD-10-CM | POA: Diagnosis present

## 2018-03-02 DIAGNOSIS — I63511 Cerebral infarction due to unspecified occlusion or stenosis of right middle cerebral artery: Secondary | ICD-10-CM | POA: Diagnosis present

## 2018-03-02 DIAGNOSIS — I358 Other nonrheumatic aortic valve disorders: Secondary | ICD-10-CM | POA: Diagnosis present

## 2018-03-02 DIAGNOSIS — R1311 Dysphagia, oral phase: Secondary | ICD-10-CM | POA: Diagnosis present

## 2018-03-02 DIAGNOSIS — I959 Hypotension, unspecified: Secondary | ICD-10-CM | POA: Diagnosis not present

## 2018-03-02 DIAGNOSIS — I129 Hypertensive chronic kidney disease with stage 1 through stage 4 chronic kidney disease, or unspecified chronic kidney disease: Secondary | ICD-10-CM | POA: Diagnosis present

## 2018-03-02 DIAGNOSIS — R531 Weakness: Secondary | ICD-10-CM | POA: Diagnosis not present

## 2018-03-02 DIAGNOSIS — D72829 Elevated white blood cell count, unspecified: Secondary | ICD-10-CM | POA: Diagnosis present

## 2018-03-02 DIAGNOSIS — R29818 Other symptoms and signs involving the nervous system: Secondary | ICD-10-CM | POA: Diagnosis not present

## 2018-03-02 DIAGNOSIS — Z79890 Hormone replacement therapy (postmenopausal): Secondary | ICD-10-CM | POA: Diagnosis not present

## 2018-03-02 DIAGNOSIS — G8194 Hemiplegia, unspecified affecting left nondominant side: Secondary | ICD-10-CM | POA: Diagnosis present

## 2018-03-02 DIAGNOSIS — R404 Transient alteration of awareness: Secondary | ICD-10-CM | POA: Diagnosis not present

## 2018-03-02 DIAGNOSIS — R0689 Other abnormalities of breathing: Secondary | ICD-10-CM | POA: Diagnosis not present

## 2018-03-02 DIAGNOSIS — I480 Paroxysmal atrial fibrillation: Secondary | ICD-10-CM | POA: Diagnosis not present

## 2018-03-02 DIAGNOSIS — I82612 Acute embolism and thrombosis of superficial veins of left upper extremity: Secondary | ICD-10-CM | POA: Diagnosis present

## 2018-03-02 DIAGNOSIS — Z8052 Family history of malignant neoplasm of bladder: Secondary | ICD-10-CM

## 2018-03-02 DIAGNOSIS — Z981 Arthrodesis status: Secondary | ICD-10-CM | POA: Diagnosis not present

## 2018-03-02 DIAGNOSIS — R4182 Altered mental status, unspecified: Secondary | ICD-10-CM | POA: Diagnosis present

## 2018-03-02 DIAGNOSIS — Z8249 Family history of ischemic heart disease and other diseases of the circulatory system: Secondary | ICD-10-CM

## 2018-03-02 DIAGNOSIS — I4819 Other persistent atrial fibrillation: Secondary | ICD-10-CM | POA: Diagnosis not present

## 2018-03-02 DIAGNOSIS — R29708 NIHSS score 8: Secondary | ICD-10-CM | POA: Diagnosis present

## 2018-03-02 DIAGNOSIS — R21 Rash and other nonspecific skin eruption: Secondary | ICD-10-CM | POA: Diagnosis not present

## 2018-03-02 DIAGNOSIS — Z794 Long term (current) use of insulin: Secondary | ICD-10-CM

## 2018-03-02 DIAGNOSIS — M19042 Primary osteoarthritis, left hand: Secondary | ICD-10-CM | POA: Diagnosis not present

## 2018-03-02 DIAGNOSIS — D649 Anemia, unspecified: Secondary | ICD-10-CM | POA: Diagnosis not present

## 2018-03-02 DIAGNOSIS — R410 Disorientation, unspecified: Secondary | ICD-10-CM | POA: Diagnosis not present

## 2018-03-02 DIAGNOSIS — R059 Cough, unspecified: Secondary | ICD-10-CM

## 2018-03-02 DIAGNOSIS — I495 Sick sinus syndrome: Secondary | ICD-10-CM

## 2018-03-02 DIAGNOSIS — I69392 Facial weakness following cerebral infarction: Secondary | ICD-10-CM | POA: Diagnosis not present

## 2018-03-02 DIAGNOSIS — I639 Cerebral infarction, unspecified: Secondary | ICD-10-CM | POA: Diagnosis not present

## 2018-03-02 DIAGNOSIS — Z85820 Personal history of malignant melanoma of skin: Secondary | ICD-10-CM

## 2018-03-02 DIAGNOSIS — I272 Pulmonary hypertension, unspecified: Secondary | ICD-10-CM | POA: Diagnosis present

## 2018-03-02 DIAGNOSIS — M2550 Pain in unspecified joint: Secondary | ICD-10-CM | POA: Diagnosis not present

## 2018-03-02 DIAGNOSIS — Z7401 Bed confinement status: Secondary | ICD-10-CM | POA: Diagnosis not present

## 2018-03-02 DIAGNOSIS — M545 Low back pain: Secondary | ICD-10-CM | POA: Diagnosis present

## 2018-03-02 DIAGNOSIS — Z7951 Long term (current) use of inhaled steroids: Secondary | ICD-10-CM

## 2018-03-02 DIAGNOSIS — R29810 Facial weakness: Secondary | ICD-10-CM | POA: Diagnosis present

## 2018-03-02 DIAGNOSIS — Z7952 Long term (current) use of systemic steroids: Secondary | ICD-10-CM

## 2018-03-02 DIAGNOSIS — I6523 Occlusion and stenosis of bilateral carotid arteries: Secondary | ICD-10-CM | POA: Diagnosis not present

## 2018-03-02 DIAGNOSIS — M19041 Primary osteoarthritis, right hand: Secondary | ICD-10-CM | POA: Diagnosis not present

## 2018-03-02 LAB — URINALYSIS, ROUTINE W REFLEX MICROSCOPIC
BILIRUBIN URINE: NEGATIVE
GLUCOSE, UA: NEGATIVE mg/dL
Hgb urine dipstick: NEGATIVE
Ketones, ur: NEGATIVE mg/dL
LEUKOCYTES UA: NEGATIVE
NITRITE: NEGATIVE
Protein, ur: NEGATIVE mg/dL
SPECIFIC GRAVITY, URINE: 1.023 (ref 1.005–1.030)
pH: 8 (ref 5.0–8.0)

## 2018-03-02 LAB — COMPREHENSIVE METABOLIC PANEL
ALBUMIN: 3.6 g/dL (ref 3.5–5.0)
ALK PHOS: 83 U/L (ref 38–126)
ALT: 12 U/L (ref 0–44)
AST: 20 U/L (ref 15–41)
Anion gap: 9 (ref 5–15)
BILIRUBIN TOTAL: 0.9 mg/dL (ref 0.3–1.2)
BUN: 13 mg/dL (ref 8–23)
CALCIUM: 8.9 mg/dL (ref 8.9–10.3)
CO2: 26 mmol/L (ref 22–32)
Chloride: 103 mmol/L (ref 98–111)
Creatinine, Ser: 1.2 mg/dL — ABNORMAL HIGH (ref 0.44–1.00)
GFR calc Af Amer: 46 mL/min — ABNORMAL LOW (ref >60)
GFR calc non Af Amer: 40 mL/min — ABNORMAL LOW (ref >60)
Glucose, Bld: 117 mg/dL — ABNORMAL HIGH (ref 70–99)
Potassium: 4.1 mmol/L (ref 3.5–5.1)
SODIUM: 138 mmol/L (ref 135–145)
Total Protein: 7.6 g/dL (ref 6.5–8.1)

## 2018-03-02 LAB — CBC
HCT: 38 % (ref 36.0–46.0)
Hemoglobin: 11.3 g/dL — ABNORMAL LOW (ref 12.0–15.0)
MCH: 26.7 pg (ref 26.0–34.0)
MCHC: 29.7 g/dL — ABNORMAL LOW (ref 30.0–36.0)
MCV: 89.6 fL (ref 80.0–100.0)
NRBC: 0 % (ref 0.0–0.2)
Platelets: 381 10*3/uL (ref 150–400)
RBC: 4.24 MIL/uL (ref 3.87–5.11)
RDW: 15.6 % — AB (ref 11.5–15.5)
WBC: 7.7 10*3/uL (ref 4.0–10.5)

## 2018-03-02 LAB — DIFFERENTIAL
ABS IMMATURE GRANULOCYTES: 0.02 10*3/uL (ref 0.00–0.07)
Basophils Absolute: 0.1 10*3/uL (ref 0.0–0.1)
Basophils Relative: 1 %
Eosinophils Absolute: 0.2 10*3/uL (ref 0.0–0.5)
Eosinophils Relative: 2 %
IMMATURE GRANULOCYTES: 0 %
LYMPHS ABS: 1.7 10*3/uL (ref 0.7–4.0)
Lymphocytes Relative: 23 %
Monocytes Absolute: 0.5 10*3/uL (ref 0.1–1.0)
Monocytes Relative: 7 %
NEUTROS ABS: 5.2 10*3/uL (ref 1.7–7.7)
Neutrophils Relative %: 67 %

## 2018-03-02 LAB — ETHANOL: Alcohol, Ethyl (B): <10 mg/dL (ref <10)

## 2018-03-02 LAB — I-STAT CHEM 8, ED
BUN: 14 mg/dL (ref 8–23)
CALCIUM ION: 1.13 mmol/L — AB (ref 1.15–1.40)
Chloride: 103 mmol/L (ref 98–111)
Creatinine, Ser: 1.2 mg/dL — ABNORMAL HIGH (ref 0.44–1.00)
GLUCOSE: 114 mg/dL — AB (ref 70–99)
HCT: 38 % (ref 36.0–46.0)
Hemoglobin: 12.9 g/dL (ref 12.0–15.0)
Potassium: 4.4 mmol/L (ref 3.5–5.1)
SODIUM: 140 mmol/L (ref 135–145)
TCO2: 29 mmol/L (ref 22–32)

## 2018-03-02 LAB — PROTIME-INR
INR: 1.73
PROTHROMBIN TIME: 20.1 s — AB (ref 11.4–15.2)

## 2018-03-02 LAB — RAPID URINE DRUG SCREEN, HOSP PERFORMED
AMPHETAMINES: NOT DETECTED
Barbiturates: NOT DETECTED
Benzodiazepines: NOT DETECTED
COCAINE: NOT DETECTED
Opiates: NOT DETECTED
Tetrahydrocannabinol: NOT DETECTED

## 2018-03-02 LAB — APTT: aPTT: 29 seconds (ref 24–36)

## 2018-03-02 LAB — CBG MONITORING, ED: Glucose-Capillary: 112 mg/dL — ABNORMAL HIGH (ref 70–99)

## 2018-03-02 LAB — I-STAT TROPONIN, ED: Troponin i, poc: 0 ng/mL (ref 0.00–0.08)

## 2018-03-02 MED ORDER — ACETAMINOPHEN 325 MG PO TABS
650.0000 mg | ORAL_TABLET | ORAL | Status: DC | PRN
  Administered 2018-03-06: 650 mg via ORAL
  Filled 2018-03-02: qty 2

## 2018-03-02 MED ORDER — ASPIRIN 325 MG PO TABS
325.0000 mg | ORAL_TABLET | Freq: Every day | ORAL | Status: DC
  Administered 2018-03-05 – 2018-03-12 (×8): 325 mg via ORAL
  Filled 2018-03-02 (×8): qty 1

## 2018-03-02 MED ORDER — ASPIRIN 300 MG RE SUPP
300.0000 mg | Freq: Every day | RECTAL | Status: DC
  Administered 2018-03-03 – 2018-03-04 (×2): 300 mg via RECTAL
  Filled 2018-03-02 (×3): qty 1

## 2018-03-02 MED ORDER — AMOXICILLIN-POT CLAVULANATE 875-125 MG PO TABS: 1.0000 | ORAL_TABLET | Freq: Two times a day (BID) | ORAL | 0 refills | Status: AC

## 2018-03-02 MED ORDER — HYDRALAZINE HCL 20 MG/ML IJ SOLN
10.0000 mg | Freq: Four times a day (QID) | INTRAMUSCULAR | Status: DC | PRN
  Administered 2018-03-05: 10 mg via INTRAVENOUS
  Filled 2018-03-02: qty 1

## 2018-03-02 MED ORDER — ATORVASTATIN CALCIUM 80 MG PO TABS
80.0000 mg | ORAL_TABLET | Freq: Every day | ORAL | Status: DC
  Administered 2018-03-05 – 2018-03-13 (×9): 80 mg via ORAL
  Filled 2018-03-02 (×9): qty 1

## 2018-03-02 MED ORDER — HEPARIN SODIUM (PORCINE) 5000 UNIT/ML IJ SOLN
5000.0000 [IU] | Freq: Three times a day (TID) | INTRAMUSCULAR | Status: DC
  Administered 2018-03-03 – 2018-03-12 (×30): 5000 [IU] via SUBCUTANEOUS
  Filled 2018-03-02 (×30): qty 1

## 2018-03-02 MED ORDER — SODIUM CHLORIDE 0.9 % IV SOLN
INTRAVENOUS | Status: AC
  Administered 2018-03-03: via INTRAVENOUS

## 2018-03-02 MED ORDER — SENNOSIDES-DOCUSATE SODIUM 8.6-50 MG PO TABS
1.0000 | ORAL_TABLET | Freq: Every evening | ORAL | Status: DC | PRN
  Filled 2018-03-02 (×2): qty 1

## 2018-03-02 MED ORDER — STROKE: EARLY STAGES OF RECOVERY BOOK
Freq: Once | Status: AC
  Administered 2018-03-03: 1
  Filled 2018-03-02 (×2): qty 1

## 2018-03-02 MED ORDER — PANTOPRAZOLE SODIUM 40 MG PO TBEC
40.0000 mg | DELAYED_RELEASE_TABLET | Freq: Every morning | ORAL | Status: DC
  Administered 2018-03-05 – 2018-03-13 (×9): 40 mg via ORAL
  Filled 2018-03-02 (×9): qty 1

## 2018-03-02 MED ORDER — ACETAMINOPHEN 160 MG/5ML PO SOLN: 650.0000 mg | ORAL | Status: DC | PRN

## 2018-03-02 MED ORDER — ACETAMINOPHEN 650 MG RE SUPP
650.0000 mg | RECTAL | Status: DC | PRN
  Administered 2018-03-03: 650 mg via RECTAL
  Filled 2018-03-02: qty 1

## 2018-03-02 MED ORDER — IOPAMIDOL (ISOVUE-370) INJECTION 76%
150.0000 mL | Freq: Once | INTRAVENOUS | Status: AC | PRN
  Administered 2018-03-02: 92 mL via INTRAVENOUS

## 2018-03-02 NOTE — ED Notes (Signed)
Family at bedside. Able to report that pt spoke with nurse at 1030 and was coherent

## 2018-03-02 NOTE — ED Provider Notes (Signed)
[facility] EMERGENCY DEPARTMENT Provider Note   CSN: [id number] Arrival date & time: 03/02/18  1720     History   Chief Complaint Chief Complaint  Patient presents with  . Code Stroke    HPI Brenda Hall is a 84 y.o. female.  HPI   She presents for evaluation of left-sided weakness with difficulty speaking which reportedly started 9 AM, according to EMS.  MS got this report from the patient's family member, grandson, who was at home with her.  On arrival at the ED, the patient is alone.  Patient is cooperative but a poor historian.  She remembers having right eye surgery last week for a droopy lid.   Level 5 caveat-altered mental status  Past Medical History:  Diagnosis Date  . Anemia   . CAD in native artery    a. NSTEMI 12/2005 s/p BMS to Cx, prior normal LVEF.  Marland Kitchen Cancer ([place]) 1988 and  2009   BCC nose and  Melanoma back  . Cancer of skin Jan. 2014   [facility] Forehead and Nose  . Carotid stenosis    mild  . Chronic back pain   . CKD (chronic kidney disease), stage III ([place])   . Coronary atherosclerosis of unspecified type of vessel, native or graft   . DJD (degenerative joint disease) of knee   . History of TIA (transient ischemic attack)   . HTN (hypertension)   . IBS (irritable bowel syndrome)   . Myocardial infarction [facility])    5 yrs ago     dr wall  cardiac  . Osteoporosis   . Other diseases of lung, not elsewhere classified    lung nodule  . Polymyalgia rheumatica ([place])   . Shingles 01/2016-02/2016  . Stroke [facility])     Patient Active Problem List   Diagnosis Date Noted  . Cerebrovascular accident (CVA) due to occlusion of right middle cerebral artery ([place]) 03/02/2018  . Atrial fibrillation with RVR ([place]) 10/24/2017  . Nausea 10/24/2017  . Nausea 10/24/2017  . Acute bilateral low back pain without sciatica   . E. coli UTI   . Community acquired pneumonia   . L1 vertebral fracture ([place]) 09/27/2017  . UTI (urinary tract infection) 09/27/2017  .  CKD (chronic kidney disease), stage III ([place])   . History of TIA (transient ischemic attack)   . PAF (paroxysmal atrial fibrillation) ([place])   . Atrial fibrillation with rapid ventricular response ([place]) 02/12/2017  . Hyperlipidemia 02/12/2017  . Pulmonary edema 02/12/2017  . Urgency of micturition 02/12/2017  . Osteopenia of multiple sites 07/23/2016  . Age-related osteoporosis without current pathological fracture 07/11/2016  . History of total knee replacement, bilateral 07/11/2016  . History of COPD 07/11/2016  . Spondylosis of lumbar region without myelopathy or radiculopathy 07/11/2016  . Primary osteoarthritis of both hands 07/11/2016  . Primary osteoarthritis of both feet 07/11/2016  . Vitamin D deficiency 07/11/2016  . History of vertebral fracture 07/11/2016  . History of IBS 07/11/2016  . Basal cell carcinoma 07/11/2016  . History of CVA (cerebrovascular accident)/ has metal implant not MRI safe  07/11/2016  . Other and unspecified hyperlipidemia 11/19/2012  . Cerebrovascular accident (stroke) ([place]) 11/19/2012  . Difficulty in walking(719.7) 11/06/2012  . Occlusion and stenosis of carotid artery without mention of cerebral infarction 02/07/2012  . OLD MYOCARDIAL INFARCTION 11/07/2009  . CAD, NATIVE VESSEL 11/07/2009  . Essential hypertension 09/28/2008  . IRRITABLE BOWEL SYNDROME 09/28/2008  . DEGENERATIVE JOINT DISEASE, KNEE 09/28/2008  . POLYMYALGIA  RHEUMATICA 09/28/2008  . Chest pain, unspecified 09/28/2008    Past Surgical History:  Procedure Laterality Date  . ABDOMINAL HYSTERECTOMY  1977   partial  . BACK SURGERY  1982  . CARDIAC CATHETERIZATION    . COLONOSCOPY N/A 01/27/2013   Procedure: COLONOSCOPY;  Surgeon: Rogene Houston, MD;  Location: [facility] SUITE;  Service: Endoscopy;  Laterality: N/A;  1030  . EYE SURGERY Bilateral 04/2017  . EYE SURGERY Right 09/23/2017  . IR KYPHO LUMBAR INC FX REDUCE BONE BX UNI/BIL CANNULATION INC/IMAGING  09/30/2017  . IR  VERTEBROPLASTY CERV/THOR BX INC UNI/BIL INC/INJECT/IMAGING  09/05/2017  . JOINT REPLACEMENT  2001 and  2011   6 knee surgeries   bil replacement  . KNEE ARTHROPLASTY    . KNEE ARTHROSCOPY    . LUMBAR LAMINECTOMY    . PR VEIN BYPASS GRAFT,AORTO-FEM-POP  2010  . removal of melanoma from back  2007  . SPINE SURGERY  2013   Broken back: fusion surgery  . tonisllectomy  1944  . total knee anthroplasty    . VERTEBROPLASTY  07/11/2011   Procedure: VERTEBROPLASTY;  Surgeon: Faythe Ghee, MD;  Location: [facility];  Service: Neurosurgery;  Laterality: N/A;  Vertebroplasty of Lumbar Two     OB History   None      Home Medications    Prior to Admission medications   Medication Sig Start Date End Date Taking? Authorizing Provider  Abaloparatide (TYMLOS) 3120 MCG/1.56ML SOPN Inject 80 mcg into the skin daily. 12/05/17   Bo Merino, MD  acetaminophen (TYLENOL) 500 MG tablet Take 500 mg by mouth every 6 (six) hours as needed for mild pain.    [provider]  amLODipine (NORVASC) 5 MG tablet Take 5 mg by mouth daily.    [provider]  amoxicillin-clavulanate (AUGMENTIN) 875-125 MG tablet Take 1 tablet by mouth 2 (two) times daily for 10 days. 03/02/18 03/12/18  [name], MD  aspirin 81 MG tablet Take 81 mg by mouth daily.    [provider]  atorvastatin (LIPITOR) 80 MG tablet Take 1 tablet (80 mg total) by mouth at bedtime. 01/27/18   [name], MD  azithromycin (ZITHROMAX Z-PAK) 250 MG tablet Take 2 tablets (500 mg) on  Day 1,  followed by 1 tablet (250 mg) once daily on Days 2 through 5. 02/24/18   Luking, Elayne Snare, MD  Calcium Carbonate-Vit D-Min (CALCIUM 1200 PO) Take 1 tablet by mouth at bedtime.     [provider]  estrogens, conjugated, (PREMARIN) 0.3 MG tablet Take 1 tablet (0.3 mg total) by mouth every evening. 02/14/17   Johnson, Clanford L, MD  fluticasone (FLONASE) 50 MCG/ACT nasal spray USE TWO SPRAYS IN EACH NOSTRIL ONCE  DAILY Patient taking differently: Place 1 spray into both nostrils daily as needed for allergies. USE TWO SPRAYS IN EACH NOSTRIL ONCE DAILY 12/03/16   [name], MD  Insulin Pen Needle (PEN NEEDLES) 30G X 8 MM MISC 1 each by Does not apply route daily. Use one pen needle daily to inject Tymlos. 12/17/17   Bo Merino, MD  lidocaine (LIDODERM) 5 % Place 1 patch onto the skin daily. Remove & Discard patch within 12 hours or as directed by MD 10/02/17   Eugenie Filler, MD  loperamide (IMODIUM) 2 MG capsule Take 2 mg by mouth daily as needed for diarrhea or loose stools.     [provider]  methocarbamol (ROBAXIN) 500 MG tablet Take  2 tablets (1,000 mg total) by mouth every 8 (eight) hours as needed for muscle spasms. 10/02/17   Eugenie Filler, MD  metoprolol succinate (TOPROL-XL) 50 MG 24 hr tablet Take 75 mg by mouth daily. Take with or immediately following a meal.    [provider]  nitroGLYCERIN (NITROSTAT) 0.4 MG SL tablet Place 1 tablet (0.4 mg total) under the tongue every 5 (five) minutes as needed for chest pain. 12/03/16   [name], MD  pantoprazole (PROTONIX) 40 MG tablet TAKE 1 TABLET EVERY MORNING 10/20/17   Bo Merino, MD  predniSONE (DELTASONE) 1 MG tablet Take 3 tablets (3 mg total) by mouth daily with breakfast. Patient taking differently: Take 2 mg by mouth daily with breakfast.  01/09/18   Bo Merino, MD  PREMARIN 0.3 MG tablet TAKE 1 TABLET DAILY FOR 21 DAYS, THEN DO NOT TAKE FOR 7 DAYS 02/10/18   [name], MD  XARELTO 15 MG TABS tablet TAKE 1 TABLET DAILY WITH SUPPER 09/30/17   Arnoldo Lenis, MD  Zinc 50 MG CAPS Take 50 mg by mouth daily.    [provider]    Family History Family History  Problem Relation Age of Onset  . Heart disease Father   . Hypertension Father   . Heart attack Father   . Cancer Mother        bladder   . Hypertension Daughter   . Atrial fibrillation Daughter   .  Hypertension Son   . Colon cancer Neg Hx     Social History Social History   Tobacco Use  . Smoking status: Never Smoker  . Smokeless tobacco: Never Used  . Tobacco comment: tobacco use - no  Substance Use Topics  . Alcohol use: Yes    Alcohol/week: 0.0 standard drinks    Comment: occasionally a glass of wine or margarita per pt  . Drug use: No     Allergies   Epinephrine; Clopidogrel bisulfate; and Oxycodone   Review of Systems Review of Systems  Unable to perform ROS: Mental status change     Physical Exam Updated Vital Signs BP (!) 178/85   Pulse 98   Resp (!) 25   Ht 5\' 5"  (1.651 m)   Wt 69.6 kg   SpO2 97%   BMI 25.53 kg/m   Physical Exam  Constitutional: She appears well-developed and well-nourished.  HENT:  Head: Normocephalic and atraumatic.  Eyes: Pupils are equal, round, and reactive to light. Conjunctivae and EOM are normal.  Right eye with some conjunctival ecchymosis ecchymosis, consistent with recent surgery.  Pupils are reactive.  Extraocular muscles are intact.  Neck: Normal range of motion and phonation normal. Neck supple.  Cardiovascular: Normal rate and regular rhythm.  Pulmonary/Chest: Effort normal and breath sounds normal. She exhibits no tenderness.  Abdominal: Soft. She exhibits no distension. There is no tenderness. There is no guarding.  Musculoskeletal: Normal range of motion.  Neurological: She is alert. She exhibits normal muscle tone.  She has facial asymmetry, with droop left side however she is able to raise the left cheek somewhat.  Unclear if this is a chronic condition.  She is able to lift both arms above shoulder height and legs above hips while supine.  Skin: Skin is warm and dry. No erythema.  Psychiatric: She has a normal mood and affect. Her behavior is normal.  Nursing note and vitals reviewed.    ED Treatments / Results  Labs (all labs ordered are listed,  but only abnormal results are displayed) Labs Reviewed    PROTIME-INR - Abnormal; Notable for the following components:      Result Value   Prothrombin Time 20.1 (*)    All other components within normal limits  CBC - Abnormal; Notable for the following components:   Hemoglobin 11.3 (*)    MCHC 29.7 (*)    RDW 15.6 (*)    All other components within normal limits  COMPREHENSIVE METABOLIC PANEL - Abnormal; Notable for the following components:   Glucose, Bld 117 (*)    Creatinine, Ser 1.20 (*)    GFR calc non Af Amer 40 (*)    GFR calc Af Amer 46 (*)    All other components within normal limits  URINALYSIS, ROUTINE W REFLEX MICROSCOPIC - Abnormal; Notable for the following components:   APPearance HAZY (*)    All other components within normal limits  CBG MONITORING, ED - Abnormal; Notable for the following components:   Glucose-Capillary 112 (*)    All other components within normal limits  I-STAT CHEM 8, ED - Abnormal; Notable for the following components:   Creatinine, Ser 1.20 (*)    Glucose, Bld 114 (*)    Calcium, Ion 1.13 (*)    All other components within normal limits  ETHANOL  APTT  DIFFERENTIAL  RAPID URINE DRUG SCREEN, HOSP PERFORMED  I-STAT TROPONIN, ED    EKG None  Radiology Ct Angio Head W Or Wo Contrast  Result Date: 03/02/2018 CLINICAL DATA:  Left-sided weakness EXAM: CT ANGIOGRAPHY HEAD AND NECK CT PERFUSION BRAIN TECHNIQUE: Multidetector CT imaging of the head and neck was performed using the standard protocol during bolus administration of intravenous contrast. Multiplanar CT image reconstructions and MIPs were obtained to evaluate the vascular anatomy. Carotid stenosis measurements (when applicable) are obtained utilizing NASCET criteria, using the distal internal carotid diameter as the denominator. Multiphase CT imaging of the brain was performed following IV bolus contrast injection. Subsequent parametric perfusion maps were calculated using RAPID software. CONTRAST:  21mL ISOVUE-370 IOPAMIDOL (ISOVUE-370)  INJECTION 76% COMPARISON:  None. FINDINGS: CTA NECK FINDINGS SKELETON: There is no bony spinal canal stenosis. No lytic or blastic lesion. OTHER NECK: Normal pharynx, larynx and major salivary glands. No cervical lymphadenopathy. Unremarkable thyroid gland. UPPER CHEST: No pneumothorax or pleural effusion. No nodules or masses. AORTIC ARCH: There is mild calcific atherosclerosis of the aortic arch. There is no aneurysm, dissection or hemodynamically significant stenosis of the visualized ascending aorta and aortic arch. Conventional 3 vessel aortic branching pattern. The visualized proximal subclavian arteries are widely patent. RIGHT CAROTID SYSTEM: --Common carotid artery: Widely patent origin without common carotid artery dissection or aneurysm. --Internal carotid artery: No dissection, occlusion or aneurysm. There is mixed density atherosclerosis extending into the proximal ICA, resulting in less than 50% stenosis. --External carotid artery: No acute abnormality. LEFT CAROTID SYSTEM: --Common carotid artery: Widely patent origin without common carotid artery dissection or aneurysm. --Internal carotid artery: No dissection, occlusion or aneurysm. There is mixed density atherosclerosis extending into the proximal ICA, resulting in less than 50% stenosis. --External carotid artery: No acute abnormality. VERTEBRAL ARTERIES: Left dominant configuration. There is moderate narrowing of both vertebral artery origins. No dissection, occlusion or flow-limiting stenosis to the vertebrobasilar confluence. CTA HEAD FINDINGS ANTERIOR CIRCULATION: --Intracranial internal carotid arteries: Atherosclerotic calcification of the internal carotid arteries at the skull base without hemodynamically significant stenosis. --Anterior cerebral arteries: Normal. Both A1 segments are present. Patent anterior communicating artery. --Middle cerebral arteries: There is  a short segment occlusion of the proximal right M2 segment. The length of  the occluded segment is approximately 5 mm. --Posterior communicating arteries: Present bilaterally. POSTERIOR CIRCULATION: --Basilar artery: Normal. --Posterior cerebral arteries: Normal. --Superior cerebellar arteries: Normal. --Inferior cerebellar arteries: Normal anterior and posterior inferior cerebellar arteries. VENOUS SINUSES: As permitted by contrast timing, patent. ANATOMIC VARIANTS: None DELAYED PHASE: No parenchymal contrast enhancement. Review of the MIP images confirms the above findings. ASPECTS ([place] Stroke Program Early CT Score) - Ganglionic level infarction (caudate, lentiform nuclei, internal capsule, insula, M1-M3 cortex): 5 - Supraganglionic infarction (M4-M6 cortex): 1 Total score (0-10 with 10 being normal): 6 CT Brain Perfusion Findings: CBF (<30%) Volume: 8mL Perfusion (Tmax>6.0s) volume: 15mL Mismatch Volume: 52mL Infarction Location:Posterior right MCA territory IMPRESSION: 1. Short segment occlusion of the right middle cerebral artery proximal M2 segment. 2. 21 mL region of ischemia in the posterior right MCA territory by CT perfusion criteria. The CT perfusion data does not demonstrate a core infarct, but the ASPECT Score is 6 with findings of early infarction in the posterior right MCA territory. 3. Bilateral carotid bifurcation atherosclerosis with less than 50% stenosis. 4.  Aortic atherosclerosis (ICD10-I70.0). These results were called by telephone at the time of interpretation on 03/02/2018 at 6:07 pm to Dr. Daleen Bo , who verbally acknowledged these results. Electronically Signed   By: Ulyses Jarred M.D.   On: 03/02/2018 18:25   Ct Angio Neck W And/or Wo Contrast  Result Date: 03/02/2018 CLINICAL DATA:  Left-sided weakness EXAM: CT ANGIOGRAPHY HEAD AND NECK CT PERFUSION BRAIN TECHNIQUE: Multidetector CT imaging of the head and neck was performed using the standard protocol during bolus administration of intravenous contrast. Multiplanar CT image reconstructions and  MIPs were obtained to evaluate the vascular anatomy. Carotid stenosis measurements (when applicable) are obtained utilizing NASCET criteria, using the distal internal carotid diameter as the denominator. Multiphase CT imaging of the brain was performed following IV bolus contrast injection. Subsequent parametric perfusion maps were calculated using RAPID software. CONTRAST:  48mL ISOVUE-370 IOPAMIDOL (ISOVUE-370) INJECTION 76% COMPARISON:  None. FINDINGS: CTA NECK FINDINGS SKELETON: There is no bony spinal canal stenosis. No lytic or blastic lesion. OTHER NECK: Normal pharynx, larynx and major salivary glands. No cervical lymphadenopathy. Unremarkable thyroid gland. UPPER CHEST: No pneumothorax or pleural effusion. No nodules or masses. AORTIC ARCH: There is mild calcific atherosclerosis of the aortic arch. There is no aneurysm, dissection or hemodynamically significant stenosis of the visualized ascending aorta and aortic arch. Conventional 3 vessel aortic branching pattern. The visualized proximal subclavian arteries are widely patent. RIGHT CAROTID SYSTEM: --Common carotid artery: Widely patent origin without common carotid artery dissection or aneurysm. --Internal carotid artery: No dissection, occlusion or aneurysm. There is mixed density atherosclerosis extending into the proximal ICA, resulting in less than 50% stenosis. --External carotid artery: No acute abnormality. LEFT CAROTID SYSTEM: --Common carotid artery: Widely patent origin without common carotid artery dissection or aneurysm. --Internal carotid artery: No dissection, occlusion or aneurysm. There is mixed density atherosclerosis extending into the proximal ICA, resulting in less than 50% stenosis. --External carotid artery: No acute abnormality. VERTEBRAL ARTERIES: Left dominant configuration. There is moderate narrowing of both vertebral artery origins. No dissection, occlusion or flow-limiting stenosis to the vertebrobasilar confluence. CTA HEAD  FINDINGS ANTERIOR CIRCULATION: --Intracranial internal carotid arteries: Atherosclerotic calcification of the internal carotid arteries at the skull base without hemodynamically significant stenosis. --Anterior cerebral arteries: Normal. Both A1 segments are present. Patent anterior communicating artery. --Middle cerebral arteries: There is a  short segment occlusion of the proximal right M2 segment. The length of the occluded segment is approximately 5 mm. --Posterior communicating arteries: Present bilaterally. POSTERIOR CIRCULATION: --Basilar artery: Normal. --Posterior cerebral arteries: Normal. --Superior cerebellar arteries: Normal. --Inferior cerebellar arteries: Normal anterior and posterior inferior cerebellar arteries. VENOUS SINUSES: As permitted by contrast timing, patent. ANATOMIC VARIANTS: None DELAYED PHASE: No parenchymal contrast enhancement. Review of the MIP images confirms the above findings. ASPECTS ([place] Stroke Program Early CT Score) - Ganglionic level infarction (caudate, lentiform nuclei, internal capsule, insula, M1-M3 cortex): 5 - Supraganglionic infarction (M4-M6 cortex): 1 Total score (0-10 with 10 being normal): 6 CT Brain Perfusion Findings: CBF (<30%) Volume: 40mL Perfusion (Tmax>6.0s) volume: 66mL Mismatch Volume: 27mL Infarction Location:Posterior right MCA territory IMPRESSION: 1. Short segment occlusion of the right middle cerebral artery proximal M2 segment. 2. 21 mL region of ischemia in the posterior right MCA territory by CT perfusion criteria. The CT perfusion data does not demonstrate a core infarct, but the ASPECT Score is 6 with findings of early infarction in the posterior right MCA territory. 3. Bilateral carotid bifurcation atherosclerosis with less than 50% stenosis. 4.  Aortic atherosclerosis (ICD10-I70.0). These results were called by telephone at the time of interpretation on 03/02/2018 at 6:07 pm to Dr. Daleen Bo , who verbally acknowledged these results.  Electronically Signed   By: Ulyses Jarred M.D.   On: 03/02/2018 18:25   Ct Cerebral Perfusion W Contrast  Result Date: 03/02/2018 CLINICAL DATA:  Left-sided weakness EXAM: CT ANGIOGRAPHY HEAD AND NECK CT PERFUSION BRAIN TECHNIQUE: Multidetector CT imaging of the head and neck was performed using the standard protocol during bolus administration of intravenous contrast. Multiplanar CT image reconstructions and MIPs were obtained to evaluate the vascular anatomy. Carotid stenosis measurements (when applicable) are obtained utilizing NASCET criteria, using the distal internal carotid diameter as the denominator. Multiphase CT imaging of the brain was performed following IV bolus contrast injection. Subsequent parametric perfusion maps were calculated using RAPID software. CONTRAST:  62mL ISOVUE-370 IOPAMIDOL (ISOVUE-370) INJECTION 76% COMPARISON:  None. FINDINGS: CTA NECK FINDINGS SKELETON: There is no bony spinal canal stenosis. No lytic or blastic lesion. OTHER NECK: Normal pharynx, larynx and major salivary glands. No cervical lymphadenopathy. Unremarkable thyroid gland. UPPER CHEST: No pneumothorax or pleural effusion. No nodules or masses. AORTIC ARCH: There is mild calcific atherosclerosis of the aortic arch. There is no aneurysm, dissection or hemodynamically significant stenosis of the visualized ascending aorta and aortic arch. Conventional 3 vessel aortic branching pattern. The visualized proximal subclavian arteries are widely patent. RIGHT CAROTID SYSTEM: --Common carotid artery: Widely patent origin without common carotid artery dissection or aneurysm. --Internal carotid artery: No dissection, occlusion or aneurysm. There is mixed density atherosclerosis extending into the proximal ICA, resulting in less than 50% stenosis. --External carotid artery: No acute abnormality. LEFT CAROTID SYSTEM: --Common carotid artery: Widely patent origin without common carotid artery dissection or aneurysm. --Internal  carotid artery: No dissection, occlusion or aneurysm. There is mixed density atherosclerosis extending into the proximal ICA, resulting in less than 50% stenosis. --External carotid artery: No acute abnormality. VERTEBRAL ARTERIES: Left dominant configuration. There is moderate narrowing of both vertebral artery origins. No dissection, occlusion or flow-limiting stenosis to the vertebrobasilar confluence. CTA HEAD FINDINGS ANTERIOR CIRCULATION: --Intracranial internal carotid arteries: Atherosclerotic calcification of the internal carotid arteries at the skull base without hemodynamically significant stenosis. --Anterior cerebral arteries: Normal. Both A1 segments are present. Patent anterior communicating artery. --Middle cerebral arteries: There is a short segment occlusion of  the proximal right M2 segment. The length of the occluded segment is approximately 5 mm. --Posterior communicating arteries: Present bilaterally. POSTERIOR CIRCULATION: --Basilar artery: Normal. --Posterior cerebral arteries: Normal. --Superior cerebellar arteries: Normal. --Inferior cerebellar arteries: Normal anterior and posterior inferior cerebellar arteries. VENOUS SINUSES: As permitted by contrast timing, patent. ANATOMIC VARIANTS: None DELAYED PHASE: No parenchymal contrast enhancement. Review of the MIP images confirms the above findings. ASPECTS ([place] Stroke Program Early CT Score) - Ganglionic level infarction (caudate, lentiform nuclei, internal capsule, insula, M1-M3 cortex): 5 - Supraganglionic infarction (M4-M6 cortex): 1 Total score (0-10 with 10 being normal): 6 CT Brain Perfusion Findings: CBF (<30%) Volume: 77mL Perfusion (Tmax>6.0s) volume: 33mL Mismatch Volume: 87mL Infarction Location:Posterior right MCA territory IMPRESSION: 1. Short segment occlusion of the right middle cerebral artery proximal M2 segment. 2. 21 mL region of ischemia in the posterior right MCA territory by CT perfusion criteria. The CT perfusion data  does not demonstrate a core infarct, but the ASPECT Score is 6 with findings of early infarction in the posterior right MCA territory. 3. Bilateral carotid bifurcation atherosclerosis with less than 50% stenosis. 4.  Aortic atherosclerosis (ICD10-I70.0). These results were called by telephone at the time of interpretation on 03/02/2018 at 6:07 pm to Dr. Daleen Bo , who verbally acknowledged these results. Electronically Signed   By: Ulyses Jarred M.D.   On: 03/02/2018 18:25   Ct Head Code Stroke Wo Contrast  Result Date: 03/02/2018 CLINICAL DATA:  Code stroke.  Left-sided weakness EXAM: CT HEAD WITHOUT CONTRAST TECHNIQUE: Contiguous axial images were obtained from the base of the skull through the vertex without intravenous contrast. COMPARISON:  None. FINDINGS: Brain: There is no mass, hemorrhage or extra-axial collection. The size and configuration of the ventricles and extra-axial CSF spaces are normal. There is hypoattenuation within the posterior right MCA territory suggesting acute or early subacute infarct. No midline shift or other mass effect. 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 Vascular: Mild hyperattenuation within the right MCA M2 segment. Bilateral distal ICA atherosclerotic calcification. Skull: The visualized skull base, calvarium and extracranial soft tissues are normal. Sinuses/Orbits: No fluid levels or advanced mucosal thickening of the visualized paranasal sinuses. No mastoid or middle ear effusion. The orbits are normal. ASPECTS [facility] Stroke Program Early CT Score) - Ganglionic level infarction (caudate, lentiform nuclei, internal capsule, insula, M1-M3 cortex): 5 - Supraganglionic infarction (M4-M6 cortex): 1 Total score (0-10 with 10 being normal): 6 IMPRESSION: 1. No acute hemorrhage. 2. Hypoattenuation in the posterior right MCA territory consistent with acute or early subacute infarct. 3. ASPECTS is 6. These results were  called by telephone at the time of interpretation on 03/02/2018 at 5:47 pm to Dr. Daleen Bo , who verbally acknowledged these results. Electronically Signed   By: Ulyses Jarred M.D.   On: 03/02/2018 17:53    Procedures .Critical Care Performed by: Daleen Bo, MD Authorized by: Daleen Bo, MD   Critical care provider statement:    Critical care time (minutes):  45   Critical care start time:  03/02/2018 5:22 PM   Critical care end time:  03/02/2018 7:13 PM   Critical care time was exclusive of:  Separately billable procedures and treating other patients   Critical care was necessary to treat or prevent imminent or life-threatening deterioration of the following conditions:  CNS failure or compromise   Critical care was time spent personally by me on the following activities:  Blood draw for specimens, development  of treatment plan with patient or surrogate, discussions with consultants, evaluation of patient's response to treatment, examination of patient, obtaining history from patient or surrogate, ordering and performing treatments and interventions, ordering and review of laboratory studies, pulse oximetry, re-evaluation of patient's condition, review of old charts and ordering and review of radiographic studies   (including critical care time)  Medications Ordered in ED Medications  iopamidol (ISOVUE-370) 76 % injection 150 mL (92 mLs Intravenous Contrast Given 03/02/18 1748)     Initial Impression / Assessment and Plan / ED Course  I have reviewed the triage vital signs and the nursing notes.  Pertinent labs & imaging results that were available during my care of the patient were reviewed by me and considered in my medical decision making (see chart for details).  Clinical Course as of Mar 02 1912  Mon Mar 02, 2018  1750 As discussed with radiologist, who sees early defect right MCA region without clot in the MCA.   [EW]  1812 Discussed case with tele-neurology who  was finishing up his consultation with the patient at this time.  The tele-neurologist has canceled the code stroke.  He states that the stroke is evident on plain CT imaging.  I confirmed these findings, with patient's family members.  Initial plans are admission for evaluation of risk factors and stabilization with rehab.   [EW]  1910 Case was discussed with neurology at [facility], Dr. Katherine Roan.  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   [EW]  1910 Normal  Urine rapid drug screen (hosp performed) [EW]  1910 Normal  Urinalysis, Routine w reflex microscopic(!) [EW]  1910 Normal  Ethanol [EW]  1910 INR: 1.73 [EW]  1910 INR: 1.73 [EW]  1910 High  Protime-INR(!) [EW]  1911 Normal except hemoglobin low  CBC(!) [EW]  1911 Normal except creatinine high, glucose high, calcium low  I-Stat Chem 8, ED(!) [EW]  1912 CT images reviewed by me.  CT evidence for early right MCA region CVA.   [EW]    Clinical Course User Index [EW] Daleen Bo, MD     Patient Vitals for the past 24 hrs:  BP Temp src Pulse Resp SpO2 Height Weight  03/02/18 1900 (!) 178/85 - 98 (!) 25 97 % - -  03/02/18 1830 (!) 179/81 - 98 (!) 22 94 % - -  03/02/18 1800 - - (!) 112 20 91 % - -  03/02/18 1730 (!) 165/96 - 93 16 99 % - -  03/02/18 1724 - - - - - 5\' 5"  (1.651 m) 69.6 kg  03/02/18 1721 (!) 164/85 Oral (!) 107 - 97 % - -    7:13 PM Reevaluation with update and discussion. After initial assessment and treatment, an updated evaluation reveals no change in clinical status, findings discussed with patient's family members and all questions answered. Daleen Bo   Medical Decision Making: CVA, subacute, abnormal initial CT.  Patient requires hospitalization for monitoring, stabilization and neurology consultation.  Patient will be transferred to [facility] in [place] because no neurology is available at this facility this week.  CRITICAL CARE-yes Performed by: Daleen Bo   Nursing Notes Reviewed/ Care Coordinated Applicable Imaging Reviewed Interpretation of Laboratory Data incorporated into ED treatment   Case discussed with hospitalist to arrange admission and transfer to [facility]   Case discussed with neurologist at [facility], to arrange consultation.  Plan: Admit/transfer   Final Clinical Impressions(s) / ED Diagnoses  Final diagnoses:  Cerebrovascular accident (CVA), unspecified mechanism [facility])    ED Discharge Orders    None       Daleen Bo, MD 03/02/18 [phone]

## 2018-03-02 NOTE — H&P (Signed)
History and Physical    Lillieanna H Mooney-Riggs CNO:[id number] DOB: [birth year] DOA: 03/02/2018  PCP: [name], MD  Patient coming from: Home  I have personally briefly reviewed patient's old medical records in [facility]  Chief Complaint: Left-sided weakness  HPI: Elowen H Bankson is a 84 y.o. female with medical history significant for atrial fibrillation on Xarelto, CAD, history of CVA, hypertension, CKD stage III, and polymyalgia rheumatica who presents to the ED with left-sided weakness, facial droop, and slurred speech.  Time last well-known was around 9 AM.  Patient is able to provide most of recent history.  Her son and daughter-in-law are present during the time of my evaluation and provide additional history.  Patient states she has recently been dealing with a upper respiratory infection which has not been improving with oral azithromycin.  She called her PCP today and was prescribed Augmentin.  Her grandson visited her at home and saw her sitting down in the chair.  Around 4 PM she attempted to get up but was having difficulty lifting her left leg off the floor.  She was also having slurred speech and she was noted to have drooping at the left side of her face.  She says she had eye surgery on her right eye 4 days ago with right eye drooping since that time.  She has been on Xarelto for atrial fibrillation and says she has been taking it every night and has not seen any obvious bleeding.  She also reports recently completing a slow taper off of oral prednisone for treatment of her polymyalgia rheumatica.  She denies any chest pain, palpitations, dyspnea, abdominal pain, or dysuria.  ED Course:  Initial vitals showed BP 165/96, pulse 112, RR 20, SPO2 91% on room air.  Code stroke was called and initial CT head without contrast showed changes consistent with acute or early subacute infarct in the posterior right MCA territory.  No acute hemorrhage seen.  Telemetry  neurology were consulted and canceled code stroke due to CT evidence of stroke.  Further imaging with CTA head/neck and CT cerebral perfusion study were obtained which showed a short segment occlusion of the right MCA proximal M2 segment.  21 mL region of ischemia in the posterior right MCA territory was seen by CT perfusion criteria.  Bilateral carotid bifurcation atherosclerosis was seen with less than 50% stenosis.  Hospitalist service was consulted to admit for stroke work-up/management.  Due to no inpatient neurology coverage at any pen, the case was also discussed with neurology at [facility], Dr. Leonel Ramsay, who agreed to see in consultation on transfer to [facility].  Review of Systems: As per HPI otherwise 10 point review of systems negative.    Past Medical History:  Diagnosis Date  . Anemia   . CAD in native artery    a. NSTEMI 12/2005 s/p BMS to Cx, prior normal LVEF.  Marland Kitchen Cancer ([place]) 1988 and  2009   BCC nose and  Melanoma back  . Cancer of skin Jan. 2014   [facility]  . Carotid stenosis    mild  . Chronic back pain   . CKD (chronic kidney disease), stage III ([place])   . Coronary atherosclerosis of unspecified type of vessel, native or graft   . DJD (degenerative joint disease) of knee   . History of TIA (transient ischemic attack)   . HTN (hypertension)   . IBS (irritable bowel syndrome)   . Myocardial infarction ([place])  5 yrs ago     dr wall  cardiac  . Osteoporosis   . Other diseases of lung, not elsewhere classified    lung nodule  . Polymyalgia rheumatica ([place])   . Shingles 01/2016-02/2016  . Stroke [facility])     Past Surgical History:  Procedure Laterality Date  . ABDOMINAL HYSTERECTOMY  1977   partial  . BACK SURGERY  1982  . CARDIAC CATHETERIZATION    . COLONOSCOPY N/A 01/27/2013   Procedure: COLONOSCOPY;  Surgeon: Rogene Houston, MD;  Location: [facility]ITE;  Service: Endoscopy;  Laterality: N/A;  1030  . EYE SURGERY Bilateral 04/2017  .  EYE SURGERY Right 09/23/2017  . IR KYPHO LUMBAR INC FX REDUCE BONE BX UNI/BIL CANNULATION INC/IMAGING  09/30/2017  . IR VERTEBROPLASTY CERV/THOR BX INC UNI/BIL INC/INJECT/IMAGING  09/05/2017  . JOINT REPLACEMENT  2001 and  2011   6 knee surgeries   bil replacement  . KNEE ARTHROPLASTY    . KNEE ARTHROSCOPY    . LUMBAR LAMINECTOMY    . PR VEIN BYPASS GRAFT,AORTO-FEM-POP  2010  . removal of melanoma from back  2007  . SPINE SURGERY  2013   Broken back: fusion surgery  . tonisllectomy  1944  . total knee anthroplasty    . VERTEBROPLASTY  07/11/2011   Procedure: VERTEBROPLASTY;  Surgeon: Faythe Ghee, MD;  Location: [place] [facility]URO ORS;  Service: Neurosurgery;  Laterality: N/A;  Vertebroplasty of Lumbar Two     reports that she has never smoked. She has never used smokeless tobacco. She reports that she drinks alcohol. She reports that she does not use drugs.  Allergies  Allergen Reactions  . Epinephrine Other (See Comments)    During dental procedure-caused facial swelling at injection site. Pt has had epinephrine since then for nose and facial surgery and did fine with it per pt.  . Clopidogrel Bisulfate Itching and Rash    Plavix  . Oxycodone     Hallucinations. Pt states that she can tolerate hydrocodone    Family History  Problem Relation Age of Onset  . Heart disease Father   . Hypertension Father   . Heart attack Father   . Cancer Mother        bladder   . Hypertension Daughter   . Atrial fibrillation Daughter   . Hypertension Son   . Colon cancer Neg Hx      Prior to Admission medications   Medication Sig Start Date End Date Taking? Authorizing Provider  Abaloparatide (TYMLOS) 3120 MCG/1.56ML SOPN Inject 80 mcg into the skin daily. 12/05/17   Bo Merino, MD  acetaminophen (TYLENOL) 500 MG tablet Take 500 mg by mouth every 6 (six) hours as needed for mild pain.    [provider]  amLODipine (NORVASC) 5 MG tablet Take 5 mg by mouth daily.    [provider]  amoxicillin-clavulanate (AUGMENTIN) 875-125 MG tablet Take 1 tablet by mouth 2 (two) times daily for 10 days. 03/02/18 03/12/18  [name], MD  aspirin 81 MG tablet Take 81 mg by mouth daily.    [provider]  atorvastatin (LIPITOR) 80 MG tablet Take 1 tablet (80 mg total) by mouth at bedtime. 01/27/18   [name], MD  azithromycin (ZITHROMAX Z-PAK) 250 MG tablet Take 2 tablets (500 mg) on  Day 1,  followed by 1 tablet (250 mg) once daily on Days 2 through 5. 02/24/18   Kathyrn Drown, MD  Calcium Carbonate-Vit D-Min (CALCIUM  1200 PO) Take 1 tablet by mouth at bedtime.     [provider]  estrogens, conjugated, (PREMARIN) 0.3 MG tablet Take 1 tablet (0.3 mg total) by mouth every evening. 02/14/17   Johnson, Clanford L, MD  fluticasone (FLONASE) 50 MCG/ACT nasal spray USE TWO SPRAYS IN EACH NOSTRIL ONCE DAILY Patient taking differently: Place 1 spray into both nostrils daily as needed for allergies. USE TWO SPRAYS IN EACH NOSTRIL ONCE DAILY 12/03/16   [name], MD  Insulin Pen Needle (PEN NEEDLES) 30G X 8 MM MISC 1 each by Does not apply route daily. Use one pen needle daily to inject Tymlos. 12/17/17   Bo Merino, MD  lidocaine (LIDODERM) 5 % Place 1 patch onto the skin daily. Remove & Discard patch within 12 hours or as directed by MD 10/02/17   Eugenie Filler, MD  loperamide (IMODIUM) 2 MG capsule Take 2 mg by mouth daily as needed for diarrhea or loose stools.     [provider]  methocarbamol (ROBAXIN) 500 MG tablet Take 2 tablets (1,000 mg total) by mouth every 8 (eight) hours as needed for muscle spasms. 10/02/17   Eugenie Filler, MD  metoprolol succinate (TOPROL-XL) 50 MG 24 hr tablet Take 75 mg by mouth daily. Take with or immediately following a meal.    [provider]  nitroGLYCERIN (NITROSTAT) 0.4 MG SL tablet Place 1 tablet (0.4 mg total) under the tongue every 5 (five) minutes as needed for chest  pain. 12/03/16   [name], MD  pantoprazole (PROTONIX) 40 MG tablet TAKE 1 TABLET EVERY MORNING 10/20/17   Bo Merino, MD  predniSONE (DELTASONE) 1 MG tablet Take 3 tablets (3 mg total) by mouth daily with breakfast. Patient taking differently: Take 2 mg by mouth daily with breakfast.  01/09/18   Bo Merino, MD  PREMARIN 0.3 MG tablet TAKE 1 TABLET DAILY FOR 21 DAYS, THEN DO NOT TAKE FOR 7 DAYS 02/10/18   [name], MD  XARELTO 15 MG TABS tablet TAKE 1 TABLET DAILY WITH SUPPER 09/30/17   Arnoldo Lenis, MD  Zinc 50 MG CAPS Take 50 mg by mouth daily.    [provider]    Physical Exam: Vitals:   03/02/18 1800 03/02/18 1830 03/02/18 1900 03/02/18 1930  BP:  (!) 179/81 (!) 178/85 (!) 168/73  Pulse: (!) 112 98 98 80  Resp: 20 (!) 22 (!) 25 19  TempSrc:      SpO2: 91% 94% 97% 97%  Weight:      Height:        Constitutional: Elderly woman, resting in bed Eyes: PERRL, right-sided ptosis ENMT: Mucous membranes are dry. Posterior pharynx clear of any exudate or lesions. Neck: normal, supple, no masses. Respiratory: Inspiratory crackles left lung base, no wheezing. Normal respiratory effort. No accessory muscle use.  Cardiovascular: Regular rate and rhythm, no murmurs / rubs / gallops. No extremity edema. Abdomen: no tenderness, no masses palpated. No hepatosplenomegaly. Musculoskeletal: no clubbing / cyanosis. No joint deformity upper and lower extremities. ROM decreased left upper and lower extremities. Skin: no rashes, lesions, ulcers. No induration Neurologic: Left-sided facial droop present.  Tongue deviates to the left with protrusion.  Strength 2/5 LUE and LLE, 5/5 RUE and RLE. Sensation intact bilaterally. Psychiatric: Alert and oriented x 3. Normal mood.     Labs on Admission: I have personally reviewed following labs and imaging studies  CBC: Recent Labs  Lab 03/02/18 1729 03/02/18 1730  WBC  7.7  --   NEUTROABS 5.2  --   HGB 11.3*  12.9  HCT 38.0 38.0  MCV 89.6  --   PLT 381  --    Basic Metabolic Panel: Recent Labs  Lab 03/02/18 1729 03/02/18 1730  NA 138 140  K 4.1 4.4  CL 103 103  CO2 26  --   GLUCOSE 117* 114*  BUN 13 14  CREATININE 1.20* 1.20*  CALCIUM 8.9  --    GFR: Estimated Creatinine Clearance: 33.5 mL/min (A) (by C-G formula based on SCr of 1.2 mg/dL (H)). Liver Function Tests: Recent Labs  Lab 03/02/18 1729  AST 20  ALT 12  ALKPHOS 83  BILITOT 0.9  PROT 7.6  ALBUMIN 3.6   No results for input(s): LIPASE, AMYLASE in the last 168 hours. No results for input(s): AMMONIA in the last 168 hours. Coagulation Profile: Recent Labs  Lab 03/02/18 1729  INR 1.73   Cardiac Enzymes: No results for input(s): CKTOTAL, CKMB, CKMBINDEX, TROPONINI in the last 168 hours. BNP (last 3 results) No results for input(s): PROBNP in the last 8760 hours. HbA1C: No results for input(s): HGBA1C in the last 72 hours. CBG: Recent Labs  Lab 03/02/18 1725  GLUCAP 112*   Lipid Profile: No results for input(s): CHOL, HDL, LDLCALC, TRIG, CHOLHDL, LDLDIRECT in the last 72 hours. Thyroid Function Tests: No results for input(s): TSH, T4TOTAL, FREET4, T3FREE, THYROIDAB in the last 72 hours. Anemia Panel: No results for input(s): VITAMINB12, FOLATE, FERRITIN, TIBC, IRON, RETICCTPCT in the last 72 hours. Urine analysis:    Component Value Date/Time   COLORURINE YELLOW 03/02/2018 1729   APPEARANCEUR HAZY (A) 03/02/2018 1729   LABSPEC 1.023 03/02/2018 1729   PHURINE 8.0 03/02/2018 1729   GLUCOSEU NEGATIVE 03/02/2018 1729   HGBUR NEGATIVE 03/02/2018 1729   BILIRUBINUR NEGATIVE 03/02/2018 1729   KETONESUR NEGATIVE 03/02/2018 1729   PROTEINUR NEGATIVE 03/02/2018 1729   UROBILINOGEN 0.2 07/28/2008 1156   NITRITE NEGATIVE 03/02/2018 1729   LEUKOCYTESUR NEGATIVE 03/02/2018 1729    Radiological Exams on Admission: Ct Angio Head W Or Wo Contrast  Result Date: 03/02/2018 CLINICAL DATA:  Left-sided weakness  EXAM: CT ANGIOGRAPHY HEAD AND NECK CT PERFUSION BRAIN TECHNIQUE: Multidetector CT imaging of the head and neck was performed using the standard protocol during bolus administration of intravenous contrast. Multiplanar CT image reconstructions and MIPs were obtained to evaluate the vascular anatomy. Carotid stenosis measurements (when applicable) are obtained utilizing NASCET criteria, using the distal internal carotid diameter as the denominator. Multiphase CT imaging of the brain was performed following IV bolus contrast injection. Subsequent parametric perfusion maps were calculated using RAPID software. CONTRAST:  68mL ISOVUE-370 IOPAMIDOL (ISOVUE-370) INJECTION 76% COMPARISON:  None. FINDINGS: CTA NECK FINDINGS SKELETON: There is no bony spinal canal stenosis. No lytic or blastic lesion. OTHER NECK: Normal pharynx, larynx and major salivary glands. No cervical lymphadenopathy. Unremarkable thyroid gland. UPPER CHEST: No pneumothorax or pleural effusion. No nodules or masses. AORTIC ARCH: There is mild calcific atherosclerosis of the aortic arch. There is no aneurysm, dissection or hemodynamically significant stenosis of the visualized ascending aorta and aortic arch. Conventional 3 vessel aortic branching pattern. The visualized proximal subclavian arteries are widely patent. RIGHT CAROTID SYSTEM: --Common carotid artery: Widely patent origin without common carotid artery dissection or aneurysm. --Internal carotid artery: No dissection, occlusion or aneurysm. There is mixed density atherosclerosis extending into the proximal ICA, resulting in less than 50% stenosis. --External carotid artery: No acute abnormality. LEFT CAROTID SYSTEM: --Common  carotid artery: Widely patent origin without common carotid artery dissection or aneurysm. --Internal carotid artery: No dissection, occlusion or aneurysm. There is mixed density atherosclerosis extending into the proximal ICA, resulting in less than 50% stenosis.  --External carotid artery: No acute abnormality. VERTEBRAL ARTERIES: Left dominant configuration. There is moderate narrowing of both vertebral artery origins. No dissection, occlusion or flow-limiting stenosis to the vertebrobasilar confluence. CTA HEAD FINDINGS ANTERIOR CIRCULATION: --Intracranial internal carotid arteries: Atherosclerotic calcification of the internal carotid arteries at the skull base without hemodynamically significant stenosis. --Anterior cerebral arteries: Normal. Both A1 segments are present. Patent anterior communicating artery. --Middle cerebral arteries: There is a short segment occlusion of the proximal right M2 segment. The length of the occluded segment is approximately 5 mm. --Posterior communicating arteries: Present bilaterally. POSTERIOR CIRCULATION: --Basilar artery: Normal. --Posterior cerebral arteries: Normal. --Superior cerebellar arteries: Normal. --Inferior cerebellar arteries: Normal anterior and posterior inferior cerebellar arteries. VENOUS SINUSES: As permitted by contrast timing, patent. ANATOMIC VARIANTS: None DELAYED PHASE: No parenchymal contrast enhancement. Review of the MIP images confirms the above findings. ASPECTS ([place] Stroke Program Early CT Score) - Ganglionic level infarction (caudate, lentiform nuclei, internal capsule, insula, M1-M3 cortex): 5 - Supraganglionic infarction (M4-M6 cortex): 1 Total score (0-10 with 10 being normal): 6 CT Brain Perfusion Findings: CBF (<30%) Volume: 65mL Perfusion (Tmax>6.0s) volume: 43mL Mismatch Volume: 85mL Infarction Location:Posterior right MCA territory IMPRESSION: 1. Short segment occlusion of the right middle cerebral artery proximal M2 segment. 2. 21 mL region of ischemia in the posterior right MCA territory by CT perfusion criteria. The CT perfusion data does not demonstrate a core infarct, but the ASPECT Score is 6 with findings of early infarction in the posterior right MCA territory. 3. Bilateral carotid  bifurcation atherosclerosis with less than 50% stenosis. 4.  Aortic atherosclerosis (ICD10-I70.0). These results were called by telephone at the time of interpretation on 03/02/2018 at 6:07 pm to Dr. Daleen Bo , who verbally acknowledged these results. Electronically Signed   By: Ulyses Jarred M.D.   On: 03/02/2018 18:25   Ct Angio Neck W And/or Wo Contrast  Result Date: 03/02/2018 CLINICAL DATA:  Left-sided weakness EXAM: CT ANGIOGRAPHY HEAD AND NECK CT PERFUSION BRAIN TECHNIQUE: Multidetector CT imaging of the head and neck was performed using the standard protocol during bolus administration of intravenous contrast. Multiplanar CT image reconstructions and MIPs were obtained to evaluate the vascular anatomy. Carotid stenosis measurements (when applicable) are obtained utilizing NASCET criteria, using the distal internal carotid diameter as the denominator. Multiphase CT imaging of the brain was performed following IV bolus contrast injection. Subsequent parametric perfusion maps were calculated using RAPID software. CONTRAST:  50mL ISOVUE-370 IOPAMIDOL (ISOVUE-370) INJECTION 76% COMPARISON:  None. FINDINGS: CTA NECK FINDINGS SKELETON: There is no bony spinal canal stenosis. No lytic or blastic lesion. OTHER NECK: Normal pharynx, larynx and major salivary glands. No cervical lymphadenopathy. Unremarkable thyroid gland. UPPER CHEST: No pneumothorax or pleural effusion. No nodules or masses. AORTIC ARCH: There is mild calcific atherosclerosis of the aortic arch. There is no aneurysm, dissection or hemodynamically significant stenosis of the visualized ascending aorta and aortic arch. Conventional 3 vessel aortic branching pattern. The visualized proximal subclavian arteries are widely patent. RIGHT CAROTID SYSTEM: --Common carotid artery: Widely patent origin without common carotid artery dissection or aneurysm. --Internal carotid artery: No dissection, occlusion or aneurysm. There is mixed density  atherosclerosis extending into the proximal ICA, resulting in less than 50% stenosis. --External carotid artery: No acute abnormality. LEFT CAROTID SYSTEM: --Common carotid  artery: Widely patent origin without common carotid artery dissection or aneurysm. --Internal carotid artery: No dissection, occlusion or aneurysm. There is mixed density atherosclerosis extending into the proximal ICA, resulting in less than 50% stenosis. --External carotid artery: No acute abnormality. VERTEBRAL ARTERIES: Left dominant configuration. There is moderate narrowing of both vertebral artery origins. No dissection, occlusion or flow-limiting stenosis to the vertebrobasilar confluence. CTA HEAD FINDINGS ANTERIOR CIRCULATION: --Intracranial internal carotid arteries: Atherosclerotic calcification of the internal carotid arteries at the skull base without hemodynamically significant stenosis. --Anterior cerebral arteries: Normal. Both A1 segments are present. Patent anterior communicating artery. --Middle cerebral arteries: There is a short segment occlusion of the proximal right M2 segment. The length of the occluded segment is approximately 5 mm. --Posterior communicating arteries: Present bilaterally. POSTERIOR CIRCULATION: --Basilar artery: Normal. --Posterior cerebral arteries: Normal. --Superior cerebellar arteries: Normal. --Inferior cerebellar arteries: Normal anterior and posterior inferior cerebellar arteries. VENOUS SINUSES: As permitted by contrast timing, patent. ANATOMIC VARIANTS: None DELAYED PHASE: No parenchymal contrast enhancement. Review of the MIP images confirms the above findings. ASPECTS ([place] Stroke Program Early CT Score) - Ganglionic level infarction (caudate, lentiform nuclei, internal capsule, insula, M1-M3 cortex): 5 - Supraganglionic infarction (M4-M6 cortex): 1 Total score (0-10 with 10 being normal): 6 CT Brain Perfusion Findings: CBF (<30%) Volume: 11mL Perfusion (Tmax>6.0s) volume: 32mL Mismatch  Volume: 38mL Infarction Location:Posterior right MCA territory IMPRESSION: 1. Short segment occlusion of the right middle cerebral artery proximal M2 segment. 2. 21 mL region of ischemia in the posterior right MCA territory by CT perfusion criteria. The CT perfusion data does not demonstrate a core infarct, but the ASPECT Score is 6 with findings of early infarction in the posterior right MCA territory. 3. Bilateral carotid bifurcation atherosclerosis with less than 50% stenosis. 4.  Aortic atherosclerosis (ICD10-I70.0). These results were called by telephone at the time of interpretation on 03/02/2018 at 6:07 pm to Dr. Daleen Bo , who verbally acknowledged these results. Electronically Signed   By: Ulyses Jarred M.D.   On: 03/02/2018 18:25   Ct Cerebral Perfusion W Contrast  Result Date: 03/02/2018 CLINICAL DATA:  Left-sided weakness EXAM: CT ANGIOGRAPHY HEAD AND NECK CT PERFUSION BRAIN TECHNIQUE: Multidetector CT imaging of the head and neck was performed using the standard protocol during bolus administration of intravenous contrast. Multiplanar CT image reconstructions and MIPs were obtained to evaluate the vascular anatomy. Carotid stenosis measurements (when applicable) are obtained utilizing NASCET criteria, using the distal internal carotid diameter as the denominator. Multiphase CT imaging of the brain was performed following IV bolus contrast injection. Subsequent parametric perfusion maps were calculated using RAPID software. CONTRAST:  31mL ISOVUE-370 IOPAMIDOL (ISOVUE-370) INJECTION 76% COMPARISON:  None. FINDINGS: CTA NECK FINDINGS SKELETON: There is no bony spinal canal stenosis. No lytic or blastic lesion. OTHER NECK: Normal pharynx, larynx and major salivary glands. No cervical lymphadenopathy. Unremarkable thyroid gland. UPPER CHEST: No pneumothorax or pleural effusion. No nodules or masses. AORTIC ARCH: There is mild calcific atherosclerosis of the aortic arch. There is no aneurysm,  dissection or hemodynamically significant stenosis of the visualized ascending aorta and aortic arch. Conventional 3 vessel aortic branching pattern. The visualized proximal subclavian arteries are widely patent. RIGHT CAROTID SYSTEM: --Common carotid artery: Widely patent origin without common carotid artery dissection or aneurysm. --Internal carotid artery: No dissection, occlusion or aneurysm. There is mixed density atherosclerosis extending into the proximal ICA, resulting in less than 50% stenosis. --External carotid artery: No acute abnormality. LEFT CAROTID SYSTEM: --Common carotid artery: Widely patent origin  without common carotid artery dissection or aneurysm. --Internal carotid artery: No dissection, occlusion or aneurysm. There is mixed density atherosclerosis extending into the proximal ICA, resulting in less than 50% stenosis. --External carotid artery: No acute abnormality. VERTEBRAL ARTERIES: Left dominant configuration. There is moderate narrowing of both vertebral artery origins. No dissection, occlusion or flow-limiting stenosis to the vertebrobasilar confluence. CTA HEAD FINDINGS ANTERIOR CIRCULATION: --Intracranial internal carotid arteries: Atherosclerotic calcification of the internal carotid arteries at the skull base without hemodynamically significant stenosis. --Anterior cerebral arteries: Normal. Both A1 segments are present. Patent anterior communicating artery. --Middle cerebral arteries: There is a short segment occlusion of the proximal right M2 segment. The length of the occluded segment is approximately 5 mm. --Posterior communicating arteries: Present bilaterally. POSTERIOR CIRCULATION: --Basilar artery: Normal. --Posterior cerebral arteries: Normal. --Superior cerebellar arteries: Normal. --Inferior cerebellar arteries: Normal anterior and posterior inferior cerebellar arteries. VENOUS SINUSES: As permitted by contrast timing, patent. ANATOMIC VARIANTS: None DELAYED PHASE: No  parenchymal contrast enhancement. Review of the MIP images confirms the above findings. ASPECTS ([place] Stroke Program Early CT Score) - Ganglionic level infarction (caudate, lentiform nuclei, internal capsule, insula, M1-M3 cortex): 5 - Supraganglionic infarction (M4-M6 cortex): 1 Total score (0-10 with 10 being normal): 6 CT Brain Perfusion Findings: CBF (<30%) Volume: 83mL Perfusion (Tmax>6.0s) volume: 31mL Mismatch Volume: 69mL Infarction Location:Posterior right MCA territory IMPRESSION: 1. Short segment occlusion of the right middle cerebral artery proximal M2 segment. 2. 21 mL region of ischemia in the posterior right MCA territory by CT perfusion criteria. The CT perfusion data does not demonstrate a core infarct, but the ASPECT Score is 6 with findings of early infarction in the posterior right MCA territory. 3. Bilateral carotid bifurcation atherosclerosis with less than 50% stenosis. 4.  Aortic atherosclerosis (ICD10-I70.0). These results were called by telephone at the time of interpretation on 03/02/2018 at 6:07 pm to Dr. Daleen Bo , who verbally acknowledged these results. Electronically Signed   By: Ulyses Jarred M.D.   On: 03/02/2018 18:25   Ct Head Code Stroke Wo Contrast  Result Date: 03/02/2018 CLINICAL DATA:  Code stroke.  Left-sided weakness EXAM: CT HEAD WITHOUT CONTRAST TECHNIQUE: Contiguous axial images were obtained from the base of the skull through the vertex without intravenous contrast. COMPARISON:  None. FINDINGS: Brain: There is no mass, hemorrhage or extra-axial collection. The size and configuration of the ventricles and extra-axial CSF spaces are normal. There is hypoattenuation within the posterior right MCA territory suggesting acute or early subacute infarct. No midline shift or other mass effect. 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 Vascular: Mild hyperattenuation within the right MCA M2 segment. Bilateral distal  ICA atherosclerotic calcification. Skull: The visualized skull base, calvarium and extracranial soft tissues are normal. Sinuses/Orbits: No fluid levels or advanced mucosal thickening of the visualized paranasal sinuses. No mastoid or middle ear effusion. The orbits are normal. ASPECTS [facility] Stroke Program Early CT Score) - Ganglionic level infarction (caudate, lentiform nuclei, internal capsule, insula, M1-M3 cortex): 5 - Supraganglionic infarction (M4-M6 cortex): 1 Total score (0-10 with 10 being normal): 6 IMPRESSION: 1. No acute hemorrhage. 2. Hypoattenuation in the posterior right MCA territory consistent with acute or early subacute infarct. 3. ASPECTS is 6. These results were called by telephone at the time of interpretation on 03/02/2018 at 5:47 pm to Dr. Daleen Bo , who verbally acknowledged these results. Electronically Signed   By: Ulyses Jarred M.D.   On: 03/02/2018 17:53  EKG: Ordered and pending  Assessment/Plan Principal Problem:   Cerebrovascular accident (CVA) due to occlusion of right middle cerebral artery (HCC) Active Problems:   Essential hypertension   CAD, NATIVE VESSEL   Polymyalgia rheumatica (HCC)   Atrial fibrillation (HCC)   CKD (chronic kidney disease), stage III ([place])   Maybell H Greulich is a 84 y.o. female with medical history significant for atrial fibrillation on Xarelto, CAD, history of CVA, hypertension, CKD stage III, and polymyalgia rheumatica who presented to [facility] ED with left facial droop, dysarthria, and left-sided weakness found to have a stroke due to occlusion of the right MCA.  CVA due to occlusion of right MCA: Patient with persistent left facial droop, dysarthria, and left-sided weakness on admission.  Suspect cardio embolic or trauma embolic cause.  Telemetry neurology have recommended holding Xarelto and bridging with aspirin in the interim. -Aspirin 325 mg -Holding Xarelto per neurology recommendations -Continue home atorvastatin  80 mg daily -Transfer to [facility] for inpatient neurology/stroke team consultation -Echocardiogram -A1c, lipid panel -Permissive hypertension -Gentle IV fluids overnight -PT/OT/SLP eval  Atrial fibrillation: Currently rate controlled and in sinus rhythm by exam. -EKG -Holding Xarelto as above -Holding home Toprol-XL for permissive hypertension  Hypertension: Holding home amlodipine and Toprol-XL for permissive hypertension as above.  CAD: Continue aspirin and atorvastatin.  CKD stage III: Creatinine and GFR appear at baseline, continue to monitor.  Polymyalgia rheumatica: Follows with rheumatology, Dr. Estanislado Pandy.  Recently slowly tapered off of prednisone and started on abaloparatide, holding for now.  DVT prophylaxis: Heparin subq  Code Status: FULL CODE  Family Communication: Son, Shanon Brow, and daughter-in-law, Barbera Setters, at bedside. Disposition Plan: Transfer to [facility] for inpatient neurology consultation. Consults called: Teleneurology, Dr. Sheppard Coil, at [facility]. EDP discussed with Neurology, Dr. Leonel Ramsay, at [facility] who agreed to see in consultation upon transfer. Admission status: Inpatient   Zada Finders MD [facility] Pager [phone]  If 7PM-7AM, please contact night-coverage [email or web address] Password [email or web address]  03/02/2018, 8:08 PM

## 2018-03-02 NOTE — Consult Note (Addendum)
[facility] TeleNeurology Consult Services   Date of Service:   03/02/2018 17:48:31  Impression:     .  Right Hemispheric     .  MCA Distribution  Comments: Patient with left-sided weakness, sensory loss and dysarthria. CT shows evolving acute/subacute infarct in the right posterior MCA territory, likely cardioembolic in origin. While a right M2 branch occlusion is present on CTA, the infarct already being present on CT head wo would preclude safe thrombectomy.  Mechanism of Stroke: Possible Thromboembolic Possible Cardioembolic  Metrics: Last Known Well: 03/02/2018 09:00:00 [facility] Notification Time: 03/02/2018 17:47:33 Arrival Time: 03/02/2018 17:20:00 Stamp Time: 03/02/2018 17:48:31 Time First Login Attempt: 03/02/2018 17:56:00 Video Start Time: 03/02/2018 17:56:00  Symptoms: left-sided weakness NIHSS Start Assessment Time: 03/02/2018 18:03:00 Patient is not a candidate for tPA. Patient was not deemed candidate for tPA thrombolytics because of LKW > 4.5h, acute infarct already present on CT. Video End Time: 03/02/2018 18:12:00  CT head was reviewed - evolving acute/subacute infarct in posterior RMCA territory.  Advanced imaging was reviewed - right M2 occlusion.   Radiologist was not called back for review of advanced imaging because verbal report given by ED MD from rads ER Physician notified of the decision on thrombolytics management on 03/02/2018 18:12:00  Our recommendations are outlined below.  Recommendations:     .  Activate Stroke Protocol Admission/Order Set     .  Stroke/Telemetry Floor     .  Neuro Checks     .  Bedside Swallow Eval     .  DVT Prophylaxis     .  IV Fluids, Normal Saline     .  Head of Bed Below 30 Degrees     .  Euglycemia and Avoid Hyperthermia (PRN Acetaminophen)     .  Hold Xarelto, can bridge with ASA in interim  Routine Consultation with [facility] for Follow up Care  Sign Out:     .   Discussed with Emergency Department Provider    ------------------------------------------------------------------------------  History of Present Illness: Patient is a 84 year old Female.  Patient was brought by EMS for symptoms of left-sided weakness  Patient with a history of afib on Xarelto, HTN presenting with left-sided weakness. Family last saw patient normal at 0900. Thereafter they called her and her speech was slurred. On checking her they noted her left side was weak, so EMS summoned.  CT head was reviewed.    Examination: 1A: Level of Consciousness - Alert; keenly responsive + 0 1B: Ask Month and Age - Both Questions Right + 0 1C: Blink Eyes & Squeeze Hands - Performs Both Tasks + 0 2: Test Horizontal Extraocular Movements - Normal + 0 3: Test Visual Fields - No Visual Loss + 0 4: Test Facial Palsy (Use Grimace if Obtunded) - Partial paralysis (lower face) + 2 5A: Test Left Arm Motor Drift - Drift, hits bed + 2 5B: Test Right Arm Motor Drift - No Drift for 10 Seconds + 0 6A: Test Left Leg Motor Drift - Drift, hits bed + 2 6B: Test Right Leg Motor Drift - No Drift for 5 Seconds + 0 7: Test Limb Ataxia (FNF/Heel-Shin) - No Ataxia + 0 8: Test Sensation - Mild-Moderate Loss: Less Sharp/More Dull + 1 9: Test Language/Aphasia - Normal; No aphasia + 0 10: Test Dysarthria - Mild-Moderate Dysarthria: Slurring but can be understood + 1 11: Test Extinction/Inattention - No abnormality + 0  NIHSS Score: 8  Patient was informed the Neurology Consult  would happen via TeleHealth consult by way of interactive audio and video telecommunications and consented to receiving care in this manner.  Due to the immediate potential for life-threatening deterioration due to underlying acute neurologic illness, I spent 35 minutes providing critical care. This time includes time for face to face visit via telemedicine, review of medical records, imaging studies and discussion of findings with  providers, the patient and/or family.   Dr Arne Cleveland   [facility] [phone]

## 2018-03-02 NOTE — Telephone Encounter (Signed)
Aug 875 bid ten d 

## 2018-03-02 NOTE — Progress Notes (Signed)
CODE STROKE 1731 CALL TIME 1328 BEEPER TIME 1736 EXAM STARTED 1738 EXAM FINISHED [place]

## 2018-03-02 NOTE — Telephone Encounter (Signed)
Patient is aware 

## 2018-03-02 NOTE — ED Notes (Signed)
Contact information: Shanon Brow, son [phone] Barbera Setters, daughter-in-law [phone]

## 2018-03-02 NOTE — Telephone Encounter (Signed)
I spoke with the pt and she is not running a fever. She is still having a cough and congestion.Would like something called into the [place] to sleep she did take Nyquil last night and got some rest.

## 2018-03-02 NOTE — ED Triage Notes (Signed)
LKN 9 am today, home asleep at 4 pm woke up with left facial droop, CBG 107.  History of Afib on monitor. Weakness, asphasia, eye redness baseline.  Left sided weakness.

## 2018-03-02 NOTE — Telephone Encounter (Signed)
Pt seen 02/24/18 - cough & congestion, given Zpak  Pt still has a terrible cough, coughing up chunks of mucous  Pt states she's hearing "noises" from her chest  Can we call something else in or NTBS?  Please advise    [facility]/[address]

## 2018-03-03 ENCOUNTER — Other Ambulatory Visit: Payer: Self-pay

## 2018-03-03 ENCOUNTER — Inpatient Hospital Stay (HOSPITAL_COMMUNITY): Payer: Medicare Other

## 2018-03-03 DIAGNOSIS — I63511 Cerebral infarction due to unspecified occlusion or stenosis of right middle cerebral artery: Secondary | ICD-10-CM

## 2018-03-03 LAB — HEMOGLOBIN A1C
Hgb A1c MFr Bld: 5.5 % (ref 4.8–5.6)
Mean Plasma Glucose: 111.15 mg/dL

## 2018-03-03 LAB — LIPID PANEL
CHOL/HDL RATIO: 2.4 ratio
Cholesterol: 148 mg/dL (ref 0–200)
HDL: 61 mg/dL (ref >40)
LDL Cholesterol: 65 mg/dL (ref 0–99)
Triglycerides: 109 mg/dL (ref <150)
VLDL: 22 mg/dL (ref 0–40)

## 2018-03-03 MED ORDER — METOPROLOL TARTRATE 5 MG/5ML IV SOLN: 5.0000 mg | Freq: Once | INTRAVENOUS | Status: AC

## 2018-03-03 MED ORDER — METOPROLOL TARTRATE 5 MG/5ML IV SOLN
INTRAVENOUS | Status: AC
  Administered 2018-03-03: 5 mg
  Filled 2018-03-03: qty 5

## 2018-03-03 MED ORDER — SODIUM CHLORIDE 0.9 % IV SOLN
INTRAVENOUS | Status: DC
  Administered 2018-03-03 – 2018-03-07 (×5): via INTRAVENOUS

## 2018-03-03 NOTE — Evaluation (Signed)
Clinical/Bedside Swallow Evaluation Patient Details  Name: Brenda Hall MRN: [id number] Date of Birth: [birth year]  Today's Date: 03/03/2018 Time: SLP Start Time (ACUTE ONLY): 0805 SLP Stop Time (ACUTE ONLY): 0830 SLP Time Calculation (min) (ACUTE ONLY): 25 min  Past Medical History:  Past Medical History:  Diagnosis Date  . Anemia   . CAD in native artery    a. NSTEMI 12/2005 s/p BMS to Cx, prior normal LVEF.  Marland Kitchen Cancer ([place]) 1988 and  2009   BCC nose and  Melanoma back  . Cancer of skin Jan. 2014   [facility] Forehead and Nose  . Carotid stenosis    mild  . Chronic back pain   . CKD (chronic kidney disease), stage III ([place])   . Coronary atherosclerosis of unspecified type of vessel, native or graft   . DJD (degenerative joint disease) of knee   . History of TIA (transient ischemic attack)   . HTN (hypertension)   . IBS (irritable bowel syndrome)   . Myocardial infarction [facility])    5 yrs ago     dr wall  cardiac  . Osteoporosis   . Other diseases of lung, not elsewhere classified    lung nodule  . Polymyalgia rheumatica ([place])   . Shingles 01/2016-02/2016  . Stroke [facility])    Past Surgical History:  Past Surgical History:  Procedure Laterality Date  . ABDOMINAL HYSTERECTOMY  1977   partial  . BACK SURGERY  1982  . CARDIAC CATHETERIZATION    . COLONOSCOPY N/A 01/27/2013   Procedure: COLONOSCOPY;  Surgeon: Rogene Houston, MD;  Location: [facility] ENDO SUITE;  Service: Endoscopy;  Laterality: N/A;  1030  . EYE SURGERY Bilateral 04/2017  . EYE SURGERY Right 09/23/2017  . IR KYPHO LUMBAR INC FX REDUCE BONE BX UNI/BIL CANNULATION INC/IMAGING  09/30/2017  . IR VERTEBROPLASTY CERV/THOR BX INC UNI/BIL INC/INJECT/IMAGING  09/05/2017  . JOINT REPLACEMENT  2001 and  2011   6 knee surgeries   bil replacement  . KNEE ARTHROPLASTY    . KNEE ARTHROSCOPY    . LUMBAR LAMINECTOMY    . PR VEIN BYPASS GRAFT,AORTO-FEM-POP  2010  . removal of melanoma from back  2007  . SPINE SURGERY  2013   Broken back: fusion surgery  . tonisllectomy  1944  . total knee anthroplasty    . VERTEBROPLASTY  07/11/2011   Procedure: VERTEBROPLASTY;  Surgeon: Faythe Ghee, MD;  Location: [place] NEURO ORS;  Service: Neurosurgery;  Laterality: N/A;  Vertebroplasty of Lumbar Two   HPI:  84 yo female adm to [facility] with left sided weakness (dense hemiplegia), dysarthria and AMS.  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 PMH + for shingles, PMR, TIA, CAD, recent viral infection- s/p Zpac use per pt.  Swallow and speech evaluation ordered.  Pt did not pass the [place] swallow screen.     Assessment / Plan / Recommendation Clinical Impression  Pt with obvious facial, trigeminal, hypoglossal and likely vagal/glossopharyngeal nerve involvement. Poor awareness to left buccal region residuals requireing slp to remove with toothette.  Pt is NOT eliciting a swallow stating "it hurts" but also suspect neuromotor involvement.  Trials of ice, thin water, cranberry juice provided - Hand over hand assist to elicit neural function provided but pt holds all boluses and allows anterior spillage on left.  Single swallow observed only of 7 boluses.  Suspect mentation and neuromuscular deficits - also ? impact of odynophagia.   Pt repeatedly asked for a popsicle, water and ice -  but did NOT elicit a swallow with all boluses but 1 offered.     Recommend NPO with adequate oral care and SLP follow up for readiness for po.   SLP Visit Diagnosis: Dysphagia, oral phase (R13.11)    Aspiration Risk  Severe aspiration risk;Risk for inadequate nutrition/hydration    Diet Recommendation NPO   Medication Administration: Via alternative means    Other  Recommendations Oral Care Recommendations: Oral care QID   Follow up Recommendations (tbd)      Frequency and Duration min 1 x/week  1 week       Prognosis Prognosis for Safe Diet Advancement: Guarded Barriers to Reach Goals: Severity of deficits;Behavior      Swallow Study   General Date of  Onset: 03/03/18 HPI: 84 yo female adm to [facility] with left sided weakness (dense hemiplegia), dysarthria and AMS.  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 PMH + for shingles, PMR, TIA, CAD, recent viral infection- s/p Zpac use per pt.  Swallow and speech evaluation ordered.  Pt did not pass the [place] swallow screen.   Type of Study: Bedside Swallow Evaluation Previous Swallow Assessment: none in epic Diet Prior to this Study: NPO Temperature Spikes Noted: Yes(101.2) Respiratory Status: Nasal cannula History of Recent Intubation: No Behavior/Cognition: Lethargic/Drowsy;Distractible;Requires cueing;Other (Comment)(inconsistently follows directions) Oral Care Completed by SLP: Yes Oral Cavity - Dentition: Adequate natural dentition Vision: (total cues to maintain eyes opened) Self-Feeding Abilities: Total assist Patient Positioning: Upright in bed Baseline Vocal Quality: Low vocal intensity Volitional Cough: Weak Volitional Swallow: Unable to elicit    Oral/Motor/Sensory Function Overall Oral Motor/Sensory Function: Severe impairment Facial ROM: Reduced left Facial Symmetry: Abnormal symmetry left Facial Strength: Reduced left Facial Sensation: Reduced left Lingual ROM: Reduced left Lingual Strength: Reduced Lingual Sensation: Reduced Velum: Suspected CN X (Vagus) dysfunction(sluggish) Mandible: Impaired(pt with decreased sensation to left buccal region)   Ice Chips Ice chips: Impaired Presentation: Spoon Oral Phase Impairments: Reduced labial seal;Poor awareness of bolus;Reduced lingual movement/coordination Oral Phase Functional Implications: Oral holding;Left anterior spillage;Left lateral sulci pocketing   Thin Liquid Thin Liquid: Impaired Presentation: Spoon Oral Phase Impairments: Reduced labial seal;Reduced lingual movement/coordination;Poor awareness of bolus Oral Phase Functional Implications: Left anterior spillage;Left lateral sulci pocketing;Oral holding Other Comments: swallow  elicited x1 only with max cues, appearance of decreased laryngeal elevation    Nectar Thick Nectar Thick Liquid: Impaired Presentation: Spoon;Cup Oral Phase Impairments: Reduced labial seal;Reduced lingual movement/coordination;Poor awareness of bolus Oral phase functional implications: Left anterior spillage;Left lateral sulci pocketing;Oral holding   Honey Thick Honey Thick Liquid: Not tested   Puree Puree: Not tested   Solid     Solid: Not tested      Macario Golds 03/03/2018,9:45 AM  Luanna Salk, MS [facility] SLP [place] Pager [phone] Office [phone]

## 2018-03-03 NOTE — Progress Notes (Signed)
Pt rhythm changed to afib with rvr after working some with PT.  HR is 140's-168. Dr Tana Coast notified and orders received for ekg and lopressor 5mg  iv.

## 2018-03-03 NOTE — Progress Notes (Addendum)
Physical Therapy Evaluation Patient Details Name: Brenda Hall MRN: [id number] DOB: 05/30/1930 Today's Date: 03/03/2018   History of Present Illness  Pt is a 84 y/o female with PMH significant for atrial fibrillation on Xarelto, CAD, history of CVA, hypertension, CKD stage III, and polymyalgia rheumatica admitted with L sided weakness, facial droop and slurred speech.  Hx of R eye surgery 4 days ago and R eye drooping since. CT reveals acute/early subacute infarct in the posterior R MCA territory. Admitted for further stroke workup.    Clinical Impression  Pt admitted with/for signs of R CVA.  Presently this patient shows signs of inattention, distractibility and low focus.  Pt needing max assist for all mobility and will initially need assist of 2 persons for safety.  Pt currently limited functionally due to the problems listed. ( See problems list.)   Pt will benefit from PT to maximize function and safety in order to get ready for next venue listed below. I have discussed the patient's current level of function related to general mobility with the patient and family--son, dtr and grandson.  The family has witnessed the evaluation session.  They acknowledge understanding of this and do not feel the patient would be able to have their care needs met at home.  They are interested in post-acute rehab.       Follow Up Recommendations SNF;Supervision/Assistance - 24 hour    Equipment Recommendations  None recommended by PT    Recommendations for Other Services       Precautions / Restrictions Precautions Precautions: Fall Restrictions Weight Bearing Restrictions: No      Mobility  Bed Mobility Overal bed mobility: Needs Assistance Bed Mobility: Rolling Rolling: Max assist;Total assist;+2 for physical assistance         General bed mobility comments: Rolling and transition to EOB with max assist.  Pt was particular about when assist was give due to hypersensitivity and  general pain, mostly in her back  Transfers Overall transfer level: Needs assistance   Transfers: Sit to/from Stand;Stand Pivot Transfers Sit to Stand: Max assist Stand pivot transfers: Max assist       General transfer comment: cues for hand placement and to redirect loss of focus.  Sit to stand x5 during peri care and face to face transfer assist to/from [facility]  Ambulation/Gait                Stairs            Wheelchair Mobility    Modified Rankin (Stroke Patients Only) Modified Rankin (Stroke Patients Only) Pre-Morbid Rankin Score: Slight disability Modified Rankin: Severe disability     Balance Overall balance assessment: Needs assistance   Sitting balance-Leahy Scale: Poor Sitting balance - Comments: list to the Left, suspected skewed sense of vertical   Standing balance support: During functional activity Standing balance-Leahy Scale: Poor Standing balance comment: face to face assist to stand with cues for hand placement that ended up generally in a "bear hug" for safety.                             Pertinent Vitals/Pain Pain Assessment: Faces Faces Pain Scale: Hurts even more Pain Location: throat soreness, back/neck with mobility general legs Pain Descriptors / Indicators: Discomfort;Sore Pain Intervention(s): Monitored during session    Home Living Family/patient expects to be discharged to:: Private residence Living Arrangements: Other relatives(grandson at night ) Available Help at Discharge: Family;Available PRN/intermittently Type  of Home: House Home Access: Stairs to enter Entrance Stairs-Rails: Right;Left;Can reach both Entrance Stairs-Number of Steps: 3 Home Layout: One level Home Equipment: Walker - 4 wheels;Grab bars - tub/shower;Shower seat - built in;Bedside commode;Hand held shower head Additional Comments: (family members work days)    Prior Function Level of Independence: Independent with assistive device(s)(per  grandson, could walk around room without AD)         Comments: household ambulator with Rollator, + driving, light IADLs, indepedent ADLs       Hand Dominance   Dominant Hand: Right    Extremity/Trunk Assessment   Upper Extremity Assessment Upper Extremity Assessment: Defer to OT evaluation LUE Deficits / Details: Grossly 3-/5 proximally and 0/5 distally (wrist/hand), absent sensation LUE Sensation: decreased light touch;decreased proprioception LUE Coordination: decreased fine motor;decreased gross motor    Lower Extremity Assessment Lower Extremity Assessment: RLE deficits/detail;LLE deficits/detail RLE Deficits / Details: moves against gravity, grossly 3+/5, painful knee, hypersensitive skin RLE: Unable to fully assess due to pain RLE Coordination: decreased fine motor LLE Deficits / Details: moves mostly in synergy with a little isolation.  hip flexion 3-, gross extension 3-, df 2/ pf 3+ LLE: Unable to fully assess due to pain LLE Coordination: decreased fine motor;decreased gross motor       Communication   Communication: No difficulties  Cognition Arousal/Alertness: Lethargic;Awake/alert Behavior During Therapy: Flat affect Overall Cognitive Status: Impaired/Different from baseline Area of Impairment: Attention;Memory;Following commands;Safety/judgement;Awareness;Problem solving                   Current Attention Level: Sustained Memory: Decreased recall of precautions;Decreased short-term memory Following Commands: Follows one step commands inconsistently;Follows one step commands with increased time Safety/Judgement: Decreased awareness of safety;Decreased awareness of deficits Awareness: Emergent Problem Solving: Slow processing;Decreased initiation;Difficulty sequencing;Requires verbal cues;Requires tactile cues General Comments: some left sided inattension, distractablilty,       General Comments      Exercises     Assessment/Plan    PT  Assessment Patient needs continued PT services  PT Problem List Decreased strength;Decreased activity tolerance;Decreased balance;Decreased mobility;Decreased coordination;Decreased safety awareness;Pain       PT Treatment Interventions Functional mobility training;Therapeutic activities;Balance training;Patient/family education;DME instruction;Gait training    PT Goals (Current goals can be found in the Care Plan section)  Acute Rehab PT Goals Patient Stated Goal: to be able to have a cold drink PT Goal Formulation: With patient Time For Goal Achievement: 03/17/18 Potential to Achieve Goals: Good    Frequency Min 3X/week   Barriers to discharge        Co-evaluation               AM-PAC PT "6 Clicks" Daily Activity  Outcome Measure Difficulty turning over in bed (including adjusting bedclothes, sheets and blankets)?: Unable Difficulty moving from lying on back to sitting on the side of the bed? : Unable Difficulty sitting down on and standing up from a chair with arms (e.g., wheelchair, bedside commode, etc,.)?: Unable Help needed moving to and from a bed to chair (including a wheelchair)?: A Lot Help needed walking in hospital room?: Total Help needed climbing 3-5 steps with a railing? : Total 6 Click Score: 7    End of Session   Activity Tolerance: Patient tolerated treatment well Patient left: in bed;with call bell/phone within reach;with bed alarm set;with family/visitor present Nurse Communication: Mobility status PT Visit Diagnosis: Other abnormalities of gait and mobility (R26.89);Muscle weakness (generalized) (M62.81);Hemiplegia and hemiparesis;Pain Hemiplegia - Right/Left: Left Hemiplegia -  dominant/non-dominant: Non-dominant Hemiplegia - caused by: Cerebral infarction Pain - part of body: (back and legs, general)    Time: 0947-0962 PT Time Calculation (min) (ACUTE ONLY): 64 min   Charges:   PT Evaluation $PT Eval Moderate Complexity: 1 Mod PT  Treatments $Therapeutic Activity: 23-37 mins $Neuromuscular Re-education: 8-22 mins        03/03/2018  [name], PT Acute Rehabilitation Services [phone]  (pager) [phone]  (office)  Tessie Fass Tayana Shankle 03/03/2018, 3:20 PM

## 2018-03-03 NOTE — Progress Notes (Signed)
Pt's rhythm is now sinus in the 70's.  BP 131/96. Dr. Tana Coast notified.  No new orders given.

## 2018-03-03 NOTE — Progress Notes (Addendum)
STROKE TEAM PROGRESS NOTE   INTERVAL HISTORY Her son and grandson is at the bedside.  She is an active woman. She has been sick with the flu this week, treated with Zpack from Dr. Wolfgang Phoenix. Recent R eye surgery (Thurs) and right eye is sore, 2 other back surgeries this year as well. Recently went to a wedding.  She is more active with exam not as poor as the imaging. Will watch for a few days and adjust plan of care based on progress, she is at risk for increased cerebral edema.  Vitals:   03/03/18 0206 03/03/18 0344 03/03/18 0544 03/03/18 0726  BP: (!) 164/140 (!) 158/60 (!) 178/67 (!) 179/82  Pulse: 94 92 79 94  Resp: 17 20 (!) 22 (!) 35  Temp: 99.1 F (37.3 C) 99 F (37.2 C) 99 F (37.2 C) 99 F (37.2 C)  TempSrc: Oral Oral Oral Oral  SpO2: 96% 96% 98% 98%  Weight:      Height:        CBC:  Recent Labs  Lab 03/02/18 1729 03/02/18 1730  WBC 7.7  --   NEUTROABS 5.2  --   HGB 11.3* 12.9  HCT 38.0 38.0  MCV 89.6  --   PLT 381  --     Basic Metabolic Panel:  Recent Labs  Lab 03/02/18 1729 03/02/18 1730  NA 138 140  K 4.1 4.4  CL 103 103  CO2 26  --   GLUCOSE 117* 114*  BUN 13 14  CREATININE 1.20* 1.20*  CALCIUM 8.9  --    Lipid Panel:     Component Value Date/Time   CHOL 163 05/31/2017 0858   TRIG 103 05/31/2017 0858   HDL 75 05/31/2017 0858   CHOLHDL 2.2 05/31/2017 0858   CHOLHDL 2.3 12/14/2013 0910   VLDL 20 12/14/2013 0910   LDLCALC 67 05/31/2017 0858   HgbA1c:  Lab Results  Component Value Date   HGBA1C 5.5 03/03/2018   Urine Drug Screen:     Component Value Date/Time   LABOPIA NONE DETECTED 03/02/2018 1816   COCAINSCRNUR NONE DETECTED 03/02/2018 1816   LABBENZ NONE DETECTED 03/02/2018 1816   AMPHETMU NONE DETECTED 03/02/2018 1816   THCU NONE DETECTED 03/02/2018 1816   LABBARB NONE DETECTED 03/02/2018 1816    Alcohol Level     Component Value Date/Time   ETH <10 03/02/2018 1729    IMAGING Ct Angio Head W Or Wo Contrast  Result Date:  03/02/2018 CLINICAL DATA:  Left-sided weakness EXAM: CT ANGIOGRAPHY HEAD AND NECK CT PERFUSION BRAIN TECHNIQUE: Multidetector CT imaging of the head and neck was performed using the standard protocol during bolus administration of intravenous contrast. Multiplanar CT image reconstructions and MIPs were obtained to evaluate the vascular anatomy. Carotid stenosis measurements (when applicable) are obtained utilizing NASCET criteria, using the distal internal carotid diameter as the denominator. Multiphase CT imaging of the brain was performed following IV bolus contrast injection. Subsequent parametric perfusion maps were calculated using RAPID software. CONTRAST:  41mL ISOVUE-370 IOPAMIDOL (ISOVUE-370) INJECTION 76% COMPARISON:  None. FINDINGS: CTA NECK FINDINGS SKELETON: There is no bony spinal canal stenosis. No lytic or blastic lesion. OTHER NECK: Normal pharynx, larynx and major salivary glands. No cervical lymphadenopathy. Unremarkable thyroid gland. UPPER CHEST: No pneumothorax or pleural effusion. No nodules or masses. AORTIC ARCH: There is mild calcific atherosclerosis of the aortic arch. There is no aneurysm, dissection or hemodynamically significant stenosis of the visualized ascending aorta and aortic arch. Conventional 3 vessel aortic  branching pattern. The visualized proximal subclavian arteries are widely patent. RIGHT CAROTID SYSTEM: --Common carotid artery: Widely patent origin without common carotid artery dissection or aneurysm. --Internal carotid artery: No dissection, occlusion or aneurysm. There is mixed density atherosclerosis extending into the proximal ICA, resulting in less than 50% stenosis. --External carotid artery: No acute abnormality. LEFT CAROTID SYSTEM: --Common carotid artery: Widely patent origin without common carotid artery dissection or aneurysm. --Internal carotid artery: No dissection, occlusion or aneurysm. There is mixed density atherosclerosis extending into the proximal  ICA, resulting in less than 50% stenosis. --External carotid artery: No acute abnormality. VERTEBRAL ARTERIES: Left dominant configuration. There is moderate narrowing of both vertebral artery origins. No dissection, occlusion or flow-limiting stenosis to the vertebrobasilar confluence. CTA HEAD FINDINGS ANTERIOR CIRCULATION: --Intracranial internal carotid arteries: Atherosclerotic calcification of the internal carotid arteries at the skull base without hemodynamically significant stenosis. --Anterior cerebral arteries: Normal. Both A1 segments are present. Patent anterior communicating artery. --Middle cerebral arteries: There is a short segment occlusion of the proximal right M2 segment. The length of the occluded segment is approximately 5 mm. --Posterior communicating arteries: Present bilaterally. POSTERIOR CIRCULATION: --Basilar artery: Normal. --Posterior cerebral arteries: Normal. --Superior cerebellar arteries: Normal. --Inferior cerebellar arteries: Normal anterior and posterior inferior cerebellar arteries. VENOUS SINUSES: As permitted by contrast timing, patent. ANATOMIC VARIANTS: None DELAYED PHASE: No parenchymal contrast enhancement. Review of the MIP images confirms the above findings. ASPECTS ([place] Stroke Program Early CT Score) - Ganglionic level infarction (caudate, lentiform nuclei, internal capsule, insula, M1-M3 cortex): 5 - Supraganglionic infarction (M4-M6 cortex): 1 Total score (0-10 with 10 being normal): 6 CT Brain Perfusion Findings: CBF (<30%) Volume: 34mL Perfusion (Tmax>6.0s) volume: 46mL Mismatch Volume: 38mL Infarction Location:Posterior right MCA territory IMPRESSION: 1. Short segment occlusion of the right middle cerebral artery proximal M2 segment. 2. 21 mL region of ischemia in the posterior right MCA territory by CT perfusion criteria. The CT perfusion data does not demonstrate a core infarct, but the ASPECT Score is 6 with findings of early infarction in the posterior right  MCA territory. 3. Bilateral carotid bifurcation atherosclerosis with less than 50% stenosis. 4.  Aortic atherosclerosis (ICD10-I70.0). These results were called by telephone at the time of interpretation on 03/02/2018 at 6:07 pm to Dr. Daleen Bo , who verbally acknowledged these results. Electronically Signed   By: Ulyses Jarred M.D.   On: 03/02/2018 18:25   Ct Angio Neck W And/or Wo Contrast  Result Date: 03/02/2018 CLINICAL DATA:  Left-sided weakness EXAM: CT ANGIOGRAPHY HEAD AND NECK CT PERFUSION BRAIN TECHNIQUE: Multidetector CT imaging of the head and neck was performed using the standard protocol during bolus administration of intravenous contrast. Multiplanar CT image reconstructions and MIPs were obtained to evaluate the vascular anatomy. Carotid stenosis measurements (when applicable) are obtained utilizing NASCET criteria, using the distal internal carotid diameter as the denominator. Multiphase CT imaging of the brain was performed following IV bolus contrast injection. Subsequent parametric perfusion maps were calculated using RAPID software. CONTRAST:  77mL ISOVUE-370 IOPAMIDOL (ISOVUE-370) INJECTION 76% COMPARISON:  None. FINDINGS: CTA NECK FINDINGS SKELETON: There is no bony spinal canal stenosis. No lytic or blastic lesion. OTHER NECK: Normal pharynx, larynx and major salivary glands. No cervical lymphadenopathy. Unremarkable thyroid gland. UPPER CHEST: No pneumothorax or pleural effusion. No nodules or masses. AORTIC ARCH: There is mild calcific atherosclerosis of the aortic arch. There is no aneurysm, dissection or hemodynamically significant stenosis of the visualized ascending aorta and aortic arch. Conventional 3 vessel aortic branching pattern.  The visualized proximal subclavian arteries are widely patent. RIGHT CAROTID SYSTEM: --Common carotid artery: Widely patent origin without common carotid artery dissection or aneurysm. --Internal carotid artery: No dissection, occlusion or  aneurysm. There is mixed density atherosclerosis extending into the proximal ICA, resulting in less than 50% stenosis. --External carotid artery: No acute abnormality. LEFT CAROTID SYSTEM: --Common carotid artery: Widely patent origin without common carotid artery dissection or aneurysm. --Internal carotid artery: No dissection, occlusion or aneurysm. There is mixed density atherosclerosis extending into the proximal ICA, resulting in less than 50% stenosis. --External carotid artery: No acute abnormality. VERTEBRAL ARTERIES: Left dominant configuration. There is moderate narrowing of both vertebral artery origins. No dissection, occlusion or flow-limiting stenosis to the vertebrobasilar confluence. CTA HEAD FINDINGS ANTERIOR CIRCULATION: --Intracranial internal carotid arteries: Atherosclerotic calcification of the internal carotid arteries at the skull base without hemodynamically significant stenosis. --Anterior cerebral arteries: Normal. Both A1 segments are present. Patent anterior communicating artery. --Middle cerebral arteries: There is a short segment occlusion of the proximal right M2 segment. The length of the occluded segment is approximately 5 mm. --Posterior communicating arteries: Present bilaterally. POSTERIOR CIRCULATION: --Basilar artery: Normal. --Posterior cerebral arteries: Normal. --Superior cerebellar arteries: Normal. --Inferior cerebellar arteries: Normal anterior and posterior inferior cerebellar arteries. VENOUS SINUSES: As permitted by contrast timing, patent. ANATOMIC VARIANTS: None DELAYED PHASE: No parenchymal contrast enhancement. Review of the MIP images confirms the above findings. ASPECTS ([place] Stroke Program Early CT Score) - Ganglionic level infarction (caudate, lentiform nuclei, internal capsule, insula, M1-M3 cortex): 5 - Supraganglionic infarction (M4-M6 cortex): 1 Total score (0-10 with 10 being normal): 6 CT Brain Perfusion Findings: CBF (<30%) Volume: 39mL Perfusion  (Tmax>6.0s) volume: 21mL Mismatch Volume: 57mL Infarction Location:Posterior right MCA territory IMPRESSION: 1. Short segment occlusion of the right middle cerebral artery proximal M2 segment. 2. 21 mL region of ischemia in the posterior right MCA territory by CT perfusion criteria. The CT perfusion data does not demonstrate a core infarct, but the ASPECT Score is 6 with findings of early infarction in the posterior right MCA territory. 3. Bilateral carotid bifurcation atherosclerosis with less than 50% stenosis. 4.  Aortic atherosclerosis (ICD10-I70.0). These results were called by telephone at the time of interpretation on 03/02/2018 at 6:07 pm to Dr. Daleen Bo , who verbally acknowledged these results. Electronically Signed   By: Ulyses Jarred M.D.   On: 03/02/2018 18:25   Ct Cerebral Perfusion W Contrast  Result Date: 03/02/2018 CLINICAL DATA:  Left-sided weakness EXAM: CT ANGIOGRAPHY HEAD AND NECK CT PERFUSION BRAIN TECHNIQUE: Multidetector CT imaging of the head and neck was performed using the standard protocol during bolus administration of intravenous contrast. Multiplanar CT image reconstructions and MIPs were obtained to evaluate the vascular anatomy. Carotid stenosis measurements (when applicable) are obtained utilizing NASCET criteria, using the distal internal carotid diameter as the denominator. Multiphase CT imaging of the brain was performed following IV bolus contrast injection. Subsequent parametric perfusion maps were calculated using RAPID software. CONTRAST:  61mL ISOVUE-370 IOPAMIDOL (ISOVUE-370) INJECTION 76% COMPARISON:  None. FINDINGS: CTA NECK FINDINGS SKELETON: There is no bony spinal canal stenosis. No lytic or blastic lesion. OTHER NECK: Normal pharynx, larynx and major salivary glands. No cervical lymphadenopathy. Unremarkable thyroid gland. UPPER CHEST: No pneumothorax or pleural effusion. No nodules or masses. AORTIC ARCH: There is mild calcific atherosclerosis of the aortic  arch. There is no aneurysm, dissection or hemodynamically significant stenosis of the visualized ascending aorta and aortic arch. Conventional 3 vessel aortic branching pattern. The visualized proximal  subclavian arteries are widely patent. RIGHT CAROTID SYSTEM: --Common carotid artery: Widely patent origin without common carotid artery dissection or aneurysm. --Internal carotid artery: No dissection, occlusion or aneurysm. There is mixed density atherosclerosis extending into the proximal ICA, resulting in less than 50% stenosis. --External carotid artery: No acute abnormality. LEFT CAROTID SYSTEM: --Common carotid artery: Widely patent origin without common carotid artery dissection or aneurysm. --Internal carotid artery: No dissection, occlusion or aneurysm. There is mixed density atherosclerosis extending into the proximal ICA, resulting in less than 50% stenosis. --External carotid artery: No acute abnormality. VERTEBRAL ARTERIES: Left dominant configuration. There is moderate narrowing of both vertebral artery origins. No dissection, occlusion or flow-limiting stenosis to the vertebrobasilar confluence. CTA HEAD FINDINGS ANTERIOR CIRCULATION: --Intracranial internal carotid arteries: Atherosclerotic calcification of the internal carotid arteries at the skull base without hemodynamically significant stenosis. --Anterior cerebral arteries: Normal. Both A1 segments are present. Patent anterior communicating artery. --Middle cerebral arteries: There is a short segment occlusion of the proximal right M2 segment. The length of the occluded segment is approximately 5 mm. --Posterior communicating arteries: Present bilaterally. POSTERIOR CIRCULATION: --Basilar artery: Normal. --Posterior cerebral arteries: Normal. --Superior cerebellar arteries: Normal. --Inferior cerebellar arteries: Normal anterior and posterior inferior cerebellar arteries. VENOUS SINUSES: As permitted by contrast timing, patent. ANATOMIC VARIANTS:  None DELAYED PHASE: No parenchymal contrast enhancement. Review of the MIP images confirms the above findings. ASPECTS ([place] Stroke Program Early CT Score) - Ganglionic level infarction (caudate, lentiform nuclei, internal capsule, insula, M1-M3 cortex): 5 - Supraganglionic infarction (M4-M6 cortex): 1 Total score (0-10 with 10 being normal): 6 CT Brain Perfusion Findings: CBF (<30%) Volume: 70mL Perfusion (Tmax>6.0s) volume: 11mL Mismatch Volume: 65mL Infarction Location:Posterior right MCA territory IMPRESSION: 1. Short segment occlusion of the right middle cerebral artery proximal M2 segment. 2. 21 mL region of ischemia in the posterior right MCA territory by CT perfusion criteria. The CT perfusion data does not demonstrate a core infarct, but the ASPECT Score is 6 with findings of early infarction in the posterior right MCA territory. 3. Bilateral carotid bifurcation atherosclerosis with less than 50% stenosis. 4.  Aortic atherosclerosis (ICD10-I70.0). These results were called by telephone at the time of interpretation on 03/02/2018 at 6:07 pm to Dr. Daleen Bo , who verbally acknowledged these results. Electronically Signed   By: Ulyses Jarred M.D.   On: 03/02/2018 18:25   Ct Head Code Stroke Wo Contrast  Result Date: 03/02/2018 CLINICAL DATA:  Code stroke.  Left-sided weakness EXAM: CT HEAD WITHOUT CONTRAST TECHNIQUE: Contiguous axial images were obtained from the base of the skull through the vertex without intravenous contrast. COMPARISON:  None. FINDINGS: Brain: There is no mass, hemorrhage or extra-axial collection. The size and configuration of the ventricles and extra-axial CSF spaces are normal. There is hypoattenuation within the posterior right MCA territory suggesting acute or early subacute infarct. No midline shift or other mass effect. 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 Vascular: Mild hyperattenuation within the right MCA M2  segment. Bilateral distal ICA atherosclerotic calcification. Skull: The visualized skull base, calvarium and extracranial soft tissues are normal. Sinuses/Orbits: No fluid levels or advanced mucosal thickening of the visualized paranasal sinuses. No mastoid or middle ear effusion. The orbits are normal. ASPECTS [facility] Stroke Program Early CT Score) - Ganglionic level infarction (caudate, lentiform nuclei, internal capsule, insula, M1-M3 cortex): 5 - Supraganglionic infarction (M4-M6 cortex): 1 Total score (0-10 with 10 being normal): 6 IMPRESSION: 1. No acute hemorrhage. 2.  Hypoattenuation in the posterior right MCA territory consistent with acute or early subacute infarct. 3. ASPECTS is 6. These results were called by telephone at the time of interpretation on 03/02/2018 at 5:47 pm to Dr. Daleen Bo , who verbally acknowledged these results. Electronically Signed   By: Ulyses Jarred M.D.   On: 03/02/2018 17:53    PHYSICAL EXAM  frail cachectic looking elderly Caucasian lady not in distress. . Afebrile. Head is nontraumatic. Neck is supple without bruit.    Cardiac exam no murmur or gallop. Lungs are clear to auscultation. Distal pulses are well felt. Neurological Exam :  Awake alert oriented to time and place. Severe dysarthria but can be understood. Follows commands well. Patient refuses to open the right eye due to recent surgery and pain. Left eyes moves well. Tongue midline. Left lower facial weakness. Dense left hemiplegia with left upper extremity grade 1-2/5 strength in left lower extremity 0/5. Purposeful antigravity movements on the right side. Decreased left hemibody sensation. Mild left hemi-neglect. Deep tendon reflexes are depressed on the left. Left plantar upgoing right downgoing. Gait not tested.  ASSESSMENT/PLAN Ms. Brenda Hall is a 84 y.o. female with history of AF on Xarelto, HTN, CAD, HLD presenting to [facility] with left-sided weakness after awakening.   Stroke:  right  MCA infarct embolic secondary to known atrial fibrillation    Code Stroke CT head No acute hmg. hypoattenuation poster R MCA.  ASPECTS 6    CTA head & neck, CT perfusion R MCA M2 short occlusion. Posterior R MCA ischemia, no core. Bilateral ICA < 50% stenosis. Aortic atherosclerosis.  MRI pending   2D Echo  pending   LDL 67  HgbA1c 5.5  Heparin 5000 units sq tid for VTE prophylaxis  aspirin 81 mg daily and Xarelto (rivaroxaban) daily prior to admission, now on aspirin 300 mg suppository daily.  Hold anticoagulation given n.p.o. status and large stroke.  Will monitor for resumption  Therapy recommendations:  SNF vs CIR (Her grandson lives w. Her)  Disposition: Pending  Overall, she appears much better than her imaging.  While stroke is large and likely devastating, we will see how she does over the next few days.  If she is not able to swallow, will readdress with patient and family at that time.  Atrial Fibrillation  Home anticoagulation:  Xarelto (rivaroxaban) daily   She takes it daily prior to going to bed, taking only with a few sips of water.  Likely is not the most effective way to take Xarelto.  Recommend taking it with the largest meal of the day for best absorption and continued effectiveness throughout the 24-hour. . Consider change to Eliquis. Will discuss.  For now not an anticoagulation candidate secondary to large stroke and n.p.o. status.   . Continue aspirin for now    Dysphagia secondary to stroke  Failed swallow this morning.  Diet Order                   Diet NPO time specified  Diet effective now                 Speech therapy will follow  Hypertension  Stable . Permissive hypertension (OK if < 220/120) but gradually normalize in 5-7 days . Long-term BP goal normotensive  Hyperlipidemia  Home meds:  lipitor 80, resumed in hospital  LDL 67, goal < 70  Continue statin at discharge  Other Stroke Risk Factors  Advanced  age  ETOH use,  advised to drink no more than 1 drink(s) a day  UDS and ETOH neg  Hx stroke/TIA  03/2008 left posterior frontal deep white matter infarct  Coronary artery disease w/ stent 2007, MI  Other Active Problems  CKD stage III  Hx PMR recently slowly tapered off prednisone and started on abaloparatide. On hold for now  Hospital day # Bland, MSN, APRN, ANVP-BC, AGPCNP-BC Advanced Practice Stroke Nurse [place] for Schedule & Pager information 03/03/2018 11:48 AM  I have personally examined this patient, reviewed notes, independently viewed imaging studies, participated in medical decision making and plan of care.ROS completed by me personally and pertinent positives fully documented  I have made any additions or clarifications directly to the above note. Agree with note above. He presented with right MCA embolic infarct from atrial fibrillation despite taking Xarelto but was perhaps not taking it correctly as she was taking it at bedtime. She presented beyond time window for intervention due to aspects score being 6.recommends patient therapy for swallow eval of. Continue close monitoring for neurological worsening. Family wants full code. May consider switching Xarelto to eliquis when patient is able to swallow. Continue aspirin for now. Continue ongoing stroke workup. Some discussion at the bedside with patient's son and granddaughter and answered questions. Discussed with Dr. Tana Coast. Greater than 50% time during this 35 minute visit was spent on counseling and coordination of care but embolic stroke and answering questions.  Antony Contras, MD Medical Director [facility] Pager: [phone] 03/03/2018 5:00 PM  To contact Stroke Continuity provider, please refer to [email or web address] After hours, contact General Neurology

## 2018-03-03 NOTE — Progress Notes (Signed)
BSE completed, full report to follow.  Pt with obvious facial, trigeminal, hypoglossal and likely vagal/glossopharyngeal nerve involvement. Poor awareness to left buccal region residuals requireing slp to remove with toothette.  Pt is NOT eliciting a swallow stating "it hurts" but also suspect neuromotor involvement.  Trials of ice, thin water, cranberry juice provided - Hand over hand assist to elicit neural function provided but pt holds all boluses and allows anterior spillage on left.  Single swallow observed only of 7 boluses.  Recommend NPO with adequate oral care and SLP follow up for readiness for po.   Luanna Salk, [facility] SLP Acute Rehab Services Pager [phone] Office [phone]

## 2018-03-03 NOTE — Progress Notes (Signed)
[facility]                                                                              Patient Demographics  Brenda Hall, is a 84 y.o. female, DOB - [birth year], JOI:[id number]  Admit date - 03/02/2018   Admitting Physician Lenore Cordia, MD  Outpatient Primary MD for the patient is Luking, Grace Bushy, MD  Outpatient specialists:   LOS - 1  days   Medical records reviewed and are as summarized below:    Chief Complaint  Patient presents with  . Code Stroke       Brief summary   Patient is a 84 year-old female with history of atrial fibrillation on Xarelto, CAD, CVA, hypertension, CKD stage III, PMR presented to ED with left-sided weakness, facial droop and slurred speech, last normal seen around 9 AM on the day of admission. CT head showed changes consistent with acute or early subacute infarction and posterior right MCA territory, no hemorrhage.  CTA head and neck cerebral perfusion study showed short segment occlusion of right MCA proximal M2, neurology was consulted and patient was transferred to [facility] for further work-up.   Assessment & Plan    Principal Problem: Acute cerebrovascular accident (CVA) due to occlusion of right middle cerebral artery ([place]) -Presented with left-sided weakness, facial droop and dysarthria - CT head showed changes consistent with acute or early subacute infarction and posterior right MCA territory, no hemorrhage.  CTA head and neck cerebral perfusion study showed short segment occlusion of right MCA proximal M2 -Neurology consulted, recommended MRI of the brain, 2D echo, stroke work-up -For now continue aspirin 325 mg daily PO/PR, holding Xarelto, will defer to stroke service recommendations -OT evaluation recommended SNF versus CIR -Failed swallow testing, placed on n.p.o. status, IV fluids -Follow hemoglobin A1c, lipid panel  Active Problems:  Paroxysmal atrial fibrillation -Currently rate controlled, normal  sinus rhythm -Hold Toprol-XL for permissive hypertension, holding Xarelto  Essential hypertension Hold amlodipine and Toprol-XL for permissive hypertension  CAD Continue aspirin PR, currently n.p.o.  CKD stage III Creatinine currently at baseline, continue gentle hydration  PMR -Follows with Dr. Estanislado Pandy, has been tapered off of prednisone and started on abaloparatide, holding for now  Code Status: Full CODE STATUS DVT Prophylaxis: Heparin subcu Family Communication: Discussed in detail with the patient, all imaging results, lab results explained to the patient   Disposition Plan: Pending stroke work-up  Time Spent in minutes   35 minutes  Procedures:  CTA head and neck  Consultants:   Neurology  Antimicrobials:      Medications  Scheduled Meds: . aspirin  300 mg Rectal Daily   Or  . aspirin  325 mg Oral Daily  . atorvastatin  80 mg Oral QHS  . heparin  5,000 Units Subcutaneous Q8H  . pantoprazole  40 mg Oral q morning - 10a   Continuous Infusions: . sodium chloride     PRN Meds:.acetaminophen **OR** acetaminophen (TYLENOL) oral liquid 160 mg/5 mL **OR** acetaminophen, hydrALAZINE, senna-docusate   Antibiotics   Anti-infectives (From admission, onward)   None        Subjective:  Brenda Hall was seen and examined today.  Somnolent, arousable, follows commands.  Left -sided weakness.  Difficult to obtain review of system from the patient.  Objective:   Vitals:   03/03/18 0206 03/03/18 0344 03/03/18 0544 03/03/18 0726  BP: (!) 164/140 (!) 158/60 (!) 178/67 (!) 179/82  Pulse: 94 92 79 94  Resp: 17 20 (!) 22 (!) 35  Temp: 99.1 F (37.3 C) 99 F (37.2 C) 99 F (37.2 C) 99 F (37.2 C)  TempSrc: Oral Oral Oral Oral  SpO2: 96% 96% 98% 98%  Weight:      Height:       No intake or output data in the 24 hours ending 03/03/18 1159   Wt Readings from Last 3 Encounters:  03/02/18 69.6 kg  02/24/18 69.6 kg  01/27/18 68.9 kg      Exam  General: Somnolent but arousable, dysarthria  Eyes:   HEENT:  Atraumatic, normocephalic, normal oropharynx  Cardiovascular: S1 S2 auscultated, NSR  Respiratory: Clear to auscultation bilaterally, no wheezing, rales or rhonchi  Gastrointestinal: Soft, nontender, nondistended, + bowel sounds  Ext: no pedal edema bilaterally  Neuro: left-sided weakness persisting, dysarthria  Musculoskeletal: No digital cyanosis, clubbing  Skin: No rashes  Psych: somnolent   Data Reviewed:  I have personally reviewed following labs and imaging studies  Micro Results No results found for this or any previous visit (from the past 240 hour(s)).  Radiology Reports Ct Angio Head W Or Wo Contrast  Result Date: 03/02/2018 CLINICAL DATA:  Left-sided weakness EXAM: CT ANGIOGRAPHY HEAD AND NECK CT PERFUSION BRAIN TECHNIQUE: Multidetector CT imaging of the head and neck was performed using the standard protocol during bolus administration of intravenous contrast. Multiplanar CT image reconstructions and MIPs were obtained to evaluate the vascular anatomy. Carotid stenosis measurements (when applicable) are obtained utilizing NASCET criteria, using the distal internal carotid diameter as the denominator. Multiphase CT imaging of the brain was performed following IV bolus contrast injection. Subsequent parametric perfusion maps were calculated using RAPID software. CONTRAST:  63mL ISOVUE-370 IOPAMIDOL (ISOVUE-370) INJECTION 76% COMPARISON:  None. FINDINGS: CTA NECK FINDINGS SKELETON: There is no bony spinal canal stenosis. No lytic or blastic lesion. OTHER NECK: Normal pharynx, larynx and major salivary glands. No cervical lymphadenopathy. Unremarkable thyroid gland. UPPER CHEST: No pneumothorax or pleural effusion. No nodules or masses. AORTIC ARCH: There is mild calcific atherosclerosis of the aortic arch. There is no aneurysm, dissection or hemodynamically significant stenosis of the visualized  ascending aorta and aortic arch. Conventional 3 vessel aortic branching pattern. The visualized proximal subclavian arteries are widely patent. RIGHT CAROTID SYSTEM: --Common carotid artery: Widely patent origin without common carotid artery dissection or aneurysm. --Internal carotid artery: No dissection, occlusion or aneurysm. There is mixed density atherosclerosis extending into the proximal ICA, resulting in less than 50% stenosis. --External carotid artery: No acute abnormality. LEFT CAROTID SYSTEM: --Common carotid artery: Widely patent origin without common carotid artery dissection or aneurysm. --Internal carotid artery: No dissection, occlusion or aneurysm. There is mixed density atherosclerosis extending into the proximal ICA, resulting in less than 50% stenosis. --External carotid artery: No acute abnormality. VERTEBRAL ARTERIES: Left dominant configuration. There is moderate narrowing of both vertebral artery origins. No dissection, occlusion or flow-limiting stenosis to the vertebrobasilar confluence. CTA HEAD FINDINGS ANTERIOR CIRCULATION: --Intracranial internal carotid arteries: Atherosclerotic calcification of the internal carotid arteries at the skull base without hemodynamically significant stenosis. --Anterior cerebral arteries: Normal. Both A1 segments are present. Patent anterior communicating artery. --Middle cerebral arteries:  There is a short segment occlusion of the proximal right M2 segment. The length of the occluded segment is approximately 5 mm. --Posterior communicating arteries: Present bilaterally. POSTERIOR CIRCULATION: --Basilar artery: Normal. --Posterior cerebral arteries: Normal. --Superior cerebellar arteries: Normal. --Inferior cerebellar arteries: Normal anterior and posterior inferior cerebellar arteries. VENOUS SINUSES: As permitted by contrast timing, patent. ANATOMIC VARIANTS: None DELAYED PHASE: No parenchymal contrast enhancement. Review of the MIP images confirms the  above findings. ASPECTS ([place] Stroke Program Early CT Score) - Ganglionic level infarction (caudate, lentiform nuclei, internal capsule, insula, M1-M3 cortex): 5 - Supraganglionic infarction (M4-M6 cortex): 1 Total score (0-10 with 10 being normal): 6 CT Brain Perfusion Findings: CBF (<30%) Volume: 75mL Perfusion (Tmax>6.0s) volume: 4mL Mismatch Volume: 82mL Infarction Location:Posterior right MCA territory IMPRESSION: 1. Short segment occlusion of the right middle cerebral artery proximal M2 segment. 2. 21 mL region of ischemia in the posterior right MCA territory by CT perfusion criteria. The CT perfusion data does not demonstrate a core infarct, but the ASPECT Score is 6 with findings of early infarction in the posterior right MCA territory. 3. Bilateral carotid bifurcation atherosclerosis with less than 50% stenosis. 4.  Aortic atherosclerosis (ICD10-I70.0). These results were called by telephone at the time of interpretation on 03/02/2018 at 6:07 pm to Dr. Daleen Bo , who verbally acknowledged these results. Electronically Signed   By: Ulyses Jarred M.D.   On: 03/02/2018 18:25   Ct Angio Neck W And/or Wo Contrast  Result Date: 03/02/2018 CLINICAL DATA:  Left-sided weakness EXAM: CT ANGIOGRAPHY HEAD AND NECK CT PERFUSION BRAIN TECHNIQUE: Multidetector CT imaging of the head and neck was performed using the standard protocol during bolus administration of intravenous contrast. Multiplanar CT image reconstructions and MIPs were obtained to evaluate the vascular anatomy. Carotid stenosis measurements (when applicable) are obtained utilizing NASCET criteria, using the distal internal carotid diameter as the denominator. Multiphase CT imaging of the brain was performed following IV bolus contrast injection. Subsequent parametric perfusion maps were calculated using RAPID software. CONTRAST:  71mL ISOVUE-370 IOPAMIDOL (ISOVUE-370) INJECTION 76% COMPARISON:  None. FINDINGS: CTA NECK FINDINGS SKELETON: There  is no bony spinal canal stenosis. No lytic or blastic lesion. OTHER NECK: Normal pharynx, larynx and major salivary glands. No cervical lymphadenopathy. Unremarkable thyroid gland. UPPER CHEST: No pneumothorax or pleural effusion. No nodules or masses. AORTIC ARCH: There is mild calcific atherosclerosis of the aortic arch. There is no aneurysm, dissection or hemodynamically significant stenosis of the visualized ascending aorta and aortic arch. Conventional 3 vessel aortic branching pattern. The visualized proximal subclavian arteries are widely patent. RIGHT CAROTID SYSTEM: --Common carotid artery: Widely patent origin without common carotid artery dissection or aneurysm. --Internal carotid artery: No dissection, occlusion or aneurysm. There is mixed density atherosclerosis extending into the proximal ICA, resulting in less than 50% stenosis. --External carotid artery: No acute abnormality. LEFT CAROTID SYSTEM: --Common carotid artery: Widely patent origin without common carotid artery dissection or aneurysm. --Internal carotid artery: No dissection, occlusion or aneurysm. There is mixed density atherosclerosis extending into the proximal ICA, resulting in less than 50% stenosis. --External carotid artery: No acute abnormality. VERTEBRAL ARTERIES: Left dominant configuration. There is moderate narrowing of both vertebral artery origins. No dissection, occlusion or flow-limiting stenosis to the vertebrobasilar confluence. CTA HEAD FINDINGS ANTERIOR CIRCULATION: --Intracranial internal carotid arteries: Atherosclerotic calcification of the internal carotid arteries at the skull base without hemodynamically significant stenosis. --Anterior cerebral arteries: Normal. Both A1 segments are present. Patent anterior communicating artery. --Middle cerebral arteries: There is  a short segment occlusion of the proximal right M2 segment. The length of the occluded segment is approximately 5 mm. --Posterior communicating  arteries: Present bilaterally. POSTERIOR CIRCULATION: --Basilar artery: Normal. --Posterior cerebral arteries: Normal. --Superior cerebellar arteries: Normal. --Inferior cerebellar arteries: Normal anterior and posterior inferior cerebellar arteries. VENOUS SINUSES: As permitted by contrast timing, patent. ANATOMIC VARIANTS: None DELAYED PHASE: No parenchymal contrast enhancement. Review of the MIP images confirms the above findings. ASPECTS ([place] Stroke Program Early CT Score) - Ganglionic level infarction (caudate, lentiform nuclei, internal capsule, insula, M1-M3 cortex): 5 - Supraganglionic infarction (M4-M6 cortex): 1 Total score (0-10 with 10 being normal): 6 CT Brain Perfusion Findings: CBF (<30%) Volume: 35mL Perfusion (Tmax>6.0s) volume: 25mL Mismatch Volume: 30mL Infarction Location:Posterior right MCA territory IMPRESSION: 1. Short segment occlusion of the right middle cerebral artery proximal M2 segment. 2. 21 mL region of ischemia in the posterior right MCA territory by CT perfusion criteria. The CT perfusion data does not demonstrate a core infarct, but the ASPECT Score is 6 with findings of early infarction in the posterior right MCA territory. 3. Bilateral carotid bifurcation atherosclerosis with less than 50% stenosis. 4.  Aortic atherosclerosis (ICD10-I70.0). These results were called by telephone at the time of interpretation on 03/02/2018 at 6:07 pm to Dr. Daleen Bo , who verbally acknowledged these results. Electronically Signed   By: Ulyses Jarred M.D.   On: 03/02/2018 18:25   Ct Cerebral Perfusion W Contrast  Result Date: 03/02/2018 CLINICAL DATA:  Left-sided weakness EXAM: CT ANGIOGRAPHY HEAD AND NECK CT PERFUSION BRAIN TECHNIQUE: Multidetector CT imaging of the head and neck was performed using the standard protocol during bolus administration of intravenous contrast. Multiplanar CT image reconstructions and MIPs were obtained to evaluate the vascular anatomy. Carotid stenosis  measurements (when applicable) are obtained utilizing NASCET criteria, using the distal internal carotid diameter as the denominator. Multiphase CT imaging of the brain was performed following IV bolus contrast injection. Subsequent parametric perfusion maps were calculated using RAPID software. CONTRAST:  106mL ISOVUE-370 IOPAMIDOL (ISOVUE-370) INJECTION 76% COMPARISON:  None. FINDINGS: CTA NECK FINDINGS SKELETON: There is no bony spinal canal stenosis. No lytic or blastic lesion. OTHER NECK: Normal pharynx, larynx and major salivary glands. No cervical lymphadenopathy. Unremarkable thyroid gland. UPPER CHEST: No pneumothorax or pleural effusion. No nodules or masses. AORTIC ARCH: There is mild calcific atherosclerosis of the aortic arch. There is no aneurysm, dissection or hemodynamically significant stenosis of the visualized ascending aorta and aortic arch. Conventional 3 vessel aortic branching pattern. The visualized proximal subclavian arteries are widely patent. RIGHT CAROTID SYSTEM: --Common carotid artery: Widely patent origin without common carotid artery dissection or aneurysm. --Internal carotid artery: No dissection, occlusion or aneurysm. There is mixed density atherosclerosis extending into the proximal ICA, resulting in less than 50% stenosis. --External carotid artery: No acute abnormality. LEFT CAROTID SYSTEM: --Common carotid artery: Widely patent origin without common carotid artery dissection or aneurysm. --Internal carotid artery: No dissection, occlusion or aneurysm. There is mixed density atherosclerosis extending into the proximal ICA, resulting in less than 50% stenosis. --External carotid artery: No acute abnormality. VERTEBRAL ARTERIES: Left dominant configuration. There is moderate narrowing of both vertebral artery origins. No dissection, occlusion or flow-limiting stenosis to the vertebrobasilar confluence. CTA HEAD FINDINGS ANTERIOR CIRCULATION: --Intracranial internal carotid  arteries: Atherosclerotic calcification of the internal carotid arteries at the skull base without hemodynamically significant stenosis. --Anterior cerebral arteries: Normal. Both A1 segments are present. Patent anterior communicating artery. --Middle cerebral arteries: There is a short segment  occlusion of the proximal right M2 segment. The length of the occluded segment is approximately 5 mm. --Posterior communicating arteries: Present bilaterally. POSTERIOR CIRCULATION: --Basilar artery: Normal. --Posterior cerebral arteries: Normal. --Superior cerebellar arteries: Normal. --Inferior cerebellar arteries: Normal anterior and posterior inferior cerebellar arteries. VENOUS SINUSES: As permitted by contrast timing, patent. ANATOMIC VARIANTS: None DELAYED PHASE: No parenchymal contrast enhancement. Review of the MIP images confirms the above findings. ASPECTS ([place] Stroke Program Early CT Score) - Ganglionic level infarction (caudate, lentiform nuclei, internal capsule, insula, M1-M3 cortex): 5 - Supraganglionic infarction (M4-M6 cortex): 1 Total score (0-10 with 10 being normal): 6 CT Brain Perfusion Findings: CBF (<30%) Volume: 60mL Perfusion (Tmax>6.0s) volume: 87mL Mismatch Volume: 68mL Infarction Location:Posterior right MCA territory IMPRESSION: 1. Short segment occlusion of the right middle cerebral artery proximal M2 segment. 2. 21 mL region of ischemia in the posterior right MCA territory by CT perfusion criteria. The CT perfusion data does not demonstrate a core infarct, but the ASPECT Score is 6 with findings of early infarction in the posterior right MCA territory. 3. Bilateral carotid bifurcation atherosclerosis with less than 50% stenosis. 4.  Aortic atherosclerosis (ICD10-I70.0). These results were called by telephone at the time of interpretation on 03/02/2018 at 6:07 pm to Dr. Daleen Bo , who verbally acknowledged these results. Electronically Signed   By: Ulyses Jarred M.D.   On: 03/02/2018  18:25   Ct Head Code Stroke Wo Contrast  Result Date: 03/02/2018 CLINICAL DATA:  Code stroke.  Left-sided weakness EXAM: CT HEAD WITHOUT CONTRAST TECHNIQUE: Contiguous axial images were obtained from the base of the skull through the vertex without intravenous contrast. COMPARISON:  None. FINDINGS: Brain: There is no mass, hemorrhage or extra-axial collection. The size and configuration of the ventricles and extra-axial CSF spaces are normal. There is hypoattenuation within the posterior right MCA territory suggesting acute or early subacute infarct. No midline shift or other mass effect. 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 Vascular: Mild hyperattenuation within the right MCA M2 segment. Bilateral distal ICA atherosclerotic calcification. Skull: The visualized skull base, calvarium and extracranial soft tissues are normal. Sinuses/Orbits: No fluid levels or advanced mucosal thickening of the visualized paranasal sinuses. No mastoid or middle ear effusion. The orbits are normal. ASPECTS [facility] Stroke Program Early CT Score) - Ganglionic level infarction (caudate, lentiform nuclei, internal capsule, insula, M1-M3 cortex): 5 - Supraganglionic infarction (M4-M6 cortex): 1 Total score (0-10 with 10 being normal): 6 IMPRESSION: 1. No acute hemorrhage. 2. Hypoattenuation in the posterior right MCA territory consistent with acute or early subacute infarct. 3. ASPECTS is 6. These results were called by telephone at the time of interpretation on 03/02/2018 at 5:47 pm to Dr. Daleen Bo , who verbally acknowledged these results. Electronically Signed   By: Ulyses Jarred M.D.   On: 03/02/2018 17:53    Lab Data:  CBC: Recent Labs  Lab 03/02/18 1729 03/02/18 1730  WBC 7.7  --   NEUTROABS 5.2  --   HGB 11.3* 12.9  HCT 38.0 38.0  MCV 89.6  --   PLT 381  --    Basic Metabolic Panel: Recent Labs  Lab 03/02/18 1729 03/02/18 1730  NA 138 140  K  4.1 4.4  CL 103 103  CO2 26  --   GLUCOSE 117* 114*  BUN 13 14  CREATININE 1.20* 1.20*  CALCIUM 8.9  --    GFR: Estimated Creatinine Clearance: 33.5 mL/min (A) (by C-G formula  based on SCr of 1.2 mg/dL (H)). Liver Function Tests: Recent Labs  Lab 03/02/18 1729  AST 20  ALT 12  ALKPHOS 83  BILITOT 0.9  PROT 7.6  ALBUMIN 3.6   No results for input(s): LIPASE, AMYLASE in the last 168 hours. No results for input(s): AMMONIA in the last 168 hours. Coagulation Profile: Recent Labs  Lab 03/02/18 1729  INR 1.73   Cardiac Enzymes: No results for input(s): CKTOTAL, CKMB, CKMBINDEX, TROPONINI in the last 168 hours. BNP (last 3 results) No results for input(s): PROBNP in the last 8760 hours. HbA1C: No results for input(s): HGBA1C in the last 72 hours. CBG: Recent Labs  Lab 03/02/18 1725  GLUCAP 112*   Lipid Profile: No results for input(s): CHOL, HDL, LDLCALC, TRIG, CHOLHDL, LDLDIRECT in the last 72 hours. Thyroid Function Tests: No results for input(s): TSH, T4TOTAL, FREET4, T3FREE, THYROIDAB in the last 72 hours. Anemia Panel: No results for input(s): VITAMINB12, FOLATE, FERRITIN, TIBC, IRON, RETICCTPCT in the last 72 hours. Urine analysis:    Component Value Date/Time   COLORURINE YELLOW 03/02/2018 1729   APPEARANCEUR HAZY (A) 03/02/2018 1729   LABSPEC 1.023 03/02/2018 1729   PHURINE 8.0 03/02/2018 1729   GLUCOSEU NEGATIVE 03/02/2018 1729   HGBUR NEGATIVE 03/02/2018 1729   BILIRUBINUR NEGATIVE 03/02/2018 1729   KETONESUR NEGATIVE 03/02/2018 1729   PROTEINUR NEGATIVE 03/02/2018 1729   UROBILINOGEN 0.2 07/28/2008 1156   NITRITE NEGATIVE 03/02/2018 1729   LEUKOCYTESUR NEGATIVE 03/02/2018 1729     Zelene Barga M.D. [facility] 03/03/2018, 11:59 AM  Pager: [phone] Between 7am to 7pm - call Pager - [phone]  After 7pm go to [email or web address] - password [email or web address]  Call night coverage person covering after 7pm

## 2018-03-03 NOTE — Consult Note (Signed)
Neurology Consultation Reason for Consult: Stroke Referring Physician: Posey Pronto, V  CC: Left-sided weakness  History is obtained from: Patient  HPI: Brenda Hall is a 84 y.o. female who apparently was last known to be normal around 9 AM, though I am not certain of the exact time and the patient states that everything started at 4 PM when she awoke.  I am not sure wanted actually started given that I think neglect is a prominent component to her symptoms.  In any case she was taken to [facility] where CT/CTA/CTP was performed which demonstrated a moderate sized infarct on plain CT.  CTA was performed which demonstrated a M2 occlusion and though the perfusion looked amenable, this appeared to be pseudonormalization based on the fact that the area that appeared to be penumbra was actually infarcted on CT.   LKW: Unclear, possibly 9 AM tpa given?: no, unclear time of onset Premorbid modified rankin scale: 0   ROS: A 14 point ROS was performed and is negative except as noted in the HPI.   Past Medical History:  Diagnosis Date  . Anemia   . CAD in native artery    a. NSTEMI 12/2005 s/p BMS to Cx, prior normal LVEF.  Mar[facility] Kitchen Cancer ([place]) 1988 and  2009   BCC nose and  Melanoma back  . Cancer of skin Jan. 2014   [facility] Forehead and Nose  . Carotid stenosis    mild  . Chronic back pain   . CKD (chronic kidney disease), stage III ([place])   . Coronary atherosclerosis of unspecified type of vessel, native or graft   . DJD (degenerative joint disease) of knee   . History of TIA (transient ischemic attack)   . HTN (hypertension)   . IBS (irritable bowel syndrome)   . Myocardial infarction [facility])    5 yrs ago     dr wall  cardiac  . Osteoporosis   . Other diseases of lung, not elsewhere classified    lung nodule  . Polymyalgia rheumatica ([place])   . Shingles 01/2016-02/2016  . Stroke [facility])      Family History  Problem Relation Age of Onset  . Heart disease Father   . Hypertension Father    . Heart attack Father   . Cancer Mother        bladder   . Hypertension Daughter   . Atrial fibrillation Daughter   . Hypertension Son   . Colon cancer Neg Hx      Social History:  reports that she has never smoked. She has never used smokeless tobacco. She reports that she drinks alcohol. She reports that she does not use drugs.   Exam: Current vital signs: BP (!) 158/60 (BP Location: Right Arm)   Pulse 92   Temp 99 F (37.2 C) (Oral)   Resp 20   Ht 5\' 5"  (1.651 m)   Wt 69.6 kg   SpO2 96%   BMI 25.53 kg/m  Vital signs in last 24 hours: Temp:  [99 F (37.2 C)-101.2 F (38.4 C)] 99 F (37.2 C) (11/12 0344) Pulse Rate:  [75-112] 92 (11/12 0344) Resp:  [15-25] 20 (11/12 0344) BP: (158-181)/(58-140) 158/60 (11/12 0344) SpO2:  [91 %-99 %] 96 % (11/12 0344) FiO2 (%):  [21 %] 21 % (11/11 1923) Weight:  [69.6 kg] 69.6 kg (11/11 1724)   Physical Exam  Constitutional: Appears well-developed and well-nourished.  Psych: Affect appropriate to situation Eyes: No scleral injection HENT: No OP obstrucion Head: Normocephalic.  Cardiovascular: Normal rate and regular rhythm.  Respiratory: Effort normal, non-labored breathing GI: Soft.  No distension. There is no tenderness.  Skin: WDI  Neuro: Mental Status: Patient is awake, alert, oriented to person, place, month, year, and situation. Patient is able to give a clear and coherent history. She is dysarthric and has a dense hemineglect Cranial Nerves: II: She endorses seeing fingers wiggle in all 4 visual fields pupils are equal, round, and reactive to light.   III,IV, VI: She has a right gaze preference, keeps her eyes tightly closed and will not cooperate with formal evaluation V: Facial sensation is diminished on the left VII: Facial movement is diminished on the left XII: tongue with slight leftward deviation Motor: She has good strength in the right arm and leg, but on the left she has 3/5 strength of the left arm and  leg Sensory: Sensation is diminished on the left, she extinguishes to double simultaneous stimulation Cerebellar: No clear ataxia on the right   I have reviewed labs in epic and the results pertinent to this consultation are: Creatinine 1.2  I have reviewed the images obtained: CT/CTA- right M2 occlusion with corresponding infarct on CT, CTP- likely misrepresented core infarct based on CT scan.  Impression: 84 year old female with left M2 occlusion.  The location would suggest embolic phenomenon, likely due to known atrial fibrillation.  Given the size, I would favor holding anticoagulation for the time being.  Recommendations: - HgbA1c, fasting lipid panel - MRI of the brain without contrast - Frequent neuro checks - Echocardiogram - Prophylactic therapy-Antiplatelet med: Aspirin - dose 325mg  PO or 300mg  PR - Risk factor modification - Telemetry monitoring - PT consult, OT consult, Speech consult - Stroke team to follow    Roland Rack, MD [facility] [phone]  If 7pm- 7am, please page neurology on call as listed in [place].

## 2018-03-03 NOTE — Evaluation (Signed)
Occupational Therapy Evaluation Patient Details Name: Brenda Hall MRN: [id number] DOB: 06/16/1930 Today's Date: 03/03/2018    History of Present Illness Pt is a 84 y/o female with PMH significant for atrial fibrillation on Xarelto, CAD, history of CVA, hypertension, CKD stage III, and polymyalgia rheumatica admitted with L sided weakness, facial droop and slurred speech.  Hx of R eye surgery 4 days ago and R eye drooping since. CT reveals acute/early subacute infarct in the posterior R MCA territory. Admitted for further stroke workup.     Clinical Impression   PTA patient independent with ADLs and mobility using rollator.  Patient admitted for above and limited by decreased activity tolerance, pain, impaired cognition, L sided hemiparesis, impaired vision, and impaired balance.  Patient currently requires mod assist for grooming, max to total assist +2 for LB ADLs bed level, max-total assist for UB ADLs, and max-total assist +2 for bed mobility (attempted EOB mobility but patient limited by pain and cognition, pushing against therapist to avoid movement at this time).  Believe patient will benefit from intensive therapies prior to dc home, recommending acute rehab CIR vs SNF pending progress and participation.  Will continue to follow and updated recommendations as patient progresses.      Follow Up Recommendations  Supervision/Assistance - 24 hour(post acute rehab (SNF vs CIR) pending progress )    Equipment Recommendations  Other (comment)(TBD at next venue of care)    Recommendations for Other Services PT consult;Rehab consult     Precautions / Restrictions Precautions Precautions: Fall Restrictions Weight Bearing Restrictions: No      Mobility Bed Mobility Overal bed mobility: Needs Assistance Bed Mobility: Rolling Rolling: Max assist;Total assist;+2 for physical assistance         General bed mobility comments: rolling with max to total assist, +2 required for ADLs  due to decreased sustained activity; attempted transition to EOB but patient pushing against therapist due to back/neck pain; requires +2 to boost up in bed  Transfers                 General transfer comment: deferred due to safety    Balance                                           ADL either performed or assessed with clinical judgement   ADL Overall ADL's : Needs assistance/impaired Eating/Feeding: NPO   Grooming: Moderate assistance;Bed level   Upper Body Bathing: Maximal assistance;Bed level   Lower Body Bathing: Total assistance;+2 for physical assistance;Bed level   Upper Body Dressing : Bed level;Total assistance   Lower Body Dressing: +2 for physical assistance;Total assistance;Bed level     Toilet Transfer Details (indicate cue type and reason): deferred due to safety Toileting- Clothing Manipulation and Hygiene: Total assistance;+2 for physical assistance;Bed level Toileting - Clothing Manipulation Details (indicate cue type and reason): pt reports "laying on a wet pad", therapist changed at bed level and pad was dry     Functional mobility during ADLs: Maximal assistance;Total assistance;+2 for physical assistance(bed mobility only) General ADL Comments: limited eval.  pt requires increased time to follow commands, perseverating on soreness in throat this morning, and pushing against therapist with mobility towards EOB     Vision Baseline Vision/History: Wears glasses Wears Glasses: Reading only Patient Visual Report: Other (comment)(surgery on R eye for tear ducts repair after cancer ) Vision Assessment?:  Vision impaired- to be further tested in functional context Additional Comments: difficult to assess, maintained eyes closed during session; with cueing able to open eyes but hx of R eye surgery and reports uncomfortable; further assessment needed     Perception     Praxis      Pertinent Vitals/Pain Pain Assessment: Faces Faces  Pain Scale: Hurts little more Pain Location: throat soreness, back/neck with mobility Pain Descriptors / Indicators: Discomfort;Sore Pain Intervention(s): Monitored during session;Repositioned(RN notified )     Hand Dominance Right   Extremity/Trunk Assessment Upper Extremity Assessment Upper Extremity Assessment: LUE deficits/detail LUE Deficits / Details: Grossly 3-/5 proximally and 0/5 distally (wrist/hand), absent sensation LUE Sensation: decreased light touch;decreased proprioception LUE Coordination: decreased fine motor;decreased gross motor   Lower Extremity Assessment Lower Extremity Assessment: Defer to PT evaluation       Communication Communication Communication: No difficulties   Cognition Arousal/Alertness: Lethargic Behavior During Therapy: Flat affect Overall Cognitive Status: No family/caregiver present to determine baseline cognitive functioning Area of Impairment: Attention;Memory;Following commands;Safety/judgement;Awareness;Problem solving                   Current Attention Level: Sustained Memory: Decreased recall of precautions;Decreased short-term memory Following Commands: Follows one step commands inconsistently;Follows one step commands with increased time Safety/Judgement: Decreased awareness of safety;Decreased awareness of deficits Awareness: Emergent Problem Solving: Slow processing;Decreased initiation;Difficulty sequencing;Requires verbal cues;Requires tactile cues     General Comments       Exercises     Shoulder Instructions      Home Living Family/patient expects to be discharged to:: Private residence Living Arrangements: Other relatives(grandson at night ) Available Help at Discharge: Family;Available PRN/intermittently Type of Home: House Home Access: Stairs to enter [employer] of Steps: 3 Entrance Stairs-Rails: Right;Left;Can reach both Home Layout: One level     Bathroom Shower/Tub: Medical illustrator: Handicapped height Bathroom Accessibility: Yes   Home Equipment: Walker - 4 wheels;Grab bars - tub/shower;Shower seat - built in;Bedside commode;Hand held shower head          Prior Functioning/Environment Level of Independence: Independent with assistive device(s)        Comments: household ambulator with Rollator, + driving, light IADLs, indepedent ADLs          OT Problem List: Decreased strength;Decreased range of motion;Decreased activity tolerance;Impaired balance (sitting and/or standing);Impaired vision/perception;Decreased coordination;Decreased cognition;Decreased safety awareness;Decreased knowledge of use of DME or AE;Decreased knowledge of precautions;Impaired sensation;Impaired tone;Impaired UE functional use;Pain      OT Treatment/Interventions: Self-care/ADL training;Therapeutic exercise;Neuromuscular education;Energy conservation;DME and/or AE instruction;Manual therapy;Therapeutic activities;Cognitive remediation/compensation;Visual/perceptual remediation/compensation;Patient/family education;Balance training    OT Goals(Current goals can be found in the care plan section) Acute Rehab OT Goals Patient Stated Goal: to be able to have a cold drink OT Goal Formulation: With patient Time For Goal Achievement: 03/17/18 Potential to Achieve Goals: Good  OT Frequency: Min 3X/week   Barriers to D/C:            Co-evaluation              AM-PAC PT "6 Clicks" Daily Activity     Outcome Measure Help from another person eating meals?: Total Help from another person taking care of personal grooming?: A Lot Help from another person toileting, which includes using toliet, bedpan, or urinal?: Total Help from another person bathing (including washing, rinsing, drying)?: Total Help from another person to put on and taking off regular upper body clothing?: Total Help from another person to put  on and taking off regular lower body clothing?: Total 6  Click Score: 7   End of Session Nurse Communication: Mobility status;Other (comment)(pain)  Activity Tolerance: Patient limited by lethargy;Patient limited by pain Patient left: in bed;with call bell/phone within reach;Other (comment)(SLP in room)  OT Visit Diagnosis: Other abnormalities of gait and mobility (R26.89);Muscle weakness (generalized) (M62.81);Other symptoms and signs involving cognitive function;Low vision, both eyes (H54.2);Hemiplegia and hemiparesis;Pain Hemiplegia - Right/Left: Left Hemiplegia - dominant/non-dominant: Non-Dominant Hemiplegia - caused by: Cerebral infarction Pain - part of body: (back, neck, throat )                Time: 1740-8144 OT Time Calculation (min): 27 min Charges:  OT General Charges $OT Visit: 1 Visit OT Evaluation $OT Eval Moderate Complexity: 1 Mod OT Treatments $Self Care/Home Management : 8-22 mins  Delight Stare, OT Acute Rehabilitation Services Pager [phone] Office [phone]   Delight Stare 03/03/2018, 8:33 AM

## 2018-03-03 NOTE — Progress Notes (Signed)
Rehab Admissions Coordinator Note:  Patient was screened by Cleatrice Burke for appropriateness for an Inpatient Acute Rehab Consult per OT recommendation.  At this time, we are recommending await further progress with tolerance for more intense therapies..Patient not at a level to consider CIR yet. I will follow.  Cleatrice Burke 03/03/2018, 8:49 AM  I can be reached at [phone].

## 2018-03-04 ENCOUNTER — Telehealth: Payer: Self-pay | Admitting: Rheumatology

## 2018-03-04 ENCOUNTER — Inpatient Hospital Stay (HOSPITAL_COMMUNITY): Payer: Medicare Other

## 2018-03-04 DIAGNOSIS — I25118 Atherosclerotic heart disease of native coronary artery with other forms of angina pectoris: Secondary | ICD-10-CM

## 2018-03-04 DIAGNOSIS — I10 Essential (primary) hypertension: Secondary | ICD-10-CM

## 2018-03-04 DIAGNOSIS — I639 Cerebral infarction, unspecified: Secondary | ICD-10-CM

## 2018-03-04 DIAGNOSIS — I480 Paroxysmal atrial fibrillation: Secondary | ICD-10-CM

## 2018-03-04 DIAGNOSIS — N183 Chronic kidney disease, stage 3 (moderate): Secondary | ICD-10-CM

## 2018-03-04 DIAGNOSIS — M353 Polymyalgia rheumatica: Secondary | ICD-10-CM

## 2018-03-04 LAB — BASIC METABOLIC PANEL
Anion gap: 11 (ref 5–15)
BUN: 15 mg/dL (ref 8–23)
CALCIUM: 8.4 mg/dL — AB (ref 8.9–10.3)
CO2: 20 mmol/L — ABNORMAL LOW (ref 22–32)
Chloride: 106 mmol/L (ref 98–111)
Creatinine, Ser: 1.19 mg/dL — ABNORMAL HIGH (ref 0.44–1.00)
GFR calc Af Amer: 47 mL/min — ABNORMAL LOW (ref >60)
GFR, EST NON AFRICAN AMERICAN: 40 mL/min — AB (ref >60)
GLUCOSE: 83 mg/dL (ref 70–99)
POTASSIUM: 4.4 mmol/L (ref 3.5–5.1)
SODIUM: 137 mmol/L (ref 135–145)

## 2018-03-04 LAB — ECHOCARDIOGRAM COMPLETE
HEIGHTINCHES: 65 in
Weight: 2455.04 oz

## 2018-03-04 LAB — CBC
HCT: 37 % (ref 36.0–46.0)
Hemoglobin: 11.3 g/dL — ABNORMAL LOW (ref 12.0–15.0)
MCH: 27 pg (ref 26.0–34.0)
MCHC: 30.5 g/dL (ref 30.0–36.0)
MCV: 88.3 fL (ref 80.0–100.0)
NRBC: 0 % (ref 0.0–0.2)
PLATELETS: 345 10*3/uL (ref 150–400)
RBC: 4.19 MIL/uL (ref 3.87–5.11)
RDW: 15.3 % (ref 11.5–15.5)
WBC: 6.9 10*3/uL (ref 4.0–10.5)

## 2018-03-04 NOTE — Progress Notes (Signed)
PROGRESS NOTE    Brenda Hall  TDV:[id number] DOB: Sep 06, 1930 DOA: 03/02/2018 PCP: [name], MD   Brief Narrative:  HPI On 03/02/2018 by Dr. Zada Finders Brenda Hall is a 84 y.o. female with medical history significant for atrial fibrillation on Xarelto, CAD, history of CVA, hypertension, CKD stage III, and polymyalgia rheumatica who presents to the ED with left-sided weakness, facial droop, and slurred speech.  Time last well-known was around 9 AM.  Patient is able to provide most of recent history.  Her son and daughter-in-law are present during the time of my evaluation and provide additional history.  Patient states she has recently been dealing with a upper respiratory infection which has not been improving with oral azithromycin.  She called her PCP today and was prescribed Augmentin.  Her grandson visited her at home and saw her sitting down in the chair.  Around 4 PM she attempted to get up but was having difficulty lifting her left leg off the floor.  She was also having slurred speech and she was noted to have drooping at the left side of her face.  She says she had eye surgery on her right eye 4 days ago with right eye drooping since that time.  She has been on Xarelto for atrial fibrillation and says she has been taking it every night and has not seen any obvious bleeding.  She also reports recently completing a slow taper off of oral prednisone for treatment of her polymyalgia rheumatica.  She denies any chest pain, palpitations, dyspnea, abdominal pain, or dysuria.  Interim history Admitted for acute CVA.  Neurology consulted and appreciated, work-up pending. Assessment & Plan   Acute CVA  -presented with left-sided weakness, facial droop and dysarthria -CT head showed changes consistent with acute or early subacute infarction and posterior right MCA territory, no hemorrhage -CTA head and neck cerebral perfusion study showed short segment occlusion of right  MCA proximal M2 -MRI large right MCA territory infarct without hemorrhage or mass-effect. -Echocardiogram ending -Hemoglobin A1c 5.5, LDL 65 -Neurology consulted and appreciated, pending further recommendations -Pending speech, PT evaluations -OT recommended CIR  Paroxysmal atrial fibrillation -Currently rate controlled  -Toprol held permissive hypertension -Also held due to dysphasia and pending further recommendations from neurology  Essential hypertension -Amlodipine and Toprol held for permissive hypertension  CAD -Continue aspirin rectally  CKD stage III -Creatinine appears to be at baseline, continue to monitor BMP  PMR -Patient follows with Dr. Estanislado Pandy -She has been tapered off of prednisone and started on abaloparatide- which is held for now  DVT Prophylaxis  Heparin  Code Status: Full  Family Communication: None at bedside  Disposition Plan: Admitted. Pending workup and further neurology recommendations  Consultants Neurology  Procedures  None  Antibiotics   Anti-infectives (From admission, onward)   None      Subjective:   Brenda Hall seen and examined today.  Patient not very interactive today.  Only complains of dry throat and wanting to drink water.  Does not wake up enough for full conversation.  Objective:   Vitals:   03/03/18 2054 03/04/18 0125 03/04/18 0400 03/04/18 0710  BP: (!) 153/94 (!) 152/78 (!) 141/114 (!) 182/158  Pulse:  83 97 92  Resp:  (!) 21 (!) 21 19  Temp: 98.5 F (36.9 C) 98.6 F (37 C) 98.3 F (36.8 C) 98.9 F (37.2 C)  TempSrc: Oral Axillary Axillary Axillary  SpO2:  97% 98% 96%  Weight:  Height:        Intake/Output Summary (Last 24 hours) at 03/04/2018 1317 Last data filed at 03/04/2018 0500 Gross per 24 hour  Intake 400 ml  Output -  Net 400 ml   Filed Weights   03/02/18 1724  Weight: 69.6 kg    Exam  General: Well developed, well nourished, NAD, appears stated age  23: NCAT, mucous  membranes moist.   Neck: Supple  Cardiovascular: S1 S2 auscultated, RRR  Respiratory: Clear to auscultation bilaterally with equal chest rise  Abdomen: Soft, nontender, nondistended, + bowel sounds  Extremities: warm dry without cyanosis clubbing or edema  Neuro: cannot fully assess as patient is not cooperative  Psych: cannot fully assess as patient is somnolent   Data Reviewed: I have personally reviewed following labs and imaging studies  CBC: Recent Labs  Lab 03/02/18 1729 03/02/18 1730 03/04/18 0428  WBC 7.7  --  6.9  NEUTROABS 5.2  --   --   HGB 11.3* 12.9 11.3*  HCT 38.0 38.0 37.0  MCV 89.6  --  88.3  PLT 381  --  350   Basic Metabolic Panel: Recent Labs  Lab 03/02/18 1729 03/02/18 1730 03/04/18 0428  NA 138 140 137  K 4.1 4.4 4.4  CL 103 103 106  CO2 26  --  20*  GLUCOSE 117* 114* 83  BUN 13 14 15   CREATININE 1.20* 1.20* 1.19*  CALCIUM 8.9  --  8.4*   GFR: Estimated Creatinine Clearance: 33.8 mL/min (A) (by C-G formula based on SCr of 1.19 mg/dL (H)). Liver Function Tests: Recent Labs  Lab 03/02/18 1729  AST 20  ALT 12  ALKPHOS 83  BILITOT 0.9  PROT 7.6  ALBUMIN 3.6   No results for input(s): LIPASE, AMYLASE in the last 168 hours. No results for input(s): AMMONIA in the last 168 hours. Coagulation Profile: Recent Labs  Lab 03/02/18 1729  INR 1.73   Cardiac Enzymes: No results for input(s): CKTOTAL, CKMB, CKMBINDEX, TROPONINI in the last 168 hours. BNP (last 3 results) No results for input(s): PROBNP in the last 8760 hours. HbA1C: Recent Labs    03/03/18 1213  HGBA1C 5.5   CBG: Recent Labs  Lab 03/02/18 1725  GLUCAP 112*   Lipid Profile: Recent Labs    03/03/18 1213  CHOL 148  HDL 61  LDLCALC 65  TRIG 109  CHOLHDL 2.4   Thyroid Function Tests: No results for input(s): TSH, T4TOTAL, FREET4, T3FREE, THYROIDAB in the last 72 hours. Anemia Panel: No results for input(s): VITAMINB12, FOLATE, FERRITIN, TIBC, IRON,  RETICCTPCT in the last 72 hours. Urine analysis:    Component Value Date/Time   COLORURINE YELLOW 03/02/2018 1729   APPEARANCEUR HAZY (A) 03/02/2018 1729   LABSPEC 1.023 03/02/2018 1729   PHURINE 8.0 03/02/2018 1729   GLUCOSEU NEGATIVE 03/02/2018 1729   HGBUR NEGATIVE 03/02/2018 1729   BILIRUBINUR NEGATIVE 03/02/2018 1729   KETONESUR NEGATIVE 03/02/2018 1729   PROTEINUR NEGATIVE 03/02/2018 1729   UROBILINOGEN 0.2 07/28/2008 1156   NITRITE NEGATIVE 03/02/2018 1729   LEUKOCYTESUR NEGATIVE 03/02/2018 1729   Sepsis Labs: @LABRCNTIP (procalcitonin:4,lacticidven:4)  )No results found for this or any previous visit (from the past 240 hour(s)).    Radiology Studies: Ct Angio Head W Or Wo Contrast  Result Date: 03/02/2018 CLINICAL DATA:  Left-sided weakness EXAM: CT ANGIOGRAPHY HEAD AND NECK CT PERFUSION BRAIN TECHNIQUE: Multidetector CT imaging of the head and neck was performed using the standard protocol during bolus administration of intravenous contrast. Multiplanar  CT image reconstructions and MIPs were obtained to evaluate the vascular anatomy. Carotid stenosis measurements (when applicable) are obtained utilizing NASCET criteria, using the distal internal carotid diameter as the denominator. Multiphase CT imaging of the brain was performed following IV bolus contrast injection. Subsequent parametric perfusion maps were calculated using RAPID software. CONTRAST:  78mL ISOVUE-370 IOPAMIDOL (ISOVUE-370) INJECTION 76% COMPARISON:  None. FINDINGS: CTA NECK FINDINGS SKELETON: There is no bony spinal canal stenosis. No lytic or blastic lesion. OTHER NECK: Normal pharynx, larynx and major salivary glands. No cervical lymphadenopathy. Unremarkable thyroid gland. UPPER CHEST: No pneumothorax or pleural effusion. No nodules or masses. AORTIC ARCH: There is mild calcific atherosclerosis of the aortic arch. There is no aneurysm, dissection or hemodynamically significant stenosis of the visualized  ascending aorta and aortic arch. Conventional 3 vessel aortic branching pattern. The visualized proximal subclavian arteries are widely patent. RIGHT CAROTID SYSTEM: --Common carotid artery: Widely patent origin without common carotid artery dissection or aneurysm. --Internal carotid artery: No dissection, occlusion or aneurysm. There is mixed density atherosclerosis extending into the proximal ICA, resulting in less than 50% stenosis. --External carotid artery: No acute abnormality. LEFT CAROTID SYSTEM: --Common carotid artery: Widely patent origin without common carotid artery dissection or aneurysm. --Internal carotid artery: No dissection, occlusion or aneurysm. There is mixed density atherosclerosis extending into the proximal ICA, resulting in less than 50% stenosis. --External carotid artery: No acute abnormality. VERTEBRAL ARTERIES: Left dominant configuration. There is moderate narrowing of both vertebral artery origins. No dissection, occlusion or flow-limiting stenosis to the vertebrobasilar confluence. CTA HEAD FINDINGS ANTERIOR CIRCULATION: --Intracranial internal carotid arteries: Atherosclerotic calcification of the internal carotid arteries at the skull base without hemodynamically significant stenosis. --Anterior cerebral arteries: Normal. Both A1 segments are present. Patent anterior communicating artery. --Middle cerebral arteries: There is a short segment occlusion of the proximal right M2 segment. The length of the occluded segment is approximately 5 mm. --Posterior communicating arteries: Present bilaterally. POSTERIOR CIRCULATION: --Basilar artery: Normal. --Posterior cerebral arteries: Normal. --Superior cerebellar arteries: Normal. --Inferior cerebellar arteries: Normal anterior and posterior inferior cerebellar arteries. VENOUS SINUSES: As permitted by contrast timing, patent. ANATOMIC VARIANTS: None DELAYED PHASE: No parenchymal contrast enhancement. Review of the MIP images confirms the  above findings. ASPECTS ([place] Stroke Program Early CT Score) - Ganglionic level infarction (caudate, lentiform nuclei, internal capsule, insula, M1-M3 cortex): 5 - Supraganglionic infarction (M4-M6 cortex): 1 Total score (0-10 with 10 being normal): 6 CT Brain Perfusion Findings: CBF (<30%) Volume: 55mL Perfusion (Tmax>6.0s) volume: 57mL Mismatch Volume: 14mL Infarction Location:Posterior right MCA territory IMPRESSION: 1. Short segment occlusion of the right middle cerebral artery proximal M2 segment. 2. 21 mL region of ischemia in the posterior right MCA territory by CT perfusion criteria. The CT perfusion data does not demonstrate a core infarct, but the ASPECT Score is 6 with findings of early infarction in the posterior right MCA territory. 3. Bilateral carotid bifurcation atherosclerosis with less than 50% stenosis. 4.  Aortic atherosclerosis (ICD10-I70.0). These results were called by telephone at the time of interpretation on 03/02/2018 at 6:07 pm to Dr. Daleen Bo , who verbally acknowledged these results. Electronically Signed   By: Ulyses Jarred M.D.   On: 03/02/2018 18:25   Ct Angio Neck W And/or Wo Contrast  Result Date: 03/02/2018 CLINICAL DATA:  Left-sided weakness EXAM: CT ANGIOGRAPHY HEAD AND NECK CT PERFUSION BRAIN TECHNIQUE: Multidetector CT imaging of the head and neck was performed using the standard protocol during bolus administration of intravenous contrast. Multiplanar CT image  reconstructions and MIPs were obtained to evaluate the vascular anatomy. Carotid stenosis measurements (when applicable) are obtained utilizing NASCET criteria, using the distal internal carotid diameter as the denominator. Multiphase CT imaging of the brain was performed following IV bolus contrast injection. Subsequent parametric perfusion maps were calculated using RAPID software. CONTRAST:  65mL ISOVUE-370 IOPAMIDOL (ISOVUE-370) INJECTION 76% COMPARISON:  None. FINDINGS: CTA NECK FINDINGS SKELETON: There  is no bony spinal canal stenosis. No lytic or blastic lesion. OTHER NECK: Normal pharynx, larynx and major salivary glands. No cervical lymphadenopathy. Unremarkable thyroid gland. UPPER CHEST: No pneumothorax or pleural effusion. No nodules or masses. AORTIC ARCH: There is mild calcific atherosclerosis of the aortic arch. There is no aneurysm, dissection or hemodynamically significant stenosis of the visualized ascending aorta and aortic arch. Conventional 3 vessel aortic branching pattern. The visualized proximal subclavian arteries are widely patent. RIGHT CAROTID SYSTEM: --Common carotid artery: Widely patent origin without common carotid artery dissection or aneurysm. --Internal carotid artery: No dissection, occlusion or aneurysm. There is mixed density atherosclerosis extending into the proximal ICA, resulting in less than 50% stenosis. --External carotid artery: No acute abnormality. LEFT CAROTID SYSTEM: --Common carotid artery: Widely patent origin without common carotid artery dissection or aneurysm. --Internal carotid artery: No dissection, occlusion or aneurysm. There is mixed density atherosclerosis extending into the proximal ICA, resulting in less than 50% stenosis. --External carotid artery: No acute abnormality. VERTEBRAL ARTERIES: Left dominant configuration. There is moderate narrowing of both vertebral artery origins. No dissection, occlusion or flow-limiting stenosis to the vertebrobasilar confluence. CTA HEAD FINDINGS ANTERIOR CIRCULATION: --Intracranial internal carotid arteries: Atherosclerotic calcification of the internal carotid arteries at the skull base without hemodynamically significant stenosis. --Anterior cerebral arteries: Normal. Both A1 segments are present. Patent anterior communicating artery. --Middle cerebral arteries: There is a short segment occlusion of the proximal right M2 segment. The length of the occluded segment is approximately 5 mm. --Posterior communicating  arteries: Present bilaterally. POSTERIOR CIRCULATION: --Basilar artery: Normal. --Posterior cerebral arteries: Normal. --Superior cerebellar arteries: Normal. --Inferior cerebellar arteries: Normal anterior and posterior inferior cerebellar arteries. VENOUS SINUSES: As permitted by contrast timing, patent. ANATOMIC VARIANTS: None DELAYED PHASE: No parenchymal contrast enhancement. Review of the MIP images confirms the above findings. ASPECTS ([place] Stroke Program Early CT Score) - Ganglionic level infarction (caudate, lentiform nuclei, internal capsule, insula, M1-M3 cortex): 5 - Supraganglionic infarction (M4-M6 cortex): 1 Total score (0-10 with 10 being normal): 6 CT Brain Perfusion Findings: CBF (<30%) Volume: 48mL Perfusion (Tmax>6.0s) volume: 63mL Mismatch Volume: 19mL Infarction Location:Posterior right MCA territory IMPRESSION: 1. Short segment occlusion of the right middle cerebral artery proximal M2 segment. 2. 21 mL region of ischemia in the posterior right MCA territory by CT perfusion criteria. The CT perfusion data does not demonstrate a core infarct, but the ASPECT Score is 6 with findings of early infarction in the posterior right MCA territory. 3. Bilateral carotid bifurcation atherosclerosis with less than 50% stenosis. 4.  Aortic atherosclerosis (ICD10-I70.0). These results were called by telephone at the time of interpretation on 03/02/2018 at 6:07 pm to Dr. Daleen Bo , who verbally acknowledged these results. Electronically Signed   By: Ulyses Jarred M.D.   On: 03/02/2018 18:25   Mr Brain Wo Contrast  Result Date: 03/03/2018 CLINICAL DATA:  Stroke follow-up EXAM: MRI HEAD WITHOUT CONTRAST TECHNIQUE: Multiplanar, multiecho pulse sequences of the brain and surrounding structures were obtained without intravenous contrast. COMPARISON:  None. FINDINGS: BRAIN: Large area of acute ischemia within the right MCA territory, involving much  of the frontal and parietal cortex and the right insula.  No other diffusion abnormality. The midline structures are normal. No midline shift or other mass effect. There are old bilateral cerebellar infarcts and old infarcts of the right basal ganglia and thalamus. Early confluent hyperintense T2-weighted signal of the periventricular and deep white matter, most commonly due to chronic ischemic microangiopathy. There is cytotoxic edema throughout the distribution of the ischemia. The cerebral and cerebellar volume are age-appropriate. 2-3 scattered foci of chronic microhemorrhage. VASCULAR: Major intracranial arterial and venous sinus flow voids are normal. SKULL AND UPPER CERVICAL SPINE: Calvarial bone marrow signal is normal. There is no skull base mass. Visualized upper cervical spine and soft tissues are normal. SINUSES/ORBITS: No fluid levels or advanced mucosal thickening. No mastoid or middle ear effusion. The orbits are normal. IMPRESSION: 1. Large right MCA territory infarct without hemorrhage or mass effect. 2. Findings of chronic ischemic microangiopathy with multiple small vessel infarcts. Electronically Signed   By: Ulyses Jarred M.D.   On: 03/03/2018 22:23   Ct Cerebral Perfusion W Contrast  Result Date: 03/02/2018 CLINICAL DATA:  Left-sided weakness EXAM: CT ANGIOGRAPHY HEAD AND NECK CT PERFUSION BRAIN TECHNIQUE: Multidetector CT imaging of the head and neck was performed using the standard protocol during bolus administration of intravenous contrast. Multiplanar CT image reconstructions and MIPs were obtained to evaluate the vascular anatomy. Carotid stenosis measurements (when applicable) are obtained utilizing NASCET criteria, using the distal internal carotid diameter as the denominator. Multiphase CT imaging of the brain was performed following IV bolus contrast injection. Subsequent parametric perfusion maps were calculated using RAPID software. CONTRAST:  63mL ISOVUE-370 IOPAMIDOL (ISOVUE-370) INJECTION 76% COMPARISON:  None. FINDINGS: CTA NECK  FINDINGS SKELETON: There is no bony spinal canal stenosis. No lytic or blastic lesion. OTHER NECK: Normal pharynx, larynx and major salivary glands. No cervical lymphadenopathy. Unremarkable thyroid gland. UPPER CHEST: No pneumothorax or pleural effusion. No nodules or masses. AORTIC ARCH: There is mild calcific atherosclerosis of the aortic arch. There is no aneurysm, dissection or hemodynamically significant stenosis of the visualized ascending aorta and aortic arch. Conventional 3 vessel aortic branching pattern. The visualized proximal subclavian arteries are widely patent. RIGHT CAROTID SYSTEM: --Common carotid artery: Widely patent origin without common carotid artery dissection or aneurysm. --Internal carotid artery: No dissection, occlusion or aneurysm. There is mixed density atherosclerosis extending into the proximal ICA, resulting in less than 50% stenosis. --External carotid artery: No acute abnormality. LEFT CAROTID SYSTEM: --Common carotid artery: Widely patent origin without common carotid artery dissection or aneurysm. --Internal carotid artery: No dissection, occlusion or aneurysm. There is mixed density atherosclerosis extending into the proximal ICA, resulting in less than 50% stenosis. --External carotid artery: No acute abnormality. VERTEBRAL ARTERIES: Left dominant configuration. There is moderate narrowing of both vertebral artery origins. No dissection, occlusion or flow-limiting stenosis to the vertebrobasilar confluence. CTA HEAD FINDINGS ANTERIOR CIRCULATION: --Intracranial internal carotid arteries: Atherosclerotic calcification of the internal carotid arteries at the skull base without hemodynamically significant stenosis. --Anterior cerebral arteries: Normal. Both A1 segments are present. Patent anterior communicating artery. --Middle cerebral arteries: There is a short segment occlusion of the proximal right M2 segment. The length of the occluded segment is approximately 5 mm.  --Posterior communicating arteries: Present bilaterally. POSTERIOR CIRCULATION: --Basilar artery: Normal. --Posterior cerebral arteries: Normal. --Superior cerebellar arteries: Normal. --Inferior cerebellar arteries: Normal anterior and posterior inferior cerebellar arteries. VENOUS SINUSES: As permitted by contrast timing, patent. ANATOMIC VARIANTS: None DELAYED PHASE: No parenchymal contrast enhancement. Review of  the MIP images confirms the above findings. ASPECTS ([place] Stroke Program Early CT Score) - Ganglionic level infarction (caudate, lentiform nuclei, internal capsule, insula, M1-M3 cortex): 5 - Supraganglionic infarction (M4-M6 cortex): 1 Total score (0-10 with 10 being normal): 6 CT Brain Perfusion Findings: CBF (<30%) Volume: 68mL Perfusion (Tmax>6.0s) volume: 12mL Mismatch Volume: 72mL Infarction Location:Posterior right MCA territory IMPRESSION: 1. Short segment occlusion of the right middle cerebral artery proximal M2 segment. 2. 21 mL region of ischemia in the posterior right MCA territory by CT perfusion criteria. The CT perfusion data does not demonstrate a core infarct, but the ASPECT Score is 6 with findings of early infarction in the posterior right MCA territory. 3. Bilateral carotid bifurcation atherosclerosis with less than 50% stenosis. 4.  Aortic atherosclerosis (ICD10-I70.0). These results were called by telephone at the time of interpretation on 03/02/2018 at 6:07 pm to Dr. Daleen Bo , who verbally acknowledged these results. Electronically Signed   By: Ulyses Jarred M.D.   On: 03/02/2018 18:25   Ct Head Code Stroke Wo Contrast  Result Date: 03/02/2018 CLINICAL DATA:  Code stroke.  Left-sided weakness EXAM: CT HEAD WITHOUT CONTRAST TECHNIQUE: Contiguous axial images were obtained from the base of the skull through the vertex without intravenous contrast. COMPARISON:  None. FINDINGS: Brain: There is no mass, hemorrhage or extra-axial collection. The size and configuration of the  ventricles and extra-axial CSF spaces are normal. There is hypoattenuation within the posterior right MCA territory suggesting acute or early subacute infarct. No midline shift or other mass effect. 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 Vascular: Mild hyperattenuation within the right MCA M2 segment. Bilateral distal ICA atherosclerotic calcification. Skull: The visualized skull base, calvarium and extracranial soft tissues are normal. Sinuses/Orbits: No fluid levels or advanced mucosal thickening of the visualized paranasal sinuses. No mastoid or middle ear effusion. The orbits are normal. ASPECTS [facility] Stroke Program Early CT Score) - Ganglionic level infarction (caudate, lentiform nuclei, internal capsule, insula, M1-M3 cortex): 5 - Supraganglionic infarction (M4-M6 cortex): 1 Total score (0-10 with 10 being normal): 6 IMPRESSION: 1. No acute hemorrhage. 2. Hypoattenuation in the posterior right MCA territory consistent with acute or early subacute infarct. 3. ASPECTS is 6. These results were called by telephone at the time of interpretation on 03/02/2018 at 5:47 pm to Dr. Daleen Bo , who verbally acknowledged these results. Electronically Signed   By: Ulyses Jarred M.D.   On: 03/02/2018 17:53     Scheduled Meds: . aspirin  300 mg Rectal Daily   Or  . aspirin  325 mg Oral Daily  . atorvastatin  80 mg Oral QHS  . heparin  5,000 Units Subcutaneous Q8H  . pantoprazole  40 mg Oral q morning - 10a   Continuous Infusions: . sodium chloride 75 mL/hr at 03/03/18 1400     LOS: 2 days   Time Spent in minutes   30 minutes  Kyasia Steuck D.O. on 03/04/2018 at 1:17 PM  Between 7am to 7pm - Please see pager noted on [email or web address]  After 7pm go to [email or web address]  And look for the night coverage person covering for me after hours  [facility] Office  [phone]

## 2018-03-04 NOTE — Progress Notes (Signed)
STROKE TEAM PROGRESS NOTE   INTERVAL HISTORY Her daughter and grandson is at the bedside.   She continues to have dysarthria and dysphagia with left hemiplegia. She has not yet passed swallow eval. MRI scan of the brain shows a fairly large right MCA infarct without hemorrhagic transformation  Vitals:   03/03/18 2054 03/04/18 0125 03/04/18 0400 03/04/18 0710  BP: (!) 153/94 (!) 152/78 (!) 141/114 (!) 182/158  Pulse:  83 97 92  Resp:  (!) 21 (!) 21 19  Temp: 98.5 F (36.9 C) 98.6 F (37 C) 98.3 F (36.8 C) 98.9 F (37.2 C)  TempSrc: Oral Axillary Axillary Axillary  SpO2:  97% 98% 96%  Weight:      Height:        CBC:  Recent Labs  Lab 03/02/18 1729 03/02/18 1730 03/04/18 0428  WBC 7.7  --  6.9  NEUTROABS 5.2  --   --   HGB 11.3* 12.9 11.3*  HCT 38.0 38.0 37.0  MCV 89.6  --  88.3  PLT 381  --  419    Basic Metabolic Panel:  Recent Labs  Lab 03/02/18 1729 03/02/18 1730 03/04/18 0428  NA 138 140 137  K 4.1 4.4 4.4  CL 103 103 106  CO2 26  --  20*  GLUCOSE 117* 114* 83  BUN 13 14 15   CREATININE 1.20* 1.20* 1.19*  CALCIUM 8.9  --  8.4*   Lipid Panel:     Component Value Date/Time   CHOL 148 03/03/2018 1213   CHOL 163 05/31/2017 0858   TRIG 109 03/03/2018 1213   HDL 61 03/03/2018 1213   HDL 75 05/31/2017 0858   CHOLHDL 2.4 03/03/2018 1213   VLDL 22 03/03/2018 1213   LDLCALC 65 03/03/2018 1213   LDLCALC 67 05/31/2017 0858   HgbA1c:  Lab Results  Component Value Date   HGBA1C 5.5 03/03/2018   Urine Drug Screen:     Component Value Date/Time   LABOPIA NONE DETECTED 03/02/2018 1816   COCAINSCRNUR NONE DETECTED 03/02/2018 1816   LABBENZ NONE DETECTED 03/02/2018 1816   AMPHETMU NONE DETECTED 03/02/2018 1816   THCU NONE DETECTED 03/02/2018 1816   LABBARB NONE DETECTED 03/02/2018 1816    Alcohol Level     Component Value Date/Time   ETH <10 03/02/2018 1729    IMAGING Ct Angio Head W Or Wo Contrast  Result Date: 03/02/2018 CLINICAL DATA:   Left-sided weakness EXAM: CT ANGIOGRAPHY HEAD AND NECK CT PERFUSION BRAIN TECHNIQUE: Multidetector CT imaging of the head and neck was performed using the standard protocol during bolus administration of intravenous contrast. Multiplanar CT image reconstructions and MIPs were obtained to evaluate the vascular anatomy. Carotid stenosis measurements (when applicable) are obtained utilizing NASCET criteria, using the distal internal carotid diameter as the denominator. Multiphase CT imaging of the brain was performed following IV bolus contrast injection. Subsequent parametric perfusion maps were calculated using RAPID software. CONTRAST:  33mL ISOVUE-370 IOPAMIDOL (ISOVUE-370) INJECTION 76% COMPARISON:  None. FINDINGS: CTA NECK FINDINGS SKELETON: There is no bony spinal canal stenosis. No lytic or blastic lesion. OTHER NECK: Normal pharynx, larynx and major salivary glands. No cervical lymphadenopathy. Unremarkable thyroid gland. UPPER CHEST: No pneumothorax or pleural effusion. No nodules or masses. AORTIC ARCH: There is mild calcific atherosclerosis of the aortic arch. There is no aneurysm, dissection or hemodynamically significant stenosis of the visualized ascending aorta and aortic arch. Conventional 3 vessel aortic branching pattern. The visualized proximal subclavian arteries are widely patent. RIGHT CAROTID SYSTEM: --  Common carotid artery: Widely patent origin without common carotid artery dissection or aneurysm. --Internal carotid artery: No dissection, occlusion or aneurysm. There is mixed density atherosclerosis extending into the proximal ICA, resulting in less than 50% stenosis. --External carotid artery: No acute abnormality. LEFT CAROTID SYSTEM: --Common carotid artery: Widely patent origin without common carotid artery dissection or aneurysm. --Internal carotid artery: No dissection, occlusion or aneurysm. There is mixed density atherosclerosis extending into the proximal ICA, resulting in less than  50% stenosis. --External carotid artery: No acute abnormality. VERTEBRAL ARTERIES: Left dominant configuration. There is moderate narrowing of both vertebral artery origins. No dissection, occlusion or flow-limiting stenosis to the vertebrobasilar confluence. CTA HEAD FINDINGS ANTERIOR CIRCULATION: --Intracranial internal carotid arteries: Atherosclerotic calcification of the internal carotid arteries at the skull base without hemodynamically significant stenosis. --Anterior cerebral arteries: Normal. Both A1 segments are present. Patent anterior communicating artery. --Middle cerebral arteries: There is a short segment occlusion of the proximal right M2 segment. The length of the occluded segment is approximately 5 mm. --Posterior communicating arteries: Present bilaterally. POSTERIOR CIRCULATION: --Basilar artery: Normal. --Posterior cerebral arteries: Normal. --Superior cerebellar arteries: Normal. --Inferior cerebellar arteries: Normal anterior and posterior inferior cerebellar arteries. VENOUS SINUSES: As permitted by contrast timing, patent. ANATOMIC VARIANTS: None DELAYED PHASE: No parenchymal contrast enhancement. Review of the MIP images confirms the above findings. ASPECTS ([place] Stroke Program Early CT Score) - Ganglionic level infarction (caudate, lentiform nuclei, internal capsule, insula, M1-M3 cortex): 5 - Supraganglionic infarction (M4-M6 cortex): 1 Total score (0-10 with 10 being normal): 6 CT Brain Perfusion Findings: CBF (<30%) Volume: 48mL Perfusion (Tmax>6.0s) volume: 31mL Mismatch Volume: 72mL Infarction Location:Posterior right MCA territory IMPRESSION: 1. Short segment occlusion of the right middle cerebral artery proximal M2 segment. 2. 21 mL region of ischemia in the posterior right MCA territory by CT perfusion criteria. The CT perfusion data does not demonstrate a core infarct, but the ASPECT Score is 6 with findings of early infarction in the posterior right MCA territory. 3. Bilateral  carotid bifurcation atherosclerosis with less than 50% stenosis. 4.  Aortic atherosclerosis (ICD10-I70.0). These results were called by telephone at the time of interpretation on 03/02/2018 at 6:07 pm to Dr. Daleen Bo , who verbally acknowledged these results. Electronically Signed   By: Ulyses Jarred M.D.   On: 03/02/2018 18:25   Ct Angio Neck W And/or Wo Contrast  Result Date: 03/02/2018 CLINICAL DATA:  Left-sided weakness EXAM: CT ANGIOGRAPHY HEAD AND NECK CT PERFUSION BRAIN TECHNIQUE: Multidetector CT imaging of the head and neck was performed using the standard protocol during bolus administration of intravenous contrast. Multiplanar CT image reconstructions and MIPs were obtained to evaluate the vascular anatomy. Carotid stenosis measurements (when applicable) are obtained utilizing NASCET criteria, using the distal internal carotid diameter as the denominator. Multiphase CT imaging of the brain was performed following IV bolus contrast injection. Subsequent parametric perfusion maps were calculated using RAPID software. CONTRAST:  82mL ISOVUE-370 IOPAMIDOL (ISOVUE-370) INJECTION 76% COMPARISON:  None. FINDINGS: CTA NECK FINDINGS SKELETON: There is no bony spinal canal stenosis. No lytic or blastic lesion. OTHER NECK: Normal pharynx, larynx and major salivary glands. No cervical lymphadenopathy. Unremarkable thyroid gland. UPPER CHEST: No pneumothorax or pleural effusion. No nodules or masses. AORTIC ARCH: There is mild calcific atherosclerosis of the aortic arch. There is no aneurysm, dissection or hemodynamically significant stenosis of the visualized ascending aorta and aortic arch. Conventional 3 vessel aortic branching pattern. The visualized proximal subclavian arteries are widely patent. RIGHT CAROTID SYSTEM: --Common carotid  artery: Widely patent origin without common carotid artery dissection or aneurysm. --Internal carotid artery: No dissection, occlusion or aneurysm. There is mixed density  atherosclerosis extending into the proximal ICA, resulting in less than 50% stenosis. --External carotid artery: No acute abnormality. LEFT CAROTID SYSTEM: --Common carotid artery: Widely patent origin without common carotid artery dissection or aneurysm. --Internal carotid artery: No dissection, occlusion or aneurysm. There is mixed density atherosclerosis extending into the proximal ICA, resulting in less than 50% stenosis. --External carotid artery: No acute abnormality. VERTEBRAL ARTERIES: Left dominant configuration. There is moderate narrowing of both vertebral artery origins. No dissection, occlusion or flow-limiting stenosis to the vertebrobasilar confluence. CTA HEAD FINDINGS ANTERIOR CIRCULATION: --Intracranial internal carotid arteries: Atherosclerotic calcification of the internal carotid arteries at the skull base without hemodynamically significant stenosis. --Anterior cerebral arteries: Normal. Both A1 segments are present. Patent anterior communicating artery. --Middle cerebral arteries: There is a short segment occlusion of the proximal right M2 segment. The length of the occluded segment is approximately 5 mm. --Posterior communicating arteries: Present bilaterally. POSTERIOR CIRCULATION: --Basilar artery: Normal. --Posterior cerebral arteries: Normal. --Superior cerebellar arteries: Normal. --Inferior cerebellar arteries: Normal anterior and posterior inferior cerebellar arteries. VENOUS SINUSES: As permitted by contrast timing, patent. ANATOMIC VARIANTS: None DELAYED PHASE: No parenchymal contrast enhancement. Review of the MIP images confirms the above findings. ASPECTS ([place] Stroke Program Early CT Score) - Ganglionic level infarction (caudate, lentiform nuclei, internal capsule, insula, M1-M3 cortex): 5 - Supraganglionic infarction (M4-M6 cortex): 1 Total score (0-10 with 10 being normal): 6 CT Brain Perfusion Findings: CBF (<30%) Volume: 63mL Perfusion (Tmax>6.0s) volume: 69mL Mismatch  Volume: 54mL Infarction Location:Posterior right MCA territory IMPRESSION: 1. Short segment occlusion of the right middle cerebral artery proximal M2 segment. 2. 21 mL region of ischemia in the posterior right MCA territory by CT perfusion criteria. The CT perfusion data does not demonstrate a core infarct, but the ASPECT Score is 6 with findings of early infarction in the posterior right MCA territory. 3. Bilateral carotid bifurcation atherosclerosis with less than 50% stenosis. 4.  Aortic atherosclerosis (ICD10-I70.0). These results were called by telephone at the time of interpretation on 03/02/2018 at 6:07 pm to Dr. Daleen Bo , who verbally acknowledged these results. Electronically Signed   By: Ulyses Jarred M.D.   On: 03/02/2018 18:25   Mr Brain Wo Contrast  Result Date: 03/03/2018 CLINICAL DATA:  Stroke follow-up EXAM: MRI HEAD WITHOUT CONTRAST TECHNIQUE: Multiplanar, multiecho pulse sequences of the brain and surrounding structures were obtained without intravenous contrast. COMPARISON:  None. FINDINGS: BRAIN: Large area of acute ischemia within the right MCA territory, involving much of the frontal and parietal cortex and the right insula. No other diffusion abnormality. The midline structures are normal. No midline shift or other mass effect. There are old bilateral cerebellar infarcts and old infarcts of the right basal ganglia and thalamus. Early confluent hyperintense T2-weighted signal of the periventricular and deep white matter, most commonly due to chronic ischemic microangiopathy. There is cytotoxic edema throughout the distribution of the ischemia. The cerebral and cerebellar volume are age-appropriate. 2-3 scattered foci of chronic microhemorrhage. VASCULAR: Major intracranial arterial and venous sinus flow voids are normal. SKULL AND UPPER CERVICAL SPINE: Calvarial bone marrow signal is normal. There is no skull base mass. Visualized upper cervical spine and soft tissues are normal.  SINUSES/ORBITS: No fluid levels or advanced mucosal thickening. No mastoid or middle ear effusion. The orbits are normal. IMPRESSION: 1. Large right MCA territory infarct without hemorrhage or mass effect.  2. Findings of chronic ischemic microangiopathy with multiple small vessel infarcts. Electronically Signed   By: Ulyses Jarred M.D.   On: 03/03/2018 22:23   Ct Cerebral Perfusion W Contrast  Result Date: 03/02/2018 CLINICAL DATA:  Left-sided weakness EXAM: CT ANGIOGRAPHY HEAD AND NECK CT PERFUSION BRAIN TECHNIQUE: Multidetector CT imaging of the head and neck was performed using the standard protocol during bolus administration of intravenous contrast. Multiplanar CT image reconstructions and MIPs were obtained to evaluate the vascular anatomy. Carotid stenosis measurements (when applicable) are obtained utilizing NASCET criteria, using the distal internal carotid diameter as the denominator. Multiphase CT imaging of the brain was performed following IV bolus contrast injection. Subsequent parametric perfusion maps were calculated using RAPID software. CONTRAST:  3mL ISOVUE-370 IOPAMIDOL (ISOVUE-370) INJECTION 76% COMPARISON:  None. FINDINGS: CTA NECK FINDINGS SKELETON: There is no bony spinal canal stenosis. No lytic or blastic lesion. OTHER NECK: Normal pharynx, larynx and major salivary glands. No cervical lymphadenopathy. Unremarkable thyroid gland. UPPER CHEST: No pneumothorax or pleural effusion. No nodules or masses. AORTIC ARCH: There is mild calcific atherosclerosis of the aortic arch. There is no aneurysm, dissection or hemodynamically significant stenosis of the visualized ascending aorta and aortic arch. Conventional 3 vessel aortic branching pattern. The visualized proximal subclavian arteries are widely patent. RIGHT CAROTID SYSTEM: --Common carotid artery: Widely patent origin without common carotid artery dissection or aneurysm. --Internal carotid artery: No dissection, occlusion or aneurysm.  There is mixed density atherosclerosis extending into the proximal ICA, resulting in less than 50% stenosis. --External carotid artery: No acute abnormality. LEFT CAROTID SYSTEM: --Common carotid artery: Widely patent origin without common carotid artery dissection or aneurysm. --Internal carotid artery: No dissection, occlusion or aneurysm. There is mixed density atherosclerosis extending into the proximal ICA, resulting in less than 50% stenosis. --External carotid artery: No acute abnormality. VERTEBRAL ARTERIES: Left dominant configuration. There is moderate narrowing of both vertebral artery origins. No dissection, occlusion or flow-limiting stenosis to the vertebrobasilar confluence. CTA HEAD FINDINGS ANTERIOR CIRCULATION: --Intracranial internal carotid arteries: Atherosclerotic calcification of the internal carotid arteries at the skull base without hemodynamically significant stenosis. --Anterior cerebral arteries: Normal. Both A1 segments are present. Patent anterior communicating artery. --Middle cerebral arteries: There is a short segment occlusion of the proximal right M2 segment. The length of the occluded segment is approximately 5 mm. --Posterior communicating arteries: Present bilaterally. POSTERIOR CIRCULATION: --Basilar artery: Normal. --Posterior cerebral arteries: Normal. --Superior cerebellar arteries: Normal. --Inferior cerebellar arteries: Normal anterior and posterior inferior cerebellar arteries. VENOUS SINUSES: As permitted by contrast timing, patent. ANATOMIC VARIANTS: None DELAYED PHASE: No parenchymal contrast enhancement. Review of the MIP images confirms the above findings. ASPECTS ([place] Stroke Program Early CT Score) - Ganglionic level infarction (caudate, lentiform nuclei, internal capsule, insula, M1-M3 cortex): 5 - Supraganglionic infarction (M4-M6 cortex): 1 Total score (0-10 with 10 being normal): 6 CT Brain Perfusion Findings: CBF (<30%) Volume: 62mL Perfusion (Tmax>6.0s)  volume: 3mL Mismatch Volume: 48mL Infarction Location:Posterior right MCA territory IMPRESSION: 1. Short segment occlusion of the right middle cerebral artery proximal M2 segment. 2. 21 mL region of ischemia in the posterior right MCA territory by CT perfusion criteria. The CT perfusion data does not demonstrate a core infarct, but the ASPECT Score is 6 with findings of early infarction in the posterior right MCA territory. 3. Bilateral carotid bifurcation atherosclerosis with less than 50% stenosis. 4.  Aortic atherosclerosis (ICD10-I70.0). These results were called by telephone at the time of interpretation on 03/02/2018 at 6:07 pm to Dr. Vira Agar  [id number] , who verbally acknowledged these results. Electronically Signed   By: Ulyses Jarred M.D.   On: 03/02/2018 18:25   Ct Head Code Stroke Wo Contrast  Result Date: 03/02/2018 CLINICAL DATA:  Code stroke.  Left-sided weakness EXAM: CT HEAD WITHOUT CONTRAST TECHNIQUE: Contiguous axial images were obtained from the base of the skull through the vertex without intravenous contrast. COMPARISON:  None. FINDINGS: Brain: There is no mass, hemorrhage or extra-axial collection. The size and configuration of the ventricles and extra-axial CSF spaces are normal. There is hypoattenuation within the posterior right MCA territory suggesting acute or early subacute infarct. No midline shift or other mass effect. 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 Vascular: Mild hyperattenuation within the right MCA M2 segment. Bilateral distal ICA atherosclerotic calcification. Skull: The visualized skull base, calvarium and extracranial soft tissues are normal. Sinuses/Orbits: No fluid levels or advanced mucosal thickening of the visualized paranasal sinuses. No mastoid or middle ear effusion. The orbits are normal. ASPECTS [facility] Stroke Program Early CT Score) - Ganglionic level infarction (caudate, lentiform nuclei, internal  capsule, insula, M1-M3 cortex): 5 - Supraganglionic infarction (M4-M6 cortex): 1 Total score (0-10 with 10 being normal): 6 IMPRESSION: 1. No acute hemorrhage. 2. Hypoattenuation in the posterior right MCA territory consistent with acute or early subacute infarct. 3. ASPECTS is 6. These results were called by telephone at the time of interpretation on 03/02/2018 at 5:47 pm to Dr. Daleen Bo , who verbally acknowledged these results. Electronically Signed   By: Ulyses Jarred M.D.   On: 03/02/2018 17:53    PHYSICAL EXAM  frail cachectic looking elderly Caucasian lady not in distress. . Afebrile. Head is nontraumatic. Neck is supple without bruit.    Cardiac exam no murmur or gallop. Lungs are clear to auscultation. Distal pulses are well felt. Neurological Exam :  Awake alert oriented to time and place.right eyes closed and opens only partially due to recent eye surgery Severe dysarthria but can be understood. Follows commands well. Patient refuses to open the right eye due to recent surgery and pain. Left eyes moves well. Tongue midline. Left lower facial weakness. Dense left hemiplegia with left upper extremity grade 1-2/5 strength in left lower extremity 0/5. Purposeful antigravity movements on the right side. Decreased left hemibody sensation. Mild left hemi-neglect. Deep tendon reflexes are depressed on the left. Left plantar upgoing right downgoing. Gait not tested.  ASSESSMENT/PLAN Brenda Hall is a 84 y.o. female with history of AF on Xarelto, HTN, CAD, HLD presenting to [facility] with left-sided weakness after awakening.   Stroke:  right MCA infarct embolic secondary to known atrial fibrillation    Code Stroke CT head No acute hmg. hypoattenuation poster R MCA.  ASPECTS 6    CTA head & neck, CT perfusion R MCA M2 short occlusion. Posterior R MCA ischemia, no core. Bilateral ICA < 50% stenosis. Aortic atherosclerosis. MRI Large right MCA territory infarct without hemorrhage or  mass  Effect.  2D Echo  pending   LDL 67  HgbA1c 5.5  Heparin 5000 units sq tid for VTE prophylaxis  aspirin 81 mg daily and Xarelto (rivaroxaban) daily prior to admission, now on aspirin 300 mg suppository daily.  Hold anticoagulation given n.p.o. status and large stroke.  Will monitor for resumption  Therapy recommendations:  SNF vs CIR (Her grandson lives w. Her)  Disposition: Pending  Overall, she appears much better than her imaging.  While stroke is large  and likely devastating, we will see how she does over the next few days.  If she is not able to swallow, will readdress with patient and family at that time.  Atrial Fibrillation  Home anticoagulation:  Xarelto (rivaroxaban) daily   She takes it daily prior to going to bed, taking only with a few sips of water.  Likely is not the most effective way to take Xarelto.  Recommend taking it with the largest meal of the day for best absorption and continued effectiveness throughout the 24-hour. . Consider change to Eliquis. Will discuss.  For now not an anticoagulation candidate secondary to large stroke and n.p.o. status.   . Continue aspirin for now    Dysphagia secondary to stroke  Failed swallow this morning. Diet Order            Diet NPO time specified  Diet effective now             Speech therapy will follow  Hypertension  Stable . Permissive hypertension (OK if < 220/120) but gradually normalize in 5-7 days . Long-term BP goal normotensive  Hyperlipidemia  Home meds:  lipitor 80, resumed in hospital  LDL 67, goal < 70  Continue statin at discharge  Other Stroke Risk Factors  Advanced age  ETOH use, advised to drink no more than 1 drink(s) a day  UDS and ETOH neg  Hx stroke/TIA  03/2008 left posterior frontal deep white matter infarct  Coronary artery disease w/ stent 2007, MI  Other Active Problems  CKD stage III  Hx PMR recently slowly tapered off prednisone and started on  abaloparatide. On hold for now  Hospital day # 2     . She presented with right MCA embolic infarct from atrial fibrillation despite taking Xarelto but was perhaps not taking it correctly as she was taking it at bedtime. She presented beyond time window for intervention due to aspects score being 6.recommends patient therapy for swallow eval of. Continue close monitoring for neurological worsening. Family wants full code. May consider switching Xarelto to eliquis when patient is able to swallow. Continue aspirin for now. Continue ongoing stroke workup. long discussion at the bedside with patient's son and  daughter and answered questions. Discussed with Dr. Ree Kida Greater than 50% time during this 25 minute visit was spent on counseling and coordination of care but embolic stroke and answering questions.  Antony Contras, MD Medical Director [facility] Pager: [phone] 03/04/2018 2:30 PM  To contact Stroke Continuity provider, please refer to [email or web address] After hours, contact General Neurology

## 2018-03-04 NOTE — Clinical Social Work Note (Signed)
Clinical Social Work Assessment  Patient Details  Name: Brenda Hall MRN: [id number] Date of Birth: [birth year]  Date of referral:  03/04/18               Reason for consult:  Facility Placement                Permission sought to share information with:  Facility Sport and exercise psychologist, Family Supports Permission granted to share information::  Yes, Verbal Permission Granted  Name::     Filomena Jungling, Dorothea Ogle  Agency::  SNF  Relationship::  Son, Daughter, Ship broker Information:     Housing/Transportation Living arrangements for the past 2 months:  Single Family Home Source of Information:  Adult Children, Medical Team Patient Interpreter Needed:  None Criminal Activity/Legal Involvement Pertinent to Current Situation/Hospitalization:  No - Comment as needed Significant Relationships:  Adult Children, Other Family Members Lives with:  Self, Other (Comment) Do you feel safe going back to the place where you live?  Yes Need for family participation in patient care:  Yes (Comment)  Care giving concerns:  Patient from home with grandson, but will benefit from rehab at discharge.    Social Worker assessment / plan:  CSW met with patient's daughter and grandson at bedside to discuss plan for discharge. CSW discussed preferences for [facility] with the family. Patient's family attempted to get the patient to [facility] a few months ago and it didn't work out, the patient had to go to another facility and the family was not happy with it. CSW sent referral and left voicemail for admissions. CSW to follow.  Employment status:  Retired Forensic scientist:  Medicare PT Recommendations:  [place] / Referral to community resources:  [place]  Patient/Family's Response to care:  Patient's family agreeable to SNF.  Patient/Family's Understanding of and Emotional Response to Diagnosis, Current Treatment, and Prognosis:  Patient's family  aware that the patient will need therapies at discharge, they are fully on board. Patient's family will not be able to provide the care that she needs at discharge. Patient's son, Shanon Brow, is main Media planner for the patient.   Emotional Assessment Appearance:  Appears stated age Attitude/Demeanor/Rapport:  Unable to Assess Affect (typically observed):  Unable to Assess Orientation:  Oriented to Self Alcohol / Substance use:  Not Applicable Psych involvement (Current and /or in the community):  No (Comment)  Discharge Needs  Concerns to be addressed:  Care Coordination Readmission within the last 30 days:  No Current discharge risk:  Physical Impairment, Dependent with Mobility Barriers to Discharge:  Continued Medical Work up, [place], [place] 03/04/2018, 3:59 PM

## 2018-03-04 NOTE — NC FL2 (Signed)
[place] LEVEL OF CARE SCREENING TOOL     IDENTIFICATION  Patient Name: Brenda Hall Birthdate: [birth year] Sex: female Admission Date (Current Location): 03/02/2018  [facility] and [place] Number:  Herbalist and Address:  [facility], [place] [address] [place], [place] [address]      Provider Number: [id number]  Attending Physician Name and Address:  Cristal Ford, DO  Relative Name and Phone Number:       Current Level of Care: Hospital Recommended Level of Care: [place] Prior Approval Number:    Date Approved/Denied:   PASRR Number: [id number]  Discharge Plan: SNF    Current Diagnoses: Patient Active Problem List   Diagnosis Date Noted  . Cerebrovascular accident (CVA) due to occlusion of right middle cerebral artery ([place]) 03/02/2018  . Atrial fibrillation with RVR ([place]) 10/24/2017  . Nausea 10/24/2017  . Nausea 10/24/2017  . Acute bilateral low back pain without sciatica   . E. coli UTI   . Community acquired pneumonia   . L1 vertebral fracture ([place]) 09/27/2017  . UTI (urinary tract infection) 09/27/2017  . CKD (chronic kidney disease), stage III ([place])   . History of TIA (transient ischemic attack)   . PAF (paroxysmal atrial fibrillation) ([place])   . Atrial fibrillation ([place]) 02/12/2017  . Hyperlipidemia 02/12/2017  . Pulmonary edema 02/12/2017  . Urgency of micturition 02/12/2017  . Osteopenia of multiple sites 07/23/2016  . Age-related osteoporosis without current pathological fracture 07/11/2016  . History of total knee replacement, bilateral 07/11/2016  . History of COPD 07/11/2016  . Spondylosis of lumbar region without myelopathy or radiculopathy 07/11/2016  . Primary osteoarthritis of both hands 07/11/2016  . Primary osteoarthritis of both feet 07/11/2016  . Vitamin D deficiency 07/11/2016  . History of vertebral fracture 07/11/2016  . History of IBS 07/11/2016  . Basal cell  carcinoma 07/11/2016  . History of CVA (cerebrovascular accident)/ has metal implant not MRI safe  07/11/2016  . Other and unspecified hyperlipidemia 11/19/2012  . Cerebrovascular accident (stroke) ([place]) 11/19/2012  . Difficulty in walking(719.7) 11/06/2012  . Occlusion and stenosis of carotid artery without mention of cerebral infarction 02/07/2012  . OLD MYOCARDIAL INFARCTION 11/07/2009  . CAD, NATIVE VESSEL 11/07/2009  . Essential hypertension 09/28/2008  . IRRITABLE BOWEL SYNDROME 09/28/2008  . DEGENERATIVE JOINT DISEASE, KNEE 09/28/2008  . Polymyalgia rheumatica ([place]) 09/28/2008  . Chest pain, unspecified 09/28/2008    Orientation RESPIRATION BLADDER Height & Weight     Self  Normal Incontinent Weight: 153 lb 7 oz (69.6 kg) Height:  5\' 5"  (165.1 cm)  BEHAVIORAL SYMPTOMS/MOOD NEUROLOGICAL BOWEL NUTRITION STATUS      Incontinent Diet(see DC summary)  AMBULATORY STATUS COMMUNICATION OF NEEDS Skin   Extensive Assist Verbally Skin abrasions                       Personal Care Assistance Level of Assistance  Bathing, Feeding, Dressing Bathing Assistance: Maximum assistance Feeding assistance: Maximum assistance Dressing Assistance: Maximum assistance     Functional Limitations Info  Sight, Hearing, Speech Sight Info: Adequate Hearing Info: Adequate Speech Info: Impaired(dysarthria)    SPECIAL CARE FACTORS FREQUENCY  PT (By licensed PT), Speech therapy, OT (By licensed OT)     PT Frequency: 5x/wk OT Frequency: 5x/wk     Speech Therapy Frequency: 5x/wk      Contractures Contractures Info: Not present    Additional Factors Info  Code Status, Allergies Code Status  Info: Full Allergies Info: Epinephrine, Clopidogrel Bisulfate, Oxycodone           Current Medications (03/04/2018):  This is the current hospital active medication list Current Facility-Administered Medications  Medication Dose Route Frequency Provider Last Rate Last Dose  . 0.9 %  sodium  chloride infusion   Intravenous Continuous Rai, Ripudeep K, MD 75 mL/hr at 03/03/18 1400    . acetaminophen (TYLENOL) tablet 650 mg  650 mg Oral Q4H PRN Lenore Cordia, MD       Or  . acetaminophen (TYLENOL) solution 650 mg  650 mg Per Tube Q4H PRN Lenore Cordia, MD       Or  . acetaminophen (TYLENOL) suppository 650 mg  650 mg Rectal Q4H PRN Lenore Cordia, MD   650 mg at 03/03/18 0011  . aspirin suppository 300 mg  300 mg Rectal Daily Zada Finders R, MD   300 mg at 03/03/18 1105   Or  . aspirin tablet 325 mg  325 mg Oral Daily Patel, Vishal R, MD      . atorvastatin (LIPITOR) tablet 80 mg  80 mg Oral QHS Zada Finders R, MD      . heparin injection 5,000 Units  5,000 Units Subcutaneous Q8H Lenore Cordia, MD   5,000 Units at 03/04/18 [phone]  . hydrALAZINE (APRESOLINE) injection 10 mg  10 mg Intravenous Q6H PRN Zada Finders R, MD      . pantoprazole (PROTONIX) EC tablet 40 mg  40 mg Oral q morning - 10a Patel, Vishal R, MD      . senna-docusate (Senokot-S) tablet 1 tablet  1 tablet Oral QHS PRN Lenore Cordia, MD         Discharge Medications: Please see discharge summary for a list of discharge medications.  Relevant Imaging Results:  Relevant Lab Results:   Additional Information SS#: [id number]  Geralynn Ochs, LCSW

## 2018-03-04 NOTE — Consult Note (Signed)
   [facility] CM Inpatient Consult   03/04/2018  Brenda Hall [birth year] [id number]  Patient screened for potential [facility] Care Management services with Medicare due to unplanned readmission risk score of 23%, high with multiple hospitalizations within the past 6 months.   Chart reviewed. Patient had right MCA. Patient requiring max assist for all mobility and two person assist for safety. Current recommendations are for [place] placement.  Per chart review, patient support is with grandson staying with her at night.   Will follow for progress and disposition plans.  Netta Cedars, MSN, RN [facility] Liaison Nurse [phone] Business phone Toll free office [phone]

## 2018-03-04 NOTE — Progress Notes (Signed)
  Echocardiogram 2D Echocardiogram has been performed.  Brenda Hall 03/04/2018, 4:32 PM

## 2018-03-04 NOTE — Progress Notes (Signed)
Physical Therapy Treatment Patient Details Name: Brenda Hall MRN: [id number] DOB: 1931/04/13 Today's Date: 03/04/2018    History of Present Illness Pt is a 84 y/o female with PMH significant for atrial fibrillation on Xarelto, CAD, history of CVA, hypertension, CKD stage III, and polymyalgia rheumatica admitted with L sided weakness, facial droop and slurred speech.  Hx of R eye surgery 4 days ago and R eye drooping since. CT reveals acute/early subacute infarct in the posterior R MCA territory. Admitted for further stroke workup.      PT Comments    Emphasis today on sitting balance and assessing the level of neglect/inattention.  All mobility required max assist, safe with 2 persons only.  Today pt exhibited much more pushing from R to L and was difficult to inhibit.  Transfers also made more difficult going to the right due to pushing behaviors   Follow Up Recommendations  SNF;Supervision/Assistance - 24 hour     Equipment Recommendations  None recommended by PT    Recommendations for Other Services       Precautions / Restrictions Precautions Precautions: Fall Restrictions Weight Bearing Restrictions: No    Mobility  Bed Mobility Overal bed mobility: Needs Assistance Bed Mobility: Rolling;Sidelying to Sit Rolling: Max assist;+2 for physical assistance Sidelying to sit: Max assist;+2 for physical assistance       General bed mobility comments: max assist +2 for rolling towards R side, and transition to sitting EOB, able to push with R UE when positioned and cued to assist with transition to sitting EOB  Transfers Overall transfer level: Needs assistance   Transfers: Stand Pivot Transfers;Sit to/from Stand Sit to Stand: Max assist;+2 physical assistance Stand pivot transfers: Max assist;+2 physical assistance       General transfer comment: cueing for hand placement, sequencing and safety  Ambulation/Gait             General Gait Details: not  able   Stairs             Wheelchair Mobility    Modified Rankin (Stroke Patients Only) Modified Rankin (Stroke Patients Only) Pre-Morbid Rankin Score: Slight disability Modified Rankin: Severe disability     Balance Overall balance assessment: Needs assistance Sitting-balance support: No upper extremity supported;Feet supported Sitting balance-Leahy Scale: Poor Sitting balance - Comments: 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  Sat EOB up to as much as 12-15 min assessing level of inattension to the left. Postural control: Left lateral lean                                  Cognition Arousal/Alertness: Awake/alert Behavior During Therapy: Flat affect Overall Cognitive Status: Impaired/Different from baseline Area of Impairment: Attention;Memory;Following commands;Safety/judgement;Awareness;Problem solving                   Current Attention Level: Sustained Memory: Decreased recall of precautions;Decreased short-term memory Following Commands: Follows one step commands inconsistently;Follows one step commands with increased time Safety/Judgement: Decreased awareness of safety;Decreased awareness of deficits Awareness: Emergent Problem Solving: Slow processing;Decreased initiation;Difficulty sequencing;Requires verbal cues;Requires tactile cues General Comments: easily distracted, L sided neglect, slow procressing        Exercises      General Comments General comments (skin integrity, edema, etc.): pt display a left neglect with maximal cuing needed to draw attension to the left.  Pt displaying approx. a 4 sec delay.  Pertinent Vitals/Pain Pain Assessment: Faces Faces Pain Scale: Hurts a little bit Pain Location: throat soreness, back/neck with mobility general legs Pain Descriptors / Indicators: Discomfort;Sore Pain Intervention(s): Monitored during session    Home Living                      Prior Function             PT Goals (current goals can now be found in the care plan section) Acute Rehab PT Goals Patient Stated Goal: to be able to have a cold drink PT Goal Formulation: With patient Time For Goal Achievement: 03/17/18 Potential to Achieve Goals: Good Progress towards PT goals: Progressing toward goals    Frequency    Min 3X/week      PT Plan Current plan remains appropriate    Co-evaluation PT/OT/SLP Co-Evaluation/Treatment: Yes Reason for Co-Treatment: Complexity of the patient's impairments (multi-system involvement) PT goals addressed during session: Mobility/safety with mobility OT goals addressed during session: ADL's and self-care;Strengthening/ROM      AM-PAC PT "6 Clicks" Daily Activity  Outcome Measure  Difficulty turning over in bed (including adjusting bedclothes, sheets and blankets)?: Unable Difficulty moving from lying on back to sitting on the side of the bed? : Unable Difficulty sitting down on and standing up from a chair with arms (e.g., wheelchair, bedside commode, etc,.)?: Unable Help needed moving to and from a bed to chair (including a wheelchair)?: A Lot Help needed walking in hospital room?: Total Help needed climbing 3-5 steps with a railing? : Total 6 Click Score: 7    End of Session   Activity Tolerance: Patient tolerated treatment well Patient left: in chair;with call bell/phone within reach;with chair alarm set;with family/visitor present Nurse Communication: Mobility status PT Visit Diagnosis: Other abnormalities of gait and mobility (R26.89);Muscle weakness (generalized) (M62.81);Hemiplegia and hemiparesis;Pain Hemiplegia - Right/Left: Left Hemiplegia - dominant/non-dominant: Non-dominant Hemiplegia - caused by: Cerebral infarction     Time: 1110-1146 PT Time Calculation (min) (ACUTE ONLY): 36 min  Charges:  $Therapeutic Activity: 8-22 mins                     03/04/2018  [name], PT Acute Rehabilitation  Services [phone]  (pager) [phone]  (office)   Tessie Fass Sadye Kiernan 03/04/2018, 4:50 PM

## 2018-03-04 NOTE — Progress Notes (Signed)
Occupational Therapy Treatment Patient Details Name: Brenda Hall MRN: [id number] DOB: [birth year] Today's Date: 03/04/2018    History of present illness Pt is a 84 y/o female with PMH significant for atrial fibrillation on Xarelto, CAD, history of CVA, hypertension, CKD stage III, and polymyalgia rheumatica admitted with L sided weakness, facial droop and slurred speech.  Hx of R eye surgery 4 days ago and R eye drooping since. CT reveals acute/early subacute infarct in the posterior R MCA territory. Admitted for further stroke workup.     OT comments  Patient supine in bed and agreeable to session with PT/OT. Patient continues to require max cueing for attention to task and opening eyes during session.  Requires max A +2 for bed mobility and squat pivot transfer to recliner.  Able to maintain sitting balance EOB with 2+ assist (max assist of 1, min assist of 1) due to pusher syndrome towards L side with R UE, fear of falling, and decreased attention to task.  Noted L neglect of body and environment, unable to visually scan past midline towards L side.  Constant cueing for sequencing, motor planning and initiation of tasks. Continues to perseverate on needing a cold drink. Updated dc plan to SNF. Will continue to follow.     Follow Up Recommendations  Supervision/Assistance - 24 hour;SNF    Equipment Recommendations  Other (comment)(TBD at next venue of care)    Recommendations for Other Services PT consult    Precautions / Restrictions Precautions Precautions: Fall Restrictions Weight Bearing Restrictions: No       Mobility Bed Mobility Overal bed mobility: Needs Assistance Bed Mobility: Rolling;Sidelying to Sit Rolling: Max assist;+2 for physical assistance Sidelying to sit: Max assist;+2 for physical assistance       General bed mobility comments: max assist +2 for rolling towards R side, and transition to sitting EOB, able to push with R UE when positioned and cued to  assist with transition to sitting EOB  Transfers Overall transfer level: Needs assistance   Transfers: Stand Pivot Transfers;Sit to/from Stand Sit to Stand: Max assist;+2 physical assistance Stand pivot transfers: Max assist;+2 physical assistance       General transfer comment: cueing for hand placement, sequencing and safety    Balance Overall balance assessment: Needs assistance Sitting-balance support: No upper extremity supported;Feet supported Sitting balance-Leahy Scale: Poor Sitting balance - Comments: 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, improved with R hand in lap  Postural control: Left lateral lean                                 ADL either performed or assessed with clinical judgement   ADL Overall ADL's : Needs assistance/impaired     Grooming: Moderate assistance;Sitting                   Toilet Transfer: Maximal assistance;+2 for physical assistance;+2 for safety/equipment Toilet Transfer Details (indicate cue type and reason): lateral scoot simulated to recliner towards R side         Functional mobility during ADLs: Maximal assistance;Total assistance;+2 for physical assistance General ADL Comments: pt requires constant redirection to activity, heavy pusher syndrome with R UE towards L side, L neglect      Vision   Additional Comments: difficult to assess, continues to maintain eyes closed but able to open with cueing; scanning with eyes and head movement only; L neglect with inability to scan with eyes only past  midline    Perception     Praxis      Cognition Arousal/Alertness: Awake/alert Behavior During Therapy: Flat affect Overall Cognitive Status: Impaired/Different from baseline Area of Impairment: Attention;Memory;Following commands;Safety/judgement;Awareness;Problem solving                   Current Attention Level: Sustained Memory: Decreased recall of precautions;Decreased short-term memory Following Commands:  Follows one step commands inconsistently;Follows one step commands with increased time Safety/Judgement: Decreased awareness of safety;Decreased awareness of deficits Awareness: Emergent Problem Solving: Slow processing;Decreased initiation;Difficulty sequencing;Requires verbal cues;Requires tactile cues General Comments: easily distracted, L sided neglect, slow procressing          Exercises     Shoulder Instructions       General Comments      Pertinent Vitals/ Pain       Pain Assessment: Faces Faces Pain Scale: Hurts even more Pain Location: throat soreness, back/neck with mobility general legs Pain Descriptors / Indicators: Discomfort;Sore Pain Intervention(s): Monitored during session;Repositioned  Home Living                                          Prior Functioning/Environment              Frequency  Min 3X/week        Progress Toward Goals  OT Goals(current goals can now be found in the care plan section)  Progress towards OT goals: Progressing toward goals  Acute Rehab OT Goals Patient Stated Goal: to be able to have a cold drink OT Goal Formulation: With patient Time For Goal Achievement: 03/17/18 Potential to Achieve Goals: Good  Plan Discharge plan remains appropriate;Frequency remains appropriate    Co-evaluation    PT/OT/SLP Co-Evaluation/Treatment: Yes Reason for Co-Treatment: Complexity of the patient's impairments (multi-system involvement);Necessary to address cognition/behavior during functional activity;For patient/therapist safety;To address functional/ADL transfers   OT goals addressed during session: ADL's and self-care;Strengthening/ROM      AM-PAC PT "6 Clicks" Daily Activity     Outcome Measure   Help from another person eating meals?: Total Help from another person taking care of personal grooming?: A Lot Help from another person toileting, which includes using toliet, bedpan, or urinal?: Total Help from  another person bathing (including washing, rinsing, drying)?: Total Help from another person to put on and taking off regular upper body clothing?: Total Help from another person to put on and taking off regular lower body clothing?: Total 6 Click Score: 7    End of Session    OT Visit Diagnosis: Other abnormalities of gait and mobility (R26.89);Muscle weakness (generalized) (M62.81);Other symptoms and signs involving cognitive function;Low vision, both eyes (H54.2);Hemiplegia and hemiparesis;Pain Hemiplegia - Right/Left: Left Hemiplegia - dominant/non-dominant: Non-Dominant Hemiplegia - caused by: Cerebral infarction Pain - part of body: (back, neck, throat)   Activity Tolerance Patient tolerated treatment well   Patient Left in chair;with call bell/phone within reach;with chair alarm set;with family/visitor present   Nurse Communication Mobility status        Time: 1110-1146 OT Time Calculation (min): 36 min  Charges: OT General Charges $OT Visit: 1 Visit OT Treatments $Self Care/Home Management : 8-22 mins  Delight Stare, [place] Pager [phone] Office [phone]    Delight Stare 03/04/2018, 1:48 PM

## 2018-03-04 NOTE — Progress Notes (Addendum)
SLP Cancellation Note  Patient Details Name: Brenda Hall MRN: [id number] DOB: July 24, 1930   Cancelled treatment:       Reason Eval/Treat Not Completed: Other (comment)(pt currently asleep and did not fall asleep until late in the am, will continue efforts)  Family present and state pt did not fall asleep until approx 2 am.  Durward Fortes will continue efforts.  Pt did not awaken to tactile stimulation - cold hands - nor verbal stimulation from granddaughter.     Macario Golds 03/04/2018, 7:50 AM Luanna Salk, MS [facility] SLP Acute Rehab Services Pager [phone] Office [phone]

## 2018-03-04 NOTE — Progress Notes (Signed)
  Speech Language Pathology Treatment: Dysphagia  Patient Details Name: Brenda Hall MRN: [id number] DOB: 1930/10/02 Today's Date: 03/04/2018 Time: 1500-1540 SLP Time Calculation (min) (ACUTE ONLY): 40 min  Assessment / Plan / Recommendation Clinical Impression  Pt finally alert for assessment, though quite impulsive and cognitively impaired. Pt able to participate in sips and bites of puree, but signs of aspiration immediately following warrant objective testing prior to any decisions about alternate methods of feeding. Will schedule MBS in am. Family not present for assessment.   HPI HPI: 84 yo female adm to [facility] with left sided weakness (dense hemiplegia), dysarthria and AMS.  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 PMH + for shingles, PMR, TIA, CAD, recent viral infection- s/p Zpac use per pt.  Swallow and speech evaluation ordered.  Pt did not pass the [place] swallow screen.        SLP Plan  Continue with current plan of care       Recommendations  Diet recommendations: NPO                Oral Care Recommendations: Oral care QID Plan: Continue with current plan of care       GO                Brenda Hall, Katherene Ponto 03/04/2018, 3:40 PM

## 2018-03-04 NOTE — Progress Notes (Signed)
SLP Cancellation Note  Patient Details Name: Brenda Hall MRN: [id number] DOB: 1930/12/14   Cancelled treatment:    Patient not waking up for therapy. See at next scheduled date.         Charlynne Cousins Reganne Messerschmidt, MA, CCC-SLP 03/04/2018 12:59 PM

## 2018-03-04 NOTE — Telephone Encounter (Signed)
Patient's daughter called to let Dr. Estanislado Pandy know that her mom had a "massive stroke" on her right side and was admitted to the hospital on Monday 03/02/18.

## 2018-03-05 ENCOUNTER — Inpatient Hospital Stay (HOSPITAL_COMMUNITY): Payer: Medicare Other

## 2018-03-05 LAB — BASIC METABOLIC PANEL
ANION GAP: 14 (ref 5–15)
BUN: 17 mg/dL (ref 8–23)
CHLORIDE: 107 mmol/L (ref 98–111)
CO2: 18 mmol/L — ABNORMAL LOW (ref 22–32)
Calcium: 8.5 mg/dL — ABNORMAL LOW (ref 8.9–10.3)
Creatinine, Ser: 0.99 mg/dL (ref 0.44–1.00)
GFR calc Af Amer: 59 mL/min — ABNORMAL LOW (ref >60)
GFR, EST NON AFRICAN AMERICAN: 51 mL/min — AB (ref >60)
GLUCOSE: 114 mg/dL — AB (ref 70–99)
POTASSIUM: 4.2 mmol/L (ref 3.5–5.1)
SODIUM: 139 mmol/L (ref 135–145)

## 2018-03-05 MED ORDER — PHENOL 1.4 % MT LIQD
1.0000 | OROMUCOSAL | Status: DC | PRN
  Administered 2018-03-05: 1 via OROMUCOSAL
  Filled 2018-03-05 (×2): qty 177

## 2018-03-05 MED ORDER — DIPHENHYDRAMINE HCL 25 MG PO CAPS
25.0000 mg | ORAL_CAPSULE | ORAL | Status: DC | PRN
  Administered 2018-03-06 – 2018-03-08 (×2): 25 mg via ORAL
  Filled 2018-03-05 (×2): qty 1

## 2018-03-05 MED ORDER — METOPROLOL TARTRATE 5 MG/5ML IV SOLN
2.5000 mg | Freq: Four times a day (QID) | INTRAVENOUS | Status: DC
  Administered 2018-03-05 – 2018-03-07 (×9): 2.5 mg via INTRAVENOUS
  Filled 2018-03-05 (×9): qty 5

## 2018-03-05 NOTE — Care Management Note (Signed)
Case Management Note  Patient Details  Name: Brenda Hall MRN: [id number] Date of Birth: 09-23-1930  Subjective/Objective:    Pt admitted to r/o CVA. She is from home with her grandson who is mainly available at night.                 Action/Plan: Plan is for SNF. CM following.  Expected Discharge Date:                  Expected Discharge Plan:  Skilled Nursing Facility  In-House Referral:  Clinical Social Work  Discharge planning Services     Post Acute Care Choice:    Choice offered to:     DME Arranged:    DME Agency:     HH Arranged:    [place] Agency:     Status of Service:  In process, will continue to follow  If discussed at Long Length of Stay Meetings, dates discussed:    Additional Comments:  Pollie Friar, RN 03/05/2018, 10:35 AM

## 2018-03-05 NOTE — Care Management (Signed)
WHO SPOKE WITH/ COMPANY/ PHONE NUMBER: Checked online  MEDICATION/ DOSE: Eliquis 5mg  BID  AUTH REQUIRED? IF SO, PHONE NUMBER: No  COVERED?: Yes  CO-PAY FOR 30 DAY SUPPLY AT RETAIL PHARMACY: $24  $0 if picked up at [place]: $28  ALTERNATIVES IF MEDICATION NOT COVERED:   ADDITIONAL NOTES:

## 2018-03-05 NOTE — Care Management Important Message (Signed)
Important Message  Patient Details  Name: Brenda Hall MRN: [id number] Date of Birth: May 06, 1930   Medicare Important Message Given:  Yes    Orbie Pyo 03/05/2018, 2:49 PM

## 2018-03-05 NOTE — Progress Notes (Addendum)
STROKE TEAM PROGRESS NOTE   INTERVAL HISTORY Her daughter   is at the bedside.   She continues to have dysarthria and dysphagia with left hemiplegia. She has   passed swallow eva   Vitals:   03/05/18 0049 03/05/18 0452 03/05/18 0847 03/05/18 1244  BP: (!) 195/88 (!) 168/69  (!) 166/85  Pulse: (!) 105 (!) 48  92  Resp: 16 20  20   Temp:  99 F (37.2 C) 98.3 F (36.8 C) 98.5 F (36.9 C)  TempSrc:  Oral Oral Oral  SpO2: 94% 94%  98%  Weight:      Height:        CBC:  Recent Labs  Lab 03/02/18 1729 03/02/18 1730 03/04/18 0428  WBC 7.7  --  6.9  NEUTROABS 5.2  --   --   HGB 11.3* 12.9 11.3*  HCT 38.0 38.0 37.0  MCV 89.6  --  88.3  PLT 381  --  378    Basic Metabolic Panel:  Recent Labs  Lab 03/04/18 0428 03/05/18 0407  NA 137 139  K 4.4 4.2  CL 106 107  CO2 20* 18*  GLUCOSE 83 114*  BUN 15 17  CREATININE 1.19* 0.99  CALCIUM 8.4* 8.5*   Lipid Panel:     Component Value Date/Time   CHOL 148 03/03/2018 1213   CHOL 163 05/31/2017 0858   TRIG 109 03/03/2018 1213   HDL 61 03/03/2018 1213   HDL 75 05/31/2017 0858   CHOLHDL 2.4 03/03/2018 1213   VLDL 22 03/03/2018 1213   LDLCALC 65 03/03/2018 1213   LDLCALC 67 05/31/2017 0858   HgbA1c:  Lab Results  Component Value Date   HGBA1C 5.5 03/03/2018   Urine Drug Screen:     Component Value Date/Time   LABOPIA NONE DETECTED 03/02/2018 1816   COCAINSCRNUR NONE DETECTED 03/02/2018 1816   LABBENZ NONE DETECTED 03/02/2018 1816   AMPHETMU NONE DETECTED 03/02/2018 1816   THCU NONE DETECTED 03/02/2018 1816   LABBARB NONE DETECTED 03/02/2018 1816    Alcohol Level     Component Value Date/Time   ETH <10 03/02/2018 1729    IMAGING Mr Brain Wo Contrast  Result Date: 03/03/2018 CLINICAL DATA:  Stroke follow-up EXAM: MRI HEAD WITHOUT CONTRAST TECHNIQUE: Multiplanar, multiecho pulse sequences of the brain and surrounding structures were obtained without intravenous contrast. COMPARISON:  None. FINDINGS: BRAIN: Large  area of acute ischemia within the right MCA territory, involving much of the frontal and parietal cortex and the right insula. No other diffusion abnormality. The midline structures are normal. No midline shift or other mass effect. There are old bilateral cerebellar infarcts and old infarcts of the right basal ganglia and thalamus. Early confluent hyperintense T2-weighted signal of the periventricular and deep white matter, most commonly due to chronic ischemic microangiopathy. There is cytotoxic edema throughout the distribution of the ischemia. The cerebral and cerebellar volume are age-appropriate. 2-3 scattered foci of chronic microhemorrhage. VASCULAR: Major intracranial arterial and venous sinus flow voids are normal. SKULL AND UPPER CERVICAL SPINE: Calvarial bone marrow signal is normal. There is no skull base mass. Visualized upper cervical spine and soft tissues are normal. SINUSES/ORBITS: No fluid levels or advanced mucosal thickening. No mastoid or middle ear effusion. The orbits are normal. IMPRESSION: 1. Large right MCA territory infarct without hemorrhage or mass effect. 2. Findings of chronic ischemic microangiopathy with multiple small vessel infarcts. Electronically Signed   By: Ulyses Jarred M.D.   On: 03/03/2018 22:23   Dg Swallowing  Func-speech Pathology  Result Date: 03/05/2018 Objective Swallowing Evaluation: Type of Study: MBS-Modified Barium Swallow Study  Patient Details Name: Pearlene H Mooney-Riggs MRN: [id number] Date of Birth: 07-02-1930 Today's Date: 03/05/2018 Time: SLP Start Time (ACUTE ONLY): 0945 -SLP Stop Time (ACUTE ONLY): 1010 SLP Time Calculation (min) (ACUTE ONLY): 25 min Past Medical History: Past Medical History: Diagnosis Date . Anemia  . CAD in native artery   a. NSTEMI 12/2005 s/p BMS to Cx, prior normal LVEF. Marland Kitchen Cancer ([place]) 1988 and  2009  BCC nose and  Melanoma back . Cancer of skin Jan. 2014  [facility] Forehead and Nose . Carotid stenosis   mild . Chronic back pain  . CKD  (chronic kidney disease), stage III ([place])  . Coronary atherosclerosis of unspecified type of vessel, native or graft  . DJD (degenerative joint disease) of knee  . History of TIA (transient ischemic attack)  . HTN (hypertension)  . IBS (irritable bowel syndrome)  . Myocardial infarction [facility])   5 yrs ago     dr wall  cardiac . Osteoporosis  . Other diseases of lung, not elsewhere classified   lung nodule . Polymyalgia rheumatica ([place])  . Shingles 01/2016-02/2016 . Stroke [facility])  Past Surgical History: Past Surgical History: Procedure Laterality Date . ABDOMINAL HYSTERECTOMY  1977  partial . BACK SURGERY  1982 . CARDIAC CATHETERIZATION   . COLONOSCOPY N/A 01/27/2013  Procedure: COLONOSCOPY;  Surgeon: Rogene Houston, MD;  Location: [facility];  Service: Endoscopy;  Laterality: N/A;  1030 . EYE SURGERY Bilateral 04/2017 . EYE SURGERY Right 09/23/2017 . IR KYPHO LUMBAR INC FX REDUCE BONE BX UNI/BIL CANNULATION INC/IMAGING  09/30/2017 . IR VERTEBROPLASTY CERV/THOR BX INC UNI/BIL INC/INJECT/IMAGING  09/05/2017 . JOINT REPLACEMENT  2001 and  2011  6 knee surgeries   bil replacement . KNEE ARTHROPLASTY   . KNEE ARTHROSCOPY   . LUMBAR LAMINECTOMY   . PR VEIN BYPASS GRAFT,AORTO-FEM-POP  2010 . removal of melanoma from back  2007 . SPINE SURGERY  2013  Broken back: fusion surgery . tonisllectomy  1944 . total knee anthroplasty   . VERTEBROPLASTY  07/11/2011  Procedure: VERTEBROPLASTY;  Surgeon: Faythe Ghee, MD;  Location: [place] NEURO ORS;  Service: Neurosurgery;  Laterality: N/A;  Vertebroplasty of Lumbar Two HPI: 84 yo female adm to [facility] with left sided weakness (dense hemiplegia), dysarthria and AMS.  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 PMH + for shingles, PMR, TIA, CAD, recent viral infection- s/p Zpac use per pt.  Swallow and speech evaluation ordered.  Pt did not pass the [facility] swallow screen.   Subjective: Im going to disown my children, they wont give me any water Assessment / Plan / Recommendation CHL IP CLINICAL IMPRESSIONS  03/05/2018 Clinical Impression  Pt demonstrates a moderate oral dysphagia complicated by pt impulsivity, poor awarness and distracitbility. When total assist was given pt was able to seal right side of lips on spoon or straw. Anterior spill more significant with cup; pt often began talking with her mouth full of liquid as well. It verbal cues and extra time pt was able to lingually propel 90% of bolus to pyriforms with moderate left oral residuals. Increased cueing with puree needed. There was a significant delay in swallow initiation with pooled liquid in the pyriforms for several seconds. With thin, this resulted in sensed aspiration before the swallow. Only very trace sensed penetration of nectar thick liquids observed after the swallow of mild residue mixing with secretions. Depsite deficits, pt tolerated  nectar and puree quite well during study and appears to be able to sense, cough and eject trace penetrates that are likely to occur intermittently. Also of concern was signs of esophageal dysmotility on esophageal sweep (no radiologist present to confirm); esophageal precautions warranted. Will initiate Dys 1/nectar thick diet and monitor for tolerance and educate family and staff about precautions.  SLP Visit Diagnosis Dysphagia, oropharyngeal phase (R13.12) Attention and concentration deficit following -- Frontal lobe and executive function deficit following -- Impact on safety and function Moderate aspiration risk   CHL IP TREATMENT RECOMMENDATION 03/05/2018 Treatment Recommendations Therapy as outlined in treatment plan below   Prognosis 03/03/2018 Prognosis for Safe Diet Advancement Guarded Barriers to Reach Goals Severity of deficits;Behavior Barriers/Prognosis Comment -- CHL IP DIET RECOMMENDATION 03/05/2018 SLP Diet Recommendations Dysphagia 1 (Puree) solids;Nectar thick liquid Liquid Administration via [employer] Medication Administration Crushed with puree Compensations Slow rate;Small  sips/bites;Minimize environmental distractions;Monitor for anterior loss;Lingual sweep for clearance of pocketing;Clear throat intermittently Postural Changes Seated upright at 90 degrees;Remain semi-upright after after feeds/meals (Comment)   CHL IP OTHER RECOMMENDATIONS 03/05/2018 Recommended Consults -- Oral Care Recommendations Oral care before and after PO Other Recommendations Have oral suction available   CHL IP FOLLOW UP RECOMMENDATIONS 03/03/2018 Follow up Recommendations (No Data)   CHL IP FREQUENCY AND DURATION 03/05/2018 Speech Therapy Frequency (ACUTE ONLY) min 2x/week Treatment Duration 2 weeks      CHL IP ORAL PHASE 03/05/2018 Oral Phase Impaired Oral - Pudding Teaspoon -- Oral - Pudding Cup -- Oral - Honey Teaspoon -- Oral - Honey Cup -- Oral - Nectar Teaspoon Left anterior bolus loss;Weak lingual manipulation;Reduced posterior propulsion;Left pocketing in lateral sulci;Pocketing in anterior sulcus;Delayed oral transit;Decreased bolus cohesion;Premature spillage Oral - Nectar Cup -- Oral - Nectar Straw Left anterior bolus loss;Weak lingual manipulation;Reduced posterior propulsion;Left pocketing in lateral sulci;Pocketing in anterior sulcus;Delayed oral transit;Decreased bolus cohesion;Premature spillage Oral - Thin Teaspoon -- Oral - Thin Cup -- Oral - Thin Straw Left anterior bolus loss;Weak lingual manipulation;Reduced posterior propulsion;Left pocketing in lateral sulci;Pocketing in anterior sulcus;Delayed oral transit;Decreased bolus cohesion;Premature spillage Oral - Puree Left anterior bolus loss;Weak lingual manipulation;Reduced posterior propulsion;Left pocketing in lateral sulci;Pocketing in anterior sulcus;Delayed oral transit;Decreased bolus cohesion;Premature spillage Oral - Mech Soft -- Oral - Regular -- Oral - Multi-Consistency -- Oral - Pill -- Oral Phase - Comment --  CHL IP PHARYNGEAL PHASE 03/05/2018 Pharyngeal Phase Impaired Pharyngeal- Pudding Teaspoon -- Pharyngeal --  Pharyngeal- Pudding Cup -- Pharyngeal -- Pharyngeal- Honey Teaspoon -- Pharyngeal -- Pharyngeal- Honey Cup -- Pharyngeal -- Pharyngeal- Nectar Teaspoon Delayed swallow initiation-pyriform sinuses Pharyngeal -- Pharyngeal- Nectar Cup -- Pharyngeal -- Pharyngeal- Nectar Straw Delayed swallow initiation-pyriform sinuses;Penetration/Apiration after swallow;Pharyngeal residue - valleculae;Pharyngeal residue - pyriform Pharyngeal Material enters airway, CONTACTS cords and then ejected out;Material does not enter airway Pharyngeal- Thin Teaspoon -- Pharyngeal -- Pharyngeal- Thin Cup -- Pharyngeal -- Pharyngeal- Thin Straw Delayed swallow initiation-pyriform sinuses;Pharyngeal residue - valleculae;Pharyngeal residue - pyriform;Penetration/Aspiration before swallow;Trace aspiration Pharyngeal Material enters airway, passes BELOW cords then ejected out Pharyngeal- Puree Delayed swallow initiation-vallecula Pharyngeal -- Pharyngeal- Mechanical Soft -- Pharyngeal -- Pharyngeal- Regular -- Pharyngeal -- Pharyngeal- Multi-consistency -- Pharyngeal -- Pharyngeal- Pill -- Pharyngeal -- Pharyngeal Comment --  No flowsheet data found. Herbie Baltimore, MA CCC-SLP Acute Rehabilitation Services Pager [phone] Office [phone] Lynann Beaver 03/05/2018, 10:35 AM               PHYSICAL EXAM  frail cachectic looking elderly Caucasian lady not in distress. . Afebrile. Head is  nontraumatic. Neck is supple without bruit.    Cardiac exam no murmur or gallop. Lungs are clear to auscultation. Distal pulses are well felt. Neurological Exam :  Awake alert oriented to time and place.right eyes closed and opens only partially due to recent eye surgery Moderate dysarthria but can be understood. Follows commands well. Patient refuses to open the right eye due to recent surgery and pain. Left eyes moves well. Tongue midline. Left lower facial weakness. Dense left hemiplegia with left upper extremity grade 1-2/5 strength in left  lower extremity 0/5. Purposeful antigravity movements on the right side. Decreased left hemibody sensation. Mild left hemi-neglect. Deep tendon reflexes are depressed on the left. Left plantar upgoing right downgoing. Gait not tested.  ASSESSMENT/PLAN Ms. Nakeyia LAURINA FISCHL is a 84 y.o. female with history of AF on Xarelto, HTN, CAD, HLD presenting to [facility] with left-sided weakness after awakening.   Stroke:  right MCA infarct embolic secondary to known atrial fibrillation    Code Stroke CT head No acute hmg. hypoattenuation poster R MCA.  ASPECTS 6    CTA head & neck, CT perfusion R MCA M2 short occlusion. Posterior R MCA ischemia, no core. Bilateral ICA < 50% stenosis. Aortic atherosclerosis. MRI Large right MCA territory infarct without hemorrhage or mass  Effect. 2D Echo  Left ventricle: The cavity size was normal. Wall thickness was increased in a pattern of mild LVH. There was mild focal basal hypertrophy of the septum. Systolic function was vigorous. The estimated ejection fraction was in the range of 65% to 70%. Wall motion was normal; there were no regional wall motion abnormalities.  LDL 67  HgbA1c 5.5  Heparin 5000 units sq tid for VTE prophylaxis  aspirin 81 mg daily and Xarelto (rivaroxaban) daily prior to admission, now on aspirin 300 mg suppository daily.  Hold anticoagulation given n.p.o. status and large stroke.  Will monitor for resumption  Therapy recommendations:  SNF vs CIR (Her grandson lives w. Her)  Disposition: Pending  Overall, she appears much better than her imaging.  While stroke is large and likely devastating, we will see how she does over the next few days.  If she is not able to swallow, will readdress with patient and family at that time.  Atrial Fibrillation  Home anticoagulation:  Xarelto (rivaroxaban) daily   She takes it daily prior to going to bed, taking only with a few sips of water.  Likely is not the most effective way to take Xarelto.   Recommend taking it with the largest meal of the day for best absorption and continued effectiveness throughout the 24-hour. . Consider change to Eliquis. Will discuss.  For now not an anticoagulation candidate secondary to large stroke and n.p.o. status.   . Continue aspirin for now    Dysphagia secondary to stroke  Failed swallow this morning. Diet Order            DIET - DYS 1 Room service appropriate? Yes; Fluid consistency: Nectar Thick  Diet effective now             Speech therapy will follow  Hypertension  Stable . Permissive hypertension (OK if < 220/120) but gradually normalize in 5-7 days . Long-term BP goal normotensive  Hyperlipidemia  Home meds:  lipitor 80, resumed in hospital  LDL 67, goal < 70  Continue statin at discharge  Other Stroke Risk Factors  Advanced age  ETOH use, advised to drink no more than 1 drink(s) a day  UDS  and ETOH neg  Hx stroke/TIA  03/2008 left posterior frontal deep white matter infarct  Coronary artery disease w/ stent 2007, MI  Other Active Problems  CKD stage III  Hx PMR recently slowly tapered off prednisone and started on abaloparatide. On hold for now  Hospital day # 3     .  continue aspirin for 1 week due to large size of the stroke and then consider switching   to eliquis now that  patient is able to swallow. I had long discussion at the bedside with patient's son and  daughter and answered questions.anticipate transfer to rehabilitation for the next few days Discussed with Dr. Ree Kida Greater than 50% time during this 25 minute visit was spent on counseling and coordination of care but embolic stroke and answering questions.stroke team will sign off. Kindly call for questions.  Antony Contras, MD Medical Director [facility] Pager: [phone] 03/05/2018 2:57 PM  To contact Stroke Continuity provider, please refer to [email or web address] After hours, contact General Neurology

## 2018-03-05 NOTE — Progress Notes (Signed)
Modified Barium Swallow Progress Note  Patient Details  Name: Brenda Hall MRN: [id number] Date of Birth: 09/22/1930  Today's Date: 03/05/2018  Modified Barium Swallow completed.  Full report located under Chart Review in the Imaging Section.  Brief recommendations include the following:  Clinical Impression  Pt demonstrates a moderate oral dysphagia complicated by pt impulsivity, poor awarness and distracitbility. When total assist was given pt was able to seal right side of lips on spoon or straw. Anterior spill more significant with cup; pt often began talking with her mouth full of liquid as well. It verbal cues and extra time pt was able to lingually propel 90% of bolus to pyriforms with moderate left oral residuals. Increased cueing with puree needed. There was a significant delay in swallow initiation with pooled liquid in the pyriforms for several seconds. With thin, this resulted in sensed aspiration before the swallow. Only very trace sensed penetration of nectar thick liquids observed after the swallow of mild residue mixing with secretions. Depsite deficits, pt tolerated nectar and puree quite well during study and appears to be able to sense, cough and eject trace penetrates that are likely to occur intermittently. Also of concern was signs of esophageal dysmotility on esophageal sweep (no radiologist present to confirm); esophageal precautions warranted. Will initiate Dys 1/nectar thick diet and monitor for tolerance and educate family and staff about precautions.    Swallow Evaluation Recommendations       SLP Diet Recommendations: Dysphagia 1 (Puree) solids;Nectar thick liquid   Liquid Administration via: Straw;Spoon   Medication Administration: Crushed with puree   Supervision: Full assist for feeding;Full supervision/cueing for compensatory strategies   Compensations: Slow rate;Small sips/bites;Minimize environmental distractions;Monitor for anterior loss;Lingual  sweep for clearance of pocketing;Clear throat intermittently   Postural Changes: Seated upright at 90 degrees;Remain semi-upright after after feeds/meals (Comment)   Oral Care Recommendations: Oral care before and after PO   Other Recommendations: Have oral suction available  Brenda Baltimore, MA [place] Pager [phone] Office [phone]   Burnice Vassel, Katherene Ponto 03/05/2018,10:33 AM

## 2018-03-05 NOTE — Progress Notes (Signed)
PROGRESS NOTE    Brenda Hall  UXN:[id number] DOB: [birth year] DOA: 03/02/2018 PCP: [name], MD   Brief Narrative:  HPI On 03/02/2018 by Dr. Zada Finders Brenda Hall is a 84 y.o. female with medical history significant for atrial fibrillation on Xarelto, CAD, history of CVA, hypertension, CKD stage III, and polymyalgia rheumatica who presents to the ED with left-sided weakness, facial droop, and slurred speech.  Time last well-known was around 9 AM.  Patient is able to provide most of recent history.  Her son and daughter-in-law are present during the time of my evaluation and provide additional history.  Patient states she has recently been dealing with a upper respiratory infection which has not been improving with oral azithromycin.  She called her PCP today and was prescribed Augmentin.  Her grandson visited her at home and saw her sitting down in the chair.  Around 4 PM she attempted to get up but was having difficulty lifting her left leg off the floor.  She was also having slurred speech and she was noted to have drooping at the left side of her face.  She says she had eye surgery on her right eye 4 days ago with right eye drooping since that time.  She has been on Xarelto for atrial fibrillation and says she has been taking it every night and has not seen any obvious bleeding.  She also reports recently completing a slow taper off of oral prednisone for treatment of her polymyalgia rheumatica.  She denies any chest pain, palpitations, dyspnea, abdominal pain, or dysuria.  Interim history Admitted for acute CVA.  Neurology consulted and appreciated, work-up pending. Assessment & Plan   Acute CVA  -presented with left-sided weakness, facial droop and dysarthria -CT head showed changes consistent with acute or early subacute infarction and posterior right MCA territory, no hemorrhage -CTA head and neck cerebral perfusion study showed short segment occlusion of right  MCA proximal M2 -MRI large right MCA territory infarct without hemorrhage or mass-effect. -Echocardiogram EF 65 to 25%, grade 1 diastolic dysfunction.  PA peak pressure of 60 mmHg.  Sclerotic aortic valve, mild TR, moderate to severe pulmonary hypertension. -Hemoglobin A1c 5.5, LDL 65 -Neurology consulted and appreciated, recommended switching Xarelto to Eliquis when patient is able to swallow.  Continue aspirin for now. -Speech recommending MBS -PT recommended SNF -OT recommended CIR  Paroxysmal atrial fibrillation/atrial fibrillation with RVR -Toprol held permissive hypertension -Also held due to dysphasia and pending further recommendations from neurology -We will place patient on metoprolol 2.5 mg every 6 hours as patient had heart rates within the 150-170s.  Essential hypertension -Amlodipine and Toprol held for permissive hypertension  CAD -Continue aspirin rectally  CKD stage III -Creatinine appears to be at baseline, continue to monitor BMP  PMR -Patient follows with Dr. Estanislado Pandy -She has been tapered off of prednisone and started on abaloparatide- which is held for now  DVT Prophylaxis  Heparin  Code Status: Full  Family Communication: None at bedside  Disposition Plan: Admitted. Pending further work-up.  Suspect SNF at discharge.  Consultants Neurology  Procedures  None  Antibiotics   Anti-infectives (From admission, onward)   None      Subjective:   Brenda Hall seen and examined today.  Wants to eat and drink.  Very upset.  Has no other complaints, and went back to sleep.  Objective:   Vitals:   03/05/18 0042 03/05/18 0049 03/05/18 0452 03/05/18 0847  BP: (!) 161/102 (!) 195/88 Marland Kitchen)  168/69   Pulse: 100 (!) 105 (!) 48   Resp: (!) 21 16 20    Temp: 98.9 F (37.2 C)  99 F (37.2 C) 98.3 F (36.8 C)  TempSrc: Axillary  Oral Oral  SpO2: 98% 94% 94%   Weight:      Height:       No intake or output data in the 24 hours ending 03/05/18  1114 Filed Weights   03/02/18 1724  Weight: 69.6 kg   Exam  General: Well developed, well nourished, NAD, appears stated age  HEENT: NCAT, mucous membranes moist.   Neck: Supple  Cardiovascular: S1 S2 auscultated, irregular   Respiratory: Clear to auscultation bilaterally with equal chest rise  Abdomen: Soft, nontender, nondistended, + bowel sounds  Extremities: warm dry without cyanosis clubbing or edema  Neuro: Cannot fully assess at this time as patient is somewhat somnolent  Psych: Not assessed at this time    Data Reviewed: I have personally reviewed following labs and imaging studies  CBC: Recent Labs  Lab 03/02/18 1729 03/02/18 1730 03/04/18 0428  WBC 7.7  --  6.9  NEUTROABS 5.2  --   --   HGB 11.3* 12.9 11.3*  HCT 38.0 38.0 37.0  MCV 89.6  --  88.3  PLT 381  --  993   Basic Metabolic Panel: Recent Labs  Lab 03/02/18 1729 03/02/18 1730 03/04/18 0428 03/05/18 0407  NA 138 140 137 139  K 4.1 4.4 4.4 4.2  CL 103 103 106 107  CO2 26  --  20* 18*  GLUCOSE 117* 114* 83 114*  BUN 13 14 15 17   CREATININE 1.20* 1.20* 1.19* 0.99  CALCIUM 8.9  --  8.4* 8.5*   GFR: Estimated Creatinine Clearance: 40.7 mL/min (by C-G formula based on SCr of 0.99 mg/dL). Liver Function Tests: Recent Labs  Lab 03/02/18 1729  AST 20  ALT 12  ALKPHOS 83  BILITOT 0.9  PROT 7.6  ALBUMIN 3.6   No results for input(s): LIPASE, AMYLASE in the last 168 hours. No results for input(s): AMMONIA in the last 168 hours. Coagulation Profile: Recent Labs  Lab 03/02/18 1729  INR 1.73   Cardiac Enzymes: No results for input(s): CKTOTAL, CKMB, CKMBINDEX, TROPONINI in the last 168 hours. BNP (last 3 results) No results for input(s): PROBNP in the last 8760 hours. HbA1C: Recent Labs    03/03/18 1213  HGBA1C 5.5   CBG: Recent Labs  Lab 03/02/18 1725  GLUCAP 112*   Lipid Profile: Recent Labs    03/03/18 1213  CHOL 148  HDL 61  LDLCALC 65  TRIG 109  CHOLHDL 2.4    Thyroid Function Tests: No results for input(s): TSH, T4TOTAL, FREET4, T3FREE, THYROIDAB in the last 72 hours. Anemia Panel: No results for input(s): VITAMINB12, FOLATE, FERRITIN, TIBC, IRON, RETICCTPCT in the last 72 hours. Urine analysis:    Component Value Date/Time   COLORURINE YELLOW 03/02/2018 1729   APPEARANCEUR HAZY (A) 03/02/2018 1729   LABSPEC 1.023 03/02/2018 1729   PHURINE 8.0 03/02/2018 1729   GLUCOSEU NEGATIVE 03/02/2018 1729   HGBUR NEGATIVE 03/02/2018 1729   BILIRUBINUR NEGATIVE 03/02/2018 1729   KETONESUR NEGATIVE 03/02/2018 1729   PROTEINUR NEGATIVE 03/02/2018 1729   UROBILINOGEN 0.2 07/28/2008 1156   NITRITE NEGATIVE 03/02/2018 1729   LEUKOCYTESUR NEGATIVE 03/02/2018 1729   Sepsis Labs: @LABRCNTIP (procalcitonin:4,lacticidven:4)  )No results found for this or any previous visit (from the past 240 hour(s)).    Radiology Studies: Mr Brain Wo Contrast  Result  Date: 03/03/2018 CLINICAL DATA:  Stroke follow-up EXAM: MRI HEAD WITHOUT CONTRAST TECHNIQUE: Multiplanar, multiecho pulse sequences of the brain and surrounding structures were obtained without intravenous contrast. COMPARISON:  None. FINDINGS: BRAIN: Large area of acute ischemia within the right MCA territory, involving much of the frontal and parietal cortex and the right insula. No other diffusion abnormality. The midline structures are normal. No midline shift or other mass effect. There are old bilateral cerebellar infarcts and old infarcts of the right basal ganglia and thalamus. Early confluent hyperintense T2-weighted signal of the periventricular and deep white matter, most commonly due to chronic ischemic microangiopathy. There is cytotoxic edema throughout the distribution of the ischemia. The cerebral and cerebellar volume are age-appropriate. 2-3 scattered foci of chronic microhemorrhage. VASCULAR: Major intracranial arterial and venous sinus flow voids are normal. SKULL AND UPPER CERVICAL SPINE:  Calvarial bone marrow signal is normal. There is no skull base mass. Visualized upper cervical spine and soft tissues are normal. SINUSES/ORBITS: No fluid levels or advanced mucosal thickening. No mastoid or middle ear effusion. The orbits are normal. IMPRESSION: 1. Large right MCA territory infarct without hemorrhage or mass effect. 2. Findings of chronic ischemic microangiopathy with multiple small vessel infarcts. Electronically Signed   By: Ulyses Jarred M.D.   On: 03/03/2018 22:23   Dg Swallowing Func-speech Pathology  Result Date: 03/05/2018 Objective Swallowing Evaluation: Type of Study: MBS-Modified Barium Swallow Study  Patient Details Name: Laxmi H Hall MRN: [id number] Date of Birth: 1930/08/14 Today's Date: 03/05/2018 Time: SLP Start Time (ACUTE ONLY): 0945 -SLP Stop Time (ACUTE ONLY): 1010 SLP Time Calculation (min) (ACUTE ONLY): 25 min Past Medical History: Past Medical History: Diagnosis Date . Anemia  . CAD in native artery   a. NSTEMI 12/2005 s/p BMS to Cx, prior normal LVEF. Marland Kitchen Cancer ([place]) 1988 and  2009  BCC nose and  Melanoma back . Cancer of skin Jan. 2014  [facility] Forehead and Nose . Carotid stenosis   mild . Chronic back pain  . CKD (chronic kidney disease), stage III ([place])  . Coronary atherosclerosis of unspecified type of vessel, native or graft  . DJD (degenerative joint disease) of knee  . History of TIA (transient ischemic attack)  . HTN (hypertension)  . IBS (irritable bowel syndrome)  . Myocardial infarction [facility])   5 yrs ago     dr wall  cardiac . Osteoporosis  . Other diseases of lung, not elsewhere classified   lung nodule . Polymyalgia rheumatica ([place])  . Shingles 01/2016-02/2016 . Stroke [facility])  Past Surgical History: Past Surgical History: Procedure Laterality Date . ABDOMINAL HYSTERECTOMY  1977  partial . BACK SURGERY  1982 . CARDIAC CATHETERIZATION   . COLONOSCOPY N/A 01/27/2013  Procedure: COLONOSCOPY;  Surgeon: Rogene Houston, MD;  Location: [facility];  Service:  Endoscopy;  Laterality: N/A;  1030 . EYE SURGERY Bilateral 04/2017 . EYE SURGERY Right 09/23/2017 . IR KYPHO LUMBAR INC FX REDUCE BONE BX UNI/BIL CANNULATION INC/IMAGING  09/30/2017 . IR VERTEBROPLASTY CERV/THOR BX INC UNI/BIL INC/INJECT/IMAGING  09/05/2017 . JOINT REPLACEMENT  2001 and  2011  6 knee surgeries   bil replacement . KNEE ARTHROPLASTY   . KNEE ARTHROSCOPY   . LUMBAR LAMINECTOMY   . PR VEIN BYPASS GRAFT,AORTO-FEM-POP  2010 . removal of melanoma from back  2007 . SPINE SURGERY  2013  Broken back: fusion surgery . tonisllectomy  1944 . total knee anthroplasty   . VERTEBROPLASTY  07/11/2011  Procedure: VERTEBROPLASTY;  Surgeon: Faythe Ghee, MD;  Location: [facility] NEURO ORS;  Service: Neurosurgery;  Laterality: N/A;  Vertebroplasty of Lumbar Two HPI: 84 yo female adm to [facility] with left sided weakness (dense hemiplegia), dysarthria and AMS.  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 PMH + for shingles, PMR, TIA, CAD, recent viral infection- s/p Zpac use per pt.  Swallow and speech evaluation ordered.  Pt did not pass the [facility] swallow screen.   Subjective: Im going to disown my children, they wont give me any water Assessment / Plan / Recommendation CHL IP CLINICAL IMPRESSIONS 03/05/2018 Clinical Impression  Pt demonstrates a moderate oral dysphagia complicated by pt impulsivity, poor awarness and distracitbility. When total assist was given pt was able to seal right side of lips on spoon or straw. Anterior spill more significant with cup; pt often began talking with her mouth full of liquid as well. It verbal cues and extra time pt was able to lingually propel 90% of bolus to pyriforms with moderate left oral residuals. Increased cueing with puree needed. There was a significant delay in swallow initiation with pooled liquid in the pyriforms for several seconds. With thin, this resulted in sensed aspiration before the swallow. Only very trace sensed penetration of nectar thick liquids observed after the swallow of mild  residue mixing with secretions. Depsite deficits, pt tolerated nectar and puree quite well during study and appears to be able to sense, cough and eject trace penetrates that are likely to occur intermittently. Also of concern was signs of esophageal dysmotility on esophageal sweep (no radiologist present to confirm); esophageal precautions warranted. Will initiate Dys 1/nectar thick diet and monitor for tolerance and educate family and staff about precautions.  SLP Visit Diagnosis Dysphagia, oropharyngeal phase (R13.12) Attention and concentration deficit following -- Frontal lobe and executive function deficit following -- Impact on safety and function Moderate aspiration risk   CHL IP TREATMENT RECOMMENDATION 03/05/2018 Treatment Recommendations Therapy as outlined in treatment plan below   Prognosis 03/03/2018 Prognosis for Safe Diet Advancement Guarded Barriers to Reach Goals Severity of deficits;Behavior Barriers/Prognosis Comment -- CHL IP DIET RECOMMENDATION 03/05/2018 SLP Diet Recommendations Dysphagia 1 (Puree) solids;Nectar thick liquid Liquid Administration via [employer] Medication Administration Crushed with puree Compensations Slow rate;Small sips/bites;Minimize environmental distractions;Monitor for anterior loss;Lingual sweep for clearance of pocketing;Clear throat intermittently Postural Changes Seated upright at 90 degrees;Remain semi-upright after after feeds/meals (Comment)   CHL IP OTHER RECOMMENDATIONS 03/05/2018 Recommended Consults -- Oral Care Recommendations Oral care before and after PO Other Recommendations Have oral suction available   CHL IP FOLLOW UP RECOMMENDATIONS 03/03/2018 Follow up Recommendations (No Data)   CHL IP FREQUENCY AND DURATION 03/05/2018 Speech Therapy Frequency (ACUTE ONLY) min 2x/week Treatment Duration 2 weeks      CHL IP ORAL PHASE 03/05/2018 Oral Phase Impaired Oral - Pudding Teaspoon -- Oral - Pudding Cup -- Oral - Honey Teaspoon -- Oral - Honey Cup -- Oral -  Nectar Teaspoon Left anterior bolus loss;Weak lingual manipulation;Reduced posterior propulsion;Left pocketing in lateral sulci;Pocketing in anterior sulcus;Delayed oral transit;Decreased bolus cohesion;Premature spillage Oral - Nectar Cup -- Oral - Nectar Straw Left anterior bolus loss;Weak lingual manipulation;Reduced posterior propulsion;Left pocketing in lateral sulci;Pocketing in anterior sulcus;Delayed oral transit;Decreased bolus cohesion;Premature spillage Oral - Thin Teaspoon -- Oral - Thin Cup -- Oral - Thin Straw Left anterior bolus loss;Weak lingual manipulation;Reduced posterior propulsion;Left pocketing in lateral sulci;Pocketing in anterior sulcus;Delayed oral transit;Decreased bolus cohesion;Premature spillage Oral - Puree Left anterior bolus loss;Weak lingual manipulation;Reduced posterior propulsion;Left pocketing in lateral sulci;Pocketing in anterior sulcus;Delayed oral  transit;Decreased bolus cohesion;Premature spillage Oral - Mech Soft -- Oral - Regular -- Oral - Multi-Consistency -- Oral - Pill -- Oral Phase - Comment --  CHL IP PHARYNGEAL PHASE 03/05/2018 Pharyngeal Phase Impaired Pharyngeal- Pudding Teaspoon -- Pharyngeal -- Pharyngeal- Pudding Cup -- Pharyngeal -- Pharyngeal- Honey Teaspoon -- Pharyngeal -- Pharyngeal- Honey Cup -- Pharyngeal -- Pharyngeal- Nectar Teaspoon Delayed swallow initiation-pyriform sinuses Pharyngeal -- Pharyngeal- Nectar Cup -- Pharyngeal -- Pharyngeal- Nectar Straw Delayed swallow initiation-pyriform sinuses;Penetration/Apiration after swallow;Pharyngeal residue - valleculae;Pharyngeal residue - pyriform Pharyngeal Material enters airway, CONTACTS cords and then ejected out;Material does not enter airway Pharyngeal- Thin Teaspoon -- Pharyngeal -- Pharyngeal- Thin Cup -- Pharyngeal -- Pharyngeal- Thin Straw Delayed swallow initiation-pyriform sinuses;Pharyngeal residue - valleculae;Pharyngeal residue - pyriform;Penetration/Aspiration before swallow;Trace  aspiration Pharyngeal Material enters airway, passes BELOW cords then ejected out Pharyngeal- Puree Delayed swallow initiation-vallecula Pharyngeal -- Pharyngeal- Mechanical Soft -- Pharyngeal -- Pharyngeal- Regular -- Pharyngeal -- Pharyngeal- Multi-consistency -- Pharyngeal -- Pharyngeal- Pill -- Pharyngeal -- Pharyngeal Comment --  No flowsheet data found. Herbie Baltimore, MA CCC-SLP Acute Rehabilitation Services Pager [phone] Office [phone] Lynann Beaver 03/05/2018, 10:35 AM                Scheduled Meds: . aspirin  300 mg Rectal Daily   Or  . aspirin  325 mg Oral Daily  . atorvastatin  80 mg Oral QHS  . heparin  5,000 Units Subcutaneous Q8H  . metoprolol tartrate  2.5 mg Intravenous Q6H  . pantoprazole  40 mg Oral q morning - 10a   Continuous Infusions: . sodium chloride 75 mL/hr at 03/05/18 0612     LOS: 3 days   Time Spent in minutes   30 minutes  Trew Sunde D.O. on 03/05/2018 at 11:14 AM  Between 7am to 7pm - Please see pager noted on [email or web address]  After 7pm go to [email or web address]  And look for the night coverage person covering for me after hours  [facility] Office  [phone]

## 2018-03-06 ENCOUNTER — Ambulatory Visit: Payer: Medicare Other | Admitting: Rheumatology

## 2018-03-06 LAB — BASIC METABOLIC PANEL
Anion gap: 12 (ref 5–15)
BUN: 15 mg/dL (ref 8–23)
CALCIUM: 8.8 mg/dL — AB (ref 8.9–10.3)
CO2: 18 mmol/L — ABNORMAL LOW (ref 22–32)
Chloride: 110 mmol/L (ref 98–111)
Creatinine, Ser: 1 mg/dL (ref 0.44–1.00)
GFR calc Af Amer: 58 mL/min — ABNORMAL LOW (ref >60)
GFR, EST NON AFRICAN AMERICAN: 50 mL/min — AB (ref >60)
Glucose, Bld: 109 mg/dL — ABNORMAL HIGH (ref 70–99)
Potassium: 3.7 mmol/L (ref 3.5–5.1)
SODIUM: 140 mmol/L (ref 135–145)

## 2018-03-06 LAB — CBC
HCT: 37.7 % (ref 36.0–46.0)
Hemoglobin: 11.3 g/dL — ABNORMAL LOW (ref 12.0–15.0)
MCH: 26.3 pg (ref 26.0–34.0)
MCHC: 30 g/dL (ref 30.0–36.0)
MCV: 87.7 fL (ref 80.0–100.0)
Platelets: 354 10*3/uL (ref 150–400)
RBC: 4.3 MIL/uL (ref 3.87–5.11)
RDW: 15.6 % — AB (ref 11.5–15.5)
WBC: 9.3 10*3/uL (ref 4.0–10.5)
nRBC: 0 % (ref 0.0–0.2)

## 2018-03-06 MED ORDER — ASPIRIN 325 MG PO TABS: 325.0000 mg | ORAL_TABLET | Freq: Every day | ORAL | Status: DC

## 2018-03-06 MED ORDER — DILTIAZEM HCL 25 MG/5ML IV SOLN
10.0000 mg | Freq: Once | INTRAVENOUS | Status: AC
  Administered 2018-03-06: 10 mg via INTRAVENOUS
  Filled 2018-03-06: qty 5

## 2018-03-06 NOTE — Progress Notes (Signed)
  Speech Language Pathology Treatment: Dysphagia;Cognitive-Linquistic  Patient Details Name: Brenda Hall MRN: [id number] DOB: 09/27/1930 Today's Date: 03/06/2018 Time: 5701-7793 SLP Time Calculation (min) (ACUTE ONLY): 24 min  Assessment / Plan / Recommendation Clinical Impression  Pt seen with family at bedside. Provided demonstration of thickening and feeding, granddaughter return demonstrated verbal and tactile cueing. Pt is highly distractible. She needs constant max verbal cueing to sustain attention to PO, follow commands. Her behavior causes increased risk of aspiration. Over session only a few oz of water and pudding were consumed. Pt is not expected to consume enough for nutritional support over the long term. Discussed with pt and family.   HPI HPI: 84 yo female adm [facility] with left sided weakness (dense hemiplegia), dysarthria and AMS.  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 PMH + for shingles, PMR, TIA, CAD, recent viral infection- s/p Zpac use per pt.  Swallow and speech evaluation ordered.  Pt did not pass the [facility] swallow screen.        SLP Plan  Continue with current plan of care       Recommendations  Diet recommendations: Nectar-thick liquid;Dysphagia 1 (puree) Liquids provided via: Teaspoon;Straw Medication Administration: Via alternative means Supervision: Full supervision/cueing for compensatory strategies;Trained caregiver to feed patient;Staff to assist with self feeding Compensations: Slow rate;Small sips/bites;Minimize environmental distractions;Monitor for anterior loss;Lingual sweep for clearance of pocketing;Clear throat intermittently                Oral Care Recommendations: Oral care QID Follow up Recommendations: Skilled Nursing facility SLP Visit Diagnosis: Dysphagia, oropharyngeal phase (R13.12) Plan: Continue with current plan of care       GO               Herbie Baltimore, MA [place] Pager  [phone] Office [phone]  Lynann Beaver 03/06/2018, 12:09 PM

## 2018-03-06 NOTE — Clinical Social Work Placement (Signed)
   CLINICAL SOCIAL WORK PLACEMENT  NOTE  Date:  03/06/2018  Patient Details  Name: Brenda Hall MRN: [id number] Date of Birth: 26-Jun-1930  Clinical Social Work is seeking post-discharge placement for this patient at the   level of care (*CSW will initial, date and re-position this form in  chart as items are completed):      Patient/family provided with [place] Work Department's list of facilities offering this level of care within the geographic area requested by the patient (or if unable, by the patient's family).      Patient/family informed of their freedom to choose among providers that offer the needed level of care, that participate in Medicare, Medicaid or managed care program needed by the patient, have an available bed and are willing to accept the patient.      Patient/family informed of [facility] ownership interest in [facility] and [facility], as well as of the fact that they are under no obligation to receive care at these facilities.  PASRR submitted to EDS on       PASRR number received on       Existing PASRR number confirmed on       FL2 transmitted to all facilities in geographic area requested by pt/family on       FL2 transmitted to all facilities within larger geographic area on       Patient informed that his/her managed care company has contracts with or will negotiate with certain facilities, including the following:            Patient/family informed of bed offers received.  Patient chooses bed at [facility]     Physician recommends and patient chooses bed at      Patient to be transferred to [facility] on 03/06/18.  Patient to be transferred to facility by [facility]     Patient family notified on 03/06/18 of transfer.  Name of family member notified:  Fonnie Birkenhead     PHYSICIAN       Additional Comment:    _______________________________________________ Candie Chroman,  LCSW 03/06/2018, 3:13 PM

## 2018-03-06 NOTE — Progress Notes (Signed)
ANTICOAGULATION CONSULT NOTE - Initial Consult  Pharmacy Consult for Eliquis Indication: atrial fibrillation  Allergies  Allergen Reactions  . Epinephrine Other (See Comments)    During dental procedure-caused facial swelling at injection site. Pt has had epinephrine since then for nose and facial surgery and did fine with it per pt.  . Clopidogrel Bisulfate Itching and Rash    Plavix  . Oxycodone     Hallucinations. Pt states that she can tolerate hydrocodone    Patient Measurements: Height: 5\' 5"  (165.1 cm) Weight: 153 lb 7 oz (69.6 kg) IBW/kg (Calculated) : 57  Vital Signs: Temp: 98.2 F (36.8 C) (11/15 0850) Temp Source: Axillary (11/15 0850) BP: 162/63 (11/15 0850) Pulse Rate: 82 (11/15 0850)  Labs: Recent Labs    03/04/18 0428 03/05/18 0407 03/06/18 0423  HGB 11.3*  --  11.3*  HCT 37.0  --  37.7  PLT 345  --  354  CREATININE 1.19* 0.99 1.00    Estimated Creatinine Clearance: 40.3 mL/min (by C-G formula based on SCr of 1 mg/dL).   Medical History: Past Medical History:  Diagnosis Date  . Anemia   . CAD in native artery    a. NSTEMI 12/2005 s/p BMS to Cx, prior normal LVEF.  Marland Kitchen Cancer ([place]) 1988 and  2009   BCC nose and  Melanoma back  . Cancer of skin Jan. 2014   [facility] Forehead and Nose  . Carotid stenosis    mild  . Chronic back pain   . CKD (chronic kidney disease), stage III ([place])   . Coronary atherosclerosis of unspecified type of vessel, native or graft   . DJD (degenerative joint disease) of knee   . History of TIA (transient ischemic attack)   . HTN (hypertension)   . IBS (irritable bowel syndrome)   . Myocardial infarction [facility])    5 yrs ago     dr wall  cardiac  . Osteoporosis   . Other diseases of lung, not elsewhere classified    lung nodule  . Polymyalgia rheumatica ([place])   . Shingles 01/2016-02/2016  . Stroke [facility])     Medications:  Scheduled:  . aspirin  300 mg Rectal Daily   Or  . aspirin  325 mg Oral Daily  . atorvastatin   80 mg Oral QHS  . heparin  5,000 Units Subcutaneous Q8H  . metoprolol tartrate  2.5 mg Intravenous Q6H  . pantoprazole  40 mg Oral q morning - 10a    Assessment: 87 YOF with R-MCA infarct on Xarelto PTA for afib (not taking with food), to change AC to Eliquis for afib in one week, and continuing ASA now.  SCr 1, wt 69.6kg, age >69 qualifies for full dose.    Goal of Therapy:  Monitor platelets by anticoagulation protocol: Yes   Plan:  Start Eliquis 5mg  PO BID in one week 03/13/2018  Bertis Ruddy, PharmD Clinical Pharmacist Please check AMION for all [place] numbers 03/06/2018 12:10 PM

## 2018-03-06 NOTE — Clinical Social Work Note (Addendum)
Per RNCM, plan for discharge to SNF today. CSW left voicemail for [facility] Nursing admissions coordinator to check on bed availability for today.  Dayton Scrape, [place] [phone]  10:09 am [facility] will have a bed for patient today.  Dayton Scrape, CSW [phone]  11:37 am Per MD, son not at bedside so she left him a voicemail. CSW called and left a voicemail as well. SNF needs to know who will complete admissions paperwork.  Dayton Scrape, [place]  12:23 pm Tried calling son again. Did not leave second voicemail. Called daughter and left voicemail. SNF aware.  Dayton Scrape, [place] [phone]  2:46 pm Tried calling patient's children again. No answer. Did not leave voicemails.  Dayton Scrape, [place] [phone]  2:53 pm Spoke to patient's daughter. She works second shift while patient's son works third shift so she said he may be sleeping. Patient's daughter is off work all day tomorrow and could do paperwork at any time. CSW left voicemail for SNF admissions coordinator to see if patient can still admit today with plan for daughter to do paperwork tomorrow.  Dayton Scrape, [place]

## 2018-03-06 NOTE — Clinical Social Work Note (Signed)
Called patient's son again. He answered and expressed that he was very upset his sister authorized discharge for today. He stated she just started swallowing yesterday and that he had his rights and would keep her here two months if he wanted to. Received call from patient's sister right after and she stated he is very upset with her. MD, RN, and RNCM aware. [facility] has been cancelled. SNF admissions coordinator aware and said they can take patient over the weekend or next week if needed.  Dayton Scrape, [place]

## 2018-03-06 NOTE — Progress Notes (Signed)
Attempted to call report to [facility]. Left voicemail with call back number, will attempt again in 15 minutes.

## 2018-03-06 NOTE — Progress Notes (Signed)
Physical Therapy Treatment Patient Details Name: Brenda Hall MRN: [id number] DOB: [birth year] Today's Date: 03/06/2018    History of Present Illness Pt is a 84 y/o female with PMH significant for atrial fibrillation on Xarelto, CAD, history of CVA, hypertension, CKD stage III, and polymyalgia rheumatica admitted with L sided weakness, facial droop and slurred speech.  Hx of R eye surgery 4 days ago and R eye drooping since. CT reveals acute/early subacute infarct in the posterior R MCA territory. Admitted for further stroke workup.      PT Comments    Pt progressing slowly.  Focus continues to be poor, but she can be redirected more easily.   L neglect continues, but she is addressing the left and asking clarifying question more often, ie L or R?   Balance at EOB is a challenge, but a focus of treatment as well as sit to stand and transfers.    Follow Up Recommendations  SNF;Supervision/Assistance - 24 hour     Equipment Recommendations  None recommended by PT    Recommendations for Other Services       Precautions / Restrictions Precautions Precautions: Fall    Mobility  Bed Mobility Overal bed mobility: Needs Assistance Bed Mobility: Rolling;Sidelying to Sit Rolling: Max assist;+2 for physical assistance Sidelying to sit: Max assist;+2 for physical assistance       General bed mobility comments: pt needed hand over hand direction of UE's due to low focus on task.`  Transfers Overall transfer level: Needs assistance   Transfers: Sit to/from Stand;Stand Pivot Transfers Sit to Stand: Max assist;+2 physical assistance Stand pivot transfers: Max assist;+2 physical assistance       General transfer comment: cueing for hand placement, sequencing and safety  Ambulation/Gait                 Stairs             Wheelchair Mobility    Modified Rankin (Stroke Patients Only) Modified Rankin (Stroke Patients Only) Modified Rankin: Severe  disability     Balance Overall balance assessment: Needs assistance Sitting-balance support: No upper extremity supported;Single extremity supported Sitting balance-Leahy Scale: Poor Sitting balance - Comments: less pushing today, but still needed moderate to maximal assist for stability and upright posture. Postural control: Left lateral lean Standing balance support: During functional activity Standing balance-Leahy Scale: Poor Standing balance comment: face to face assist to stand with cues for hand placement that ended up generally in a "bear hug" for safety.                            Cognition Arousal/Alertness: Awake/alert Behavior During Therapy: Flat affect Overall Cognitive Status: Impaired/Different from baseline                     Current Attention Level: Sustained Memory: Decreased recall of precautions;Decreased short-term memory Following Commands: Follows one step commands inconsistently;Follows one step commands with increased time Safety/Judgement: Decreased awareness of safety;Decreased awareness of deficits Awareness: Emergent Problem Solving: Slow processing;Decreased initiation;Difficulty sequencing;Requires verbal cues;Requires tactile cues General Comments: easily distracted, L sided neglect, slow procressing        Exercises      General Comments        Pertinent Vitals/Pain Pain Assessment: Faces Faces Pain Scale: Hurts little more Pain Location: neck and L upper arm Pain Descriptors / Indicators: Discomfort;Sore Pain Intervention(s): Monitored during session    Home Living  Prior Function            PT Goals (current goals can now be found in the care plan section) Acute Rehab PT Goals Patient Stated Goal: to be able to have a cold drink PT Goal Formulation: With patient Time For Goal Achievement: 03/17/18 Potential to Achieve Goals: Good Progress towards PT goals: Progressing toward  goals    Frequency    Min 3X/week      PT Plan Current plan remains appropriate    Co-evaluation              AM-PAC PT "6 Clicks" Daily Activity  Outcome Measure  Difficulty turning over in bed (including adjusting bedclothes, sheets and blankets)?: Unable Difficulty moving from lying on back to sitting on the side of the bed? : Unable Difficulty sitting down on and standing up from a chair with arms (e.g., wheelchair, bedside commode, etc,.)?: Unable Help needed moving to and from a bed to chair (including a wheelchair)?: Total Help needed walking in hospital room?: Total Help needed climbing 3-5 steps with a railing? : Total 6 Click Score: 6    End of Session   Activity Tolerance: Patient tolerated treatment well Patient left: in chair;with call bell/phone within reach;with chair alarm set Nurse Communication: Mobility status PT Visit Diagnosis: Other abnormalities of gait and mobility (R26.89);Muscle weakness (generalized) (M62.81);Hemiplegia and hemiparesis;Pain Hemiplegia - Right/Left: Left Hemiplegia - dominant/non-dominant: Non-dominant Hemiplegia - caused by: Cerebral infarction Pain - Right/Left: Left Pain - part of body: (neck and left arm)     Time: 2505-3976 PT Time Calculation (min) (ACUTE ONLY): 36 min  Charges:  $Therapeutic Activity: 23-37 mins                     03/06/2018  [name], PT Acute Rehabilitation Services [phone]  (pager) [phone]  (office)   [name] 03/06/2018, 5:16 PM

## 2018-03-06 NOTE — Care Management (Signed)
Pts family upset about discharge and want patient to stay in the hospital for few more days. They want her to continue to have this level of care this close after a CVA. CM informed them that the MD feels she is ready to d/c and begin rehab at another facility. MD spoke to speech therapy and they also agree that patient will not benefit from continued stay at the hospital.  CM met with the family. They are going to appeal d/c. Paperwork provided and son calling [employer]. CM following.

## 2018-03-06 NOTE — Clinical Social Work Note (Signed)
CSW facilitated patient discharge including contacting patient family and facility to confirm patient discharge plans. Clinical information faxed to facility and family agreeable with plan. CSW arranged ambulance transport via [facility] to [facility]. RN to call report prior to discharge ([phone]).  CSW will sign off for now as social work intervention is no longer needed. Please consult us again if new needs arise.  Alveena Taira, CSW [phone]   

## 2018-03-06 NOTE — Discharge Summary (Addendum)
Physician Discharge Summary  Equilla H Mooney-Riggs OZD:[id number] DOB: Dec 01, 1930 DOA: 03/02/2018  PCP: [name], MD  Admit date: 03/02/2018 Discharge date: 03/06/2018  Time spent: 45 minutes  Recommendations for Outpatient Follow-up:  Patient will be discharged to skilled nursing facility, continue physical and occupational thearpy.  Patient will need to follow up with primary care provider within one week of discharge.  Follow up with neurology in 4-6 weeks. Patient should continue medications as prescribed.  Patient should follow a Dysphagia 1 diet. Continue taking aspirin for one week then start Eliquis 5mg  twice daily on 03/13/2018.  Discharge Diagnoses:  Acute CVA  Paroxysmal atrial fibrillation/atrial fibrillation with RVR Essential hypertension CAD CKD stage III PMR  Discharge Condition: Stable  Diet recommendation: dysphagia 1  Filed Weights   03/02/18 1724  Weight: 69.6 kg    History of present illness:  On 03/02/2018 by Dr. Zada Finders Allanna H Mooney-Riggsis a 84 y.o.femalewith medical history significant foratrial fibrillation on Xarelto, CAD, history of CVA, hypertension, CKD stage III, and polymyalgia rheumatica who presents to the ED with left-sided weakness, facial droop, and slurred speech. Time last well-known was around 9 AM. Patient is able to provide most of recent history. Her son and daughter-in-law are present during the time of my evaluation and provide additional history.  Patient states she has recently been dealing with a upper respiratory infection which has not been improving with oral azithromycin. She called her PCP today and was prescribed Augmentin. Her grandson visited her at home and saw her sitting down in the chair. Around 4 PM she attempted to get up but was having difficulty lifting her left leg off the floor. She was also having slurred speech and she was noted to have drooping at the left side of her face. She says she  had eye surgery on her right eye 4 days ago with right eye drooping since that time. She has been on Xarelto for atrial fibrillation and says she has been taking it every night and has not seen any obvious bleeding. She also reports recently completing a slow taper off of oral prednisone for treatment of her polymyalgia rheumatica. She denies any chest pain, palpitations, dyspnea, abdominal pain, or dysuria.  Hospital Course:  Acute CVA  -presented with left-sided weakness, facial droop and dysarthria -CT head showed changes consistent with acute or early subacute infarction and posterior right MCA territory, no hemorrhage -CTA head and neck cerebral perfusion study showed short segment occlusion of right MCA proximal M2 -MRI large right MCA territory infarct without hemorrhage or mass-effect. -Echocardiogram EF 65 to 25%, grade 1 diastolic dysfunction.  PA peak pressure of 60 mmHg.  Sclerotic aortic valve, mild TR, moderate to severe pulmonary hypertension. -Hemoglobin A1c 5.5, LDL 65 -Neurology consulted and appreciated, recommended switching Xarelto to Eliquis when patient is able to swallow.  Continue aspirin for now- one week then switch to Eliquis -Speech recommending MBS and was palced on dysphagia 1 diet    -PT recommended SNF -OT recommended CIR  Paroxysmal atrial fibrillation/atrial fibrillation with RVR -metoprolol restarted -Cardizem added for control of RVR  Essential hypertension -continue metoprolol and cardizem  CAD -Continue aspirin  CKD stage III -Creatinine appears to be at baseline  PMR -Patient follows with Dr. Estanislado Pandy -She has been tapered off of prednisone and started on abaloparatide- which is held for now  Consultants Neurology  Procedures  Echocardiogram  Discharge Exam: Vitals:   03/06/18 0850 03/06/18 1230  BP: (!) 162/63 (!) 153/62  Pulse: 82 68  Resp: (!) 22 20  Temp: 98.2 F (36.8 C) 98.3 F (36.8 C)  SpO2: 97% 97%      General: Well developed, well nourished, NAD, appears stated age  82: NCAT, mucous membranes moist.  Neck: Supple  Cardiovascular: S1 S2 auscultated, RRR  Respiratory: Clear to auscultation bilaterally with equal chest rise  Abdomen: Soft, nontender, nondistended, + bowel sounds  Extremities: warm dry without cyanosis clubbing. Left hand edema   Neuro: AAOx3, left sided weakness  Psych: Normal affect and demeanor with intact judgement and insight  Discharge Instructions Discharge Instructions    Discharge instructions   Complete by:  As directed    Patient will be discharged to skilled nursing facility, continue physical and occupational thearpy.  Patient will need to follow up with primary care provider within one week of discharge.  Follow up with neurology in 4-6 weeks. Patient should continue medications as prescribed.  Patient should follow a Dysphagia 1 diet. Continue taking aspirin for one week then start Eliquis.     Allergies as of 03/06/2018      Reactions   Epinephrine Other (See Comments)   During dental procedure-caused facial swelling at injection site. Pt has had epinephrine since then for nose and facial surgery and did fine with it per pt.   Clopidogrel Bisulfate Itching, Rash   Plavix   Oxycodone    Hallucinations. Pt states that she can tolerate hydrocodone      Medication List    STOP taking these medications   azithromycin 250 MG tablet Commonly known as:  ZITHROMAX   lidocaine 5 % Commonly known as:  LIDODERM   predniSONE 1 MG tablet Commonly known as:  DELTASONE   XARELTO 15 MG Tabs tablet Generic drug:  Rivaroxaban     TAKE these medications   Abaloparatide 3120 MCG/1.56ML Sopn Inject 80 mcg into the skin daily.   acetaminophen 500 MG tablet Commonly known as:  TYLENOL Take 500 mg by mouth every 6 (six) hours as needed for mild pain.   amLODipine 5 MG tablet Commonly known as:  NORVASC Take 5 mg by mouth daily.    amoxicillin-clavulanate 875-125 MG tablet Commonly known as:  AUGMENTIN Take 1 tablet by mouth 2 (two) times daily for 10 days.   aspirin 325 MG tablet Take 1 tablet (325 mg total) by mouth daily. Take through 03/12/2018 Start taking on:  03/07/2018 What changed:    medication strength  how much to take  additional instructions   atorvastatin 80 MG tablet Commonly known as:  LIPITOR Take 1 tablet (80 mg total) by mouth at bedtime.   CALCIUM 1200 PO Take 1 tablet by mouth at bedtime.   estrogens (conjugated) 0.3 MG tablet Commonly known as:  PREMARIN Take 1 tablet (0.3 mg total) by mouth every evening. What changed:  Another medication with the same name was changed. Make sure you understand how and when to take each.   PREMARIN 0.3 MG tablet Generic drug:  estrogens (conjugated) TAKE 1 TABLET DAILY FOR 21 DAYS, THEN DO NOT TAKE FOR 7 DAYS What changed:  See the new instructions.   fluticasone 50 MCG/ACT nasal spray Commonly known as:  FLONASE USE TWO SPRAYS IN EACH NOSTRIL ONCE DAILY What changed:    how much to take  how to take this  when to take this  reasons to take this   loperamide 2 MG capsule Commonly known as:  IMODIUM Take 2 mg by mouth daily as needed  for diarrhea or loose stools.   metoprolol succinate 50 MG 24 hr tablet Commonly known as:  TOPROL-XL Take 75 mg by mouth daily. Take with or immediately following a meal.   nitroGLYCERIN 0.4 MG SL tablet Commonly known as:  NITROSTAT Place 1 tablet (0.4 mg total) under the tongue every 5 (five) minutes as needed for chest pain.   pantoprazole 40 MG tablet Commonly known as:  PROTONIX TAKE 1 TABLET EVERY MORNING   Pen Needles 30G X 8 MM Misc 1 each by Does not apply route daily. Use one pen needle daily to inject Tymlos.   Zinc 50 MG Caps Take 50 mg by mouth daily.      Allergies  Allergen Reactions  . Epinephrine Other (See Comments)    During dental procedure-caused facial swelling  at injection site. Pt has had epinephrine since then for nose and facial surgery and did fine with it per pt.  . Clopidogrel Bisulfate Itching and Rash    Plavix  . Oxycodone     Hallucinations. Pt states that she can tolerate hydrocodone    Contact information for follow-up providers    [name], MD. Schedule an appointment as soon as possible for a visit in 1 week(s).   Specialty:  Family Medicine Why:  Hospital follow up Contact information: [place] [address] [phone]        Arnoldo Lenis, MD .   Specialty:  Cardiology Contact information: [address] [place] [address] [phone]        Garvin Fila, MD. Schedule an appointment as soon as possible for a visit in 6 week(s).   Specialties:  Neurology, Radiology Why:  Stroke clinic Contact information: [address] [place] [address] [phone]            Contact information for after-discharge care    [facility] .   Service:  Skilled Nursing Contact information: [address] [place] [phone]                   The results of significant diagnostics from this hospitalization (including imaging, microbiology, ancillary and laboratory) are listed below for reference.    Significant Diagnostic Studies: Ct Angio Head W Or Wo Contrast  Result Date: 03/02/2018 CLINICAL DATA:  Left-sided weakness EXAM: CT ANGIOGRAPHY HEAD AND NECK CT PERFUSION BRAIN TECHNIQUE: Multidetector CT imaging of the head and neck was performed using the standard protocol during bolus administration of intravenous contrast. Multiplanar CT image reconstructions and MIPs were obtained to evaluate the vascular anatomy. Carotid stenosis measurements (when applicable) are obtained utilizing NASCET criteria, using the distal internal carotid diameter as the denominator. Multiphase CT  imaging of the brain was performed following IV bolus contrast injection. Subsequent parametric perfusion maps were calculated using RAPID software. CONTRAST:  23mL ISOVUE-370 IOPAMIDOL (ISOVUE-370) INJECTION 76% COMPARISON:  None. FINDINGS: CTA NECK FINDINGS SKELETON: There is no bony spinal canal stenosis. No lytic or blastic lesion. OTHER NECK: Normal pharynx, larynx and major salivary glands. No cervical lymphadenopathy. Unremarkable thyroid gland. UPPER CHEST: No pneumothorax or pleural effusion. No nodules or masses. AORTIC ARCH: There is mild calcific atherosclerosis of the aortic arch. There is no aneurysm, dissection or hemodynamically significant stenosis of the visualized ascending aorta and aortic arch. Conventional 3 vessel aortic branching pattern. The visualized proximal subclavian arteries are widely patent. RIGHT CAROTID SYSTEM: --Common carotid artery:  Widely patent origin without common carotid artery dissection or aneurysm. --Internal carotid artery: No dissection, occlusion or aneurysm. There is mixed density atherosclerosis extending into the proximal ICA, resulting in less than 50% stenosis. --External carotid artery: No acute abnormality. LEFT CAROTID SYSTEM: --Common carotid artery: Widely patent origin without common carotid artery dissection or aneurysm. --Internal carotid artery: No dissection, occlusion or aneurysm. There is mixed density atherosclerosis extending into the proximal ICA, resulting in less than 50% stenosis. --External carotid artery: No acute abnormality. VERTEBRAL ARTERIES: Left dominant configuration. There is moderate narrowing of both vertebral artery origins. No dissection, occlusion or flow-limiting stenosis to the vertebrobasilar confluence. CTA HEAD FINDINGS ANTERIOR CIRCULATION: --Intracranial internal carotid arteries: Atherosclerotic calcification of the internal carotid arteries at the skull base without hemodynamically significant stenosis. --Anterior  cerebral arteries: Normal. Both A1 segments are present. Patent anterior communicating artery. --Middle cerebral arteries: There is a short segment occlusion of the proximal right M2 segment. The length of the occluded segment is approximately 5 mm. --Posterior communicating arteries: Present bilaterally. POSTERIOR CIRCULATION: --Basilar artery: Normal. --Posterior cerebral arteries: Normal. --Superior cerebellar arteries: Normal. --Inferior cerebellar arteries: Normal anterior and posterior inferior cerebellar arteries. VENOUS SINUSES: As permitted by contrast timing, patent. ANATOMIC VARIANTS: None DELAYED PHASE: No parenchymal contrast enhancement. Review of the MIP images confirms the above findings. ASPECTS ([place] Stroke Program Early CT Score) - Ganglionic level infarction (caudate, lentiform nuclei, internal capsule, insula, M1-M3 cortex): 5 - Supraganglionic infarction (M4-M6 cortex): 1 Total score (0-10 with 10 being normal): 6 CT Brain Perfusion Findings: CBF (<30%) Volume: 56mL Perfusion (Tmax>6.0s) volume: 51mL Mismatch Volume: 15mL Infarction Location:Posterior right MCA territory IMPRESSION: 1. Short segment occlusion of the right middle cerebral artery proximal M2 segment. 2. 21 mL region of ischemia in the posterior right MCA territory by CT perfusion criteria. The CT perfusion data does not demonstrate a core infarct, but the ASPECT Score is 6 with findings of early infarction in the posterior right MCA territory. 3. Bilateral carotid bifurcation atherosclerosis with less than 50% stenosis. 4.  Aortic atherosclerosis (ICD10-I70.0). These results were called by telephone at the time of interpretation on 03/02/2018 at 6:07 pm to Dr. Daleen Bo , who verbally acknowledged these results. Electronically Signed   By: Ulyses Jarred M.D.   On: 03/02/2018 18:25   Ct Angio Neck W And/or Wo Contrast  Result Date: 03/02/2018 CLINICAL DATA:  Left-sided weakness EXAM: CT ANGIOGRAPHY HEAD AND NECK CT  PERFUSION BRAIN TECHNIQUE: Multidetector CT imaging of the head and neck was performed using the standard protocol during bolus administration of intravenous contrast. Multiplanar CT image reconstructions and MIPs were obtained to evaluate the vascular anatomy. Carotid stenosis measurements (when applicable) are obtained utilizing NASCET criteria, using the distal internal carotid diameter as the denominator. Multiphase CT imaging of the brain was performed following IV bolus contrast injection. Subsequent parametric perfusion maps were calculated using RAPID software. CONTRAST:  69mL ISOVUE-370 IOPAMIDOL (ISOVUE-370) INJECTION 76% COMPARISON:  None. FINDINGS: CTA NECK FINDINGS SKELETON: There is no bony spinal canal stenosis. No lytic or blastic lesion. OTHER NECK: Normal pharynx, larynx and major salivary glands. No cervical lymphadenopathy. Unremarkable thyroid gland. UPPER CHEST: No pneumothorax or pleural effusion. No nodules or masses. AORTIC ARCH: There is mild calcific atherosclerosis of the aortic arch. There is no aneurysm, dissection or hemodynamically significant stenosis of the visualized ascending aorta and aortic arch. Conventional 3 vessel aortic branching pattern. The visualized proximal subclavian arteries are widely patent. RIGHT CAROTID SYSTEM: --Common carotid artery: Widely patent  origin without common carotid artery dissection or aneurysm. --Internal carotid artery: No dissection, occlusion or aneurysm. There is mixed density atherosclerosis extending into the proximal ICA, resulting in less than 50% stenosis. --External carotid artery: No acute abnormality. LEFT CAROTID SYSTEM: --Common carotid artery: Widely patent origin without common carotid artery dissection or aneurysm. --Internal carotid artery: No dissection, occlusion or aneurysm. There is mixed density atherosclerosis extending into the proximal ICA, resulting in less than 50% stenosis. --External carotid artery: No acute  abnormality. VERTEBRAL ARTERIES: Left dominant configuration. There is moderate narrowing of both vertebral artery origins. No dissection, occlusion or flow-limiting stenosis to the vertebrobasilar confluence. CTA HEAD FINDINGS ANTERIOR CIRCULATION: --Intracranial internal carotid arteries: Atherosclerotic calcification of the internal carotid arteries at the skull base without hemodynamically significant stenosis. --Anterior cerebral arteries: Normal. Both A1 segments are present. Patent anterior communicating artery. --Middle cerebral arteries: There is a short segment occlusion of the proximal right M2 segment. The length of the occluded segment is approximately 5 mm. --Posterior communicating arteries: Present bilaterally. POSTERIOR CIRCULATION: --Basilar artery: Normal. --Posterior cerebral arteries: Normal. --Superior cerebellar arteries: Normal. --Inferior cerebellar arteries: Normal anterior and posterior inferior cerebellar arteries. VENOUS SINUSES: As permitted by contrast timing, patent. ANATOMIC VARIANTS: None DELAYED PHASE: No parenchymal contrast enhancement. Review of the MIP images confirms the above findings. ASPECTS ([place] Stroke Program Early CT Score) - Ganglionic level infarction (caudate, lentiform nuclei, internal capsule, insula, M1-M3 cortex): 5 - Supraganglionic infarction (M4-M6 cortex): 1 Total score (0-10 with 10 being normal): 6 CT Brain Perfusion Findings: CBF (<30%) Volume: 80mL Perfusion (Tmax>6.0s) volume: 61mL Mismatch Volume: 76mL Infarction Location:Posterior right MCA territory IMPRESSION: 1. Short segment occlusion of the right middle cerebral artery proximal M2 segment. 2. 21 mL region of ischemia in the posterior right MCA territory by CT perfusion criteria. The CT perfusion data does not demonstrate a core infarct, but the ASPECT Score is 6 with findings of early infarction in the posterior right MCA territory. 3. Bilateral carotid bifurcation atherosclerosis with less than  50% stenosis. 4.  Aortic atherosclerosis (ICD10-I70.0). These results were called by telephone at the time of interpretation on 03/02/2018 at 6:07 pm to Dr. Daleen Bo , who verbally acknowledged these results. Electronically Signed   By: Ulyses Jarred M.D.   On: 03/02/2018 18:25   Mr Brain Wo Contrast  Result Date: 03/03/2018 CLINICAL DATA:  Stroke follow-up EXAM: MRI HEAD WITHOUT CONTRAST TECHNIQUE: Multiplanar, multiecho pulse sequences of the brain and surrounding structures were obtained without intravenous contrast. COMPARISON:  None. FINDINGS: BRAIN: Large area of acute ischemia within the right MCA territory, involving much of the frontal and parietal cortex and the right insula. No other diffusion abnormality. The midline structures are normal. No midline shift or other mass effect. There are old bilateral cerebellar infarcts and old infarcts of the right basal ganglia and thalamus. Early confluent hyperintense T2-weighted signal of the periventricular and deep white matter, most commonly due to chronic ischemic microangiopathy. There is cytotoxic edema throughout the distribution of the ischemia. The cerebral and cerebellar volume are age-appropriate. 2-3 scattered foci of chronic microhemorrhage. VASCULAR: Major intracranial arterial and venous sinus flow voids are normal. SKULL AND UPPER CERVICAL SPINE: Calvarial bone marrow signal is normal. There is no skull base mass. Visualized upper cervical spine and soft tissues are normal. SINUSES/ORBITS: No fluid levels or advanced mucosal thickening. No mastoid or middle ear effusion. The orbits are normal. IMPRESSION: 1. Large right MCA territory infarct without hemorrhage or mass effect. 2. Findings of chronic  ischemic microangiopathy with multiple small vessel infarcts. Electronically Signed   By: Ulyses Jarred M.D.   On: 03/03/2018 22:23   Ct Cerebral Perfusion W Contrast  Result Date: 03/02/2018 CLINICAL DATA:  Left-sided weakness EXAM: CT  ANGIOGRAPHY HEAD AND NECK CT PERFUSION BRAIN TECHNIQUE: Multidetector CT imaging of the head and neck was performed using the standard protocol during bolus administration of intravenous contrast. Multiplanar CT image reconstructions and MIPs were obtained to evaluate the vascular anatomy. Carotid stenosis measurements (when applicable) are obtained utilizing NASCET criteria, using the distal internal carotid diameter as the denominator. Multiphase CT imaging of the brain was performed following IV bolus contrast injection. Subsequent parametric perfusion maps were calculated using RAPID software. CONTRAST:  23mL ISOVUE-370 IOPAMIDOL (ISOVUE-370) INJECTION 76% COMPARISON:  None. FINDINGS: CTA NECK FINDINGS SKELETON: There is no bony spinal canal stenosis. No lytic or blastic lesion. OTHER NECK: Normal pharynx, larynx and major salivary glands. No cervical lymphadenopathy. Unremarkable thyroid gland. UPPER CHEST: No pneumothorax or pleural effusion. No nodules or masses. AORTIC ARCH: There is mild calcific atherosclerosis of the aortic arch. There is no aneurysm, dissection or hemodynamically significant stenosis of the visualized ascending aorta and aortic arch. Conventional 3 vessel aortic branching pattern. The visualized proximal subclavian arteries are widely patent. RIGHT CAROTID SYSTEM: --Common carotid artery: Widely patent origin without common carotid artery dissection or aneurysm. --Internal carotid artery: No dissection, occlusion or aneurysm. There is mixed density atherosclerosis extending into the proximal ICA, resulting in less than 50% stenosis. --External carotid artery: No acute abnormality. LEFT CAROTID SYSTEM: --Common carotid artery: Widely patent origin without common carotid artery dissection or aneurysm. --Internal carotid artery: No dissection, occlusion or aneurysm. There is mixed density atherosclerosis extending into the proximal ICA, resulting in less than 50% stenosis. --External  carotid artery: No acute abnormality. VERTEBRAL ARTERIES: Left dominant configuration. There is moderate narrowing of both vertebral artery origins. No dissection, occlusion or flow-limiting stenosis to the vertebrobasilar confluence. CTA HEAD FINDINGS ANTERIOR CIRCULATION: --Intracranial internal carotid arteries: Atherosclerotic calcification of the internal carotid arteries at the skull base without hemodynamically significant stenosis. --Anterior cerebral arteries: Normal. Both A1 segments are present. Patent anterior communicating artery. --Middle cerebral arteries: There is a short segment occlusion of the proximal right M2 segment. The length of the occluded segment is approximately 5 mm. --Posterior communicating arteries: Present bilaterally. POSTERIOR CIRCULATION: --Basilar artery: Normal. --Posterior cerebral arteries: Normal. --Superior cerebellar arteries: Normal. --Inferior cerebellar arteries: Normal anterior and posterior inferior cerebellar arteries. VENOUS SINUSES: As permitted by contrast timing, patent. ANATOMIC VARIANTS: None DELAYED PHASE: No parenchymal contrast enhancement. Review of the MIP images confirms the above findings. ASPECTS ([place] Stroke Program Early CT Score) - Ganglionic level infarction (caudate, lentiform nuclei, internal capsule, insula, M1-M3 cortex): 5 - Supraganglionic infarction (M4-M6 cortex): 1 Total score (0-10 with 10 being normal): 6 CT Brain Perfusion Findings: CBF (<30%) Volume: 58mL Perfusion (Tmax>6.0s) volume: 24mL Mismatch Volume: 17mL Infarction Location:Posterior right MCA territory IMPRESSION: 1. Short segment occlusion of the right middle cerebral artery proximal M2 segment. 2. 21 mL region of ischemia in the posterior right MCA territory by CT perfusion criteria. The CT perfusion data does not demonstrate a core infarct, but the ASPECT Score is 6 with findings of early infarction in the posterior right MCA territory. 3. Bilateral carotid bifurcation  atherosclerosis with less than 50% stenosis. 4.  Aortic atherosclerosis (ICD10-I70.0). These results were called by telephone at the time of interpretation on 03/02/2018 at 6:07 pm to Dr. Daleen Bo , who  verbally acknowledged these results. Electronically Signed   By: Ulyses Jarred M.D.   On: 03/02/2018 18:25   Dg Swallowing Func-speech Pathology  Result Date: 03/05/2018 Objective Swallowing Evaluation: Type of Study: MBS-Modified Barium Swallow Study  Patient Details Name: Lenola H Mooney-Riggs MRN: [id number] Date of Birth: [birth year] Today's Date: 03/05/2018 Time: SLP Start Time (ACUTE ONLY): 0945 -SLP Stop Time (ACUTE ONLY): 1010 SLP Time Calculation (min) (ACUTE ONLY): 25 min Past Medical History: Past Medical History: Diagnosis Date . Anemia  . CAD in native artery   a. NSTEMI 12/2005 s/p BMS to Cx, prior normal LVEF. [facility] ([place]) 1988 and  2009  BCC nose and  Melanoma back . Cancer of skin Jan. 2014  [facility] Forehead and Nose . Carotid stenosis   mild . Chronic back pain  . CKD (chronic kidney disease), stage III ([place])  . Coronary atherosclerosis of unspecified type of vessel, native or graft  . DJD (degenerative joint disease) of knee  . History of TIA (transient ischemic attack)  . HTN (hypertension)  . IBS (irritable bowel syndrome)  . Myocardial infarction [facility])   5 yrs ago     dr wall  cardiac . Osteoporosis  . Other diseases of lung, not elsewhere classified   lung nodule . Polymyalgia rheumatica ([place])  . Shingles 01/2016-02/2016 . Stroke [facility])  Past Surgical History: Past Surgical History: Procedure Laterality Date . ABDOMINAL HYSTERECTOMY  1977  partial . BACK SURGERY  1982 . CARDIAC CATHETERIZATION   . COLONOSCOPY N/A 01/27/2013  Procedure: COLONOSCOPY;  Surgeon: Rogene Houston, MD;  Location: [facility];  Service: Endoscopy;  Laterality: N/A;  1030 . EYE SURGERY Bilateral 04/2017 . EYE SURGERY Right 09/23/2017 . IR KYPHO LUMBAR INC FX REDUCE BONE BX UNI/BIL CANNULATION INC/IMAGING  09/30/2017 .  IR VERTEBROPLASTY CERV/THOR BX INC UNI/BIL INC/INJECT/IMAGING  09/05/2017 . JOINT REPLACEMENT  2001 and  2011  6 knee surgeries   bil replacement . KNEE ARTHROPLASTY   . KNEE ARTHROSCOPY   . LUMBAR LAMINECTOMY   . PR VEIN BYPASS GRAFT,AORTO-FEM-POP  2010 . removal of melanoma from back  2007 . SPINE SURGERY  2013  Broken back: fusion surgery . tonisllectomy  1944 . total knee anthroplasty   . VERTEBROPLASTY  07/11/2011  Procedure: VERTEBROPLASTY;  Surgeon: Faythe Ghee, MD;  Location: [place] NEURO ORS;  Service: Neurosurgery;  Laterality: N/A;  Vertebroplasty of Lumbar Two HPI: 84 yo female adm to [facility] with left sided weakness (dense hemiplegia), dysarthria and AMS.  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 PMH + for shingles, PMR, TIA, CAD, recent viral infection- s/p Zpac use per pt.  Swallow and speech evaluation ordered.  Pt did not pass the [facility] swallow screen.   Subjective: Im going to disown my children, they wont give me any water Assessment / Plan / Recommendation CHL IP CLINICAL IMPRESSIONS 03/05/2018 Clinical Impression  Pt demonstrates a moderate oral dysphagia complicated by pt impulsivity, poor awarness and distracitbility. When total assist was given pt was able to seal right side of lips on spoon or straw. Anterior spill more significant with cup; pt often began talking with her mouth full of liquid as well. It verbal cues and extra time pt was able to lingually propel 90% of bolus to pyriforms with moderate left oral residuals. Increased cueing with puree needed. There was a significant delay in swallow initiation with pooled liquid in the pyriforms for several seconds. With thin, this resulted in sensed aspiration before the swallow. Only very  trace sensed penetration of nectar thick liquids observed after the swallow of mild residue mixing with secretions. Depsite deficits, pt tolerated nectar and puree quite well during study and appears to be able to sense, cough and eject trace penetrates that are likely to  occur intermittently. Also of concern was signs of esophageal dysmotility on esophageal sweep (no radiologist present to confirm); esophageal precautions warranted. Will initiate Dys 1/nectar thick diet and monitor for tolerance and educate family and staff about precautions.  SLP Visit Diagnosis Dysphagia, oropharyngeal phase (R13.12) Attention and concentration deficit following -- Frontal lobe and executive function deficit following -- Impact on safety and function Moderate aspiration risk   CHL IP TREATMENT RECOMMENDATION 03/05/2018 Treatment Recommendations Therapy as outlined in treatment plan below   Prognosis 03/03/2018 Prognosis for Safe Diet Advancement Guarded Barriers to Reach Goals Severity of deficits;Behavior Barriers/Prognosis Comment -- CHL IP DIET RECOMMENDATION 03/05/2018 SLP Diet Recommendations Dysphagia 1 (Puree) solids;Nectar thick liquid Liquid Administration via [employer] Medication Administration Crushed with puree Compensations Slow rate;Small sips/bites;Minimize environmental distractions;Monitor for anterior loss;Lingual sweep for clearance of pocketing;Clear throat intermittently Postural Changes Seated upright at 90 degrees;Remain semi-upright after after feeds/meals (Comment)   CHL IP OTHER RECOMMENDATIONS 03/05/2018 Recommended Consults -- Oral Care Recommendations Oral care before and after PO Other Recommendations Have oral suction available   CHL IP FOLLOW UP RECOMMENDATIONS 03/03/2018 Follow up Recommendations (No Data)   CHL IP FREQUENCY AND DURATION 03/05/2018 Speech Therapy Frequency (ACUTE ONLY) min 2x/week Treatment Duration 2 weeks      CHL IP ORAL PHASE 03/05/2018 Oral Phase Impaired Oral - Pudding Teaspoon -- Oral - Pudding Cup -- Oral - Honey Teaspoon -- Oral - Honey Cup -- Oral - Nectar Teaspoon Left anterior bolus loss;Weak lingual manipulation;Reduced posterior propulsion;Left pocketing in lateral sulci;Pocketing in anterior sulcus;Delayed oral transit;Decreased  bolus cohesion;Premature spillage Oral - Nectar Cup -- Oral - Nectar Straw Left anterior bolus loss;Weak lingual manipulation;Reduced posterior propulsion;Left pocketing in lateral sulci;Pocketing in anterior sulcus;Delayed oral transit;Decreased bolus cohesion;Premature spillage Oral - Thin Teaspoon -- Oral - Thin Cup -- Oral - Thin Straw Left anterior bolus loss;Weak lingual manipulation;Reduced posterior propulsion;Left pocketing in lateral sulci;Pocketing in anterior sulcus;Delayed oral transit;Decreased bolus cohesion;Premature spillage Oral - Puree Left anterior bolus loss;Weak lingual manipulation;Reduced posterior propulsion;Left pocketing in lateral sulci;Pocketing in anterior sulcus;Delayed oral transit;Decreased bolus cohesion;Premature spillage Oral - Mech Soft -- Oral - Regular -- Oral - Multi-Consistency -- Oral - Pill -- Oral Phase - Comment --  CHL IP PHARYNGEAL PHASE 03/05/2018 Pharyngeal Phase Impaired Pharyngeal- Pudding Teaspoon -- Pharyngeal -- Pharyngeal- Pudding Cup -- Pharyngeal -- Pharyngeal- Honey Teaspoon -- Pharyngeal -- Pharyngeal- Honey Cup -- Pharyngeal -- Pharyngeal- Nectar Teaspoon Delayed swallow initiation-pyriform sinuses Pharyngeal -- Pharyngeal- Nectar Cup -- Pharyngeal -- Pharyngeal- Nectar Straw Delayed swallow initiation-pyriform sinuses;Penetration/Apiration after swallow;Pharyngeal residue - valleculae;Pharyngeal residue - pyriform Pharyngeal Material enters airway, CONTACTS cords and then ejected out;Material does not enter airway Pharyngeal- Thin Teaspoon -- Pharyngeal -- Pharyngeal- Thin Cup -- Pharyngeal -- Pharyngeal- Thin Straw Delayed swallow initiation-pyriform sinuses;Pharyngeal residue - valleculae;Pharyngeal residue - pyriform;Penetration/Aspiration before swallow;Trace aspiration Pharyngeal Material enters airway, passes BELOW cords then ejected out Pharyngeal- Puree Delayed swallow initiation-vallecula Pharyngeal -- Pharyngeal- Mechanical Soft -- Pharyngeal --  Pharyngeal- Regular -- Pharyngeal -- Pharyngeal- Multi-consistency -- Pharyngeal -- Pharyngeal- Pill -- Pharyngeal -- Pharyngeal Comment --  No flowsheet data found. Herbie Baltimore, MA CCC-SLP Acute Rehabilitation Services Pager [phone] Office [phone] Lynann Beaver 03/05/2018, 10:35 AM  Ct Head Code Stroke Wo Contrast  Result Date: 03/02/2018 CLINICAL DATA:  Code stroke.  Left-sided weakness EXAM: CT HEAD WITHOUT CONTRAST TECHNIQUE: Contiguous axial images were obtained from the base of the skull through the vertex without intravenous contrast. COMPARISON:  None. FINDINGS: Brain: There is no mass, hemorrhage or extra-axial collection. The size and configuration of the ventricles and extra-axial CSF spaces are normal. There is hypoattenuation within the posterior right MCA territory suggesting acute or early subacute infarct. No midline shift or other mass effect. 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 Vascular: Mild hyperattenuation within the right MCA M2 segment. Bilateral distal ICA atherosclerotic calcification. Skull: The visualized skull base, calvarium and extracranial soft tissues are normal. Sinuses/Orbits: No fluid levels or advanced mucosal thickening of the visualized paranasal sinuses. No mastoid or middle ear effusion. The orbits are normal. ASPECTS [facility] Stroke Program Early CT Score) - Ganglionic level infarction (caudate, lentiform nuclei, internal capsule, insula, M1-M3 cortex): 5 - Supraganglionic infarction (M4-M6 cortex): 1 Total score (0-10 with 10 being normal): 6 IMPRESSION: 1. No acute hemorrhage. 2. Hypoattenuation in the posterior right MCA territory consistent with acute or early subacute infarct. 3. ASPECTS is 6. These results were called by telephone at the time of interpretation on 03/02/2018 at 5:47 pm to Dr. Daleen Bo , who verbally acknowledged these results. Electronically Signed    By: Ulyses Jarred M.D.   On: 03/02/2018 17:53    Microbiology: No results found for this or any previous visit (from the past 240 hour(s)).   Labs: Basic Metabolic Panel: Recent Labs  Lab 03/02/18 1729 03/02/18 1730 03/04/18 0428 03/05/18 0407 03/06/18 0423  NA 138 140 137 139 140  K 4.1 4.4 4.4 4.2 3.7  CL 103 103 106 107 110  CO2 26  --  20* 18* 18*  GLUCOSE 117* 114* 83 114* 109*  BUN 13 14 15 17 15   CREATININE 1.20* 1.20* 1.19* 0.99 1.00  CALCIUM 8.9  --  8.4* 8.5* 8.8*   Liver Function Tests: Recent Labs  Lab 03/02/18 1729  AST 20  ALT 12  ALKPHOS 83  BILITOT 0.9  PROT 7.6  ALBUMIN 3.6   No results for input(s): LIPASE, AMYLASE in the last 168 hours. No results for input(s): AMMONIA in the last 168 hours. CBC: Recent Labs  Lab 03/02/18 1729 03/02/18 1730 03/04/18 0428 03/06/18 0423  WBC 7.7  --  6.9 9.3  NEUTROABS 5.2  --   --   --   HGB 11.3* 12.9 11.3* 11.3*  HCT 38.0 38.0 37.0 37.7  MCV 89.6  --  88.3 87.7  PLT 381  --  345 354   Cardiac Enzymes: No results for input(s): CKTOTAL, CKMB, CKMBINDEX, TROPONINI in the last 168 hours. BNP: BNP (last 3 results) No results for input(s): BNP in the last 8760 hours.  ProBNP (last 3 results) No results for input(s): PROBNP in the last 8760 hours.  CBG: Recent Labs  Lab 03/02/18 1725  GLUCAP 112*       Signed:  Daleisa Halperin  [facility] 03/06/2018, 2:21 PM

## 2018-03-07 LAB — BASIC METABOLIC PANEL
ANION GAP: 7 (ref 5–15)
BUN: 18 mg/dL (ref 8–23)
CALCIUM: 8.4 mg/dL — AB (ref 8.9–10.3)
CHLORIDE: 115 mmol/L — AB (ref 98–111)
CO2: 19 mmol/L — ABNORMAL LOW (ref 22–32)
CREATININE: 0.97 mg/dL (ref 0.44–1.00)
GFR calc non Af Amer: 52 mL/min — ABNORMAL LOW (ref >60)
Glucose, Bld: 102 mg/dL — ABNORMAL HIGH (ref 70–99)
Potassium: 4 mmol/L (ref 3.5–5.1)
SODIUM: 141 mmol/L (ref 135–145)

## 2018-03-07 MED ORDER — STARCH (THICKENING) PO POWD
ORAL | Status: DC | PRN
  Filled 2018-03-07: qty 227

## 2018-03-07 MED ORDER — MORPHINE SULFATE (PF) 2 MG/ML IV SOLN
1.0000 mg | Freq: Once | INTRAVENOUS | Status: AC
  Administered 2018-03-07: 1 mg via INTRAVENOUS
  Filled 2018-03-07: qty 1

## 2018-03-07 MED ORDER — RESOURCE THICKENUP CLEAR PO POWD
ORAL | Status: DC | PRN
  Filled 2018-03-07: qty 125

## 2018-03-07 MED ORDER — METOPROLOL TARTRATE 5 MG/5ML IV SOLN
2.5000 mg | Freq: Four times a day (QID) | INTRAVENOUS | Status: DC | PRN
  Administered 2018-03-07 – 2018-03-08 (×3): 2.5 mg via INTRAVENOUS
  Filled 2018-03-07 (×3): qty 5

## 2018-03-07 MED ORDER — TRAMADOL HCL 50 MG PO TABS
50.0000 mg | ORAL_TABLET | Freq: Four times a day (QID) | ORAL | Status: DC | PRN
  Administered 2018-03-08: 50 mg via ORAL
  Filled 2018-03-07: qty 1

## 2018-03-07 MED ORDER — METOPROLOL TARTRATE 50 MG PO TABS
75.0000 mg | ORAL_TABLET | Freq: Two times a day (BID) | ORAL | Status: DC
  Administered 2018-03-07 – 2018-03-09 (×5): 75 mg via ORAL
  Filled 2018-03-07 (×5): qty 1

## 2018-03-07 NOTE — Progress Notes (Signed)
CSW spoke with son who reports they will continue to appeal. He requested that [place] ask [facility] to hold bed, CSW attempted to call [facility] multiple times and was unable to reach anyone on this day.   [place], [place]

## 2018-03-07 NOTE — Progress Notes (Signed)
PROGRESS NOTE    Brenda Hall  KYH:[id number] DOB: 1930/04/25 DOA: 03/02/2018 PCP: [name], MD   Brief Narrative:  HPI On 03/02/2018 by Dr. Zada Finders Brenda Hall is a 84 y.o. female with medical history significant for atrial fibrillation on Xarelto, CAD, history of CVA, hypertension, CKD stage III, and polymyalgia rheumatica who presents to the ED with left-sided weakness, facial droop, and slurred speech.  Time last well-known was around 9 AM.  Patient is able to provide most of recent history.  Her son and daughter-in-law are present during the time of my evaluation and provide additional history.  Patient states she has recently been dealing with a upper respiratory infection which has not been improving with oral azithromycin.  She called her PCP today and was prescribed Augmentin.  Her grandson visited her at home and saw her sitting down in the chair.  Around 4 PM she attempted to get up but was having difficulty lifting her left leg off the floor.  She was also having slurred speech and she was noted to have drooping at the left side of her face.  She says she had eye surgery on her right eye 4 days ago with right eye drooping since that time.  She has been on Xarelto for atrial fibrillation and says she has been taking it every night and has not seen any obvious bleeding.  She also reports recently completing a slow taper off of oral prednisone for treatment of her polymyalgia rheumatica.  She denies any chest pain, palpitations, dyspnea, abdominal pain, or dysuria.  Interim history Admitted for acute CVA.  Neurology consulted and appreciated, work-up completed. Patient was stable for discharge on 11/15/209, however family is appealing discharge. Assessment & Plan   Acute CVA  -presented with left-sided weakness, facial droop and dysarthria -CT head showed changes consistent with acute or early subacute infarction and posterior right MCA territory, no  hemorrhage -CTA head and neck cerebral perfusion study showed short segment occlusion of right MCA proximal M2 -MRI large right MCA territory infarct without hemorrhage or mass-effect. -Echocardiogram EF 65 to 15%, grade 1 diastolic dysfunction.  PA peak pressure of 60 mmHg.  Sclerotic aortic valve, mild TR, moderate to severe pulmonary hypertension. -Hemoglobin A1c 5.5, LDL 65 -Neurology consulted and appreciated, recommended switching Xarelto to Eliquis when patient is able to swallow.  Continue aspirin for now. -Speech recommending dysphagia 1 diet -PT recommended SNF -OT recommended CIR  Paroxysmal atrial fibrillation/atrial fibrillation with RVR -Toprol held permissive hypertension -started metoprolol 75mg  BID and will continue metoprolol 2.5mg  IV q6h PRN as needed for RVR  Essential hypertension -Amlodipine and Toprol held for permissive hypertension  CAD -Continue aspirin  CKD stage III -Creatinine appears to be at baseline, continue to monitor BMP  PMR -Patient follows with Dr. Estanislado Pandy -She has been tapered off of prednisone and started on abaloparatide- which is held for now  Pain -Complains of back pain and states she had back surgery in the past -will add on one dose of morphine along with tramadol PRN  DVT Prophylaxis  Heparin  Code Status: Full  Family Communication: None at bedside  Disposition Plan: Admitted. Workup completed. Patient was discharged on 11/15, however patient's family appealing.  Consultants Neurology  Procedures  Echocardiogram  Antibiotics   Anti-infectives (From admission, onward)   None      Subjective:   Brenda Hall seen and examined today.  Patient complains of back pain and sore throat.  Wants to drink  some water.  Appreciative of sips of water given.  Patient states that she has had a sore throat for a few weeks as she was sick prior to admission.  Currently denies chest pain, shortness of breath, abdominal pain,  nausea or vomiting, diarrhea constipation.    Objective:   Vitals:   03/07/18 0356 03/07/18 0400 03/07/18 0607 03/07/18 0800  BP: 107/66  126/75 (!) 157/71  Pulse: 60 (!) 49 79 (!) 137  Resp: (!) 24 19 (!) 23 (!) 21  Temp: 98.2 F (36.8 C)     TempSrc: Axillary     SpO2: 96% 95% 96% 97%  Weight:      Height:        Intake/Output Summary (Last 24 hours) at 03/07/2018 0846 Last data filed at 03/07/2018 0300 Gross per 24 hour  Intake 240 ml  Output 150 ml  Net 90 ml   Filed Weights   03/02/18 1724  Weight: 69.6 kg   Exam  General: Well developed, elderly, NAD  HEENT: NCAT, mucous membranes moist.   Neck: Supple  Cardiovascular: S1 S2 auscultated, irregularly irregular  Respiratory: Clear to auscultation bilaterally  Abdomen: Soft, nontender, nondistended, + bowel sounds  Extremities: warm dry without cyanosis clubbing or edema  Neuro: AAOx3, left sided weakness  Psych: Appropriate mood and affect  Data Reviewed: I have personally reviewed following labs and imaging studies  CBC: Recent Labs  Lab 03/02/18 1729 03/02/18 1730 03/04/18 0428 03/06/18 0423  WBC 7.7  --  6.9 9.3  NEUTROABS 5.2  --   --   --   HGB 11.3* 12.9 11.3* 11.3*  HCT 38.0 38.0 37.0 37.7  MCV 89.6  --  88.3 87.7  PLT 381  --  345 619   Basic Metabolic Panel: Recent Labs  Lab 03/02/18 1729 03/02/18 1730 03/04/18 0428 03/05/18 0407 03/06/18 0423 03/07/18 0709  NA 138 140 137 139 140 141  K 4.1 4.4 4.4 4.2 3.7 4.0  CL 103 103 106 107 110 115*  CO2 26  --  20* 18* 18* 19*  GLUCOSE 117* 114* 83 114* 109* 102*  BUN 13 14 15 17 15 18   CREATININE 1.20* 1.20* 1.19* 0.99 1.00 0.97  CALCIUM 8.9  --  8.4* 8.5* 8.8* 8.4*   GFR: Estimated Creatinine Clearance: 41.5 mL/min (by C-G formula based on SCr of 0.97 mg/dL). Liver Function Tests: Recent Labs  Lab 03/02/18 1729  AST 20  ALT 12  ALKPHOS 83  BILITOT 0.9  PROT 7.6  ALBUMIN 3.6   No results for input(s): LIPASE, AMYLASE  in the last 168 hours. No results for input(s): AMMONIA in the last 168 hours. Coagulation Profile: Recent Labs  Lab 03/02/18 1729  INR 1.73   Cardiac Enzymes: No results for input(s): CKTOTAL, CKMB, CKMBINDEX, TROPONINI in the last 168 hours. BNP (last 3 results) No results for input(s): PROBNP in the last 8760 hours. HbA1C: No results for input(s): HGBA1C in the last 72 hours. CBG: Recent Labs  Lab 03/02/18 1725  GLUCAP 112*   Lipid Profile: No results for input(s): CHOL, HDL, LDLCALC, TRIG, CHOLHDL, LDLDIRECT in the last 72 hours. Thyroid Function Tests: No results for input(s): TSH, T4TOTAL, FREET4, T3FREE, THYROIDAB in the last 72 hours. Anemia Panel: No results for input(s): VITAMINB12, FOLATE, FERRITIN, TIBC, IRON, RETICCTPCT in the last 72 hours. Urine analysis:    Component Value Date/Time   COLORURINE YELLOW 03/02/2018 1729   APPEARANCEUR HAZY (A) 03/02/2018 1729   LABSPEC 1.023 03/02/2018 1729  PHURINE 8.0 03/02/2018 1729   GLUCOSEU NEGATIVE 03/02/2018 1729   HGBUR NEGATIVE 03/02/2018 1729   BILIRUBINUR NEGATIVE 03/02/2018 1729   KETONESUR NEGATIVE 03/02/2018 1729   PROTEINUR NEGATIVE 03/02/2018 1729   UROBILINOGEN 0.2 07/28/2008 1156   NITRITE NEGATIVE 03/02/2018 1729   LEUKOCYTESUR NEGATIVE 03/02/2018 1729   Sepsis Labs: @LABRCNTIP (procalcitonin:4,lacticidven:4)  )No results found for this or any previous visit (from the past 240 hour(s)).    Radiology Studies: Dg Swallowing Func-speech Pathology  Result Date: 03/05/2018 Objective Swallowing Evaluation: Type of Study: MBS-Modified Barium Swallow Study  Patient Details Name: Brenda Hall MRN: [id number] Date of Birth: 09/30/1930 Today's Date: 03/05/2018 Time: SLP Start Time (ACUTE ONLY): 0945 -SLP Stop Time (ACUTE ONLY): 1010 SLP Time Calculation (min) (ACUTE ONLY): 25 min Past Medical History: Past Medical History: Diagnosis Date . Anemia  . CAD in native artery   a. NSTEMI 12/2005 s/p BMS to Cx,  prior normal LVEF. Marland Kitchen Cancer ([place]) 1988 and  2009  BCC nose and  Melanoma back . Cancer of skin Jan. 2014  [facility] Forehead and Nose . Carotid stenosis   mild . Chronic back pain  . CKD (chronic kidney disease), stage III ([place])  . Coronary atherosclerosis of unspecified type of vessel, native or graft  . DJD (degenerative joint disease) of knee  . History of TIA (transient ischemic attack)  . HTN (hypertension)  . IBS (irritable bowel syndrome)  . Myocardial infarction [facility])   5 yrs ago     dr wall  cardiac . Osteoporosis  . Other diseases of lung, not elsewhere classified   lung nodule . Polymyalgia rheumatica ([place])  . Shingles 01/2016-02/2016 . Stroke [facility])  Past Surgical History: Past Surgical History: Procedure Laterality Date . ABDOMINAL HYSTERECTOMY  1977  partial . BACK SURGERY  1982 . CARDIAC CATHETERIZATION   . COLONOSCOPY N/A 01/27/2013  Procedure: COLONOSCOPY;  Surgeon: Rogene Houston, MD;  [facility];  Service: Endoscopy;  Laterality: N/A;  1030 . EYE SURGERY Bilateral 04/2017 . EYE SURGERY Right 09/23/2017 . IR KYPHO LUMBAR INC FX REDUCE BONE BX UNI/BIL CANNULATION INC/IMAGING  09/30/2017 . IR VERTEBROPLASTY CERV/THOR BX INC UNI/BIL INC/INJECT/IMAGING  09/05/2017 . JOINT REPLACEMENT  2001 and  2011  6 knee surgeries   bil replacement . KNEE ARTHROPLASTY   . KNEE ARTHROSCOPY   . LUMBAR LAMINECTOMY   . PR VEIN BYPASS GRAFT,AORTO-FEM-POP  2010 . removal of melanoma from back  2007 . SPINE SURGERY  2013  Broken back: fusion surgery . tonisllectomy  1944 . total knee anthroplasty   . VERTEBROPLASTY  07/11/2011  Procedure: VERTEBROPLASTY;  Surgeon: Faythe Ghee, MD;  Location: [place] NEURO ORS;  Service: Neurosurgery;  Laterality: N/A;  Vertebroplasty of Lumbar Two HPI: 84 yo female adm to [facility] with left sided weakness (dense hemiplegia), dysarthria and AMS.  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 PMH + for shingles, PMR, TIA, CAD, recent viral infection- s/p Zpac use per pt.  Swallow and speech evaluation  ordered.  Pt did not pass the [place] swallow screen.   Subjective: Im going to disown my children, they wont give me any water Assessment / Plan / Recommendation CHL IP CLINICAL IMPRESSIONS 03/05/2018 Clinical Impression  Pt demonstrates a moderate oral dysphagia complicated by pt impulsivity, poor awarness and distracitbility. When total assist was given pt was able to seal right side of lips on spoon or straw. Anterior spill more significant with cup; pt often began talking with her mouth full of liquid  as well. It verbal cues and extra time pt was able to lingually propel 90% of bolus to pyriforms with moderate left oral residuals. Increased cueing with puree needed. There was a significant delay in swallow initiation with pooled liquid in the pyriforms for several seconds. With thin, this resulted in sensed aspiration before the swallow. Only very trace sensed penetration of nectar thick liquids observed after the swallow of mild residue mixing with secretions. Depsite deficits, pt tolerated nectar and puree quite well during study and appears to be able to sense, cough and eject trace penetrates that are likely to occur intermittently. Also of concern was signs of esophageal dysmotility on esophageal sweep (no radiologist present to confirm); esophageal precautions warranted. Will initiate Dys 1/nectar thick diet and monitor for tolerance and educate family and staff about precautions.  SLP Visit Diagnosis Dysphagia, oropharyngeal phase (R13.12) Attention and concentration deficit following -- Frontal lobe and executive function deficit following -- Impact on safety and function Moderate aspiration risk   CHL IP TREATMENT RECOMMENDATION 03/05/2018 Treatment Recommendations Therapy as outlined in treatment plan below   Prognosis 03/03/2018 Prognosis for Safe Diet Advancement Guarded Barriers to Reach Goals Severity of deficits;Behavior Barriers/Prognosis Comment -- CHL IP DIET RECOMMENDATION 03/05/2018 SLP Diet  Recommendations Dysphagia 1 (Puree) solids;Nectar thick liquid Liquid Administration via [employer] Medication Administration Crushed with puree Compensations Slow rate;Small sips/bites;Minimize environmental distractions;Monitor for anterior loss;Lingual sweep for clearance of pocketing;Clear throat intermittently Postural Changes Seated upright at 90 degrees;Remain semi-upright after after feeds/meals (Comment)   CHL IP OTHER RECOMMENDATIONS 03/05/2018 Recommended Consults -- Oral Care Recommendations Oral care before and after PO Other Recommendations Have oral suction available   CHL IP FOLLOW UP RECOMMENDATIONS 03/03/2018 Follow up Recommendations (No Data)   CHL IP FREQUENCY AND DURATION 03/05/2018 Speech Therapy Frequency (ACUTE ONLY) min 2x/week Treatment Duration 2 weeks      CHL IP ORAL PHASE 03/05/2018 Oral Phase Impaired Oral - Pudding Teaspoon -- Oral - Pudding Cup -- Oral - Honey Teaspoon -- Oral - Honey Cup -- Oral - Nectar Teaspoon Left anterior bolus loss;Weak lingual manipulation;Reduced posterior propulsion;Left pocketing in lateral sulci;Pocketing in anterior sulcus;Delayed oral transit;Decreased bolus cohesion;Premature spillage Oral - Nectar Cup -- Oral - Nectar Straw Left anterior bolus loss;Weak lingual manipulation;Reduced posterior propulsion;Left pocketing in lateral sulci;Pocketing in anterior sulcus;Delayed oral transit;Decreased bolus cohesion;Premature spillage Oral - Thin Teaspoon -- Oral - Thin Cup -- Oral - Thin Straw Left anterior bolus loss;Weak lingual manipulation;Reduced posterior propulsion;Left pocketing in lateral sulci;Pocketing in anterior sulcus;Delayed oral transit;Decreased bolus cohesion;Premature spillage Oral - Puree Left anterior bolus loss;Weak lingual manipulation;Reduced posterior propulsion;Left pocketing in lateral sulci;Pocketing in anterior sulcus;Delayed oral transit;Decreased bolus cohesion;Premature spillage Oral - Mech Soft -- Oral - Regular -- Oral -  Multi-Consistency -- Oral - Pill -- Oral Phase - Comment --  CHL IP PHARYNGEAL PHASE 03/05/2018 Pharyngeal Phase Impaired Pharyngeal- Pudding Teaspoon -- Pharyngeal -- Pharyngeal- Pudding Cup -- Pharyngeal -- Pharyngeal- Honey Teaspoon -- Pharyngeal -- Pharyngeal- Honey Cup -- Pharyngeal -- Pharyngeal- Nectar Teaspoon Delayed swallow initiation-pyriform sinuses Pharyngeal -- Pharyngeal- Nectar Cup -- Pharyngeal -- Pharyngeal- Nectar Straw Delayed swallow initiation-pyriform sinuses;Penetration/Apiration after swallow;Pharyngeal residue - valleculae;Pharyngeal residue - pyriform Pharyngeal Material enters airway, CONTACTS cords and then ejected out;Material does not enter airway Pharyngeal- Thin Teaspoon -- Pharyngeal -- Pharyngeal- Thin Cup -- Pharyngeal -- Pharyngeal- Thin Straw Delayed swallow initiation-pyriform sinuses;Pharyngeal residue - valleculae;Pharyngeal residue - pyriform;Penetration/Aspiration before swallow;Trace aspiration Pharyngeal Material enters airway, passes BELOW cords then ejected out Pharyngeal- Puree Delayed swallow  initiation-vallecula Pharyngeal -- Pharyngeal- Mechanical Soft -- Pharyngeal -- Pharyngeal- Regular -- Pharyngeal -- Pharyngeal- Multi-consistency -- Pharyngeal -- Pharyngeal- Pill -- Pharyngeal -- Pharyngeal Comment --  No flowsheet data found. Herbie Baltimore, MA CCC-SLP Acute Rehabilitation Services Pager [phone] Office [phone] Lynann Beaver 03/05/2018, 10:35 AM                Scheduled Meds: . aspirin  300 mg Rectal Daily   Or  . aspirin  325 mg Oral Daily  . atorvastatin  80 mg Oral QHS  . heparin  5,000 Units Subcutaneous Q8H  . metoprolol tartrate  75 mg Oral BID  . pantoprazole  40 mg Oral q morning - 10a   Continuous Infusions: . sodium chloride 75 mL/hr at 03/06/18 2211     LOS: 5 days   Time Spent in minutes   30 minutes  Naarah Borgerding D.O. on 03/07/2018 at 8:46 AM  Between 7am to 7pm - Please see pager noted on  [email or web address]  After 7pm go to [email or web address]  And look for the night coverage person covering for me after hours  [facility] Office  [phone]

## 2018-03-07 NOTE — Progress Notes (Signed)
CSW lvm with son regarding if they would allow patient to d/c today, was medically ready on 11/15 and [facility] is prepared to receive patient today if possible.   [place], [place]

## 2018-03-07 NOTE — Care Management (Addendum)
Discharge appeal in "clinical review" according to [facility] website.   03/08/18 0946 Discharge appeal pending.   03/08/18 1730 Appeal remains in pending status.

## 2018-03-07 NOTE — Progress Notes (Addendum)
NCM faxed documents for appeal to [place] 9:00 am 11/16.  Awaiting to hear back from [facility] regarding appeal decision.  Left Claudie Leach NCM phone number for contact [phone].   The Case ID number is [id number]

## 2018-03-08 ENCOUNTER — Inpatient Hospital Stay (HOSPITAL_COMMUNITY): Payer: Medicare Other

## 2018-03-08 LAB — HEMOGLOBIN AND HEMATOCRIT, BLOOD
HCT: 37.5 % (ref 36.0–46.0)
HEMOGLOBIN: 11 g/dL — AB (ref 12.0–15.0)

## 2018-03-08 LAB — BASIC METABOLIC PANEL
Anion gap: 7 (ref 5–15)
BUN: 17 mg/dL (ref 8–23)
CO2: 19 mmol/L — ABNORMAL LOW (ref 22–32)
Calcium: 8.5 mg/dL — ABNORMAL LOW (ref 8.9–10.3)
Chloride: 111 mmol/L (ref 98–111)
Creatinine, Ser: 0.86 mg/dL (ref 0.44–1.00)
GFR calc Af Amer: >60 mL/min (ref >60)
GFR calc non Af Amer: 60 mL/min — ABNORMAL LOW (ref >60)
Glucose, Bld: 106 mg/dL — ABNORMAL HIGH (ref 70–99)
Potassium: 4 mmol/L (ref 3.5–5.1)
Sodium: 137 mmol/L (ref 135–145)

## 2018-03-08 LAB — MAGNESIUM: Magnesium: 1.7 mg/dL (ref 1.7–2.4)

## 2018-03-08 MED ORDER — FUROSEMIDE 10 MG/ML IJ SOLN
10.0000 mg | Freq: Once | INTRAMUSCULAR | Status: AC
  Administered 2018-03-08: 10 mg via INTRAVENOUS
  Filled 2018-03-08: qty 4

## 2018-03-08 MED ORDER — DILTIAZEM HCL 30 MG PO TABS
30.0000 mg | ORAL_TABLET | Freq: Three times a day (TID) | ORAL | Status: DC
  Administered 2018-03-08 – 2018-03-09 (×4): 30 mg via ORAL
  Filled 2018-03-08 (×4): qty 1

## 2018-03-08 NOTE — Progress Notes (Addendum)
PROGRESS NOTE    Brenda Hall  YKD:[id number] DOB: October 30, 1930 DOA: 03/02/2018 PCP: [name], MD   Brief Narrative:  HPI On 03/02/2018 by Dr. Zada Finders Brenda Hall is a 84 y.o. female with medical history significant for atrial fibrillation on Xarelto, CAD, history of CVA, hypertension, CKD stage III, and polymyalgia rheumatica who presents to the ED with left-sided weakness, facial droop, and slurred speech.  Time last well-known was around 9 AM.  Patient is able to provide most of recent history.  Her son and daughter-in-law are present during the time of my evaluation and provide additional history.  Patient states she has recently been dealing with a upper respiratory infection which has not been improving with oral azithromycin.  She called her PCP today and was prescribed Augmentin.  Her grandson visited her at home and saw her sitting down in the chair.  Around 4 PM she attempted to get up but was having difficulty lifting her left leg off the floor.  She was also having slurred speech and she was noted to have drooping at the left side of her face.  She says she had eye surgery on her right eye 4 days ago with right eye drooping since that time.  She has been on Xarelto for atrial fibrillation and says she has been taking it every night and has not seen any obvious bleeding.  She also reports recently completing a slow taper off of oral prednisone for treatment of her polymyalgia rheumatica.  She denies any chest pain, palpitations, dyspnea, abdominal pain, or dysuria.  Interim history Admitted for acute CVA.  Neurology consulted and appreciated, work-up completed. Patient was stable for discharge on 11/15/209, however family is appealing discharge. Assessment & Plan   Acute CVA  -presented with left-sided weakness, facial droop and dysarthria -CT head showed changes consistent with acute or early subacute infarction and posterior right MCA territory, no  hemorrhage -CTA head and neck cerebral perfusion study showed short segment occlusion of right MCA proximal M2 -MRI large right MCA territory infarct without hemorrhage or mass-effect. -Echocardiogram EF 65 to 39%, grade 1 diastolic dysfunction.  PA peak pressure of 60 mmHg.  Sclerotic aortic valve, mild TR, moderate to severe pulmonary hypertension. -Hemoglobin A1c 5.5, LDL 65 -Neurology consulted and appreciated, recommended switching Xarelto to Eliquis when patient is able to swallow.  Continue aspirin for now. -Speech recommending dysphagia 1 diet -PT recommended SNF -OT recommended CIR  Paroxysmal atrial fibrillation/atrial fibrillation with RVR -Toprol held permissive hypertension -Continue metoprolol 75mg  BID and will continue metoprolol 2.5mg  IV q6h PRN as needed for RVR -will add low dose cardizem and monitor   Essential hypertension -Amlodipine held for permissive hypertension -back on metoprolol for AF   CAD -Continue aspirin  CKD stage III -Creatinine appears to be at baseline, continue to monitor BMP  PMR -Patient follows with Dr. Estanislado Pandy -She has been tapered off of prednisone and started on abaloparatide- which is held for now  Pain -Complains of back pain and states she had back surgery in the past -Continue tramadol PRN  Cough -obtained CXR, reviewed and unremarkable for infection -patient states she was sick 2 weeks prior to admission and had a sore throat  LUE edema -likely due to IV infiltration and IVF -discontinue IV fluid -elevated arm  Rash -likely heat related, on abdomen/back -no pruritis, hives  DVT Prophylaxis  Heparin  Code Status: Full  Family Communication: None at bedside  Disposition Plan: Admitted. Workup completed. Patient  was discharged on 11/15, however patient's family appealing.  Consultants Neurology  Procedures  Echocardiogram  Antibiotics   Anti-infectives (From admission, onward)   None      Subjective:    Brenda Hall seen and examined today. Complaining neck pain and wanting water. Denies current chest pain, shortness of breath, abdominal pain, N/V/D/C.   Objective:   Vitals:   03/07/18 2000 03/07/18 2040 03/07/18 2100 03/08/18 0800  BP:   (!) 147/113 (!) 124/102  Pulse: (!) 144 (!) 145 (!) 135 (!) 155  Resp: (!) 27  (!) 25 (!) 24  Temp:    98 F (36.7 C)  TempSrc:    Axillary  SpO2: 97%  91% 93%  Weight:      Height:        Intake/Output Summary (Last 24 hours) at 03/08/2018 1004 Last data filed at 03/08/2018 0500 Gross per 24 hour  Intake 210 ml  Output 250 ml  Net -40 ml   Filed Weights   03/02/18 1724  Weight: 69.6 kg   Exam  General: Well developed, elderly, NAD  HEENT: NCAT, mucous membranes moist.   Neck: Supple  Cardiovascular: S1 S2 auscultated, irregularly irregular  Respiratory: Clear to auscultation bilaterally with equal chest rise  Abdomen: Soft, nontender, nondistended, + bowel sounds  Extremities: warm dry without cyanosis clubbing or edema  Neuro: AAOx3, left sided weakness  Skin: rash on abdomen/back   Psych: Normal affect and demeanor   Data Reviewed: I have personally reviewed following labs and imaging studies  CBC: Recent Labs  Lab 03/02/18 1729 03/02/18 1730 03/04/18 0428 03/06/18 0423  WBC 7.7  --  6.9 9.3  NEUTROABS 5.2  --   --   --   HGB 11.3* 12.9 11.3* 11.3*  HCT 38.0 38.0 37.0 37.7  MCV 89.6  --  88.3 87.7  PLT 381  --  345 333   Basic Metabolic Panel: Recent Labs  Lab 03/02/18 1729 03/02/18 1730 03/04/18 0428 03/05/18 0407 03/06/18 0423 03/07/18 0709  NA 138 140 137 139 140 141  K 4.1 4.4 4.4 4.2 3.7 4.0  CL 103 103 106 107 110 115*  CO2 26  --  20* 18* 18* 19*  GLUCOSE 117* 114* 83 114* 109* 102*  BUN 13 14 15 17 15 18   CREATININE 1.20* 1.20* 1.19* 0.99 1.00 0.97  CALCIUM 8.9  --  8.4* 8.5* 8.8* 8.4*   GFR: Estimated Creatinine Clearance: 41.5 mL/min (by C-G formula based on SCr of 0.97  mg/dL). Liver Function Tests: Recent Labs  Lab 03/02/18 1729  AST 20  ALT 12  ALKPHOS 83  BILITOT 0.9  PROT 7.6  ALBUMIN 3.6   No results for input(s): LIPASE, AMYLASE in the last 168 hours. No results for input(s): AMMONIA in the last 168 hours. Coagulation Profile: Recent Labs  Lab 03/02/18 1729  INR 1.73   Cardiac Enzymes: No results for input(s): CKTOTAL, CKMB, CKMBINDEX, TROPONINI in the last 168 hours. BNP (last 3 results) No results for input(s): PROBNP in the last 8760 hours. HbA1C: No results for input(s): HGBA1C in the last 72 hours. CBG: Recent Labs  Lab 03/02/18 1725  GLUCAP 112*   Lipid Profile: No results for input(s): CHOL, HDL, LDLCALC, TRIG, CHOLHDL, LDLDIRECT in the last 72 hours. Thyroid Function Tests: No results for input(s): TSH, T4TOTAL, FREET4, T3FREE, THYROIDAB in the last 72 hours. Anemia Panel: No results for input(s): VITAMINB12, FOLATE, FERRITIN, TIBC, IRON, RETICCTPCT in the last 72 hours. Urine analysis:  Component Value Date/Time   COLORURINE YELLOW 03/02/2018 1729   APPEARANCEUR HAZY (A) 03/02/2018 1729   LABSPEC 1.023 03/02/2018 1729   PHURINE 8.0 03/02/2018 1729   GLUCOSEU NEGATIVE 03/02/2018 1729   HGBUR NEGATIVE 03/02/2018 1729   BILIRUBINUR NEGATIVE 03/02/2018 1729   KETONESUR NEGATIVE 03/02/2018 1729   PROTEINUR NEGATIVE 03/02/2018 1729   UROBILINOGEN 0.2 07/28/2008 1156   NITRITE NEGATIVE 03/02/2018 1729   LEUKOCYTESUR NEGATIVE 03/02/2018 1729   Sepsis Labs: @LABRCNTIP (procalcitonin:4,lacticidven:4)  )No results found for this or any previous visit (from the past 240 hour(s)).    Radiology Studies: Dg Chest Port 1 View  Result Date: 03/08/2018 CLINICAL DATA:  Developing cough EXAM: PORTABLE CHEST 1 VIEW COMPARISON:  10/24/17 FINDINGS: Cardiac shadow is within normal limits. Aortic calcifications are again seen. The lungs are well aerated bilaterally. Minimal left basilar atelectasis is seen. No significant  pulmonary edema is noted. No bony abnormality is seen. IMPRESSION: Mild left basilar atelectasis. Electronically Signed   By: Inez Catalina M.D.   On: 03/08/2018 09:37     Scheduled Meds: . aspirin  300 mg Rectal Daily   Or  . aspirin  325 mg Oral Daily  . atorvastatin  80 mg Oral QHS  . diltiazem  30 mg Oral Q8H  . furosemide  10 mg Intravenous Once  . heparin  5,000 Units Subcutaneous Q8H  . metoprolol tartrate  75 mg Oral BID  . pantoprazole  40 mg Oral q morning - 10a   Continuous Infusions:    LOS: 6 days   Time Spent in minutes   30 minutes  Nykolas Bacallao D.O. on 03/08/2018 at 10:04 AM  Between 7am to 7pm - Please see pager noted on [email or web address]  After 7pm go to [email or web address]  And look for the night coverage person covering for me after hours  [facility] Office  [phone]

## 2018-03-09 ENCOUNTER — Encounter: Payer: Self-pay | Admitting: Internal Medicine

## 2018-03-09 ENCOUNTER — Other Ambulatory Visit: Payer: Self-pay

## 2018-03-09 ENCOUNTER — Encounter (HOSPITAL_COMMUNITY)
Admission: RE | Admit: 2018-03-09 | Discharge: 2018-03-09 | Disposition: A | Discharge status: Home / Self Care | Payer: Medicare Other | Source: Skilled Nursing Facility | Attending: Internal Medicine | Admitting: Internal Medicine

## 2018-03-09 DIAGNOSIS — I63511 Cerebral infarction due to unspecified occlusion or stenosis of right middle cerebral artery: Secondary | ICD-10-CM | POA: Insufficient documentation

## 2018-03-09 MED ORDER — DILTIAZEM HCL 30 MG PO TABS
30.0000 mg | ORAL_TABLET | Freq: Four times a day (QID) | ORAL | Status: DC
  Administered 2018-03-09 – 2018-03-11 (×6): 30 mg via ORAL
  Filled 2018-03-09 (×7): qty 1

## 2018-03-09 MED ORDER — MAGNESIUM SULFATE 2 GM/50ML IV SOLN
2.0000 g | Freq: Once | INTRAVENOUS | Status: AC
  Administered 2018-03-09: 2 g via INTRAVENOUS
  Filled 2018-03-09: qty 50

## 2018-03-09 MED ORDER — METOPROLOL TARTRATE 50 MG PO TABS
100.0000 mg | ORAL_TABLET | Freq: Two times a day (BID) | ORAL | Status: DC
  Administered 2018-03-09 – 2018-03-13 (×9): 100 mg via ORAL
  Filled 2018-03-09 (×9): qty 2

## 2018-03-09 NOTE — Progress Notes (Signed)
Occupational Therapy Treatment Patient Details Name: Brenda Hall MRN: [id number] DOB: 1930/10/09 Today's Date: 03/09/2018    History of present illness Pt is a 84 y/o female with PMH significant for atrial fibrillation on Xarelto, CAD, history of CVA, hypertension, CKD stage III, and polymyalgia rheumatica admitted with L sided weakness, facial droop and slurred speech.  Hx of R eye surgery 4 days ago and R eye drooping since. CT reveals acute/early subacute infarct in the posterior R MCA territory. Admitted for further stroke workup.     OT comments  Patient continues to present with impaired cognition, vision, balance, and activity tolerance, as well as L neglect and L hemiparesis.  Patient requires max cueing throughout session to attend to tasks with poor active participation due to limited sustained attention.  Cueing for visual scanning and attending to L side of body and enviornment, but noted improvements in static sitting balance only requiring min assist to min guard assist at times.  Continue to recommend SNF rehab at dc.    Follow Up Recommendations  SNF;Supervision/Assistance - 24 hour    Equipment Recommendations  Other (comment)(TBD at next venue of care)    Recommendations for Other Services      Precautions / Restrictions Precautions Precautions: Fall Restrictions Weight Bearing Restrictions: No       Mobility Bed Mobility Overal bed mobility: Needs Assistance Bed Mobility: Rolling;Sidelying to Sit Rolling: Total assist;+2 for physical assistance Sidelying to sit: Total assist;+2 for physical assistance       General bed mobility comments: total assist +2 due to poor attention and participation in tasks today, patient requires constant redirection to engage   Transfers Overall transfer level: Needs assistance   Transfers: Lateral/Scoot Transfers          Lateral/Scoot Transfers: Total assist;+2 safety/equipment General transfer comment: total  assist due to decreased attention to task, patient with poor tolerance, impaired balance and limited sustained attention to actively assist with transfer     Balance Overall balance assessment: Needs assistance Sitting-balance support: No upper extremity supported;Single extremity supported Sitting balance-Leahy Scale: Fair Sitting balance - Comments: L lateral lean but not pushing today, min guard to min assist to maintain static sitting balance Postural control: Left lateral lean                                 ADL either performed or assessed with clinical judgement   ADL Overall ADL's : Needs assistance/impaired     Grooming: Moderate assistance;Sitting           Upper Body Dressing : Maximal assistance;Sitting Upper Body Dressing Details (indicate cue type and reason): seated in recliner to don sweater, edu on dressing L UE first; limited by poor attention to task      Toilet Transfer: Total assistance;+2 for physical assistance Toilet Transfer Details (indicate cue type and reason): lateral scoot simulated to recliner, constant cueing for sequencing and safety         Functional mobility during ADLs: Total assistance;+2 for physical assistance General ADL Comments: Patient signifincantly limited by attention to task, limited participation in ADLs and mobility due to preseveration on various topics througout session      Vision   Additional Comments: eyes maintained open throughout session but patient continues to require cueing for visual scanning towards L side   Perception     Praxis      Cognition Arousal/Alertness: Awake/alert Behavior During Therapy:  Restless;Anxious Overall Cognitive Status: Impaired/Different from baseline Area of Impairment: Attention;Memory;Following commands;Safety/judgement;Awareness;Problem solving                   Current Attention Level: Sustained Memory: Decreased recall of precautions;Decreased short-term  memory Following Commands: Follows one step commands inconsistently;Follows one step commands with increased time Safety/Judgement: Decreased awareness of safety;Decreased awareness of deficits Awareness: Emergent Problem Solving: Slow processing;Decreased initiation;Difficulty sequencing;Requires verbal cues;Requires tactile cues General Comments: easily distracted, L sided neglect, slow procressing         Exercises Exercises: Other exercises Other Exercises Other Exercises: Completed supine head/neck ROM, x 5 reps with min assist for lateral rotation.    Shoulder Instructions       General Comments L neglect, L UE edema, able to assist with mgmt of L UE when cueing; improving awareness of L sided absent sensation.     Pertinent Vitals/ Pain       Pain Assessment: Faces Faces Pain Scale: Hurts little more Pain Location: neck and L upper arm Pain Descriptors / Indicators: Discomfort;Sore Pain Intervention(s): Limited activity within patient's tolerance;Repositioned  Home Living                                          Prior Functioning/Environment              Frequency  Min 3X/week        Progress Toward Goals  OT Goals(current goals can now be found in the care plan section)  Progress towards OT goals: Progressing toward goals  Acute Rehab OT Goals Patient Stated Goal: to be able to have a cold drink OT Goal Formulation: With patient Time For Goal Achievement: 03/17/18 Potential to Achieve Goals: Good  Plan Discharge plan remains appropriate;Frequency remains appropriate    Co-evaluation    PT/OT/SLP Co-Evaluation/Treatment: Yes Reason for Co-Treatment: Complexity of the patient's impairments (multi-system involvement);Necessary to address cognition/behavior during functional activity;To address functional/ADL transfers;For patient/therapist safety   OT goals addressed during session: ADL's and self-care;Other  (comment);Strengthening/ROM(moblity)      AM-PAC PT "6 Clicks" Daily Activity     Outcome Measure   Help from another person eating meals?: Total Help from another person taking care of personal grooming?: A Lot Help from another person toileting, which includes using toliet, bedpan, or urinal?: Total Help from another person bathing (including washing, rinsing, drying)?: Total Help from another person to put on and taking off regular upper body clothing?: Total Help from another person to put on and taking off regular lower body clothing?: Total 6 Click Score: 7    End of Session    OT Visit Diagnosis: Other abnormalities of gait and mobility (R26.89);Muscle weakness (generalized) (M62.81);Other symptoms and signs involving cognitive function;Low vision, both eyes (H54.2);Hemiplegia and hemiparesis;Pain Hemiplegia - Right/Left: Left Hemiplegia - dominant/non-dominant: Non-Dominant Hemiplegia - caused by: Cerebral infarction Pain - part of body: Arm(throat)   Activity Tolerance Patient tolerated treatment well   Patient Left in chair;with call bell/phone within reach;with family/visitor present   Nurse Communication Mobility status        Time: 7628-3151 OT Time Calculation (min): 19 min  Charges: OT General Charges $OT Visit: 1 Visit OT Treatments $Self Care/Home Management : 8-22 mins  Delight Stare, [place] Pager [phone] Office [phone]    Delight Stare 03/09/2018, 12:27 PM

## 2018-03-09 NOTE — Progress Notes (Signed)
Received information from [facility] that d/c is supported and that patient has until 12 pm tomorrow under her Medicare to remain at the hospital. Judithann Graves also informed patient's son. Son requesting d/c to [facility] today. CM updated CSW and MD.

## 2018-03-09 NOTE — Consult Note (Addendum)
Cardiology Consultation:   Patient ID: Brenda Hall MRN: [id number]; DOB: 05-16-1930  Admit date: 03/02/2018 Date of Consult: 03/09/2018  Primary Care Provider: [name], MD Primary Cardiologist: Carlyle Dolly, MD  Primary Electrophysiologist:  None   Patient Profile:   Brenda Hall is a 84 y.o. female with past medical history of CAD (s/p NSTEMI in 2007 with BMS to LCx), PAF (on Xarelto), prior h/o CVA, HTN, HLD, Stage 3 CKD and polymyalgia rheumatica who is being seen today for the evaluation of atrial fibrillation in the setting of acute CVA, at the request of Dr. Ree Kida, Internal Medicine.   History of Present Illness:   Brenda Hall is followed by Dr. Azucena Kuba w/ a PMH as outlined above. She initially presented to the [facility] ([place] ED on 03/02/18 with CC of acute left sided weakness, facial droop and slurred speech. Stat head CT showed acute/ early subacute infarct in the posterior R MCA territory. CT of the neck showed <50% bilateral ICA stenosis. BP in the ED was elevated at 165/96. EKG showed sinus tach at 132 bpm w/ PACs and inferior lateral ST depressions. Afib on tele. There was unclear time of onset of symptoms, thus not a candidate for TPA. Pt transferred to [facility] for further care. Neurology consulted on arrival. Per neurology, the location would suggest embolic phenomenon, likely due to known atrial fibrillation. Given the size of her infarct, neurology favors holding anticoagulation at this time. Pt is currently being treated with high dose ASA, high intensity statin w/ atorvastatin 80, metoprolol and Cardizem for HR and BP control. BP is controlled at 103/84. She remains in atrial fibrillation on tele with variable rates, c/w tachy brady, alternating between afib w/ RVR with rates in the 120s to afib w/ SVR and pauses up to 1.7 sec. 2D echo shows normal LVEF at 65-70% and G1DD. Lipid panel w/ LDL at 65 mg/dL. Hgb A1.c 5.5.   Per neurology notes, the  patient has reported full compliance w/ Xarelto however pt has been taking Xarelto differently than what is recommended. She has been taking Xarelto prior to going to bed, taking only with a few sips of water and not with a full meal. ? Decreased absorption. Neurology has mentioned to consider changing to Eliquis once she is cleared to resume anticoagulation.   She is currently asymptomatic w/ her afib. Son currently present at bedside.    Past Medical History:  Diagnosis Date  . Anemia   . CAD in native artery    a. NSTEMI 12/2005 s/p BMS to Cx, prior normal LVEF.  Marland Kitchen Cancer ([place]) 1988 and  2009   BCC nose and  Melanoma back  . Cancer of skin Jan. 2014   [facility] Forehead and Nose  . Carotid stenosis    mild  . Chronic back pain   . CKD (chronic kidney disease), stage III ([place])   . Coronary atherosclerosis of unspecified type of vessel, native or graft   . DJD (degenerative joint disease) of knee   . History of TIA (transient ischemic attack)   . HTN (hypertension)   . IBS (irritable bowel syndrome)   . Myocardial infarction [facility])    5 yrs ago     dr wall  cardiac  . Osteoporosis   . Other diseases of lung, not elsewhere classified    lung nodule  . Polymyalgia rheumatica ([place])   . Shingles 01/2016-02/2016  . Stroke [facility])     Past Surgical History:  Procedure Laterality  Date  . ABDOMINAL HYSTERECTOMY  1977   partial  . [place]  . CARDIAC CATHETERIZATION    . COLONOSCOPY N/A 01/27/2013   Procedure: COLONOSCOPY;  Surgeon: Rogene Houston, MD;  Location: [facility] SUITE;  Service: Endoscopy;  Laterality: N/A;  1030  . EYE SURGERY Bilateral 04/2017  . EYE SURGERY Right 09/23/2017  . IR KYPHO LUMBAR INC FX REDUCE BONE BX UNI/BIL CANNULATION INC/IMAGING  09/30/2017  . IR VERTEBROPLASTY CERV/THOR BX INC UNI/BIL INC/INJECT/IMAGING  09/05/2017  . JOINT REPLACEMENT  2001 and  2011   6 knee surgeries   bil replacement  . KNEE ARTHROPLASTY    . KNEE ARTHROSCOPY    . LUMBAR  LAMINECTOMY    . PR VEIN BYPASS GRAFT,AORTO-FEM-POP  2010  . removal of melanoma from back  2007  . SPINE SURGERY  2013   Broken back: fusion surgery  . tonisllectomy  1944  . total knee anthroplasty    . VERTEBROPLASTY  07/11/2011   Procedure: VERTEBROPLASTY;  Surgeon: Faythe Ghee, MD;  Location: [place] NEURO ORS;  Service: Neurosurgery;  Laterality: N/A;  Vertebroplasty of Lumbar Two    Inpatient Medications: Scheduled Meds: . aspirin  300 mg Rectal Daily   Or  . aspirin  325 mg Oral Daily  . atorvastatin  80 mg Oral QHS  . diltiazem  30 mg Oral Q8H  . heparin  5,000 Units Subcutaneous Q8H  . metoprolol tartrate  75 mg Oral BID  . pantoprazole  40 mg Oral q morning - 10a   Continuous Infusions:  PRN Meds: acetaminophen **OR** acetaminophen (TYLENOL) oral liquid 160 mg/5 mL **OR** acetaminophen, diphenhydrAMINE, hydrALAZINE, metoprolol tartrate, phenol, RESOURCE THICKENUP CLEAR, senna-docusate, traMADol  Allergies:    Allergies  Allergen Reactions  . Epinephrine Other (See Comments)    During dental procedure-caused facial swelling at injection site. Pt has had epinephrine since then for nose and facial surgery and did fine with it per pt.  . Clopidogrel Bisulfate Itching and Rash    Plavix  . Oxycodone     Hallucinations. Pt states that she can tolerate hydrocodone    Social History:   Social History   Socioeconomic History  . Marital status: Widowed    Spouse name: Not on file  . Number of children: Not on file  . Years of education: Not on file  . Highest education level: Not on file  Occupational History  . Not on file  Social Needs  . Financial resource strain: Not on file  . Food insecurity:    Worry: Not on file    Inability: Not on file  . Transportation needs:    Medical: Not on file    Non-medical: Not on file  Tobacco Use  . Smoking status: Never Smoker  . Smokeless tobacco: Never Used  . Tobacco comment: tobacco use - no  Substance and Sexual  Activity  . Alcohol use: Yes    Alcohol/week: 0.0 standard drinks    Comment: occasionally a glass of wine or margarita per pt  . Drug use: No  . Sexual activity: Not on file  Lifestyle  . Physical activity:    Days per week: Not on file    Minutes per session: Not on file  . Stress: Not on file  Relationships  . Social connections:    Talks on phone: Not on file    Gets together: Not on file    Attends religious service: Not on file  Active member of club or organization: Not on file    Attends meetings of clubs or organizations: Not on file    Relationship status: Not on file  . Intimate partner violence:    Fear of current or ex partner: Not on file    Emotionally abused: Not on file    Physically abused: Not on file    Forced sexual activity: Not on file  Other Topics Concern  . Not on file  Social History Narrative   Married, retired. 3 children. Regular exercise -yes; hobbies = swimming.    Family History:    Family History  Problem Relation Age of Onset  . Heart disease Father   . Hypertension Father   . Heart attack Father   . Cancer Mother        bladder   . Hypertension Daughter   . Atrial fibrillation Daughter   . Hypertension Son   . Colon cancer Neg Hx      ROS:  Please see the history of present illness.   All other ROS reviewed and negative.     Physical Exam/Data:   Vitals:   03/08/18 2312 03/09/18 0305 03/09/18 0752 03/09/18 1128  BP: 110/74 124/60 (!) 132/101 103/84  Pulse: 70 98 80 82  Resp: 18 16 18 18   Temp: 98.2 F (36.8 C) 98.4 F (36.9 C) (!) 97.5 F (36.4 C) 97.9 F (36.6 C)  TempSrc: Oral Oral Axillary Axillary  SpO2: 96% 99% 100% 97%  Weight:      Height:        Intake/Output Summary (Last 24 hours) at 03/09/2018 1347 Last data filed at 03/09/2018 0200 Gross per 24 hour  Intake 180 ml  Output 1050 ml  Net -870 ml   Filed Weights   03/02/18 1724  Weight: 69.6 kg   Body mass index is 25.53 kg/m.  General:   Elderly WF in no acute distress HEENT: normal Lymph: no adenopathy Neck: no JVD Endocrine:  No thryomegaly Vascular: No carotid bruits; FA pulses 2+ bilaterally without bruits  Cardiac:  Irregularly irregular rhythm, regular rate Lungs:  clear to auscultation bilaterally, no wheezing, rhonchi or rales  Abd: soft, nontender, no hepatomegaly  Ext: no edema Musculoskeletal:  No deformities, BUE and BLE strength normal and equal Skin: warm and dry  Neuro:  Left sided extremity weakness Psych:  Normal affect   EKG:  The EKG was personally reviewed and demonstrates:  Atrial fibrillation  Telemetry:  Telemetry was personally reviewed and demonstrates:  atrial fibrillation on tele with variable rates, c/w tachy brady, alternating between afib w/ RVR with rates in the 120s to afib w/ SVR and pauses up to 1.7 sec  Relevant CV Studies:  2D Echo 03/04/18 Study Conclusions  - Left ventricle: The cavity size was normal. Wall thickness was   increased in a pattern of mild LVH. There was mild focal basal   hypertrophy of the septum. Systolic function was vigorous. The   estimated ejection fraction was in the range of 65% to 70%. Wall   motion was normal; there were no regional wall motion   abnormalities. Doppler parameters are consistent with abnormal   left ventricular relaxation (grade 1 diastolic dysfunction). - Pulmonary arteries: Systolic pressure was moderately to severely   increased. PA peak pressure: 60 mm Hg (S).  Impressions:  - Vigorous LV systolic function; mild LVH; mild diastolic   dysfunction; sclerotic aortic valve; mild TR; moderate to severe   pulmonary hypertension.   Laboratory  Data:  Chemistry Recent Labs  Lab 03/06/18 0423 03/07/18 0709 03/08/18 1019  NA 140 141 137  K 3.7 4.0 4.0  CL 110 115* 111  CO2 18* 19* 19*  GLUCOSE 109* 102* 106*  BUN 15 18 17   CREATININE 1.00 0.97 0.86  CALCIUM 8.8* 8.4* 8.5*  GFRNONAA 50* 52* 60*  GFRAA 58* >60 >60    ANIONGAP 12 7 7     Recent Labs  Lab 03/02/18 1729  PROT 7.6  ALBUMIN 3.6  AST 20  ALT 12  ALKPHOS 83  BILITOT 0.9   Hematology Recent Labs  Lab 03/02/18 1729  03/04/18 0428 03/06/18 0423 03/08/18 1019  WBC 7.7  --  6.9 9.3  --   RBC 4.24  --  4.19 4.30  --   HGB 11.3*   < > 11.3* 11.3* 11.0*  HCT 38.0   < > 37.0 37.7 37.5  MCV 89.6  --  88.3 87.7  --   MCH 26.7  --  27.0 26.3  --   MCHC 29.7*  --  30.5 30.0  --   RDW 15.6*  --  15.3 15.6*  --   PLT 381  --  345 354  --    < > = values in this interval not displayed.   Cardiac EnzymesNo results for input(s): TROPONINI in the last 168 hours.  Recent Labs  Lab 03/02/18 1728  TROPIPOC 0.00    BNPNo results for input(s): BNP, PROBNP in the last 168 hours.  DDimer No results for input(s): DDIMER in the last 168 hours.  Radiology/Studies:  Dg Chest Port 1 View  Result Date: 03/08/2018 CLINICAL DATA:  Developing cough EXAM: PORTABLE CHEST 1 VIEW COMPARISON:  10/24/17 FINDINGS: Cardiac shadow is within normal limits. Aortic calcifications are again seen. The lungs are well aerated bilaterally. Minimal left basilar atelectasis is seen. No significant pulmonary edema is noted. No bony abnormality is seen. IMPRESSION: Mild left basilar atelectasis. Electronically Signed   By: Inez Catalina M.D.   On: 03/08/2018 09:37    Assessment and Plan:   Brenda Hall is a 84 y.o. female with past medical history of CAD (s/p NSTEMI in 2007 with BMS to LCx), PAF (on Xarelto), prior h/o CVA, HTN, HLD, Stage 3 CKD and polymyalgia rheumatica who is being seen today for the evaluation of atrial fibrillation in the setting of acute CVA, at the request of Dr. Ree Kida, Internal Medicine.   1. Acute CVA: right MCA territory. No TPA given unclear time of symptom onset. CT of neck showed < 50% bilateral ICA stenosis. Per neurology, based on the location, suspect embolic phenomenon, likely due to known atrial fibrillation. While pt reports full  compliance with Xarelto,  She has been taking Xarelto differently than what is recommended. She has been taking Xarelto prior to going to bed, taking only with a few sips of water and not with a full meal. ? Decreased absorption. Neurology has mentioned to consider changing to Eliquis once she is cleared to resume anticoagulation. Given the size of her stroke, neurology has recommended anticoagulation be held at this time. Resume once neurology has cleared. Would agree to switching to Eliquis. Further management per neurology, ASA, statin and BP control.   2. Atrial Fibrillation:  She remains in atrial fibrillation on tele with variable rates, c/w tachy brady, alternating between afib w/ RVR with rates in the 120s to afib w/ SVR and pauses up to 1.7 sec. She was only on 1 AV nodal  blocking agent prior to admit, metoprolol succinate 75 mg daily. This has been changed to metoprolol tartrate 75 mg BID. Given her RVR, low dose Cardizem was added yesterday, 30 mg Q8HR. Her longest pause on tele has been 1.7 sec. We will try to titrate her rate control agents a bit and monitor response closely. Will titrate metoprolol to 100 mg BID and Cardizem 30 mg Q6H. Continue to monitor. If recurrent pauses/ significant bradycardia, she may need EP evaluation for tachy brady/possible PPM to allow for more aggressive use of AV nodal blocking agents to control her rapid ventricular rates. If her pauses do not lengthen any further during her hospital stay, and if decide to transition her to long acting Cardizem, then we can arrange for outpatient monitor post discharge for further surveillance. As outlined above, anticoagulation is on hold due to recent CVA. We will resume once cleared by neurology. Given CVA occurred on Xarelto, recommend switching to Eliquis. Based on dosing criteria using age, weight and renal function, would recommend full dose 5 mg BID.   3. CAD: s/p NSTEMI in 2007 with BMS to LCx. Denies CP. Continue medical  therapy w/ ASA, statin and  blocker.   4. HTN: controlled on current regimen.   5. HLD: LDL controlled at 65 mg/dL. Continue atorvastatin.   For questions or updates, please contact [place] Please consult [email or web address] for contact info under     Signed, Lyda Jester, PA-C  03/09/2018 1:47 PM

## 2018-03-09 NOTE — Progress Notes (Signed)
Physical Therapy Treatment Patient Details Name: Brenda Hall MRN: [id number] DOB: [birth year] Today's Date: 03/09/2018    History of Present Illness Pt is a 84 y/o female with PMH significant for atrial fibrillation on Xarelto, CAD, history of CVA, hypertension, CKD stage III, and polymyalgia rheumatica admitted with L sided weakness, facial droop and slurred speech.  Hx of R eye surgery 4 days ago and R eye drooping since. CT reveals acute/early subacute infarct in the posterior R MCA territory. Admitted for further stroke workup.      PT Comments    Pt received in bed agreeable to participation in therapy. Cues required throughout session to remain on task. She required +2 total assist bed mobility and lateral/scoot transfer bed to recliner. Pt positioned in recliner with feet elevated and LUE supported on pillow.    Follow Up Recommendations  SNF;Supervision/Assistance - 24 hour     Equipment Recommendations  None recommended by PT    Recommendations for Other Services       Precautions / Restrictions Precautions Precautions: Fall Restrictions Weight Bearing Restrictions: No    Mobility  Bed Mobility Overal bed mobility: Needs Assistance Bed Mobility: Rolling;Sidelying to Sit Rolling: Total assist;+2 for physical assistance Sidelying to sit: Total assist;+2 for physical assistance       General bed mobility comments: +2 total due to poor attention and participation. Pt requires redirection to engage  Transfers Overall transfer level: Needs assistance   Transfers: Lateral/Scoot Transfers          Lateral/Scoot Transfers: Total assist;+2 physical assistance General transfer comment: scoot transfer bed to recliner toward right  Ambulation/Gait             General Gait Details: unable   Stairs             Wheelchair Mobility    Modified Rankin (Stroke Patients Only) Modified Rankin (Stroke Patients Only) Pre-Morbid Rankin Score: Slight  disability Modified Rankin: Severe disability     Balance Overall balance assessment: Needs assistance Sitting-balance support: No upper extremity supported;Single extremity supported Sitting balance-Leahy Scale: Fair Sitting balance - Comments: min guard to min assist for static sitting EOB Postural control: Left lateral lean                                  Cognition Arousal/Alertness: Awake/alert Behavior During Therapy: Restless;Anxious Overall Cognitive Status: Impaired/Different from baseline Area of Impairment: Attention;Memory;Following commands;Safety/judgement;Awareness;Problem solving                   Current Attention Level: Sustained Memory: Decreased recall of precautions;Decreased short-term memory Following Commands: Follows one step commands inconsistently;Follows one step commands with increased time Safety/Judgement: Decreased awareness of safety;Decreased awareness of deficits Awareness: Emergent Problem Solving: Slow processing;Decreased initiation;Difficulty sequencing;Requires verbal cues;Requires tactile cues General Comments: easily distracted, L sided neglect, slow procressing       Exercises Other Exercises Other Exercises: Completed supine head/neck ROM, x 5 reps with min assist for lateral rotation.     General Comments General comments (skin integrity, edema, etc.): L neglect, L UE edema      Pertinent Vitals/Pain Pain Assessment: No/denies pain Faces Pain Scale: Hurts little more Pain Location: neck and L upper arm Pain Descriptors / Indicators: Discomfort;Sore Pain Intervention(s): Limited activity within patient's tolerance;Monitored during session;Repositioned    Home Living  Prior Function            PT Goals (current goals can now be found in the care plan section) Acute Rehab PT Goals Patient Stated Goal: to be able to have a cold drink PT Goal Formulation: With patient Time  For Goal Achievement: 03/17/18 Potential to Achieve Goals: Good Progress towards PT goals: Progressing toward goals    Frequency    Min 3X/week      PT Plan Current plan remains appropriate    Co-evaluation PT/OT/SLP Co-Evaluation/Treatment: Yes Reason for Co-Treatment: Complexity of the patient's impairments (multi-system involvement);Necessary to address cognition/behavior during functional activity;For patient/therapist safety;To address functional/ADL transfers PT goals addressed during session: Mobility/safety with mobility OT goals addressed during session: ADL's and self-care;Other (comment);Strengthening/ROM(moblity)      AM-PAC PT "6 Clicks" Daily Activity  Outcome Measure  Difficulty turning over in bed (including adjusting bedclothes, sheets and blankets)?: Unable Difficulty moving from lying on back to sitting on the side of the bed? : Unable Difficulty sitting down on and standing up from a chair with arms (e.g., wheelchair, bedside commode, etc,.)?: Unable Help needed moving to and from a bed to chair (including a wheelchair)?: Total Help needed walking in hospital room?: Total Help needed climbing 3-5 steps with a railing? : Total 6 Click Score: 6    End of Session   Activity Tolerance: Patient tolerated treatment well Patient left: in chair;with call bell/phone within reach Nurse Communication: Mobility status PT Visit Diagnosis: Other abnormalities of gait and mobility (R26.89);Muscle weakness (generalized) (M62.81);Hemiplegia and hemiparesis;Pain Hemiplegia - Right/Left: Left Hemiplegia - dominant/non-dominant: Non-dominant Hemiplegia - caused by: Cerebral infarction     Time: 2025-4270 PT Time Calculation (min) (ACUTE ONLY): 24 min  Charges:  $Therapeutic Activity: 8-22 mins                     Lorrin Goodell, PT  Office # [phone] Pager [phone]    Lorriane Shire 03/09/2018, 12:50 PM

## 2018-03-09 NOTE — Progress Notes (Signed)
PROGRESS NOTE    Brenda Hall  QPY:[id number] DOB: 23-Feb-1931 DOA: 03/02/2018 PCP: [name], MD   Brief Narrative:  HPI On 03/02/2018 by Dr. Zada Finders Ranelle H Hall is a 84 y.o. female with medical history significant for atrial fibrillation on Xarelto, CAD, history of CVA, hypertension, CKD stage III, and polymyalgia rheumatica who presents to the ED with left-sided weakness, facial droop, and slurred speech.  Time last well-known was around 9 AM.  Patient is able to provide most of recent history.  Her son and daughter-in-law are present during the time of my evaluation and provide additional history.  Patient states she has recently been dealing with a upper respiratory infection which has not been improving with oral azithromycin.  She called her PCP today and was prescribed Augmentin.  Her grandson visited her at home and saw her sitting down in the chair.  Around 4 PM she attempted to get up but was having difficulty lifting her left leg off the floor.  She was also having slurred speech and she was noted to have drooping at the left side of her face.  She says she had eye surgery on her right eye 4 days ago with right eye drooping since that time.  She has been on Xarelto for atrial fibrillation and says she has been taking it every night and has not seen any obvious bleeding.  She also reports recently completing a slow taper off of oral prednisone for treatment of her polymyalgia rheumatica.  She denies any chest pain, palpitations, dyspnea, abdominal pain, or dysuria.  Interim history Admitted for acute CVA.  Neurology consulted and appreciated, work-up completed. Patient was stable for discharge on 11/15/209, however family is appealing discharge. Assessment & Plan   Acute CVA  -presented with left-sided weakness, facial droop and dysarthria -CT head showed changes consistent with acute or early subacute infarction and posterior right MCA territory, no  hemorrhage -CTA head and neck cerebral perfusion study showed short segment occlusion of right MCA proximal M2 -MRI large right MCA territory infarct without hemorrhage or mass-effect. -Echocardiogram EF 65 to 12%, grade 1 diastolic dysfunction.  PA peak pressure of 60 mmHg.  Sclerotic aortic valve, mild TR, moderate to severe pulmonary hypertension. -Hemoglobin A1c 5.5, LDL 65 -Neurology consulted and appreciated, recommended switching Xarelto to Eliquis when patient is able to swallow.  Continue aspirin for now. -Speech recommending dysphagia 1 diet -PT recommended SNF -OT recommended CIR  Paroxysmal atrial fibrillation/atrial fibrillation with RVR -continue metoprolol and cardizem -Cardiology consulted and appreciated, commended increasing metoprolol to 100 mg twice daily as well as Cardizem 30 mg every 6 hours and monitoring through the night  Essential hypertension -Amlodipine held for permissive hypertension -back on metoprolol for AF   CAD -Continue aspirin  CKD stage III -Creatinine appears to be at baseline, continue to monitor BMP  PMR -Patient follows with Dr. Estanislado Pandy -She has been tapered off of prednisone and started on abaloparatide- which is held for now  Pain -Complains of back pain and states she had back surgery in the past -Continue tramadol PRN  Cough -obtained CXR, reviewed and unremarkable for infection -patient states she was sick 2 weeks prior to admission and had a sore throat  LUE edema -likely due to IV infiltration and IVF -discontinue IV fluid -elevated arm  Rash -likely heat related, on abdomen/back -no pruritis, hives  DVT Prophylaxis  Heparin  Code Status: Full  Family Communication: None at bedside  Disposition Plan: Admitted. Workup  completed. Patient was discharged on 11/15, however patient's family appealing.  Consultants Neurology  Procedures  Echocardiogram  Antibiotics   Anti-infectives (From admission, onward)    None      Subjective:   Brenda Hall seen and examined today.  No complaints this morning feeling 84 thirsty.  Denies chest pain, shortness breath, abdominal pain, nausea or vomiting, diarrhea or constipation.  Objective:   Vitals:   03/08/18 2312 03/09/18 0305 03/09/18 0752 03/09/18 1128  BP: 110/74 124/60 (!) 132/101 103/84  Pulse: 70 98 80 82  Resp: 18 16 18 18   Temp: 98.2 F (36.8 C) 98.4 F (36.9 C) (!) 97.5 F (36.4 C) 97.9 F (36.6 C)  TempSrc: Oral Oral Axillary Axillary  SpO2: 96% 99% 100% 97%  Weight:      Height:        Intake/Output Summary (Last 24 hours) at 03/09/2018 1631 Last data filed at 03/09/2018 0200 Gross per 24 hour  Intake 180 ml  Output 1050 ml  Net -870 ml   Filed Weights   03/02/18 1724  Weight: 69.6 kg   Exam  General: Well developed, elderly, chronically ill-appearing,NAD, appears stated age  40: NCAT,mucous membranes moist.  Cardiovascular: S1 S2 auscultated, irregularly irregular  Respiratory: Clear to auscultation bilaterally with equal chest rise  Abdomen: Soft, nontender, nondistended, + bowel sounds  Extremities: warm dry without cyanosis clubbing or edema  Neuro: AAOx3, left sided weakness  Psych: Normal affect and demeanor   Data Reviewed: I have personally reviewed following labs and imaging studies  CBC: Recent Labs  Lab 03/02/18 1729 03/02/18 1730 03/04/18 0428 03/06/18 0423 03/08/18 1019  WBC 7.7  --  6.9 9.3  --   NEUTROABS 5.2  --   --   --   --   HGB 11.3* 12.9 11.3* 11.3* 11.0*  HCT 38.0 38.0 37.0 37.7 37.5  MCV 89.6  --  88.3 87.7  --   PLT 381  --  345 354  --    Basic Metabolic Panel: Recent Labs  Lab 03/04/18 0428 03/05/18 0407 03/06/18 0423 03/07/18 0709 03/08/18 1019  NA 137 139 140 141 137  K 4.4 4.2 3.7 4.0 4.0  CL 106 107 110 115* 111  CO2 20* 18* 18* 19* 19*  GLUCOSE 83 114* 109* 102* 106*  BUN 15 17 15 18 17   CREATININE 1.19* 0.99 1.00 0.97 0.86  CALCIUM 8.4* 8.5* 8.8*  8.4* 8.5*  MG  --   --   --   --  1.7   GFR: Estimated Creatinine Clearance: 46.8 mL/min (by C-G formula based on SCr of 0.86 mg/dL). Liver Function Tests: Recent Labs  Lab 03/02/18 1729  AST 20  ALT 12  ALKPHOS 83  BILITOT 0.9  PROT 7.6  ALBUMIN 3.6   No results for input(s): LIPASE, AMYLASE in the last 168 hours. No results for input(s): AMMONIA in the last 168 hours. Coagulation Profile: Recent Labs  Lab 03/02/18 1729  INR 1.73   Cardiac Enzymes: No results for input(s): CKTOTAL, CKMB, CKMBINDEX, TROPONINI in the last 168 hours. BNP (last 3 results) No results for input(s): PROBNP in the last 8760 hours. HbA1C: No results for input(s): HGBA1C in the last 72 hours. CBG: Recent Labs  Lab 03/02/18 1725  GLUCAP 112*   Lipid Profile: No results for input(s): CHOL, HDL, LDLCALC, TRIG, CHOLHDL, LDLDIRECT in the last 72 hours. Thyroid Function Tests: No results for input(s): TSH, T4TOTAL, FREET4, T3FREE, THYROIDAB in the last 72 hours. Anemia Panel:  No results for input(s): VITAMINB12, FOLATE, FERRITIN, TIBC, IRON, RETICCTPCT in the last 72 hours. Urine analysis:    Component Value Date/Time   COLORURINE YELLOW 03/02/2018 1729   APPEARANCEUR HAZY (A) 03/02/2018 1729   LABSPEC 1.023 03/02/2018 1729   PHURINE 8.0 03/02/2018 1729   GLUCOSEU NEGATIVE 03/02/2018 1729   HGBUR NEGATIVE 03/02/2018 1729   BILIRUBINUR NEGATIVE 03/02/2018 1729   KETONESUR NEGATIVE 03/02/2018 1729   PROTEINUR NEGATIVE 03/02/2018 1729   UROBILINOGEN 0.2 07/28/2008 1156   NITRITE NEGATIVE 03/02/2018 1729   LEUKOCYTESUR NEGATIVE 03/02/2018 1729   Sepsis Labs: @LABRCNTIP (procalcitonin:4,lacticidven:4)  )No results found for this or any previous visit (from the past 240 hour(s)).    Radiology Studies: Dg Chest Port 1 View  Result Date: 03/08/2018 CLINICAL DATA:  Developing cough EXAM: PORTABLE CHEST 1 VIEW COMPARISON:  10/24/17 FINDINGS: Cardiac shadow is within normal limits. Aortic  calcifications are again seen. The lungs are well aerated bilaterally. Minimal left basilar atelectasis is seen. No significant pulmonary edema is noted. No bony abnormality is seen. IMPRESSION: Mild left basilar atelectasis. Electronically Signed   By: Inez Catalina M.D.   On: 03/08/2018 09:37     Scheduled Meds: . aspirin  300 mg Rectal Daily   Or  . aspirin  325 mg Oral Daily  . atorvastatin  80 mg Oral QHS  . diltiazem  30 mg Oral Q6H  . heparin  5,000 Units Subcutaneous Q8H  . metoprolol tartrate  100 mg Oral BID  . pantoprazole  40 mg Oral q morning - 10a   Continuous Infusions:    LOS: 7 days   Time Spent in minutes   30 minutes  Triston Lisanti D.O. on 03/09/2018 at 4:31 PM  Between 7am to 7pm - Please see pager noted on [email or web address]  After 7pm go to [email or web address]  And look for the night coverage person covering for me after hours  [facility] Office  [phone]

## 2018-03-09 NOTE — Progress Notes (Signed)
Provider: Veleta Miners MD  Location:    [facility] Room Number: 156/P Place of Service:  SNF ([phone])  PCP: [name], MD Patient Care Team: [name], MD as PCP - General (Family Medicine) Harl Bowie Alphonse Guild, MD as PCP - Cardiology (Cardiology)  Extended Emergency Contact Information Primary Emergency Contact: Mooney,David Address: [address]          [place], [place] [address] [place] of [place] Phone: [phone] Work Phone: [phone] Mobile Phone: [phone] Relation: Son Secondary Emergency Contact: Lovett Calender Home Phone: [phone] Relation: Other  Code Status: Full Code Goals of Care: Advanced Directive information Advanced Directives 03/09/2018  Does Patient Have a Medical Advance Directive? Yes  Type of Advance Directive (No Data)  Does patient want to make changes to medical advance directive? No - Patient declined  Would patient like information on creating a medical advance directive? -  Pre-existing out of facility DNR order (yellow form or pink MOST form) -      Chief Complaint  Patient presents with  . New Admit To SNF    New Admission Visit    HPI: Patient is a 84 y.o. female seen today for admission to  Past Medical History:  Diagnosis Date  . Anemia   . CAD in native artery    a. NSTEMI 12/2005 s/p BMS to Cx, prior normal LVEF.  Marland Kitchen Cancer ([place]) 1988 and  2009   BCC nose and  Melanoma back  . Cancer of skin Jan. 2014   [facility] Forehead and Nose  . Carotid stenosis    mild  . Chronic back pain   . CKD (chronic kidney disease), stage III ([place])   . Coronary atherosclerosis of unspecified type of vessel, native or graft   . DJD (degenerative joint disease) of knee   . History of TIA (transient ischemic attack)   . HTN (hypertension)   . IBS (irritable bowel syndrome)   . Myocardial infarction [facility])    5 yrs ago     dr wall  cardiac  . Osteoporosis   . Other diseases of lung, not elsewhere  classified    lung nodule  . Polymyalgia rheumatica ([place])   . Shingles 01/2016-02/2016  . Stroke [facility])    Past Surgical History:  Procedure Laterality Date  . ABDOMINAL HYSTERECTOMY  1977   partial  . BACK SURGERY  1982  . CARDIAC CATHETERIZATION    . COLONOSCOPY N/A 01/27/2013   Procedure: COLONOSCOPY;  Surgeon: Rogene Houston, MD;  Location[facility];  Service: Endoscopy;  Laterality: N/A;  1030  . EYE SURGERY Bilateral 04/2017  . EYE SURGERY Right 09/23/2017  . IR KYPHO LUMBAR INC FX REDUCE BONE BX UNI/BIL CANNULATION INC/IMAGING  09/30/2017  . IR VERTEBROPLASTY CERV/THOR BX INC UNI/BIL INC/INJECT/IMAGING  09/05/2017  . JOINT REPLACEMENT  2001 and  2011   6 knee surgeries   bil replacement  . KNEE ARTHROPLASTY    . KNEE ARTHROSCOPY    . LUMBAR LAMINECTOMY    . PR VEIN BYPASS GRAFT,AORTO-FEM-POP  2010  . removal of melanoma from back  2007  . SPINE SURGERY  2013   Broken back: fusion surgery  . tonisllectomy  1944  . total knee anthroplasty    . VERTEBROPLASTY  07/11/2011   Procedure: VERTEBROPLASTY;  Surgeon: Faythe Ghee, MD;  Location: [place] NEURO ORS;  Service: Neurosurgery;  Laterality: N/A;  Vertebroplasty of Lumbar Two    reports that she has never  smoked. She has never used smokeless tobacco. She reports that she drinks alcohol. She reports that she does not use drugs. Social History   Socioeconomic History  . Marital status: Widowed    Spouse name: Not on file  . Number of children: Not on file  . Years of education: Not on file  . Highest education level: Not on file  Occupational History  . Not on file  Social Needs  . Financial resource strain: Not on file  . Food insecurity:    Worry: Not on file    Inability: Not on file  . Transportation needs:    Medical: Not on file    Non-medical: Not on file  Tobacco Use  . Smoking status: Never Smoker  . Smokeless tobacco: Never Used  . Tobacco comment: tobacco use - no  Substance and Sexual Activity  .  Alcohol use: Yes    Alcohol/week: 0.0 standard drinks    Comment: occasionally a glass of wine or margarita per pt  . Drug use: No  . Sexual activity: Not on file  Lifestyle  . Physical activity:    Days per week: Not on file    Minutes per session: Not on file  . Stress: Not on file  Relationships  . Social connections:    Talks on phone: Not on file    Gets together: Not on file    Attends religious service: Not on file    Active member of club or organization: Not on file    Attends meetings of clubs or organizations: Not on file    Relationship status: Not on file  . Intimate partner violence:    Fear of current or ex partner: Not on file    Emotionally abused: Not on file    Physically abused: Not on file    Forced sexual activity: Not on file  Other Topics Concern  . Not on file  Social History Narrative   Married, retired. 3 children. Regular exercise -yes; hobbies = swimming.    Functional Status Survey:    Family History  Problem Relation Age of Onset  . Heart disease Father   . Hypertension Father   . Heart attack Father   . Cancer Mother        bladder   . Hypertension Daughter   . Atrial fibrillation Daughter   . Hypertension Son   . Colon cancer Neg Hx     Health Maintenance  Topic Date Due  . PNA vac Low Risk Adult (2 of 2 - PPSV23) 04/08/2018 (Originally 03/06/2004)  . TETANUS/TDAP  03/09/2023  . INFLUENZA VACCINE  Completed  . DEXA SCAN  Completed    Allergies  Allergen Reactions  . Epinephrine Other (See Comments)    During dental procedure-caused facial swelling at injection site. Pt has had epinephrine since then for nose and facial surgery and did fine with it per pt.  . Clopidogrel Bisulfate Itching and Rash    Plavix  . Oxycodone     Hallucinations. Pt states that she can tolerate hydrocodone    Facility-Administered Encounter Medications as of 03/09/2018  Medication  . acetaminophen (TYLENOL) tablet 650 mg   Or  . acetaminophen  (TYLENOL) solution 650 mg   Or  . acetaminophen (TYLENOL) suppository 650 mg  . aspirin suppository 300 mg   Or  . aspirin tablet 325 mg  . atorvastatin (LIPITOR) tablet 80 mg  . diltiazem (CARDIZEM) tablet 30 mg  . diphenhydrAMINE (BENADRYL) capsule 25 mg  .  heparin injection 5,000 Units  . hydrALAZINE (APRESOLINE) injection 10 mg  . [COMPLETED] magnesium sulfate IVPB 2 g 50 mL  . metoprolol tartrate (LOPRESSOR) injection 2.5 mg  . metoprolol tartrate (LOPRESSOR) tablet 75 mg  . pantoprazole (PROTONIX) EC tablet 40 mg  . phenol (CHLORASEPTIC) mouth spray 1 spray  . RESOURCE THICKENUP CLEAR  . senna-docusate (Senokot-S) tablet 1 tablet  . traMADol (ULTRAM) tablet 50 mg   Outpatient Encounter Medications as of 03/09/2018  Medication Sig  . Abaloparatide (TYMLOS) 3120 MCG/1.56ML SOPN Inject 80 mcg into the skin daily.  Marland Kitchen acetaminophen (TYLENOL) 500 MG tablet Take 500 mg by mouth every 6 (six) hours as needed for mild pain.  Marland Kitchen amLODipine (NORVASC) 5 MG tablet Take 5 mg by mouth daily.  Marland Kitchen amoxicillin-clavulanate (AUGMENTIN) 875-125 MG tablet Take 1 tablet by mouth 2 (two) times daily for 10 days.  Marland Kitchen aspirin 325 MG tablet Take 1 tablet (325 mg total) by mouth daily. Take through 03/12/2018  . atorvastatin (LIPITOR) 80 MG tablet Take 1 tablet (80 mg total) by mouth at bedtime.  . Calcium Carbonate-Vit D-Min (CALCIUM 1200 PO) Take 1 tablet by mouth at bedtime.   Marland Kitchen estrogens, conjugated, (PREMARIN) 0.3 MG tablet Take 1 tablet (0.3 mg total) by mouth every evening.  . fluticasone (FLONASE) 50 MCG/ACT nasal spray Place 2 sprays into both nostrils daily.  Marland Kitchen loperamide (IMODIUM) 2 MG capsule Take 2 mg by mouth daily as needed for diarrhea or loose stools.   . metoprolol succinate (TOPROL-XL) 50 MG 24 hr tablet Take 75 mg by mouth daily. Take with or immediately following a meal.  . nitroGLYCERIN (NITROSTAT) 0.4 MG SL tablet Place 1 tablet (0.4 mg total) under the tongue every 5 (five) minutes as  needed for chest pain.  . pantoprazole (PROTONIX) 40 MG tablet Take 40 mg by mouth daily.  . Zinc 50 MG CAPS Take 50 mg by mouth daily.  . [DISCONTINUED] aspirin 81 MG tablet Take 81 mg by mouth daily.  . [DISCONTINUED] azithromycin (ZITHROMAX Z-PAK) 250 MG tablet Take 2 tablets (500 mg) on  Day 1,  followed by 1 tablet (250 mg) once daily on Days 2 through 5. (Patient not taking: Reported on 03/02/2018)  . [DISCONTINUED] fluticasone (FLONASE) 50 MCG/ACT nasal spray USE TWO SPRAYS IN EACH NOSTRIL ONCE DAILY (Patient taking differently: Place 1 spray into both nostrils daily as needed for allergies. USE TWO SPRAYS IN EACH NOSTRIL ONCE DAILY)  . [DISCONTINUED] Insulin Pen Needle (PEN NEEDLES) 30G X 8 MM MISC 1 each by Does not apply route daily. Use one pen needle daily to inject Tymlos.  . [DISCONTINUED] lidocaine (LIDODERM) 5 % Place 1 patch onto the skin daily. Remove & Discard patch within 12 hours or as directed by MD (Patient not taking: Reported on 03/02/2018)  . [DISCONTINUED] pantoprazole (PROTONIX) 40 MG tablet TAKE 1 TABLET EVERY MORNING (Patient taking differently: Take 40 mg by mouth every morning. )  . [DISCONTINUED] predniSONE (DELTASONE) 1 MG tablet Take 3 tablets (3 mg total) by mouth daily with breakfast. (Patient not taking: Reported on 03/02/2018)  . [DISCONTINUED] PREMARIN 0.3 MG tablet TAKE 1 TABLET DAILY FOR 21 DAYS, THEN DO NOT TAKE FOR 7 DAYS (Patient taking differently: Take 0.3 mg by mouth at bedtime. )  . [DISCONTINUED] XARELTO 15 MG TABS tablet TAKE 1 TABLET DAILY WITH SUPPER (Patient taking differently: Take 15 mg by mouth daily with supper. )     Review of Systems  There were no vitals  filed for this visit. There is no height or weight on file to calculate BMI. Physical Exam  Labs reviewed: Basic Metabolic Panel: Recent Labs    10/25/17 0702 10/26/17 0647  03/06/18 0423 03/07/18 0709 03/08/18 1019  NA 140 139   < > 140 141 137  K 4.2 4.0   < > 3.7 4.0 4.0    CL 108 103   < > 110 115* 111  CO2 22 28   < > 18* 19* 19*  GLUCOSE 94 100*   < > 109* 102* 106*  BUN 18 18   < > 15 18 17   CREATININE 1.12* 1.22*   < > 1.00 0.97 0.86  CALCIUM 8.6* 8.8*   < > 8.8* 8.4* 8.5*  MG 2.5* 1.9  --   --   --  1.7   < > = values in this interval not displayed.   Liver Function Tests: Recent Labs    10/25/17 0702 10/26/17 0647 11/03/17 1228 03/02/18 1729  AST 16 16 21 20   ALT 12 10 13 12   ALKPHOS 66 68  --  83  BILITOT 1.1 1.1 0.3 0.9  PROT 5.9* 6.4* 6.7  6.6 7.6  ALBUMIN 2.7* 2.8*  --  3.6   Recent Labs    10/24/17 1359  LIPASE 34   No results for input(s): AMMONIA in the last 8760 hours. CBC: Recent Labs    10/25/17 0702 03/02/18 1729  03/04/18 0428 03/06/18 0423 03/08/18 1019  WBC 8.0 7.7  --  6.9 9.3  --   NEUTROABS 5.3 5.2  --   --   --   --   HGB 11.6* 11.3*   < > 11.3* 11.3* 11.0*  HCT 38.5 38.0   < > 37.0 37.7 37.5  MCV 93.2 89.6  --  88.3 87.7  --   PLT 499* 381  --  345 354  --    < > = values in this interval not displayed.   Cardiac Enzymes: Recent Labs    10/24/17 2010 10/25/17 0222 10/25/17 0702  TROPONINI 0.07* 0.07* 0.05*   BNP: Invalid input(s): POCBNP Lab Results  Component Value Date   HGBA1C 5.5 03/03/2018   Lab Results  Component Value Date   TSH 0.321 (L) 10/24/2017   No results found for: VITAMINB12 No results found for: FOLATE No results found for: IRON, TIBC, FERRITIN  Imaging and Procedures obtained prior to SNF admission: No results found.  Assessment/Plan There are no diagnoses linked to this encounter.   Family/ staff Communication:   Labs/tests ordered:   This encounter was created in error - please disregard.

## 2018-03-09 NOTE — Progress Notes (Signed)
CSW following for discharge. CSW confirmed bed still available at [facility] for patient.  CSW to follow.  Laveda Abbe, [place] Clinical Social Worker [phone]

## 2018-03-09 NOTE — Progress Notes (Signed)
  Speech Language Pathology Treatment: Dysphagia  Patient Details Name: Brenda Hall MRN: [id number] DOB: 06-May-1930 Today's Date: 03/09/2018 Time: 5631-4970 SLP Time Calculation (min) (ACUTE ONLY): 34 min  Assessment / Plan / Recommendation Clinical Impression  Pt seen to assess po tolerance and assess appropriateness for [employer] protocol to increase her QOL/hydration. CXR yesterday showed atelectasis but no pna.     Upon entrance to room, pt consuming Ensure via straw.  She takes large boluses and needs total cues to take small amount *pinch off straw.  Small boluses tolerated better.   As pt desires water, SLP reviewed water protocol - having pt brush her dentition.  Pt was then provided with water via straw and tsp.  Overt congested cough and wet voice noted after swallowing via straw with belching.  Pt admits she was belching with po prior to admit, causing SLP to suspect esophageal component to dysphagia.  Decreasing bolus size to tsp amounts was effective to prevent pt from overt indications of aspiration.  Pt with poor awareness to overt coughing and suspected larger amount of aspiration but was agreeable to consume TSPS of thin water after oral care.  At some point, would recommend a palliative referral for this pt given her advanced age, 3 admits in six months and significant stroke.  Educated son, grandson and pt to swallow precautions/recommendations.    HPI HPI: 84 yo female adm to [facility] with left sided weakness (dense hemiplegia), dysarthria and AMS.  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 PMH + for shingles, PMR, TIA, CAD, recent viral infection- s/p Zpac use per pt.  Swallow and speech evaluation ordered.  Pt underwent MBS and is on puree/nectar diet.  Per notes, she wants thin water -       SLP Plan  Continue with current plan of care       Recommendations  Diet recommendations: Dysphagia 1 (puree);Nectar-thick liquid(water protocol) Liquids provided via:  Straw;Teaspoon(tsp for water) Supervision: Full supervision/cueing for compensatory strategies;Staff to assist with self feeding(or family) Compensations: Slow rate;Small sips/bites;Other (Comment)(check for oral pocketing) Postural Changes and/or Swallow Maneuvers: Seated upright 90 degrees;Upright 30-60 min after meal                Oral Care Recommendations: Oral care QID Follow up Recommendations: Skilled Nursing facility SLP Visit Diagnosis: Dysphagia, oropharyngeal phase (R13.12) Plan: Continue with current plan of care       [place], [facility] SLP Acute Rehab Services Pager [phone] Office [phone]  Macario Golds 03/09/2018, 12:48 PM

## 2018-03-10 ENCOUNTER — Inpatient Hospital Stay (HOSPITAL_COMMUNITY): Payer: Medicare Other

## 2018-03-10 DIAGNOSIS — M7989 Other specified soft tissue disorders: Secondary | ICD-10-CM

## 2018-03-10 DIAGNOSIS — I495 Sick sinus syndrome: Secondary | ICD-10-CM

## 2018-03-10 LAB — BASIC METABOLIC PANEL
Anion gap: 7 (ref 5–15)
BUN: 18 mg/dL (ref 8–23)
CALCIUM: 8.8 mg/dL — AB (ref 8.9–10.3)
CO2: 21 mmol/L — ABNORMAL LOW (ref 22–32)
Chloride: 110 mmol/L (ref 98–111)
Creatinine, Ser: 1.18 mg/dL — ABNORMAL HIGH (ref 0.44–1.00)
GFR calc Af Amer: 47 mL/min — ABNORMAL LOW (ref >60)
GFR, EST NON AFRICAN AMERICAN: 41 mL/min — AB (ref >60)
GLUCOSE: 111 mg/dL — AB (ref 70–99)
Potassium: 4 mmol/L (ref 3.5–5.1)
Sodium: 138 mmol/L (ref 135–145)

## 2018-03-10 LAB — TSH: TSH: 0.883 u[IU]/mL (ref 0.350–4.500)

## 2018-03-10 LAB — T4, FREE: Free T4: 1.59 ng/dL (ref 0.82–1.77)

## 2018-03-10 LAB — MAGNESIUM: Magnesium: 2 mg/dL (ref 1.7–2.4)

## 2018-03-10 NOTE — Progress Notes (Signed)
Progress Note  Patient Name: Brenda Hall Date of Encounter: 03/10/2018  Primary Cardiologist: Carlyle Dolly, MD   Subjective   Complains of pain in her neck and back.  L UE hurts.  Upset that she isn't getting enough water.  Denies chest pain or shortness of breath.  Denies palpitations.   Inpatient Medications    Scheduled Meds: . aspirin  300 mg Rectal Daily   Or  . aspirin  325 mg Oral Daily  . atorvastatin  80 mg Oral QHS  . diltiazem  30 mg Oral Q6H  . heparin  5,000 Units Subcutaneous Q8H  . metoprolol tartrate  100 mg Oral BID  . pantoprazole  40 mg Oral q morning - 10a   Continuous Infusions:  PRN Meds: acetaminophen **OR** acetaminophen (TYLENOL) oral liquid 160 mg/5 mL **OR** acetaminophen, diphenhydrAMINE, hydrALAZINE, metoprolol tartrate, phenol, RESOURCE THICKENUP CLEAR, senna-docusate, traMADol   Vital Signs    Vitals:   03/09/18 1939 03/10/18 0000 03/10/18 0308 03/10/18 0839  BP: 128/69 111/60 126/61 125/78  Pulse: 65 74 64 67  Resp: 20 18 18 20   Temp: 98.4 F (36.9 C) 98.2 F (36.8 C) 98.5 F (36.9 C) (!) 97.4 F (36.3 C)  TempSrc: Oral Oral Oral Oral  SpO2: 96% 97% 96% 98%  Weight:      Height:        Intake/Output Summary (Last 24 hours) at 03/10/2018 1113 Last data filed at 03/09/2018 1800 Gross per 24 hour  Intake 360 ml  Output 400 ml  Net -40 ml   Filed Weights   03/02/18 1724  Weight: 69.6 kg    Telemetry    Atrial fibrillation.  Rate 90-120s.  Pauses up to 2.4 sec. - Personally Reviewed  ECG    n/a - Personally Reviewed  Physical Exam   VS:  BP 125/78 (BP Location: Right Arm)   Pulse 67   Temp (!) 97.4 F (36.3 C) (Oral)   Resp 20   Ht 5\' 5"  (1.651 m)   Wt 69.6 kg   SpO2 98%   BMI 25.53 kg/m  , BMI Body mass index is 25.53 kg/m. GENERAL:  Chronically ill-appearing.  Coughs after drinking.  HEENT: Pupils equal round and reactive, fundi not visualized, oral mucosa unremarkable NECK:  No jugular  venous distention, waveform within normal limits, carotid upstroke brisk and symmetric, no bruits LUNGS:  Clear to auscultation bilaterally HEART:  Irregularly irregular.  PMI not displaced or sustained,S1 and S2 within normal limits, no S3, no S4, no clicks, no rubs, no murmurs ABD:  Flat, positive bowel sounds normal in frequency in pitch, no bruits, no rebound, no guarding, no midline pulsatile mass, no hepatomegaly, no splenomegaly EXT:  2 plus pulses throughout, no edema, no cyanosis no clubbing SKIN:  No rashes no nodules NEURO:  Cranial nerves II through XII grossly intact, motor grossly intact throughout [facility]:  Cognitively intact, oriented to person place and time   Labs    Chemistry Recent Labs  Lab 03/06/18 0423 03/07/18 0709 03/08/18 1019  NA 140 141 137  K 3.7 4.0 4.0  CL 110 115* 111  CO2 18* 19* 19*  GLUCOSE 109* 102* 106*  BUN 15 18 17   CREATININE 1.00 0.97 0.86  CALCIUM 8.8* 8.4* 8.5*  GFRNONAA 50* 52* 60*  GFRAA 58* >60 >60  ANIONGAP 12 7 7      Hematology Recent Labs  Lab 03/04/18 0428 03/06/18 0423 03/08/18 1019  WBC 6.9 9.3  --   RBC  4.19 4.30  --   HGB 11.3* 11.3* 11.0*  HCT 37.0 37.7 37.5  MCV 88.3 87.7  --   MCH 27.0 26.3  --   MCHC 30.5 30.0  --   RDW 15.3 15.6*  --   PLT 345 354  --     Cardiac EnzymesNo results for input(s): TROPONINI in the last 168 hours. No results for input(s): TROPIPOC in the last 168 hours.   BNPNo results for input(s): BNP, PROBNP in the last 168 hours.   DDimer No results for input(s): DDIMER in the last 168 hours.   Radiology    No results found.  Cardiac Studies   Echo 02/2018:  Study Conclusions  - Left ventricle: The cavity size was normal. Wall thickness was   increased in a pattern of mild LVH. There was mild focal basal   hypertrophy of the septum. Systolic function was vigorous. The   estimated ejection fraction was in the range of 65% to 70%. Wall   motion was normal; there were no  regional wall motion   abnormalities. Doppler parameters are consistent with abnormal   left ventricular relaxation (grade 1 diastolic dysfunction). - Pulmonary arteries: Systolic pressure was moderately to severely   increased. PA peak pressure: 60 mm Hg (S).  Impressions:  - Vigorous LV systolic function; mild LVH; mild diastolic   dysfunction; sclerotic aortic valve; mild TR; moderate to severe   pulmonary hypertension.  Patient Profile     Brenda Hall is an 59F with CAD s/p NSTEMI and BMS, PAF on Xarelto, CKD 3, HTN, HL and PMR here with acute stroke and atrial fibrillation with RVR.  Assessment & Plan    # Atrial fibrillation with RVR: # Sick sinus syndrome: Rates remain poorly controlled after increasing diltiazem and metoprolol.  However she is having pauses up to 2.4 seconds.  Therefore we cannot continue to titrate her nodal agents.  She is asymptomatic.  We will ask the electrophysiology team to assess her to see if they think pacemaker would be appropriate or if they have other alternative ideas for her management.  Anticoagulation is on hold for her stroke.  Continue with full-strength aspirin for now.  Continue diltiazem and metoprolol.  # CAD: Not an active issue.  Continue aspirin, metoprolol, and atorvastatin.  # Stroke: Antiregulation on hold for 1 week.  She is considered a Xarelto failure, though she may have not been taking it with a heavy meal as instructed.  The plan is to switch to Eliquis once she is able to resume antiregulation.  # L UE edema: Unclear why she has so much edema in this arm.  It may be dependent edema in the setting of not moving the arm.  We will get an upper extremity ultrasound to rule out DVT.       For questions or updates, please contact [place] Please consult [email or web address] for contact info under        Signed, Skeet Latch, MD  03/10/2018, 11:13 AM

## 2018-03-10 NOTE — Progress Notes (Signed)
Pt continues to have issues with her Afib and increased HR. CM sent updated information to [facility] about the change in her condition and that the hospital is keeping the patient currently for treatment. CM following.

## 2018-03-10 NOTE — Progress Notes (Signed)
Left upper extremity venous duplex has been completed. Negative for DVT. There is evidence of age indeterminate superficial vein thrombosis involving the cephalic vein of the left upper extremity.  03/10/18 1:53 PM Brenda Hall RVT

## 2018-03-10 NOTE — Consult Note (Addendum)
ELECTROPHYSIOLOGY CONSULT NOTE    Patient ID: Brenda Hall MRN: [id number], DOB/AGE: July 09, 1930 84 y.o.  Admit date: 03/02/2018 Date of Consult: 03/10/2018  Primary Physician: [name], MD Primary Cardiologist: Branch Electrophysiologist: Phelan Goers (new this admission)  Patient Profile: Brenda Hall is a 84 y.o. female with a history of CAD, HTN, CKD, persistent atrial fibrillation, and acute stroke who is being seen today for the evaluation of tachy/brady syndrome at the request of Dr Oval Linsey.  HPI:  Brenda Hall is a 84 y.o. female with the above past medical history.  She was admitted on 03/02/18 with left sided weakness, facial droop and slow speech.  Imaging demonstrated R MCA territory infarct.  Her anticoagulation has been held 2/2 size of stroke.  She has remained in AF with RVR. Rate control has been difficult despite BB and CCB. She has also had up to 2.4 second pauses. EP has been asked to evaluate for treatment options.    Echo demonstrated EF 65-70%, mild LVH, mild TR, moderate to severe pulmonary hypertension, LA 42.    She denies chest pain, palpitations, dyspnea, PND, orthopnea, nausea, vomiting, dizziness, syncope, edema, weight gain, or early satiety. She has a left arm pulsing pain as well as left arm swelling that she is most concerned about today. She has no awareness of AF or pauses.   Past Medical History:  Diagnosis Date  . Anemia   . CAD in native artery    a. NSTEMI 12/2005 s/p BMS to Cx, prior normal LVEF.  Marland Kitchen Cancer ([place]) 1988 and  2009   BCC nose and  Melanoma back  . Cancer of skin Jan. 2014   [facility] Forehead and Nose  . Carotid stenosis    mild  . Chronic back pain   . CKD (chronic kidney disease), stage III ([place])   . Coronary atherosclerosis of unspecified type of vessel, native or graft   . DJD (degenerative joint disease) of knee   . History of TIA (transient ischemic attack)   . HTN (hypertension)   . IBS (irritable  bowel syndrome)   . Myocardial infarction [facility])    5 yrs ago     dr wall  cardiac  . Osteoporosis   . Other diseases of lung, not elsewhere classified    lung nodule  . Polymyalgia rheumatica ([place])   . Shingles 01/2016-02/2016  . Stroke [facility])      Surgical History:  Past Surgical History:  Procedure Laterality Date  . ABDOMINAL HYSTERECTOMY  1977   partial  . BACK SURGERY  1982  . CARDIAC CATHETERIZATION    . COLONOSCOPY N/A 01/27/2013   Procedure: COLONOSCOPY;  Surgeon: Rogene Houston, MD;  Location: [facility] ENDO SUITE;  Service: Endoscopy;  Laterality: N/A;  1030  . EYE SURGERY Bilateral 04/2017  . EYE SURGERY Right 09/23/2017  . IR KYPHO LUMBAR INC FX REDUCE BONE BX UNI/BIL CANNULATION INC/IMAGING  09/30/2017  . IR VERTEBROPLASTY CERV/THOR BX INC UNI/BIL INC/INJECT/IMAGING  09/05/2017  . JOINT REPLACEMENT  2001 and  2011   6 knee surgeries   bil replacement  . KNEE ARTHROPLASTY    . KNEE ARTHROSCOPY    . LUMBAR LAMINECTOMY    . PR VEIN BYPASS GRAFT,AORTO-FEM-POP  2010  . removal of melanoma from back  2007  . SPINE SURGERY  2013   Broken back: fusion surgery  . tonisllectomy  1944  . total knee anthroplasty    . VERTEBROPLASTY  07/11/2011   Procedure: VERTEBROPLASTY;  Surgeon: Faythe Ghee, MD;  Location: [place] NEURO ORS;  Service: Neurosurgery;  Laterality: N/A;  Vertebroplasty of Lumbar Two     Medications Prior to Admission  Medication Sig Dispense Refill Last Dose  . Abaloparatide (TYMLOS) 3120 MCG/1.56ML SOPN Inject 80 mcg into the skin daily. 1 pen 2 Taking  . acetaminophen (TYLENOL) 500 MG tablet Take 500 mg by mouth every 6 (six) hours as needed for mild pain.   Taking  . amLODipine (NORVASC) 5 MG tablet Take 5 mg by mouth daily.   Taking  . atorvastatin (LIPITOR) 80 MG tablet Take 1 tablet (80 mg total) by mouth at bedtime. 90 tablet 1 Taking  . Calcium Carbonate-Vit D-Min (CALCIUM 1200 PO) Take 1 tablet by mouth at bedtime.    Taking  . estrogens, conjugated,  (PREMARIN) 0.3 MG tablet Take 1 tablet (0.3 mg total) by mouth every evening.   Taking  . loperamide (IMODIUM) 2 MG capsule Take 2 mg by mouth daily as needed for diarrhea or loose stools.    Taking  . metoprolol succinate (TOPROL-XL) 50 MG 24 hr tablet Take 75 mg by mouth daily. Take with or immediately following a meal.   Taking  . nitroGLYCERIN (NITROSTAT) 0.4 MG SL tablet Place 1 tablet (0.4 mg total) under the tongue every 5 (five) minutes as needed for chest pain. 30 tablet 0 Taking  . Zinc 50 MG CAPS Take 50 mg by mouth daily.   Taking  . [DISCONTINUED] aspirin 81 MG tablet Take 81 mg by mouth daily.   03/02/2018 at Unknown time  . [DISCONTINUED] XARELTO 15 MG TABS tablet TAKE 1 TABLET DAILY WITH SUPPER (Patient taking differently: Take 15 mg by mouth daily with supper. ) 90 tablet 1 03/01/2018 at 2300  . amoxicillin-clavulanate (AUGMENTIN) 875-125 MG tablet Take 1 tablet by mouth 2 (two) times daily for 10 days. 20 tablet 0 Taking  . fluticasone (FLONASE) 50 MCG/ACT nasal spray Place 2 sprays into both nostrils daily.   Taking  . pantoprazole (PROTONIX) 40 MG tablet Take 40 mg by mouth daily.   Taking  . [DISCONTINUED] azithromycin (ZITHROMAX Z-PAK) 250 MG tablet Take 2 tablets (500 mg) on  Day 1,  followed by 1 tablet (250 mg) once daily on Days 2 through 5. (Patient not taking: Reported on 03/02/2018) 6 each 0 Completed Course at Unknown time  . [DISCONTINUED] lidocaine (LIDODERM) 5 % Place 1 patch onto the skin daily. Remove & Discard patch within 12 hours or as directed by MD (Patient not taking: Reported on 03/02/2018) 20 patch 0 Not Taking at Unknown time  . [DISCONTINUED] predniSONE (DELTASONE) 1 MG tablet Take 3 tablets (3 mg total) by mouth daily with breakfast. (Patient not taking: Reported on 03/02/2018) 90 tablet 0 Not Taking at Unknown time    Inpatient Medications:  . aspirin  300 mg Rectal Daily   Or  . aspirin  325 mg Oral Daily  . atorvastatin  80 mg Oral QHS  . diltiazem   30 mg Oral Q6H  . heparin  5,000 Units Subcutaneous Q8H  . metoprolol tartrate  100 mg Oral BID  . pantoprazole  40 mg Oral q morning - 10a    Allergies:  Allergies  Allergen Reactions  . Epinephrine Other (See Comments)    During dental procedure-caused facial swelling at injection site. Pt has had epinephrine since then for nose and facial surgery and did fine with it per pt.  . Clopidogrel Bisulfate Itching and Rash  Plavix  . Oxycodone     Hallucinations. Pt states that she can tolerate hydrocodone    Social History   Socioeconomic History  . Marital status: Widowed    Spouse name: Not on file  . Number of children: Not on file  . Years of education: Not on file  . Highest education level: Not on file  Occupational History  . Not on file  Social Needs  . Financial resource strain: Not on file  . Food insecurity:    Worry: Not on file    Inability: Not on file  . Transportation needs:    Medical: Not on file    Non-medical: Not on file  Tobacco Use  . Smoking status: Never Smoker  . Smokeless tobacco: Never Used  . Tobacco comment: tobacco use - no  Substance and Sexual Activity  . Alcohol use: Yes    Alcohol/week: 0.0 standard drinks    Comment: occasionally a glass of wine or margarita per pt  . Drug use: No  . Sexual activity: Not on file  Lifestyle  . Physical activity:    Days per week: Not on file    Minutes per session: Not on file  . Stress: Not on file  Relationships  . Social connections:    Talks on phone: Not on file    Gets together: Not on file    Attends religious service: Not on file    Active member of club or organization: Not on file    Attends meetings of clubs or organizations: Not on file    Relationship status: Not on file  . Intimate partner violence:    Fear of current or ex partner: Not on file    Emotionally abused: Not on file    Physically abused: Not on file    Forced sexual activity: Not on file  Other Topics Concern    . Not on file  Social History Narrative   Married, retired. 3 children. Regular exercise -yes; hobbies = swimming.     Family History  Problem Relation Age of Onset  . Heart disease Father   . Hypertension Father   . Heart attack Father   . Cancer Mother        bladder   . Hypertension Daughter   . Atrial fibrillation Daughter   . Hypertension Son   . Colon cancer Neg Hx      Review of Systems: All other systems reviewed and are otherwise negative except as noted above.  Physical Exam: Vitals:   03/10/18 0000 03/10/18 0308 03/10/18 0839 03/10/18 1125  BP: 111/60 126/61 125/78 (!) 118/56  Pulse: 74 64 67 (!) 52  Resp: 18 18 20 17   Temp: 98.2 F (36.8 C) 98.5 F (36.9 C) (!) 97.4 F (36.3 C) 98.6 F (37 C)  TempSrc: Oral Oral Oral Axillary  SpO2: 97% 96% 98% 99%  Weight:      Height:        GEN- The patient is elderly appearing, alert and oriented x 3 today.   HEENT: normocephalic, atraumatic; sclera clear, conjunctiva pink; hearing intact; oropharynx clear; neck supple Lungs- Clear to ausculation bilaterally, normal work of breathing.  No wheezes, rales, rhonchi Heart- Tachycardic irregular rate and rhythm  GI- soft, non-tender, non-distended, bowel sounds present Extremities- no clubbing, cyanosis, +left arm edema MS- no significant deformity or atrophy Skin- warm and dry, no rash or lesion Psych- euthymic mood, full affect  Labs:   Lab Results  Component Value Date  WBC 9.3 03/06/2018   HGB 11.0 (L) 03/08/2018   HCT 37.5 03/08/2018   MCV 87.7 03/06/2018   PLT 354 03/06/2018    Recent Labs  Lab 03/10/18 1033  NA 138  K 4.0  CL 110  CO2 21*  BUN 18  CREATININE 1.18*  CALCIUM 8.8*  GLUCOSE 111*      Radiology/Studies: Ct Angio Head W Or Wo Contrast  Result Date: 03/02/2018 CLINICAL DATA:  Left-sided weakness EXAM: CT ANGIOGRAPHY HEAD AND NECK CT PERFUSION BRAIN TECHNIQUE: Multidetector CT imaging of the head and neck was performed using the  standard protocol during bolus administration of intravenous contrast. Multiplanar CT image reconstructions and MIPs were obtained to evaluate the vascular anatomy. Carotid stenosis measurements (when applicable) are obtained utilizing NASCET criteria, using the distal internal carotid diameter as the denominator. Multiphase CT imaging of the brain was performed following IV bolus contrast injection. Subsequent parametric perfusion maps were calculated using RAPID software. CONTRAST:  10mL ISOVUE-370 IOPAMIDOL (ISOVUE-370) INJECTION 76% COMPARISON:  None. FINDINGS: CTA NECK FINDINGS SKELETON: There is no bony spinal canal stenosis. No lytic or blastic lesion. OTHER NECK: Normal pharynx, larynx and major salivary glands. No cervical lymphadenopathy. Unremarkable thyroid gland. UPPER CHEST: No pneumothorax or pleural effusion. No nodules or masses. AORTIC ARCH: There is mild calcific atherosclerosis of the aortic arch. There is no aneurysm, dissection or hemodynamically significant stenosis of the visualized ascending aorta and aortic arch. Conventional 3 vessel aortic branching pattern. The visualized proximal subclavian arteries are widely patent. RIGHT CAROTID SYSTEM: --Common carotid artery: Widely patent origin without common carotid artery dissection or aneurysm. --Internal carotid artery: No dissection, occlusion or aneurysm. There is mixed density atherosclerosis extending into the proximal ICA, resulting in less than 50% stenosis. --External carotid artery: No acute abnormality. LEFT CAROTID SYSTEM: --Common carotid artery: Widely patent origin without common carotid artery dissection or aneurysm. --Internal carotid artery: No dissection, occlusion or aneurysm. There is mixed density atherosclerosis extending into the proximal ICA, resulting in less than 50% stenosis. --External carotid artery: No acute abnormality. VERTEBRAL ARTERIES: Left dominant configuration. There is moderate narrowing of both vertebral  artery origins. No dissection, occlusion or flow-limiting stenosis to the vertebrobasilar confluence. CTA HEAD FINDINGS ANTERIOR CIRCULATION: --Intracranial internal carotid arteries: Atherosclerotic calcification of the internal carotid arteries at the skull base without hemodynamically significant stenosis. --Anterior cerebral arteries: Normal. Both A1 segments are present. Patent anterior communicating artery. --Middle cerebral arteries: There is a short segment occlusion of the proximal right M2 segment. The length of the occluded segment is approximately 5 mm. --Posterior communicating arteries: Present bilaterally. POSTERIOR CIRCULATION: --Basilar artery: Normal. --Posterior cerebral arteries: Normal. --Superior cerebellar arteries: Normal. --Inferior cerebellar arteries: Normal anterior and posterior inferior cerebellar arteries. VENOUS SINUSES: As permitted by contrast timing, patent. ANATOMIC VARIANTS: None DELAYED PHASE: No parenchymal contrast enhancement. Review of the MIP images confirms the above findings. ASPECTS ([place] Stroke Program Early CT Score) - Ganglionic level infarction (caudate, lentiform nuclei, internal capsule, insula, M1-M3 cortex): 5 - Supraganglionic infarction (M4-M6 cortex): 1 Total score (0-10 with 10 being normal): 6 CT Brain Perfusion Findings: CBF (<30%) Volume: 3mL Perfusion (Tmax>6.0s) volume: 16mL Mismatch Volume: 37mL Infarction Location:Posterior right MCA territory IMPRESSION: 1. Short segment occlusion of the right middle cerebral artery proximal M2 segment. 2. 21 mL region of ischemia in the posterior right MCA territory by CT perfusion criteria. The CT perfusion data does not demonstrate a core infarct, but the ASPECT Score is 6 with findings of early infarction in the  posterior right MCA territory. 3. Bilateral carotid bifurcation atherosclerosis with less than 50% stenosis. 4.  Aortic atherosclerosis (ICD10-I70.0). These results were called by telephone at the time  of interpretation on 03/02/2018 at 6:07 pm to Dr. Daleen Bo , who verbally acknowledged these results. Electronically Signed   By: Ulyses Jarred M.D.   On: 03/02/2018 18:25   Ct Angio Neck W And/or Wo Contrast  Result Date: 03/02/2018 CLINICAL DATA:  Left-sided weakness EXAM: CT ANGIOGRAPHY HEAD AND NECK CT PERFUSION BRAIN TECHNIQUE: Multidetector CT imaging of the head and neck was performed using the standard protocol during bolus administration of intravenous contrast. Multiplanar CT image reconstructions and MIPs were obtained to evaluate the vascular anatomy. Carotid stenosis measurements (when applicable) are obtained utilizing NASCET criteria, using the distal internal carotid diameter as the denominator. Multiphase CT imaging of the brain was performed following IV bolus contrast injection. Subsequent parametric perfusion maps were calculated using RAPID software. CONTRAST:  29mL ISOVUE-370 IOPAMIDOL (ISOVUE-370) INJECTION 76% COMPARISON:  None. FINDINGS: CTA NECK FINDINGS SKELETON: There is no bony spinal canal stenosis. No lytic or blastic lesion. OTHER NECK: Normal pharynx, larynx and major salivary glands. No cervical lymphadenopathy. Unremarkable thyroid gland. UPPER CHEST: No pneumothorax or pleural effusion. No nodules or masses. AORTIC ARCH: There is mild calcific atherosclerosis of the aortic arch. There is no aneurysm, dissection or hemodynamically significant stenosis of the visualized ascending aorta and aortic arch. Conventional 3 vessel aortic branching pattern. The visualized proximal subclavian arteries are widely patent. RIGHT CAROTID SYSTEM: --Common carotid artery: Widely patent origin without common carotid artery dissection or aneurysm. --Internal carotid artery: No dissection, occlusion or aneurysm. There is mixed density atherosclerosis extending into the proximal ICA, resulting in less than 50% stenosis. --External carotid artery: No acute abnormality. LEFT CAROTID SYSTEM:  --Common carotid artery: Widely patent origin without common carotid artery dissection or aneurysm. --Internal carotid artery: No dissection, occlusion or aneurysm. There is mixed density atherosclerosis extending into the proximal ICA, resulting in less than 50% stenosis. --External carotid artery: No acute abnormality. VERTEBRAL ARTERIES: Left dominant configuration. There is moderate narrowing of both vertebral artery origins. No dissection, occlusion or flow-limiting stenosis to the vertebrobasilar confluence. CTA HEAD FINDINGS ANTERIOR CIRCULATION: --Intracranial internal carotid arteries: Atherosclerotic calcification of the internal carotid arteries at the skull base without hemodynamically significant stenosis. --Anterior cerebral arteries: Normal. Both A1 segments are present. Patent anterior communicating artery. --Middle cerebral arteries: There is a short segment occlusion of the proximal right M2 segment. The length of the occluded segment is approximately 5 mm. --Posterior communicating arteries: Present bilaterally. POSTERIOR CIRCULATION: --Basilar artery: Normal. --Posterior cerebral arteries: Normal. --Superior cerebellar arteries: Normal. --Inferior cerebellar arteries: Normal anterior and posterior inferior cerebellar arteries. VENOUS SINUSES: As permitted by contrast timing, patent. ANATOMIC VARIANTS: None DELAYED PHASE: No parenchymal contrast enhancement. Review of the MIP images confirms the above findings. ASPECTS ([place] Stroke Program Early CT Score) - Ganglionic level infarction (caudate, lentiform nuclei, internal capsule, insula, M1-M3 cortex): 5 - Supraganglionic infarction (M4-M6 cortex): 1 Total score (0-10 with 10 being normal): 6 CT Brain Perfusion Findings: CBF (<30%) Volume: 71mL Perfusion (Tmax>6.0s) volume: 24mL Mismatch Volume: 11mL Infarction Location:Posterior right MCA territory IMPRESSION: 1. Short segment occlusion of the right middle cerebral artery proximal M2 segment.  2. 21 mL region of ischemia in the posterior right MCA territory by CT perfusion criteria. The CT perfusion data does not demonstrate a core infarct, but the ASPECT Score is 6 with findings of early infarction in the posterior  right MCA territory. 3. Bilateral carotid bifurcation atherosclerosis with less than 50% stenosis. 4.  Aortic atherosclerosis (ICD10-I70.0). These results were called by telephone at the time of interpretation on 03/02/2018 at 6:07 pm to Dr. Daleen Bo , who verbally acknowledged these results. Electronically Signed   By: Ulyses Jarred M.D.   On: 03/02/2018 18:25   Mr Brain Wo Contrast  Result Date: 03/03/2018 CLINICAL DATA:  Stroke follow-up EXAM: MRI HEAD WITHOUT CONTRAST TECHNIQUE: Multiplanar, multiecho pulse sequences of the brain and surrounding structures were obtained without intravenous contrast. COMPARISON:  None. FINDINGS: BRAIN: Large area of acute ischemia within the right MCA territory, involving much of the frontal and parietal cortex and the right insula. No other diffusion abnormality. The midline structures are normal. No midline shift or other mass effect. There are old bilateral cerebellar infarcts and old infarcts of the right basal ganglia and thalamus. Early confluent hyperintense T2-weighted signal of the periventricular and deep white matter, most commonly due to chronic ischemic microangiopathy. There is cytotoxic edema throughout the distribution of the ischemia. The cerebral and cerebellar volume are age-appropriate. 2-3 scattered foci of chronic microhemorrhage. VASCULAR: Major intracranial arterial and venous sinus flow voids are normal. SKULL AND UPPER CERVICAL SPINE: Calvarial bone marrow signal is normal. There is no skull base mass. Visualized upper cervical spine and soft tissues are normal. SINUSES/ORBITS: No fluid levels or advanced mucosal thickening. No mastoid or middle ear effusion. The orbits are normal. IMPRESSION: 1. Large right MCA territory  infarct without hemorrhage or mass effect. 2. Findings of chronic ischemic microangiopathy with multiple small vessel infarcts. Electronically Signed   By: Ulyses Jarred M.D.   On: 03/03/2018 22:23   Ct Cerebral Perfusion W Contrast  Result Date: 03/02/2018 CLINICAL DATA:  Left-sided weakness EXAM: CT ANGIOGRAPHY HEAD AND NECK CT PERFUSION BRAIN TECHNIQUE: Multidetector CT imaging of the head and neck was performed using the standard protocol during bolus administration of intravenous contrast. Multiplanar CT image reconstructions and MIPs were obtained to evaluate the vascular anatomy. Carotid stenosis measurements (when applicable) are obtained utilizing NASCET criteria, using the distal internal carotid diameter as the denominator. Multiphase CT imaging of the brain was performed following IV bolus contrast injection. Subsequent parametric perfusion maps were calculated using RAPID software. CONTRAST:  63mL ISOVUE-370 IOPAMIDOL (ISOVUE-370) INJECTION 76% COMPARISON:  None. FINDINGS: CTA NECK FINDINGS SKELETON: There is no bony spinal canal stenosis. No lytic or blastic lesion. OTHER NECK: Normal pharynx, larynx and major salivary glands. No cervical lymphadenopathy. Unremarkable thyroid gland. UPPER CHEST: No pneumothorax or pleural effusion. No nodules or masses. AORTIC ARCH: There is mild calcific atherosclerosis of the aortic arch. There is no aneurysm, dissection or hemodynamically significant stenosis of the visualized ascending aorta and aortic arch. Conventional 3 vessel aortic branching pattern. The visualized proximal subclavian arteries are widely patent. RIGHT CAROTID SYSTEM: --Common carotid artery: Widely patent origin without common carotid artery dissection or aneurysm. --Internal carotid artery: No dissection, occlusion or aneurysm. There is mixed density atherosclerosis extending into the proximal ICA, resulting in less than 50% stenosis. --External carotid artery: No acute abnormality. LEFT  CAROTID SYSTEM: --Common carotid artery: Widely patent origin without common carotid artery dissection or aneurysm. --Internal carotid artery: No dissection, occlusion or aneurysm. There is mixed density atherosclerosis extending into the proximal ICA, resulting in less than 50% stenosis. --External carotid artery: No acute abnormality. VERTEBRAL ARTERIES: Left dominant configuration. There is moderate narrowing of both vertebral artery origins. No dissection, occlusion or flow-limiting stenosis to the vertebrobasilar  confluence. CTA HEAD FINDINGS ANTERIOR CIRCULATION: --Intracranial internal carotid arteries: Atherosclerotic calcification of the internal carotid arteries at the skull base without hemodynamically significant stenosis. --Anterior cerebral arteries: Normal. Both A1 segments are present. Patent anterior communicating artery. --Middle cerebral arteries: There is a short segment occlusion of the proximal right M2 segment. The length of the occluded segment is approximately 5 mm. --Posterior communicating arteries: Present bilaterally. POSTERIOR CIRCULATION: --Basilar artery: Normal. --Posterior cerebral arteries: Normal. --Superior cerebellar arteries: Normal. --Inferior cerebellar arteries: Normal anterior and posterior inferior cerebellar arteries. VENOUS SINUSES: As permitted by contrast timing, patent. ANATOMIC VARIANTS: None DELAYED PHASE: No parenchymal contrast enhancement. Review of the MIP images confirms the above findings. ASPECTS ([place] Stroke Program Early CT Score) - Ganglionic level infarction (caudate, lentiform nuclei, internal capsule, insula, M1-M3 cortex): 5 - Supraganglionic infarction (M4-M6 cortex): 1 Total score (0-10 with 10 being normal): 6 CT Brain Perfusion Findings: CBF (<30%) Volume: 80mL Perfusion (Tmax>6.0s) volume: 28mL Mismatch Volume: 71mL Infarction Location:Posterior right MCA territory IMPRESSION: 1. Short segment occlusion of the right middle cerebral artery  proximal M2 segment. 2. 21 mL region of ischemia in the posterior right MCA territory by CT perfusion criteria. The CT perfusion data does not demonstrate a core infarct, but the ASPECT Score is 6 with findings of early infarction in the posterior right MCA territory. 3. Bilateral carotid bifurcation atherosclerosis with less than 50% stenosis. 4.  Aortic atherosclerosis (ICD10-I70.0). These results were called by telephone at the time of interpretation on 03/02/2018 at 6:07 pm to Dr. Daleen Bo , who verbally acknowledged these results. Electronically Signed   By: Ulyses Jarred M.D.   On: 03/02/2018 18:25   Dg Chest Port 1 View  Result Date: 03/08/2018 CLINICAL DATA:  Developing cough EXAM: PORTABLE CHEST 1 VIEW COMPARISON:  10/24/17 FINDINGS: Cardiac shadow is within normal limits. Aortic calcifications are again seen. The lungs are well aerated bilaterally. Minimal left basilar atelectasis is seen. No significant pulmonary edema is noted. No bony abnormality is seen. IMPRESSION: Mild left basilar atelectasis. Electronically Signed   By: Inez Catalina M.D.   On: 03/08/2018 09:37   Dg Swallowing Func-speech Pathology  Result Date: 03/05/2018 Objective Swallowing Evaluation: Type of Study: MBS-Modified Barium Swallow Study  Patient Details Name: Juna H Hall MRN: [id number] Date of Birth: May 07, 1930 Today's Date: 03/05/2018 Time: SLP Start Time (ACUTE ONLY): 0945 -SLP Stop Time (ACUTE ONLY): 1010 SLP Time Calculation (min) (ACUTE ONLY): 25 min Past Medical History: Past Medical History: Diagnosis Date . Anemia  . CAD in native artery   a. NSTEMI 12/2005 s/p BMS to Cx, prior normal LVEF. Marland Kitchen Cancer ([place]) 1988 and  2009  BCC nose and  Melanoma back . Cancer of skin Jan. 2014  [facility] Forehead and Nose . Carotid stenosis   mild . Chronic back pain  . CKD (chronic kidney disease), stage III ([place])  . Coronary atherosclerosis of unspecified type of vessel, native or graft  . DJD (degenerative joint disease) of  knee  . History of TIA (transient ischemic attack)  . HTN (hypertension)  . IBS (irritable bowel syndrome)  . Myocardial infarction [facility])   5 yrs ago     dr wall  cardiac . Osteoporosis  . Other diseases of lung, not elsewhere classified   lung nodule . Polymyalgia rheumatica ([place])  . Shingles 01/2016-02/2016 . Stroke [facility])  Past Surgical History: Past Surgical History: Procedure Laterality Date . ABDOMINAL HYSTERECTOMY  1977  partial . BACK SURGERY  1982 . CARDIAC CATHETERIZATION   .  COLONOSCOPY N/A 01/27/2013  Procedure: COLONOSCOPY;  Surgeon: Rogene Houston, MD;  Location[facility];  Service: Endoscopy;  Laterality: N/A;  1030 . EYE SURGERY Bilateral 04/2017 . EYE SURGERY Right 09/23/2017 . IR KYPHO LUMBAR INC FX REDUCE BONE BX UNI/BIL CANNULATION INC/IMAGING  09/30/2017 . IR VERTEBROPLASTY CERV/THOR BX INC UNI/BIL INC/INJECT/IMAGING  09/05/2017 . JOINT REPLACEMENT  2001 and  2011  6 knee surgeries   bil replacement . KNEE ARTHROPLASTY   . KNEE ARTHROSCOPY   . LUMBAR LAMINECTOMY   . PR VEIN BYPASS GRAFT,AORTO-FEM-POP  2010 . removal of melanoma from back  2007 . SPINE SURGERY  2013  Broken back: fusion surgery . tonisllectomy  1944 . total knee anthroplasty   . VERTEBROPLASTY  07/11/2011  Procedure: VERTEBROPLASTY;  Surgeon: Faythe Ghee, MD;  Location: [place] NEURO ORS;  Service: Neurosurgery;  Laterality: N/A;  Vertebroplasty of Lumbar Two HPI: 84 yo female adm to [facility] with left sided weakness (dense hemiplegia), dysarthria and AMS.  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 PMH + for shingles, PMR, TIA, CAD, recent viral infection- s/p Zpac use per pt.  Swallow and speech evaluation ordered.  Pt did not pass the [place] swallow screen.   Subjective: Im going to disown my children, they wont give me any water Assessment / Plan / Recommendation CHL IP CLINICAL IMPRESSIONS 03/05/2018 Clinical Impression  Pt demonstrates a moderate oral dysphagia complicated by pt impulsivity, poor awarness and distracitbility. When total  assist was given pt was able to seal right side of lips on spoon or straw. Anterior spill more significant with cup; pt often began talking with her mouth full of liquid as well. It verbal cues and extra time pt was able to lingually propel 90% of bolus to pyriforms with moderate left oral residuals. Increased cueing with puree needed. There was a significant delay in swallow initiation with pooled liquid in the pyriforms for several seconds. With thin, this resulted in sensed aspiration before the swallow. Only very trace sensed penetration of nectar thick liquids observed after the swallow of mild residue mixing with secretions. Depsite deficits, pt tolerated nectar and puree quite well during study and appears to be able to sense, cough and eject trace penetrates that are likely to occur intermittently. Also of concern was signs of esophageal dysmotility on esophageal sweep (no radiologist present to confirm); esophageal precautions warranted. Will initiate Dys 1/nectar thick diet and monitor for tolerance and educate family and staff about precautions.  SLP Visit Diagnosis Dysphagia, oropharyngeal phase (R13.12) Attention and concentration deficit following -- Frontal lobe and executive function deficit following -- Impact on safety and function Moderate aspiration risk   CHL IP TREATMENT RECOMMENDATION 03/05/2018 Treatment Recommendations Therapy as outlined in treatment plan below   Prognosis 03/03/2018 Prognosis for Safe Diet Advancement Guarded Barriers to Reach Goals Severity of deficits;Behavior Barriers/Prognosis Comment -- CHL IP DIET RECOMMENDATION 03/05/2018 SLP Diet Recommendations Dysphagia 1 (Puree) solids;Nectar thick liquid Liquid Administration via [employer] Medication Administration Crushed with puree Compensations Slow rate;Small sips/bites;Minimize environmental distractions;Monitor for anterior loss;Lingual sweep for clearance of pocketing;Clear throat intermittently Postural Changes Seated  upright at 90 degrees;Remain semi-upright after after feeds/meals (Comment)   CHL IP OTHER RECOMMENDATIONS 03/05/2018 Recommended Consults -- Oral Care Recommendations Oral care before and after PO Other Recommendations Have oral suction available   CHL IP FOLLOW UP RECOMMENDATIONS 03/03/2018 Follow up Recommendations (No Data)   CHL IP FREQUENCY AND DURATION 03/05/2018 Speech Therapy Frequency (ACUTE ONLY) min 2x/week Treatment Duration 2 weeks  CHL IP ORAL PHASE 03/05/2018 Oral Phase Impaired Oral - Pudding Teaspoon -- Oral - Pudding Cup -- Oral - Honey Teaspoon -- Oral - Honey Cup -- Oral - Nectar Teaspoon Left anterior bolus loss;Weak lingual manipulation;Reduced posterior propulsion;Left pocketing in lateral sulci;Pocketing in anterior sulcus;Delayed oral transit;Decreased bolus cohesion;Premature spillage Oral - Nectar Cup -- Oral - Nectar Straw Left anterior bolus loss;Weak lingual manipulation;Reduced posterior propulsion;Left pocketing in lateral sulci;Pocketing in anterior sulcus;Delayed oral transit;Decreased bolus cohesion;Premature spillage Oral - Thin Teaspoon -- Oral - Thin Cup -- Oral - Thin Straw Left anterior bolus loss;Weak lingual manipulation;Reduced posterior propulsion;Left pocketing in lateral sulci;Pocketing in anterior sulcus;Delayed oral transit;Decreased bolus cohesion;Premature spillage Oral - Puree Left anterior bolus loss;Weak lingual manipulation;Reduced posterior propulsion;Left pocketing in lateral sulci;Pocketing in anterior sulcus;Delayed oral transit;Decreased bolus cohesion;Premature spillage Oral - Mech Soft -- Oral - Regular -- Oral - Multi-Consistency -- Oral - Pill -- Oral Phase - Comment --  CHL IP PHARYNGEAL PHASE 03/05/2018 Pharyngeal Phase Impaired Pharyngeal- Pudding Teaspoon -- Pharyngeal -- Pharyngeal- Pudding Cup -- Pharyngeal -- Pharyngeal- Honey Teaspoon -- Pharyngeal -- Pharyngeal- Honey Cup -- Pharyngeal -- Pharyngeal- Nectar Teaspoon Delayed swallow  initiation-pyriform sinuses Pharyngeal -- Pharyngeal- Nectar Cup -- Pharyngeal -- Pharyngeal- Nectar Straw Delayed swallow initiation-pyriform sinuses;Penetration/Apiration after swallow;Pharyngeal residue - valleculae;Pharyngeal residue - pyriform Pharyngeal Material enters airway, CONTACTS cords and then ejected out;Material does not enter airway Pharyngeal- Thin Teaspoon -- Pharyngeal -- Pharyngeal- Thin Cup -- Pharyngeal -- Pharyngeal- Thin Straw Delayed swallow initiation-pyriform sinuses;Pharyngeal residue - valleculae;Pharyngeal residue - pyriform;Penetration/Aspiration before swallow;Trace aspiration Pharyngeal Material enters airway, passes BELOW cords then ejected out Pharyngeal- Puree Delayed swallow initiation-vallecula Pharyngeal -- Pharyngeal- Mechanical Soft -- Pharyngeal -- Pharyngeal- Regular -- Pharyngeal -- Pharyngeal- Multi-consistency -- Pharyngeal -- Pharyngeal- Pill -- Pharyngeal -- Pharyngeal Comment --  No flowsheet data found. Herbie Baltimore, MA CCC-SLP Acute Rehabilitation Services Pager [phone] Office [phone] Lynann Beaver 03/05/2018, 10:35 AM              Ct Head Code Stroke Wo Contrast  Result Date: 03/02/2018 CLINICAL DATA:  Code stroke.  Left-sided weakness EXAM: CT HEAD WITHOUT CONTRAST TECHNIQUE: Contiguous axial images were obtained from the base of the skull through the vertex without intravenous contrast. COMPARISON:  None. FINDINGS: Brain: There is no mass, hemorrhage or extra-axial collection. The size and configuration of the ventricles and extra-axial CSF spaces are normal. There is hypoattenuation within the posterior right MCA territory suggesting acute or early subacute infarct. No midline shift or other mass effect. 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 Vascular: Mild hyperattenuation within the right MCA M2 segment. Bilateral distal ICA atherosclerotic calcification. Skull: The visualized  skull base, calvarium and extracranial soft tissues are normal. Sinuses/Orbits: No fluid levels or advanced mucosal thickening of the visualized paranasal sinuses. No mastoid or middle ear effusion. The orbits are normal. ASPECTS [facility] Stroke Program Early CT Score) - Ganglionic level infarction (caudate, lentiform nuclei, internal capsule, insula, M1-M3 cortex): 5 - Supraganglionic infarction (M4-M6 cortex): 1 Total score (0-10 with 10 being normal): 6 IMPRESSION: 1. No acute hemorrhage. 2. Hypoattenuation in the posterior right MCA territory consistent with acute or early subacute infarct. 3. ASPECTS is 6. These results were called by telephone at the time of interpretation on 03/02/2018 at 5:47 pm to Dr. Daleen Bo , who verbally acknowledged these results. Electronically Signed   By: Ulyses Jarred M.D.   On: 03/02/2018 17:53    YWV:PXTGGY fibrillation, rate  132 (personally reviewed)  TELEMETRY: AF with RVR, pauses during AF of up to 2.4 seconds, SR (personally reviewed)  Assessment/Plan: 1.  Persistent atrial fibrillation with RVR Rates difficult to control despite BB and CCB Thankfully, she is asymptomatic with RVR as well as pauses She has had RVR in the past associated with pain and she is having discomfort now from left arm.   As she is asymptomatic, would continue current therapy and evaluate further as an outpatient once she recovers from acute stroke. She may benefit from a rhythm control strategy going forward once recovered from stroke and able to be anticoagulated.   PA pressure is 60 - consider sleep study as an outpatient CHADS2VASC is 7  2.  Left arm swelling Ultrasound pending  3.  Acute CVA Hold anticoagulation for 1 week per neurology Plans noted to change Xarelto to Eliquis (at home she was not taking Xarelto with a meal)      For questions or updates, please contact [facility] Please consult [email or web address] for contact info under  Cardiology/STEMI.  Signed, Chanetta Marshall, NP 03/10/2018 12:25 PM  I have seen, examined the patient, and reviewed the above assessment and plan.  Changes to above are made where necessary.  On exam, iRRR.  Pt with acute stroke and persistent afib.  Currently asymptomatic with RVR and pauses.  Given current medical illness, I would favor a conservative approach.  There is no acute indication for pacing. Hopefully her V rates will continue to improve as her pain/ medical illness improve.   I would advise that we continue her current AV nodal agents, without aggressive titration at this time. Resume anticoagulation when able.  Co Sign: Thompson Grayer, MD 03/10/2018 5:09 PM

## 2018-03-10 NOTE — Progress Notes (Signed)
PROGRESS NOTE    Brenda Hall  HYW:[id number] DOB: [birth year] DOA: 03/02/2018 PCP: [name], MD   Brief Narrative:  HPI On 03/02/2018 by Dr. Zada Finders Brenda Hall is a 84 y.o. female with medical history significant for atrial fibrillation on Xarelto, CAD, history of CVA, hypertension, CKD stage III, and polymyalgia rheumatica who presents to the ED with left-sided weakness, facial droop, and slurred speech.  Time last well-known was around 9 AM.  Patient is able to provide most of recent history.  Her son and daughter-in-law are present during the time of my evaluation and provide additional history.  Patient states she has recently been dealing with a upper respiratory infection which has not been improving with oral azithromycin.  She called her PCP today and was prescribed Augmentin.  Her grandson visited her at home and saw her sitting down in the chair.  Around 4 PM she attempted to get up but was having difficulty lifting her left leg off the floor.  She was also having slurred speech and she was noted to have drooping at the left side of her face.  She says she had eye surgery on her right eye 4 days ago with right eye drooping since that time.  She has been on Xarelto for atrial fibrillation and says she has been taking it every night and has not seen any obvious bleeding.  She also reports recently completing a slow taper off of oral prednisone for treatment of her polymyalgia rheumatica.  She denies any chest pain, palpitations, dyspnea, abdominal pain, or dysuria.  Interim history Admitted for acute CVA.  Neurology consulted and appreciated, work-up completed. Patient was stable for discharge on 11/15/209, however family is appealing discharge. Assessment & Plan   Acute CVA  -presented with left-sided weakness, facial droop and dysarthria -CT head showed changes consistent with acute or early subacute infarction and posterior right MCA territory, no  hemorrhage -CTA head and neck cerebral perfusion study showed short segment occlusion of right MCA proximal M2 -MRI large right MCA territory infarct without hemorrhage or mass-effect. -Echocardiogram EF 65 to 48%, grade 1 diastolic dysfunction.  PA peak pressure of 60 mmHg.  Sclerotic aortic valve, mild TR, moderate to severe pulmonary hypertension. -Hemoglobin A1c 5.5, LDL 65 -Neurology consulted and appreciated, recommended switching Xarelto to Eliquis when patient is able to swallow.  Continue aspirin for now. -Speech recommending dysphagia 1 diet -PT recommended SNF -OT recommended CIR  Paroxysmal atrial fibrillation/atrial fibrillation with RVR -continue metoprolol and cardizem -Cardiology consulted and appreciated, commended increasing metoprolol to 100 mg twice daily as well as Cardizem 30 mg every 6 hours and monitor -Discussed briefly with cardiology, Dr. Oval Linsey this morning, recommended EP consult as patient continues to have pauses and mildly more prolonged. -pending BMP and magnesium this morning -will also order TSH and FT4  Essential hypertension -Amlodipine held for permissive hypertension -back on metoprolol and cardizem for AF   CAD -Continue aspirin  CKD stage III -Creatinine appears to be at baseline, continue to monitor BMP  PMR -Patient follows with Dr. Estanislado Pandy -She has been tapered off of prednisone and started on abaloparatide- which is held for now  Pain -Complains of back pain and states she had back surgery in the past -Continue tramadol PRN  Cough -obtained CXR, reviewed and unremarkable for infection -patient states she was sick 2 weeks prior to admission and had a sore throat  LUE edema -likely due to IV infiltration and IVF -discontinue IV fluid -  elevated arm  Rash -likely heat related, on abdomen/back -no pruritis, hives  DVT Prophylaxis  Heparin  Code Status: Full  Family Communication: None at bedside  Disposition Plan:  Admitted. Workup completed. Patient was discharged on 11/15, however patient's family appealed. Now patient with AF RVR despite medications. Cardiology consulted and following  Consultants Neurology Cardiology   Procedures  Echocardiogram  Antibiotics   Anti-infectives (From admission, onward)   None      Subjective:   Brenda Hall seen and examined today. No complaints this morning.  Denies chest pain, shortness of breath.  Wanting to drink water.  Objective:   Vitals:   03/09/18 1939 03/10/18 0000 03/10/18 0308 03/10/18 0839  BP: 128/69 111/60 126/61 125/78  Pulse: 65 74 64 67  Resp: 20 18 18 20   Temp: 98.4 F (36.9 C) 98.2 F (36.8 C) 98.5 F (36.9 C) (!) 97.4 F (36.3 C)  TempSrc: Oral Oral Oral Oral  SpO2: 96% 97% 96% 98%  Weight:      Height:        Intake/Output Summary (Last 24 hours) at 03/10/2018 1031 Last data filed at 03/09/2018 1800 Gross per 24 hour  Intake 360 ml  Output 400 ml  Net -40 ml   Filed Weights   03/02/18 1724  Weight: 69.6 kg   Exam  General: Well developed, chronically ill appearing, elderly, NAD  HEENT: NCAT,mucous membranes moist.   Neck: Supple  Cardiovascular: S1 S2 auscultated, irregular  Respiratory: Clear to auscultation bilaterally  Abdomen: Soft, nontender, nondistended, + bowel sounds  Extremities: warm dry without cyanosis clubbing  Neuro: AAOx3, left sided weakness  Psych: Appropriate  Data Reviewed: I have personally reviewed following labs and imaging studies  CBC: Recent Labs  Lab 03/04/18 0428 03/06/18 0423 03/08/18 1019  WBC 6.9 9.3  --   HGB 11.3* 11.3* 11.0*  HCT 37.0 37.7 37.5  MCV 88.3 87.7  --   PLT 345 354  --    Basic Metabolic Panel: Recent Labs  Lab 03/04/18 0428 03/05/18 0407 03/06/18 0423 03/07/18 0709 03/08/18 1019  NA 137 139 140 141 137  K 4.4 4.2 3.7 4.0 4.0  CL 106 107 110 115* 111  CO2 20* 18* 18* 19* 19*  GLUCOSE 83 114* 109* 102* 106*  BUN 15 17 15 18 17     CREATININE 1.19* 0.99 1.00 0.97 0.86  CALCIUM 8.4* 8.5* 8.8* 8.4* 8.5*  MG  --   --   --   --  1.7   GFR: Estimated Creatinine Clearance: 46.8 mL/min (by C-G formula based on SCr of 0.86 mg/dL). Liver Function Tests: No results for input(s): AST, ALT, ALKPHOS, BILITOT, PROT, ALBUMIN in the last 168 hours. No results for input(s): LIPASE, AMYLASE in the last 168 hours. No results for input(s): AMMONIA in the last 168 hours. Coagulation Profile: No results for input(s): INR, PROTIME in the last 168 hours. Cardiac Enzymes: No results for input(s): CKTOTAL, CKMB, CKMBINDEX, TROPONINI in the last 168 hours. BNP (last 3 results) No results for input(s): PROBNP in the last 8760 hours. HbA1C: No results for input(s): HGBA1C in the last 72 hours. CBG: No results for input(s): GLUCAP in the last 168 hours. Lipid Profile: No results for input(s): CHOL, HDL, LDLCALC, TRIG, CHOLHDL, LDLDIRECT in the last 72 hours. Thyroid Function Tests: No results for input(s): TSH, T4TOTAL, FREET4, T3FREE, THYROIDAB in the last 72 hours. Anemia Panel: No results for input(s): VITAMINB12, FOLATE, FERRITIN, TIBC, IRON, RETICCTPCT in the last 72 hours.  Urine analysis:    Component Value Date/Time   COLORURINE YELLOW 03/02/2018 1729   APPEARANCEUR HAZY (A) 03/02/2018 1729   LABSPEC 1.023 03/02/2018 1729   PHURINE 8.0 03/02/2018 1729   GLUCOSEU NEGATIVE 03/02/2018 1729   HGBUR NEGATIVE 03/02/2018 1729   BILIRUBINUR NEGATIVE 03/02/2018 1729   KETONESUR NEGATIVE 03/02/2018 1729   PROTEINUR NEGATIVE 03/02/2018 1729   UROBILINOGEN 0.2 07/28/2008 1156   NITRITE NEGATIVE 03/02/2018 1729   LEUKOCYTESUR NEGATIVE 03/02/2018 1729   Sepsis Labs: @LABRCNTIP (procalcitonin:4,lacticidven:4)  )No results found for this or any previous visit (from the past 240 hour(s)).    Radiology Studies: No results found.   Scheduled Meds: . aspirin  300 mg Rectal Daily   Or  . aspirin  325 mg Oral Daily  . atorvastatin   80 mg Oral QHS  . diltiazem  30 mg Oral Q6H  . heparin  5,000 Units Subcutaneous Q8H  . metoprolol tartrate  100 mg Oral BID  . pantoprazole  40 mg Oral q morning - 10a   Continuous Infusions:    LOS: 8 days   Time Spent in minutes   30 minutes  Erykah Lippert D.O. on 03/10/2018 at 10:31 AM  Between 7am to 7pm - Please see pager noted on [email or web address]  After 7pm go to [email or web address]  And look for the night coverage person covering for me after hours  [facility] Office  [phone]

## 2018-03-11 DIAGNOSIS — I4819 Other persistent atrial fibrillation: Secondary | ICD-10-CM

## 2018-03-11 MED ORDER — ENSURE ENLIVE PO LIQD
237.0000 mL | Freq: Two times a day (BID) | ORAL | Status: DC
  Administered 2018-03-11 – 2018-03-13 (×5): 237 mL via ORAL
  Filled 2018-03-11: qty 237

## 2018-03-11 MED ORDER — DILTIAZEM HCL 30 MG PO TABS
30.0000 mg | ORAL_TABLET | Freq: Three times a day (TID) | ORAL | Status: DC
  Administered 2018-03-11 – 2018-03-12 (×4): 30 mg via ORAL
  Filled 2018-03-11 (×4): qty 1

## 2018-03-11 NOTE — Progress Notes (Signed)
  Speech Language Pathology Treatment: Dysphagia;Cognitive-Linquistic  Patient Details Name: Brenda Hall MRN: [id number] DOB: May 11, 1930 Today's Date: 03/11/2018 Time: 0900-0930 SLP Time Calculation (min) (ACUTE ONLY): 30 min  Assessment / Plan / Recommendation Clinical Impression  Sat and visited with pt, addressed pts requests while cueing attention to midline, increased intellectual awareness of deficits including left buccal weakness and sensory impairment, visual deficits and d/c needs. Pt still struggling to balance independent nature with dependence on others for care. She reported waiting on oral care from staff and SLP assisted her with set up and cues, pt able to complete with mod assist and use oral suction appropriately. Tolerated subsequent sips of thin water vis straw (which she demanded) with no immediate cough response. Oral dysphagia with solids still quite severe. Provided trial of regular solids to increase pts awareness of need for puree with some verbalization of understanding given severe pooling on left and inability to form bolus. Overall, pts dysphagia is stable but intake and cooperation with strategies and feeding assist is gradually improving.    HPI HPI: 84 yo female adm to [facility] with left sided weakness (dense hemiplegia), dysarthria and AMS.  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 PMH + for shingles, PMR, TIA, CAD, recent viral infection- s/p Zpac use per pt.  Swallow and speech evaluation ordered.  Pt underwent MBS and is on puree/nectar diet.  Per notes, she wants thin water -       SLP Plan  Continue with current plan of care       Recommendations  Diet recommendations: Dysphagia 1 (puree);Nectar-thick liquid Liquids provided via: Straw;Teaspoon Medication Administration: Via alternative means Supervision: Full supervision/cueing for compensatory strategies;Staff to assist with self feeding Compensations: Slow rate;Small sips/bites;Other (Comment) Postural  Changes and/or Swallow Maneuvers: Seated upright 90 degrees;Upright 30-60 min after meal                Oral Care Recommendations: Oral care QID Follow up Recommendations: Skilled Nursing facility SLP Visit Diagnosis: Dysphagia, oropharyngeal phase (R13.12) Plan: Continue with current plan of care       GO                Brenda Hall, Brenda Hall 03/11/2018, 10:40 AM

## 2018-03-11 NOTE — Progress Notes (Signed)
[facility] PROGRESS NOTE  Brenda Hall ZOX:[id number] DOB: December 16, 1930 DOA: 03/02/2018 PCP: Brenda Kirschner, MD   Narrative: 84 year old female Paroxysmal A. fib chads score >7 on Xarelto NSTEMI 2007 status post BMS Chronic kidney disease stage III IBS HTN Chronic low back pain status post kyphoplasty Polymyalgia rheumatica with neck pain Carotid stenosis Melanoma of the nose back Prior CVA Prior L1 fracture status post kyphoplasty 11/1189 Diastolic heart failure   Admitted to hospital 03/02/2018 with recent upper respiratory infection and found to have left leg weakness, slurred speech and right eye drooping although had surgery previously  Code stroke was initially called neuro telemetry consult consult code stroke-patient was transferred to [place] showed right MCA infarct large on right side echo EF 65-70 with sclerotic aortic valve A1c 5.5 LDL 65 neurology recommending switching to Eliquis when able and dysphagia 1 diet Paroxysmal A. Fib-proving difficult to control-meds had to be held this morning Cardizem await cardiology input regarding further rate control versus rhythm control option patient had some pauses prior to admission but I do not see any today HTN-blood pressures 130s CAD with an STEMI 2007-however will need eliquis in addition to ASA on 11/22 CKD 3-Crest slight up today-likely monitor and no acute chages PMR-cotninue meds Chronic degenerative back pain    DVT prophylaxis: Lovenox code Status: Full code family Communication: None disposition Plan: Inpatient   [name], MD  [facility] Direct contact: [phone] --Via amion app OR  --[email or web address]; password [email or web address]  7PM-7AM contact night coverage as above 03/11/2018, 8:00 AM  LOS: 9 days   Consultants:  Cardiology  Procedures:  No  Antimicrobials:  No  Interval history/Subjective: Awake alert no distress  Objective:  Vitals:   Vitals:   03/11/18 0001 03/11/18 0355  BP: 114/80 (!) 94/58  Pulse: 74 64  Resp: 15 17  Temp: 98.1 F (36.7 C) 97.9 F (36.6 C)  SpO2: 97% 94%    Exam:  No distress EOMI NCAT Very densely weak on left upper and lower extremity unable to move arms very swollen left upper extremity >right Overall weak Chest is clear S1-S2 however tachycardic irregularly irregular Abdomen soft nontender no rebound no guarding I have personally reviewed the following:   Labs: BUN/creatinine 17/0.8-->18/1.1 TSH 0.88, T4 1.5 Imaging studies:  None  Medical tests:  No  Test discussed with performing physician:  No  Decision to obtain old records:  No  Review and summation of old records:  No  Scheduled Meds: . aspirin  300 mg Rectal Daily   Or  . aspirin  325 mg Oral Daily  . atorvastatin  80 mg Oral QHS  . diltiazem  30 mg Oral Q6H  . heparin  5,000 Units Subcutaneous Q8H  . metoprolol tartrate  100 mg Oral BID  . pantoprazole  40 mg Oral q morning - 10a   Continuous Infusions:  Principal Problem:   Cerebrovascular accident (CVA) due to occlusion of right middle cerebral artery (HCC) Active Problems:   Essential hypertension   CAD, NATIVE VESSEL   Polymyalgia rheumatica (HCC)   Atrial fibrillation (HCC)   CKD (chronic kidney disease), stage III (HCC)   Sick sinus syndrome ([place])   LOS: 9 days

## 2018-03-11 NOTE — Progress Notes (Signed)
This Probation officer attempted to feed Brenda Hall lunch with no succerss. However she did drink about 25% of her Ensure. Will continue to encourage pt to increase Po intake. She is currently sleeping in bed in no acute distress. This Probation officer will continue to monitor for safety.

## 2018-03-11 NOTE — Progress Notes (Signed)
Physical Therapy Treatment Patient Details Name: Arlean H Mooney-Riggs MRN: [id number] DOB: 09-06-1930 Today's Date: 03/11/2018    History of Present Illness Pt is a 84 y/o female with PMH significant for atrial fibrillation on Xarelto, CAD, history of CVA, hypertension, CKD stage III, and polymyalgia rheumatica admitted with L sided weakness, facial droop and slurred speech.  Hx of R eye surgery 4 days ago and R eye drooping since. CT reveals acute/early subacute infarct in the posterior R MCA territory. Admitted for further stroke workup.      PT Comments    Pt is progressing slowly given her decreased ability to focus on task and her poor sitting and standing balance due to a skewed sense of vertical.  Emphasis on sitting balance, transfers and upright positioning.   Follow Up Recommendations  SNF;Supervision/Assistance - 24 hour     Equipment Recommendations  None recommended by PT    Recommendations for Other Services       Precautions / Restrictions Precautions Precautions: Fall Restrictions Weight Bearing Restrictions: No    Mobility  Bed Mobility Overal bed mobility: Needs Assistance Bed Mobility: Rolling;Sidelying to Sit Rolling: Max assist Sidelying to sit: Total assist       General bed mobility comments: pt attempting to assist to sit upright.  Transfers Overall transfer level: Needs assistance   Transfers: Stand Pivot Transfers Sit to Stand: Max assist;+2 safety/equipment Stand pivot transfers: Max assist;+2 physical assistance;Total assist          Ambulation/Gait                 Stairs             Wheelchair Mobility    Modified Rankin (Stroke Patients Only) Modified Rankin (Stroke Patients Only) Pre-Morbid Rankin Score: No significant disability Modified Rankin: Severe disability     Balance Overall balance assessment: Needs assistance   Sitting balance-Leahy Scale: Poor Sitting balance - Comments: a little pushing today,  needing minimal external support. Postural control: Left lateral lean Standing balance support: During functional activity Standing balance-Leahy Scale: Poor Standing balance comment: face to face assist to stand with cues for hand placement that ended up generally in a "bear hug" for safety.                            Cognition Arousal/Alertness: Awake/alert Behavior During Therapy: Restless Overall Cognitive Status: Impaired/Different from baseline Area of Impairment: Attention;Memory;Following commands;Safety/judgement;Awareness;Problem solving                   Current Attention Level: Sustained Memory: Decreased recall of precautions;Decreased short-term memory Following Commands: Follows one step commands inconsistently;Follows one step commands with increased time Safety/Judgement: Decreased awareness of safety;Decreased awareness of deficits Awareness: Emergent Problem Solving: Slow processing;Decreased initiation;Difficulty sequencing;Requires verbal cues;Requires tactile cues General Comments: still very distractable, but can be directed to attend to L side more readily.      Exercises Other Exercises Other Exercises: completed head/neck ROM x5 with cues to perform and to hold gentle stretch at end range Other Exercises: performed PROM to LUE across all planes, x10 each    General Comments General comments (skin integrity, edema, etc.): fixed pt a cold thickened apple juice and supervise her sipping the juice without any overt problems.      Pertinent Vitals/Pain Pain Assessment: Faces Faces Pain Scale: Hurts little more Pain Location: neck and L upper arm Pain Descriptors / Indicators: Discomfort;Sore Pain Intervention(s): Monitored during  session;Repositioned    Home Living                      Prior Function            PT Goals (current goals can now be found in the care plan section) Acute Rehab PT Goals Patient Stated Goal:  wants to go home to her cat PT Goal Formulation: With patient Time For Goal Achievement: 03/17/18 Potential to Achieve Goals: Good Progress towards PT goals: Progressing toward goals(general focus slowly progressing)    Frequency    Min 3X/week      PT Plan Current plan remains appropriate    Co-evaluation              AM-PAC PT "6 Clicks" Daily Activity  Outcome Measure  Difficulty turning over in bed (including adjusting bedclothes, sheets and blankets)?: Unable Difficulty moving from lying on back to sitting on the side of the bed? : Unable Difficulty sitting down on and standing up from a chair with arms (e.g., wheelchair, bedside commode, etc,.)?: Unable Help needed moving to and from a bed to chair (including a wheelchair)?: Total Help needed walking in hospital room?: Total Help needed climbing 3-5 steps with a railing? : Total 6 Click Score: 6    End of Session   Activity Tolerance: Patient tolerated treatment well Patient left: in chair;with call bell/phone within reach Nurse Communication: Mobility status PT Visit Diagnosis: Other abnormalities of gait and mobility (R26.89);Muscle weakness (generalized) (M62.81);Hemiplegia and hemiparesis;Pain Hemiplegia - Right/Left: Left Hemiplegia - dominant/non-dominant: Non-dominant Hemiplegia - caused by: Cerebral infarction Pain - Right/Left: Left Pain - part of body: Arm(neck)     Time: 1829-9371 PT Time Calculation (min) (ACUTE ONLY): 34 min  Charges:  $Therapeutic Activity: 23-37 mins                     03/11/2018  [name], PT Acute Rehabilitation Services [phone]  (pager) [phone]  (office)   Tessie Fass Jasime Westergren 03/11/2018, 5:26 PM

## 2018-03-11 NOTE — Progress Notes (Signed)
Occupational Therapy Treatment Patient Details Name: Brenda Hall MRN: [id number] DOB: 11/13/1930 Today's Date: 03/11/2018    History of present illness Pt is a 84 y/o female with PMH significant for atrial fibrillation on Xarelto, CAD, history of CVA, hypertension, CKD stage III, and polymyalgia rheumatica admitted with L sided weakness, facial droop and slurred speech.  Hx of R eye surgery 4 days ago and R eye drooping since. CT reveals acute/early subacute infarct in the posterior R MCA territory. Admitted for further stroke workup.     OT comments  Pt presents supine in bed start of session. Pt requires frequent cues for redirection to tasks during session and pt noted to be intermittently agitated, perseverating on wanting to use ipad throughout session. Pt completing simple face washing task at bed level with minA. Pt with edemous LUE, performing PROM during session and repositioned for further edema control. Pt setup with ipad end of session, is able to scan and select appropriate game, though with significant difficulty accurately playing game at this time (solitaire) despite max cues for initiating/sequencing steps. Continue to recommend SNF level therapies at time of discharge to progress pt towards PLOF. Will continue to follow acutely.    Follow Up Recommendations  SNF;Supervision/Assistance - 24 hour    Equipment Recommendations  Other (comment)(TBD in next venue)          Precautions / Restrictions Restrictions Weight Bearing Restrictions: No                                                     ADL either performed or assessed with clinical judgement   ADL Overall ADL's : Needs assistance/impaired     Grooming: Set up;Minimal assistance;Wash/dry face Grooming Details (indicate cue type and reason): minA for simple face washing task                               General ADL Comments: pt requires frequent redirection and  increased time to follow commands; limited by agitation this session.setup with ipad end of session (pt requesting to play solitaire) - pt able to locate game icon on ipad and deal initial set of cards. Unable to verbalize to thearpist rules of game when asked and with significant difficulty performing correct sequencing/tasks of game despite cues from therapist                       Cognition Arousal/Alertness: Awake/alert Behavior During Therapy: Restless;Anxious;Agitated Overall Cognitive Status: Impaired/Different from baseline Area of Impairment: Attention;Memory;Following commands;Safety/judgement;Awareness;Problem solving                   Current Attention Level: Sustained Memory: Decreased recall of precautions;Decreased short-term memory Following Commands: Follows one step commands inconsistently;Follows one step commands with increased time Safety/Judgement: Decreased awareness of safety;Decreased awareness of deficits Awareness: Emergent Problem Solving: Slow processing;Decreased initiation;Difficulty sequencing;Requires verbal cues;Requires tactile cues General Comments: pt very easily distracted and somewhat agitated this session; perseverating on wanting to use her ipad        Exercises Exercises: Other exercises Other Exercises Other Exercises: completed head/neck ROM x5 with cues to perform and to hold gentle stretch at end range Other Exercises: performed PROM to LUE across all planes, x10 each   Shoulder Instructions  General Comments pt with edemous L UE; repositioned on pillow for further elevation and comfort end of session    Pertinent Vitals/ Pain       Pain Assessment: Faces Faces Pain Scale: Hurts little more Pain Location: neck and L upper arm Pain Descriptors / Indicators: Discomfort;Sore Pain Intervention(s): Monitored during session;Repositioned  Home Living                                          Prior  Functioning/Environment              Frequency  Min 3X/week        Progress Toward Goals  OT Goals(current goals can now be found in the care plan section)  Progress towards OT goals: Progressing toward goals  Acute Rehab OT Goals Patient Stated Goal: wants to go home to her cat OT Goal Formulation: With patient Time For Goal Achievement: 03/17/18 Potential to Achieve Goals: Good ADL Goals Pt Will Perform Grooming: with supervision;sitting;bed level Pt Will Perform Upper Body Bathing: with supervision;sitting Pt/caregiver will Perform Home Exercise Program: Left upper extremity;Increased ROM;Increased strength Additional ADL Goal #1: Pt will complete bed mobility with supervision and maintain sitting balance EOB for 10 minutes with supervision as precursor to ADLs.  Plan Discharge plan remains appropriate;Frequency remains appropriate    Co-evaluation                 AM-PAC PT "6 Clicks" Daily Activity     Outcome Measure   Help from another person eating meals?: Total Help from another person taking care of personal grooming?: A Lot Help from another person toileting, which includes using toliet, bedpan, or urinal?: Total Help from another person bathing (including washing, rinsing, drying)?: Total Help from another person to put on and taking off regular upper body clothing?: Total Help from another person to put on and taking off regular lower body clothing?: Total 6 Click Score: 7    End of Session    OT Visit Diagnosis: Other abnormalities of gait and mobility (R26.89);Muscle weakness (generalized) (M62.81);Other symptoms and signs involving cognitive function;Low vision, both eyes (H54.2);Hemiplegia and hemiparesis;Pain Hemiplegia - Right/Left: Left Hemiplegia - dominant/non-dominant: Non-Dominant Hemiplegia - caused by: Cerebral infarction Pain - part of body: Arm(neck)   Activity Tolerance Patient tolerated treatment well;Treatment limited secondary  to agitation   Patient Left in bed;with call bell/phone within reach;with bed alarm set   Nurse Communication Mobility status        Time: 5027-7412 OT Time Calculation (min): 17 min  Charges: OT General Charges $OT Visit: 1 Visit OT Treatments $Self Care/Home Management : 8-22 mins  Lou Cal, OT Supplemental Rehabilitation Services Pager [phone] Office [phone]    Raymondo Band 03/11/2018, 3:49 PM

## 2018-03-11 NOTE — Progress Notes (Signed)
Progress Note  Patient Name: Brenda Hall Date of Encounter: 03/11/2018  Primary Cardiologist: Carlyle Dolly, MD   Subjective   No cardiac complaints.  She is frustrated about her stroke.   Inpatient Medications    Scheduled Meds: . aspirin  300 mg Rectal Daily   Or  . aspirin  325 mg Oral Daily  . atorvastatin  80 mg Oral QHS  . diltiazem  30 mg Oral Q8H  . feeding supplement (ENSURE ENLIVE)  237 mL Oral BID BM  . heparin  5,000 Units Subcutaneous Q8H  . metoprolol tartrate  100 mg Oral BID  . pantoprazole  40 mg Oral q morning - 10a   Continuous Infusions:  PRN Meds: acetaminophen **OR** acetaminophen (TYLENOL) oral liquid 160 mg/5 mL **OR** acetaminophen, diphenhydrAMINE, hydrALAZINE, metoprolol tartrate, phenol, RESOURCE THICKENUP CLEAR, senna-docusate, traMADol   Vital Signs    Vitals:   03/10/18 2008 03/11/18 0001 03/11/18 0355 03/11/18 0800  BP: 117/80 114/80 (!) 94/58 133/88  Pulse: 64 74 64 89  Resp: 18 15 17 19   Temp: (!) 97.5 F (36.4 C) 98.1 F (36.7 C) 97.9 F (36.6 C) 97.8 F (36.6 C)  TempSrc: Oral Oral Oral Axillary  SpO2: 97% 97% 94% 97%  Weight:      Height:       No intake or output data in the 24 hours ending 03/11/18 1137 Filed Weights   03/02/18 1724  Weight: 69.6 kg    Telemetry    Atrial fibrillation.  Rate 90-120s.  Pauses up to 1.7 sec. - Personally Reviewed  ECG    n/a - Personally Reviewed  Physical Exam   VS:  BP 133/88 (BP Location: Left Arm)   Pulse 89   Temp 97.8 F (36.6 C) (Axillary)   Resp 19   Ht 5\' 5"  (1.651 m)   Wt 69.6 kg   SpO2 97%   BMI 25.53 kg/m  , BMI Body mass index is 25.53 kg/m. GENERAL:  Chronically ill-appearing.  Coughs after drinking.  HEENT: Pupils equal round and reactive, fundi not visualized, oral mucosa unremarkable NECK:  No jugular venous distention, waveform within normal limits, carotid upstroke brisk and symmetric, no bruits LUNGS:  Clear to auscultation  bilaterally HEART:  Irregularly irregular.  PMI not displaced or sustained,S1 and S2 within normal limits, no S3, no S4, no clicks, no rubs, no murmurs ABD:  Flat, positive bowel sounds normal in frequency in pitch, no bruits, no rebound, no guarding, no midline pulsatile mass, no hepatomegaly, no splenomegaly EXT:  2 plus pulses throughout.  + LUE edema, no cyanosis no clubbing SKIN:  No rashes no nodules NEURO:  Cranial nerves II through XII grossly intact, L sided paralysis PSYCH:  Cognitively intact, oriented to person place and time   Labs    Chemistry Recent Labs  Lab 03/07/18 0709 03/08/18 1019 03/10/18 1033  NA 141 137 138  K 4.0 4.0 4.0  CL 115* 111 110  CO2 19* 19* 21*  GLUCOSE 102* 106* 111*  BUN 18 17 18   CREATININE 0.97 0.86 1.18*  CALCIUM 8.4* 8.5* 8.8*  GFRNONAA 52* 60* 41*  GFRAA >60 >60 47*  ANIONGAP 7 7 7      Hematology Recent Labs  Lab 03/06/18 0423 03/08/18 1019  WBC 9.3  --   RBC 4.30  --   HGB 11.3* 11.0*  HCT 37.7 37.5  MCV 87.7  --   MCH 26.3  --   MCHC 30.0  --   RDW  15.6*  --   PLT 354  --     Cardiac EnzymesNo results for input(s): TROPONINI in the last 168 hours. No results for input(s): TROPIPOC in the last 168 hours.   BNPNo results for input(s): BNP, PROBNP in the last 168 hours.   DDimer No results for input(s): DDIMER in the last 168 hours.   Radiology    Vas [place] Upper Extremity Venous Duplex  Result Date: 03/10/2018 UPPER VENOUS STUDY  Indications: Swelling Performing Technologist: Oliver Hum RVT  Examination Guidelines: A complete evaluation includes B-mode imaging, spectral Doppler, color Doppler, and power Doppler as needed of all accessible portions of each vessel. Bilateral testing is considered an integral part of a complete examination. Limited examinations for reoccurring indications may be performed as noted.  Right Findings: +----------+------------+----------+---------+-----------+-------+ RIGHT      CompressiblePropertiesPhasicitySpontaneousSummary +----------+------------+----------+---------+-----------+-------+ Subclavian    Full                 Yes       Yes            +----------+------------+----------+---------+-----------+-------+  Left Findings: +----------+------------+----------+---------+-----------+-----------------+ LEFT      CompressiblePropertiesPhasicitySpontaneous     Summary      +----------+------------+----------+---------+-----------+-----------------+ IJV           Full                 Yes       Yes                      +----------+------------+----------+---------+-----------+-----------------+ Subclavian    Full                 Yes       Yes                      +----------+------------+----------+---------+-----------+-----------------+ Axillary      Full                 Yes       Yes                      +----------+------------+----------+---------+-----------+-----------------+ Brachial      Full                 Yes       Yes                      +----------+------------+----------+---------+-----------+-----------------+ Radial        Full                                                    +----------+------------+----------+---------+-----------+-----------------+ Ulnar         Full                                                    +----------+------------+----------+---------+-----------+-----------------+ Cephalic    Partial                                 Age Indeterminate +----------+------------+----------+---------+-----------+-----------------+ Basilic       Full                                                    +----------+------------+----------+---------+-----------+-----------------+  Summary:  Right: No evidence of thrombosis in the subclavian.  Left: No evidence of deep vein thrombosis in the upper extremity. Findings consistent with age indeterminate superficial vein thrombosis involving  the left cephalic vein.  *See table(s) above for measurements and observations.  Diagnosing physician: Deitra Mayo MD Electronically signed by Deitra Mayo MD on 03/10/2018 at 3:48:38 PM.    Final     Cardiac Studies   Echo 02/2018:  Study Conclusions  - Left ventricle: The cavity size was normal. Wall thickness was   increased in a pattern of mild LVH. There was mild focal basal   hypertrophy of the septum. Systolic function was vigorous. The   estimated ejection fraction was in the range of 65% to 70%. Wall   motion was normal; there were no regional wall motion   abnormalities. Doppler parameters are consistent with abnormal   left ventricular relaxation (grade 1 diastolic dysfunction). - Pulmonary arteries: Systolic pressure was moderately to severely   increased. PA peak pressure: 60 mm Hg (S).  Impressions:  - Vigorous LV systolic function; mild LVH; mild diastolic   dysfunction; sclerotic aortic valve; mild TR; moderate to severe   pulmonary hypertension.  Patient Profile     Ms. Hall is an 13F with CAD s/p NSTEMI and BMS, PAF on Xarelto, CKD 3, HTN, HL and PMR here with acute stroke and atrial fibrillation with RVR.  Assessment & Plan    # Atrial fibrillation with RVR: # Sick sinus syndrome: Rates remain poorly controlled after increasing diltiazem and metoprolol but are better.  She continues to have pauses up to 1.7 seconds.   She remains asymptomatic.  Given her lack of symptoms and comorbidities EP recommends not pursuing pacemaker at this time.  We will continue with rate control.  Diltiazem was held this morning due to  low blood pressure.  We will reduce the dose back to every 8 hours.  Continue metoprolol as scheduled.  Anticoagulation is on hold for her stroke.  Continue with full-strength aspirin for now.  Continue diltiazem and metoprolol.  # CAD: Not an active issue.  Continue aspirin, metoprolol, and atorvastatin.  #  Stroke: Anticoagulation on hold for 1 week.  She is considered a Xarelto failure, though she may have not been taking it with a heavy meal as instructed.  The plan is to switch to Eliquis once she is able to resume anticoagulation.  # L UE edema: L UE Doppler showed no deep vein thrombosis but did show an age-indeterminate superficial vein thrombosis of the left cephalic vein.  She is unable to be anticoagulated at this time due to her stroke.  Anticoagulation will be resumed in 1 week as above.  Given that it is not a DVT she is at lower risk of pulmonary embolism.       For questions or updates, please contact [place] Please consult [email or web address] for contact info under        Signed, Skeet Latch, MD  03/11/2018, 11:37 AM

## 2018-03-12 LAB — BASIC METABOLIC PANEL
Anion gap: 10 (ref 5–15)
BUN: 6 mg/dL — AB (ref 8–23)
CALCIUM: 8.7 mg/dL — AB (ref 8.9–10.3)
CO2: 23 mmol/L (ref 22–32)
CREATININE: 0.87 mg/dL (ref 0.44–1.00)
Chloride: 103 mmol/L (ref 98–111)
GFR calc Af Amer: >60 mL/min (ref >60)
GFR, EST NON AFRICAN AMERICAN: 59 mL/min — AB (ref >60)
GLUCOSE: 94 mg/dL (ref 70–99)
Potassium: 3.4 mmol/L — ABNORMAL LOW (ref 3.5–5.1)
Sodium: 136 mmol/L (ref 135–145)

## 2018-03-12 LAB — CBC WITH DIFFERENTIAL/PLATELET
Abs Immature Granulocytes: 0.14 10*3/uL — ABNORMAL HIGH (ref 0.00–0.07)
BASOS PCT: 0 %
Basophils Absolute: 0.1 10*3/uL (ref 0.0–0.1)
EOS ABS: 0 10*3/uL (ref 0.0–0.5)
Eosinophils Relative: 0 %
HCT: 38.5 % (ref 36.0–46.0)
Hemoglobin: 12.7 g/dL (ref 12.0–15.0)
Immature Granulocytes: 1 %
LYMPHS ABS: 4.1 10*3/uL — AB (ref 0.7–4.0)
Lymphocytes Relative: 17 %
MCH: 27.3 pg (ref 26.0–34.0)
MCHC: 33 g/dL (ref 30.0–36.0)
MCV: 82.8 fL (ref 80.0–100.0)
MONOS PCT: 7 %
Monocytes Absolute: 1.7 10*3/uL — ABNORMAL HIGH (ref 0.1–1.0)
Neutro Abs: 18.2 10*3/uL — ABNORMAL HIGH (ref 1.7–7.7)
Neutrophils Relative %: 75 %
PLATELETS: 268 10*3/uL (ref 150–400)
RBC: 4.65 MIL/uL (ref 3.87–5.11)
RDW: 12.4 % (ref 11.5–15.5)
WBC: 24.2 10*3/uL — ABNORMAL HIGH (ref 4.0–10.5)
nRBC: 0 % (ref 0.0–0.2)

## 2018-03-12 MED ORDER — HEPARIN SODIUM (PORCINE) 5000 UNIT/ML IJ SOLN
5000.0000 [IU] | Freq: Three times a day (TID) | INTRAMUSCULAR | Status: AC
  Administered 2018-03-12: 5000 [IU] via SUBCUTANEOUS
  Filled 2018-03-12: qty 1

## 2018-03-12 MED ORDER — AMIODARONE HCL 200 MG PO TABS
200.0000 mg | ORAL_TABLET | Freq: Two times a day (BID) | ORAL | Status: DC
  Administered 2018-03-12 – 2018-03-13 (×4): 200 mg via ORAL
  Filled 2018-03-12 (×4): qty 1

## 2018-03-12 MED ORDER — APIXABAN 5 MG PO TABS
5.0000 mg | ORAL_TABLET | Freq: Two times a day (BID) | ORAL | Status: DC
  Administered 2018-03-13 (×2): 5 mg via ORAL
  Filled 2018-03-12 (×2): qty 1

## 2018-03-12 NOTE — Care Management Important Message (Signed)
Important Message  Patient Details  Name: Brenda Hall MRN: [id number] Date of Birth: Aug 16, 1930   Medicare Important Message Given:  Yes    Detria Cummings Montine Circle 03/12/2018, 2:58 PM

## 2018-03-12 NOTE — Progress Notes (Signed)
The patient consumed <50% of lunch and <25% of dinner. Patient states "I don't want anything". Will continue to monitor and encourage PO intake of food and fluids.

## 2018-03-12 NOTE — Progress Notes (Signed)
Progress Note  Patient Name: Brenda Hall Date of Encounter: 03/12/2018  Primary Cardiologist: Carlyle Dolly, MD   Subjective   Wants to go home.  Feels well physically.   Inpatient Medications    Scheduled Meds: . aspirin  300 mg Rectal Daily   Or  . aspirin  325 mg Oral Daily  . atorvastatin  80 mg Oral QHS  . diltiazem  30 mg Oral Q8H  . feeding supplement (ENSURE ENLIVE)  237 mL Oral BID BM  . heparin  5,000 Units Subcutaneous Q8H  . metoprolol tartrate  100 mg Oral BID  . pantoprazole  40 mg Oral q morning - 10a   Continuous Infusions:  PRN Meds: acetaminophen **OR** acetaminophen (TYLENOL) oral liquid 160 mg/5 mL **OR** acetaminophen, diphenhydrAMINE, hydrALAZINE, metoprolol tartrate, phenol, RESOURCE THICKENUP CLEAR, senna-docusate, traMADol   Vital Signs    Vitals:   03/11/18 1936 03/11/18 2330 03/12/18 0604 03/12/18 0815  BP: 132/68 (!) 110/58 112/60 (!) 135/101  Pulse: 86 73  74  Resp: 16 18  20   Temp: 97.8 F (36.6 C) 97.7 F (36.5 C)  99.1 F (37.3 C)  TempSrc: Oral Oral  Oral  SpO2: 97% 99%  99%  Weight:      Height:        Intake/Output Summary (Last 24 hours) at 03/12/2018 1055 Last data filed at 03/12/2018 0600 Gross per 24 hour  Intake 240 ml  Output 100 ml  Net 140 ml   Filed Weights   03/02/18 1724  Weight: 69.6 kg    Telemetry    Atrial fibrillation.  Rate 100s-130s.  Pauses up to 2 sec. - Personally Reviewed  ECG    n/a - Personally Reviewed  Physical Exam   VS:  BP (!) 135/101 (BP Location: Left Arm) Comment: Pt kept moving arm  Pulse 74   Temp 99.1 F (37.3 C) (Oral)   Resp 20   Ht 5\' 5"  (1.651 m)   Wt 69.6 kg   SpO2 99%   BMI 25.53 kg/m  , BMI Body mass index is 25.53 kg/m. GENERAL:  Chronically ill-appearing.  Coughs after drinking.  HEENT: Pupils equal round and reactive, fundi not visualized, oral mucosa unremarkable NECK:  No jugular venous distention, waveform within normal limits, carotid  upstroke brisk and symmetric, no bruits LUNGS:  Clear to auscultation bilaterally HEART:  Irregularly irregular.  PMI not displaced or sustained,S1 and S2 within normal limits, no S3, no S4, no clicks, no rubs, no murmurs ABD:  Flat, positive bowel sounds normal in frequency in pitch, no bruits, no rebound, no guarding, no midline pulsatile mass, no hepatomegaly, no splenomegaly EXT:  2 plus pulses throughout.  + LUE edema, no cyanosis no clubbing SKIN:  No rashes no nodules NEURO:  Cranial nerves II through XII grossly intact, L sided paralysis PSYCH:  Cognitively intact, oriented to person place and time   Labs    Chemistry Recent Labs  Lab 03/08/18 1019 03/10/18 1033 03/12/18 0331  NA 137 138 136  K 4.0 4.0 3.4*  CL 111 110 103  CO2 19* 21* 23  GLUCOSE 106* 111* 94  BUN 17 18 6*  CREATININE 0.86 1.18* 0.87  CALCIUM 8.5* 8.8* 8.7*  GFRNONAA 60* 41* 59*  GFRAA >60 47* >60  ANIONGAP 7 7 10      Hematology Recent Labs  Lab 03/06/18 0423 03/08/18 1019 03/12/18 0331  WBC 9.3  --  24.2*  RBC 4.30  --  4.65  HGB 11.3*  11.0* 12.7  HCT 37.7 37.5 38.5  MCV 87.7  --  82.8  MCH 26.3  --  27.3  MCHC 30.0  --  33.0  RDW 15.6*  --  12.4  PLT 354  --  268    Cardiac EnzymesNo results for input(s): TROPONINI in the last 168 hours. No results for input(s): TROPIPOC in the last 168 hours.   BNPNo results for input(s): BNP, PROBNP in the last 168 hours.   DDimer No results for input(s): DDIMER in the last 168 hours.   Radiology    Vas [place] Upper Extremity Venous Duplex  Result Date: 03/10/2018 UPPER VENOUS STUDY  Indications: Swelling Performing Technologist: Oliver Hum RVT  Examination Guidelines: A complete evaluation includes B-mode imaging, spectral Doppler, color Doppler, and power Doppler as needed of all accessible portions of each vessel. Bilateral testing is considered an integral part of a complete examination. Limited examinations for reoccurring indications may  be performed as noted.  Right Findings: +----------+------------+----------+---------+-----------+-------+ RIGHT     CompressiblePropertiesPhasicitySpontaneousSummary +----------+------------+----------+---------+-----------+-------+ Subclavian    Full                 Yes       Yes            +----------+------------+----------+---------+-----------+-------+  Left Findings: +----------+------------+----------+---------+-----------+-----------------+ LEFT      CompressiblePropertiesPhasicitySpontaneous     Summary      +----------+------------+----------+---------+-----------+-----------------+ IJV           Full                 Yes       Yes                      +----------+------------+----------+---------+-----------+-----------------+ Subclavian    Full                 Yes       Yes                      +----------+------------+----------+---------+-----------+-----------------+ Axillary      Full                 Yes       Yes                      +----------+------------+----------+---------+-----------+-----------------+ Brachial      Full                 Yes       Yes                      +----------+------------+----------+---------+-----------+-----------------+ Radial        Full                                                    +----------+------------+----------+---------+-----------+-----------------+ Ulnar         Full                                                    +----------+------------+----------+---------+-----------+-----------------+ Cephalic    Partial  Age Indeterminate +----------+------------+----------+---------+-----------+-----------------+ Basilic       Full                                                    +----------+------------+----------+---------+-----------+-----------------+  Summary:  Right: No evidence of thrombosis in the subclavian.  Left: No evidence of deep vein  thrombosis in the upper extremity. Findings consistent with age indeterminate superficial vein thrombosis involving the left cephalic vein.  *See table(s) above for measurements and observations.  Diagnosing physician: Deitra Mayo MD Electronically signed by Deitra Mayo MD on 03/10/2018 at 3:48:38 PM.    Final     Cardiac Studies   Echo 02/2018:  Study Conclusions  - Left ventricle: The cavity size was normal. Wall thickness was   increased in a pattern of mild LVH. There was mild focal basal   hypertrophy of the septum. Systolic function was vigorous. The   estimated ejection fraction was in the range of 65% to 70%. Wall   motion was normal; there were no regional wall motion   abnormalities. Doppler parameters are consistent with abnormal   left ventricular relaxation (grade 1 diastolic dysfunction). - Pulmonary arteries: Systolic pressure was moderately to severely   increased. PA peak pressure: 60 mm Hg (S).  Impressions:  - Vigorous LV systolic function; mild LVH; mild diastolic   dysfunction; sclerotic aortic valve; mild TR; moderate to severe   pulmonary hypertension.  Patient Profile     Ms. Hall is an 49F with CAD s/p NSTEMI and BMS, PAF on Xarelto, CKD 3, HTN, HL and PMR here with acute stroke and atrial fibrillation with RVR.  Assessment & Plan    # Atrial fibrillation with RVR: # Sick sinus syndrome: Rates remain poorly controlled after increasing diltiazem and metoprolol but are better.  Had to reduce diltiazem 2/2 low BP.  HR is back in the 130/140s at times.  We will try adding amiodarone 200mg  bid. Plan for 5g load.  Continue metoprolol and diltiazem.  Anticoagulation is on hold for her stroke.  Continue with full-strength aspirin for now.    # CAD: Not an active issue.  Continue aspirin, metoprolol, and atorvastatin.  # Stroke: Anticoagulation on hold for 1 week.  She is considered a Xarelto failure, though she may have not been  taking it with a heavy meal as instructed.  The plan is to switch to Eliquis once she is able to resume anticoagulation.  # L UE edema: L UE Doppler showed no deep vein thrombosis but did show an age-indeterminate superficial vein thrombosis of the left cephalic vein.  She is unable to be anticoagulated at this time due to her stroke.  Anticoagulation will be resumed in 1 week as above.  Given that it is not a DVT she is at lower risk of pulmonary embolism.       For questions or updates, please contact [place] Please consult [email or web address] for contact info under        Signed, Skeet Latch, MD  03/12/2018, 10:55 AM

## 2018-03-12 NOTE — Progress Notes (Signed)
[facility] PROGRESS NOTE  Edynn H Mooney-Riggs BSW:[id number] DOB: [birth year] DOA: 03/02/2018 PCP: [name], MD   Narrative: 84 year old female Paroxysmal A. fib chads score >7 on Xarelto NSTEMI 2007 status post BMS Chronic kidney disease stage III IBS HTN Chronic low back pain status post kyphoplasty Polymyalgia rheumatica with neck pain Carotid stenosis Melanoma of the nose back Prior CVA Prior L1 fracture status post kyphoplasty 07/6657 Diastolic heart failure   Admitted to hospital 03/02/2018 with recent upper respiratory infection and found to have left leg weakness, slurred speech and right eye drooping although had surgery previously  Code stroke was initially called neuro telemetry consult consult code stroke-patient was transferred to [facility] showed right MCA infarct large on right side echo EF 65-70 with sclerotic aortic valve A1c 5.5 LDL 65-will need Eliquis probably starting 11/22 continue aspirin 325 Paroxysmal A. Fib-proving difficult to control-meds soon as per cardiology-had pauses-starting amiodarone, continue Cardizem 30 every 8, metoprolol 100 twice daily-keep on telemetry and monitor Leukocytosis no overt etiologies other than may be superficial phlebitis-monitor repeat labs in am HTN-blood pressures 130s CAD with an STEMI 2007-however will need eliquis in addition to ASA on 11/22 CKD 3-Creat is improved to some degree PMR-cotninue meds Chronic degenerative back pain    DVT prophylaxis: Lovenox code Status: Full code family Communication: None disposition Plan: Inpatient   Seva Chancy, MD  [facility] Direct contact: [phone] --Via amion app OR  --[email or web address]; password [email or web address]  7PM-7AM contact night coverage as above 03/12/2018, 1:13 PM  LOS: 10 days   Consultants:  Cardiology  Procedures:  No  Antimicrobials:  No  Interval history/Subjective:  No distress awake alert eating  drinking despondent about stroke and weakness on the left side Aware of pain in the chest or unpleasant sensation of palpitation  Objective:  Vitals:  Vitals:   03/12/18 0815 03/12/18 1123  BP: (!) 135/101 (!) 132/104  Pulse: 74 74  Resp: 20   Temp: 99.1 F (37.3 C) 98.6 F (37 C)  SpO2: 99% 94%    Exam:  No distress EOMI NCAT Dense weakness on left side swelling on left arm is a little better Overall weak Chest is clear S1-S2 however tachycardic irregularly irregular Abdomen soft nontender no rebound no guarding I have personally reviewed the following:   Labs: BUN/creatinine 17/0.8-->18/1.1-->6/0.8 WBC 24 TSH 0.88, T4 1.5 Imaging studies:  None  Medical tests:  No  Test discussed with performing physician:  No  Decision to obtain old records:  No  Review and summation of old records:  No  Scheduled Meds: . amiodarone  200 mg Oral BID  . aspirin  300 mg Rectal Daily   Or  . aspirin  325 mg Oral Daily  . atorvastatin  80 mg Oral QHS  . diltiazem  30 mg Oral Q8H  . feeding supplement (ENSURE ENLIVE)  237 mL Oral BID BM  . heparin  5,000 Units Subcutaneous Q8H  . metoprolol tartrate  100 mg Oral BID  . pantoprazole  40 mg Oral q morning - 10a   Continuous Infusions:  Principal Problem:   Cerebrovascular accident (CVA) due to occlusion of right middle cerebral artery (HCC) Active Problems:   Essential hypertension   CAD, NATIVE VESSEL   Polymyalgia rheumatica (HCC)   Atrial fibrillation (HCC)   CKD (chronic kidney disease), stage III (HCC)   Sick sinus syndrome ([place])   LOS: 10 days

## 2018-03-12 NOTE — Progress Notes (Signed)
Pt consumed <25% of breakfast after multiple attempts to feed. Pt refused ensure states "I am not hungry I don't want anything". Will continue to encourage PO intake.

## 2018-03-12 NOTE — Progress Notes (Signed)
  Speech Language Pathology Treatment: Dysphagia  Patient Details Name: Brenda Hall MRN: [id number] DOB: 19-Nov-1930 Today's Date: 03/12/2018 Time: 2876-8115 SLP Time Calculation (min) (ACUTE ONLY): 45 min  Assessment / Plan / Recommendation Clinical Impression  Pt demonstrates ongoing deficits including easy distractibility , poor awareness and severe oral dysphagia. Assisted son and pt in reducing distraction on tray and in environment to improve pt focus on PO intake.  Pt did much better after the son left given less stimulation in the environment and less desire for the pt to participate in conversation.  Regardless, pt fatigued easily and intake with max effort was about 15%. Pt is very successful with water after oral care. Pt is stable and has achieved significant improvement in ability to participate though primary deficits persist.    HPI HPI: 84 yo female adm to [facility] with left sided weakness (dense hemiplegia), dysarthria and AMS.  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 PMH + for shingles, PMR, TIA, CAD, recent viral infection- s/p Zpac use per pt.  Swallow and speech evaluation ordered.  Pt underwent MBS and is on puree/nectar diet.  Per notes, she wants thin water -       SLP Plan  Continue with current plan of care       Recommendations  Diet recommendations: Dysphagia 1 (puree);Nectar-thick liquid(water protocol) Liquids provided via: Straw;Teaspoon Medication Administration: Via alternative means Supervision: Full supervision/cueing for compensatory strategies;Staff to assist with self feeding Compensations: Slow rate;Small sips/bites;Other (Comment) Postural Changes and/or Swallow Maneuvers: Seated upright 90 degrees;Upright 30-60 min after meal                Oral Care Recommendations: Oral care before and after PO;Oral care prior to ice chip/H20 Follow up Recommendations: Skilled Nursing facility SLP Visit Diagnosis: Dysphagia, oropharyngeal phase (R13.12) Plan:  Continue with current plan of care       GO               Herbie Baltimore, MA [place] Pager [phone] Office [phone]  Lynann Beaver 03/12/2018, 9:37 AM

## 2018-03-12 NOTE — Consult Note (Signed)
   [facility] CM Inpatient Consult   03/12/2018  Brenda Hall [name] 10, 1932 [id number]  Chart reviewed for post hospital follow up needs with [facility] with Medicare/[employer]. Current review reveals patient is admitted with CVA and now for skilled nursing placement. Will follow for disposition and needs for post hospital follow up.  For questions, please contact:  Natividad Brood, RN BSN [facility] Liaison  [phone] business mobile phone Toll free office [phone]

## 2018-03-12 NOTE — Discharge Instructions (Signed)

## 2018-03-13 ENCOUNTER — Inpatient Hospital Stay
Admission: RE | Admit: 2018-03-13 | Discharge: 2018-04-07 | Disposition: A | Discharge status: Nursing Home (Includes ICF) | Payer: Medicare Other | Source: Ambulatory Visit | Attending: Internal Medicine | Admitting: Internal Medicine

## 2018-03-13 DIAGNOSIS — R0902 Hypoxemia: Secondary | ICD-10-CM | POA: Diagnosis not present

## 2018-03-13 DIAGNOSIS — Z96653 Presence of artificial knee joint, bilateral: Secondary | ICD-10-CM | POA: Diagnosis not present

## 2018-03-13 DIAGNOSIS — A419 Sepsis, unspecified organism: Secondary | ICD-10-CM | POA: Diagnosis present

## 2018-03-13 DIAGNOSIS — Z7401 Bed confinement status: Secondary | ICD-10-CM | POA: Diagnosis not present

## 2018-03-13 DIAGNOSIS — R4781 Slurred speech: Secondary | ICD-10-CM | POA: Diagnosis not present

## 2018-03-13 DIAGNOSIS — M47816 Spondylosis without myelopathy or radiculopathy, lumbar region: Secondary | ICD-10-CM | POA: Diagnosis not present

## 2018-03-13 DIAGNOSIS — M353 Polymyalgia rheumatica: Secondary | ICD-10-CM | POA: Diagnosis not present

## 2018-03-13 DIAGNOSIS — N183 Chronic kidney disease, stage 3 (moderate): Secondary | ICD-10-CM | POA: Diagnosis present

## 2018-03-13 DIAGNOSIS — D649 Anemia, unspecified: Secondary | ICD-10-CM | POA: Diagnosis not present

## 2018-03-13 DIAGNOSIS — M545 Low back pain: Secondary | ICD-10-CM | POA: Diagnosis not present

## 2018-03-13 DIAGNOSIS — I495 Sick sinus syndrome: Secondary | ICD-10-CM

## 2018-03-13 DIAGNOSIS — E876 Hypokalemia: Secondary | ICD-10-CM | POA: Diagnosis present

## 2018-03-13 DIAGNOSIS — M19042 Primary osteoarthritis, left hand: Secondary | ICD-10-CM | POA: Diagnosis not present

## 2018-03-13 DIAGNOSIS — I4819 Other persistent atrial fibrillation: Secondary | ICD-10-CM | POA: Diagnosis not present

## 2018-03-13 DIAGNOSIS — M6281 Muscle weakness (generalized): Secondary | ICD-10-CM | POA: Diagnosis not present

## 2018-03-13 DIAGNOSIS — F329 Major depressive disorder, single episode, unspecified: Secondary | ICD-10-CM | POA: Diagnosis present

## 2018-03-13 DIAGNOSIS — I63511 Cerebral infarction due to unspecified occlusion or stenosis of right middle cerebral artery: Secondary | ICD-10-CM | POA: Diagnosis not present

## 2018-03-13 DIAGNOSIS — I69354 Hemiplegia and hemiparesis following cerebral infarction affecting left non-dominant side: Secondary | ICD-10-CM | POA: Diagnosis not present

## 2018-03-13 DIAGNOSIS — R1312 Dysphagia, oropharyngeal phase: Secondary | ICD-10-CM | POA: Diagnosis not present

## 2018-03-13 DIAGNOSIS — I10 Essential (primary) hypertension: Secondary | ICD-10-CM | POA: Diagnosis not present

## 2018-03-13 DIAGNOSIS — K589 Irritable bowel syndrome without diarrhea: Secondary | ICD-10-CM | POA: Diagnosis not present

## 2018-03-13 DIAGNOSIS — Z741 Need for assistance with personal care: Secondary | ICD-10-CM | POA: Diagnosis not present

## 2018-03-13 DIAGNOSIS — I272 Pulmonary hypertension, unspecified: Secondary | ICD-10-CM | POA: Diagnosis present

## 2018-03-13 DIAGNOSIS — R0602 Shortness of breath: Secondary | ICD-10-CM | POA: Diagnosis not present

## 2018-03-13 DIAGNOSIS — I639 Cerebral infarction, unspecified: Secondary | ICD-10-CM | POA: Diagnosis not present

## 2018-03-13 DIAGNOSIS — I25118 Atherosclerotic heart disease of native coronary artery with other forms of angina pectoris: Secondary | ICD-10-CM | POA: Diagnosis not present

## 2018-03-13 DIAGNOSIS — Y95 Nosocomial condition: Secondary | ICD-10-CM | POA: Diagnosis present

## 2018-03-13 DIAGNOSIS — I129 Hypertensive chronic kidney disease with stage 1 through stage 4 chronic kidney disease, or unspecified chronic kidney disease: Secondary | ICD-10-CM | POA: Diagnosis not present

## 2018-03-13 DIAGNOSIS — J690 Pneumonitis due to inhalation of food and vomit: Secondary | ICD-10-CM | POA: Diagnosis present

## 2018-03-13 DIAGNOSIS — M19041 Primary osteoarthritis, right hand: Secondary | ICD-10-CM | POA: Diagnosis not present

## 2018-03-13 DIAGNOSIS — N179 Acute kidney failure, unspecified: Secondary | ICD-10-CM | POA: Diagnosis present

## 2018-03-13 DIAGNOSIS — R52 Pain, unspecified: Secondary | ICD-10-CM | POA: Diagnosis not present

## 2018-03-13 DIAGNOSIS — R6521 Severe sepsis with septic shock: Secondary | ICD-10-CM | POA: Diagnosis present

## 2018-03-13 DIAGNOSIS — R4182 Altered mental status, unspecified: Secondary | ICD-10-CM | POA: Diagnosis not present

## 2018-03-13 DIAGNOSIS — R531 Weakness: Secondary | ICD-10-CM | POA: Diagnosis not present

## 2018-03-13 DIAGNOSIS — R000 Tachycardia, unspecified: Secondary | ICD-10-CM | POA: Diagnosis not present

## 2018-03-13 DIAGNOSIS — Z515 Encounter for palliative care: Secondary | ICD-10-CM | POA: Diagnosis present

## 2018-03-13 DIAGNOSIS — R279 Unspecified lack of coordination: Secondary | ICD-10-CM | POA: Diagnosis not present

## 2018-03-13 DIAGNOSIS — K921 Melena: Secondary | ICD-10-CM | POA: Diagnosis not present

## 2018-03-13 DIAGNOSIS — M810 Age-related osteoporosis without current pathological fracture: Secondary | ICD-10-CM | POA: Diagnosis not present

## 2018-03-13 DIAGNOSIS — I4891 Unspecified atrial fibrillation: Secondary | ICD-10-CM | POA: Diagnosis not present

## 2018-03-13 DIAGNOSIS — R069 Unspecified abnormalities of breathing: Secondary | ICD-10-CM | POA: Diagnosis not present

## 2018-03-13 DIAGNOSIS — I69391 Dysphagia following cerebral infarction: Secondary | ICD-10-CM | POA: Diagnosis not present

## 2018-03-13 DIAGNOSIS — I5032 Chronic diastolic (congestive) heart failure: Secondary | ICD-10-CM | POA: Diagnosis present

## 2018-03-13 DIAGNOSIS — E559 Vitamin D deficiency, unspecified: Secondary | ICD-10-CM | POA: Diagnosis not present

## 2018-03-13 DIAGNOSIS — I2510 Atherosclerotic heart disease of native coronary artery without angina pectoris: Secondary | ICD-10-CM | POA: Diagnosis not present

## 2018-03-13 DIAGNOSIS — R710 Precipitous drop in hematocrit: Secondary | ICD-10-CM | POA: Diagnosis not present

## 2018-03-13 DIAGNOSIS — M2550 Pain in unspecified joint: Secondary | ICD-10-CM | POA: Diagnosis not present

## 2018-03-13 DIAGNOSIS — J9601 Acute respiratory failure with hypoxia: Secondary | ICD-10-CM | POA: Diagnosis present

## 2018-03-13 DIAGNOSIS — G629 Polyneuropathy, unspecified: Secondary | ICD-10-CM | POA: Diagnosis not present

## 2018-03-13 DIAGNOSIS — E785 Hyperlipidemia, unspecified: Secondary | ICD-10-CM | POA: Diagnosis present

## 2018-03-13 DIAGNOSIS — N390 Urinary tract infection, site not specified: Secondary | ICD-10-CM | POA: Diagnosis present

## 2018-03-13 DIAGNOSIS — Z66 Do not resuscitate: Secondary | ICD-10-CM | POA: Diagnosis present

## 2018-03-13 DIAGNOSIS — L89151 Pressure ulcer of sacral region, stage 1: Secondary | ICD-10-CM | POA: Diagnosis present

## 2018-03-13 DIAGNOSIS — I480 Paroxysmal atrial fibrillation: Secondary | ICD-10-CM | POA: Diagnosis present

## 2018-03-13 DIAGNOSIS — R05 Cough: Secondary | ICD-10-CM | POA: Diagnosis not present

## 2018-03-13 DIAGNOSIS — I130 Hypertensive heart and chronic kidney disease with heart failure and stage 1 through stage 4 chronic kidney disease, or unspecified chronic kidney disease: Secondary | ICD-10-CM | POA: Diagnosis present

## 2018-03-13 DIAGNOSIS — I69392 Facial weakness following cerebral infarction: Secondary | ICD-10-CM | POA: Diagnosis not present

## 2018-03-13 LAB — MAGNESIUM: MAGNESIUM: 1.8 mg/dL (ref 1.7–2.4)

## 2018-03-13 LAB — CBC WITH DIFFERENTIAL/PLATELET
Abs Immature Granulocytes: 0.05 10*3/uL (ref 0.00–0.07)
BASOS ABS: 0.1 10*3/uL (ref 0.0–0.1)
BASOS PCT: 1 %
EOS PCT: 14 %
Eosinophils Absolute: 1.2 10*3/uL — ABNORMAL HIGH (ref 0.0–0.5)
HCT: 35 % — ABNORMAL LOW (ref 36.0–46.0)
HEMOGLOBIN: 10.6 g/dL — AB (ref 12.0–15.0)
Immature Granulocytes: 1 %
LYMPHS ABS: 1.8 10*3/uL (ref 0.7–4.0)
Lymphocytes Relative: 21 %
MCH: 26.8 pg (ref 26.0–34.0)
MCHC: 30.3 g/dL (ref 30.0–36.0)
MCV: 88.6 fL (ref 80.0–100.0)
Monocytes Absolute: 0.7 10*3/uL (ref 0.1–1.0)
Monocytes Relative: 9 %
NEUTROS ABS: 4.7 10*3/uL (ref 1.7–7.7)
NEUTROS PCT: 54 %
PLATELETS: 464 10*3/uL — AB (ref 150–400)
RBC: 3.95 MIL/uL (ref 3.87–5.11)
RDW: 15.3 % (ref 11.5–15.5)
WBC: 8.6 10*3/uL (ref 4.0–10.5)
nRBC: 0 % (ref 0.0–0.2)

## 2018-03-13 LAB — BASIC METABOLIC PANEL
ANION GAP: 7 (ref 5–15)
BUN: 20 mg/dL (ref 8–23)
CALCIUM: 8.5 mg/dL — AB (ref 8.9–10.3)
CO2: 22 mmol/L (ref 22–32)
Chloride: 108 mmol/L (ref 98–111)
Creatinine, Ser: 1.22 mg/dL — ABNORMAL HIGH (ref 0.44–1.00)
GFR calc Af Amer: 45 mL/min — ABNORMAL LOW (ref >60)
GFR, EST NON AFRICAN AMERICAN: 39 mL/min — AB (ref >60)
Glucose, Bld: 98 mg/dL (ref 70–99)
Potassium: 4.1 mmol/L (ref 3.5–5.1)
Sodium: 137 mmol/L (ref 135–145)

## 2018-03-13 MED ORDER — AMIODARONE HCL 200 MG PO TABS: 200.0000 mg | ORAL_TABLET | Freq: Two times a day (BID) | ORAL | 0 refills | Status: DC

## 2018-03-13 MED ORDER — TRAMADOL HCL 50 MG PO TABS: 50.0000 mg | ORAL_TABLET | Freq: Four times a day (QID) | ORAL | 0 refills | Status: DC | PRN

## 2018-03-13 MED ORDER — ELDERTONIC PO LIQD: 15.0000 mL | Freq: Every day | ORAL | Status: DC

## 2018-03-13 MED ORDER — ADULT MULTIVITAMIN W/MINERALS CH
1.0000 | ORAL_TABLET | Freq: Every day | ORAL | Status: DC
  Administered 2018-03-13: 1 via ORAL
  Filled 2018-03-13: qty 1

## 2018-03-13 MED ORDER — APIXABAN 5 MG PO TABS: 5.0000 mg | ORAL_TABLET | Freq: Two times a day (BID) | ORAL | Status: DC

## 2018-03-13 MED ORDER — METOPROLOL TARTRATE 100 MG PO TABS: 100.0000 mg | ORAL_TABLET | Freq: Two times a day (BID) | ORAL | Status: AC

## 2018-03-13 MED ORDER — DILTIAZEM HCL ER 90 MG PO CP12: 90.0000 mg | ORAL_CAPSULE | Freq: Two times a day (BID) | ORAL | 0 refills | Status: DC

## 2018-03-13 MED ORDER — AMIODARONE HCL 200 MG PO TABS: 200.0000 mg | ORAL_TABLET | Freq: Every day | ORAL | 0 refills | Status: DC

## 2018-03-13 MED ORDER — AMLODIPINE BESYLATE 5 MG PO TABS
5.0000 mg | ORAL_TABLET | Freq: Every day | ORAL | Status: DC
  Filled 2018-03-13: qty 1

## 2018-03-13 MED ORDER — RESOURCE THICKENUP CLEAR PO POWD: 1.0000 | ORAL | Status: DC | PRN

## 2018-03-13 NOTE — Plan of Care (Signed)
  Problem: Education: Goal: Knowledge of General Education information will improve Description Including pain rating scale, medication(s)/side effects and non-pharmacologic comfort measures 03/13/2018 0454 by Narda Rutherford, RN Outcome: Progressing 03/13/2018 0453 by Narda Rutherford, RN Outcome: Progressing

## 2018-03-13 NOTE — Progress Notes (Signed)
Progress Note  Patient Name: Brenda Hall Date of Encounter: 03/13/2018  Primary Cardiologist: Brenda Dolly, MD   Subjective   Wants to go home.  Feels well physically.   Inpatient Medications    Scheduled Meds: . amiodarone  200 mg Oral BID  . apixaban  5 mg Oral BID  . atorvastatin  80 mg Oral QHS  . diltiazem  30 mg Oral Q8H  . feeding supplement (ENSURE ENLIVE)  237 mL Oral BID BM  . metoprolol tartrate  100 mg Oral BID  . pantoprazole  40 mg Oral q morning - 10a   Continuous Infusions:  PRN Meds: acetaminophen **OR** acetaminophen (TYLENOL) oral liquid 160 mg/5 mL **OR** acetaminophen, diphenhydrAMINE, hydrALAZINE, metoprolol tartrate, phenol, RESOURCE THICKENUP CLEAR, senna-docusate, traMADol   Vital Signs    Vitals:   03/12/18 1953 03/12/18 2340 03/13/18 0328 03/13/18 0815  BP: (!) 166/51 (!) 151/68 (!) 143/55 (!) 169/72  Pulse: (!) 58 (!) 58 (!) 52 (!) 57  Resp: 17 17 18 19   Temp:  97.9 F (36.6 C) 97.8 F (36.6 C) 97.8 F (36.6 C)  TempSrc:  Oral Oral Oral  SpO2: 97% 98% 98% 98%  Weight:      Height:        Intake/Output Summary (Last 24 hours) at 03/13/2018 1126 Last data filed at 03/12/2018 2300 Gross per 24 hour  Intake 120 ml  Output 350 ml  Net -230 ml   Filed Weights   03/02/18 1724  Weight: 69.6 kg    Telemetry    Atrial fibrillation.  Rate 100s-130s.  Pauses up to 2 sec.  Converted to sinus bradycardia and sinus rhythm.   - Personally Reviewed  ECG    n/a - Personally Reviewed  Physical Exam   VS:  BP (!) 169/72 (BP Location: Left Arm)   Pulse (!) 57   Temp 97.8 F (36.6 C) (Oral)   Resp 19   Ht 5\' 5"  (1.651 m)   Wt 69.6 kg   SpO2 98%   BMI 25.53 kg/m  , BMI Body mass index is 25.53 kg/m. GENERAL:  Chronically ill-appearing.  Coughs after drinking.  HEENT: Pupils equal round and reactive, fundi not visualized, oral mucosa unremarkable NECK:  No jugular venous distention, waveform within normal limits,  carotid upstroke brisk and symmetric, no bruits LUNGS:  Clear to auscultation bilaterally HEART:  RRR.   PMI not displaced or sustained,S1 and S2 within normal limits, no S3, no S4, no clicks, no rubs, no murmurs ABD:  Flat, positive bowel sounds normal in frequency in pitch, no bruits, no rebound, no guarding, no midline pulsatile mass, no hepatomegaly, no splenomegaly EXT:  2 plus pulses throughout.  + LUE edema, no cyanosis no clubbing SKIN:  No rashes no nodules NEURO:  Cranial nerves II through XII grossly intact, L sided paralysis PSYCH:  Cognitively intact, oriented to person place and time   Labs    Chemistry Recent Labs  Lab 03/10/18 1033 03/12/18 0331 03/13/18 0318  NA 138 136 137  K 4.0 3.4* 4.1  CL 110 103 108  CO2 21* 23 22  GLUCOSE 111* 94 98  BUN 18 6* 20  CREATININE 1.18* 0.87 1.22*  CALCIUM 8.8* 8.7* 8.5*  GFRNONAA 41* 59* 39*  GFRAA 47* >60 45*  ANIONGAP 7 10 7      Hematology Recent Labs  Lab 03/08/18 1019 03/12/18 0331 03/13/18 0318  WBC  --  24.2* 8.6  RBC  --  4.65 3.95  HGB 11.0* 12.7 10.6*  HCT 37.5 38.5 35.0*  MCV  --  82.8 88.6  MCH  --  27.3 26.8  MCHC  --  33.0 30.3  RDW  --  12.4 15.3  PLT  --  268 464*    Cardiac EnzymesNo results for input(s): TROPONINI in the last 168 hours. No results for input(s): TROPIPOC in the last 168 hours.   BNPNo results for input(s): BNP, PROBNP in the last 168 hours.   DDimer No results for input(s): DDIMER in the last 168 hours.   Radiology    No results found.  Cardiac Studies   Echo 02/2018:  Study Conclusions  - Left ventricle: The cavity size was normal. Wall thickness was   increased in a pattern of mild LVH. There was mild focal basal   hypertrophy of the septum. Systolic function was vigorous. The   estimated ejection fraction was in the range of 65% to 70%. Wall   motion was normal; there were no regional wall motion   abnormalities. Doppler parameters are consistent with  abnormal   left ventricular relaxation (grade 1 diastolic dysfunction). - Pulmonary arteries: Systolic pressure was moderately to severely   increased. PA peak pressure: 60 mm Hg (S).  Impressions:  - Vigorous LV systolic function; mild LVH; mild diastolic   dysfunction; sclerotic aortic valve; mild TR; moderate to severe   pulmonary hypertension.  Patient Profile     Brenda Hall is an 28F with CAD s/p NSTEMI and BMS, PAF on Xarelto, CKD 3, HTN, HL and PMR here with acute stroke and atrial fibrillation with RVR.  Assessment & Plan    # Atrial fibrillation with RVR: # Sick sinus syndrome: Brenda Hall converted to sinus on oral amiodarone.  She was on diltiazem 30mg  q8h and metoprolol 100mg  bid.  She is now bradycardic in sinus rhythm and her BP is elevated.   Rates remain poorly controlled after increasing diltiazem and metoprolol but are better.  We will stop diltiazem and continue metoprolol.  She was on amlodipine at home.  Will resume 5mg  daily.  Xarelto switched to Eliquis given that she presented with a stroke while on Xarelto.  # CAD: Not an active issue.  Continue aspirin, metoprolol, and atorvastatin.  # Stroke: Anticoagulation on hold for 1 week. Resumed 11/22.   # L UE edema: L UE Doppler showed no deep vein thrombosis but did show an age-indeterminate superficial vein thrombosis of the left cephalic vein.  Resumed anticoagulation today as above.       For questions or updates, please contact Brenda Hall Please consult [email or web address] for contact info under        Signed, Brenda Latch, MD  03/13/2018, 11:26 AM

## 2018-03-13 NOTE — Progress Notes (Signed)
Discharge to: [facility] Anticipated discharge date: 03/13/18 Family notified: Yes, by phone Transportation by: [facility]  Report #: [phone], Room 105  Transport for 4:00 PM.  CSW signing off.  Laveda Abbe LCSW [phone]

## 2018-03-13 NOTE — Plan of Care (Signed)
  Problem: Education: Goal: Knowledge of General Education information will improve Description Including pain rating scale, medication(s)/side effects and non-pharmacologic comfort measures Outcome: Progressing   

## 2018-03-13 NOTE — Discharge Summary (Addendum)
Physician Discharge Summary  Brenda Hall XLK:[id number] DOB: [birth year] DOA: 03/02/2018  PCP: [name], MD  Admit date: 03/02/2018 Discharge date: 03/13/2018  Time spent: 25 minutes  Recommendations for Outpatient Follow-up:  1. continue amiodarone to 200 mg bid 03/20/18 then swithc to once daily 2. Stop Cardizem, stop aspirin as now on eliquis 3. Note dosage chang of metoprolol--resumed Amlodipine 4. Get LFT and TSH 1 week 5. Needs cbc 1 week as well 6. Needs Op stroke follow up 7. D/c to SNF for therapy services  Discharge Diagnoses:  Principal Problem:   Cerebrovascular accident (CVA) due to occlusion of right middle cerebral artery ([place]) Active Problems:   Essential hypertension   CAD, NATIVE VESSEL   Polymyalgia rheumatica (HCC)   Atrial fibrillation (HCC)   CKD (chronic kidney disease), stage III ([name])   Sick sinus syndrome ([place])   Discharge Condition: improved  Diet recommendation:  hh low salt  Filed Weights   03/02/18 1724  Weight: 69.6 kg    History of present illness:  84 year old female Paroxysmal A. fib chads score >7 on Xarelto NSTEMI 2007 status post BMS Chronic kidney disease stage III IBS HTN Chronic low back pain status post kyphoplasty Polymyalgia rheumatica with neck pain Carotid stenosis Melanoma of the nose back Prior CVA Prior L1 fracture status post kyphoplasty 06/6642 Diastolic heart failure   Admitted to hospital 03/02/2018 with recent upper respiratory infection and found to have left leg weakness, slurred speech and right eye drooping although had surgery previously  Code stroke was initially called neuro telemetry consult consult code stroke-patient was transferred to [facility]  FLipped into [place] fibrillation and was managed by cardiology as below   Hospital Course:  Acute CVA-work-up showed right MCA infarct large on right side echo EF 65-70 with sclerotic aortic valve A1c 5.5 LDL 65-will need  Eliquis probably starting 11/22 continue aspirin 325 Paroxysmal A. Fib-proving difficult to control-meds soon as per cardiology-had pauses-seems to conver to NSR on 200 bid amiodarone--dtopped cardizem due to brady this admit  metoprolol 100 twice daily Leukocytosis no overt etiologies other than may be superficial phlebitis-monitor repeat labs in am HTN-blood pressures 130s CAD with an STEMI 2007-chagnign to elliquis alone on d/c for stroke-might consider asa addition if felt needed as op CKD 3-Creat is improved to some degree PMR-cotninue meds Chronic degenerative back pain   Consultations:  Neurology and cardiology  Discharge Exam: Vitals:   03/13/18 0328 03/13/18 0815  BP: (!) 143/55 (!) 169/72  Pulse: (!) 52 (!) 57  Resp: 18 19  Temp: 97.8 F (36.6 C) 97.8 F (36.6 C)  SpO2: 98% 98%    General: eomi ncat in nad-no pallor no ict Cardiovascular: s1  s2no m/r/g-in nsr Respiratory: cta b abd sof tnt nd no rebound  Discharge Instructions   Discharge Instructions    Diet - low sodium heart healthy   Complete by:  As directed    Discharge instructions   Complete by:  As directed    Patient will be discharged to skilled nursing facility, continue physical and occupational thearpy.  Patient will need to follow up with primary care provider within one week of discharge.  Follow up with neurology in 4-6 weeks. Patient should continue medications as prescribed.  Patient should follow a Dysphagia 1 diet. Continue taking aspirin for one week then start Eliquis.   Increase activity slowly   Complete by:  As directed      Allergies as of 03/13/2018  Reactions   Epinephrine Other (See Comments)   During dental procedure-caused facial swelling at injection site. Pt has had epinephrine since then for nose and facial surgery and did fine with it per pt.   Clopidogrel Bisulfate Itching, Rash   Plavix   Oxycodone    Hallucinations. Pt states that she can tolerate hydrocodone       Medication List    STOP taking these medications   aspirin 81 MG tablet   azithromycin 250 MG tablet Commonly known as:  ZITHROMAX   lidocaine 5 % Commonly known as:  LIDODERM   metoprolol succinate 50 MG 24 hr tablet Commonly known as:  TOPROL-XL   predniSONE 1 MG tablet Commonly known as:  DELTASONE   XARELTO 15 MG Tabs tablet Generic drug:  Rivaroxaban     TAKE these medications   Abaloparatide 3120 MCG/1.56ML Sopn Inject 80 mcg into the skin daily.   acetaminophen 500 MG tablet Commonly known as:  TYLENOL Take 500 mg by mouth every 6 (six) hours as needed for mild pain.   amiodarone 200 MG tablet Commonly known as:  PACERONE Take 1 tablet (200 mg total) by mouth 2 (two) times daily.   amiodarone 200 MG tablet Commonly known as:  PACERONE Take 1 tablet (200 mg total) by mouth daily for 7 days.   amLODipine 5 MG tablet Commonly known as:  NORVASC Take 5 mg by mouth daily.   apixaban 5 MG Tabs tablet Commonly known as:  ELIQUIS Take 1 tablet (5 mg total) by mouth 2 (two) times daily.   atorvastatin 80 MG tablet Commonly known as:  LIPITOR Take 1 tablet (80 mg total) by mouth at bedtime.   CALCIUM 1200 PO Take 1 tablet by mouth at bedtime.   estrogens (conjugated) 0.3 MG tablet Commonly known as:  PREMARIN Take 1 tablet (0.3 mg total) by mouth every evening.   fluticasone 50 MCG/ACT nasal spray Commonly known as:  FLONASE Place 2 sprays into both nostrils daily.   loperamide 2 MG capsule Commonly known as:  IMODIUM Take 2 mg by mouth daily as needed for diarrhea or loose stools.   metoprolol tartrate 100 MG tablet Commonly known as:  LOPRESSOR Take 1 tablet (100 mg total) by mouth 2 (two) times daily.   nitroGLYCERIN 0.4 MG SL tablet Commonly known as:  NITROSTAT Place 1 tablet (0.4 mg total) under the tongue every 5 (five) minutes as needed for chest pain.   pantoprazole 40 MG tablet Commonly known as:  PROTONIX Take 40 mg by mouth  daily.   RESOURCE THICKENUP CLEAR Powd Take 120 g by mouth as needed (nectar thick liquids).   traMADol 50 MG tablet Commonly known as:  ULTRAM Take 1 tablet (50 mg total) by mouth every 6 (six) hours as needed for moderate pain or severe pain.   Zinc 50 MG Caps Take 50 mg by mouth daily.     ASK your doctor about these medications   amoxicillin-clavulanate 875-125 MG tablet Commonly known as:  AUGMENTIN Take 1 tablet by mouth 2 (two) times daily for 10 days. Ask about: Should I take this medication?      Allergies  Allergen Reactions  . Epinephrine Other (See Comments)    During dental procedure-caused facial swelling at injection site. Pt has had epinephrine since then for nose and facial surgery and did fine with it per pt.  . Clopidogrel Bisulfate Itching and Rash    Plavix  . Oxycodone     Hallucinations.  Pt states that she can tolerate hydrocodone    Contact information for follow-up providers    [name], MD. Schedule an appointment as soon as possible for a visit in 1 week(s).   Specialty:  Family Medicine Why:  Hospital follow up Contact information: [address] [place] [address] [phone]        Garvin Fila, MD. Schedule an appointment as soon as possible for a visit in 6 week(s).   Specialties:  Neurology, Radiology Why:  Stroke clinic Contact information: [address] [place] [address] [phone]        Lendon Colonel, NP Follow up on 03/26/2018.   Specialties:  Nurse Practitioner, Radiology, Cardiology Why:  at 10:30 AM   please note this is in [place], no availabilty in [place] - this is only visit in [place] then you will go back into the [place] schedule.  Contact information: [address] [place] [address] [phone]            Contact information for after-discharge care    [facility] .   Service:   Skilled Nursing Contact information: [address] [place] [phone]                   The results of significant diagnostics from this hospitalization (including imaging, microbiology, ancillary and laboratory) are listed below for reference.    Significant Diagnostic Studies: Ct Angio Head W Or Wo Contrast  Result Date: 03/02/2018 CLINICAL DATA:  Left-sided weakness EXAM: CT ANGIOGRAPHY HEAD AND NECK CT PERFUSION BRAIN TECHNIQUE: Multidetector CT imaging of the head and neck was performed using the standard protocol during bolus administration of intravenous contrast. Multiplanar CT image reconstructions and MIPs were obtained to evaluate the vascular anatomy. Carotid stenosis measurements (when applicable) are obtained utilizing NASCET criteria, using the distal internal carotid diameter as the denominator. Multiphase CT imaging of the brain was performed following IV bolus contrast injection. Subsequent parametric perfusion maps were calculated using RAPID software. CONTRAST:  90mL ISOVUE-370 IOPAMIDOL (ISOVUE-370) INJECTION 76% COMPARISON:  None. FINDINGS: CTA NECK FINDINGS SKELETON: There is no bony spinal canal stenosis. No lytic or blastic lesion. OTHER NECK: Normal pharynx, larynx and major salivary glands. No cervical lymphadenopathy. Unremarkable thyroid gland. UPPER CHEST: No pneumothorax or pleural effusion. No nodules or masses. AORTIC ARCH: There is mild calcific atherosclerosis of the aortic arch. There is no aneurysm, dissection or hemodynamically significant stenosis of the visualized ascending aorta and aortic arch. Conventional 3 vessel aortic branching pattern. The visualized proximal subclavian arteries are widely patent. RIGHT CAROTID SYSTEM: --Common carotid artery: Widely patent origin without common carotid artery dissection or aneurysm. --Internal carotid artery: No dissection, occlusion or aneurysm. There is mixed density  atherosclerosis extending into the proximal ICA, resulting in less than 50% stenosis. --External carotid artery: No acute abnormality. LEFT CAROTID SYSTEM: --Common carotid artery: Widely patent origin without common carotid artery dissection or aneurysm. --Internal carotid artery: No dissection, occlusion or aneurysm. There is mixed density atherosclerosis extending into the proximal ICA, resulting in less than 50% stenosis. --External carotid artery: No acute abnormality. VERTEBRAL ARTERIES: Left dominant configuration. There is moderate narrowing of both vertebral artery origins. No dissection, occlusion or flow-limiting stenosis to the vertebrobasilar confluence. CTA HEAD FINDINGS ANTERIOR CIRCULATION: --Intracranial internal carotid arteries: Atherosclerotic calcification of the internal carotid arteries at the skull base without hemodynamically significant stenosis. --Anterior cerebral arteries:  Normal. Both A1 segments are present. Patent anterior communicating artery. --Middle cerebral arteries: There is a short segment occlusion of the proximal right M2 segment. The length of the occluded segment is approximately 5 mm. --Posterior communicating arteries: Present bilaterally. POSTERIOR CIRCULATION: --Basilar artery: Normal. --Posterior cerebral arteries: Normal. --Superior cerebellar arteries: Normal. --Inferior cerebellar arteries: Normal anterior and posterior inferior cerebellar arteries. VENOUS SINUSES: As permitted by contrast timing, patent. ANATOMIC VARIANTS: None DELAYED PHASE: No parenchymal contrast enhancement. Review of the MIP images confirms the above findings. ASPECTS ([place] Stroke Program Early CT Score) - Ganglionic level infarction (caudate, lentiform nuclei, internal capsule, insula, M1-M3 cortex): 5 - Supraganglionic infarction (M4-M6 cortex): 1 Total score (0-10 with 10 being normal): 6 CT Brain Perfusion Findings: CBF (<30%) Volume: 73mL Perfusion (Tmax>6.0s) volume: 42mL Mismatch  Volume: 7mL Infarction Location:Posterior right MCA territory IMPRESSION: 1. Short segment occlusion of the right middle cerebral artery proximal M2 segment. 2. 21 mL region of ischemia in the posterior right MCA territory by CT perfusion criteria. The CT perfusion data does not demonstrate a core infarct, but the ASPECT Score is 6 with findings of early infarction in the posterior right MCA territory. 3. Bilateral carotid bifurcation atherosclerosis with less than 50% stenosis. 4.  Aortic atherosclerosis (ICD10-I70.0). These results were called by telephone at the time of interpretation on 03/02/2018 at 6:07 pm to Dr. Daleen Bo , who verbally acknowledged these results. Electronically Signed   By: Ulyses Jarred M.D.   On: 03/02/2018 18:25   Ct Angio Neck W And/or Wo Contrast  Result Date: 03/02/2018 CLINICAL DATA:  Left-sided weakness EXAM: CT ANGIOGRAPHY HEAD AND NECK CT PERFUSION BRAIN TECHNIQUE: Multidetector CT imaging of the head and neck was performed using the standard protocol during bolus administration of intravenous contrast. Multiplanar CT image reconstructions and MIPs were obtained to evaluate the vascular anatomy. Carotid stenosis measurements (when applicable) are obtained utilizing NASCET criteria, using the distal internal carotid diameter as the denominator. Multiphase CT imaging of the brain was performed following IV bolus contrast injection. Subsequent parametric perfusion maps were calculated using RAPID software. CONTRAST:  18mL ISOVUE-370 IOPAMIDOL (ISOVUE-370) INJECTION 76% COMPARISON:  None. FINDINGS: CTA NECK FINDINGS SKELETON: There is no bony spinal canal stenosis. No lytic or blastic lesion. OTHER NECK: Normal pharynx, larynx and major salivary glands. No cervical lymphadenopathy. Unremarkable thyroid gland. UPPER CHEST: No pneumothorax or pleural effusion. No nodules or masses. AORTIC ARCH: There is mild calcific atherosclerosis of the aortic arch. There is no aneurysm,  dissection or hemodynamically significant stenosis of the visualized ascending aorta and aortic arch. Conventional 3 vessel aortic branching pattern. The visualized proximal subclavian arteries are widely patent. RIGHT CAROTID SYSTEM: --Common carotid artery: Widely patent origin without common carotid artery dissection or aneurysm. --Internal carotid artery: No dissection, occlusion or aneurysm. There is mixed density atherosclerosis extending into the proximal ICA, resulting in less than 50% stenosis. --External carotid artery: No acute abnormality. LEFT CAROTID SYSTEM: --Common carotid artery: Widely patent origin without common carotid artery dissection or aneurysm. --Internal carotid artery: No dissection, occlusion or aneurysm. There is mixed density atherosclerosis extending into the proximal ICA, resulting in less than 50% stenosis. --External carotid artery: No acute abnormality. VERTEBRAL ARTERIES: Left dominant configuration. There is moderate narrowing of both vertebral artery origins. No dissection, occlusion or flow-limiting stenosis to the vertebrobasilar confluence. CTA HEAD FINDINGS ANTERIOR CIRCULATION: --Intracranial internal carotid arteries: Atherosclerotic calcification of the internal carotid arteries at the skull base without hemodynamically significant stenosis. --Anterior cerebral arteries: Normal. Both  A1 segments are present. Patent anterior communicating artery. --Middle cerebral arteries: There is a short segment occlusion of the proximal right M2 segment. The length of the occluded segment is approximately 5 mm. --Posterior communicating arteries: Present bilaterally. POSTERIOR CIRCULATION: --Basilar artery: Normal. --Posterior cerebral arteries: Normal. --Superior cerebellar arteries: Normal. --Inferior cerebellar arteries: Normal anterior and posterior inferior cerebellar arteries. VENOUS SINUSES: As permitted by contrast timing, patent. ANATOMIC VARIANTS: None DELAYED PHASE: No  parenchymal contrast enhancement. Review of the MIP images confirms the above findings. ASPECTS ([place] Stroke Program Early CT Score) - Ganglionic level infarction (caudate, lentiform nuclei, internal capsule, insula, M1-M3 cortex): 5 - Supraganglionic infarction (M4-M6 cortex): 1 Total score (0-10 with 10 being normal): 6 CT Brain Perfusion Findings: CBF (<30%) Volume: 61mL Perfusion (Tmax>6.0s) volume: 4mL Mismatch Volume: 35mL Infarction Location:Posterior right MCA territory IMPRESSION: 1. Short segment occlusion of the right middle cerebral artery proximal M2 segment. 2. 21 mL region of ischemia in the posterior right MCA territory by CT perfusion criteria. The CT perfusion data does not demonstrate a core infarct, but the ASPECT Score is 6 with findings of early infarction in the posterior right MCA territory. 3. Bilateral carotid bifurcation atherosclerosis with less than 50% stenosis. 4.  Aortic atherosclerosis (ICD10-I70.0). These results were called by telephone at the time of interpretation on 03/02/2018 at 6:07 pm to Dr. Daleen Bo , who verbally acknowledged these results. Electronically Signed   By: Ulyses Jarred M.D.   On: 03/02/2018 18:25   Mr Brain Wo Contrast  Result Date: 03/03/2018 CLINICAL DATA:  Stroke follow-up EXAM: MRI HEAD WITHOUT CONTRAST TECHNIQUE: Multiplanar, multiecho pulse sequences of the brain and surrounding structures were obtained without intravenous contrast. COMPARISON:  None. FINDINGS: BRAIN: Large area of acute ischemia within the right MCA territory, involving much of the frontal and parietal cortex and the right insula. No other diffusion abnormality. The midline structures are normal. No midline shift or other mass effect. There are old bilateral cerebellar infarcts and old infarcts of the right basal ganglia and thalamus. Early confluent hyperintense T2-weighted signal of the periventricular and deep white matter, most commonly due to chronic ischemic  microangiopathy. There is cytotoxic edema throughout the distribution of the ischemia. The cerebral and cerebellar volume are age-appropriate. 2-3 scattered foci of chronic microhemorrhage. VASCULAR: Major intracranial arterial and venous sinus flow voids are normal. SKULL AND UPPER CERVICAL SPINE: Calvarial bone marrow signal is normal. There is no skull base mass. Visualized upper cervical spine and soft tissues are normal. SINUSES/ORBITS: No fluid levels or advanced mucosal thickening. No mastoid or middle ear effusion. The orbits are normal. IMPRESSION: 1. Large right MCA territory infarct without hemorrhage or mass effect. 2. Findings of chronic ischemic microangiopathy with multiple small vessel infarcts. Electronically Signed   By: Ulyses Jarred M.D.   On: 03/03/2018 22:23   Ct Cerebral Perfusion W Contrast  Result Date: 03/02/2018 CLINICAL DATA:  Left-sided weakness EXAM: CT ANGIOGRAPHY HEAD AND NECK CT PERFUSION BRAIN TECHNIQUE: Multidetector CT imaging of the head and neck was performed using the standard protocol during bolus administration of intravenous contrast. Multiplanar CT image reconstructions and MIPs were obtained to evaluate the vascular anatomy. Carotid stenosis measurements (when applicable) are obtained utilizing NASCET criteria, using the distal internal carotid diameter as the denominator. Multiphase CT imaging of the brain was performed following IV bolus contrast injection. Subsequent parametric perfusion maps were calculated using RAPID software. CONTRAST:  32mL ISOVUE-370 IOPAMIDOL (ISOVUE-370) INJECTION 76% COMPARISON:  None. FINDINGS: CTA NECK FINDINGS SKELETON:  There is no bony spinal canal stenosis. No lytic or blastic lesion. OTHER NECK: Normal pharynx, larynx and major salivary glands. No cervical lymphadenopathy. Unremarkable thyroid gland. UPPER CHEST: No pneumothorax or pleural effusion. No nodules or masses. AORTIC ARCH: There is mild calcific atherosclerosis of the  aortic arch. There is no aneurysm, dissection or hemodynamically significant stenosis of the visualized ascending aorta and aortic arch. Conventional 3 vessel aortic branching pattern. The visualized proximal subclavian arteries are widely patent. RIGHT CAROTID SYSTEM: --Common carotid artery: Widely patent origin without common carotid artery dissection or aneurysm. --Internal carotid artery: No dissection, occlusion or aneurysm. There is mixed density atherosclerosis extending into the proximal ICA, resulting in less than 50% stenosis. --External carotid artery: No acute abnormality. LEFT CAROTID SYSTEM: --Common carotid artery: Widely patent origin without common carotid artery dissection or aneurysm. --Internal carotid artery: No dissection, occlusion or aneurysm. There is mixed density atherosclerosis extending into the proximal ICA, resulting in less than 50% stenosis. --External carotid artery: No acute abnormality. VERTEBRAL ARTERIES: Left dominant configuration. There is moderate narrowing of both vertebral artery origins. No dissection, occlusion or flow-limiting stenosis to the vertebrobasilar confluence. CTA HEAD FINDINGS ANTERIOR CIRCULATION: --Intracranial internal carotid arteries: Atherosclerotic calcification of the internal carotid arteries at the skull base without hemodynamically significant stenosis. --Anterior cerebral arteries: Normal. Both A1 segments are present. Patent anterior communicating artery. --Middle cerebral arteries: There is a short segment occlusion of the proximal right M2 segment. The length of the occluded segment is approximately 5 mm. --Posterior communicating arteries: Present bilaterally. POSTERIOR CIRCULATION: --Basilar artery: Normal. --Posterior cerebral arteries: Normal. --Superior cerebellar arteries: Normal. --Inferior cerebellar arteries: Normal anterior and posterior inferior cerebellar arteries. VENOUS SINUSES: As permitted by contrast timing, patent. ANATOMIC  VARIANTS: None DELAYED PHASE: No parenchymal contrast enhancement. Review of the MIP images confirms the above findings. ASPECTS ([place] Stroke Program Early CT Score) - Ganglionic level infarction (caudate, lentiform nuclei, internal capsule, insula, M1-M3 cortex): 5 - Supraganglionic infarction (M4-M6 cortex): 1 Total score (0-10 with 10 being normal): 6 CT Brain Perfusion Findings: CBF (<30%) Volume: 32mL Perfusion (Tmax>6.0s) volume: 62mL Mismatch Volume: 106mL Infarction Location:Posterior right MCA territory IMPRESSION: 1. Short segment occlusion of the right middle cerebral artery proximal M2 segment. 2. 21 mL region of ischemia in the posterior right MCA territory by CT perfusion criteria. The CT perfusion data does not demonstrate a core infarct, but the ASPECT Score is 6 with findings of early infarction in the posterior right MCA territory. 3. Bilateral carotid bifurcation atherosclerosis with less than 50% stenosis. 4.  Aortic atherosclerosis (ICD10-I70.0). These results were called by telephone at the time of interpretation on 03/02/2018 at 6:07 pm to Dr. Daleen Bo , who verbally acknowledged these results. Electronically Signed   By: Ulyses Jarred M.D.   On: 03/02/2018 18:25   Dg Chest Port 1 View  Result Date: 03/08/2018 CLINICAL DATA:  Developing cough EXAM: PORTABLE CHEST 1 VIEW COMPARISON:  10/24/17 FINDINGS: Cardiac shadow is within normal limits. Aortic calcifications are again seen. The lungs are well aerated bilaterally. Minimal left basilar atelectasis is seen. No significant pulmonary edema is noted. No bony abnormality is seen. IMPRESSION: Mild left basilar atelectasis. Electronically Signed   By: Inez Catalina M.D.   On: 03/08/2018 09:37   Dg Swallowing Func-speech Pathology  Result Date: 03/05/2018 Objective Swallowing Evaluation: Type of Study: MBS-Modified Barium Swallow Study  Patient Details Name: Onica H Hall MRN: [id number] Date of Birth: 07-23-1930 Today's Date:  03/05/2018 Time: SLP Start Time (ACUTE  ONLY): 0945 -SLP Stop Time (ACUTE ONLY): 1010 SLP Time Calculation (min) (ACUTE ONLY): 25 min Past Medical History: Past Medical History: Diagnosis Date . Anemia  . CAD in native artery   a. NSTEMI 12/2005 s/p BMS to Cx, prior normal LVEF. [facility] Cancer ([place]) 1988 and  2009  BCC nose and  Melanoma back . Cancer of skin Jan. 2014  [facility] Forehead and Nose . Carotid stenosis   mild . Chronic back pain  . CKD (chronic kidney disease), stage III ([place])  . Coronary atherosclerosis of unspecified type of vessel, native or graft  . DJD (degenerative joint disease) of knee  . History of TIA (transient ischemic attack)  . HTN (hypertension)  . IBS (irritable bowel syndrome)  . Myocardial infarction [facility])   5 yrs ago     dr wall  cardiac . Osteoporosis  . Other diseases of lung, not elsewhere classified   lung nodule . Polymyalgia rheumatica ([place])  . Shingles 01/2016-02/2016 . Stroke [facility])  Past Surgical History: Past Surgical History: Procedure Laterality Date . ABDOMINAL HYSTERECTOMY  1977  partial . BACK SURGERY  1982 . CARDIAC CATHETERIZATION   . COLONOSCOPY N/A 01/27/2013  Procedure: COLONOSCOPY;  Surgeon: Rogene Houston, MD;  Location: [facility];  Service: Endoscopy;  Laterality: N/A;  1030 . EYE SURGERY Bilateral 04/2017 . EYE SURGERY Right 09/23/2017 . IR KYPHO LUMBAR INC FX REDUCE BONE BX UNI/BIL CANNULATION INC/IMAGING  09/30/2017 . IR VERTEBROPLASTY CERV/THOR BX INC UNI/BIL INC/INJECT/IMAGING  09/05/2017 . JOINT REPLACEMENT  2001 and  2011  6 knee surgeries   bil replacement . KNEE ARTHROPLASTY   . KNEE ARTHROSCOPY   . LUMBAR LAMINECTOMY   . PR VEIN BYPASS GRAFT,AORTO-FEM-POP  2010 . removal of melanoma from back  2007 . SPINE SURGERY  2013  Broken back: fusion surgery . tonisllectomy  1944 . total knee anthroplasty   . VERTEBROPLASTY  07/11/2011  Procedure: VERTEBROPLASTY;  Surgeon: Faythe Ghee, MD;  Location: [place] [facility];  Service: Neurosurgery;  Laterality: N/A;  Vertebroplasty  of Lumbar Two HPI: 84 yo female adm to [facility] with left sided weakness (dense hemiplegia), dysarthria and AMS.  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 PMH + for shingles, PMR, TIA, CAD, recent viral infection- s/p Zpac use per pt.  Swallow and speech evaluation ordered.  Pt did not pass the [facility] swallow screen.   Subjective: Im going to disown my children, they wont give me any water Assessment / Plan / Recommendation CHL IP CLINICAL IMPRESSIONS 03/05/2018 Clinical Impression  Pt demonstrates a moderate oral dysphagia complicated by pt impulsivity, poor awarness and distracitbility. When total assist was given pt was able to seal right side of lips on spoon or straw. Anterior spill more significant with cup; pt often began talking with her mouth full of liquid as well. It verbal cues and extra time pt was able to lingually propel 90% of bolus to pyriforms with moderate left oral residuals. Increased cueing with puree needed. There was a significant delay in swallow initiation with pooled liquid in the pyriforms for several seconds. With thin, this resulted in sensed aspiration before the swallow. Only very trace sensed penetration of nectar thick liquids observed after the swallow of mild residue mixing with secretions. Depsite deficits, pt tolerated nectar and puree quite well during study and appears to be able to sense, cough and eject trace penetrates that are likely to occur intermittently. Also of concern was signs of esophageal dysmotility on esophageal sweep (no radiologist  present to confirm); esophageal precautions warranted. Will initiate Dys 1/nectar thick diet and monitor for tolerance and educate family and staff about precautions.  SLP Visit Diagnosis Dysphagia, oropharyngeal phase (R13.12) Attention and concentration deficit following -- Frontal lobe and executive function deficit following -- Impact on safety and function Moderate aspiration risk   CHL IP TREATMENT RECOMMENDATION 03/05/2018 Treatment  Recommendations Therapy as outlined in treatment plan below   Prognosis 03/03/2018 Prognosis for Safe Diet Advancement Guarded Barriers to Reach Goals Severity of deficits;Behavior Barriers/Prognosis Comment -- CHL IP DIET RECOMMENDATION 03/05/2018 SLP Diet Recommendations Dysphagia 1 (Puree) solids;Nectar thick liquid Liquid Administration via [employer] Medication Administration Crushed with puree Compensations Slow rate;Small sips/bites;Minimize environmental distractions;Monitor for anterior loss;Lingual sweep for clearance of pocketing;Clear throat intermittently Postural Changes Seated upright at 90 degrees;Remain semi-upright after after feeds/meals (Comment)   CHL IP OTHER RECOMMENDATIONS 03/05/2018 Recommended Consults -- Oral Care Recommendations Oral care before and after PO Other Recommendations Have oral suction available   CHL IP FOLLOW UP RECOMMENDATIONS 03/03/2018 Follow up Recommendations (No Data)   CHL IP FREQUENCY AND DURATION 03/05/2018 Speech Therapy Frequency (ACUTE ONLY) min 2x/week Treatment Duration 2 weeks      CHL IP ORAL PHASE 03/05/2018 Oral Phase Impaired Oral - Pudding Teaspoon -- Oral - Pudding Cup -- Oral - Honey Teaspoon -- Oral - Honey Cup -- Oral - Nectar Teaspoon Left anterior bolus loss;Weak lingual manipulation;Reduced posterior propulsion;Left pocketing in lateral sulci;Pocketing in anterior sulcus;Delayed oral transit;Decreased bolus cohesion;Premature spillage Oral - Nectar Cup -- Oral - Nectar Straw Left anterior bolus loss;Weak lingual manipulation;Reduced posterior propulsion;Left pocketing in lateral sulci;Pocketing in anterior sulcus;Delayed oral transit;Decreased bolus cohesion;Premature spillage Oral - Thin Teaspoon -- Oral - Thin Cup -- Oral - Thin Straw Left anterior bolus loss;Weak lingual manipulation;Reduced posterior propulsion;Left pocketing in lateral sulci;Pocketing in anterior sulcus;Delayed oral transit;Decreased bolus cohesion;Premature spillage Oral -  Puree Left anterior bolus loss;Weak lingual manipulation;Reduced posterior propulsion;Left pocketing in lateral sulci;Pocketing in anterior sulcus;Delayed oral transit;Decreased bolus cohesion;Premature spillage Oral - Mech Soft -- Oral - Regular -- Oral - Multi-Consistency -- Oral - Pill -- Oral Phase - Comment --  CHL IP PHARYNGEAL PHASE 03/05/2018 Pharyngeal Phase Impaired Pharyngeal- Pudding Teaspoon -- Pharyngeal -- Pharyngeal- Pudding Cup -- Pharyngeal -- Pharyngeal- Honey Teaspoon -- Pharyngeal -- Pharyngeal- Honey Cup -- Pharyngeal -- Pharyngeal- Nectar Teaspoon Delayed swallow initiation-pyriform sinuses Pharyngeal -- Pharyngeal- Nectar Cup -- Pharyngeal -- Pharyngeal- Nectar Straw Delayed swallow initiation-pyriform sinuses;Penetration/Apiration after swallow;Pharyngeal residue - valleculae;Pharyngeal residue - pyriform Pharyngeal Material enters airway, CONTACTS cords and then ejected out;Material does not enter airway Pharyngeal- Thin Teaspoon -- Pharyngeal -- Pharyngeal- Thin Cup -- Pharyngeal -- Pharyngeal- Thin Straw Delayed swallow initiation-pyriform sinuses;Pharyngeal residue - valleculae;Pharyngeal residue - pyriform;Penetration/Aspiration before swallow;Trace aspiration Pharyngeal Material enters airway, passes BELOW cords then ejected out Pharyngeal- Puree Delayed swallow initiation-vallecula Pharyngeal -- Pharyngeal- Mechanical Soft -- Pharyngeal -- Pharyngeal- Regular -- Pharyngeal -- Pharyngeal- Multi-consistency -- Pharyngeal -- Pharyngeal- Pill -- Pharyngeal -- Pharyngeal Comment --  No flowsheet data found. Herbie Baltimore, MA CCC-SLP Acute Rehabilitation Services Pager [phone] Office [phone] Lynann Beaver 03/05/2018, 10:35 AM              Ct Head Code Stroke Wo Contrast  Result Date: 03/02/2018 CLINICAL DATA:  Code stroke.  Left-sided weakness EXAM: CT HEAD WITHOUT CONTRAST TECHNIQUE: Contiguous axial images were obtained from the base of the skull through the  vertex without intravenous contrast. COMPARISON:  None. FINDINGS: Brain: There is no mass, hemorrhage or extra-axial collection.  The size and configuration of the ventricles and extra-axial CSF spaces are normal. There is hypoattenuation within the posterior right MCA territory suggesting acute or early subacute infarct. No midline shift or other mass effect. 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 Vascular: Mild hyperattenuation within the right MCA M2 segment. Bilateral distal ICA atherosclerotic calcification. Skull: The visualized skull base, calvarium and extracranial soft tissues are normal. Sinuses/Orbits: No fluid levels or advanced mucosal thickening of the visualized paranasal sinuses. No mastoid or middle ear effusion. The orbits are normal. ASPECTS [facility] Stroke Program Early CT Score) - Ganglionic level infarction (caudate, lentiform nuclei, internal capsule, insula, M1-M3 cortex): 5 - Supraganglionic infarction (M4-M6 cortex): 1 Total score (0-10 with 10 being normal): 6 IMPRESSION: 1. No acute hemorrhage. 2. Hypoattenuation in the posterior right MCA territory consistent with acute or early subacute infarct. 3. ASPECTS is 6. These results were called by telephone at the time of interpretation on 03/02/2018 at 5:47 pm to Dr. Daleen Bo , who verbally acknowledged these results. Electronically Signed   By: Ulyses Jarred M.D.   On: 03/02/2018 17:53   Vas [place] Upper Extremity Venous Duplex  Result Date: 03/10/2018 UPPER VENOUS STUDY  Indications: Swelling Performing Technologist: Oliver Hum RVT  Examination Guidelines: A complete evaluation includes B-mode imaging, spectral Doppler, color Doppler, and power Doppler as needed of all accessible portions of each vessel. Bilateral testing is considered an integral part of a complete examination. Limited examinations for reoccurring indications may be performed as noted.  Right Findings:  +----------+------------+----------+---------+-----------+-------+ RIGHT     CompressiblePropertiesPhasicitySpontaneousSummary +----------+------------+----------+---------+-----------+-------+ Subclavian    Full                 Yes       Yes            +----------+------------+----------+---------+-----------+-------+  Left Findings: +----------+------------+----------+---------+-----------+-----------------+ LEFT      CompressiblePropertiesPhasicitySpontaneous     Summary      +----------+------------+----------+---------+-----------+-----------------+ IJV           Full                 Yes       Yes                      +----------+------------+----------+---------+-----------+-----------------+ Subclavian    Full                 Yes       Yes                      +----------+------------+----------+---------+-----------+-----------------+ Axillary      Full                 Yes       Yes                      +----------+------------+----------+---------+-----------+-----------------+ Brachial      Full                 Yes       Yes                      +----------+------------+----------+---------+-----------+-----------------+ Radial        Full                                                    +----------+------------+----------+---------+-----------+-----------------+  Ulnar         Full                                                    +----------+------------+----------+---------+-----------+-----------------+ Cephalic    Partial                                 Age Indeterminate +----------+------------+----------+---------+-----------+-----------------+ Basilic       Full                                                    +----------+------------+----------+---------+-----------+-----------------+  Summary:  Right: No evidence of thrombosis in the subclavian.  Left: No evidence of deep vein thrombosis in the upper extremity.  Findings consistent with age indeterminate superficial vein thrombosis involving the left cephalic vein.  *See table(s) above for measurements and observations.  Diagnosing physician: Deitra Mayo MD Electronically signed by Deitra Mayo MD on 03/10/2018 at 3:48:38 PM.    Final     Microbiology: No results found for this or any previous visit (from the past 240 hour(s)).   Labs: Basic Metabolic Panel: Recent Labs  Lab 03/07/18 0709 03/08/18 1019 03/10/18 1033 03/12/18 0331 03/13/18 0318  NA 141 137 138 136 137  K 4.0 4.0 4.0 3.4* 4.1  CL 115* 111 110 103 108  CO2 19* 19* 21* 23 22  GLUCOSE 102* 106* 111* 94 98  BUN 18 17 18  6* 20  CREATININE 0.97 0.86 1.18* 0.87 1.22*  CALCIUM 8.4* 8.5* 8.8* 8.7* 8.5*  MG  --  1.7 2.0  --  1.8   Liver Function Tests: No results for input(s): AST, ALT, ALKPHOS, BILITOT, PROT, ALBUMIN in the last 168 hours. No results for input(s): LIPASE, AMYLASE in the last 168 hours. No results for input(s): AMMONIA in the last 168 hours. CBC: Recent Labs  Lab 03/08/18 1019 03/12/18 0331 03/13/18 0318  WBC  --  24.2* 8.6  NEUTROABS  --  18.2* 4.7  HGB 11.0* 12.7 10.6*  HCT 37.5 38.5 35.0*  MCV  --  82.8 88.6  PLT  --  268 464*   Cardiac Enzymes: No results for input(s): CKTOTAL, CKMB, CKMBINDEX, TROPONINI in the last 168 hours. BNP: BNP (last 3 results) No results for input(s): BNP in the last 8760 hours.  ProBNP (last 3 results) No results for input(s): PROBNP in the last 8760 hours.  CBG: No results for input(s): GLUCAP in the last 168 hours.     Signed:  Nita Sells MD   [facility] 03/13/2018, 9:56 AM

## 2018-03-13 NOTE — Progress Notes (Signed)
Physical Therapy Treatment Patient Details Name: Brenda Hall MRN: [id number] DOB: 1930-10-09 Today's Date: 03/13/2018    History of Present Illness Pt is a 84 y/o female with PMH significant for atrial fibrillation on Xarelto, CAD, history of CVA, hypertension, CKD stage III, and polymyalgia rheumatica admitted with L sided weakness, facial droop and slurred speech.  Hx of R eye surgery 4 days ago and R eye drooping since. CT reveals acute/early subacute infarct in the posterior R MCA territory. Admitted for further stroke workup.      PT Comments    Pt show signs of emerging awareness of left side.  Pt is subjectively easier to redirect to task.  Worked on sitting balance at EOB and pt was able to focus and work on task.  Transferred via squat pivot to the chair.    Follow Up Recommendations  SNF;Supervision/Assistance - 24 hour     Equipment Recommendations  None recommended by PT    Recommendations for Other Services       Precautions / Restrictions Precautions Precautions: Fall    Mobility  Bed Mobility Overal bed mobility: Needs Assistance Bed Mobility: Rolling;Sidelying to Sit Rolling: Max assist Sidelying to sit: Max assist       General bed mobility comments: pt up via R UE allowing pt to assist more.  Still needed vc's and hand over hand assist  Transfers Overall transfer level: Needs assistance Equipment used: None Transfers: Squat Pivot Transfers     Squat pivot transfers: Max assist;+2 physical assistance        Ambulation/Gait             General Gait Details: unable   Stairs             Wheelchair Mobility    Modified Rankin (Stroke Patients Only) Modified Rankin (Stroke Patients Only) Pre-Morbid Rankin Score: No significant disability Modified Rankin: Severe disability     Balance Overall balance assessment: Needs assistance   Sitting balance-Leahy Scale: Poor Sitting balance - Comments: pt finally able to  activate and attempt some control of trunk.  Sat ~10 min working on sitting midline, using feedback to come back toward midline.  Min assist at best for short periods, max at worst. Postural control: Left lateral lean                                  Cognition Arousal/Alertness: Awake/alert Behavior During Therapy: Restless Overall Cognitive Status: Impaired/Different from baseline Area of Impairment: Attention;Memory;Following commands;Safety/judgement;Awareness;Problem solving                   Current Attention Level: Sustained(sustaining longer.) Memory: Decreased recall of precautions;Decreased short-term memory Following Commands: Follows one step commands inconsistently;Follows one step commands with increased time Safety/Judgement: Decreased awareness of safety;Decreased awareness of deficits Awareness: Emergent Problem Solving: Slow processing;Decreased initiation;Difficulty sequencing;Requires verbal cues;Requires tactile cues General Comments: still distractable, but notably less so.  Can be redirect subjectively easier.      Exercises      General Comments General comments (skin integrity, edema, etc.): Inattension to the left continues, but pt is acknowledging L side when she is cued to that side.      Pertinent Vitals/Pain Pain Assessment: Faces Faces Pain Scale: Hurts little more Pain Location: left arm, general, back Pain Descriptors / Indicators: Discomfort;Sore Pain Intervention(s): Monitored during session    Home Living  Prior Function            PT Goals (current goals can now be found in the care plan section) Acute Rehab PT Goals Patient Stated Goal: wants to go home to her cat PT Goal Formulation: With patient Time For Goal Achievement: 03/17/18 Potential to Achieve Goals: Good Progress towards PT goals: Progressing toward goals    Frequency    Min 3X/week      PT Plan Current plan  remains appropriate    Co-evaluation              AM-PAC PT "6 Clicks" Daily Activity  Outcome Measure  Difficulty turning over in bed (including adjusting bedclothes, sheets and blankets)?: Unable Difficulty moving from lying on back to sitting on the side of the bed? : Unable Difficulty sitting down on and standing up from a chair with arms (e.g., wheelchair, bedside commode, etc,.)?: Unable Help needed moving to and from a bed to chair (including a wheelchair)?: Total Help needed walking in hospital room?: Total Help needed climbing 3-5 steps with a railing? : Total 6 Click Score: 6    End of Session   Activity Tolerance: Patient tolerated treatment well Patient left: in chair;with call bell/phone within reach Nurse Communication: Mobility status PT Visit Diagnosis: Other abnormalities of gait and mobility (R26.89);Muscle weakness (generalized) (M62.81);Hemiplegia and hemiparesis;Pain Hemiplegia - Right/Left: Left Hemiplegia - dominant/non-dominant: Non-dominant Hemiplegia - caused by: Cerebral infarction Pain - Right/Left: Left Pain - part of body: Arm     Time: 1209-1232 PT Time Calculation (min) (ACUTE ONLY): 23 min  Charges:  $Therapeutic Activity: 23-37 mins                     03/13/2018  Brenda Hall, PT Acute Rehabilitation Services [phone]  (pager) [phone]  (office)   Brenda Hall 03/13/2018, 2:24 PM

## 2018-03-13 NOTE — Progress Notes (Signed)
Report called to nurse at the [facility]. IV removed. Awaiting [facility] arrival for transfer.

## 2018-03-13 NOTE — Progress Notes (Signed)
Initial Nutrition Assessment  DOCUMENTATION CODES:   Not applicable  INTERVENTION:   - Continue Ensure Enlive BID (each provides 350 kcal and 20 g protein) - Add Magic Cup BID (each provides 290 kcal and 9 g protein) - Add MVI with minerals daily   NUTRITION DIAGNOSIS:   Swallowing difficulty related to dysphagia as evidenced by per patient/family report, other (comment), per SLP evaluation.   GOAL:   Patient will meet greater than or equal to 90% of their needs   MONITOR:   PO intake, Supplement acceptance, Diet advancement, Labs, I & O's  REASON FOR ASSESSMENT:   Malnutrition Screening Tool    ASSESSMENT:   84 yo female, admitted with CVA. PMH significant for CAD, CVA, HTN, CKD III, anemia, melanoma, IBS, osteoporosis.   Labs: Creatinine 1.22 (variable), GFR 39%/45%, Hgb 10.6, Hct 35.0% Meds: Lipitor 80 mg daily, Ensure Enlive BID, Protonix ED 40 mg daily, Thick-It and Thicken Up PRN  Per chart, wt relatively stable over past 6 months.  Pt with nsg and family at time of visit.   Reports normal appetite. Typically eats 3 meals daily, states she has eaten more during admission than she does at home. Does not enjoy the pureed meats on Dys I diet. Does not follow any special diet at home, and takes Calcium and Zinc daily.   Denies nausea or vomiting, or difficulty chewing. Endorses constipation, and cannot recall the last time she had a BM.   Encouraged pt to eat protein-rich foods with every meal and to eat those foods first if she is not feeling hungry. Encouraged pt to do her best in spite of her current diet. Pt agreeable to trying Magic Cup BID in any flavor.   NUTRITION - FOCUSED PHYSICAL EXAM: Right side only, given left-side stroke   Most Recent Value  Orbital Region  Mild depletion  Upper Arm Region  No depletion  Thoracic and Lumbar Region  No depletion  Buccal Region  No depletion  Temple Region  Mild depletion  Clavicle Bone Region  No depletion   Clavicle and Acromion Bone Region  Unable to assess  Scapular Bone Region  Unable to assess  Dorsal Hand  Unable to assess  Patellar Region  No depletion  Anterior Thigh Region  Mild depletion  Posterior Calf Region  Moderate depletion  Edema (RD Assessment)  Unable to assess  Hair  Reviewed  Eyes  Reviewed  Mouth  Reviewed  Skin  Reviewed  Nails  Reviewed      Diet Order:   Diet Order            Diet - low sodium heart healthy        DIET - DYS 1 Room service appropriate? Yes; Fluid consistency: Nectar Thick  Diet effective now              EDUCATION NEEDS:  No education needs have been identified at this time  Skin:  Skin Assessment: Reviewed RN Assessment  Last BM:  PTA  Height:  Ht Readings from Last 1 Encounters:  03/02/18 5\' 5"  (1.651 m)    Weight:  Wt Readings from Last 1 Encounters:  03/02/18 69.6 kg    Ideal Body Weight:  56.8 kg  BMI:  Body mass index is 25.53 kg/m.  Estimated Nutritional Needs:   Kcal:  1378 calories daily (MSJ x 1.2)  Protein:  57-68 g protein daily (1.0-1.2 g/kg ABW)  Fluid:  >/= 1.3 L daily or per MD discretion  Althea Grimmer, MS, RDN, LDN On-call pager: [phone]

## 2018-03-14 ENCOUNTER — Encounter (HOSPITAL_COMMUNITY)
Admission: RE | Admit: 2018-03-14 | Discharge: 2018-03-14 | Disposition: A | Discharge status: Home / Self Care | Payer: Medicare Other | Source: Skilled Nursing Facility

## 2018-03-14 LAB — CBC WITH DIFFERENTIAL/PLATELET
ABS IMMATURE GRANULOCYTES: 0.03 10*3/uL (ref 0.00–0.07)
BASOS PCT: 1 %
Basophils Absolute: 0.1 10*3/uL (ref 0.0–0.1)
EOS ABS: 1.2 10*3/uL — AB (ref 0.0–0.5)
Eosinophils Relative: 13 %
HCT: 36.5 % (ref 36.0–46.0)
Hemoglobin: 10.8 g/dL — ABNORMAL LOW (ref 12.0–15.0)
IMMATURE GRANULOCYTES: 0 %
Lymphocytes Relative: 16 %
Lymphs Abs: 1.4 10*3/uL (ref 0.7–4.0)
MCH: 26.5 pg (ref 26.0–34.0)
MCHC: 29.6 g/dL — ABNORMAL LOW (ref 30.0–36.0)
MCV: 89.5 fL (ref 80.0–100.0)
Monocytes Absolute: 0.9 10*3/uL (ref 0.1–1.0)
Monocytes Relative: 10 %
NEUTROS ABS: 5.2 10*3/uL (ref 1.7–7.7)
NEUTROS PCT: 60 %
PLATELETS: 465 10*3/uL — AB (ref 150–400)
RBC: 4.08 MIL/uL (ref 3.87–5.11)
RDW: 15.5 % (ref 11.5–15.5)
WBC: 8.8 10*3/uL (ref 4.0–10.5)
nRBC: 0 % (ref 0.0–0.2)

## 2018-03-14 LAB — BASIC METABOLIC PANEL
Anion gap: 7 (ref 5–15)
BUN: 17 mg/dL (ref 8–23)
CALCIUM: 8.7 mg/dL — AB (ref 8.9–10.3)
CO2: 24 mmol/L (ref 22–32)
Chloride: 108 mmol/L (ref 98–111)
Creatinine, Ser: 0.98 mg/dL (ref 0.44–1.00)
GFR, EST AFRICAN AMERICAN: 59 mL/min — AB (ref >60)
GFR, EST NON AFRICAN AMERICAN: 51 mL/min — AB (ref >60)
Glucose, Bld: 97 mg/dL (ref 70–99)
POTASSIUM: 4.1 mmol/L (ref 3.5–5.1)
SODIUM: 139 mmol/L (ref 135–145)

## 2018-03-16 ENCOUNTER — Non-Acute Institutional Stay (SKILLED_NURSING_FACILITY): Payer: Medicare Other | Admitting: Internal Medicine

## 2018-03-16 ENCOUNTER — Encounter: Payer: Self-pay | Admitting: Internal Medicine

## 2018-03-16 ENCOUNTER — Other Ambulatory Visit (HOSPITAL_COMMUNITY): Payer: Self-pay

## 2018-03-16 DIAGNOSIS — I10 Essential (primary) hypertension: Secondary | ICD-10-CM

## 2018-03-16 DIAGNOSIS — M810 Age-related osteoporosis without current pathological fracture: Secondary | ICD-10-CM

## 2018-03-16 DIAGNOSIS — I480 Paroxysmal atrial fibrillation: Secondary | ICD-10-CM

## 2018-03-16 DIAGNOSIS — N183 Chronic kidney disease, stage 3 unspecified: Secondary | ICD-10-CM

## 2018-03-16 DIAGNOSIS — I63511 Cerebral infarction due to unspecified occlusion or stenosis of right middle cerebral artery: Secondary | ICD-10-CM

## 2018-03-16 DIAGNOSIS — E785 Hyperlipidemia, unspecified: Secondary | ICD-10-CM

## 2018-03-16 DIAGNOSIS — I4891 Unspecified atrial fibrillation: Secondary | ICD-10-CM | POA: Diagnosis not present

## 2018-03-16 NOTE — Progress Notes (Signed)
Provider:  Veleta Miners MD Location:    [facility] Room Number: 105/D Place of Service:  SNF (31)  PCP: [name], MD Patient Care Team: [name], MD as PCP - General (Family Medicine) Harl Bowie Alphonse Guild, MD as PCP - Cardiology (Cardiology)  Extended Emergency Contact Information Primary Emergency Contact: Mooney,David Address: [address]          [place], [place] [address] Johnnette Litter of [place] Phone: [phone] Work Phone: [phone] Mobile Phone: [phone] Relation: Son Secondary Emergency Contact: Lovett Calender Home Phone: [phone] Relation: Other  Code Status: Full Code Goals of Care: Advanced Directive information Advanced Directives 03/16/2018  Does Patient Have a Medical Advance Directive? Yes  Type of Advance Directive (No Data)  Does patient want to make changes to medical advance directive? No - Patient declined  Would patient like information on creating a medical advance directive? No - Patient declined  Pre-existing out of facility DNR order (yellow form or pink MOST form) -      Chief Complaint  Patient presents with  . New Admit To SNF    New Admission Visit    HPI: Patient is a 84 y.o. female seen today for admission to SNF for therapy. She stayed in the hospital for Acute Right MCA Stroke with Atrial Fibrilatin From 11/11-11/22 Patient has h/o CAD, Hypertension, CKD, Chronic Atrial Fibrillation on Chronic anticoagulation, Hyperlipidemia, h/o PMR, Osteoporosis  She was brought to the hospital by her family when she had acute onset of left-sided weakness with facial droop and slurred speech.  She lives with her grandson who found her with above symptoms at home.   Her CT scan and later MRI showed Large right MCA Infarct without Hemorrhage or Mass effect. She also had Atrial Fibrillation with RVR with Rapid Ventricular response. Cardiology had to start her n Loading doses of Amiodarone and incrase her  Metoprolol to 100 mg. She converted to Sinus rhythm.  Her diltiazem was stopped. And her Xarelto was eventually switched to Eliquis  She had Doppler of her LUE which showed Superficial vein Thrombosis  Before the stroke patient was very active.  Her grandson lives with her. Her main complain today  was pain in her left leg , Arm neck and her back.  She also was complaining about the staff and was very upset and crying.    Past Medical History:  Diagnosis Date  . Anemia   . CAD in native artery    a. NSTEMI 12/2005 s/p BMS to Cx, prior normal LVEF.  Marland Kitchen Cancer ([place]) 1988 and  2009   BCC nose and  Melanoma back  . Cancer of skin Jan. 2014   [facility] Forehead and Nose  . Carotid stenosis    mild  . Chronic back pain   . CKD (chronic kidney disease), stage III ([place])   . Coronary atherosclerosis of unspecified type of vessel, native or graft   . DJD (degenerative joint disease) of knee   . History of TIA (transient ischemic attack)   . HTN (hypertension)   . IBS (irritable bowel syndrome)   . Myocardial infarction [facility])    5 yrs ago     dr wall  cardiac  . Osteoporosis   . Other diseases of lung, not elsewhere classified    lung nodule  . Polymyalgia rheumatica ([place])   . Shingles 01/2016-02/2016  . Stroke [facility])    Past Surgical History:  Procedure Laterality Date  . ABDOMINAL HYSTERECTOMY  [place]  . CARDIAC CATHETERIZATION    . COLONOSCOPY N/A 01/27/2013   Procedure: COLONOSCOPY;  Surgeon: Rogene Houston, MD;  Location[facility];  Service: Endoscopy;  Laterality: N/A;  1030  . EYE SURGERY Bilateral 04/2017  . EYE SURGERY Right 09/23/2017  . IR KYPHO LUMBAR INC FX REDUCE BONE BX UNI/BIL CANNULATION INC/IMAGING  09/30/2017  . IR VERTEBROPLASTY CERV/THOR BX INC UNI/BIL INC/INJECT/IMAGING  09/05/2017  . JOINT REPLACEMENT  2001 and  2011   6 knee surgeries   bil replacement  . KNEE ARTHROPLASTY    . KNEE ARTHROSCOPY    . LUMBAR LAMINECTOMY    . PR  VEIN BYPASS GRAFT,AORTO-FEM-POP  2010  . removal of melanoma from back  2007  . SPINE SURGERY  2013   Broken back: fusion surgery  . tonisllectomy  1944  . total knee anthroplasty    . VERTEBROPLASTY  07/11/2011   Procedure: VERTEBROPLASTY;  Surgeon: Faythe Ghee, MD;  Location: [facility] NEURO ORS;  Service: Neurosurgery;  Laterality: N/A;  Vertebroplasty of Lumbar Two    reports that she has never smoked. She has never used smokeless tobacco. She reports that she drinks alcohol. She reports that she does not use drugs. Social History   Socioeconomic History  . Marital status: Widowed    Spouse name: Not on file  . Number of children: Not on file  . Years of education: Not on file  . Highest education level: Not on file  Occupational History  . Not on file  Social Needs  . Financial resource strain: Not on file  . Food insecurity:    Worry: Not on file    Inability: Not on file  . Transportation needs:    Medical: Not on file    Non-medical: Not on file  Tobacco Use  . Smoking status: Never Smoker  . Smokeless tobacco: Never Used  . Tobacco comment: tobacco use - no  Substance and Sexual Activity  . Alcohol use: Yes    Alcohol/week: 0.0 standard drinks    Comment: occasionally a glass of wine or margarita per pt  . Drug use: No  . Sexual activity: Not on file  Lifestyle  . Physical activity:    Days per week: Not on file    Minutes per session: Not on file  . Stress: Not on file  Relationships  . Social connections:    Talks on phone: Not on file    Gets together: Not on file    Attends religious service: Not on file    Active member of club or organization: Not on file    Attends meetings of clubs or organizations: Not on file    Relationship status: Not on file  . Intimate partner violence:    Fear of current or ex partner: Not on file    Emotionally abused: Not on file    Physically abused: Not on file    Forced sexual activity: Not on file  Other Topics Concern   . Not on file  Social History Narrative   Married, retired. 3 children. Regular exercise -yes; hobbies = swimming.    Functional Status Survey:    Family History  Problem Relation Age of Onset  . Heart disease Father   . Hypertension Father   . Heart attack Father   . Cancer Mother        bladder   . Hypertension Daughter   . Atrial  fibrillation Daughter   . Hypertension Son   . Colon cancer Neg Hx     Health Maintenance  Topic Date Due  . PNA vac Low Risk Adult (2 of 2 - PPSV23) 04/08/2018 (Originally 03/06/2004)  . TETANUS/TDAP  03/09/2023  . INFLUENZA VACCINE  Completed  . DEXA SCAN  Completed    Allergies  Allergen Reactions  . Epinephrine Other (See Comments)    During dental procedure-caused facial swelling at injection site. Pt has had epinephrine since then for nose and facial surgery and did fine with it per pt.  . Clopidogrel Bisulfate Itching and Rash    Plavix  . Oxycodone     Hallucinations. Pt states that she can tolerate hydrocodone    Outpatient Encounter Medications as of 03/16/2018  Medication Sig  . Abaloparatide (TYMLOS) 3120 MCG/1.56ML SOPN Inject 80 mcg into the skin daily.  Marland Kitchen acetaminophen (TYLENOL) 500 MG tablet Take 500 mg by mouth every 6 (six) hours as needed for mild pain.  Marland Kitchen amiodarone (PACERONE) 200 MG tablet Take 1 tablet (200 mg total) by mouth 2 (two) times daily.  Marland Kitchen amiodarone (PACERONE) 200 MG tablet Take 1 tablet (200 mg total) by mouth daily for 7 days.  Marland Kitchen amLODipine (NORVASC) 5 MG tablet Take 5 mg by mouth daily.  Marland Kitchen apixaban (ELIQUIS) 5 MG TABS tablet Take 1 tablet (5 mg total) by mouth 2 (two) times daily.  Marland Kitchen atorvastatin (LIPITOR) 80 MG tablet Take 1 tablet (80 mg total) by mouth at bedtime.  . Calcium Carbonate-Vit D-Min (CALCIUM 1200 PO) Take 1 tablet by mouth at bedtime.   Marland Kitchen estrogens, conjugated, (PREMARIN) 0.3 MG tablet Take 1 tablet (0.3 mg total) by mouth every evening.  . fluticasone (FLONASE) 50 MCG/ACT nasal spray  Place 2 sprays into both nostrils daily.  . hydrocortisone cream 1 % Apply sparingly to rectal once a day prn  . loperamide (IMODIUM) 2 MG capsule Take 2 mg by mouth daily as needed for diarrhea or loose stools.   . Maltodextrin-Xanthan Gum (RESOURCE THICKENUP CLEAR) POWD Take 120 g by mouth as needed (nectar thick liquids).  . metoprolol tartrate (LOPRESSOR) 100 MG tablet Take 1 tablet (100 mg total) by mouth 2 (two) times daily.  . nitroGLYCERIN (NITROSTAT) 0.4 MG SL tablet Place 1 tablet (0.4 mg total) under the tongue every 5 (five) minutes as needed for chest pain.  . pantoprazole (PROTONIX) 40 MG tablet Take 40 mg by mouth daily.  . traMADol (ULTRAM) 50 MG tablet Take 1 tablet (50 mg total) by mouth every 6 (six) hours as needed for moderate pain or severe pain.  . Zinc 50 MG CAPS Take 50 mg by mouth daily.   No facility-administered encounter medications on file as of 03/16/2018.      Review of Systems  Constitutional: Positive for activity change and appetite change.  HENT: Negative.   Respiratory: Negative.   Cardiovascular: Negative.   Gastrointestinal: Negative.   Genitourinary: Negative.   Musculoskeletal: Positive for arthralgias and back pain.  Skin: Negative.   Neurological: Positive for weakness.  Psychiatric/Behavioral: Positive for dysphoric mood and sleep disturbance.    Vitals:   03/16/18 1027  BP: (!) 156/66  Pulse: (!) 56  Resp: 19  Temp: 97.9 F (36.6 C)  TempSrc: Oral  SpO2: 95%   There is no height or weight on file to calculate BMI. Physical Exam  Constitutional: She is oriented to person, place, and time. She appears well-developed and well-nourished.  HENT:  Head: Normocephalic.  Mouth/Throat: Oropharynx is clear and moist.  Eyes: Pupils are equal, round, and reactive to light.  Neck: Neck supple.  Cardiovascular: Normal rate and regular rhythm.  No murmur heard. Pulmonary/Chest: Effort normal and breath sounds normal. No stridor. No  respiratory distress. She has no wheezes.  Abdominal: Soft. Bowel sounds are normal. She exhibits no distension. There is no tenderness. There is no guarding.  Musculoskeletal:  Trace edema Bilateral. Has edema in left UE  Neurological: She is alert and oriented to person, place, and time.  Has dense Hemiplegia in Left UE and LE. Was 0/5. Also had Facial Droop  Skin: Skin is warm and dry.  Psychiatric:  Patient was very upset . Crying and Anxious    Labs reviewed: Basic Metabolic Panel: Recent Labs    03/08/18 1019 03/10/18 1033 03/12/18 0331 03/13/18 0318 03/14/18 0415  NA 137 138 136 137 139  K 4.0 4.0 3.4* 4.1 4.1  CL 111 110 103 108 108  CO2 19* 21* 23 22 24   GLUCOSE 106* 111* 94 98 97  BUN 17 18 6* 20 17  CREATININE 0.86 1.18* 0.87 1.22* 0.98  CALCIUM 8.5* 8.8* 8.7* 8.5* 8.7*  MG 1.7 2.0  --  1.8  --    Liver Function Tests: Recent Labs    10/25/17 0702 10/26/17 0647 11/03/17 1228 03/02/18 1729  AST 16 16 21 20   ALT 12 10 13 12   ALKPHOS 66 68  --  83  BILITOT 1.1 1.1 0.3 0.9  PROT 5.9* 6.4* 6.7  6.6 7.6  ALBUMIN 2.7* 2.8*  --  3.6   Recent Labs    10/24/17 1359  LIPASE 34   No results for input(s): AMMONIA in the last 8760 hours. CBC: Recent Labs    03/12/18 0331 03/13/18 0318 03/14/18 0415  WBC 24.2* 8.6 8.8  NEUTROABS 18.2* 4.7 5.2  HGB 12.7 10.6* 10.8*  HCT 38.5 35.0* 36.5  MCV 82.8 88.6 89.5  PLT 268 464* 465*   Cardiac Enzymes: Recent Labs    10/24/17 2010 10/25/17 0222 10/25/17 0702  TROPONINI 0.07* 0.07* 0.05*   BNP: Invalid input(s): POCBNP Lab Results  Component Value Date   HGBA1C 5.5 03/03/2018   Lab Results  Component Value Date   TSH 0.883 03/10/2018   No results found for: VITAMINB12 No results found for: FOLATE No results found for: IRON, TIBC, FERRITIN  Imaging and Procedures obtained prior to SNF admission: No results found.  Assessment/Plan  Cerebrovascular accident (CVA) with Right MCA with Left  Hemiparesis On Eliquis now. On statin BP controlled D/W the Son She will start therapy. Will need lot of Supportive care at home. Follow up with Neuro Essential hypertension Bp stable on Norvasc and Metoprolol Atrial fibrillation with RVR  Xarelto switched to Eliquis 2D Echo showed Ef of 65% On Amiodarone and Metoprolol Rate Controlled Will monitor HR Follow up with Cardiology  CKD (chronic kidney disease), stage III  Creat stable  Hyperlipidemia, On high Dose of statin H/o PMR Not on any Meds right now Osteoporosis On Tymlos Pain Control Will use Ultram right now Would not use anything stroger for now. Depression Will start her on Zoloft  Dysphagia Continue on Honey Thick  Family/ staff Communication:   Labs/tests ordered:  Total time spent in this patient care encounter was _45 minutes; greater than 50% of the visit spent counseling patient, reviewing records , Labs and coordinating care for problems addressed at this encounter.

## 2018-03-17 ENCOUNTER — Other Ambulatory Visit: Payer: Self-pay | Admitting: *Deleted

## 2018-03-17 NOTE — Patient Outreach (Signed)
[facility]) Care Management  03/17/2018  Chastity H Mooney-Riggs [birth year] [phone]   Onsite IDT meeting.  Patient is from home alone, grandson is available at times.  The son is very involved in patient situation and wants the best outcomes for patient.  There are also a lot of family members who are involved. At this time patient it is anticipated patient will need several weeks. PT/OT/ST are all involved.   Attempted to meet with patient, she was not in her room. Will attempt to meet with patient at next facility visit. Royetta Crochet. Laymond Purser, RN, BSN, [place] [phone]) Business Cell  [phone]) Toll Free Office

## 2018-03-18 ENCOUNTER — Other Ambulatory Visit: Payer: Self-pay

## 2018-03-18 ENCOUNTER — Non-Acute Institutional Stay (SKILLED_NURSING_FACILITY): Payer: Medicare Other | Admitting: Internal Medicine

## 2018-03-18 ENCOUNTER — Encounter: Payer: Self-pay | Admitting: Internal Medicine

## 2018-03-18 DIAGNOSIS — R52 Pain, unspecified: Secondary | ICD-10-CM

## 2018-03-18 DIAGNOSIS — I639 Cerebral infarction, unspecified: Secondary | ICD-10-CM | POA: Diagnosis not present

## 2018-03-18 DIAGNOSIS — I480 Paroxysmal atrial fibrillation: Secondary | ICD-10-CM

## 2018-03-18 MED ORDER — TRAMADOL HCL 50 MG PO TABS: 50.0000 mg | ORAL_TABLET | Freq: Four times a day (QID) | ORAL | 0 refills | Status: DC | PRN

## 2018-03-18 NOTE — Progress Notes (Signed)
This is an acute visit.  Level of care is skilled.  Facility is [facility].  Chief complaint-acute visit follow-up pain management also questions about continuing her Premarin.  History of present illness.  Patient is an 84 year old female seen today for pain management as well as questions about her Premarin use.  She had hospitalization for an acute right MCA stroke with atrial fibrillation.  She also has a history of coronary artery disease as well as hypertension chronic kidney disease atrial fibrillation on chronic anticoagulation as well as hyperlipidemia history of PMR and osteoporosis.  He came to the hospital with acute onset of left-sided weakness and facial droop and slurred speech.  CT scan and MRI showed a large MCA infarct without hemorrhage or mass-effect.  In regards atrial fibrillation cardiology had to start on a loading dose of amiodarone secondary to rapid ventricular rate-metoprolol also was increased and she converted to sinus rhythm.  Elonda Husky was stopped her Xarelto was switched to Eliquis.  She also had a Doppler of her lower left upper extremity which showed superficial vein thrombosis.   She does have an order for Tylenol for pain as well as tramadol-patient states the tramadol which been ordered for does not last long enough.  Apparently per nursing she has been taking Tylenol as well.  The clinical pharmacist is in facility today and she has noted the patient is on Premarin - this can increase risk for CVA or a blood clot.  I did discuss this with with patient at bedside- she states she has been taking the Premerin for an extended period time for hot flashes and wants to continue on it despite the increased risk for another CVA or possibly a DVT.  She is quite adamant about this and says it has helped       Past Medical History:  Diagnosis Date  . Anemia   . CAD in native artery    a. NSTEMI 12/2005 s/p BMS to Cx, prior normal LVEF.  Marland Kitchen  Cancer ([place]) 1988 and  2009   BCC nose and  Melanoma back  . Cancer of skin Jan. 2014   [facility] Forehead and Nose  . Carotid stenosis    mild  . Chronic back pain   . CKD (chronic kidney disease), stage III ([place])   . Coronary atherosclerosis of unspecified type of vessel, native or graft   . DJD (degenerative joint disease) of knee   . History of TIA (transient ischemic attack)   . HTN (hypertension)   . IBS (irritable bowel syndrome)   . Myocardial infarction [facility])    5 yrs ago     dr wall  cardiac  . Osteoporosis   . Other diseases of lung, not elsewhere classified    lung nodule  . Polymyalgia rheumatica ([place])   . Shingles 01/2016-02/2016  . Stroke [facility])         Past Surgical History:  Procedure Laterality Date  . ABDOMINAL HYSTERECTOMY  1977   partial  . BACK SURGERY  1982  . CARDIAC CATHETERIZATION    . COLONOSCOPY N/A 01/27/2013   Procedure: COLONOSCOPY;  Surgeon: Rogene Houston, MD;  Location[facility];  Service: Endoscopy;  Laterality: N/A;  1030  . EYE SURGERY Bilateral 04/2017  . EYE SURGERY Right 09/23/2017  . IR KYPHO LUMBAR INC FX REDUCE BONE BX UNI/BIL CANNULATION INC/IMAGING  09/30/2017  . IR VERTEBROPLASTY CERV/THOR BX INC UNI/BIL INC/INJECT/IMAGING  09/05/2017  . JOINT REPLACEMENT  2001 and  2011   6 knee surgeries   bil replacement  . KNEE ARTHROPLASTY    . KNEE ARTHROSCOPY    . LUMBAR LAMINECTOMY    . PR VEIN BYPASS GRAFT,AORTO-FEM-POP  2010  . removal of melanoma from back  2007  . SPINE SURGERY  2013   Broken back: fusion surgery  . tonisllectomy  1944  . total knee anthroplasty    . VERTEBROPLASTY  07/11/2011   Procedure: VERTEBROPLASTY;  Surgeon: Faythe Ghee, MD;  Location: [place] [facility] OR[facility];  Service: Neurosurgery;  Laterality: N/A;  Vertebroplasty of Lumbar Two    reports that she has never smoked. She has never used smokeless tobacco. She reports that she drinks alcohol. She reports that she does not  use drugs. Social History        Socioeconomic History  . Marital status: Widowed    Spouse name: Not on file  . Number of children: Not on file  . Years of education: Not on file  . Highest education level: Not on file  Occupational History  . Not on file  Social Needs  . Financial resource strain: Not on file  . Food insecurity:    Worry: Not on file    Inability: Not on file  . Transportation needs:    Medical: Not on file    Non-medical: Not on file  Tobacco Use  . Smoking status: Never Smoker  . Smokeless tobacco: Never Used  . Tobacco comment: tobacco use - no  Substance and Sexual Activity  . Alcohol use: Yes    Alcohol/week: 0.0 standard drinks    Comment: occasionally a glass of wine or margarita per pt  . Drug use: No  . Sexual activity: Not on file  Lifestyle  . Physical activity:    Days per week: Not on file    Minutes per session: Not on file  . Stress: Not on file  Relationships  . Social connections:    Talks on phone: Not on file    Gets together: Not on file    Attends religious service: Not on file    Active member of club or organization: Not on file    Attends meetings of clubs or organizations: Not on file    Relationship status: Not on file  . Intimate partner violence:    Fear of current or ex partner: Not on file    Emotionally abused: Not on file    Physically abused: Not on file    Forced sexual activity: Not on file  Other Topics Concern  . Not on file  Social History Narrative   Married, retired. 3 children. Regular exercise -yes; hobbies = swimming.    Functional Status Survey:       Family History  Problem Relation Age of Onset  . Heart disease Father   . Hypertension Father   . Heart attack Father   . Cancer Mother        bladder   . Hypertension Daughter   . Atrial fibrillation Daughter   . Hypertension Son   . Colon cancer Neg Hx         Health Maintenance    Topic Date Due  . PNA vac Low Risk Adult (2 of 2 - PPSV23) 04/08/2018 (Originally 03/06/2004)  . TETANUS/TDAP  03/09/2023  . INFLUENZA VACCINE  Completed  . DEXA SCAN  Completed         Allergies  Allergen Reactions  . Epinephrine Other (See Comments)  During dental procedure-caused facial swelling at injection site. Pt has had epinephrine since then for nose and facial surgery and did fine with it per pt.  . Clopidogrel Bisulfate Itching and Rash    Plavix  . Oxycodone     Hallucinations. Pt states that she can tolerate hydrocodone      MEDICATIONS     Medication Sig  . Abaloparatide (TYMLOS) 3120 MCG/1.56ML SOPN Inject 80 mcg into the skin daily.  Marland Kitchen acetaminophen (TYLENOL) 500 MG tablet Take 500 mg by mouth every 6 (six) hours as needed for mild pain.  Marland Kitchen amiodarone (PACERONE) 200 MG tablet Take 1 tablet (200 mg total) by mouth 2 (two) times daily.  Marland Kitchen amiodarone (PACERONE) 200 MG tablet Take 1 tablet (200 mg total) by mouth daily for 7 days.  Marland Kitchen amLODipine (NORVASC) 5 MG tablet Take 5 mg by mouth daily.  Marland Kitchen apixaban (ELIQUIS) 5 MG TABS tablet Take 1 tablet (5 mg total) by mouth 2 (two) times daily.  Marland Kitchen atorvastatin (LIPITOR) 80 MG tablet Take 1 tablet (80 mg total) by mouth at bedtime.  . Calcium Carbonate-Vit D-Min (CALCIUM 1200 PO) Take 1 tablet by mouth at bedtime.   Marland Kitchen estrogens, conjugated, (PREMARIN) 0.3 MG tablet Take 1 tablet (0.3 mg total) by mouth every evening.  . fluticasone (FLONASE) 50 MCG/ACT nasal spray Place 2 sprays into both nostrils daily.  . hydrocortisone cream 1 % Apply sparingly to rectal once a day prn  . loperamide (IMODIUM) 2 MG capsule Take 2 mg by mouth daily as needed for diarrhea or loose stools.   . Maltodextrin-Xanthan Gum (RESOURCE THICKENUP CLEAR) POWD Take 120 g by mouth as needed (nectar thick liquids).  . metoprolol tartrate (LOPRESSOR) 100 MG tablet Take 1 tablet (100 mg total) by mouth 2 (two) times daily.  . nitroGLYCERIN  (NITROSTAT) 0.4 MG SL tablet Place 1 tablet (0.4 mg total) under the tongue every 5 (five) minutes as needed for chest pain.  . pantoprazole (PROTONIX) 40 MG tablet Take 40 mg by mouth daily.  . traMADol (ULTRAM) 50 MG tablet Take 1 tablet (50 mg total) by mouth every 6 (six) hours as needed for moderate pain or severe pain.  . Zinc 50 MG CAPS Take 50 mg by mouth daily.  OF NOTE-- she has been started on Zoloft currently 25 mg a day it will be titrated up to 50 mg a day third No facility-administered encounter medications on file as of 03/16/2018.     Review of systems.  In general she does not complain of fever chills.  Skin is not complaining of itching or rashes at this point.  Head ears eyes nose mouth and throat does not complain of visual changes or sore throat.  Respiratory is not complaining of having a cough or being short of breath.  Cardiac does not complain of chest pain does have a history of atrial fibrillation has some quite mild lower extremity edema.  GI is not complaining of abdominal pain   Musculoskeletal does complain of pain issues at times arm pain and back pain.  Neurologic is not complaining of dizziness or headache does complain of weakness.  And psych has had some history of dysphoric mood but appears to be in better spirits now that she has a private room andcurrently has her cat from home visiting as well  Physical exam.  Temperature is 97.3 pulse is 64 respirations of 18-blood pressure 136/60--appears recent pulses have been in the 50s-60s range I got 60s on  auscultation.  Blood pressures appear to run 130 to low 749S systolically  In general this is a fairly well-nourished elderly female in no distress lying comfortably in bed.  Her skin is warm and dry.  Eyes visual acuity appears to be grossly intact.  Chest is clear to auscultation with somewhat shallow air entry there is no labored breathing.  Heart is regular rate and rhythm without  murmur gallop or rub she has trace edema bilaterally.  Her abdomen is soft nontender with positive bowel sounds.  Musculoskeletal continues with edema of her left upper extremity- does have hemiplegia of her left upper and lower extremities.  Neurologic as noted above does have left-sided hemiplegia as well as a facial droop.  Psych she is alert and oriented pleasant and appropriate--appears to really enjoy having a private room as well as able to spend time with her Cat--    Labs.  March 14, 2018.  WBC 8.8 hemoglobin 10.8 platelets 465.  Sodium 139 potassium 4.1 BUN 17 creatinine 0.98-calcium 8.7.  Assessment and plan.  1.  Pain management-patient does not believe the PRN Tylenol or as needed tramadol which she can get every 6 hours is totally effective- will increase frequency of tramadol to every 4 hours as needed monitor.  Also will write an order to encourage elevation of the left arm secondary to the edema which appears to be dependent edema.  2.  History of CVA with right MCA and left-sided hemiparesis continues on Eliquis and a statin blood pressure appears to be stable at this point will need monitoring.  Will need follow-up by neurology.  3.  History of atrial fibrillation with rapid ventricular rate this appears rate controlled she is on Eliquis for anticoagulation on amiodarone and metoprolol for rate control noted at times she has pulses in the 50s which appear to be well tolerated I got a pulse in the 60s on exam.  She will have follow-up by cardiology.  4-- history of chronic kidney disease this appears stable with November 23 update BMP is ordered for Monday, December 2.  5 suspected history of depression she appears to be in better spirits she has been started on Zoloft and this will be titrated up to 50 mg on December 3  #6 questions about Premarin use- pharmacist has noted that there is increased risk of another CVA or possibly a DVT with its use-however I  did discuss this with patient and she is quite adamant she has been on this for an extended period of time and it does help with hot flashes and she wishes to continue    CPT [phone]

## 2018-03-18 NOTE — Telephone Encounter (Signed)
RX Fax for [facility]@ [phone]  

## 2018-03-23 ENCOUNTER — Encounter (HOSPITAL_COMMUNITY)
Admission: RE | Admit: 2018-03-23 | Discharge: 2018-03-23 | Disposition: A | Discharge status: Home / Self Care | Payer: Medicare Other | Source: Skilled Nursing Facility | Attending: Internal Medicine | Admitting: Internal Medicine

## 2018-03-23 DIAGNOSIS — I69392 Facial weakness following cerebral infarction: Secondary | ICD-10-CM | POA: Insufficient documentation

## 2018-03-23 DIAGNOSIS — I63511 Cerebral infarction due to unspecified occlusion or stenosis of right middle cerebral artery: Secondary | ICD-10-CM | POA: Insufficient documentation

## 2018-03-23 DIAGNOSIS — I639 Cerebral infarction, unspecified: Secondary | ICD-10-CM | POA: Insufficient documentation

## 2018-03-23 LAB — CBC WITH DIFFERENTIAL/PLATELET
Abs Immature Granulocytes: 0.01 10*3/uL (ref 0.00–0.07)
BASOS ABS: 0.1 10*3/uL (ref 0.0–0.1)
Basophils Relative: 2 %
Eosinophils Absolute: 0.3 10*3/uL (ref 0.0–0.5)
Eosinophils Relative: 7 %
HCT: 39.7 % (ref 36.0–46.0)
HEMOGLOBIN: 11.9 g/dL — AB (ref 12.0–15.0)
IMMATURE GRANULOCYTES: 0 %
LYMPHS ABS: 1.6 10*3/uL (ref 0.7–4.0)
LYMPHS PCT: 31 %
MCH: 27 pg (ref 26.0–34.0)
MCHC: 30 g/dL (ref 30.0–36.0)
MCV: 90.2 fL (ref 80.0–100.0)
Monocytes Absolute: 0.3 10*3/uL (ref 0.1–1.0)
Monocytes Relative: 7 %
NEUTROS PCT: 53 %
NRBC: 0 % (ref 0.0–0.2)
Neutro Abs: 2.8 10*3/uL (ref 1.7–7.7)
Platelets: 526 10*3/uL — ABNORMAL HIGH (ref 150–400)
RBC: 4.4 MIL/uL (ref 3.87–5.11)
RDW: 15.2 % (ref 11.5–15.5)
WBC: 5.2 10*3/uL (ref 4.0–10.5)

## 2018-03-23 LAB — HEPATIC FUNCTION PANEL
ALBUMIN: 2.8 g/dL — AB (ref 3.5–5.0)
ALT: 22 U/L (ref 0–44)
AST: 44 U/L — AB (ref 15–41)
Alkaline Phosphatase: 66 U/L (ref 38–126)
BILIRUBIN TOTAL: 0.7 mg/dL (ref 0.3–1.2)
Bilirubin, Direct: <0.1 mg/dL (ref 0.0–0.2)
TOTAL PROTEIN: 7 g/dL (ref 6.5–8.1)

## 2018-03-23 LAB — BASIC METABOLIC PANEL
ANION GAP: 9 (ref 5–15)
BUN: 28 mg/dL — ABNORMAL HIGH (ref 8–23)
CALCIUM: 9.2 mg/dL (ref 8.9–10.3)
CHLORIDE: 103 mmol/L (ref 98–111)
CO2: 28 mmol/L (ref 22–32)
Creatinine, Ser: 1.57 mg/dL — ABNORMAL HIGH (ref 0.44–1.00)
GFR calc non Af Amer: 30 mL/min — ABNORMAL LOW (ref >60)
GFR, EST AFRICAN AMERICAN: 34 mL/min — AB (ref >60)
Glucose, Bld: 127 mg/dL — ABNORMAL HIGH (ref 70–99)
Potassium: 4.1 mmol/L (ref 3.5–5.1)
Sodium: 140 mmol/L (ref 135–145)

## 2018-03-24 ENCOUNTER — Non-Acute Institutional Stay (SKILLED_NURSING_FACILITY): Payer: Medicare Other | Admitting: Internal Medicine

## 2018-03-24 ENCOUNTER — Other Ambulatory Visit: Payer: Self-pay

## 2018-03-24 ENCOUNTER — Other Ambulatory Visit: Payer: Self-pay | Admitting: *Deleted

## 2018-03-24 ENCOUNTER — Encounter: Payer: Self-pay | Admitting: Internal Medicine

## 2018-03-24 DIAGNOSIS — I63511 Cerebral infarction due to unspecified occlusion or stenosis of right middle cerebral artery: Secondary | ICD-10-CM

## 2018-03-24 DIAGNOSIS — M810 Age-related osteoporosis without current pathological fracture: Secondary | ICD-10-CM

## 2018-03-24 DIAGNOSIS — I4891 Unspecified atrial fibrillation: Secondary | ICD-10-CM

## 2018-03-24 DIAGNOSIS — I10 Essential (primary) hypertension: Secondary | ICD-10-CM | POA: Diagnosis not present

## 2018-03-24 MED ORDER — TRAMADOL HCL 50 MG PO TABS: 50.0000 mg | ORAL_TABLET | ORAL | 0 refills | Status: DC | PRN

## 2018-03-24 NOTE — Progress Notes (Signed)
Location:    [place] Room Number: 129/P Place of Service:  SNF (31) Provider: Full Code  [name], MD  Patient Care Team: [name], MD as PCP - General (Family Medicine) Harl Bowie Alphonse Guild, MD as PCP - Cardiology (Cardiology)  Extended Emergency Contact Information Primary Emergency Contact: Mooney,David Address: [address]          [place], [place] [address] Johnnette Litter of [place] Phone: [phone] Work Phone: [phone] Mobile Phone: [phone] Relation: Son Secondary Emergency Contact: Lovett Calender Home Phone: [phone] Relation: Other  Code Status:  Full Code Goals of care: Advanced Directive information Advanced Directives 03/24/2018  Does Patient Have a Medical Advance Directive? Yes  Type of Advance Directive (No Data)  Does patient want to make changes to medical advance directive? No - Patient declined  Would patient like information on creating a medical advance directive? No - Patient declined  Pre-existing out of facility DNR order (yellow form or pink MOST form) -     Chief Complaint  Patient presents with  . Acute Visit    Request stronger pain meds    HPI:  Pt is a 84 y.o. female seen today for an acute visit for continues complain of pain on her left upper and lower extremity She stayed in the hospital for Acute Right MCA Stroke with Atrial Fibrilatin From 11/11-11/22 Patient has h/o CAD, Hypertension, CKD, Chronic Atrial Fibrillation on Chronic anticoagulation, Hyperlipidemia, h/o PMR, Osteoporosis  She was brought to the hospital by her family when she had acute onset of left-sided weakness with facial droop and slurred speech.  She lives with her grandson who found her with above symptoms at home.   Her CT scan and later MRI showed Large right MCA Infarct without Hemorrhage or Mass effect. She also had Atrial Fibrillation with RVR with Rapid Ventricular response. Cardiology had to start her n  Loading doses of Amiodarone and incrase her Metoprolol to 100 mg. She converted to Sinus rhythm.  Her diltiazem was stopped. And her Xarelto was eventually switched to Eliquis  She had Doppler of her LUE which showed Superficial vein Thrombosis  Patient has been complaining of pain in her left side since she has been in the facility.  Her Ultram was increased to every 4 hours.  She says nothing seems to help. Back pain is still controlled with Ultram.  Her mood seems little better.  She was also started on Zoloft which was increased to 50 mg.  She is working with therapy.  Past Medical History:  Diagnosis Date  . Anemia   . CAD in native artery    a. NSTEMI 12/2005 s/p BMS to Cx, prior normal LVEF.  Marland Kitchen Cancer ([place]) 1988 and  2009   BCC nose and  Melanoma back  . Cancer of skin Jan. 2014   [facility] Forehead and Nose  . Carotid stenosis    mild  . Chronic back pain   . CKD (chronic kidney disease), stage III ([place])   . Coronary atherosclerosis of unspecified type of vessel, native or graft   . DJD (degenerative joint disease) of knee   . History of TIA (transient ischemic attack)   . HTN (hypertension)   . IBS (irritable bowel syndrome)   . Myocardial infarction [facility])    5 yrs ago     dr wall  cardiac  . Osteoporosis   . Other diseases of lung, not elsewhere classified    lung nodule  .  Polymyalgia rheumatica ([place])   . Shingles 01/2016-02/2016  . Stroke [facility])    Past Surgical History:  Procedure Laterality Date  . ABDOMINAL HYSTERECTOMY  1977   partial  . BACK SURGERY  1982  . CARDIAC CATHETERIZATION    . COLONOSCOPY N/A 01/27/2013   Procedure: COLONOSCOPY;  Surgeon: Rogene Houston, MD;  Location: [facility]O SUITE;  Service: Endoscopy;  Laterality: N/A;  1030  . EYE SURGERY Bilateral 04/2017  . EYE SURGERY Right 09/23/2017  . IR KYPHO LUMBAR INC FX REDUCE BONE BX UNI/BIL CANNULATION INC/IMAGING  09/30/2017  . IR VERTEBROPLASTY CERV/THOR BX INC UNI/BIL INC/INJECT/IMAGING  09/05/2017  .  JOINT REPLACEMENT  2001 and  2011   6 knee surgeries   bil replacement  . KNEE ARTHROPLASTY    . KNEE ARTHROSCOPY    . LUMBAR LAMINECTOMY    . PR VEIN BYPASS GRAFT,AORTO-FEM-POP  2010  . removal of melanoma from back  2007  . SPINE SURGERY  2013   Broken back: fusion surgery  . tonisllectomy  1944  . total knee anthroplasty    . VERTEBROPLASTY  07/11/2011   Procedure: VERTEBROPLASTY;  Surgeon: Faythe Ghee, MD;  Location: [facility] NEURO ORS;  Service: Neurosurgery;  Laterality: N/A;  Vertebroplasty of Lumbar Two    Allergies  Allergen Reactions  . Epinephrine Other (See Comments)    During dental procedure-caused facial swelling at injection site. Pt has had epinephrine since then for nose and facial surgery and did fine with it per pt.  . Clopidogrel Bisulfate Itching and Rash    Plavix  . Oxycodone     Hallucinations. Pt states that she can tolerate hydrocodone    Outpatient Encounter Medications as of 03/24/2018  Medication Sig  . Abaloparatide (TYMLOS) 3120 MCG/1.56ML SOPN Inject 80 mcg into the skin daily.  Marland Kitchen acetaminophen (TYLENOL) 500 MG tablet Take 500 mg by mouth every 6 (six) hours as needed for mild pain.  Marland Kitchen amiodarone (PACERONE) 200 MG tablet Take 200 mg by mouth daily.  Marland Kitchen amLODipine (NORVASC) 5 MG tablet Take 5 mg by mouth daily.  Marland Kitchen apixaban (ELIQUIS) 5 MG TABS tablet Take 1 tablet (5 mg total) by mouth 2 (two) times daily.  Marland Kitchen atorvastatin (LIPITOR) 80 MG tablet Take 1 tablet (80 mg total) by mouth at bedtime.  . Calcium Carbonate-Vit D-Min (CALCIUM 1200 PO) Take 1 tablet by mouth at bedtime.   Marland Kitchen estrogens, conjugated, (PREMARIN) 0.3 MG tablet Take 1 tablet (0.3 mg total) by mouth every evening.  . fluticasone (FLONASE) 50 MCG/ACT nasal spray Place 2 sprays into both nostrils daily.  . hydrocortisone cream 1 % Apply sparingly to rectal once a day prn  . loperamide (IMODIUM) 2 MG capsule Take 2 mg by mouth daily as needed for diarrhea or loose stools.   .  Maltodextrin-Xanthan Gum (RESOURCE THICKENUP CLEAR) POWD Take 120 g by mouth as needed (nectar thick liquids).  . metoprolol tartrate (LOPRESSOR) 100 MG tablet Take 1 tablet (100 mg total) by mouth 2 (two) times daily.  . nitroGLYCERIN (NITROSTAT) 0.4 MG SL tablet Place 1 tablet (0.4 mg total) under the tongue every 5 (five) minutes as needed for chest pain.  . pantoprazole (PROTONIX) 40 MG tablet Take 40 mg by mouth daily.  . sertraline (ZOLOFT) 50 MG tablet Take 50 mg by mouth daily.  . traMADol (ULTRAM) 50 MG tablet Take 50 mg by mouth every 4 (four) hours as needed.  . Zinc 50 MG CAPS Take 50 mg by mouth  daily.  . [DISCONTINUED] amiodarone (PACERONE) 200 MG tablet Take 1 tablet (200 mg total) by mouth 2 (two) times daily. (Patient taking differently: Take 200 mg by mouth daily. )  . [DISCONTINUED] amiodarone (PACERONE) 200 MG tablet Take 1 tablet (200 mg total) by mouth daily for 7 days.  . [DISCONTINUED] traMADol (ULTRAM) 50 MG tablet Take 1 tablet (50 mg total) by mouth every 6 (six) hours as needed for moderate pain or severe pain. (Patient taking differently: Take 50 mg by mouth every 4 (four) hours as needed for moderate pain or severe pain. )   No facility-administered encounter medications on file as of 03/24/2018.      Review of Systems  Constitutional: Positive for activity change. Negative for appetite change.  HENT: Negative.   Respiratory: Negative.   Cardiovascular: Negative.   Gastrointestinal: Negative.   Genitourinary: Negative.   Musculoskeletal: Positive for arthralgias and back pain.  Skin: Negative.   Neurological: Positive for weakness.  Psychiatric/Behavioral: Positive for dysphoric mood. Negative for sleep disturbance.    Immunization History  Administered Date(s) Administered  . Influenza,inj,Quad PF,6+ Mos 03/07/2015, 06/04/2016, 01/14/2017, 01/27/2018  . Influenza-Unspecified 02/20/2012, 01/12/2013, 01/28/2014  . Pneumococcal Conjugate-13 03/07/2003  .  Tdap 03/08/2013  . Zoster 01/04/2009   Pertinent  Health Maintenance Due  Topic Date Due  . PNA vac Low Risk Adult (2 of 2 - PPSV23) 04/08/2018 (Originally 03/06/2004)  . INFLUENZA VACCINE  Completed  . DEXA SCAN  Completed   Fall Risk  03/27/2017 12/26/2015  Falls in the past year? No No  Comment Emmi Telephone Survey: data to providers prior to load Emmi Telephone Survey: data to providers prior to load   Functional Status Survey:    Vitals:   03/24/18 1132  BP: (!) 143/66  Pulse: (!) 59  Resp: 20  Temp: 97.9 F (36.6 C)  TempSrc: Oral   There is no height or weight on file to calculate BMI. Physical Exam  Constitutional: She is oriented to person, place, and time. She appears well-developed and well-nourished.  HENT:  Head: Normocephalic.  Poor oral Hygeine  Eyes: Pupils are equal, round, and reactive to light.  Neck: Neck supple.  Cardiovascular: Normal rate and regular rhythm.  No murmur heard. Pulmonary/Chest: Effort normal and breath sounds normal. No stridor. No respiratory distress. She has no wheezes.  Abdominal: Soft. Bowel sounds are normal. She exhibits no distension. There is no tenderness. There is no guarding.  Musculoskeletal:  Trace edema Bilateral. Has edema in left UE  Neurological: She is alert and oriented to person, place, and time.  Has dense Hemiplegia in Left UE and LE. Was 0/5. Also had Facial Droop  Skin: Skin is warm and dry.  Psychiatric: Her behavior is normal. Thought content normal.  Patient Mood seems little better    Labs reviewed: Recent Labs    03/08/18 1019 03/10/18 1033  03/13/18 0318 03/14/18 0415 03/23/18 0700  NA 137 138   < > 137 139 140  K 4.0 4.0   < > 4.1 4.1 4.1  CL 111 110   < > 108 108 103  CO2 19* 21*   < > 22 24 28   GLUCOSE 106* 111*   < > 98 97 127*  BUN 17 18   < > 20 17 28*  CREATININE 0.86 1.18*   < > 1.22* 0.98 1.57*  CALCIUM 8.5* 8.8*   < > 8.5* 8.7* 9.2  MG 1.7 2.0  --  1.8  --   --    < > =  values  in this interval not displayed.   Recent Labs    10/26/17 0647 11/03/17 1228 03/02/18 1729 03/23/18 0700  AST 16 21 20  44*  ALT 10 13 12 22   ALKPHOS 68  --  83 66  BILITOT 1.1 0.3 0.9 0.7  PROT 6.4* 6.7  6.6 7.6 7.0  ALBUMIN 2.8*  --  3.6 2.8*   Recent Labs    03/13/18 0318 03/14/18 0415 03/23/18 0700  WBC 8.6 8.8 5.2  NEUTROABS 4.7 5.2 2.8  HGB 10.6* 10.8* 11.9*  HCT 35.0* 36.5 39.7  MCV 88.6 89.5 90.2  PLT 464* 465* 526*   Lab Results  Component Value Date   TSH 0.883 03/10/2018   Lab Results  Component Value Date   HGBA1C 5.5 03/03/2018   Lab Results  Component Value Date   CHOL 148 03/03/2018   HDL 61 03/03/2018   LDLCALC 65 03/03/2018   TRIG 109 03/03/2018   CHOLHDL 2.4 03/03/2018    Significant Diagnostic Results in last 30 days:  Ct Angio Head W Or Wo Contrast  Result Date: 03/02/2018 CLINICAL DATA:  Left-sided weakness EXAM: CT ANGIOGRAPHY HEAD AND NECK CT PERFUSION BRAIN TECHNIQUE: Multidetector CT imaging of the head and neck was performed using the standard protocol during bolus administration of intravenous contrast. Multiplanar CT image reconstructions and MIPs were obtained to evaluate the vascular anatomy. Carotid stenosis measurements (when applicable) are obtained utilizing NASCET criteria, using the distal internal carotid diameter as the denominator. Multiphase CT imaging of the brain was performed following IV bolus contrast injection. Subsequent parametric perfusion maps were calculated using RAPID software. CONTRAST:  64mL ISOVUE-370 IOPAMIDOL (ISOVUE-370) INJECTION 76% COMPARISON:  None. FINDINGS: CTA NECK FINDINGS SKELETON: There is no bony spinal canal stenosis. No lytic or blastic lesion. OTHER NECK: Normal pharynx, larynx and major salivary glands. No cervical lymphadenopathy. Unremarkable thyroid gland. UPPER CHEST: No pneumothorax or pleural effusion. No nodules or masses. AORTIC ARCH: There is mild calcific atherosclerosis of the aortic  arch. There is no aneurysm, dissection or hemodynamically significant stenosis of the visualized ascending aorta and aortic arch. Conventional 3 vessel aortic branching pattern. The visualized proximal subclavian arteries are widely patent. RIGHT CAROTID SYSTEM: --Common carotid artery: Widely patent origin without common carotid artery dissection or aneurysm. --Internal carotid artery: No dissection, occlusion or aneurysm. There is mixed density atherosclerosis extending into the proximal ICA, resulting in less than 50% stenosis. --External carotid artery: No acute abnormality. LEFT CAROTID SYSTEM: --Common carotid artery: Widely patent origin without common carotid artery dissection or aneurysm. --Internal carotid artery: No dissection, occlusion or aneurysm. There is mixed density atherosclerosis extending into the proximal ICA, resulting in less than 50% stenosis. --External carotid artery: No acute abnormality. VERTEBRAL ARTERIES: Left dominant configuration. There is moderate narrowing of both vertebral artery origins. No dissection, occlusion or flow-limiting stenosis to the vertebrobasilar confluence. CTA HEAD FINDINGS ANTERIOR CIRCULATION: --Intracranial internal carotid arteries: Atherosclerotic calcification of the internal carotid arteries at the skull base without hemodynamically significant stenosis. --Anterior cerebral arteries: Normal. Both A1 segments are present. Patent anterior communicating artery. --Middle cerebral arteries: There is a short segment occlusion of the proximal right M2 segment. The length of the occluded segment is approximately 5 mm. --Posterior communicating arteries: Present bilaterally. POSTERIOR CIRCULATION: --Basilar artery: Normal. --Posterior cerebral arteries: Normal. --Superior cerebellar arteries: Normal. --Inferior cerebellar arteries: Normal anterior and posterior inferior cerebellar arteries. VENOUS SINUSES: As permitted by contrast timing, patent. ANATOMIC VARIANTS:  None DELAYED PHASE: No parenchymal contrast enhancement. Review of  the MIP images confirms the above findings. ASPECTS ([place] Stroke Program Early CT Score) - Ganglionic level infarction (caudate, lentiform nuclei, internal capsule, insula, M1-M3 cortex): 5 - Supraganglionic infarction (M4-M6 cortex): 1 Total score (0-10 with 10 being normal): 6 CT Brain Perfusion Findings: CBF (<30%) Volume: 3mL Perfusion (Tmax>6.0s) volume: 56mL Mismatch Volume: 29mL Infarction Location:Posterior right MCA territory IMPRESSION: 1. Short segment occlusion of the right middle cerebral artery proximal M2 segment. 2. 21 mL region of ischemia in the posterior right MCA territory by CT perfusion criteria. The CT perfusion data does not demonstrate a core infarct, but the ASPECT Score is 6 with findings of early infarction in the posterior right MCA territory. 3. Bilateral carotid bifurcation atherosclerosis with less than 50% stenosis. 4.  Aortic atherosclerosis (ICD10-I70.0). These results were called by telephone at the time of interpretation on 03/02/2018 at 6:07 pm to Dr. Daleen Bo , who verbally acknowledged these results. Electronically Signed   By: Ulyses Jarred M.D.   On: 03/02/2018 18:25   Ct Angio Neck W And/or Wo Contrast  Result Date: 03/02/2018 CLINICAL DATA:  Left-sided weakness EXAM: CT ANGIOGRAPHY HEAD AND NECK CT PERFUSION BRAIN TECHNIQUE: Multidetector CT imaging of the head and neck was performed using the standard protocol during bolus administration of intravenous contrast. Multiplanar CT image reconstructions and MIPs were obtained to evaluate the vascular anatomy. Carotid stenosis measurements (when applicable) are obtained utilizing NASCET criteria, using the distal internal carotid diameter as the denominator. Multiphase CT imaging of the brain was performed following IV bolus contrast injection. Subsequent parametric perfusion maps were calculated using RAPID software. CONTRAST:  26mL ISOVUE-370  IOPAMIDOL (ISOVUE-370) INJECTION 76% COMPARISON:  None. FINDINGS: CTA NECK FINDINGS SKELETON: There is no bony spinal canal stenosis. No lytic or blastic lesion. OTHER NECK: Normal pharynx, larynx and major salivary glands. No cervical lymphadenopathy. Unremarkable thyroid gland. UPPER CHEST: No pneumothorax or pleural effusion. No nodules or masses. AORTIC ARCH: There is mild calcific atherosclerosis of the aortic arch. There is no aneurysm, dissection or hemodynamically significant stenosis of the visualized ascending aorta and aortic arch. Conventional 3 vessel aortic branching pattern. The visualized proximal subclavian arteries are widely patent. RIGHT CAROTID SYSTEM: --Common carotid artery: Widely patent origin without common carotid artery dissection or aneurysm. --Internal carotid artery: No dissection, occlusion or aneurysm. There is mixed density atherosclerosis extending into the proximal ICA, resulting in less than 50% stenosis. --External carotid artery: No acute abnormality. LEFT CAROTID SYSTEM: --Common carotid artery: Widely patent origin without common carotid artery dissection or aneurysm. --Internal carotid artery: No dissection, occlusion or aneurysm. There is mixed density atherosclerosis extending into the proximal ICA, resulting in less than 50% stenosis. --External carotid artery: No acute abnormality. VERTEBRAL ARTERIES: Left dominant configuration. There is moderate narrowing of both vertebral artery origins. No dissection, occlusion or flow-limiting stenosis to the vertebrobasilar confluence. CTA HEAD FINDINGS ANTERIOR CIRCULATION: --Intracranial internal carotid arteries: Atherosclerotic calcification of the internal carotid arteries at the skull base without hemodynamically significant stenosis. --Anterior cerebral arteries: Normal. Both A1 segments are present. Patent anterior communicating artery. --Middle cerebral arteries: There is a short segment occlusion of the proximal right M2  segment. The length of the occluded segment is approximately 5 mm. --Posterior communicating arteries: Present bilaterally. POSTERIOR CIRCULATION: --Basilar artery: Normal. --Posterior cerebral arteries: Normal. --Superior cerebellar arteries: Normal. --Inferior cerebellar arteries: Normal anterior and posterior inferior cerebellar arteries. VENOUS SINUSES: As permitted by contrast timing, patent. ANATOMIC VARIANTS: None DELAYED PHASE: No parenchymal contrast enhancement. Review of the MIP  images confirms the above findings. ASPECTS ([place] Stroke Program Early CT Score) - Ganglionic level infarction (caudate, lentiform nuclei, internal capsule, insula, M1-M3 cortex): 5 - Supraganglionic infarction (M4-M6 cortex): 1 Total score (0-10 with 10 being normal): 6 CT Brain Perfusion Findings: CBF (<30%) Volume: 89mL Perfusion (Tmax>6.0s) volume: 88mL Mismatch Volume: 65mL Infarction Location:Posterior right MCA territory IMPRESSION: 1. Short segment occlusion of the right middle cerebral artery proximal M2 segment. 2. 21 mL region of ischemia in the posterior right MCA territory by CT perfusion criteria. The CT perfusion data does not demonstrate a core infarct, but the ASPECT Score is 6 with findings of early infarction in the posterior right MCA territory. 3. Bilateral carotid bifurcation atherosclerosis with less than 50% stenosis. 4.  Aortic atherosclerosis (ICD10-I70.0). These results were called by telephone at the time of interpretation on 03/02/2018 at 6:07 pm to Dr. Daleen Bo , who verbally acknowledged these results. Electronically Signed   By: Ulyses Jarred M.D.   On: 03/02/2018 18:25   Mr Brain Wo Contrast  Result Date: 03/03/2018 CLINICAL DATA:  Stroke follow-up EXAM: MRI HEAD WITHOUT CONTRAST TECHNIQUE: Multiplanar, multiecho pulse sequences of the brain and surrounding structures were obtained without intravenous contrast. COMPARISON:  None. FINDINGS: BRAIN: Large area of acute ischemia within the  right MCA territory, involving much of the frontal and parietal cortex and the right insula. No other diffusion abnormality. The midline structures are normal. No midline shift or other mass effect. There are old bilateral cerebellar infarcts and old infarcts of the right basal ganglia and thalamus. Early confluent hyperintense T2-weighted signal of the periventricular and deep white matter, most commonly due to chronic ischemic microangiopathy. There is cytotoxic edema throughout the distribution of the ischemia. The cerebral and cerebellar volume are age-appropriate. 2-3 scattered foci of chronic microhemorrhage. VASCULAR: Major intracranial arterial and venous sinus flow voids are normal. SKULL AND UPPER CERVICAL SPINE: Calvarial bone marrow signal is normal. There is no skull base mass. Visualized upper cervical spine and soft tissues are normal. SINUSES/ORBITS: No fluid levels or advanced mucosal thickening. No mastoid or middle ear effusion. The orbits are normal. IMPRESSION: 1. Large right MCA territory infarct without hemorrhage or mass effect. 2. Findings of chronic ischemic microangiopathy with multiple small vessel infarcts. Electronically Signed   By: Ulyses Jarred M.D.   On: 03/03/2018 22:23   Ct Cerebral Perfusion W Contrast  Result Date: 03/02/2018 CLINICAL DATA:  Left-sided weakness EXAM: CT ANGIOGRAPHY HEAD AND NECK CT PERFUSION BRAIN TECHNIQUE: Multidetector CT imaging of the head and neck was performed using the standard protocol during bolus administration of intravenous contrast. Multiplanar CT image reconstructions and MIPs were obtained to evaluate the vascular anatomy. Carotid stenosis measurements (when applicable) are obtained utilizing NASCET criteria, using the distal internal carotid diameter as the denominator. Multiphase CT imaging of the brain was performed following IV bolus contrast injection. Subsequent parametric perfusion maps were calculated using RAPID software. CONTRAST:   59mL ISOVUE-370 IOPAMIDOL (ISOVUE-370) INJECTION 76% COMPARISON:  None. FINDINGS: CTA NECK FINDINGS SKELETON: There is no bony spinal canal stenosis. No lytic or blastic lesion. OTHER NECK: Normal pharynx, larynx and major salivary glands. No cervical lymphadenopathy. Unremarkable thyroid gland. UPPER CHEST: No pneumothorax or pleural effusion. No nodules or masses. AORTIC ARCH: There is mild calcific atherosclerosis of the aortic arch. There is no aneurysm, dissection or hemodynamically significant stenosis of the visualized ascending aorta and aortic arch. Conventional 3 vessel aortic branching pattern. The visualized proximal subclavian arteries are widely patent. RIGHT CAROTID  SYSTEM: --Common carotid artery: Widely patent origin without common carotid artery dissection or aneurysm. --Internal carotid artery: No dissection, occlusion or aneurysm. There is mixed density atherosclerosis extending into the proximal ICA, resulting in less than 50% stenosis. --External carotid artery: No acute abnormality. LEFT CAROTID SYSTEM: --Common carotid artery: Widely patent origin without common carotid artery dissection or aneurysm. --Internal carotid artery: No dissection, occlusion or aneurysm. There is mixed density atherosclerosis extending into the proximal ICA, resulting in less than 50% stenosis. --External carotid artery: No acute abnormality. VERTEBRAL ARTERIES: Left dominant configuration. There is moderate narrowing of both vertebral artery origins. No dissection, occlusion or flow-limiting stenosis to the vertebrobasilar confluence. CTA HEAD FINDINGS ANTERIOR CIRCULATION: --Intracranial internal carotid arteries: Atherosclerotic calcification of the internal carotid arteries at the skull base without hemodynamically significant stenosis. --Anterior cerebral arteries: Normal. Both A1 segments are present. Patent anterior communicating artery. --Middle cerebral arteries: There is a short segment occlusion of the  proximal right M2 segment. The length of the occluded segment is approximately 5 mm. --Posterior communicating arteries: Present bilaterally. POSTERIOR CIRCULATION: --Basilar artery: Normal. --Posterior cerebral arteries: Normal. --Superior cerebellar arteries: Normal. --Inferior cerebellar arteries: Normal anterior and posterior inferior cerebellar arteries. VENOUS SINUSES: As permitted by contrast timing, patent. ANATOMIC VARIANTS: None DELAYED PHASE: No parenchymal contrast enhancement. Review of the MIP images confirms the above findings. ASPECTS ([place] Stroke Program Early CT Score) - Ganglionic level infarction (caudate, lentiform nuclei, internal capsule, insula, M1-M3 cortex): 5 - Supraganglionic infarction (M4-M6 cortex): 1 Total score (0-10 with 10 being normal): 6 CT Brain Perfusion Findings: CBF (<30%) Volume: 54mL Perfusion (Tmax>6.0s) volume: 2mL Mismatch Volume: 25mL Infarction Location:Posterior right MCA territory IMPRESSION: 1. Short segment occlusion of the right middle cerebral artery proximal M2 segment. 2. 21 mL region of ischemia in the posterior right MCA territory by CT perfusion criteria. The CT perfusion data does not demonstrate a core infarct, but the ASPECT Score is 6 with findings of early infarction in the posterior right MCA territory. 3. Bilateral carotid bifurcation atherosclerosis with less than 50% stenosis. 4.  Aortic atherosclerosis (ICD10-I70.0). These results were called by telephone at the time of interpretation on 03/02/2018 at 6:07 pm to Dr. Daleen Bo , who verbally acknowledged these results. Electronically Signed   By: Ulyses Jarred M.D.   On: 03/02/2018 18:25   Dg Chest Port 1 View  Result Date: 03/08/2018 CLINICAL DATA:  Developing cough EXAM: PORTABLE CHEST 1 VIEW COMPARISON:  10/24/17 FINDINGS: Cardiac shadow is within normal limits. Aortic calcifications are again seen. The lungs are well aerated bilaterally. Minimal left basilar atelectasis is seen. No  significant pulmonary edema is noted. No bony abnormality is seen. IMPRESSION: Mild left basilar atelectasis. Electronically Signed   By: Inez Catalina M.D.   On: 03/08/2018 09:37   Dg Swallowing Func-speech Pathology  Result Date: 03/05/2018 Objective Swallowing Evaluation: Type of Study: MBS-Modified Barium Swallow Study  Patient Details Name: Quin H Mooney-Riggs MRN: [id number] Date of Birth: 11/22/1930 Today's Date: 03/05/2018 Time: SLP Start Time (ACUTE ONLY): 0945 -SLP Stop Time (ACUTE ONLY): 1010 SLP Time Calculation (min) (ACUTE ONLY): 25 min Past Medical History: Past Medical History: Diagnosis Date . Anemia  . CAD in native artery   a. NSTEMI 12/2005 s/p BMS to Cx, prior normal LVEF. Marland Kitchen Cancer ([place]) 1988 and  2009  BCC nose and  Melanoma back . Cancer of skin Jan. 2014  [facility] Forehead and Nose . Carotid stenosis   mild . Chronic back pain  . CKD (chronic  kidney disease), stage III ([place])  . Coronary atherosclerosis of unspecified type of vessel, native or graft  . DJD (degenerative joint disease) of knee  . History of TIA (transient ischemic attack)  . HTN (hypertension)  . IBS (irritable bowel syndrome)  . Myocardial infarction [facility])   5 yrs ago     dr wall  cardiac . Osteoporosis  . Other diseases of lung, not elsewhere classified   lung nodule . Polymyalgia rheumatica ([place])  . Shingles 01/2016-02/2016 . Stroke [facility])  Past Surgical History: Past Surgical History: Procedure Laterality Date . ABDOMINAL HYSTERECTOMY  1977  partial . BACK SURGERY  1982 . CARDIAC CATHETERIZATION   . COLONOSCOPY N/A 01/27/2013  Procedure: COLONOSCOPY;  Surgeon: Rogene Houston, MD;  [facility];  Service: Endoscopy;  Laterality: N/A;  1030 . EYE SURGERY Bilateral 04/2017 . EYE SURGERY Right 09/23/2017 . IR KYPHO LUMBAR INC FX REDUCE BONE BX UNI/BIL CANNULATION INC/IMAGING  09/30/2017 . IR VERTEBROPLASTY CERV/THOR BX INC UNI/BIL INC/INJECT/IMAGING  09/05/2017 . JOINT REPLACEMENT  2001 and  2011  6 knee surgeries   bil  replacement . KNEE ARTHROPLASTY   . KNEE ARTHROSCOPY   . LUMBAR LAMINECTOMY   . PR VEIN BYPASS GRAFT,AORTO-FEM-POP  2010 . removal of melanoma from back  2007 . SPINE SURGERY  2013  Broken back: fusion surgery . tonisllectomy  1944 . total knee anthroplasty   . VERTEBROPLASTY  07/11/2011  Procedure: VERTEBROPLASTY;  Surgeon: Faythe Ghee, MD;  Location: [place] NEURO ORS;  Service: Neurosurgery;  Laterality: N/A;  Vertebroplasty of Lumbar Two HPI: 84 yo female adm [facility] with left sided weakness (dense hemiplegia), dysarthria and AMS.  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 PMH + for shingles, PMR, TIA, CAD, recent viral infection- s/p Zpac use per pt.  Swallow and speech evaluation ordered.  Pt did not pass the [place] swallow screen.   Subjective: Im going to disown my children, they wont give me any water Assessment / Plan / Recommendation CHL IP CLINICAL IMPRESSIONS 03/05/2018 Clinical Impression  Pt demonstrates a moderate oral dysphagia complicated by pt impulsivity, poor awarness and distracitbility. When total assist was given pt was able to seal right side of lips on spoon or straw. Anterior spill more significant with cup; pt often began talking with her mouth full of liquid as well. It verbal cues and extra time pt was able to lingually propel 90% of bolus to pyriforms with moderate left oral residuals. Increased cueing with puree needed. There was a significant delay in swallow initiation with pooled liquid in the pyriforms for several seconds. With thin, this resulted in sensed aspiration before the swallow. Only very trace sensed penetration of nectar thick liquids observed after the swallow of mild residue mixing with secretions. Depsite deficits, pt tolerated nectar and puree quite well during study and appears to be able to sense, cough and eject trace penetrates that are likely to occur intermittently. Also of concern was signs of esophageal dysmotility on esophageal sweep (no radiologist present to confirm);  esophageal precautions warranted. Will initiate Dys 1/nectar thick diet and monitor for tolerance and educate family and staff about precautions.  SLP Visit Diagnosis Dysphagia, oropharyngeal phase (R13.12) Attention and concentration deficit following -- Frontal lobe and executive function deficit following -- Impact on safety and function Moderate aspiration risk   CHL IP TREATMENT RECOMMENDATION 03/05/2018 Treatment Recommendations Therapy as outlined in treatment plan below   Prognosis 03/03/2018 Prognosis for Safe Diet Advancement Guarded Barriers to Reach  Goals Severity of deficits;Behavior Barriers/Prognosis Comment -- CHL IP DIET RECOMMENDATION 03/05/2018 SLP Diet Recommendations Dysphagia 1 (Puree) solids;Nectar thick liquid Liquid Administration via [employer] Medication Administration Crushed with puree Compensations Slow rate;Small sips/bites;Minimize environmental distractions;Monitor for anterior loss;Lingual sweep for clearance of pocketing;Clear throat intermittently Postural Changes Seated upright at 90 degrees;Remain semi-upright after after feeds/meals (Comment)   CHL IP OTHER RECOMMENDATIONS 03/05/2018 Recommended Consults -- Oral Care Recommendations Oral care before and after PO Other Recommendations Have oral suction available   CHL IP FOLLOW UP RECOMMENDATIONS 03/03/2018 Follow up Recommendations (No Data)   CHL IP FREQUENCY AND DURATION 03/05/2018 Speech Therapy Frequency (ACUTE ONLY) min 2x/week Treatment Duration 2 weeks      CHL IP ORAL PHASE 03/05/2018 Oral Phase Impaired Oral - Pudding Teaspoon -- Oral - Pudding Cup -- Oral - Honey Teaspoon -- Oral - Honey Cup -- Oral - Nectar Teaspoon Left anterior bolus loss;Weak lingual manipulation;Reduced posterior propulsion;Left pocketing in lateral sulci;Pocketing in anterior sulcus;Delayed oral transit;Decreased bolus cohesion;Premature spillage Oral - Nectar Cup -- Oral - Nectar Straw Left anterior bolus loss;Weak lingual manipulation;Reduced  posterior propulsion;Left pocketing in lateral sulci;Pocketing in anterior sulcus;Delayed oral transit;Decreased bolus cohesion;Premature spillage Oral - Thin Teaspoon -- Oral - Thin Cup -- Oral - Thin Straw Left anterior bolus loss;Weak lingual manipulation;Reduced posterior propulsion;Left pocketing in lateral sulci;Pocketing in anterior sulcus;Delayed oral transit;Decreased bolus cohesion;Premature spillage Oral - Puree Left anterior bolus loss;Weak lingual manipulation;Reduced posterior propulsion;Left pocketing in lateral sulci;Pocketing in anterior sulcus;Delayed oral transit;Decreased bolus cohesion;Premature spillage Oral - Mech Soft -- Oral - Regular -- Oral - Multi-Consistency -- Oral - Pill -- Oral Phase - Comment --  CHL IP PHARYNGEAL PHASE 03/05/2018 Pharyngeal Phase Impaired Pharyngeal- Pudding Teaspoon -- Pharyngeal -- Pharyngeal- Pudding Cup -- Pharyngeal -- Pharyngeal- Honey Teaspoon -- Pharyngeal -- Pharyngeal- Honey Cup -- Pharyngeal -- Pharyngeal- Nectar Teaspoon Delayed swallow initiation-pyriform sinuses Pharyngeal -- Pharyngeal- Nectar Cup -- Pharyngeal -- Pharyngeal- Nectar Straw Delayed swallow initiation-pyriform sinuses;Penetration/Apiration after swallow;Pharyngeal residue - valleculae;Pharyngeal residue - pyriform Pharyngeal Material enters airway, CONTACTS cords and then ejected out;Material does not enter airway Pharyngeal- Thin Teaspoon -- Pharyngeal -- Pharyngeal- Thin Cup -- Pharyngeal -- Pharyngeal- Thin Straw Delayed swallow initiation-pyriform sinuses;Pharyngeal residue - valleculae;Pharyngeal residue - pyriform;Penetration/Aspiration before swallow;Trace aspiration Pharyngeal Material enters airway, passes BELOW cords then ejected out Pharyngeal- Puree Delayed swallow initiation-vallecula Pharyngeal -- Pharyngeal- Mechanical Soft -- Pharyngeal -- Pharyngeal- Regular -- Pharyngeal -- Pharyngeal- Multi-consistency -- Pharyngeal -- Pharyngeal- Pill -- Pharyngeal -- Pharyngeal  Comment --  No flowsheet data found. Herbie Baltimore, MA CCC-SLP Acute Rehabilitation Services Pager [phone] Office [phone] Lynann Beaver 03/05/2018, 10:35 AM              Ct Head Code Stroke Wo Contrast  Result Date: 03/02/2018 CLINICAL DATA:  Code stroke.  Left-sided weakness EXAM: CT HEAD WITHOUT CONTRAST TECHNIQUE: Contiguous axial images were obtained from the base of the skull through the vertex without intravenous contrast. COMPARISON:  None. FINDINGS: Brain: There is no mass, hemorrhage or extra-axial collection. The size and configuration of the ventricles and extra-axial CSF spaces are normal. There is hypoattenuation within the posterior right MCA territory suggesting acute or early subacute infarct. No midline shift or other mass effect. 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 Vascular: Mild hyperattenuation within the right MCA M2 segment. Bilateral distal ICA atherosclerotic calcification. Skull: The visualized skull base, calvarium and extracranial soft tissues are normal. Sinuses/Orbits: No fluid levels or advanced mucosal  thickening of the visualized paranasal sinuses. No mastoid or middle ear effusion. The orbits are normal. ASPECTS [facility] Stroke Program Early CT Score) - Ganglionic level infarction (caudate, lentiform nuclei, internal capsule, insula, M1-M3 cortex): 5 - Supraganglionic infarction (M4-M6 cortex): 1 Total score (0-10 with 10 being normal): 6 IMPRESSION: 1. No acute hemorrhage. 2. Hypoattenuation in the posterior right MCA territory consistent with acute or early subacute infarct. 3. ASPECTS is 6. These results were called by telephone at the time of interpretation on 03/02/2018 at 5:47 pm to Dr. Daleen Bo , who verbally acknowledged these results. Electronically Signed   By: Ulyses Jarred M.D.   On: 03/02/2018 17:53   Vas [place] Upper Extremity Venous Duplex  Result Date: 03/10/2018 UPPER VENOUS  STUDY  Indications: Swelling Performing Technologist: Oliver Hum RVT  Examination Guidelines: A complete evaluation includes B-mode imaging, spectral Doppler, color Doppler, and power Doppler as needed of all accessible portions of each vessel. Bilateral testing is considered an integral part of a complete examination. Limited examinations for reoccurring indications may be performed as noted.  Right Findings: +----------+------------+----------+---------+-----------+-------+ RIGHT     CompressiblePropertiesPhasicitySpontaneousSummary +----------+------------+----------+---------+-----------+-------+ Subclavian    Full                 Yes       Yes            +----------+------------+----------+---------+-----------+-------+  Left Findings: +----------+------------+----------+---------+-----------+-----------------+ LEFT      CompressiblePropertiesPhasicitySpontaneous     Summary      +----------+------------+----------+---------+-----------+-----------------+ IJV           Full                 Yes       Yes                      +----------+------------+----------+---------+-----------+-----------------+ Subclavian    Full                 Yes       Yes                      +----------+------------+----------+---------+-----------+-----------------+ Axillary      Full                 Yes       Yes                      +----------+------------+----------+---------+-----------+-----------------+ Brachial      Full                 Yes       Yes                      +----------+------------+----------+---------+-----------+-----------------+ Radial        Full                                                    +----------+------------+----------+---------+-----------+-----------------+ Ulnar         Full                                                    +----------+------------+----------+---------+-----------+-----------------+ Cephalic  Partial                                  Age Indeterminate +----------+------------+----------+---------+-----------+-----------------+ Basilic       Full                                                    +----------+------------+----------+---------+-----------+-----------------+  Summary:  Right: No evidence of thrombosis in the subclavian.  Left: No evidence of deep vein thrombosis in the upper extremity. Findings consistent with age indeterminate superficial vein thrombosis involving the left cephalic vein.  *See table(s) above for measurements and observations.  Diagnosing physician: Deitra Mayo MD Electronically signed by Deitra Mayo MD on 03/10/2018 at 3:48:38 PM.    Final     Assessment/Plan Neuropathic Pain of Hemiplegic Extremity Will start Patient on Neurontin  Continue on Ultram Possible Baclofen if Pain Continues  Cerebrovascular accident (CVA) with Right MCA with Left Hemiparesis On Eliquis now.Switched from Xarelto On statin BP controlled Follow up with Neuro Essential hypertension BP stable on Norvasc and Metoprolol Atrial fibrillation with RVR  Xarelto switched to Eliquis 2D Echo showed Ef of 65% On Amiodarone and Metoprolol Rate Controlled Follow up with Cardiology  CKD (chronic kidney disease), stage III  Creat stable  Hyperlipidemia, On high Dose of statin H/o PMR Not on any Meds right now Osteoporosis On Tymlos  Depression On Zoloft 50 Mg now Dysphagia Continue on Honey Thick Advance Care Planning Patient told me she does not want any aggressive measures. d/w the son .  Also d/w the Social worker  Son says he wants to continue everything for now and he will make his decision depending on her Progress with therapy  Family/ staff Communication:   Labs/tests ordered:   Total time spent in this patient care encounter was 45_ minutes; greater than 50% of the visit spent counseling patient, reviewing records , Labs and coordinating  care for problems addressed at this encounter.

## 2018-03-24 NOTE — Telephone Encounter (Signed)
RX Fax for [facility]@ [phone]  

## 2018-03-24 NOTE — Patient Outreach (Signed)
[facility]) Care Management  03/24/2018  Brenda Hall Apr 29, 1930 [id number]   Onsite IDT meeting at [facility]. Facility staff report patient has had some agitation, MD has changed some medications.  Patient is getting Pt/OT/ST She is a 2 person transfer.  Staff is planning to set up a care plan meeting for this week with family.   Attempted to meet with patient, she was asleep, did not disturb. Plan to continue to follow and assess for any [facility] care management services.   Royetta Crochet. Laymond Purser, RN, BSN, [place] [phone]) Business Cell  [phone]) Toll Free Office

## 2018-03-25 NOTE — Progress Notes (Deleted)
Cardiology Office Note   Date:  03/25/2018   ID:  Tanza Raelea, Gosse October 20, 1930, MRN [id number]  PCP:  [name], MD  Cardiologist:   No chief complaint on file.    History of Present Illness: Brenda Hall is a 84 y.o. female who presents for post hospital follow up after admission for acute MCA infarct on the right side, EF of 65%-70%, with sclerotic AoV, and PAF. He was placed on amiodarone 200 mg BID, apixaban 5 mg BID,  and metoprolol.     Past Medical History:  Diagnosis Date  . Anemia   . CAD in native artery    a. NSTEMI 12/2005 s/p BMS to Cx, prior normal LVEF.  Marland Kitchen Cancer ([place]) 1988 and  2009   BCC nose and  Melanoma back  . Cancer of skin Jan. 2014   [facility] Forehead and Nose  . Carotid stenosis    mild  . Chronic back pain   . CKD (chronic kidney disease), stage III ([place])   . Coronary atherosclerosis of unspecified type of vessel, native or graft   . DJD (degenerative joint disease) of knee   . History of TIA (transient ischemic attack)   . HTN (hypertension)   . IBS (irritable bowel syndrome)   . Myocardial infarction [facility])    5 yrs ago     dr wall  cardiac  . Osteoporosis   . Other diseases of lung, not elsewhere classified    lung nodule  . Polymyalgia rheumatica ([place])   . Shingles 01/2016-02/2016  . Stroke [facility])     Past Surgical History:  Procedure Laterality Date  . ABDOMINAL HYSTERECTOMY  1977   partial  . BACK SURGERY  1982  . CARDIAC CATHETERIZATION    . COLONOSCOPY N/A 01/27/2013   Procedure: COLONOSCOPY;  Surgeon: Rogene Houston, MD;  [facility];  Service: Endoscopy;  Laterality: N/A;  1030  . EYE SURGERY Bilateral 04/2017  . EYE SURGERY Right 09/23/2017  . IR KYPHO LUMBAR INC FX REDUCE BONE BX UNI/BIL CANNULATION INC/IMAGING  09/30/2017  . IR VERTEBROPLASTY CERV/THOR BX INC UNI/BIL INC/INJECT/IMAGING  09/05/2017  . JOINT REPLACEMENT  2001 and  2011   6 knee surgeries   bil replacement  . KNEE ARTHROPLASTY    .  KNEE ARTHROSCOPY    . LUMBAR LAMINECTOMY    . PR VEIN BYPASS GRAFT,AORTO-FEM-POP  2010  . removal of melanoma from back  2007  . SPINE SURGERY  2013   Broken back: fusion surgery  . tonisllectomy  1944  . total knee anthroplasty    . VERTEBROPLASTY  07/11/2011   Procedure: VERTEBROPLASTY;  Surgeon: Faythe Ghee, MD;  Location: [place] NEURO ORS;  Service: Neurosurgery;  Laterality: N/A;  Vertebroplasty of Lumbar Two     Current Outpatient Medications  Medication Sig Dispense Refill  . traMADol (ULTRAM) 50 MG tablet Take 1 tablet (50 mg total) by mouth every 4 (four) hours as needed. 60 tablet 0   No current facility-administered medications for this visit.     Allergies:   Epinephrine; Clopidogrel bisulfate; and Oxycodone    Social History:  The patient  reports that she has never smoked. She has never used smokeless tobacco. She reports that she drinks alcohol. She reports that she does not use drugs.   Family History:  The patient's family history includes Atrial fibrillation in her daughter; Cancer in her mother; Heart attack in her father; Heart disease in her father; Hypertension in  her daughter, father, and son.    ROS: All other systems are reviewed and negative. Unless otherwise mentioned in H&P    PHYSICAL EXAM: VS:  There were no vitals taken for this visit. , BMI There is no height or weight on file to calculate BMI. GEN: Well nourished, well developed, in no acute distress HEENT: normal Neck: no JVD, carotid bruits, or masses Cardiac: ***RRR; no murmurs, rubs, or gallops,no edema  Respiratory:  Clear to auscultation bilaterally, normal work of breathing GI: soft, nontender, nondistended, + BS MS: no deformity or atrophy Skin: warm and dry, no rash Neuro:  Strength and sensation are intact Psych: euthymic mood, full affect   EKG:  EKG {ACTION; IS/IS XJO:[id number]} ordered today. The ekg ordered today demonstrates ***   Recent Labs: 03/10/2018: TSH  0.883 03/13/2018: Magnesium 1.8 03/23/2018: ALT 22; BUN 28; Creatinine, Ser 1.57; Hemoglobin 11.9; Platelets 526; Potassium 4.1; Sodium 140    Lipid Panel    Component Value Date/Time   CHOL 148 03/03/2018 1213   CHOL 163 05/31/2017 0858   TRIG 109 03/03/2018 1213   HDL 61 03/03/2018 1213   HDL 75 05/31/2017 0858   CHOLHDL 2.4 03/03/2018 1213   VLDL 22 03/03/2018 1213   LDLCALC 65 03/03/2018 1213   LDLCALC 67 05/31/2017 0858      Wt Readings from Last 3 Encounters:  03/02/18 153 lb 7 oz (69.6 kg)  02/24/18 153 lb 6.4 oz (69.6 kg)  01/27/18 152 lb (68.9 kg)      Other studies Reviewed: Additional studies/ records that were reviewed today include: ***. Review of the above records demonstrates: ***   ASSESSMENT AND PLAN:  1.  ***   Current medicines are reviewed at length with the patient today.    Labs/ tests ordered today include: *** Phill Myron. [place] Pugh, ANP, AACC   03/25/2018 9:18 AM    [place] [address] Office [phone] Fax [phone]

## 2018-03-26 ENCOUNTER — Ambulatory Visit: Payer: Medicare Other | Admitting: Adult Health

## 2018-03-27 ENCOUNTER — Encounter: Payer: Self-pay | Admitting: Internal Medicine

## 2018-03-27 ENCOUNTER — Non-Acute Institutional Stay (SKILLED_NURSING_FACILITY): Payer: Medicare Other | Admitting: Internal Medicine

## 2018-03-27 DIAGNOSIS — N183 Chronic kidney disease, stage 3 unspecified: Secondary | ICD-10-CM

## 2018-03-27 DIAGNOSIS — R05 Cough: Secondary | ICD-10-CM | POA: Diagnosis not present

## 2018-03-27 DIAGNOSIS — I480 Paroxysmal atrial fibrillation: Secondary | ICD-10-CM

## 2018-03-27 DIAGNOSIS — R059 Cough, unspecified: Secondary | ICD-10-CM

## 2018-03-27 NOTE — Progress Notes (Signed)
Location:    [facility] Room Number: 129/P Place of Service:  SNF [phone]) Provider:  Joya Gaskins, MD  Patient Care Team: [name], MD as PCP - General (Family Medicine) Harl Bowie Alphonse Guild, MD as PCP - Cardiology (Cardiology)  Extended Emergency Contact Information Primary Emergency Contact: Hall,David Address: [address]          [place], [place] [address] Johnnette Litter of [place] Phone: [phone] Work Phone: [phone] Mobile Phone: [phone] Relation: Son Secondary Emergency Contact: Lovett Calender Home Phone: [phone] Relation: Other  Code Status:  Full Code Goals of care: Advanced Directive information Advanced Directives 03/27/2018  Does Patient Have a Medical Advance Directive? Yes  Type of Advance Directive (No Data)  Does patient want to make changes to medical advance directive? No - Patient declined  Would patient like information on creating a medical advance directive? No - Patient declined  Pre-existing out of facility DNR order (yellow form or pink MOST form) -    Chief complaint-acute visit secondary to cough.     HPI:  Pt is a 84 y.o. female seen today for an acute visit for apparent complaints of cough.  Patient apparently has been coughing more according to family   Patient is not complaining of any increased congestion or shortness of breath nursing staff states the cough appears to be intermittent.  Vital signs appear to be stable.  She is on Flonase once a day  Patient had recent hospitalization for a right MCA stroke as well as atrial fibrillation-she also has a history of coronary artery disease in addition to hypertension chronic kidney disease atrial fibrillation hyperlipidemia and osteoporosis.  Apparently she is has an intermittent cough and family has asked that I take a look at her.  Patient herself is not complaining of any increased congestion or shortness of breath  apparently the cough is intermittent.   She is afebrile does not complain of fever  or chills        Past Medical History:  Diagnosis Date  . Anemia   . CAD in native artery    a. NSTEMI 12/2005 s/p BMS to Cx, prior normal LVEF.  Marland Kitchen Cancer ([place]) 1988 and  2009   BCC nose and  Melanoma back  . Cancer of skin Jan. 2014   [facility] Forehead and Nose  . Carotid stenosis    mild  . Chronic back pain   . CKD (chronic kidney disease), stage III ([place])   . Coronary atherosclerosis of unspecified type of vessel, native or graft   . DJD (degenerative joint disease) of knee   . History of TIA (transient ischemic attack)   . HTN (hypertension)   . IBS (irritable bowel syndrome)   . Myocardial infarction [facility])    5 yrs ago     dr wall  cardiac  . Osteoporosis   . Other diseases of lung, not elsewhere classified    lung nodule  . Polymyalgia rheumatica ([place])   . Shingles 01/2016-02/2016  . Stroke [facility])    Past Surgical History:  Procedure Laterality Date  . ABDOMINAL HYSTERECTOMY  1977   partial  . BACK SURGERY  1982  . CARDIAC CATHETERIZATION    . COLONOSCOPY N/A 01/27/2013   Procedure: COLONOSCOPY;  Surgeon: Rogene Houston, MD;  Location: [facility];  Service: Endoscopy;  Laterality: N/A;  1030  . EYE SURGERY Bilateral 04/2017  . EYE SURGERY Right 09/23/2017  . IR KYPHO LUMBAR  INC FX REDUCE BONE BX UNI/BIL CANNULATION INC/IMAGING  09/30/2017  . IR VERTEBROPLASTY CERV/THOR BX INC UNI/BIL INC/INJECT/IMAGING  09/05/2017  . JOINT REPLACEMENT  2001 and  2011   6 knee surgeries   bil replacement  . KNEE ARTHROPLASTY    . KNEE ARTHROSCOPY    . LUMBAR LAMINECTOMY    . PR VEIN BYPASS GRAFT,AORTO-FEM-POP  2010  . removal of melanoma from back  2007  . SPINE SURGERY  2013   Broken back: fusion surgery  . tonisllectomy  1944  . total knee anthroplasty    . VERTEBROPLASTY  07/11/2011   Procedure: VERTEBROPLASTY;  Surgeon: Faythe Ghee, MD;  Location: [facility] NEURO ORS;  Service:  Neurosurgery;  Laterality: N/A;  Vertebroplasty of Lumbar Two    Allergies  Allergen Reactions  . Epinephrine Other (See Comments)    During dental procedure-caused facial swelling at injection site. Pt has had epinephrine since then for nose and facial surgery and did fine with it per pt.  . Clopidogrel Bisulfate Itching and Rash    Plavix  . Oxycodone     Hallucinations. Pt states that she can tolerate hydrocodone    Outpatient Encounter Medications as of 03/27/2018  Medication Sig  . Abaloparatide (TYMLOS) 3120 MCG/1.56ML SOPN Inject 80 mcg into the skin daily.  Marland Kitchen acetaminophen (TYLENOL) 500 MG tablet Take 500 mg by mouth every 6 (six) hours as needed for mild pain.  Marland Kitchen amiodarone (PACERONE) 200 MG tablet Take 200 mg by mouth daily.  Marland Kitchen amLODipine (NORVASC) 5 MG tablet Take 5 mg by mouth daily.  Marland Kitchen apixaban (ELIQUIS) 5 MG TABS tablet Take 1 tablet (5 mg total) by mouth 2 (two) times daily.  Marland Kitchen atorvastatin (LIPITOR) 80 MG tablet Take 1 tablet (80 mg total) by mouth at bedtime.  . Calcium Carbonate-Vit D-Min (CALCIUM 1200 PO) Take 1 tablet by mouth at bedtime.   Marland Kitchen estrogens, conjugated, (PREMARIN) 0.3 MG tablet Take 1 tablet (0.3 mg total) by mouth every evening.  . fluticasone (FLONASE) 50 MCG/ACT nasal spray Place 2 sprays into both nostrils daily.  Marland Kitchen gabapentin (NEURONTIN) 100 MG capsule Take 100 mg by mouth 3 (three) times daily.  . hydrocortisone cream 1 % Apply sparingly to rectal once a day prn  . loperamide (IMODIUM) 2 MG capsule Take 2 mg by mouth daily as needed for diarrhea or loose stools.   . Maltodextrin-Xanthan Gum (RESOURCE THICKENUP CLEAR) POWD Take 120 g by mouth as needed (nectar thick liquids).  . metoprolol tartrate (LOPRESSOR) 100 MG tablet Take 1 tablet (100 mg total) by mouth 2 (two) times daily.  . nitroGLYCERIN (NITROSTAT) 0.4 MG SL tablet Place 1 tablet (0.4 mg total) under the tongue every 5 (five) minutes as needed for chest pain.  . pantoprazole (PROTONIX) 40  MG tablet Take 40 mg by mouth daily.  . sertraline (ZOLOFT) 50 MG tablet Take 50 mg by mouth daily.  . traMADol (ULTRAM) 50 MG tablet Take 1 tablet (50 mg total) by mouth every 4 (four) hours as needed.  . Zinc 50 MG CAPS Take 50 mg by mouth daily.   No facility-administered encounter medications on file as of 03/27/2018.     Review of Systems   General she is not complaining of fever or chills.  Skin does not complain of diaphoresis or itching.  Head ears eyes nose mouth and throat is not really complaining of sore throat or visual changes.  Respiratory denies shortness of breath apparently has an intermittent cough.  Cardiac is not complaining of chest pain.  GI is not complaining of abdominal pain nausea or vomiting.  GU does not complain of dysuria.  Musculoskeletal continues to complain of weakness some arm and back pain.  Neurologic does have weakness is status post CVA with left-sided hemiparesis.  Psych appears to be feeling a bit better  Immunization History  Administered Date(s) Administered  . Influenza,inj,Quad PF,6+ Mos 03/07/2015, 06/04/2016, 01/14/2017, 01/27/2018  . Influenza-Unspecified 02/20/2012, 01/12/2013, 01/28/2014  . Pneumococcal Conjugate-13 03/07/2003  . Tdap 03/08/2013  . Zoster 01/04/2009   Pertinent  Health Maintenance Due  Topic Date Due  . PNA vac Low Risk Adult (2 of 2 - PPSV23) 04/08/2018 (Originally 03/06/2004)  . INFLUENZA VACCINE  Completed  . DEXA SCAN  Completed   Fall Risk  03/27/2017 12/26/2015  Falls in the past year? No No  Comment Emmi Telephone Survey: data to providers prior to load Emmi Telephone Survey: data to providers prior to load   Functional Status Survey:    PHYSICAL EXAM   Temperature is 97.3 pulse is 60 respirations 18 blood pressure manually 152/50 earlier by machine 136/60.  In general this is a fairly well-nourished elderly female in no distress.  Her skin is warm and dry.  Eyes visual acuity appears to  be intact sclera and conjunctive are clear.  Oropharynx mucous membranes appear fairly moist.  Appears to have some slight film on the tongue.  Chest is clear to auscultation with shallow air entry there is no labored breathing.  Heart is largely regular rate and rhythm with an occasional irregular beat-has I would say trace edema of her legs bilaterally continues to have edema of her left upper extremity.  Abdomen is soft nontender with positive bowel sounds.  Musculoskeletal continues with dense left-sided hemiparesis.  Neurologic she is alert but does have left-sided hemiparesis does move her right upper and lower extremities.  And psych she is largely alert and oriented her mood seems to be gradually improving   Labs reviewed: Recent Labs    03/08/18 1019 03/10/18 1033  03/13/18 0318 03/14/18 0415 03/23/18 0700  NA 137 138   < > 137 139 140  K 4.0 4.0   < > 4.1 4.1 4.1  CL 111 110   < > 108 108 103  CO2 19* 21*   < > 22 24 28   GLUCOSE 106* 111*   < > 98 97 127*  BUN 17 18   < > 20 17 28*  CREATININE 0.86 1.18*   < > 1.22* 0.98 1.57*  CALCIUM 8.5* 8.8*   < > 8.5* 8.7* 9.2  MG 1.7 2.0  --  1.8  --   --    < > = values in this interval not displayed.   Recent Labs    10/26/17 0647 11/03/17 1228 03/02/18 1729 03/23/18 0700  AST 16 21 20  44*  ALT 10 13 12 22   ALKPHOS 68  --  83 66  BILITOT 1.1 0.3 0.9 0.7  PROT 6.4* 6.7  6.6 7.6 7.0  ALBUMIN 2.8*  --  3.6 2.8*   Recent Labs    03/13/18 0318 03/14/18 0415 03/23/18 0700  WBC 8.6 8.8 5.2  NEUTROABS 4.7 5.2 2.8  HGB 10.6* 10.8* 11.9*  HCT 35.0* 36.5 39.7  MCV 88.6 89.5 90.2  PLT 464* 465* 526*   Lab Results  Component Value Date   TSH 0.883 03/10/2018   Lab Results  Component Value Date   HGBA1C 5.5 03/03/2018  Lab Results  Component Value Date   CHOL 148 03/03/2018   HDL 61 03/03/2018   LDLCALC 65 03/03/2018   TRIG 109 03/03/2018   CHOLHDL 2.4 03/03/2018    Significant Diagnostic Results  in last 30 days:  Ct Angio Head W Or Wo Contrast  Result Date: 03/02/2018 CLINICAL DATA:  Left-sided weakness EXAM: CT ANGIOGRAPHY HEAD AND NECK CT PERFUSION BRAIN TECHNIQUE: Multidetector CT imaging of the head and neck was performed using the standard protocol during bolus administration of intravenous contrast. Multiplanar CT image reconstructions and MIPs were obtained to evaluate the vascular anatomy. Carotid stenosis measurements (when applicable) are obtained utilizing NASCET criteria, using the distal internal carotid diameter as the denominator. Multiphase CT imaging of the brain was performed following IV bolus contrast injection. Subsequent parametric perfusion maps were calculated using RAPID software. CONTRAST:  40mL ISOVUE-370 IOPAMIDOL (ISOVUE-370) INJECTION 76% COMPARISON:  None. FINDINGS: CTA NECK FINDINGS SKELETON: There is no bony spinal canal stenosis. No lytic or blastic lesion. OTHER NECK: Normal pharynx, larynx and major salivary glands. No cervical lymphadenopathy. Unremarkable thyroid gland. UPPER CHEST: No pneumothorax or pleural effusion. No nodules or masses. AORTIC ARCH: There is mild calcific atherosclerosis of the aortic arch. There is no aneurysm, dissection or hemodynamically significant stenosis of the visualized ascending aorta and aortic arch. Conventional 3 vessel aortic branching pattern. The visualized proximal subclavian arteries are widely patent. RIGHT CAROTID SYSTEM: --Common carotid artery: Widely patent origin without common carotid artery dissection or aneurysm. --Internal carotid artery: No dissection, occlusion or aneurysm. There is mixed density atherosclerosis extending into the proximal ICA, resulting in less than 50% stenosis. --External carotid artery: No acute abnormality. LEFT CAROTID SYSTEM: --Common carotid artery: Widely patent origin without common carotid artery dissection or aneurysm. --Internal carotid artery: No dissection, occlusion or aneurysm.  There is mixed density atherosclerosis extending into the proximal ICA, resulting in less than 50% stenosis. --External carotid artery: No acute abnormality. VERTEBRAL ARTERIES: Left dominant configuration. There is moderate narrowing of both vertebral artery origins. No dissection, occlusion or flow-limiting stenosis to the vertebrobasilar confluence. CTA HEAD FINDINGS ANTERIOR CIRCULATION: --Intracranial internal carotid arteries: Atherosclerotic calcification of the internal carotid arteries at the skull base without hemodynamically significant stenosis. --Anterior cerebral arteries: Normal. Both A1 segments are present. Patent anterior communicating artery. --Middle cerebral arteries: There is a short segment occlusion of the proximal right M2 segment. The length of the occluded segment is approximately 5 mm. --Posterior communicating arteries: Present bilaterally. POSTERIOR CIRCULATION: --Basilar artery: Normal. --Posterior cerebral arteries: Normal. --Superior cerebellar arteries: Normal. --Inferior cerebellar arteries: Normal anterior and posterior inferior cerebellar arteries. VENOUS SINUSES: As permitted by contrast timing, patent. ANATOMIC VARIANTS: None DELAYED PHASE: No parenchymal contrast enhancement. Review of the MIP images confirms the above findings. ASPECTS ([place] Stroke Program Early CT Score) - Ganglionic level infarction (caudate, lentiform nuclei, internal capsule, insula, M1-M3 cortex): 5 - Supraganglionic infarction (M4-M6 cortex): 1 Total score (0-10 with 10 being normal): 6 CT Brain Perfusion Findings: CBF (<30%) Volume: 6mL Perfusion (Tmax>6.0s) volume: 44mL Mismatch Volume: 70mL Infarction Location:Posterior right MCA territory IMPRESSION: 1. Short segment occlusion of the right middle cerebral artery proximal M2 segment. 2. 21 mL region of ischemia in the posterior right MCA territory by CT perfusion criteria. The CT perfusion data does not demonstrate a core infarct, but the ASPECT  Score is 6 with findings of early infarction in the posterior right MCA territory. 3. Bilateral carotid bifurcation atherosclerosis with less than 50% stenosis. 4.  Aortic atherosclerosis (ICD10-I70.0). These  results were called by telephone at the time of interpretation on 03/02/2018 at 6:07 pm to Dr. Daleen Bo , who verbally acknowledged these results. Electronically Signed   By: Ulyses Jarred M.D.   On: 03/02/2018 18:25   Ct Angio Neck W And/or Wo Contrast  Result Date: 03/02/2018 CLINICAL DATA:  Left-sided weakness EXAM: CT ANGIOGRAPHY HEAD AND NECK CT PERFUSION BRAIN TECHNIQUE: Multidetector CT imaging of the head and neck was performed using the standard protocol during bolus administration of intravenous contrast. Multiplanar CT image reconstructions and MIPs were obtained to evaluate the vascular anatomy. Carotid stenosis measurements (when applicable) are obtained utilizing NASCET criteria, using the distal internal carotid diameter as the denominator. Multiphase CT imaging of the brain was performed following IV bolus contrast injection. Subsequent parametric perfusion maps were calculated using RAPID software. CONTRAST:  37mL ISOVUE-370 IOPAMIDOL (ISOVUE-370) INJECTION 76% COMPARISON:  None. FINDINGS: CTA NECK FINDINGS SKELETON: There is no bony spinal canal stenosis. No lytic or blastic lesion. OTHER NECK: Normal pharynx, larynx and major salivary glands. No cervical lymphadenopathy. Unremarkable thyroid gland. UPPER CHEST: No pneumothorax or pleural effusion. No nodules or masses. AORTIC ARCH: There is mild calcific atherosclerosis of the aortic arch. There is no aneurysm, dissection or hemodynamically significant stenosis of the visualized ascending aorta and aortic arch. Conventional 3 vessel aortic branching pattern. The visualized proximal subclavian arteries are widely patent. RIGHT CAROTID SYSTEM: --Common carotid artery: Widely patent origin without common carotid artery dissection or  aneurysm. --Internal carotid artery: No dissection, occlusion or aneurysm. There is mixed density atherosclerosis extending into the proximal ICA, resulting in less than 50% stenosis. --External carotid artery: No acute abnormality. LEFT CAROTID SYSTEM: --Common carotid artery: Widely patent origin without common carotid artery dissection or aneurysm. --Internal carotid artery: No dissection, occlusion or aneurysm. There is mixed density atherosclerosis extending into the proximal ICA, resulting in less than 50% stenosis. --External carotid artery: No acute abnormality. VERTEBRAL ARTERIES: Left dominant configuration. There is moderate narrowing of both vertebral artery origins. No dissection, occlusion or flow-limiting stenosis to the vertebrobasilar confluence. CTA HEAD FINDINGS ANTERIOR CIRCULATION: --Intracranial internal carotid arteries: Atherosclerotic calcification of the internal carotid arteries at the skull base without hemodynamically significant stenosis. --Anterior cerebral arteries: Normal. Both A1 segments are present. Patent anterior communicating artery. --Middle cerebral arteries: There is a short segment occlusion of the proximal right M2 segment. The length of the occluded segment is approximately 5 mm. --Posterior communicating arteries: Present bilaterally. POSTERIOR CIRCULATION: --Basilar artery: Normal. --Posterior cerebral arteries: Normal. --Superior cerebellar arteries: Normal. --Inferior cerebellar arteries: Normal anterior and posterior inferior cerebellar arteries. VENOUS SINUSES: As permitted by contrast timing, patent. ANATOMIC VARIANTS: None DELAYED PHASE: No parenchymal contrast enhancement. Review of the MIP images confirms the above findings. ASPECTS ([employer] Stroke Program Early CT Score) - Ganglionic level infarction (caudate, lentiform nuclei, internal capsule, insula, M1-M3 cortex): 5 - Supraganglionic infarction (M4-M6 cortex): 1 Total score (0-10 with 10 being normal): 6 CT  Brain Perfusion Findings: CBF (<30%) Volume: 79mL Perfusion (Tmax>6.0s) volume: 2mL Mismatch Volume: 48mL Infarction Location:Posterior right MCA territory IMPRESSION: 1. Short segment occlusion of the right middle cerebral artery proximal M2 segment. 2. 21 mL region of ischemia in the posterior right MCA territory by CT perfusion criteria. The CT perfusion data does not demonstrate a core infarct, but the ASPECT Score is 6 with findings of early infarction in the posterior right MCA territory. 3. Bilateral carotid bifurcation atherosclerosis with less than 50% stenosis. 4.  Aortic atherosclerosis (ICD10-I70.0). These results were  called by telephone at the time of interpretation on 03/02/2018 at 6:07 pm to Dr. Daleen Bo , who verbally acknowledged these results. Electronically Signed   By: Ulyses Jarred M.D.   On: 03/02/2018 18:25   Mr Brain Wo Contrast  Result Date: 03/03/2018 CLINICAL DATA:  Stroke follow-up EXAM: MRI HEAD WITHOUT CONTRAST TECHNIQUE: Multiplanar, multiecho pulse sequences of the brain and surrounding structures were obtained without intravenous contrast. COMPARISON:  None. FINDINGS: BRAIN: Large area of acute ischemia within the right MCA territory, involving much of the frontal and parietal cortex and the right insula. No other diffusion abnormality. The midline structures are normal. No midline shift or other mass effect. There are old bilateral cerebellar infarcts and old infarcts of the right basal ganglia and thalamus. Early confluent hyperintense T2-weighted signal of the periventricular and deep white matter, most commonly due to chronic ischemic microangiopathy. There is cytotoxic edema throughout the distribution of the ischemia. The cerebral and cerebellar volume are age-appropriate. 2-3 scattered foci of chronic microhemorrhage. VASCULAR: Major intracranial arterial and venous sinus flow voids are normal. SKULL AND UPPER CERVICAL SPINE: Calvarial bone marrow signal is normal.  There is no skull base mass. Visualized upper cervical spine and soft tissues are normal. SINUSES/ORBITS: No fluid levels or advanced mucosal thickening. No mastoid or middle ear effusion. The orbits are normal. IMPRESSION: 1. Large right MCA territory infarct without hemorrhage or mass effect. 2. Findings of chronic ischemic microangiopathy with multiple small vessel infarcts. Electronically Signed   By: Ulyses Jarred M.D.   On: 03/03/2018 22:23   Ct Cerebral Perfusion W Contrast  Result Date: 03/02/2018 CLINICAL DATA:  Left-sided weakness EXAM: CT ANGIOGRAPHY HEAD AND NECK CT PERFUSION BRAIN TECHNIQUE: Multidetector CT imaging of the head and neck was performed using the standard protocol during bolus administration of intravenous contrast. Multiplanar CT image reconstructions and MIPs were obtained to evaluate the vascular anatomy. Carotid stenosis measurements (when applicable) are obtained utilizing NASCET criteria, using the distal internal carotid diameter as the denominator. Multiphase CT imaging of the brain was performed following IV bolus contrast injection. Subsequent parametric perfusion maps were calculated using RAPID software. CONTRAST:  44mL ISOVUE-370 IOPAMIDOL (ISOVUE-370) INJECTION 76% COMPARISON:  None. FINDINGS: CTA NECK FINDINGS SKELETON: There is no bony spinal canal stenosis. No lytic or blastic lesion. OTHER NECK: Normal pharynx, larynx and major salivary glands. No cervical lymphadenopathy. Unremarkable thyroid gland. UPPER CHEST: No pneumothorax or pleural effusion. No nodules or masses. AORTIC ARCH: There is mild calcific atherosclerosis of the aortic arch. There is no aneurysm, dissection or hemodynamically significant stenosis of the visualized ascending aorta and aortic arch. Conventional 3 vessel aortic branching pattern. The visualized proximal subclavian arteries are widely patent. RIGHT CAROTID SYSTEM: --Common carotid artery: Widely patent origin without common carotid  artery dissection or aneurysm. --Internal carotid artery: No dissection, occlusion or aneurysm. There is mixed density atherosclerosis extending into the proximal ICA, resulting in less than 50% stenosis. --External carotid artery: No acute abnormality. LEFT CAROTID SYSTEM: --Common carotid artery: Widely patent origin without common carotid artery dissection or aneurysm. --Internal carotid artery: No dissection, occlusion or aneurysm. There is mixed density atherosclerosis extending into the proximal ICA, resulting in less than 50% stenosis. --External carotid artery: No acute abnormality. VERTEBRAL ARTERIES: Left dominant configuration. There is moderate narrowing of both vertebral artery origins. No dissection, occlusion or flow-limiting stenosis to the vertebrobasilar confluence. CTA HEAD FINDINGS ANTERIOR CIRCULATION: --Intracranial internal carotid arteries: Atherosclerotic calcification of the internal carotid arteries at the skull base  without hemodynamically significant stenosis. --Anterior cerebral arteries: Normal. Both A1 segments are present. Patent anterior communicating artery. --Middle cerebral arteries: There is a short segment occlusion of the proximal right M2 segment. The length of the occluded segment is approximately 5 mm. --Posterior communicating arteries: Present bilaterally. POSTERIOR CIRCULATION: --Basilar artery: Normal. --Posterior cerebral arteries: Normal. --Superior cerebellar arteries: Normal. --Inferior cerebellar arteries: Normal anterior and posterior inferior cerebellar arteries. VENOUS SINUSES: As permitted by contrast timing, patent. ANATOMIC VARIANTS: None DELAYED PHASE: No parenchymal contrast enhancement. Review of the MIP images confirms the above findings. ASPECTS ([place] Stroke Program Early CT Score) - Ganglionic level infarction (caudate, lentiform nuclei, internal capsule, insula, M1-M3 cortex): 5 - Supraganglionic infarction (M4-M6 cortex): 1 Total score (0-10 with  10 being normal): 6 CT Brain Perfusion Findings: CBF (<30%) Volume: 12mL Perfusion (Tmax>6.0s) volume: 36mL Mismatch Volume: 50mL Infarction Location:Posterior right MCA territory IMPRESSION: 1. Short segment occlusion of the right middle cerebral artery proximal M2 segment. 2. 21 mL region of ischemia in the posterior right MCA territory by CT perfusion criteria. The CT perfusion data does not demonstrate a core infarct, but the ASPECT Score is 6 with findings of early infarction in the posterior right MCA territory. 3. Bilateral carotid bifurcation atherosclerosis with less than 50% stenosis. 4.  Aortic atherosclerosis (ICD10-I70.0). These results were called by telephone at the time of interpretation on 03/02/2018 at 6:07 pm to Dr. Daleen Bo , who verbally acknowledged these results. Electronically Signed   By: Ulyses Jarred M.D.   On: 03/02/2018 18:25   Dg Chest Port 1 View  Result Date: 03/08/2018 CLINICAL DATA:  Developing cough EXAM: PORTABLE CHEST 1 VIEW COMPARISON:  10/24/17 FINDINGS: Cardiac shadow is within normal limits. Aortic calcifications are again seen. The lungs are well aerated bilaterally. Minimal left basilar atelectasis is seen. No significant pulmonary edema is noted. No bony abnormality is seen. IMPRESSION: Mild left basilar atelectasis. Electronically Signed   By: Inez Catalina M.D.   On: 03/08/2018 09:37   Dg Swallowing Func-speech Pathology  Result Date: 03/05/2018 Objective Swallowing Evaluation: Type of Study: MBS-Modified Barium Swallow Study  Patient Details Name: Brenda Hall MRN: [id number] Date of Birth: [birth year] Today's Date: 03/05/2018 Time: SLP Start Time (ACUTE ONLY): 0945 -SLP Stop Time (ACUTE ONLY): 1010 SLP Time Calculation (min) (ACUTE ONLY): 25 min Past Medical History: Past Medical History: Diagnosis Date . Anemia  . CAD in native artery   a. NSTEMI 12/2005 s/p BMS to Cx, prior normal LVEF. Marland Kitchen Cancer ([place]) 1988 and  2009  BCC nose and  Melanoma back . Cancer  of skin Jan. 2014  [facility] Forehead and Nose . Carotid stenosis   mild . Chronic back pain  . CKD (chronic kidney disease), stage III ([place])  . Coronary atherosclerosis of unspecified type of vessel, native or graft  . DJD (degenerative joint disease) of knee  . History of TIA (transient ischemic attack)  . HTN (hypertension)  . IBS (irritable bowel syndrome)  . Myocardial infarction [facility])   5 yrs ago     dr wall  cardiac . Osteoporosis  . Other diseases of lung, not elsewhere classified   lung nodule . Polymyalgia rheumatica ([place])  . Shingles 01/2016-02/2016 . Stroke [facility])  Past Surgical History: Past Surgical History: Procedure Laterality Date . ABDOMINAL HYSTERECTOMY  1977  partial . BACK SURGERY  1982 . CARDIAC CATHETERIZATION   . COLONOSCOPY N/A 01/27/2013  Procedure: COLONOSCOPY;  Surgeon: Rogene Houston, MD;  Location[facility];  Service:  Endoscopy;  Laterality: N/A;  1030 . EYE SURGERY Bilateral 04/2017 . EYE SURGERY Right 09/23/2017 . IR KYPHO LUMBAR INC FX REDUCE BONE BX UNI/BIL CANNULATION INC/IMAGING  09/30/2017 . IR VERTEBROPLASTY CERV/THOR BX INC UNI/BIL INC/INJECT/IMAGING  09/05/2017 . JOINT REPLACEMENT  2001 and  2011  6 knee surgeries   bil replacement . KNEE ARTHROPLASTY   . KNEE ARTHROSCOPY   . LUMBAR LAMINECTOMY   . PR VEIN BYPASS GRAFT,AORTO-FEM-POP  2010 . removal of melanoma from back  2007 . SPINE SURGERY  2013  Broken back: fusion surgery . tonisllectomy  1944 . total knee anthroplasty   . VERTEBROPLASTY  07/11/2011  Procedure: VERTEBROPLASTY;  Surgeon: Faythe Ghee, MD;  Location: [place] NEURO ORS;  Service: Neurosurgery;  Laterality: N/A;  Vertebroplasty of Lumbar Two HPI: 84 yo female adm to [facility] with left sided weakness (dense hemiplegia), dysarthria and AMS.  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 PMH + for shingles, PMR, TIA, CAD, recent viral infection- s/p Zpac use per pt.  Swallow and speech evaluation ordered.  Pt did not pass the [place] swallow screen.   Subjective: Im going to disown my  children, they wont give me any water Assessment / Plan / Recommendation CHL IP CLINICAL IMPRESSIONS 03/05/2018 Clinical Impression  Pt demonstrates a moderate oral dysphagia complicated by pt impulsivity, poor awarness and distracitbility. When total assist was given pt was able to seal right side of lips on spoon or straw. Anterior spill more significant with cup; pt often began talking with her mouth full of liquid as well. It verbal cues and extra time pt was able to lingually propel 90% of bolus to pyriforms with moderate left oral residuals. Increased cueing with puree needed. There was a significant delay in swallow initiation with pooled liquid in the pyriforms for several seconds. With thin, this resulted in sensed aspiration before the swallow. Only very trace sensed penetration of nectar thick liquids observed after the swallow of mild residue mixing with secretions. Depsite deficits, pt tolerated nectar and puree quite well during study and appears to be able to sense, cough and eject trace penetrates that are likely to occur intermittently. Also of concern was signs of esophageal dysmotility on esophageal sweep (no radiologist present to confirm); esophageal precautions warranted. Will initiate Dys 1/nectar thick diet and monitor for tolerance and educate family and staff about precautions.  SLP Visit Diagnosis Dysphagia, oropharyngeal phase (R13.12) Attention and concentration deficit following -- Frontal lobe and executive function deficit following -- Impact on safety and function Moderate aspiration risk   CHL IP TREATMENT RECOMMENDATION 03/05/2018 Treatment Recommendations Therapy as outlined in treatment plan below   Prognosis 03/03/2018 Prognosis for Safe Diet Advancement Guarded Barriers to Reach Goals Severity of deficits;Behavior Barriers/Prognosis Comment -- CHL IP DIET RECOMMENDATION 03/05/2018 SLP Diet Recommendations Dysphagia 1 (Puree) solids;Nectar thick liquid Liquid Administration via  [employer] Medication Administration Crushed with puree Compensations Slow rate;Small sips/bites;Minimize environmental distractions;Monitor for anterior loss;Lingual sweep for clearance of pocketing;Clear throat intermittently Postural Changes Seated upright at 90 degrees;Remain semi-upright after after feeds/meals (Comment)   CHL IP OTHER RECOMMENDATIONS 03/05/2018 Recommended Consults -- Oral Care Recommendations Oral care before and after PO Other Recommendations Have oral suction available   CHL IP FOLLOW UP RECOMMENDATIONS 03/03/2018 Follow up Recommendations (No Data)   CHL IP FREQUENCY AND DURATION 03/05/2018 Speech Therapy Frequency (ACUTE ONLY) min 2x/week Treatment Duration 2 weeks      CHL IP ORAL PHASE 03/05/2018 Oral Phase Impaired Oral - Pudding Teaspoon -- Oral -  Pudding Cup -- Oral - Honey Teaspoon -- Oral - Honey Cup -- Oral - Nectar Teaspoon Left anterior bolus loss;Weak lingual manipulation;Reduced posterior propulsion;Left pocketing in lateral sulci;Pocketing in anterior sulcus;Delayed oral transit;Decreased bolus cohesion;Premature spillage Oral - Nectar Cup -- Oral - Nectar Straw Left anterior bolus loss;Weak lingual manipulation;Reduced posterior propulsion;Left pocketing in lateral sulci;Pocketing in anterior sulcus;Delayed oral transit;Decreased bolus cohesion;Premature spillage Oral - Thin Teaspoon -- Oral - Thin Cup -- Oral - Thin Straw Left anterior bolus loss;Weak lingual manipulation;Reduced posterior propulsion;Left pocketing in lateral sulci;Pocketing in anterior sulcus;Delayed oral transit;Decreased bolus cohesion;Premature spillage Oral - Puree Left anterior bolus loss;Weak lingual manipulation;Reduced posterior propulsion;Left pocketing in lateral sulci;Pocketing in anterior sulcus;Delayed oral transit;Decreased bolus cohesion;Premature spillage Oral - Mech Soft -- Oral - Regular -- Oral - Multi-Consistency -- Oral - Pill -- Oral Phase - Comment --  CHL IP PHARYNGEAL PHASE  03/05/2018 Pharyngeal Phase Impaired Pharyngeal- Pudding Teaspoon -- Pharyngeal -- Pharyngeal- Pudding Cup -- Pharyngeal -- Pharyngeal- Honey Teaspoon -- Pharyngeal -- Pharyngeal- Honey Cup -- Pharyngeal -- Pharyngeal- Nectar Teaspoon Delayed swallow initiation-pyriform sinuses Pharyngeal -- Pharyngeal- Nectar Cup -- Pharyngeal -- Pharyngeal- Nectar Straw Delayed swallow initiation-pyriform sinuses;Penetration/Apiration after swallow;Pharyngeal residue - valleculae;Pharyngeal residue - pyriform Pharyngeal Material enters airway, CONTACTS cords and then ejected out;Material does not enter airway Pharyngeal- Thin Teaspoon -- Pharyngeal -- Pharyngeal- Thin Cup -- Pharyngeal -- Pharyngeal- Thin Straw Delayed swallow initiation-pyriform sinuses;Pharyngeal residue - valleculae;Pharyngeal residue - pyriform;Penetration/Aspiration before swallow;Trace aspiration Pharyngeal Material enters airway, passes BELOW cords then ejected out Pharyngeal- Puree Delayed swallow initiation-vallecula Pharyngeal -- Pharyngeal- Mechanical Soft -- Pharyngeal -- Pharyngeal- Regular -- Pharyngeal -- Pharyngeal- Multi-consistency -- Pharyngeal -- Pharyngeal- Pill -- Pharyngeal -- Pharyngeal Comment --  No flowsheet data found. Herbie Baltimore, MA CCC-SLP Acute Rehabilitation Services Pager [phone] Office [phone] Lynann Beaver 03/05/2018, 10:35 AM              Ct Head Code Stroke Wo Contrast  Result Date: 03/02/2018 CLINICAL DATA:  Code stroke.  Left-sided weakness EXAM: CT HEAD WITHOUT CONTRAST TECHNIQUE: Contiguous axial images were obtained from the base of the skull through the vertex without intravenous contrast. COMPARISON:  None. FINDINGS: Brain: There is no mass, hemorrhage or extra-axial collection. The size and configuration of the ventricles and extra-axial CSF spaces are normal. There is hypoattenuation within the posterior right MCA territory suggesting acute or early subacute infarct. No midline shift or  other mass effect. 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 Vascular: Mild hyperattenuation within the right MCA M2 segment. Bilateral distal ICA atherosclerotic calcification. Skull: The visualized skull base, calvarium and extracranial soft tissues are normal. Sinuses/Orbits: No fluid levels or advanced mucosal thickening of the visualized paranasal sinuses. No mastoid or middle ear effusion. The orbits are normal. ASPECTS [facility] Stroke Program Early CT Score) - Ganglionic level infarction (caudate, lentiform nuclei, internal capsule, insula, M1-M3 cortex): 5 - Supraganglionic infarction (M4-M6 cortex): 1 Total score (0-10 with 10 being normal): 6 IMPRESSION: 1. No acute hemorrhage. 2. Hypoattenuation in the posterior right MCA territory consistent with acute or early subacute infarct. 3. ASPECTS is 6. These results were called by telephone at the time of interpretation on 03/02/2018 at 5:47 pm to Dr. Daleen Bo , who verbally acknowledged these results. Electronically Signed   By: Ulyses Jarred M.D.   On: 03/02/2018 17:53   Vas [place] Upper Extremity Venous Duplex  Result Date: 03/10/2018 UPPER VENOUS STUDY  Indications: Swelling Performing Technologist: Belenda Cruise  Collins RVT  Examination Guidelines: A complete evaluation includes B-mode imaging, spectral Doppler, color Doppler, and power Doppler as needed of all accessible portions of each vessel. Bilateral testing is considered an integral part of a complete examination. Limited examinations for reoccurring indications may be performed as noted.  Right Findings: +----------+------------+----------+---------+-----------+-------+ RIGHT     CompressiblePropertiesPhasicitySpontaneousSummary +----------+------------+----------+---------+-----------+-------+ Subclavian    Full                 Yes       Yes            +----------+------------+----------+---------+-----------+-------+  Left  Findings: +----------+------------+----------+---------+-----------+-----------------+ LEFT      CompressiblePropertiesPhasicitySpontaneous     Summary      +----------+------------+----------+---------+-----------+-----------------+ IJV           Full                 Yes       Yes                      +----------+------------+----------+---------+-----------+-----------------+ Subclavian    Full                 Yes       Yes                      +----------+------------+----------+---------+-----------+-----------------+ Axillary      Full                 Yes       Yes                      +----------+------------+----------+---------+-----------+-----------------+ Brachial      Full                 Yes       Yes                      +----------+------------+----------+---------+-----------+-----------------+ Radial        Full                                                    +----------+------------+----------+---------+-----------+-----------------+ Ulnar         Full                                                    +----------+------------+----------+---------+-----------+-----------------+ Cephalic    Partial                                 Age Indeterminate +----------+------------+----------+---------+-----------+-----------------+ Basilic       Full                                                    +----------+------------+----------+---------+-----------+-----------------+  Summary:  Right: No evidence of thrombosis in the subclavian.  Left: No evidence of deep vein thrombosis in the upper extremity. Findings consistent with age indeterminate superficial vein thrombosis involving the left cephalic vein.  *See table(s)  above for measurements and observations.  Diagnosing physician: Deitra Mayo MD Electronically signed by Deitra Mayo MD on 03/10/2018 at 3:48:38 PM.    Final     Assessment/Plan  #1 cough- will check a  chest x-ray to rule out any suspicions of aspiration-also will add as needed Mucinex for 7 days 600 mg twice daily also monitor vital signs pulse ox every shift for 72 hours to keep an eye on this  At this point appears stable- cough appears to be somewhat intermittent.  She does continue on Flonase once a day routinely  2.  History of stage III chronic kidney disease last creatinine was 1.57 BUN of 28 will update this to ensure stability.  3.  History of atrial fibrillation rapid ventricular rate rate appears to be controlled I got a pulse in the 60s see previous listed 1 of 56 this will have to be watched but she appears to be stable and asymptomatic-she is on amiodarone and Metroprolol will have follow-up by cardiology-she is on Eliquis for anticoagulation.  4.  Hypertension this appears fairly stable I got mildly elevated systolic in the 163A today but this does not appear to be consistent we will continue to monitor she is on Norvasc and Metroprolol  #5 history of neuropathic pain with hemiplegia- she has been started on Neurontin at this point will monitor she also has an order for tramadol- if Neurontin is not effective consider adding baclofen at this point will see if the Neurontin which was started 3 days ago for 3 times a day will be effective   [id number]

## 2018-03-28 ENCOUNTER — Encounter (HOSPITAL_COMMUNITY)
Admission: RE | Admit: 2018-03-28 | Discharge: 2018-03-28 | Disposition: A | Discharge status: Home / Self Care | Payer: Medicare Other | Source: Skilled Nursing Facility | Attending: Family Medicine | Admitting: Family Medicine

## 2018-03-28 LAB — BASIC METABOLIC PANEL
ANION GAP: 7 (ref 5–15)
BUN: 26 mg/dL — ABNORMAL HIGH (ref 8–23)
CHLORIDE: 105 mmol/L (ref 98–111)
CO2: 27 mmol/L (ref 22–32)
Calcium: 8.6 mg/dL — ABNORMAL LOW (ref 8.9–10.3)
Creatinine, Ser: 1.5 mg/dL — ABNORMAL HIGH (ref 0.44–1.00)
GFR calc Af Amer: 36 mL/min — ABNORMAL LOW (ref >60)
GFR calc non Af Amer: 31 mL/min — ABNORMAL LOW (ref >60)
GLUCOSE: 88 mg/dL (ref 70–99)
POTASSIUM: 4.6 mmol/L (ref 3.5–5.1)
Sodium: 139 mmol/L (ref 135–145)

## 2018-03-29 ENCOUNTER — Encounter: Payer: Self-pay | Admitting: Internal Medicine

## 2018-03-30 ENCOUNTER — Encounter: Payer: Self-pay | Admitting: *Deleted

## 2018-03-31 ENCOUNTER — Other Ambulatory Visit: Payer: Self-pay | Admitting: *Deleted

## 2018-03-31 NOTE — Patient Outreach (Signed)
[facility]) Care Management  03/31/2018  Brenda Hall 06/24/1930 [id number]   Onsite IDT meeting. Patient has made little progress and is still max assist with 2 for transfers and 3 person for standing.  Discharge plan is to go home.  Met with patient in room. Started discussing [facility] care management and RNCM role, patient states she does not feel like talking today. Did not leave any information.  Plan to follow up at next facility and collaborate with [facility]. Royetta Crochet. Laymond Purser, RN, BSN, [place] [phone]) Business Cell  [phone]) Toll Free Office

## 2018-04-07 ENCOUNTER — Other Ambulatory Visit: Payer: Self-pay | Admitting: *Deleted

## 2018-04-07 DIAGNOSIS — Z90710 Acquired absence of both cervix and uterus: Secondary | ICD-10-CM

## 2018-04-07 DIAGNOSIS — I272 Pulmonary hypertension, unspecified: Secondary | ICD-10-CM | POA: Diagnosis present

## 2018-04-07 DIAGNOSIS — I480 Paroxysmal atrial fibrillation: Secondary | ICD-10-CM | POA: Diagnosis present

## 2018-04-07 DIAGNOSIS — I5032 Chronic diastolic (congestive) heart failure: Secondary | ICD-10-CM | POA: Diagnosis present

## 2018-04-07 DIAGNOSIS — I2510 Atherosclerotic heart disease of native coronary artery without angina pectoris: Secondary | ICD-10-CM | POA: Diagnosis present

## 2018-04-07 DIAGNOSIS — I252 Old myocardial infarction: Secondary | ICD-10-CM

## 2018-04-07 DIAGNOSIS — I495 Sick sinus syndrome: Secondary | ICD-10-CM | POA: Diagnosis present

## 2018-04-07 DIAGNOSIS — J9601 Acute respiratory failure with hypoxia: Secondary | ICD-10-CM | POA: Diagnosis not present

## 2018-04-07 DIAGNOSIS — K589 Irritable bowel syndrome without diarrhea: Secondary | ICD-10-CM | POA: Diagnosis present

## 2018-04-07 DIAGNOSIS — I69354 Hemiplegia and hemiparesis following cerebral infarction affecting left non-dominant side: Secondary | ICD-10-CM

## 2018-04-07 DIAGNOSIS — M353 Polymyalgia rheumatica: Secondary | ICD-10-CM | POA: Diagnosis present

## 2018-04-07 DIAGNOSIS — Z515 Encounter for palliative care: Secondary | ICD-10-CM | POA: Diagnosis not present

## 2018-04-07 DIAGNOSIS — A419 Sepsis, unspecified organism: Principal | ICD-10-CM | POA: Diagnosis present

## 2018-04-07 DIAGNOSIS — M549 Dorsalgia, unspecified: Secondary | ICD-10-CM | POA: Diagnosis present

## 2018-04-07 DIAGNOSIS — Z8619 Personal history of other infectious and parasitic diseases: Secondary | ICD-10-CM

## 2018-04-07 DIAGNOSIS — Z79899 Other long term (current) drug therapy: Secondary | ICD-10-CM

## 2018-04-07 DIAGNOSIS — Z66 Do not resuscitate: Secondary | ICD-10-CM | POA: Diagnosis present

## 2018-04-07 DIAGNOSIS — Z96653 Presence of artificial knee joint, bilateral: Secondary | ICD-10-CM | POA: Diagnosis present

## 2018-04-07 DIAGNOSIS — Z7901 Long term (current) use of anticoagulants: Secondary | ICD-10-CM

## 2018-04-07 DIAGNOSIS — I130 Hypertensive heart and chronic kidney disease with heart failure and stage 1 through stage 4 chronic kidney disease, or unspecified chronic kidney disease: Secondary | ICD-10-CM | POA: Diagnosis present

## 2018-04-07 DIAGNOSIS — L89151 Pressure ulcer of sacral region, stage 1: Secondary | ICD-10-CM | POA: Diagnosis present

## 2018-04-07 DIAGNOSIS — N179 Acute kidney failure, unspecified: Secondary | ICD-10-CM | POA: Diagnosis present

## 2018-04-07 DIAGNOSIS — R6521 Severe sepsis with septic shock: Secondary | ICD-10-CM | POA: Diagnosis not present

## 2018-04-07 DIAGNOSIS — M810 Age-related osteoporosis without current pathological fracture: Secondary | ICD-10-CM | POA: Diagnosis present

## 2018-04-07 DIAGNOSIS — Z7951 Long term (current) use of inhaled steroids: Secondary | ICD-10-CM

## 2018-04-07 DIAGNOSIS — N390 Urinary tract infection, site not specified: Secondary | ICD-10-CM | POA: Diagnosis present

## 2018-04-07 DIAGNOSIS — Z7401 Bed confinement status: Secondary | ICD-10-CM

## 2018-04-07 DIAGNOSIS — J690 Pneumonitis due to inhalation of food and vomit: Secondary | ICD-10-CM | POA: Diagnosis not present

## 2018-04-07 DIAGNOSIS — R0602 Shortness of breath: Secondary | ICD-10-CM | POA: Diagnosis not present

## 2018-04-07 DIAGNOSIS — R1310 Dysphagia, unspecified: Secondary | ICD-10-CM | POA: Diagnosis present

## 2018-04-07 DIAGNOSIS — N183 Chronic kidney disease, stage 3 (moderate): Secondary | ICD-10-CM | POA: Diagnosis present

## 2018-04-07 DIAGNOSIS — Z8249 Family history of ischemic heart disease and other diseases of the circulatory system: Secondary | ICD-10-CM

## 2018-04-07 DIAGNOSIS — I69391 Dysphagia following cerebral infarction: Secondary | ICD-10-CM

## 2018-04-07 DIAGNOSIS — F329 Major depressive disorder, single episode, unspecified: Secondary | ICD-10-CM | POA: Diagnosis present

## 2018-04-07 DIAGNOSIS — E785 Hyperlipidemia, unspecified: Secondary | ICD-10-CM | POA: Diagnosis present

## 2018-04-07 DIAGNOSIS — Z981 Arthrodesis status: Secondary | ICD-10-CM

## 2018-04-07 DIAGNOSIS — K921 Melena: Secondary | ICD-10-CM | POA: Diagnosis not present

## 2018-04-07 DIAGNOSIS — Y95 Nosocomial condition: Secondary | ICD-10-CM | POA: Diagnosis present

## 2018-04-07 DIAGNOSIS — Z888 Allergy status to other drugs, medicaments and biological substances status: Secondary | ICD-10-CM

## 2018-04-07 DIAGNOSIS — Z885 Allergy status to narcotic agent status: Secondary | ICD-10-CM

## 2018-04-07 DIAGNOSIS — Z79890 Hormone replacement therapy (postmenopausal): Secondary | ICD-10-CM

## 2018-04-07 DIAGNOSIS — E876 Hypokalemia: Secondary | ICD-10-CM | POA: Diagnosis present

## 2018-04-07 DIAGNOSIS — G8929 Other chronic pain: Secondary | ICD-10-CM | POA: Diagnosis present

## 2018-04-07 DIAGNOSIS — Z85820 Personal history of malignant melanoma of skin: Secondary | ICD-10-CM

## 2018-04-07 DIAGNOSIS — R710 Precipitous drop in hematocrit: Secondary | ICD-10-CM | POA: Diagnosis not present

## 2018-04-07 NOTE — Patient Outreach (Signed)
[facility]) Care Management  04/07/2018  Brenda Hall December 11, 1930 [id number]   Onsite IDT meeting at facility.  Patient still needs a lot of care, she is a 2 person stand pivot transfer, she is on thickened liquids, weight is down and patient has decreased appetite. Facility states they feel patient could use more time due to stroke.  [facility] nurse offered a peer to peer with [facility] medical director to review patient case.  Met with patient, she was not talkative.  RNCM was able to briefly discuss [facility] and requested that I leave some a packet and RNCM contact for her son to review.   Plan to follow up as indicated, will collaborate with [facility] as indicated.  Royetta Crochet. Laymond Purser, RN, BSN, [place] [phone]) Business Cell  [phone]) Toll Free Office

## 2018-04-08 ENCOUNTER — Emergency Department (HOSPITAL_COMMUNITY): Payer: Medicare Other

## 2018-04-08 ENCOUNTER — Other Ambulatory Visit: Payer: Self-pay

## 2018-04-08 ENCOUNTER — Inpatient Hospital Stay (HOSPITAL_COMMUNITY)
Admission: EM | Admit: 2018-04-08 | Discharge: 2018-04-13 | DRG: 871 | Disposition: A | Discharge status: Hospice | Payer: Medicare Other | Attending: Internal Medicine | Admitting: Internal Medicine

## 2018-04-08 ENCOUNTER — Encounter (HOSPITAL_COMMUNITY): Payer: Self-pay | Admitting: Emergency Medicine

## 2018-04-08 DIAGNOSIS — Z7401 Bed confinement status: Secondary | ICD-10-CM | POA: Diagnosis not present

## 2018-04-08 DIAGNOSIS — I495 Sick sinus syndrome: Secondary | ICD-10-CM

## 2018-04-08 DIAGNOSIS — K921 Melena: Secondary | ICD-10-CM | POA: Diagnosis not present

## 2018-04-08 DIAGNOSIS — Z7189 Other specified counseling: Secondary | ICD-10-CM

## 2018-04-08 DIAGNOSIS — J690 Pneumonitis due to inhalation of food and vomit: Secondary | ICD-10-CM | POA: Diagnosis present

## 2018-04-08 DIAGNOSIS — R1310 Dysphagia, unspecified: Secondary | ICD-10-CM

## 2018-04-08 DIAGNOSIS — N179 Acute kidney failure, unspecified: Secondary | ICD-10-CM

## 2018-04-08 DIAGNOSIS — F329 Major depressive disorder, single episode, unspecified: Secondary | ICD-10-CM | POA: Diagnosis present

## 2018-04-08 DIAGNOSIS — Z515 Encounter for palliative care: Secondary | ICD-10-CM | POA: Diagnosis not present

## 2018-04-08 DIAGNOSIS — Z8673 Personal history of transient ischemic attack (TIA), and cerebral infarction without residual deficits: Secondary | ICD-10-CM | POA: Diagnosis not present

## 2018-04-08 DIAGNOSIS — I69354 Hemiplegia and hemiparesis following cerebral infarction affecting left non-dominant side: Secondary | ICD-10-CM | POA: Diagnosis not present

## 2018-04-08 DIAGNOSIS — N390 Urinary tract infection, site not specified: Secondary | ICD-10-CM | POA: Diagnosis not present

## 2018-04-08 DIAGNOSIS — I480 Paroxysmal atrial fibrillation: Secondary | ICD-10-CM | POA: Diagnosis not present

## 2018-04-08 DIAGNOSIS — I10 Essential (primary) hypertension: Secondary | ICD-10-CM | POA: Diagnosis not present

## 2018-04-08 DIAGNOSIS — J9601 Acute respiratory failure with hypoxia: Secondary | ICD-10-CM | POA: Insufficient documentation

## 2018-04-08 DIAGNOSIS — R6521 Severe sepsis with septic shock: Secondary | ICD-10-CM

## 2018-04-08 DIAGNOSIS — I130 Hypertensive heart and chronic kidney disease with heart failure and stage 1 through stage 4 chronic kidney disease, or unspecified chronic kidney disease: Secondary | ICD-10-CM | POA: Diagnosis present

## 2018-04-08 DIAGNOSIS — L8990 Pressure ulcer of unspecified site, unspecified stage: Secondary | ICD-10-CM

## 2018-04-08 DIAGNOSIS — A419 Sepsis, unspecified organism: Secondary | ICD-10-CM

## 2018-04-08 DIAGNOSIS — R1312 Dysphagia, oropharyngeal phase: Secondary | ICD-10-CM | POA: Diagnosis not present

## 2018-04-08 DIAGNOSIS — N183 Chronic kidney disease, stage 3 (moderate): Secondary | ICD-10-CM | POA: Diagnosis not present

## 2018-04-08 DIAGNOSIS — L89151 Pressure ulcer of sacral region, stage 1: Secondary | ICD-10-CM | POA: Diagnosis present

## 2018-04-08 DIAGNOSIS — I69391 Dysphagia following cerebral infarction: Secondary | ICD-10-CM | POA: Diagnosis not present

## 2018-04-08 DIAGNOSIS — I6789 Other cerebrovascular disease: Secondary | ICD-10-CM | POA: Diagnosis not present

## 2018-04-08 DIAGNOSIS — R195 Other fecal abnormalities: Secondary | ICD-10-CM | POA: Diagnosis not present

## 2018-04-08 DIAGNOSIS — R0602 Shortness of breath: Secondary | ICD-10-CM | POA: Diagnosis not present

## 2018-04-08 DIAGNOSIS — I272 Pulmonary hypertension, unspecified: Secondary | ICD-10-CM | POA: Diagnosis present

## 2018-04-08 DIAGNOSIS — M6281 Muscle weakness (generalized): Secondary | ICD-10-CM | POA: Diagnosis not present

## 2018-04-08 DIAGNOSIS — N170 Acute kidney failure with tubular necrosis: Secondary | ICD-10-CM | POA: Diagnosis not present

## 2018-04-08 DIAGNOSIS — Y95 Nosocomial condition: Secondary | ICD-10-CM | POA: Diagnosis present

## 2018-04-08 DIAGNOSIS — I5032 Chronic diastolic (congestive) heart failure: Secondary | ICD-10-CM | POA: Diagnosis present

## 2018-04-08 DIAGNOSIS — Z66 Do not resuscitate: Secondary | ICD-10-CM | POA: Diagnosis present

## 2018-04-08 DIAGNOSIS — E785 Hyperlipidemia, unspecified: Secondary | ICD-10-CM | POA: Diagnosis present

## 2018-04-08 DIAGNOSIS — E876 Hypokalemia: Secondary | ICD-10-CM | POA: Diagnosis present

## 2018-04-08 DIAGNOSIS — R410 Disorientation, unspecified: Secondary | ICD-10-CM | POA: Diagnosis not present

## 2018-04-08 DIAGNOSIS — R710 Precipitous drop in hematocrit: Secondary | ICD-10-CM | POA: Diagnosis not present

## 2018-04-08 DIAGNOSIS — I2510 Atherosclerotic heart disease of native coronary artery without angina pectoris: Secondary | ICD-10-CM | POA: Diagnosis not present

## 2018-04-08 LAB — COMPREHENSIVE METABOLIC PANEL
ALT: 39 U/L (ref 0–44)
ALT: 30 U/L (ref 0–44)
AST: 71 U/L — ABNORMAL HIGH (ref 15–41)
AST: 51 U/L — ABNORMAL HIGH (ref 15–41)
Albumin: 2.3 g/dL — ABNORMAL LOW (ref 3.5–5.0)
Albumin: 1.9 g/dL — ABNORMAL LOW (ref 3.5–5.0)
Alkaline Phosphatase: 85 U/L (ref 38–126)
Alkaline Phosphatase: 66 U/L (ref 38–126)
Anion gap: 11 (ref 5–15)
Anion gap: 7 (ref 5–15)
BUN: 58 mg/dL — ABNORMAL HIGH (ref 8–23)
BUN: 50 mg/dL — ABNORMAL HIGH (ref 8–23)
CO2: 24 mmol/L (ref 22–32)
CO2: 20 mmol/L — ABNORMAL LOW (ref 22–32)
CREATININE: 1.86 mg/dL — AB (ref 0.44–1.00)
Calcium: 8.5 mg/dL — ABNORMAL LOW (ref 8.9–10.3)
Calcium: 7.2 mg/dL — ABNORMAL LOW (ref 8.9–10.3)
Chloride: 108 mmol/L (ref 98–111)
Chloride: 117 mmol/L — ABNORMAL HIGH (ref 98–111)
Creatinine, Ser: 2.21 mg/dL — ABNORMAL HIGH (ref 0.44–1.00)
GFR calc Af Amer: 23 mL/min — ABNORMAL LOW (ref >60)
GFR calc Af Amer: 28 mL/min — ABNORMAL LOW (ref >60)
GFR calc non Af Amer: 20 mL/min — ABNORMAL LOW (ref >60)
GFR calc non Af Amer: 24 mL/min — ABNORMAL LOW (ref >60)
Glucose, Bld: 127 mg/dL — ABNORMAL HIGH (ref 70–99)
Glucose, Bld: 154 mg/dL — ABNORMAL HIGH (ref 70–99)
Potassium: 3.9 mmol/L (ref 3.5–5.1)
Potassium: 3.5 mmol/L (ref 3.5–5.1)
Sodium: 143 mmol/L (ref 135–145)
Sodium: 144 mmol/L (ref 135–145)
Total Bilirubin: 0.7 mg/dL (ref 0.3–1.2)
Total Bilirubin: 0.7 mg/dL (ref 0.3–1.2)
Total Protein: 7 g/dL (ref 6.5–8.1)
Total Protein: 5.6 g/dL — ABNORMAL LOW (ref 6.5–8.1)

## 2018-04-08 LAB — CBC WITH DIFFERENTIAL/PLATELET
Abs Immature Granulocytes: 0.06 10*3/uL (ref 0.00–0.07)
BASOS ABS: 0.1 10*3/uL (ref 0.0–0.1)
Basophils Relative: 1 %
Eosinophils Absolute: 0 10*3/uL (ref 0.0–0.5)
Eosinophils Relative: 0 %
HCT: 40.4 % (ref 36.0–46.0)
Hemoglobin: 11.9 g/dL — ABNORMAL LOW (ref 12.0–15.0)
IMMATURE GRANULOCYTES: 1 %
LYMPHS ABS: 0.6 10*3/uL — AB (ref 0.7–4.0)
Lymphocytes Relative: 6 %
MCH: 25.9 pg — ABNORMAL LOW (ref 26.0–34.0)
MCHC: 29.5 g/dL — ABNORMAL LOW (ref 30.0–36.0)
MCV: 87.8 fL (ref 80.0–100.0)
MONOS PCT: 4 %
Monocytes Absolute: 0.4 10*3/uL (ref 0.1–1.0)
NRBC: 0 % (ref 0.0–0.2)
Neutro Abs: 8.6 10*3/uL — ABNORMAL HIGH (ref 1.7–7.7)
Neutrophils Relative %: 88 %
Platelets: 475 10*3/uL — ABNORMAL HIGH (ref 150–400)
RBC: 4.6 MIL/uL (ref 3.87–5.11)
RDW: 16.4 % — ABNORMAL HIGH (ref 11.5–15.5)
WBC: 9.6 10*3/uL (ref 4.0–10.5)

## 2018-04-08 LAB — INFLUENZA PANEL BY PCR (TYPE A & B)
Influenza A By PCR: NEGATIVE
Influenza B By PCR: NEGATIVE

## 2018-04-08 LAB — CBC
HCT: 32.1 % — ABNORMAL LOW (ref 36.0–46.0)
HEMOGLOBIN: 9.3 g/dL — AB (ref 12.0–15.0)
MCH: 26 pg (ref 26.0–34.0)
MCHC: 29 g/dL — ABNORMAL LOW (ref 30.0–36.0)
MCV: 89.7 fL (ref 80.0–100.0)
Platelets: 401 10*3/uL — ABNORMAL HIGH (ref 150–400)
RBC: 3.58 MIL/uL — AB (ref 3.87–5.11)
RDW: 16.2 % — ABNORMAL HIGH (ref 11.5–15.5)
WBC: 15.3 10*3/uL — ABNORMAL HIGH (ref 4.0–10.5)
nRBC: 0 % (ref 0.0–0.2)

## 2018-04-08 LAB — BLOOD GAS, ARTERIAL
ACID-BASE DEFICIT: 3.8 mmol/L — AB (ref 0.0–2.0)
Acid-Base Excess: 1.6 mmol/L (ref 0.0–2.0)
BICARBONATE: 21 mmol/L (ref 20.0–28.0)
Bicarbonate: 25.8 mmol/L (ref 20.0–28.0)
Delivery systems: POSITIVE
Delivery systems: POSITIVE
Drawn by: 213101
Drawn by: 419771
Expiratory PAP: 6
Expiratory PAP: 6
FIO2: 100
FIO2: 60
Inspiratory PAP: 14
Inspiratory PAP: 14
O2 Saturation: 89.4 %
O2 Saturation: 98 %
Patient temperature: 37
RATE: 10 resp/min
pCO2 arterial: 37.5 mmHg (ref 32.0–48.0)
pCO2 arterial: 41.9 mmHg (ref 32.0–48.0)
pH, Arterial: 7.326 — ABNORMAL LOW (ref 7.350–7.450)
pH, Arterial: 7.444 (ref 7.350–7.450)
pO2, Arterial: 126 mmHg — ABNORMAL HIGH (ref 83.0–108.0)
pO2, Arterial: 58.7 mmHg — ABNORMAL LOW (ref 83.0–108.0)

## 2018-04-08 LAB — PROCALCITONIN: Procalcitonin: 3.1 ng/mL

## 2018-04-08 LAB — URINALYSIS, ROUTINE W REFLEX MICROSCOPIC
Bilirubin Urine: NEGATIVE
Glucose, UA: NEGATIVE mg/dL
Hgb urine dipstick: NEGATIVE
Ketones, ur: NEGATIVE mg/dL
NITRITE: NEGATIVE
PH: 5 (ref 5.0–8.0)
Protein, ur: NEGATIVE mg/dL
SPECIFIC GRAVITY, URINE: 1.017 (ref 1.005–1.030)

## 2018-04-08 LAB — I-STAT CG4 LACTIC ACID, ED
Lactic Acid, Venous: 1.76 mmol/L (ref 0.5–1.9)
Lactic Acid, Venous: 2.79 mmol/L (ref 0.5–1.9)

## 2018-04-08 LAB — I-STAT CHEM 8, ED
BUN: 48 mg/dL — ABNORMAL HIGH (ref 8–23)
Calcium, Ion: 1.12 mmol/L — ABNORMAL LOW (ref 1.15–1.40)
Chloride: 108 mmol/L (ref 98–111)
Creatinine, Ser: 2.3 mg/dL — ABNORMAL HIGH (ref 0.44–1.00)
Glucose, Bld: 124 mg/dL — ABNORMAL HIGH (ref 70–99)
HCT: 38 % (ref 36.0–46.0)
HEMOGLOBIN: 12.9 g/dL (ref 12.0–15.0)
Potassium: 4 mmol/L (ref 3.5–5.1)
Sodium: 145 mmol/L (ref 135–145)
TCO2: 25 mmol/L (ref 22–32)

## 2018-04-08 LAB — PROTIME-INR
INR: 2.93
Prothrombin Time: 30.1 seconds — ABNORMAL HIGH (ref 11.4–15.2)

## 2018-04-08 LAB — MRSA PCR SCREENING: MRSA by PCR: NEGATIVE

## 2018-04-08 LAB — TYPE AND SCREEN
ABO/RH(D): AB POS
ANTIBODY SCREEN: NEGATIVE

## 2018-04-08 LAB — I-STAT TROPONIN, ED: Troponin i, poc: 0.05 ng/mL (ref 0.00–0.08)

## 2018-04-08 MED ORDER — SODIUM CHLORIDE 0.9 % IV BOLUS
500.0000 mL | Freq: Once | INTRAVENOUS | Status: AC
  Administered 2018-04-08: 500 mL via INTRAVENOUS

## 2018-04-08 MED ORDER — SODIUM CHLORIDE 0.9 % IV BOLUS
1000.0000 mL | Freq: Once | INTRAVENOUS | Status: AC
  Administered 2018-04-08: 1000 mL via INTRAVENOUS

## 2018-04-08 MED ORDER — VANCOMYCIN HCL IN DEXTROSE 1-5 GM/200ML-% IV SOLN
1000.0000 mg | INTRAVENOUS | Status: DC
  Administered 2018-04-09: 1000 mg via INTRAVENOUS
  Filled 2018-04-08: qty 200

## 2018-04-08 MED ORDER — FENTANYL 2500MCG IN NS 250ML (10MCG/ML) PREMIX INFUSION
INTRAVENOUS | Status: AC
  Filled 2018-04-08: qty 250

## 2018-04-08 MED ORDER — SODIUM CHLORIDE 0.9 % IV SOLN
2.0000 g | Freq: Once | INTRAVENOUS | Status: AC
  Administered 2018-04-08: 2 g via INTRAVENOUS
  Filled 2018-04-08: qty 2

## 2018-04-08 MED ORDER — PIPERACILLIN-TAZOBACTAM 3.375 G IVPB
3.3750 g | Freq: Three times a day (TID) | INTRAVENOUS | Status: DC
  Administered 2018-04-08 – 2018-04-12 (×13): 3.375 g via INTRAVENOUS
  Filled 2018-04-08 (×12): qty 50

## 2018-04-08 MED ORDER — RESOURCE THICKENUP CLEAR PO POWD
ORAL | Status: DC | PRN
  Filled 2018-04-08: qty 125

## 2018-04-08 MED ORDER — APIXABAN 5 MG PO TABS: 5.0000 mg | ORAL_TABLET | Freq: Two times a day (BID) | ORAL | Status: DC

## 2018-04-08 MED ORDER — FAMOTIDINE 20 MG PO TABS
20.0000 mg | ORAL_TABLET | Freq: Two times a day (BID) | ORAL | Status: DC
  Administered 2018-04-08 – 2018-04-09 (×2): 20 mg via ORAL
  Filled 2018-04-08 (×2): qty 1

## 2018-04-08 MED ORDER — SODIUM CHLORIDE 0.9 % IV BOLUS (SEPSIS)
1000.0000 mL | Freq: Once | INTRAVENOUS | Status: AC
  Administered 2018-04-08: 1000 mL via INTRAVENOUS

## 2018-04-08 MED ORDER — ACETAMINOPHEN 650 MG RE SUPP
650.0000 mg | Freq: Once | RECTAL | Status: AC
  Administered 2018-04-08: 650 mg via RECTAL

## 2018-04-08 MED ORDER — ACETAMINOPHEN 650 MG RE SUPP
RECTAL | Status: AC
  Filled 2018-04-08: qty 1

## 2018-04-08 MED ORDER — AMIODARONE HCL 200 MG PO TABS
200.0000 mg | ORAL_TABLET | Freq: Every day | ORAL | Status: DC
  Administered 2018-04-08 – 2018-04-12 (×5): 200 mg via ORAL
  Filled 2018-04-08 (×5): qty 1

## 2018-04-08 MED ORDER — ACETAMINOPHEN 650 MG RE SUPP
650.0000 mg | Freq: Four times a day (QID) | RECTAL | Status: DC | PRN
  Administered 2018-04-09 (×2): 650 mg via RECTAL
  Filled 2018-04-08 (×3): qty 1

## 2018-04-08 MED ORDER — VANCOMYCIN HCL IN DEXTROSE 1-5 GM/200ML-% IV SOLN
1000.0000 mg | Freq: Once | INTRAVENOUS | Status: AC
  Administered 2018-04-08: 1000 mg via INTRAVENOUS
  Filled 2018-04-08: qty 200

## 2018-04-08 MED ORDER — ACETAMINOPHEN 325 MG PO TABS
650.0000 mg | ORAL_TABLET | Freq: Four times a day (QID) | ORAL | Status: DC | PRN
  Administered 2018-04-10: 650 mg via ORAL
  Filled 2018-04-08: qty 2

## 2018-04-08 MED ORDER — MIDAZOLAM HCL 2 MG/2ML IJ SOLN: 1.0000 mg | INTRAMUSCULAR | Status: DC | PRN

## 2018-04-08 MED ORDER — SODIUM CHLORIDE 0.9 % IV SOLN: 1.0000 g | INTRAVENOUS | Status: DC

## 2018-04-08 MED ORDER — PHENYLEPHRINE HCL-NACL 10-0.9 MG/250ML-% IV SOLN
25.0000 ug/min | INTRAVENOUS | Status: DC
  Administered 2018-04-08: 75 ug/min via INTRAVENOUS
  Administered 2018-04-08 – 2018-04-09 (×2): 25 ug/min via INTRAVENOUS
  Filled 2018-04-08 (×3): qty 250

## 2018-04-08 MED ORDER — ONDANSETRON HCL 4 MG PO TABS: 4.0000 mg | ORAL_TABLET | Freq: Four times a day (QID) | ORAL | Status: DC | PRN

## 2018-04-08 MED ORDER — SODIUM CHLORIDE 0.9 % IV SOLN: 250.0000 mL | INTRAVENOUS | Status: DC

## 2018-04-08 MED ORDER — ONDANSETRON HCL 4 MG/2ML IJ SOLN
4.0000 mg | Freq: Four times a day (QID) | INTRAMUSCULAR | Status: DC | PRN
  Administered 2018-04-09: 4 mg via INTRAVENOUS
  Filled 2018-04-08 (×2): qty 2

## 2018-04-08 MED ORDER — SODIUM CHLORIDE 0.9 % IV SOLN
INTRAVENOUS | Status: DC
  Administered 2018-04-08 (×2): via INTRAVENOUS

## 2018-04-08 MED ORDER — ORAL CARE MOUTH RINSE
15.0000 mL | Freq: Two times a day (BID) | OROMUCOSAL | Status: DC
  Administered 2018-04-08 – 2018-04-13 (×7): 15 mL via OROMUCOSAL

## 2018-04-08 MED ORDER — FENTANYL CITRATE (PF) 100 MCG/2ML IJ SOLN: 50.0000 ug | INTRAMUSCULAR | Status: DC | PRN

## 2018-04-08 MED ORDER — SODIUM CHLORIDE 0.9 % IV BOLUS (SEPSIS)
250.0000 mL | Freq: Once | INTRAVENOUS | Status: AC
  Administered 2018-04-08: 250 mL via INTRAVENOUS

## 2018-04-08 MED ORDER — METOPROLOL TARTRATE 5 MG/5ML IV SOLN
2.5000 mg | Freq: Once | INTRAVENOUS | Status: AC
  Administered 2018-04-08: 2.5 mg via INTRAVENOUS
  Filled 2018-04-08: qty 5

## 2018-04-08 MED ORDER — MIDAZOLAM 50MG/50ML (1MG/ML) PREMIX INFUSION
INTRAVENOUS | Status: AC
  Filled 2018-04-08: qty 50

## 2018-04-08 NOTE — Progress Notes (Addendum)
Initial Nutrition Assessment  DOCUMENTATION CODES:  Not applicable  INTERVENTION:  Pt tearful over receiving puree/NTL liquids and her current state in general. Had supportive discussion surrounding GOC. See body of note for detail. In summary, pt feels things are being done TO her, not FOR her. She does not want to return to SNF. What is most important for her is to be home w/ her cats.   RD apprised MD of conversation. Will monitor outcome of GOC discussion with palliative.   RD helped obtain thickener/thickened liquids for pt in ICU.  Vital Shakes w/ meals, each supplement provides 480-500 kcals & 20-23g of protein. These are Nectar thick at baseline   NUTRITION DIAGNOSIS:  Swallowing difficulty related to Severe R CVA as evidenced by Requiring D1 diet and NTL per SLP.  GOAL:  Patient will meet greater than or equal to 90% of their needs   MONITOR:  PO intake, Supplement acceptance, Diet advancement, Labs, I & O's, Goals of Care  REASON FOR ASSESSMENT:  Malnutrition Screening Tool    ASSESSMENT:  84 y/o female PMHx Afib, MI, IBS, HTN, CAD, CKD3, MI. Recently suffered severe R CVA in Novermber requiring hospitalization x11 days. Has been at [name] since. She has severe deficits as a result of CVA, including dysphagia and L hemiparesis.   Pt well known to RD as she is resident of [name] affiliated SNF. There, she is on a D1 diet w/ NTL. She has been working w/ therapy there, but when last spoke to [name], pt no longer making any progress; still requires total care w/ ADLs. She cannot move L side. Oral Intake at SNF is suboptimal. She was 153 lbs at time of CVA in early Nov. Her last wt at [name] on 12/16 was 136.2   Today, pt emotional/tearful. RD overheard her  voicing her frustrations to family regarding diet restrictions & being unable to eat what she wants, even though she is heard coughing every time she tries to eat/drink.  Pt visibly upset on RD arrival. The first thing she tells RD is  "[name]". As such, conversation quickly led to goals of care. She tells RD she is tired of thickened liquids/puree. She also is tired of the discomforts of facility life and health treatments in general. At SNF, she feels ignored and that her care "isnt humane". She says she wakes up at night thirsty, but says staff wont bring her water. At meal times staff "just stuff food in my mouth" and this makes her feel "like a sausage". Even here at the hospital, she tells RD she received a shot in her arm today that was very painful. She asks why they did this to her. Overall, she feels as things are being done to her, not for her.    RD explained these things are done because they are in her health's best interest, not because staff wants to hurt her. RD emphasized staff also want to support her in what she wants. If she does not want shots or pureed meals, that is her right, but she needs to be aware this may lead to her becoming more sick and possibly dying. She does not care & wants to let nature take its course. She is aware of hospice and their focus on comfort. This is what she wants. She stresses she does not want to return to a facility. The most important thing to her is being home w/ her cats.   RD noted he is  not the one who is appropriate to finalize these decisions with her, but he would communicate her desires to MD. There is a palliative consult pending. RD asked her to voice her desires to her son (poa) as well. Apprised MD of conversation afterwards.  Labs: BUN/creat: 50/1.86, Albumin:1.9, WBC:15.3, Glu:154  Meds: Neosynephrine, IV abx, IVF  Recent Labs  Lab 04/08/18 0007 04/08/18 0013 04/08/18 0431  NA 143 145 144  K 3.9 4.0 3.5  CL 108 108 117*  CO2 24  --  20*  BUN 58* 48* 50*  CREATININE 2.21* 2.30* 1.86*  CALCIUM 8.5*  --  7.2*  GLUCOSE 127* 124* 154*   NUTRITION - FOCUSED PHYSICAL EXAM: Deferred given circumstances  Diet Order:   Diet Order            DIET -  DYS 1 Room service appropriate? Yes; Fluid consistency: Nectar Thick  Diet effective now             EDUCATION NEEDS:  No education needs have been identified at this time  Skin:  Skin Assessment: Reviewed RN Assessment  Last BM:  12/18  Height:  Ht Readings from Last 1 Encounters:  04/08/18 5\' 4"  (1.626 m)   Weight:  Wt Readings from Last 1 Encounters:  04/08/18 66.8 kg   Wt Readings from Last 10 Encounters:  04/08/18 66.8 kg  03/02/18 69.6 kg  02/24/18 69.6 kg  01/27/18 68.9 kg  12/05/17 69.7 kg  12/03/17 68.6 kg  11/28/17 68 kg  11/03/17 66.7 kg  10/31/17 66.3 kg  10/24/17 67.3 kg   Ideal Body Weight:  54.54 kg  BMI:  Body mass index is 25.28 kg/m.  Estimated Nutritional Needs:  Kcal:  1700-1900 (28-31 kcal/kg bw) Protein:  85-95g Pro (20% of needs) Fluid:  Per MD goals  Brenda Hall RD, LDN, CNSC Clinical Nutrition Available Tues-Sat via Pager: [phone] 04/08/2018 6:24 PM

## 2018-04-08 NOTE — ED Notes (Addendum)
CONTACT INFO:  Charlesetta Garibaldi (son POA): [phone] Raynaldo Opitz (son's wife): [phone] Fonnie Birkenhead (daughter): [phone]

## 2018-04-08 NOTE — Progress Notes (Addendum)
Pharmacy Antibiotic Note  Brenda Hall is a 84 y.o. female admitted on 04/08/2018 with pneumonia.  Pharmacy has been consulted for Vancomycin and Cefepime dosing.  Plan: Vancomycin 1000 mg IV every 48 hours.  Goal trough 15-20 mcg/mL.  Zosyn 3.375g IV every 8 hours. Monitor labs, c/s, and vanco trough as indicated.  Weight: 136 lb 3.2 oz (61.8 kg)  Temp (24hrs), Avg:99.1 F (37.3 C), Min:97.9 F (36.6 C), Max:100.9 F (38.3 C)  Recent Labs  Lab 04/08/18 0007 04/08/18 0013 04/08/18 0224 04/08/18 0431  WBC 9.6  --   --  15.3*  CREATININE 2.21* 2.30*  --  1.86*  LATICACIDVEN  --  2.79* 1.76  --     Estimated Creatinine Clearance: 19.9 mL/min (A) (by C-G formula based on SCr of 1.86 mg/dL (H)).    Allergies  Allergen Reactions  . Epinephrine Other (See Comments)    During dental procedure-caused facial swelling at injection site. Pt has had epinephrine since then for nose and facial surgery and did fine with it per pt.  . Clopidogrel Bisulfate Itching and Rash    Plavix  . Oxycodone     Hallucinations. Pt states that she can tolerate hydrocodone    Antimicrobials this admission: Zosyn 12/18 >> Vanco 12/18 >>  Cefepime 12/18 >> 12/18  Dose adjustments this admission: Vanco/Cefepime  Microbiology results: 12/18 BCx: pending 12/18 UCx: pending    Thank you for allowing pharmacy to be a part of this patient's care.  Ramond Craver 04/08/2018 8:06 AM

## 2018-04-08 NOTE — ED Provider Notes (Addendum)
[facility] EMERGENCY DEPARTMENT Provider Note   CSN: [id number] Arrival date & time: 04/07/18  2356     History   Chief Complaint Chief Complaint  Patient presents with  . Shortness of Breath    HPI Brenda Hall is a 84 y.o. female.  Level 5 caveat for respiratory distress and acuity of condition.  Patient from nursing home with sudden onset difficulty breathing over the past 1 hour.  EMS reports decreased mental status with initial O2 sat 60%.  She arrives on CPAP saturating 80%.  She denies chest pain.  No known history of COPD or CHF.  Is bedbound from previous stroke and left-sided weakness.  Patient told EMS she was "sick all day" but unknown what this means.  No known vomiting though patient does have food on her shirt.  On arrival she has hypotensive, hypoxic, hot to the touch  Discussed with patient's daughter and son on arrival.  Hulan Fess, daughter Lattie Haw.  They state patient is a full code and they would want everything done for her including intubation and life support. She does have a history of dysphagia from previous stroke and follows a thickened diet.  The history is provided by the patient and the EMS personnel. The history is limited by the condition of the patient.  Shortness of Breath     Past Medical History:  Diagnosis Date  . Anemia   . CAD in native artery    a. NSTEMI 12/2005 s/p BMS to Cx, prior normal LVEF.  Marland Kitchen Cancer ([place]) 1988 and  2009   BCC nose and  Melanoma back  . Cancer of skin Jan. 2014   [facility] Forehead and Nose  . Carotid stenosis    mild  . Chronic back pain   . CKD (chronic kidney disease), stage III ([place])   . Coronary atherosclerosis of unspecified type of vessel, native or graft   . DJD (degenerative joint disease) of knee   . History of TIA (transient ischemic attack)   . HTN (hypertension)   . IBS (irritable bowel syndrome)   . Myocardial infarction [facility])    5 yrs ago     dr wall  cardiac  . Osteoporosis   . Other  diseases of lung, not elsewhere classified    lung nodule  . Polymyalgia rheumatica ([place])   . Shingles 01/2016-02/2016  . Stroke [facility])     Patient Active Problem List   Diagnosis Date Noted  . Sick sinus syndrome ([place])   . Cerebrovascular accident (CVA) due to occlusion of right middle cerebral artery ([place]) 03/02/2018  . Atrial fibrillation with RVR ([place]) 10/24/2017  . Nausea 10/24/2017  . Acute bilateral low back pain without sciatica   . E. coli UTI   . Community acquired pneumonia   . L1 vertebral fracture ([place]) 09/27/2017  . UTI (urinary tract infection) 09/27/2017  . CKD (chronic kidney disease), stage III ([place])   . History of TIA (transient ischemic attack)   . PAF (paroxysmal atrial fibrillation) ([place])   . Atrial fibrillation ([place]) 02/12/2017  . Pulmonary edema 02/12/2017  . Urgency of micturition 02/12/2017  . Osteopenia of multiple sites 07/23/2016  . Age-related osteoporosis without current pathological fracture 07/11/2016  . History of total knee replacement, bilateral 07/11/2016  . History of COPD 07/11/2016  . Spondylosis of lumbar region without myelopathy or radiculopathy 07/11/2016  . Primary osteoarthritis of both hands 07/11/2016  . Primary osteoarthritis of both feet 07/11/2016  . Vitamin D deficiency 07/11/2016  .  History of vertebral fracture 07/11/2016  . History of IBS 07/11/2016  . Basal cell carcinoma 07/11/2016  . History of CVA (cerebrovascular accident)/ has metal implant not MRI safe  07/11/2016  . Other and unspecified hyperlipidemia 11/19/2012  . Cerebrovascular accident (stroke) ([place]) 11/19/2012  . Difficulty in walking(719.7) 11/06/2012  . Occlusion and stenosis of carotid artery without mention of cerebral infarction 02/07/2012  . OLD MYOCARDIAL INFARCTION 11/07/2009  . CAD, NATIVE VESSEL 11/07/2009  . Essential hypertension 09/28/2008  . IRRITABLE BOWEL SYNDROME 09/28/2008  . DEGENERATIVE JOINT DISEASE, KNEE 09/28/2008  . Polymyalgia  rheumatica ([place]) 09/28/2008  . Chest pain, unspecified 09/28/2008    Past Surgical History:  Procedure Laterality Date  . ABDOMINAL HYSTERECTOMY  1977   partial  . BACK SURGERY  1982  . CARDIAC CATHETERIZATION    . COLONOSCOPY N/A 01/27/2013   Procedure: COLONOSCOPY;  Surgeon: Rogene Houston, MD;  Location: [facility] END[facility] SUITE;  Service: Endoscopy;  Laterality: N/A;  1030  . EYE SURGERY Bilateral 04/2017  . EYE SURGERY Right 09/23/2017  . IR KYPHO LUMBAR INC FX REDUCE BONE BX UNI/BIL CANNULATION INC/IMAGING  09/30/2017  . IR VERTEBROPLASTY CERV/THOR BX INC UNI/BIL INC/INJECT/IMAGING  09/05/2017  . JOINT REPLACEMENT  2001 and  2011   6 knee surgeries   bil replacement  . KNEE ARTHROPLASTY    . KNEE ARTHROSCOPY    . LUMBAR LAMINECTOMY    . PR VEIN BYPASS GRAFT,AORTO-FEM-POP  2010  . removal of melanoma from back  2007  . SPINE SURGERY  2013   Broken back: fusion surgery  . tonisllectomy  1944  . total knee anthroplasty    . VERTEBROPLASTY  07/11/2011   Procedure: VERTEBROPLASTY;  Surgeon: Faythe Ghee, MD;  Location: [place] NEURO ORS;  Service: Neurosurgery;  Laterality: N/A;  Vertebroplasty of Lumbar Two     OB History   No obstetric history on file.      Home Medications    Prior to Admission medications   Medication Sig Start Date End Date Taking? Authorizing Provider  Abaloparatide (TYMLOS) 3120 MCG/1.56ML SOPN Inject 80 mcg into the skin daily. 12/05/17   Bo Merino, MD  acetaminophen (TYLENOL) 500 MG tablet Take 500 mg by mouth every 6 (six) hours as needed for mild pain.    [provider]  amiodarone (PACERONE) 200 MG tablet Take 200 mg by mouth daily.    [provider]  amLODipine (NORVASC) 5 MG tablet Take 5 mg by mouth daily.    [provider]  apixaban (ELIQUIS) 5 MG TABS tablet Take 1 tablet (5 mg total) by mouth 2 (two) times daily. 03/13/18   Nita Sells, MD  atorvastatin (LIPITOR) 80 MG tablet Take 1 tablet (80 mg total) by  mouth at bedtime. 01/27/18   [name], MD  Calcium Carbonate-Vit D-Min (CALCIUM 1200 PO) Take 1 tablet by mouth at bedtime.     [provider]  estrogens, conjugated, (PREMARIN) 0.3 MG tablet Take 1 tablet (0.3 mg total) by mouth every evening. 02/14/17   Johnson, Clanford L, MD  fluticasone (FLONASE) 50 MCG/ACT nasal spray Place 2 sprays into both nostrils daily.    [provider]  gabapentin (NEURONTIN) 100 MG capsule Take 100 mg by mouth 3 (three) times daily.    [provider]  hydrocortisone cream 1 % Apply sparingly to rectal once a day prn    [provider]  loperamide (IMODIUM) 2 MG capsule Take 2 mg by mouth daily as  needed for diarrhea or loose stools.     [provider]  Maltodextrin-Xanthan Gum (RESOURCE THICKENUP CLEAR) POWD Take 120 g by mouth as needed (nectar thick liquids). 03/13/18   Nita Sells, MD  metoprolol tartrate (LOPRESSOR) 100 MG tablet Take 1 tablet (100 mg total) by mouth 2 (two) times daily. 03/13/18   Nita Sells, MD  nitroGLYCERIN (NITROSTAT) 0.4 MG SL tablet Place 1 tablet (0.4 mg total) under the tongue every 5 (five) minutes as needed for chest pain. 12/03/16   [name], MD  pantoprazole (PROTONIX) 40 MG tablet Take 40 mg by mouth daily.    [provider]  sertraline (ZOLOFT) 50 MG tablet Take 50 mg by mouth daily.    [provider]  traMADol (ULTRAM) 50 MG tablet Take 1 tablet (50 mg total) by mouth every 4 (four) hours as needed. 03/24/18   Virgie Dad, MD  Zinc 50 MG CAPS Take 50 mg by mouth daily.    [provider]    Family History Family History  Problem Relation Age of Onset  . Heart disease Father   . Hypertension Father   . Heart attack Father   . Cancer Mother        bladder   . Hypertension Daughter   . Atrial fibrillation Daughter   . Hypertension Son   . Colon cancer Neg Hx     Social History Social History   Tobacco Use    . Smoking status: Never Smoker  . Smokeless tobacco: Never Used  . Tobacco comment: tobacco use - no  Substance Use Topics  . Alcohol use: Yes    Alcohol/week: 0.0 standard drinks    Comment: occasionally a glass of wine or margarita per pt  . Drug use: No     Allergies   Epinephrine; Clopidogrel bisulfate; and Oxycodone   Review of Systems Review of Systems  Unable to perform ROS: Acuity of condition  Respiratory: Positive for shortness of breath.      Physical Exam Updated Vital Signs BP (!) 96/48   Pulse 97   Resp (!) 26   Wt 61.8 kg   SpO2 100%   BMI 22.66 kg/m   Physical Exam Constitutional:      General: She is in acute distress.     Appearance: She is ill-appearing and toxic-appearing.     Comments: Chronically ill-appearing, obtunded on CPAP, Dry mucous membranes  HENT:     Head: Atraumatic.     Nose: Nose normal.     Mouth/Throat:     Mouth: Mucous membranes are dry.  Eyes:     Extraocular Movements: Extraocular movements intact.     Pupils: Pupils are equal, round, and reactive to light.  Neck:     Musculoskeletal: Normal range of motion and neck supple.  Cardiovascular:     Rate and Rhythm: Tachycardia present.  Pulmonary:     Effort: Tachypnea, accessory muscle usage and respiratory distress present.     Breath sounds: Rhonchi present.     Comments: Coarse rhonchi bilaterally with tachypnea Abdominal:     Tenderness: There is abdominal tenderness.     Comments: Suprapubic tenderness  Musculoskeletal:     Comments: Dependent edema of L arm  Skin:    General: Skin is warm.     Capillary Refill: Capillary refill takes less than 2 seconds.  Neurological:     Mental Status: She is alert.     Comments: L sided weakness at baseline Arouses  to voice, answers a few questions appropriately      ED Treatments / Results  Labs (all labs ordered are listed, but only abnormal results are displayed) Labs Reviewed  COMPREHENSIVE METABOLIC PANEL -  Abnormal; Notable for the following components:      Result Value   Glucose, Bld 127 (*)    BUN 58 (*)    Creatinine, Ser 2.21 (*)    Calcium 8.5 (*)    Albumin 2.3 (*)    AST 71 (*)    GFR calc non Af Amer 20 (*)    GFR calc Af Amer 23 (*)    All other components within normal limits  CBC WITH DIFFERENTIAL/PLATELET - Abnormal; Notable for the following components:   Hemoglobin 11.9 (*)    MCH 25.9 (*)    MCHC 29.5 (*)    RDW 16.4 (*)    Platelets 475 (*)    Neutro Abs 8.6 (*)    Lymphs Abs 0.6 (*)    All other components within normal limits  URINALYSIS, ROUTINE W REFLEX MICROSCOPIC - Abnormal; Notable for the following components:   APPearance CLOUDY (*)    Leukocytes, UA TRACE (*)    Bacteria, UA MANY (*)    All other components within normal limits  BLOOD GAS, ARTERIAL - Abnormal; Notable for the following components:   pO2, Arterial 58.7 (*)    All other components within normal limits  BLOOD GAS, ARTERIAL - Abnormal; Notable for the following components:   pH, Arterial 7.326 (*)    pO2, Arterial 126.0 (*)    Acid-base deficit 3.8 (*)    All other components within normal limits  I-STAT CG4 LACTIC ACID, ED - Abnormal; Notable for the following components:   Lactic Acid, Venous 2.79 (*)    All other components within normal limits  I-STAT CHEM 8, ED - Abnormal; Notable for the following components:   BUN 48 (*)    Creatinine, Ser 2.30 (*)    Glucose, Bld 124 (*)    Calcium, Ion 1.12 (*)    All other components within normal limits  CULTURE, BLOOD (ROUTINE X 2)  CULTURE, BLOOD (ROUTINE X 2)  URINE CULTURE  CBC  COMPREHENSIVE METABOLIC PANEL  I-STAT TROPONIN, ED  I-STAT CG4 LACTIC ACID, ED    EKG EKG Interpretation  Date/Time:  Wednesday April 08 2018 00:06:02 EST Ventricular Rate:  106 PR Interval:    QRS Duration: 107 QT Interval:  392 QTC Calculation: 521 R Axis:   125 Text Interpretation:  Sinus tachycardia Right axis deviation Nonspecific repol  abnormality, diffuse leads Prolonged QT interval similar lateral ST depression Confirmed by Ezequiel Essex [id number]) on 04/08/2018 12:30:58 AM   Radiology Dg Chest Port 1 View  Result Date: 04/08/2018 CLINICAL DATA:  Shortness of breath. EXAM: PORTABLE CHEST 1 VIEW COMPARISON:  Chest x-ray dated March 08, 2018. FINDINGS: Stable cardiomediastinal silhouette. Coarse interstitial and alveolar opacities in the left mid lung and both lung bases. No pleural effusion or pneumothorax. No acute osseous abnormality. IMPRESSION: 1. Coarse interstitial and alveolar opacities left mid lung and both lung bases could reflect pulmonary edema or pneumonia. Electronically Signed   By: Titus Dubin M.D.   On: 04/08/2018 00:55    Procedures Procedures (including critical care time)  Medications Ordered in ED Medications  vancomycin (VANCOCIN) IVPB 1000 mg/200 mL premix (has no administration in time range)  ceFEPIme (MAXIPIME) 2 g in sodium chloride 0.9 % 100 mL IVPB (has no administration in time  range)  sodium chloride 0.9 % bolus 1,000 mL (1,000 mLs Intravenous New Bag/Given 04/08/18 0019)    And  sodium chloride 0.9 % bolus 1,000 mL (1,000 mLs Intravenous New Bag/Given 04/08/18 0018)    And  sodium chloride 0.9 % bolus 250 mL (has no administration in time range)  fentaNYL (SUBLIMAZE) injection 50 mcg (has no administration in time range)  fentaNYL (SUBLIMAZE) injection 50 mcg (has no administration in time range)  midazolam (VERSED) injection 1 mg (has no administration in time range)  midazolam (VERSED) injection 1 mg (has no administration in time range)     Initial Impression / Assessment and Plan / ED Course  I have reviewed the triage vital signs and the nursing notes.  Pertinent labs & imaging results that were available during my care of the patient were reviewed by me and considered in my medical decision making (see chart for details).    Patient arrived in extremis with hypoxia,  respiratory distress, and hypotension.  Patient switched to BiPAP on arrival. Code sepsis activated.  Started IV fluids, broad-spectrum antibiotics after cultures obtained.  Preparations made for intubation which family is in agreement with.  Blood pressure and oxygenation improving with above measures. Patient becoming more alert and blood pressure and oxygenation are improving.  Will hold intubation at this time  Lactate 2.79.  ABG without significant CO2 retention. X-ray concerning for basilar and left-sided mid lobe pneumonia. Suspect aspiration.  Patient is becoming more alert on BiPAP.  Respiratory rate is around 30.  Oxygenation is 100% on FiO2 of 100% and will titrate.  Family agreeable with holding intubation at this time.  Patient resting comfortably on BiPAP.  She has received a 30 cc/kg bolus for septic shock.  Blood pressure is improving with starting to drift down again.  Will give additional bolus and start Neo-Synephrine. Patient also noted to have some bright red blood per rectum mixed with yellow and brown stool.  No melena.  She is on Eliquis and this will need to be monitored carefully.  Type and screen sent.  Repeat ABG shows improvement in PO2 with no worsening of PCO2. Patient comfortable on BiPAP and is mentating well and responsive.  Family agrees with holding intubation at this time. FiO2 weaned to 60%.  No ICU beds available.  Discussed potential transfer to [facility] but family prefers that patient stay here.  They are aware that patient will likely be in the ED all night.  CRITICAL CARE Performed by: Ezequiel Essex Total critical care time: 60 minutes Critical care time was exclusive of separately billable procedures and treating other patients. Critical care was necessary to treat or prevent imminent or life-threatening deterioration. Critical care was time spent personally by me on the following activities: development of treatment plan with patient and/or  surrogate as well as nursing, discussions with consultants, evaluation of patient's response to treatment, examination of patient, obtaining history from patient or surrogate, ordering and performing treatments and interventions, ordering and review of laboratory studies, ordering and review of radiographic studies, pulse oximetry and re-evaluation of patient's condition.  Sepsis - Repeat Assessment  Performed at:    [facility]     Blood pressure (!) 120/51, pulse (!) 55, temperature 98.2 F (36.8 C), resp. rate (!) 22, weight 61.8 kg, SpO2 99 %.  Heart:     Regular rate and rhythm  Lungs:    [place]  Capillary Refill:   <2 sec  Peripheral Pulse:   Radial pulse palpable  Skin:     Normal Color     Final Clinical Impressions(s) / ED Diagnoses   Final diagnoses:  Septic shock ([place])  Acute respiratory failure with hypoxia [facility])    ED Discharge Orders    None       Ezequiel Essex, MD 04/08/18 7011    Ezequiel Essex, MD 04/08/18 [phone]

## 2018-04-08 NOTE — ED Notes (Signed)
Pt removed from bipap and placed on 6L O2 via [place] per Dr. Carles Collet. Pt's O2 sat 99%. Pt reports her breathing feels better.

## 2018-04-08 NOTE — Progress Notes (Signed)
PROGRESS NOTE  Brenda Hall OLM:[id number] DOB: [birth year] DOA: 04/08/2018 PCP: [name], MD  Brief History:  84 year old female with a history of coronary disease, CKD stage III, recent right MCA stroke, sick sinus syndrome, proximal atrial fibrillation presenting with respiratory distress from her nursing facility.  Apparently, the patient began developing shortness of breath approximately 1 hour prior to arrival to the emergency department.  EMS reported decreased mental status with oxygen saturations in the 60% range.  The patient arrived in the emergency department on CPAP with saturations 80%.  The patient was recently discharged from the hospital after stay from 03/02/2018 through 03/13/2018 for treatment of right MCA stroke as well as paroxysmal atrial fibrillation.  The patient has been bedbound with left hemiparesis since discharge from the hospital.  Work-up in the emergency department revealed sepsis likely secondary to UTI and pneumonia.  The patient was started on vancomycin and cefepime.  The patient was placed on BiPAP initially and started on Neo-Synephrine for hypotension.  Assessment/Plan: Sepsis -Present at the time of admission -Secondary to UTI and pneumonia -Lactic acid peaked 1.76 -Check procalcitonin -Continue vancomycin -Switch to Zosyn to cover for aspiration -Follow blood and urine cultures  Healthcare associated pneumonia/aspiration pneumonia -The patient has dysphagia secondary to her stroke and is normally on the thickened diet -Continue Zosyn -Personally reviewed chest x-ray--increased right lower lobe and left lower lobe opacities -Check influenza PCR  Pyuria -Concerning for UTI -Continue Zosyn pending culture data  Paroxysmal atrial fibrillation -Presently in sinus rhythm -Personally reviewed EKG--sinus rhythm, nonspecific ST changes -Continue amiodarone  Acute on chronic renal failure--CKD stage III -Baseline creatinine  1.2-1.5 -Serum creatinine peaked 2.21 -Continue judicious IV fluids  Essential hypertension -Holding amlodipine and metoprolol tartrate secondary to sepsis and hypotension  Depression -Restart Zoloft and trazodone once able to tolerate po reliably  Hyperlipidemia -Restart Lipitor once able to tolerate p.o.  Hematochezia -The patient had bloody bowel movement in the emergency department -Holding apixaban temporarily -GI consult    Disposition Plan:   ICU Family Communication:   No Family at bedside  Consultants:  none  Code Status:  FULL   DVT Prophylaxis:  apixaban on hold   Procedures: As Listed in Progress Note Above  Antibiotics: vanco 12/18>>> Zosyn 12/18>>>  The patient is critically ill with multiple organ systems failure and requires high complexity decision making for assessment and support, frequent evaluation and titration of therapies, application of advanced monitoring technologies and extensive interpretation of multiple databases.  Critical care time - 35 mins.     Subjective: Patient is awake and alert.  She wants to take off the BiPAP mask.  She denies any chest pain, shortness breath, nausea, vomiting, diarrhea, abdominal pain, headache, neck pain.  Objective: Vitals:   04/08/18 0650 04/08/18 0700 04/08/18 0715 04/08/18 0730  BP: (!) 126/55 (!) 132/52 (!) 105/46 (!) 97/45  Pulse: (!) 59 60 (!) 58 (!) 56  Resp: 18 (!) 22 (!) 23 (!) 23  Temp: 97.9 F (36.6 C) 97.9 F (36.6 C) 98.1 F (36.7 C) 98.1 F (36.7 C)  TempSrc:      SpO2: 98% 100% 100% 100%  Weight:        Intake/Output Summary (Last 24 hours) at 04/08/2018 0755 Last data filed at 04/08/2018 0236 Gross per 24 hour  Intake 6900 ml  Output -  Net 6900 ml   Weight change:  Exam:   General:  Pt is alert,  follows commands appropriately, not in acute distress  HEENT: No icterus, No thrush, No neck mass, [place]/AT  Cardiovascular: RRR, S1/S2, no rubs, no gallops  Respiratory:  Diminished breath sounds at the bases bilateral scattered rales.  No wheezing.  Good air movement.  Abdomen: Soft/+BS, non tender, non distended, no guarding  Extremities: Trace lower extremity edema, No lymphangitis, No petechiae, No rashes, no synovitis   Data Reviewed: I have personally reviewed following labs and imaging studies Basic Metabolic Panel: Recent Labs  Lab 04/08/18 0007 04/08/18 0013 04/08/18 0431  NA 143 145 144  K 3.9 4.0 3.5  CL 108 108 117*  CO2 24  --  20*  GLUCOSE 127* 124* 154*  BUN 58* 48* 50*  CREATININE 2.21* 2.30* 1.86*  CALCIUM 8.5*  --  7.2*   Liver Function Tests: Recent Labs  Lab 04/08/18 0007 04/08/18 0431  AST 71* 51*  ALT 39 30  ALKPHOS 85 66  BILITOT 0.7 0.7  PROT 7.0 5.6*  ALBUMIN 2.3* 1.9*   No results for input(s): LIPASE, AMYLASE in the last 168 hours. No results for input(s): AMMONIA in the last 168 hours. Coagulation Profile: Recent Labs  Lab 04/08/18 0457  INR 2.93   CBC: Recent Labs  Lab 04/08/18 0007 04/08/18 0013 04/08/18 0431  WBC 9.6  --  15.3*  NEUTROABS 8.6*  --   --   HGB 11.9* 12.9 9.3*  HCT 40.4 38.0 32.1*  MCV 87.8  --  89.7  PLT 475*  --  401*   Cardiac Enzymes: No results for input(s): CKTOTAL, CKMB, CKMBINDEX, TROPONINI in the last 168 hours. BNP: Invalid input(s): POCBNP CBG: No results for input(s): GLUCAP in the last 168 hours. HbA1C: No results for input(s): HGBA1C in the last 72 hours. Urine analysis:    Component Value Date/Time   COLORURINE YELLOW 04/08/2018 0007   APPEARANCEUR CLOUDY (A) 04/08/2018 0007   LABSPEC 1.017 04/08/2018 0007   PHURINE 5.0 04/08/2018 0007   GLUCOSEU NEGATIVE 04/08/2018 0007   HGBUR NEGATIVE 04/08/2018 0007   BILIRUBINUR NEGATIVE 04/08/2018 0007   KETONESUR NEGATIVE 04/08/2018 0007   PROTEINUR NEGATIVE 04/08/2018 0007   UROBILINOGEN 0.2 07/28/2008 1156   NITRITE NEGATIVE 04/08/2018 0007   LEUKOCYTESUR TRACE (A) 04/08/2018 0007   Sepsis  Labs: @LABRCNTIP (procalcitonin:4,lacticidven:4) )No results found for this or any previous visit (from the past 240 hour(s)).   Scheduled Meds: . amiodarone  200 mg Oral Daily  . apixaban  5 mg Oral BID   Continuous Infusions: . sodium chloride Stopped (04/08/18 0159)  . sodium chloride 50 mL/hr at 04/08/18 0328  . ceFEPime (MAXIPIME) IV    . phenylephrine (NEO-SYNEPHRINE) Adult infusion 25 mcg/min (04/08/18 0749)  . [START ON 04/09/2018] vancomycin      Procedures/Studies: Dg Chest Port 1 View  Result Date: 04/08/2018 CLINICAL DATA:  Shortness of breath. EXAM: PORTABLE CHEST 1 VIEW COMPARISON:  Chest x-ray dated March 08, 2018. FINDINGS: Stable cardiomediastinal silhouette. Coarse interstitial and alveolar opacities in the left mid lung and both lung bases. No pleural effusion or pneumothorax. No acute osseous abnormality. IMPRESSION: 1. Coarse interstitial and alveolar opacities left mid lung and both lung bases could reflect pulmonary edema or pneumonia. Electronically Signed   By: Titus Dubin M.D.   On: 04/08/2018 00:55   Vas [place] Upper Extremity Venous Duplex  Result Date: 03/10/2018 UPPER VENOUS STUDY  Indications: Swelling Performing Technologist: Oliver Hum RVT  Examination Guidelines: A complete evaluation includes B-mode imaging, spectral Doppler, color Doppler, and power  Doppler as needed of all accessible portions of each vessel. Bilateral testing is considered an integral part of a complete examination. Limited examinations for reoccurring indications may be performed as noted.  Right Findings: +----------+------------+----------+---------+-----------+-------+ RIGHT     CompressiblePropertiesPhasicitySpontaneousSummary +----------+------------+----------+---------+-----------+-------+ Subclavian    Full                 Yes       Yes            +----------+------------+----------+---------+-----------+-------+  Left Findings:  +----------+------------+----------+---------+-----------+-----------------+ LEFT      CompressiblePropertiesPhasicitySpontaneous     Summary      +----------+------------+----------+---------+-----------+-----------------+ IJV           Full                 Yes       Yes                      +----------+------------+----------+---------+-----------+-----------------+ Subclavian    Full                 Yes       Yes                      +----------+------------+----------+---------+-----------+-----------------+ Axillary      Full                 Yes       Yes                      +----------+------------+----------+---------+-----------+-----------------+ Brachial      Full                 Yes       Yes                      +----------+------------+----------+---------+-----------+-----------------+ Radial        Full                                                    +----------+------------+----------+---------+-----------+-----------------+ Ulnar         Full                                                    +----------+------------+----------+---------+-----------+-----------------+ Cephalic    Partial                                 Age Indeterminate +----------+------------+----------+---------+-----------+-----------------+ Basilic       Full                                                    +----------+------------+----------+---------+-----------+-----------------+  Summary:  Right: No evidence of thrombosis in the subclavian.  Left: No evidence of deep vein thrombosis in the upper extremity. Findings consistent with age indeterminate superficial vein thrombosis involving the left cephalic vein.  *See table(s) above for measurements and observations.  Diagnosing physician: Deitra Mayo MD Electronically signed by Deitra Mayo MD  on 03/10/2018 at 3:48:38 PM.    Final     Orson Eva, DO  [facility] Pager  [phone]  If 7PM-7AM, please contact night-coverage [email or web address] Password [email or web address] 04/08/2018, 7:55 AM   LOS: 0 days

## 2018-04-08 NOTE — Consult Note (Signed)
Consultation Note Date: 04/08/2018   Patient Name: Brenda Hall  DOB: 28-Jan-1931  MRN: [id number]  Age / Sex: 84 y.o., female  PCP: [name], MD Referring Physician: Orson Eva, MD  Reason for Consultation: Establishing goals of care  HPI/Patient Profile: 84 y.o. female  with past medical history of CVA with left side hemiplegia, CKD III, admitted on 04/08/2018 with respiratory distress. Workup revealed septic shock- requiring IV pressors, Bipap (off pressors now and on nasal cannula)- likely related to pnuemonia (likely secondary to aspiration) and UTI. SLP eval showed patient with dysphagia and moderate aspiration risk. Palliative medicine consulted for [place].   Clinical Assessment and Goals of Care:  I have reviewed medical records including EPIC notes, labs and imaging, assessed the patient and then met at the bedside along with patient's HCPOA- her son, Brenda Hall- to discuss diagnosis prognosis, [place], EOL wishes, disposition and options.  I introduced Palliative Medicine as specialized medical care for people living with serious illness. It focuses on providing relief from the symptoms and stress of a serious illness. The goal is to improve quality of life for both the patient and the family.  We discussed a brief life review of the patient. She is an Training and development officer. Was always active and loved traveling.  As far as functional and nutritional status - she has had very steep decline since her CVA. Since her discharge she has not been able to ambulate, has been bedbound. Noted weight loss. She doesn't want to eat or drink. She is dependent for ADL's.   We discussed her current illness and what it means in the larger context of her on-going co-morbidities.  Natural disease trajectory and expectations at EOL were discussed.  I attempted to elicit values and goals of care important to the patient.  Patient  was always very active. Her comfort and dignity are very important to her.  The difference between aggressive medical intervention and comfort care was considered in light of the patient's goals of care.   Advanced directives, concepts specific to code status, artifical feeding and hydration, and rehospitalization were considered and discussed. We discussed that if patient were to undergo full resuscitation with CPR and it were to be unsuccessful she would be returned to a state of much less quality of life than she has now. Brenda Hall agreed and would like DNR status- meaning if she is found without pulse and without respirations, no CPR. However, to that point, continue limited medical interventions including medical treatment, Bipap while patient is hospitalzed. Upon discharge he would like her to be discharged to a LTC facility with Hospice services in place. Anticipating she is likely to decline with recurrence of aspiration pneumonia or other life threatening illness- and the preference at that point would be comfort measures only and allow for a natural, comfortable death with dignity.  Hospice and Palliative Care services outpatient were explained and offered.  Questions and concerns were addressed.  Hard Choices booklet left for review. The family was encouraged to call with questions or  concerns.    Primary Decision Maker HCPOA- Brenda Hall    SUMMARY OF RECOMMENDATIONS -DNR -Comfort feeding with aspiration precautions -Treat what is treatable -Stabilize patient and d/c to LTC setting with Hospice in place     Code Status/Advance Care Planning:  DNR  Palliative Prophylaxis:   Aspiration  Additional Recommendations (Limitations, Scope, Preferences):  No Artificial Feeding  Prognosis:    < 6 months d/t debilitating CVA, dysphagia with aspiration pnuemonia, plan to stabilize this admission then d/c to LTC with Hospice  Discharge Planning: [place] with  Hospice  Primary Diagnoses: Present on Admission: . Sepsis ([place]) . Sick sinus syndrome ([place]) . PAF (paroxysmal atrial fibrillation) ([place]) . Essential hypertension   I have reviewed the medical record, interviewed the patient and family, and examined the patient. The following aspects are pertinent.  Past Medical History:  Diagnosis Date  . Anemia   . CAD in native artery    a. NSTEMI 12/2005 s/p BMS to Cx, prior normal LVEF.  Marland Kitchen Cancer ([place]) 1988 and  2009   BCC nose and  Melanoma back  . Cancer of skin Jan. 2014   [facility] Forehead and Nose  . Carotid stenosis    mild  . Chronic back pain   . CKD (chronic kidney disease), stage III ([place])   . Coronary atherosclerosis of unspecified type of vessel, native or graft   . DJD (degenerative joint disease) of knee   . History of TIA (transient ischemic attack)   . HTN (hypertension)   . IBS (irritable bowel syndrome)   . Myocardial infarction [facility])    5 yrs ago     dr wall  cardiac  . Osteoporosis   . Other diseases of lung, not elsewhere classified    lung nodule  . Polymyalgia rheumatica ([place])   . Shingles 01/2016-02/2016  . Stroke [facility])    Social History   Socioeconomic History  . Marital status: Widowed    Spouse name: Not on file  . Number of children: Not on file  . Years of education: Not on file  . Highest education level: Not on file  Occupational History  . Not on file  Social Needs  . Financial resource strain: Not on file  . Food insecurity:    Worry: Not on file    Inability: Not on file  . Transportation needs:    Medical: Not on file    Non-medical: Not on file  Tobacco Use  . Smoking status: Never Smoker  . Smokeless tobacco: Never Used  . Tobacco comment: tobacco use - no  Substance and Sexual Activity  . Alcohol use: Yes    Alcohol/week: 0.0 standard drinks    Comment: occasionally a glass of wine or margarita per pt  . Drug use: No  . Sexual activity: Not on file  Lifestyle  . Physical  activity:    Days per week: Not on file    Minutes per session: Not on file  . Stress: Not on file  Relationships  . Social connections:    Talks on phone: Not on file    Gets together: Not on file    Attends religious service: Not on file    Active member of club or organization: Not on file    Attends meetings of clubs or organizations: Not on file    Relationship status: Not on file  Other Topics Concern  . Not on file  Social History Narrative   Married, retired. 3 children.  Regular exercise -yes; hobbies = swimming.   Family History  Problem Relation Age of Onset  . Heart disease Father   . Hypertension Father   . Heart attack Father   . Cancer Mother        bladder   . Hypertension Daughter   . Atrial fibrillation Daughter   . Hypertension Son   . Colon cancer Neg Hx    Scheduled Meds: . amiodarone  200 mg Oral Daily  . mouth rinse  15 mL Mouth Rinse BID   Continuous Infusions: . sodium chloride Stopped (04/08/18 0159)  . sodium chloride 50 mL/hr at 04/08/18 0328  . phenylephrine (NEO-SYNEPHRINE) Adult infusion Stopped (04/08/18 0756)  . piperacillin-tazobactam (ZOSYN)  IV 3.375 g (04/08/18 1404)  . [START ON 04/09/2018] vancomycin     PRN Meds:.acetaminophen **OR** acetaminophen, ondansetron **OR** ondansetron (ZOFRAN) IV, RESOURCE THICKENUP CLEAR Medications Prior to Admission:  Prior to Admission medications   Medication Sig Start Date End Date Taking? Authorizing Provider  Abaloparatide (TYMLOS) 3120 MCG/1.56ML SOPN Inject 80 mcg into the skin daily. 12/05/17   Bo Merino, MD  acetaminophen (TYLENOL) 500 MG tablet Take 500 mg by mouth every 6 (six) hours as needed for mild pain.    [provider]  amiodarone (PACERONE) 200 MG tablet Take 200 mg by mouth daily.    [provider]  amLODipine (NORVASC) 5 MG tablet Take 5 mg by mouth daily.    [provider]  apixaban (ELIQUIS) 5 MG TABS tablet Take 1 tablet (5 mg total) by  mouth 2 (two) times daily. 03/13/18   Nita Sells, MD  atorvastatin (LIPITOR) 80 MG tablet Take 1 tablet (80 mg total) by mouth at bedtime. 01/27/18   [name], MD  Calcium Carbonate-Vit D-Min (CALCIUM 1200 PO) Take 1 tablet by mouth at bedtime.     [provider]  estrogens, conjugated, (PREMARIN) 0.3 MG tablet Take 1 tablet (0.3 mg total) by mouth every evening. 02/14/17   Johnson, Clanford L, MD  fluticasone (FLONASE) 50 MCG/ACT nasal spray Place 2 sprays into both nostrils daily.    [provider]  gabapentin (NEURONTIN) 100 MG capsule Take 100 mg by mouth 3 (three) times daily.    [provider]  hydrocortisone cream 1 % Apply sparingly to rectal once a day prn    [provider]  loperamide (IMODIUM) 2 MG capsule Take 2 mg by mouth daily as needed for diarrhea or loose stools.     [provider]  Maltodextrin-Xanthan Gum (RESOURCE THICKENUP CLEAR) POWD Take 120 g by mouth as needed (nectar thick liquids). 03/13/18   Nita Sells, MD  metoprolol tartrate (LOPRESSOR) 100 MG tablet Take 1 tablet (100 mg total) by mouth 2 (two) times daily. 03/13/18   Nita Sells, MD  nitroGLYCERIN (NITROSTAT) 0.4 MG SL tablet Place 1 tablet (0.4 mg total) under the tongue every 5 (five) minutes as needed for chest pain. 12/03/16   [name], MD  pantoprazole (PROTONIX) 40 MG tablet Take 40 mg by mouth daily.    [provider]  sertraline (ZOLOFT) 50 MG tablet Take 50 mg by mouth daily.    [provider]  traMADol (ULTRAM) 50 MG tablet Take 1 tablet (50 mg total) by mouth every 4 (four) hours as needed. 03/24/18   Virgie Dad, MD  Zinc 50 MG CAPS Take 50 mg by mouth daily.    [provider]   Allergies  Allergen Reactions  . Epinephrine  Other (See Comments)    During dental procedure-caused facial swelling at injection site. Pt has had epinephrine since then for nose and facial surgery and  did fine with it per pt.  . Clopidogrel Bisulfate Itching and Rash    Plavix  . Oxycodone     Hallucinations. Pt states that she can tolerate hydrocodone   Review of Systems  Constitutional: Positive for activity change and appetite change.  Psychiatric/Behavioral: Positive for decreased concentration.    Physical Exam Vitals signs and nursing note reviewed.  Constitutional:      Comments: frail  Cardiovascular:     Rate and Rhythm: Regular rhythm.  Pulmonary:     Effort: Pulmonary effort is normal.  Musculoskeletal:     Comments: LUE edema, L hemiplegia  Neurological:     Comments: Easily distracted     Vital Signs: BP (!) 150/55   Pulse 67   Temp (!) 97.4 F (36.3 C) (Axillary)   Resp (!) 29   Ht 5' 4"  (1.626 m)   Wt 66.8 kg   SpO2 94%   BMI 25.28 kg/m  Pain Scale: Faces   Pain Score: Asleep   SpO2: SpO2: 94 % O2 Device:SpO2: 94 % O2 Flow Rate: .O2 Flow Rate (L/min): 3 L/min  IO: Intake/output summary:   Intake/Output Summary (Last 24 hours) at 04/08/2018 1645 Last data filed at 04/08/2018 1544 Gross per 24 hour  Intake 7970.26 ml  Output -  Net 7970.26 ml    LBM: Last BM Date: 04/08/18 Baseline Weight: Weight: 61.8 kg Most recent weight: Weight: 66.8 kg     Palliative Assessment/Data:PPS: 30%     Thank you for this consult. Palliative medicine will continue to follow and assist as needed.   Time In: 1300 Time Out: 1500 Time Total: 120 minutes Greater than 50%  of this time was spent counseling and coordinating care related to the above assessment and plan.  Signed by: Mariana Kaufman, AGNP-C Palliative Medicine    Please contact Palliative Medicine Team phone at [phone] for questions and concerns.  For individual provider: See Shea Evans

## 2018-04-08 NOTE — Clinical Social Work Note (Signed)
Clinical Social Work Assessment  Patient Details  Name: Brenda Hall MRN: [id number] Date of Birth: 10/30/1930  Date of referral:  04/08/18               Reason for consult:  Other (Comment Required)(From [facility] )                Permission sought to share information with:    Permission granted to share information::     Name::        Agency::     Relationship::     Contact Information:     Housing/Transportation Living arrangements for the past 2 months:  [place], [place] of Information:  Facility Patient Interpreter Needed:  None Criminal Activity/Legal Involvement Pertinent to Current Situation/Hospitalization:    Significant Relationships:  Adult Children Lives with:  Facility Resident Do you feel safe going back to the place where you live?  Yes Need for family participation in patient care:  Yes (Comment)  Care giving concerns:  Facility resident.    Social Worker assessment / plan:  Kerri at [facility] provided the history. Patient has been in the facility for the past 26 days. She is receiving PT, OT and ST after having a significant stroke. Patient has not been improving in therapy and is currently total assistance. She is in restorative dining.  She can return to the facility at discharge.  Lcsw will follow up with family.    Employment status:  Retired Forensic scientist:  Medicare PT Recommendations:  Not assessed at this time Information / Referral to community resources:     Patient/Family's Response to care:  LCSW will follow up with family.  Patient/Family's Understanding of and Emotional Response to Diagnosis, Current Treatment, and Prognosis:  LCSW will follow up with family.   Emotional Assessment Appearance:  Appears stated age Attitude/Demeanor/Rapport:    Affect (typically observed):  Unable to Assess Orientation:    Alcohol / Substance use:    Psych involvement (Current and /or in the community):     Discharge  Needs  Concerns to be addressed:  Discharge Planning Concerns Readmission within the last 30 days:  No Current discharge risk:  None Barriers to Discharge:  No Barriers Identified   Ihor Gully, LCSW 04/08/2018, 4:14 PM

## 2018-04-08 NOTE — Progress Notes (Signed)
Asked to see patient because of heme positive stool. Patient with multiple medical problems including recent right CVA chronic kidney disease coronary artery disease atrial fibrillation who was admitted with respiratory distress early this morning and felt to be septic.  She is suspected to have aspiration pneumonia.  She is on cefepime and vancomycin. Patient drowsy and unable to provide any history at this time. According to nursing staff she passed small amount of yellowish stool.  No melena or rectal bleeding reported. Since admission hemoglobin has dropped by more than 2 g since admission. Patient's apixaban is on hold. Patient is at risk for peptic ulcer disease and would benefit from prophylaxis. We will start patient on famotidine 20 mg p.o. twice daily. Full note to follow.

## 2018-04-08 NOTE — H&P (Signed)
TRH H&P    Patient Demographics:    Brenda Hall, is a 84 y.o. female  MRN: [id number]  DOB - [birth year]  Admit Date - 04/08/2018  Referring MD/NP/PA: Dr. Melanee Left  Outpatient Primary MD for the patient is Wolfgang Phoenix, Grace Bushy, MD  Patient coming from: Skilled facility  Chief complaint-shortness of breath   HPI:    Brenda Hall  is a 84 y.o. female, with history of CVA with left hemiparesis, atrial fibrillation on anticoagulation with Eliquis, CKD stage III, polymyalgia rheumatica, essential hypertension, CAD who is currently residing at [facility] for rehab, after she was discharged from [facility] last month. Today patient's daughter noted that she was having respiratory distress and had a lot of secretions in the lungs which she was unable to cough up. EMS was called and noted decreased mental status was initial O2 sats of 60%.  She was put on CPAP with O2 sat saturating 80%.  Currently she is on BiPAP Patient was hypotensive and hypoxic. Given IV fluid boluses and oxygen saturation improved on BiPAP. Patient denies any pain at this time. Breathing is improved on BiPAP There is been no reported history of nausea vomiting or diarrhea as per family members She has been complaining of intermittent abdominal pain. Patient is bedbound after stroke due to left hemiparesis, she is on soft diet and also has some dysphagia with intermittent choking episodes as per family.  Chest x-ray in the ED showed coarse interstitial and alveolar opacities left midlung and both lung bases which could reflect pulmonary edema or pneumonia. Patient started on IV vancomycin and cefepime    Review of systems:    In addition to the HPI above,    All other systems reviewed and are negative.    Past History of the following :    Past Medical History:  Diagnosis Date  . Anemia   . CAD in native artery    a. NSTEMI  12/2005 s/p BMS to Cx, prior normal LVEF.  Marland Kitchen Cancer ([place]) 1988 and  2009   BCC nose and  Melanoma back  . Cancer of skin Jan. 2014   [facility]  . Carotid stenosis    mild  . Chronic back pain   . CKD (chronic kidney disease), stage III ([place])   . Coronary atherosclerosis of unspecified type of vessel, native or graft   . DJD (degenerative joint disease) of knee   . History of TIA (transient ischemic attack)   . HTN (hypertension)   . IBS (irritable bowel syndrome)   . Myocardial infarction [facility])    5 yrs ago     dr wall  cardiac  . Osteoporosis   . Other diseases of lung, not elsewhere classified    lung nodule  . Polymyalgia rheumatica ([place])   . Shingles 01/2016-02/2016  . Stroke [facility])       Past Surgical History:  Procedure Laterality Date  . ABDOMINAL HYSTERECTOMY  1977   partial  . BACK SURGERY  1982  . CARDIAC CATHETERIZATION    .  COLONOSCOPY N/A 01/27/2013   Procedure: COLONOSCOPY;  Surgeon: Rogene Houston, MD;  Location: [facility];  Service: Endoscopy;  Laterality: N/A;  1030  . EYE SURGERY Bilateral 04/2017  . EYE SURGERY Right 09/23/2017  . IR KYPHO LUMBAR INC FX REDUCE BONE BX UNI/BIL CANNULATION INC/IMAGING  09/30/2017  . IR VERTEBROPLASTY CERV/THOR BX INC UNI/BIL INC/INJECT/IMAGING  09/05/2017  . JOINT REPLACEMENT  2001 and  2011   6 knee surgeries   bil replacement  . KNEE ARTHROPLASTY    . KNEE ARTHROSCOPY    . LUMBAR LAMINECTOMY    . PR VEIN BYPASS GRAFT,AORTO-FEM-POP  2010  . removal of melanoma from back  2007  . SPINE SURGERY  2013   Broken back: fusion surgery  . tonisllectomy  1944  . total knee anthroplasty    . VERTEBROPLASTY  07/11/2011   Procedure: VERTEBROPLASTY;  Surgeon: Faythe Ghee, MD;  Location: [facility]URO ORS;  Service: Neurosurgery;  Laterality: N/A;  Vertebroplasty of Lumbar Two      Social History:      Social History   Tobacco Use  . Smoking status: Never Smoker  . Smokeless tobacco: Never Used  . Tobacco  comment: tobacco use - no  Substance Use Topics  . Alcohol use: Yes    Alcohol/week: 0.0 standard drinks    Comment: occasionally a glass of wine or margarita per pt       Family History :     Family History  Problem Relation Age of Onset  . Heart disease Father   . Hypertension Father   . Heart attack Father   . Cancer Mother        bladder   . Hypertension Daughter   . Atrial fibrillation Daughter   . Hypertension Son   . Colon cancer Neg Hx       Home Medications:   Prior to Admission medications   Medication Sig Start Date End Date Taking? Authorizing Provider  Abaloparatide (TYMLOS) 3120 MCG/1.56ML SOPN Inject 80 mcg into the skin daily. 12/05/17   Bo Merino, MD  acetaminophen (TYLENOL) 500 MG tablet Take 500 mg by mouth every 6 (six) hours as needed for mild pain.    [provider]  amiodarone (PACERONE) 200 MG tablet Take 200 mg by mouth daily.    [provider]  amLODipine (NORVASC) 5 MG tablet Take 5 mg by mouth daily.    [provider]  apixaban (ELIQUIS) 5 MG TABS tablet Take 1 tablet (5 mg total) by mouth 2 (two) times daily. 03/13/18   Nita Sells, MD  atorvastatin (LIPITOR) 80 MG tablet Take 1 tablet (80 mg total) by mouth at bedtime. 01/27/18   [name], MD  Calcium Carbonate-Vit D-Min (CALCIUM 1200 PO) Take 1 tablet by mouth at bedtime.     [provider]  estrogens, conjugated, (PREMARIN) 0.3 MG tablet Take 1 tablet (0.3 mg total) by mouth every evening. 02/14/17   Johnson, Clanford L, MD  fluticasone (FLONASE) 50 MCG/ACT nasal spray Place 2 sprays into both nostrils daily.    [provider]  gabapentin (NEURONTIN) 100 MG capsule Take 100 mg by mouth 3 (three) times daily.    [provider]  hydrocortisone cream 1 % Apply sparingly to rectal once a day prn    [provider]  loperamide (IMODIUM) 2 MG capsule Take 2 mg by mouth daily as needed for diarrhea or loose  stools.     [provider]  Maltodextrin-Xanthan Gum (RESOURCE THICKENUP CLEAR) POWD Take 120 g by mouth as needed (nectar thick liquids). 03/13/18   Nita Sells, MD  metoprolol tartrate (LOPRESSOR) 100 MG tablet Take 1 tablet (100 mg total) by mouth 2 (two) times daily. 03/13/18   Nita Sells, MD  nitroGLYCERIN (NITROSTAT) 0.4 MG SL tablet Place 1 tablet (0.4 mg total) under the tongue every 5 (five) minutes as needed for chest pain. 12/03/16   [name], MD  pantoprazole (PROTONIX) 40 MG tablet Take 40 mg by mouth daily.    [provider]  sertraline (ZOLOFT) 50 MG tablet Take 50 mg by mouth daily.    [provider]  traMADol (ULTRAM) 50 MG tablet Take 1 tablet (50 mg total) by mouth every 4 (four) hours as needed. 03/24/18   Virgie Dad, MD  Zinc 50 MG CAPS Take 50 mg by mouth daily.    [provider]     Allergies:     Allergies  Allergen Reactions  . Epinephrine Other (See Comments)    During dental procedure-caused facial swelling at injection site. Pt has had epinephrine since then for nose and facial surgery and did fine with it per pt.  . Clopidogrel Bisulfate Itching and Rash    Plavix  . Oxycodone     Hallucinations. Pt states that she can tolerate hydrocodone     Physical Exam:   Vitals  Blood pressure (!) 111/42, pulse 64, temperature 99.3 F (37.4 C), resp. rate (!) 23, weight 61.8 kg, SpO2 100 %.  1.  General: Patient on BiPAP  2. Psychiatric: Awake, alert, lacks judgment to make decision at this time  3. Neurologic: Left hemiparesis  4. Eyes :  anicteric sclerae, moist conjunctivae with no lid lag.  5. ENMT:  Oropharynx clear with moist mucous membranes and good dentition  6. Neck:  supple, no cervical lymphadenopathy appriciated, No thyromegaly  7. Respiratory : Normal respiratory effort, good air movement bilaterally, no wheezing or rhonchi auscultated  8. Cardiovascular : RRR, no  gallops, rubs or murmurs, no leg edema  9. Gastrointestinal:  Positive bowel sounds, abdomen soft, non-tender to palpation,no hepatosplenomegaly, no rigidity or guarding       10. Skin:  No cyanosis, normal texture and turgor, no rash, lesions or ulcers     Data Review:    CBC Recent Labs  Lab 04/08/18 0007 04/08/18 0013  WBC 9.6  --   HGB 11.9* 12.9  HCT 40.4 38.0  PLT 475*  --   MCV 87.8  --   MCH 25.9*  --   MCHC 29.5*  --   RDW 16.4*  --   LYMPHSABS 0.6*  --   MONOABS 0.4  --   EOSABS 0.0  --   BASOSABS 0.1  --    ------------------------------------------------------------------------------------------------------------------  Results for orders placed or performed during the hospital encounter of 04/08/18 (from the past 48 hour(s))  Comprehensive metabolic panel     Status: Abnormal   Collection Time: 04/08/18 12:07 AM  Result Value Ref Range   Sodium 143 135 - 145 mmol/L   Potassium 3.9 3.5 - 5.1 mmol/L   Chloride 108 98 - 111 mmol/L   CO2 24 22 - 32 mmol/L   Glucose, Bld 127 (H) 70 - 99 mg/dL   BUN 58 (H) 8 - 23 mg/dL   Creatinine, Ser 2.21 (H) 0.44 - 1.00 mg/dL   Calcium 8.5 (L) 8.9 - 10.3 mg/dL   Total Protein 7.0 6.5 - 8.1  g/dL   Albumin 2.3 (L) 3.5 - 5.0 g/dL   AST 71 (H) 15 - 41 U/L   ALT 39 0 - 44 U/L   Alkaline Phosphatase 85 38 - 126 U/L   Total Bilirubin 0.7 0.3 - 1.2 mg/dL   GFR calc non Af Amer 20 (L) >60 mL/min   GFR calc Af Amer 23 (L) >60 mL/min   Anion gap 11 5 - 15    Comment: Performed at [facility], [address], [place], [place] [address]  CBC WITH DIFFERENTIAL     Status: Abnormal   Collection Time: 04/08/18 12:07 AM  Result Value Ref Range   WBC 9.6 4.0 - 10.5 K/uL    Comment: WHITE COUNT CONFIRMED ON SMEAR   RBC 4.60 3.87 - 5.11 MIL/uL   Hemoglobin 11.9 (L) 12.0 - 15.0 g/dL   HCT 40.4 36.0 - 46.0 %   MCV 87.8 80.0 - 100.0 fL   MCH 25.9 (L) 26.0 - 34.0 pg   MCHC 29.5 (L) 30.0 - 36.0 g/dL   RDW 16.4 (H) 11.5 - 15.5 %    Platelets 475 (H) 150 - 400 K/uL   nRBC 0.0 0.0 - 0.2 %   Neutrophils Relative % 88 %   Neutro Abs 8.6 (H) 1.7 - 7.7 K/uL   Lymphocytes Relative 6 %   Lymphs Abs 0.6 (L) 0.7 - 4.0 K/uL   Monocytes Relative 4 %   Monocytes Absolute 0.4 0.1 - 1.0 K/uL   Eosinophils Relative 0 %   Eosinophils Absolute 0.0 0.0 - 0.5 K/uL   Basophils Relative 1 %   Basophils Absolute 0.1 0.0 - 0.1 K/uL   Immature Granulocytes 1 %   Abs Immature Granulocytes 0.06 0.00 - 0.07 K/uL    Comment: Performed at [facility], [address] [place], [place] [address]  Urinalysis, Routine w reflex microscopic     Status: Abnormal   Collection Time: 04/08/18 12:07 AM  Result Value Ref Range   Color, Urine YELLOW YELLOW   APPearance CLOUDY (A) CLEAR   Specific Gravity, Urine 1.017 1.005 - 1.030   pH 5.0 5.0 - 8.0   Glucose, UA NEGATIVE NEGATIVE mg/dL   Hgb urine dipstick NEGATIVE NEGATIVE   Bilirubin Urine NEGATIVE NEGATIVE   Ketones, ur NEGATIVE NEGATIVE mg/dL   Protein, ur NEGATIVE NEGATIVE mg/dL   Nitrite NEGATIVE NEGATIVE   Leukocytes, UA TRACE (A) NEGATIVE   RBC / HPF 6-10 0 - 5 RBC/hpf   WBC, UA 21-50 0 - 5 WBC/hpf   Bacteria, UA MANY (A) NONE SEEN   Squamous Epithelial / LPF 6-10 0 - 5   WBC Clumps PRESENT    Mucus PRESENT    Budding Yeast PRESENT    Hyaline Casts, UA PRESENT     Comment: Performed at [facility], [address] [place], [place] [address]  I-Stat CG4 Lactic Acid, ED     Status: Abnormal   Collection Time: 04/08/18 12:13 AM  Result Value Ref Range   Lactic Acid, Venous 2.79 (HH) 0.5 - 1.9 mmol/L   Comment NOTIFIED PHYSICIAN   I-stat chem 8, ed     Status: Abnormal   Collection Time: 04/08/18 12:13 AM  Result Value Ref Range   Sodium 145 135 - 145 mmol/L   Potassium 4.0 3.5 - 5.1 mmol/L   Chloride 108 98 - 111 mmol/L   BUN 48 (H) 8 - 23 mg/dL   Creatinine, Ser 2.30 (H) 0.44 - 1.00 mg/dL   Glucose, Bld  124 (H) 70 - 99 mg/dL   Calcium, Ion 1.12 (L) 1.15 - 1.40 mmol/L   TCO2 25  22 - 32 mmol/L   Hemoglobin 12.9 12.0 - 15.0 g/dL   HCT 38.0 36.0 - 46.0 %  Blood gas, arterial ([facility], [facility], [facility])     Status: Abnormal   Collection Time: 04/08/18 12:14 AM  Result Value Ref Range   FIO2 100.00    Delivery systems BILEVEL POSITIVE AIRWAY PRESSURE    LHR 10 resp/min   Inspiratory PAP 14    Expiratory PAP 6    pH, Arterial 7.444 7.350 - 7.450   pCO2 arterial 37.5 32.0 - 48.0 mmHg   pO2, Arterial 58.7 (L) 83.0 - 108.0 mmHg   Bicarbonate 25.8 20.0 - 28.0 mmol/L   Acid-Base Excess 1.6 0.0 - 2.0 mmol/L   O2 Saturation 89.4 %   Collection site LEFT RADIAL    Drawn by [id number]    Sample type ARTERIAL DRAW    Allens test (pass/fail) PASS PASS    Comment: Performed at [facility], [address] [place], [place] [address]  I-stat troponin, ED (not at [facility], [facility])     Status: None   Collection Time: 04/08/18 12:15 AM  Result Value Ref Range   Troponin i, poc 0.05 0.00 - 0.08 ng/mL   Comment 3            Comment: Due to the release kinetics of cTnI, a negative result within the first hours of the onset of symptoms does not rule out myocardial infarction with certainty. If myocardial infarction is still suspected, repeat the test at appropriate intervals.     Chemistries  Recent Labs  Lab 04/08/18 0007 04/08/18 0013  NA 143 145  K 3.9 4.0  CL 108 108  CO2 24  --   GLUCOSE 127* 124*  BUN 58* 48*  CREATININE 2.21* 2.30*  CALCIUM 8.5*  --   AST 71*  --   ALT 39  --   ALKPHOS 85  --   BILITOT 0.7  --    ------------------------------------------------------------------------------------------------------------------  ------------------------------------------------------------------------------------------------------------------ GFR: Estimated Creatinine Clearance: 16.1 mL/min (A) (by C-G formula based on SCr of 2.3 mg/dL (H)). Liver Function Tests: Recent Labs  Lab 04/08/18 0007  AST 71*  ALT 39  ALKPHOS 85  BILITOT 0.7  PROT 7.0  ALBUMIN 2.3*     --------------------------------------------------------------------------------------------------------------- Urine analysis:    Component Value Date/Time   COLORURINE YELLOW 04/08/2018 0007   APPEARANCEUR CLOUDY (A) 04/08/2018 0007   LABSPEC 1.017 04/08/2018 0007   PHURINE 5.0 04/08/2018 0007   GLUCOSEU NEGATIVE 04/08/2018 0007   HGBUR NEGATIVE 04/08/2018 0007   BILIRUBINUR NEGATIVE 04/08/2018 0007   KETONESUR NEGATIVE 04/08/2018 0007   PROTEINUR NEGATIVE 04/08/2018 0007   UROBILINOGEN 0.2 07/28/2008 1156   NITRITE NEGATIVE 04/08/2018 0007   LEUKOCYTESUR TRACE (A) 04/08/2018 0007      Imaging Results:    Dg Chest Port 1 View  Result Date: 04/08/2018 CLINICAL DATA:  Shortness of breath. EXAM: PORTABLE CHEST 1 VIEW COMPARISON:  Chest x-ray dated March 08, 2018. FINDINGS: Stable cardiomediastinal silhouette. Coarse interstitial and alveolar opacities in the left mid lung and both lung bases. No pleural effusion or pneumothorax. No acute osseous abnormality. IMPRESSION: 1. Coarse interstitial and alveolar opacities left mid lung and both lung bases could reflect pulmonary edema or pneumonia. Electronically Signed   By: Titus Dubin M.D.   On: 04/08/2018 00:55    My personal review of EKG: Rhythm NSR  Assessment & Plan:    Active Problems:   Sepsis ([place])   1. Acute hypoxic respiratory failure-multifactorial, patient likely has underlying aspiration pneumonia, as seen on chest x-ray also possibility of pulmonary edema.  She does have grade 1 diastolic dysfunction as per echo from November 2019,, pulmonary hypertension with peak pressure 60 mmHg.  Currently on BiPAP.  Also started on vancomycin and cefepime for pneumonia.  Blood cultures x2 have been obtained.  Will follow blood culture results.  2. Sepsis/septic shock-due to pneumonia, also possibility of UTI.  She has ab normal UA.  Follow culture results.  Continue vancomycin and cefepime.  Started on Neo-Synephrine  for vasopressor support, will try to wean off for next few hours.  She did receive IV fluid bolus in the ED.  We will start gentle IV hydration with normal saline at 50 mL/h at this time due to underlying diastolic heart failure.  3. Paroxysmal atrial fibrillation-currently she is in normal sinus rhythm.  Will hold metoprolol and Imdur as patient is hypotensive.  Start Eliquis from tomorrow morning.  If patient still on BiPAP consider starting on IV heparin.  4. Acute kidney injury on CKD stage III-Baseline creatinine around 1.5, today creatinine is 2.21.  Started on gentle IV hydration, will closely watch I's and O's as patient has diastolic heart failure.     DVT Prophylaxis-   apixaban  AM Labs Ordered, also please review Full Orders  Family Communication: Admission, patients condition and plan of care including tests being ordered have been discussed with the patient's family at bedside* who indicate understanding and agree with the plan and Code Status.  Code Status: Full code  Admission status: Inpatient: Based on patients clinical presentation and evaluation of above clinical data, I have made determination that patient meets Inpatient criteria at this time.  Patient will require more than 2 midnight stay in the hospital.  Time spent in minutes : 60 minutes   Oswald Hillock M.D on 04/08/2018 at 2:24 AM  Between 7am to 7pm - Pager - [phone]. After 7pm go to [email or web address] - password [facility]   [facility] - Office  [phone]

## 2018-04-08 NOTE — Evaluation (Signed)
Clinical/Bedside Swallow Evaluation Patient Details  Name: Brenda Hall MRN: [id number] Date of Birth: 1931-03-24  Today's Date: 04/08/2018 Time: SLP Start Time (ACUTE ONLY): 1340 SLP Stop Time (ACUTE ONLY): 1407 SLP Time Calculation (min) (ACUTE ONLY): 27 min  Past Medical History:  Past Medical History:  Diagnosis Date  . Anemia   . CAD in native artery    a. NSTEMI 12/2005 s/p BMS to Cx, prior normal LVEF.  Marland Kitchen Cancer ([place]) 1988 and  2009   BCC nose and  Melanoma back  . Cancer of skin Jan. 2014   [facility] Forehead and Nose  . Carotid stenosis    mild  . Chronic back pain   . CKD (chronic kidney disease), stage III ([place])   . Coronary atherosclerosis of unspecified type of vessel, native or graft   . DJD (degenerative joint disease) of knee   . History of TIA (transient ischemic attack)   . HTN (hypertension)   . IBS (irritable bowel syndrome)   . Myocardial infarction [facility])    5 yrs ago     dr wall  cardiac  . Osteoporosis   . Other diseases of lung, not elsewhere classified    lung nodule  . Polymyalgia rheumatica ([place])   . Shingles 01/2016-02/2016  . Stroke [facility])    Past Surgical History:  Past Surgical History:  Procedure Laterality Date  . ABDOMINAL HYSTERECTOMY  1977   partial  . BACK SURGERY  1982  . CARDIAC CATHETERIZATION    . COLONOSCOPY N/A 01/27/2013   Procedure: COLONOSCOPY;  Surgeon: Rogene Houston, MD;  [facility];  Service: Endoscopy;  Laterality: N/A;  1030  . EYE SURGERY Bilateral 04/2017  . EYE SURGERY Right 09/23/2017  . IR KYPHO LUMBAR INC FX REDUCE BONE BX UNI/BIL CANNULATION INC/IMAGING  09/30/2017  . IR VERTEBROPLASTY CERV/THOR BX INC UNI/BIL INC/INJECT/IMAGING  09/05/2017  . JOINT REPLACEMENT  2001 and  2011   6 knee surgeries   bil replacement  . KNEE ARTHROPLASTY    . KNEE ARTHROSCOPY    . LUMBAR LAMINECTOMY    . PR VEIN BYPASS GRAFT,AORTO-FEM-POP  2010  . removal of melanoma from back  2007  . SPINE SURGERY  2013    Broken back: fusion surgery  . tonisllectomy  1944  . total knee anthroplasty    . VERTEBROPLASTY  07/11/2011   Procedure: VERTEBROPLASTY;  Surgeon: Faythe Ghee, MD;  Location: [place] NEURO ORS;  Service: Neurosurgery;  Laterality: N/A;  Vertebroplasty of Lumbar Two   HPI:  84 year old female with a history of coronary disease, CKD stage III, recent right MCA stroke, sick sinus syndrome, proximal atrial fibrillation presenting with respiratory distress from her nursing facility.  Apparently, the patient began developing shortness of breath approximately 1 hour prior to arrival to the emergency department.  EMS reported decreased mental status with oxygen saturations in the 60% range.  The patient arrived in the emergency department on CPAP with saturations 80%.  The patient was recently discharged from the hospital after stay from 03/02/2018 through 03/13/2018 for treatment of right MCA stroke as well as paroxysmal atrial fibrillation.  The patient has been bedbound with left hemiparesis since discharge from the hospital.  Work-up in the emergency department revealed sepsis likely secondary to UTI and pneumonia.  The patient was started on vancomycin and cefepime.  The patient was placed on BiPAP initially and started on Neo-Synephrine for hypotension. BSE requested.   Assessment / Plan / Recommendation Clinical Impression  Clinical swallow evaluation completed at bedside. Pt apparently may have been left alone eating Jello yesterday and it is suspected that she choked on it (per Pt and family report). Pt with some gagging and expectoration of small amount of thick secretions prior to po. Pt with variable coughing after ice chips and NTL (dry) and mild oral residue with puree, however performance improved once family left room and there were fewer distractions. Will initiate D1/puree and NTL with plan for MBSS tomorrow. Above to RN. PO meds crushed as able in puree.  SLP Visit Diagnosis: Dysphagia,  oropharyngeal phase (R13.12)    Aspiration Risk  Moderate aspiration risk    Diet Recommendation Dysphagia 1 (Puree);Nectar-thick liquid   Liquid Administration via: Cup;Spoon Medication Administration: Crushed with puree Supervision: Full supervision/cueing for compensatory strategies Compensations: Slow rate;Small sips/bites(limit distractions and cue Pt to refrain from speaking ) Postural Changes: Seated upright at 90 degrees;Remain upright for at least 30 minutes after po intake    Other  Recommendations Recommended Consults: Consider GI evaluation Oral Care Recommendations: Oral care BID;Staff/trained caregiver to provide oral care Other Recommendations: Clarify dietary restrictions   Follow up Recommendations Skilled Nursing facility      Frequency and Duration min 2x/week  1 week       Prognosis Prognosis for Safe Diet Advancement: Fair Barriers to Reach Goals: Severity of deficits      Swallow Study   General Date of Onset: 04/08/18 HPI: 84 year old female with a history of coronary disease, CKD stage III, recent right MCA stroke, sick sinus syndrome, proximal atrial fibrillation presenting with respiratory distress from her nursing facility.  Apparently, the patient began developing shortness of breath approximately 1 hour prior to arrival to the emergency department.  EMS reported decreased mental status with oxygen saturations in the 60% range.  The patient arrived in the emergency department on CPAP with saturations 80%.  The patient was recently discharged from the hospital after stay from 03/02/2018 through 03/13/2018 for treatment of right MCA stroke as well as paroxysmal atrial fibrillation.  The patient has been bedbound with left hemiparesis since discharge from the hospital.  Work-up in the emergency department revealed sepsis likely secondary to UTI and pneumonia.  The patient was started on vancomycin and cefepime.  The patient was placed on BiPAP initially and  started on Neo-Synephrine for hypotension. BSE requested. Type of Study: Bedside Swallow Evaluation Previous Swallow Assessment: MBSS Nov 2019 D1 and NTL Diet Prior to this Study: NPO Temperature Spikes Noted: No Respiratory Status: Nasal cannula History of Recent Intubation: No Behavior/Cognition: Lethargic/Drowsy Oral Cavity Assessment: (lingual coating) Oral Care Completed by SLP: Yes Oral Cavity - Dentition: Adequate natural dentition Vision: Functional for self-feeding(assist) Self-Feeding Abilities: Total assist Patient Positioning: Upright in bed Baseline Vocal Quality: Normal Volitional Cough: Weak Volitional Swallow: Able to elicit    Oral/Motor/Sensory Function Overall Oral Motor/Sensory Function: Mild impairment(difficult to assess due to lethargy) Facial ROM: Reduced left   Ice Chips Ice chips: Impaired Presentation: Spoon Oral Phase Impairments: Poor awareness of bolus;Reduced labial seal Oral Phase Functional Implications: Oral holding Pharyngeal Phase Impairments: Suspected delayed Swallow   Thin Liquid Thin Liquid: Not tested    Nectar Thick Nectar Thick Liquid: Impaired Presentation: Cup;Spoon Oral Phase Impairments: Reduced labial seal;Reduced lingual movement/coordination Oral phase functional implications: Prolonged oral transit;Oral holding Pharyngeal Phase Impairments: Suspected delayed Swallow;Cough - Delayed   Honey Thick Honey Thick Liquid: Not tested   Puree Puree: Impaired Presentation: Spoon Oral Phase Impairments: Reduced lingual movement/coordination Oral Phase  Functional Implications: Oral residue Pharyngeal Phase Impairments: Suspected delayed Swallow   Solid     Solid: Not tested     Thank you,  Genene Churn, [place]  Sanyiah Kanzler 04/08/2018,2:11 PM

## 2018-04-08 NOTE — ED Triage Notes (Signed)
Pt comes from [facility] for SOB and decreased mental status. Pt O2 was 60% on RA. Pt arrived on CPAP O2 80%.

## 2018-04-09 ENCOUNTER — Inpatient Hospital Stay (HOSPITAL_COMMUNITY): Payer: Medicare Other

## 2018-04-09 DIAGNOSIS — R6521 Severe sepsis with septic shock: Secondary | ICD-10-CM

## 2018-04-09 DIAGNOSIS — Z515 Encounter for palliative care: Secondary | ICD-10-CM

## 2018-04-09 DIAGNOSIS — A419 Sepsis, unspecified organism: Secondary | ICD-10-CM

## 2018-04-09 DIAGNOSIS — R1310 Dysphagia, unspecified: Secondary | ICD-10-CM

## 2018-04-09 DIAGNOSIS — Z7189 Other specified counseling: Secondary | ICD-10-CM

## 2018-04-09 LAB — COMPREHENSIVE METABOLIC PANEL
ALT: 31 U/L (ref 0–44)
AST: 48 U/L — ABNORMAL HIGH (ref 15–41)
Albumin: 1.7 g/dL — ABNORMAL LOW (ref 3.5–5.0)
Alkaline Phosphatase: 71 U/L (ref 38–126)
Anion gap: 6 (ref 5–15)
BUN: 39 mg/dL — ABNORMAL HIGH (ref 8–23)
CO2: 21 mmol/L — ABNORMAL LOW (ref 22–32)
CREATININE: 1.29 mg/dL — AB (ref 0.44–1.00)
Calcium: 7.6 mg/dL — ABNORMAL LOW (ref 8.9–10.3)
Chloride: 121 mmol/L — ABNORMAL HIGH (ref 98–111)
GFR calc non Af Amer: 38 mL/min — ABNORMAL LOW (ref >60)
GFR, EST AFRICAN AMERICAN: 44 mL/min — AB (ref >60)
Glucose, Bld: 95 mg/dL (ref 70–99)
Potassium: 3.2 mmol/L — ABNORMAL LOW (ref 3.5–5.1)
Sodium: 148 mmol/L — ABNORMAL HIGH (ref 135–145)
Total Bilirubin: 0.5 mg/dL (ref 0.3–1.2)
Total Protein: 5.5 g/dL — ABNORMAL LOW (ref 6.5–8.1)

## 2018-04-09 LAB — CBC
HCT: 31.4 % — ABNORMAL LOW (ref 36.0–46.0)
Hemoglobin: 9.2 g/dL — ABNORMAL LOW (ref 12.0–15.0)
MCH: 26.4 pg (ref 26.0–34.0)
MCHC: 29.3 g/dL — ABNORMAL LOW (ref 30.0–36.0)
MCV: 90.2 fL (ref 80.0–100.0)
Platelets: 380 10*3/uL (ref 150–400)
RBC: 3.48 MIL/uL — ABNORMAL LOW (ref 3.87–5.11)
RDW: 16.5 % — AB (ref 11.5–15.5)
WBC: 13.2 10*3/uL — ABNORMAL HIGH (ref 4.0–10.5)
nRBC: 0 % (ref 0.0–0.2)

## 2018-04-09 LAB — PROCALCITONIN: Procalcitonin: 1.66 ng/mL

## 2018-04-09 LAB — MAGNESIUM: Magnesium: 1.5 mg/dL — ABNORMAL LOW (ref 1.7–2.4)

## 2018-04-09 MED ORDER — MAGNESIUM SULFATE 2 GM/50ML IV SOLN
2.0000 g | Freq: Once | INTRAVENOUS | Status: AC
  Administered 2018-04-09: 2 g via INTRAVENOUS
  Filled 2018-04-09: qty 50

## 2018-04-09 MED ORDER — DILTIAZEM HCL-DEXTROSE 100-5 MG/100ML-% IV SOLN (PREMIX)
5.0000 mg/h | INTRAVENOUS | Status: DC
  Administered 2018-04-09: 5 mg/h via INTRAVENOUS
  Administered 2018-04-09: 15 mg/h via INTRAVENOUS
  Administered 2018-04-10: 12.5 mg/h via INTRAVENOUS
  Administered 2018-04-10 (×2): 15 mg/h via INTRAVENOUS
  Filled 2018-04-09 (×7): qty 100

## 2018-04-09 MED ORDER — LACTATED RINGERS IV SOLN
INTRAVENOUS | Status: DC
  Administered 2018-04-09 – 2018-04-10 (×3): via INTRAVENOUS

## 2018-04-09 MED ORDER — METOPROLOL TARTRATE 50 MG PO TABS
50.0000 mg | ORAL_TABLET | Freq: Two times a day (BID) | ORAL | Status: DC
  Administered 2018-04-09 – 2018-04-10 (×3): 50 mg via ORAL
  Filled 2018-04-09 (×3): qty 1

## 2018-04-09 MED ORDER — POTASSIUM CHLORIDE 10 MEQ/100ML IV SOLN
10.0000 meq | INTRAVENOUS | Status: AC
  Administered 2018-04-09 (×4): 10 meq via INTRAVENOUS
  Filled 2018-04-09 (×4): qty 100

## 2018-04-09 MED ORDER — SODIUM CHLORIDE 0.9 % IV BOLUS
500.0000 mL | Freq: Once | INTRAVENOUS | Status: AC
  Administered 2018-04-09: 500 mL via INTRAVENOUS

## 2018-04-09 MED ORDER — FAMOTIDINE 20 MG PO TABS
20.0000 mg | ORAL_TABLET | Freq: Every day | ORAL | Status: DC
  Administered 2018-04-10 – 2018-04-11 (×2): 20 mg via ORAL
  Filled 2018-04-09 (×2): qty 1

## 2018-04-09 NOTE — Clinical Social Work Note (Signed)
LCSW spoke with patient's son. He stated that he had a lot of information presented to him yesterday and that he was processing the information.    Shaylon Aden, Clydene Pugh, LCSW

## 2018-04-09 NOTE — Progress Notes (Signed)
Patinet noted to have elevated heart rate up to 160s notified dr. Shanon Brow orders received and implemented. At current time running afib 119 to 136. Neo stopped blood pressure back within normal limits

## 2018-04-09 NOTE — Progress Notes (Signed)
Patient noted to be tachycardic during shift sustaining 130s to 160s. Midlevel paged. Order for IV lopressor and bolus due to systolic blood pressure in the 90s. Currently hr down to 116 afib on monitor. Blood pressure dropped to 61E systolic restarted neo as per orders.

## 2018-04-09 NOTE — Progress Notes (Signed)
Spoke with e link md, about elevated heart rate orders received.

## 2018-04-09 NOTE — Progress Notes (Addendum)
Patient ID: Brenda Hall, female    DOB: Apr 08, 1931, 84 y.o.   MRN: [id number]  Reason for consultation:  Heme positive stool and drop in hemoglobin  HPI Admitted thru the ED with worsening SOB.  Sats per EMS were 80%. In athe ED she was started on BiPaP.  She was g iven IV fluid bolus in the ED.  She apparently had a stool with blood in the ED. We were consulted. She denies any pain.  She states she has left sided weakness. RN on duty this am stated patient had a yellow stool last night.  Her room air sats this am 96%. HR in the 160s. She denies any abdominal pain.  Neo-synephrine is not running.  Underwent a CXR which revealed:  IMPRESSION: 1. Coarse interstitial and alveolar opacities left mid lung and both lung bases could reflect pulmonary edema or pneumonia. Urinalysis revealed many bacteria and trace of leukocytes. Blood cultures negative.  Admission hemoglobin 12.9. today her hemoglobin is 9.2. Her last colonoscopy was in October of 2014 which revealed: Examination performed to cecum. Small cecal polyp ablated via cold biopsy. Few small diverticula at sigmoid colon. Random biopsies taken from mucosa of sigmoid colon looking for collagenous or microscopic colitis. Small external hemorrhoids.  Hx of CVA, CKD, CAD, atrial fib.  CBC Latest Ref Rng & Units 04/09/2018 04/08/2018 04/08/2018  WBC 4.0 - 10.5 K/uL 13.2(H) 15.3(H) -  Hemoglobin 12.0 - 15.0 g/dL 9.2(L) 9.3(L) 12.9  Hematocrit 36.0 - 46.0 % 31.4(L) 32.1(L) 38.0  Platelets 150 - 400 K/uL 380 401(H) -    Review of Systems Past Medical History:  Diagnosis Date  . Anemia   . CAD in native artery    a. NSTEMI 12/2005 s/p BMS to Cx, prior normal LVEF.  Marland Kitchen Cancer ([place]) 1988 and  2009   BCC nose and  Melanoma back  . Cancer of skin Jan. 2014   [facility] Forehead and Nose  . Carotid stenosis    mild  . Chronic back pain   . CKD (chronic kidney disease), stage III ([place])   . Coronary atherosclerosis of unspecified type  of vessel, native or graft   . DJD (degenerative joint disease) of knee   . History of TIA (transient ischemic attack)   . HTN (hypertension)   . IBS (irritable bowel syndrome)   . Myocardial infarction [facility])    5 yrs ago     dr wall  cardiac  . Osteoporosis   . Other diseases of lung, not elsewhere classified    lung nodule  . Polymyalgia rheumatica ([place])   . Shingles 01/2016-02/2016  . Stroke [facility])     Past Surgical History:  Procedure Laterality Date  . ABDOMINAL HYSTERECTOMY  1977   partial  . BACK SURGERY  1982  . CARDIAC CATHETERIZATION    . COLONOSCOPY N/A 01/27/2013   Procedure: COLONOSCOPY;  Surgeon: Rogene Houston, MD;  Location[facility];  Service: Endoscopy;  Laterality: N/A;  1030  . EYE SURGERY Bilateral 04/2017  . EYE SURGERY Right 09/23/2017  . IR KYPHO LUMBAR INC FX REDUCE BONE BX UNI/BIL CANNULATION INC/IMAGING  09/30/2017  . IR VERTEBROPLASTY CERV/THOR BX INC UNI/BIL INC/INJECT/IMAGING  09/05/2017  . JOINT REPLACEMENT  2001 and  2011   6 knee surgeries   bil replacement  . KNEE ARTHROPLASTY    . KNEE ARTHROSCOPY    . LUMBAR LAMINECTOMY    . PR VEIN BYPASS GRAFT,AORTO-FEM-POP  2010  . removal  of melanoma from back  2007  . SPINE SURGERY  2013   Broken back: fusion surgery  . tonisllectomy  1944  . total knee anthroplasty    . VERTEBROPLASTY  07/11/2011   Procedure: VERTEBROPLASTY;  Surgeon: Faythe Ghee, MD;  Location: [place] NEURO ORS;  Service: Neurosurgery;  Laterality: N/A;  Vertebroplasty of Lumbar Two    Allergies  Allergen Reactions  . Epinephrine Other (See Comments)    During dental procedure-caused facial swelling at injection site. Pt has had epinephrine since then for nose and facial surgery and did fine with it per pt.  . Clopidogrel Bisulfate Itching and Rash    Plavix  . Oxycodone     Hallucinations. Pt states that she can tolerate hydrocodone    No current facility-administered medications on file prior to encounter.    Current  Outpatient Medications on File Prior to Encounter  Medication Sig Dispense Refill  . Abaloparatide (TYMLOS) 3120 MCG/1.56ML SOPN Inject 80 mcg into the skin daily. 1 pen 2  . acetaminophen (TYLENOL) 500 MG tablet Take 500 mg by mouth every 6 (six) hours as needed for mild pain.    Marland Kitchen amiodarone (PACERONE) 200 MG tablet Take 200 mg by mouth daily.    Marland Kitchen amLODipine (NORVASC) 5 MG tablet Take 5 mg by mouth daily.    Marland Kitchen apixaban (ELIQUIS) 5 MG TABS tablet Take 1 tablet (5 mg total) by mouth 2 (two) times daily. 60 tablet   . atorvastatin (LIPITOR) 80 MG tablet Take 1 tablet (80 mg total) by mouth at bedtime. 90 tablet 1  . Calcium Carbonate-Vit D-Min (CALCIUM 1200 PO) Take 1 tablet by mouth at bedtime.     Marland Kitchen estrogens, conjugated, (PREMARIN) 0.3 MG tablet Take 1 tablet (0.3 mg total) by mouth every evening.    . fluticasone (FLONASE) 50 MCG/ACT nasal spray Place 2 sprays into both nostrils daily.    Marland Kitchen gabapentin (NEURONTIN) 100 MG capsule Take 100 mg by mouth 3 (three) times daily.    . hydrocortisone cream 1 % Apply sparingly to rectal once a day prn    . loperamide (IMODIUM) 2 MG capsule Take 2 mg by mouth daily as needed for diarrhea or loose stools.     . Maltodextrin-Xanthan Gum (RESOURCE THICKENUP CLEAR) POWD Take 120 g by mouth as needed (nectar thick liquids).    . metoprolol tartrate (LOPRESSOR) 100 MG tablet Take 1 tablet (100 mg total) by mouth 2 (two) times daily.    . nitroGLYCERIN (NITROSTAT) 0.4 MG SL tablet Place 1 tablet (0.4 mg total) under the tongue every 5 (five) minutes as needed for chest pain. 30 tablet 0  . pantoprazole (PROTONIX) 40 MG tablet Take 40 mg by mouth daily.    . sertraline (ZOLOFT) 50 MG tablet Take 50 mg by mouth daily.    . traMADol (ULTRAM) 50 MG tablet Take 1 tablet (50 mg total) by mouth every 4 (four) hours as needed. 60 tablet 0  . Zinc 50 MG CAPS Take 50 mg by mouth daily.          Objective:   Physical Exam Blood pressure (!) 150/81, pulse (!) 135,  temperature 97.6 F (36.4 C), temperature source Axillary, resp. rate (!) 21, height 5\' 4"  (1.626 m), weight 67.6 kg, SpO2 97 %. Alert. . Skin warm and dry. Oral mucosa is moist.   . Sclera anicteric, conjunctivae is pink. Thyroid not enlarged. No cervical lymphadenopathy. Lungs clear. Heart: irregular rate  Abdomen is soft. Bowel sounds  are positive. No hepatomegaly. No abdominal masses felt. No tenderness.  Swelling to left hand presumed from IV infiltration. Both lower extremities are in boots.          Assessment & Plan:  Rectal bleed with one stool while in the ED.  ? Diverticular. Dr. Laural Golden is aware. She has had a drop in her hemoglobin.  Will continue to monitor. Continue the Pepcid  GI attending note: Patient without evidence of active GI bleed. Hemoglobin has been stable in the last 24 hours. Patient is being considered for comfort measures. Recommend continuing famotidine for prophylaxis as she is at risk for peptic ulcer disease as well as GERD. We will sign off.  Please call if patient has overt GI bleed.

## 2018-04-09 NOTE — Progress Notes (Signed)
Palliative note:   Received notice from SLP re: patient's MBS test- results showing significant dysphagia with aspiration of thin and nectar thick liquids. Discussed stated GOC per son- get patient through weekend- family coming to visit- then transition to Hospice and comfort. Agree with transition to thin liquids per patient's request for comfort and comfort feedings.    Please note- there will be no Palliative Provider on service until Monday. If patient is still admitted, will followup then.    Thank you-  Mariana Kaufman, AGNP-C Palliative Medicine  Please call Palliative Medicine team phone with any questions [phone]. For individual providers please see AMION.  No charge note

## 2018-04-09 NOTE — Progress Notes (Signed)
Modified Barium Swallow Progress Note  Patient Details  Name: Brenda Hall MRN: [id number] Date of Birth: 07/09/1930  Today's Date: 04/09/2018  Modified Barium Swallow completed.  Full report located under Chart Review in the Imaging Section.  Brief recommendations include the following:  Clinical Impression  Pt presents with functionally severe oropharyngeal dysphagia characterized by weak lingual manipulation and moderate to severe delays in swallow initiation with swallow trigger after filling the pyriforms and sitting for several seconds, and reduced laryngeal closure resulting in trace to moderate aspiration of nectars and thins during the swallow (between arytenoids and down posterior tracheal wall). Pt generally sensed aspiration of NTL, but was unsuccessful in removing aspirate. Thin liquids were generally silently aspirated and not removed despite a cued cough. Pt with greater pyriform pooling on the LEFT side before the swallow trigger due to left hemiparesis. Strategies and position changes were ineffective as Pt unable to implement. Pt assessed with puree and mech soft textures, however pooling and stasis observed in the pyriforms without a swallow trigger for both textures. Swallow triggered after Pt presented with liquid wash. Pt is at risk for aspiration with all textures and consistencies due to significant delay in swallow initiation, reduced laryngeal closure, poor cough/trunk support, and impulsivity with intake (unable to self regulate small sips). Pt is adamant that she is tired of diet modifications and desires thin liquids despite risk for aspiration. At this point, Pt is at equal risk for aspiration with thins and nectars. SLP discussed with Pt, Dr. Carles Collet, Palliative NP, dietician, and Pt's sister. Pt interested in pursuing comfort measures and feeds so will be placed on thin liquids and D2/chopped. SLP will follow during acute stay.   Swallow Evaluation Recommendations       SLP Diet Recommendations: Dysphagia 2 (Fine chop) solids;Thin liquid(per comfort feed request)   Liquid Administration via: Cup;Spoon(better with small sips)   Medication Administration: Whole meds with puree   Supervision: Patient able to self feed;Full assist for feeding;Full supervision/cueing for compensatory strategies   Compensations: Slow rate;Small sips/bites(limit distractions with po intake)   Postural Changes: Remain semi-upright after after feeds/meals (Comment);Seated upright at 90 degrees   Oral Care Recommendations: Oral care before and after PO;Staff/trained caregiver to provide oral care   Other Recommendations: Clarify dietary restrictions   Thank you,  Genene Churn, [place]  [place] 04/09/2018,9:20 PM

## 2018-04-09 NOTE — Progress Notes (Addendum)
PROGRESS NOTE  Brenda Hall KGY:[id number] DOB: 1930-06-21 DOA: 04/08/2018 PCP: [name], MD Brief History:  84 year old female with a history of coronary disease, CKD stage III, recent right MCA stroke, sick sinus syndrome, proximal atrial fibrillation presenting with respiratory distress from her nursing facility.  Apparently, the patient began developing shortness of breath approximately 1 hour prior to arrival to the emergency department.  EMS reported decreased mental status with oxygen saturations in the 60% range.  The patient arrived in the emergency department on CPAP with saturations 80%.  The patient was recently discharged from the hospital after stay from 03/02/2018 through 03/13/2018 for treatment of right MCA stroke as well as paroxysmal atrial fibrillation.  The patient has been bedbound with left hemiparesis since discharge from the hospital.  Work-up in the emergency department revealed sepsis likely secondary to UTI and pneumonia.  The patient was started on vancomycin and cefepime.  The patient was placed on BiPAP initially and started on Neo-Synephrine for hypotension.  Assessment/Plan: Sepsis -Present at the time of admission -Secondary to UTI and pneumonia -Lactic acid peaked 1.76 -Check procalcitonin--3.10>>> 1.66 -Continue vancomycin -Switch to Zosyn to cover for aspiration -Follow blood cultures--negative -Urine cultures pending -Sepsis physiology improved -Patient has been weaned off [place] associated pneumonia/aspiration pneumonia -The patient has dysphagia secondary to her stroke and is normally on the thickened diet -Continue Zosyn -Personally reviewed chest x-ray--increased right lower lobe and left lower lobe opacities -Check influenza PCR--negative -Patient evaluated by speech therapy--> MBS performed -MBS showed continued aspiration with nectar thickened liquids -Discussed with the patient and family--they  are willing to accept aspiration risk to allow the patient to be able to eat and drink  Pyuria -Concerning for UTI -Continue Zosyn pending culture data  Paroxysmal atrial fibrillation--now with RVR -Patient has developed RVR -Personally reviewed EKG--sinus rhythm, nonspecific ST changes -Continue amiodarone -Resume metoprolol tartrate -Start diltiazem drip  Acute on chronic renal failure--CKD stage III -Baseline creatinine 1.2-1.5 -Serum creatinine peaked 2.21 -Continue judicious IV fluids  Essential hypertension -Holding amlodipine and metoprolol tartrate secondary to sepsis and hypotension  Depression -Restart Zoloft and trazodone once able to tolerate po reliably  Hyperlipidemia -Restart Lipitor once able to tolerate p.o.  Hematochezia -The patient had bloody bowel movement in the emergency department -Holding apixaban temporarily -GI consult--suspected diverticular bleed--conservative management for now -Hemoglobin stable after initial 2 g drop which is probably dilution  Hypomagnesemia/hypokalemia -Replete  GOC -Patient seen by palliative medicine -Currently DNR -Start comfort feedings -Likely transition to residential hospice once family has visited Advance care planning, including the explanation and discussion of advance directives was carried out with the patient and family.  Code status including explanations of "Full Code" and "DNR" and alternatives were discussed in detail.  Discussion of end-of-life issues including but not limited palliative care, hospice care and the concept of hospice, other end-of-life care options, power of attorney for health care decisions, living wills, and physician orders for life-sustaining treatment were also discussed with the patient and family.  Total face to face time 16 minutes.    Disposition Plan:   likely residential hospice Family Communication:   Sister updated 04/09/18--Total time spent 35 minutes.  Greater  than 50% spent face to face counseling and coordinating care.   Consultants:  none  Code Status:  FULL   DVT Prophylaxis:  apixaban on hold   Procedures: As Listed in Progress Note Above  Antibiotics: vanco 12/18>>> Zosyn  12/18>>>    Subjective: Patient is more awake and alert.  She complains of intermittent shortness of breath.  She has chest discomfort which is reproducible.  She denies any nausea, vomiting, diarrhea, abdominal pain, dysuria.  She feels hungry and thirsty.  Objective: Vitals:   04/09/18 0800 04/09/18 0900 04/09/18 1000 04/09/18 1145  BP: 138/80 118/76 (!) 150/81   Pulse: (!) 109 (!) 40 (!) 135   Resp: (!) 28 (!) 25 (!) 21   Temp:    97.6 F (36.4 C)  TempSrc:    Axillary  SpO2: 98% 97% 97%   Weight:      Height:        Intake/Output Summary (Last 24 hours) at 04/09/2018 1432 Last data filed at 04/09/2018 1430 Gross per 24 hour  Intake 2459.31 ml  Output 640 ml  Net 1819.31 ml   Weight change: 5.02 kg Exam:   General:  Pt is alert, follows commands appropriately, not in acute distress  HEENT: No icterus, No thrush, No neck mass, [place]/AT  Cardiovascular: IRRR, S1/S2, no rubs, no gallops  Respiratory: Bibasilar rales.  No wheezing.  Good air movement.  Abdomen: Soft/+BS, non tender, non distended, no guarding  Extremities: No edema, No lymphangitis, No petechiae, No rashes, no synovitis   Data Reviewed: I have personally reviewed following labs and imaging studies Basic Metabolic Panel: Recent Labs  Lab 04/08/18 0007 04/08/18 0013 04/08/18 0431 04/09/18 0421  NA 143 145 144 148*  K 3.9 4.0 3.5 3.2*  CL 108 108 117* 121*  CO2 24  --  20* 21*  GLUCOSE 127* 124* 154* 95  BUN 58* 48* 50* 39*  CREATININE 2.21* 2.30* 1.86* 1.29*  CALCIUM 8.5*  --  7.2* 7.6*  MG  --   --   --  1.5*   Liver Function Tests: Recent Labs  Lab 04/08/18 0007 04/08/18 0431 04/09/18 0421  AST 71* 51* 48*  ALT 39 30 31  ALKPHOS 85 66 71    BILITOT 0.7 0.7 0.5  PROT 7.0 5.6* 5.5*  ALBUMIN 2.3* 1.9* 1.7*   No results for input(s): LIPASE, AMYLASE in the last 168 hours. No results for input(s): AMMONIA in the last 168 hours. Coagulation Profile: Recent Labs  Lab 04/08/18 0457  INR 2.93   CBC: Recent Labs  Lab 04/08/18 0007 04/08/18 0013 04/08/18 0431 04/09/18 0421  WBC 9.6  --  15.3* 13.2*  NEUTROABS 8.6*  --   --   --   HGB 11.9* 12.9 9.3* 9.2*  HCT 40.4 38.0 32.1* 31.4*  MCV 87.8  --  89.7 90.2  PLT 475*  --  401* 380   Cardiac Enzymes: No results for input(s): CKTOTAL, CKMB, CKMBINDEX, TROPONINI in the last 168 hours. BNP: Invalid input(s): POCBNP CBG: No results for input(s): GLUCAP in the last 168 hours. HbA1C: No results for input(s): HGBA1C in the last 72 hours. Urine analysis:    Component Value Date/Time   COLORURINE YELLOW 04/08/2018 0007   APPEARANCEUR CLOUDY (A) 04/08/2018 0007   LABSPEC 1.017 04/08/2018 0007   PHURINE 5.0 04/08/2018 0007   GLUCOSEU NEGATIVE 04/08/2018 0007   HGBUR NEGATIVE 04/08/2018 0007   BILIRUBINUR NEGATIVE 04/08/2018 0007   KETONESUR NEGATIVE 04/08/2018 0007   PROTEINUR NEGATIVE 04/08/2018 0007   UROBILINOGEN 0.2 07/28/2008 1156   NITRITE NEGATIVE 04/08/2018 0007   LEUKOCYTESUR TRACE (A) 04/08/2018 0007   Sepsis Labs: @LABRCNTIP (procalcitonin:4,lacticidven:4) ) Recent Results (from the past 240 hour(s))  Urine culture     Status:  None (Preliminary result)   Collection Time: 04/08/18 12:07 AM  Result Value Ref Range Status   Specimen Description   Final    URINE, CLEAN CATCH Performed at [facility], [address] [place], [place] [address]    Special Requests   Final    NONE Performed at [facility], [address] [place], [place] [address]    Culture   Final    CULTURE REINCUBATED FOR BETTER GROWTH Performed at [facility], [place] [address], [place], [place] [address]    Report Status PENDING  Incomplete  Blood Culture (routine x 2)      Status: None (Preliminary result)   Collection Time: 04/08/18 12:32 AM  Result Value Ref Range Status   Specimen Description BLOOD LEFT ANTECUBITAL  Final   Special Requests   Final    BOTTLES DRAWN AEROBIC AND ANAEROBIC Blood Culture adequate volume   Culture   Final    NO GROWTH 1 DAY Performed at [facility], [address] [place], [place] [address]    Report Status PENDING  Incomplete  Blood Culture (routine x 2)     Status: None (Preliminary result)   Collection Time: 04/08/18 12:39 AM  Result Value Ref Range Status   Specimen Description BLOOD LEFT HAND  Final   Special Requests   Final    AEROBIC BOTTLE ONLY Blood Culture results may not be optimal due to an inadequate volume of blood received in culture bottles   Culture   Final    NO GROWTH 1 DAY Performed at [facility], [address], [place], [place] [address]    Report Status PENDING  Incomplete  MRSA PCR Screening     Status: None   Collection Time: 04/08/18  8:10 AM  Result Value Ref Range Status   MRSA by PCR NEGATIVE NEGATIVE Final    Comment:        The GeneXpert MRSA Assay (FDA approved for NASAL specimens only), is one component of a comprehensive MRSA colonization surveillance program. It is not intended to diagnose MRSA infection nor to guide or monitor treatment for MRSA infections. Performed at [facility], [address], [place], [place] [address]      Scheduled Meds: . amiodarone  200 mg Oral Daily  . [START ON 04/10/2018] famotidine  20 mg Oral Daily  . mouth rinse  15 mL Mouth Rinse BID  . metoprolol tartrate  50 mg Oral BID   Continuous Infusions: . sodium chloride Stopped (04/08/18 0159)  . diltiazem (CARDIZEM) infusion 15 mg/hr (04/09/18 1430)  . lactated ringers 100 mL/hr at 04/09/18 0610  . phenylephrine (NEO-SYNEPHRINE) Adult infusion Stopped (04/09/18 0352)  . piperacillin-tazobactam (ZOSYN)  IV 3.375 g (04/09/18 1426)  . vancomycin 20 mL/hr at 04/09/18 1424     Procedures/Studies: Dg Chest Port 1 View  Result Date: 04/08/2018 CLINICAL DATA:  Shortness of breath. EXAM: PORTABLE CHEST 1 VIEW COMPARISON:  Chest x-ray dated March 08, 2018. FINDINGS: Stable cardiomediastinal silhouette. Coarse interstitial and alveolar opacities in the left mid lung and both lung bases. No pleural effusion or pneumothorax. No acute osseous abnormality. IMPRESSION: 1. Coarse interstitial and alveolar opacities left mid lung and both lung bases could reflect pulmonary edema or pneumonia. Electronically Signed   By: Titus Dubin M.D.   On: 04/08/2018 00:55    Orson Eva, DO  [facility] Pager [phone]  If 7PM-7AM, please contact night-coverage [email or web address] Password [email or web address] 04/09/2018, 2:32 PM   LOS: 1 day

## 2018-04-09 NOTE — Progress Notes (Signed)
[facility] Progress Note Patient Name: Brenda Hall DOB: [birth year] MRN: [id number]   Date of Service  04/09/2018  HPI/Events of Note  Notified of patient in afib RVR 120s to 150s. BP 123/72. K 3.2, Cl 121, Na 148.  Patient asymptomatic. Refusing to swallow medications.  eICU Interventions  Ordered K 40 meqs IV to run for 4 hours. Check Magnesium level. Switch IV to LRS from NS due to hyperchloremia, increase rate to 100 cc/hr for now but will need to re-evaluate later in the day if continuous fluids still needed.     Intervention Category Major Interventions: Arrhythmia - evaluation and management Intermediate Interventions: Electrolyte abnormality - evaluation and management  Judd Lien 04/09/2018, 5:57 AM

## 2018-04-09 NOTE — Clinical Social Work Note (Signed)
Patient states that she does not want to return to the [facility] at discharge. She states rehab and this hospitalization is "torture, just torture. All of it's torture." Patient indicated that prior to her stroke she was totally independent at baseline and that her adult grandson lives with her.  She stated that she wanted LCSW to speak with her son, Shanon Brow, regarding her discharge plan.    Damyen Knoll, Clydene Pugh, LCSW

## 2018-04-10 LAB — URINE CULTURE

## 2018-04-10 LAB — BASIC METABOLIC PANEL
ANION GAP: 6 (ref 5–15)
BUN: 25 mg/dL — ABNORMAL HIGH (ref 8–23)
CO2: 21 mmol/L — ABNORMAL LOW (ref 22–32)
Calcium: 8.2 mg/dL — ABNORMAL LOW (ref 8.9–10.3)
Chloride: 115 mmol/L — ABNORMAL HIGH (ref 98–111)
Creatinine, Ser: 0.97 mg/dL (ref 0.44–1.00)
GFR calc Af Amer: >60 mL/min (ref >60)
GFR calc non Af Amer: 53 mL/min — ABNORMAL LOW (ref >60)
Glucose, Bld: 95 mg/dL (ref 70–99)
Potassium: 3.5 mmol/L (ref 3.5–5.1)
Sodium: 142 mmol/L (ref 135–145)

## 2018-04-10 LAB — CBC
HCT: 31.6 % — ABNORMAL LOW (ref 36.0–46.0)
HEMOGLOBIN: 9.3 g/dL — AB (ref 12.0–15.0)
MCH: 26.2 pg (ref 26.0–34.0)
MCHC: 29.4 g/dL — ABNORMAL LOW (ref 30.0–36.0)
MCV: 89 fL (ref 80.0–100.0)
Platelets: 416 10*3/uL — ABNORMAL HIGH (ref 150–400)
RBC: 3.55 MIL/uL — AB (ref 3.87–5.11)
RDW: 16.3 % — ABNORMAL HIGH (ref 11.5–15.5)
WBC: 14.1 10*3/uL — ABNORMAL HIGH (ref 4.0–10.5)
nRBC: 0 % (ref 0.0–0.2)

## 2018-04-10 LAB — PROCALCITONIN: Procalcitonin: 0.92 ng/mL

## 2018-04-10 LAB — MAGNESIUM: Magnesium: 1.8 mg/dL (ref 1.7–2.4)

## 2018-04-10 MED ORDER — POTASSIUM CHLORIDE IN NACL 20-0.9 MEQ/L-% IV SOLN
INTRAVENOUS | Status: DC
  Administered 2018-04-10 – 2018-04-11 (×2): via INTRAVENOUS

## 2018-04-10 MED ORDER — METOPROLOL TARTRATE 50 MG PO TABS
75.0000 mg | ORAL_TABLET | Freq: Two times a day (BID) | ORAL | Status: DC
  Administered 2018-04-10 – 2018-04-11 (×2): 75 mg via ORAL
  Filled 2018-04-10 (×2): qty 1

## 2018-04-10 MED ORDER — MAGNESIUM SULFATE 2 GM/50ML IV SOLN
2.0000 g | Freq: Once | INTRAVENOUS | Status: AC
  Administered 2018-04-10: 2 g via INTRAVENOUS
  Filled 2018-04-10: qty 50

## 2018-04-10 MED ORDER — METOPROLOL TARTRATE 25 MG PO TABS
25.0000 mg | ORAL_TABLET | Freq: Once | ORAL | Status: AC
  Administered 2018-04-10: 25 mg via ORAL
  Filled 2018-04-10: qty 1

## 2018-04-10 NOTE — Progress Notes (Signed)
PROGRESS NOTE  Brenda Hall PVV:[id number] DOB: [birth year] DOA: 04/08/2018 PCP: [name], MD  Brief History: 84 year old female with a history of coronary disease, CKD stage III, recent right MCA stroke, sick sinus syndrome, proximal atrial fibrillation presenting with respiratory distress from her nursing facility. Apparently, the patient began developing shortness of breath approximately 1 hour prior to arrival to the emergency department. EMS reported decreased mental status with oxygen saturations in the 60% range. The patient arrived in the emergency department on CPAP with saturations 80%. The patient was recently discharged from the hospital after stay from 03/02/2018 through 03/13/2018 for treatment of right MCA stroke as well as paroxysmal atrial fibrillation. The patient has been bedbound with left hemiparesis since discharge from the hospital. Work-up in the emergency department revealed sepsis likely secondary to UTI and pneumonia. The patient was started on vancomycin and cefepime. The patient was placed on BiPAP initially and started on Neo-Synephrine for hypotension.  Assessment/Plan: Sepsis -Present at the time of admission -Secondary to UTI and pneumonia -Lactic acid peaked 1.76 -Check procalcitonin--3.10>>> 1.66 -disContinue vancomycin -Continue Zosyn to cover for aspiration -Follow blood cultures--negative -Urine cultures--unrevealing -Sepsis physiology improved -Patient has been weaned off [place] associated pneumonia/aspiration pneumonia -The patient has dysphagia secondary to her stroke and is normally on the thickened diet -Continue Zosyn -Personally reviewed chest x-ray--increased right lower lobe and left lower lobe opacities -Check influenza PCR--negative -Patient evaluated by speech therapy--> MBS performed -MBS showed continued aspiration with nectar thickened liquids -Discussed with the patient and  family--they are willing to accept aspiration risk to allow the patient to be able to eat and drink  UTI -Culture unrevealing, but patient presented with clinical syndrome consistent with UTI and sepsis -Concerning for UTI -Continue Zosyn pending culture data  Paroxysmal atrial fibrillation--now with RVR -Patient has developed RVR -Personally reviewed EKG--sinus rhythm, nonspecific ST changes -Continue amiodarone -Resume metoprolol tartrate--increased dose to 75 mg twice daily -Continue diltiazem drip  Acute on chronic renal failure--CKD stage III -Baseline creatinine 1.2-1.5 -Serum creatinine peaked 2.21 -Continue judicious IV fluids  Essential hypertension -Holding amlodipine initially secondary to sepsis and hypotension -Restarted metoprolol tartrate  Depression -Restart Zoloft   Hyperlipidemia -Restart Lipitor once able to tolerate p.o.  Hematochezia -The patient had bloody bowel movement in the emergency department -Holding apixaban -GI consult--suspected diverticular bleed--conservative management for now -Hemoglobin stable after initial 2 g drop which is probably dilution -No further hematochezia noted  Hypomagnesemia/hypokalemia -Replete  GOC -Patient seen by palliative medicine -Currently DNR -Start comfort feedings -Likely transition to residential hospice once family has visited Advance care planning, including the explanation and discussion of advance directives was carried out with the patient and family.  Code status including explanations of "Full Code" and "DNR" and alternatives were discussed in detail.  Discussion of end-of-life issues including but not limited palliative care, hospice care and the concept of hospice, other end-of-life care options, power of attorney for health care decisions, living wills, and physician orders for life-sustaining treatment were also discussed with the patient and family.  Total face to face time 16  minutes.    Disposition Plan:likely residential hospice Family Communication:Daughters updated 04/10/18--Total time spent 35 minutes.  Greater than 50% spent face to face counseling and coordinating care.   Consultants:none  Code Status: FULL   DVT Prophylaxis:apixaban on hold   Procedures: As Listed in Progress Note Above  Antibiotics: vanco 12/18>>>12/19 Zosyn 12/18>>>  Subjective: Patient is more awake and alert but intermittently confused pleasantly.  She states that she is breathing better on supplemental oxygen.  She denies any fevers, chills, chest pain, vomiting, diarrhea, abdominal pain.  She states that she feels that she has been getting on her feet intermittently.  Objective: Vitals:   04/10/18 0530 04/10/18 0600 04/10/18 0630 04/10/18 0906  BP: 116/75 111/68 125/68   Pulse: (!) 52 99 (!) 52   Resp: (!) 32 19 (!) 25   Temp:    98.7 F (37.1 C)  TempSrc:    Axillary  SpO2: 98% 96% 95%   Weight:      Height:        Intake/Output Summary (Last 24 hours) at 04/10/2018 1429 Last data filed at 04/10/2018 0305 Gross per 24 hour  Intake 1878.52 ml  Output -  Net 1878.52 ml   Weight change:  Exam:   General:  Pt is alert, follows commands appropriately, not in acute distress  HEENT: No icterus, No thrush, No neck mass, [place]/AT  Cardiovascular: RRR, S1/S2, no rubs, no gallops  Respiratory: Bilateral scattered rales.  No wheezing.  Good air movement.  Abdomen: Soft/+BS, non tender, non distended, no guarding  Extremities: No edema, No lymphangitis, No petechiae, No rashes, no synovitis   Data Reviewed: I have personally reviewed following labs and imaging studies Basic Metabolic Panel: Recent Labs  Lab 04/08/18 0007 04/08/18 0013 04/08/18 0431 04/09/18 0421 04/10/18 0422  NA 143 145 144 148* 142  K 3.9 4.0 3.5 3.2* 3.5  CL 108 108 117* 121* 115*  CO2 24  --  20* 21* 21*  GLUCOSE 127* 124* 154* 95 95  BUN 58*  48* 50* 39* 25*  CREATININE 2.21* 2.30* 1.86* 1.29* 0.97  CALCIUM 8.5*  --  7.2* 7.6* 8.2*  MG  --   --   --  1.5* 1.8   Liver Function Tests: Recent Labs  Lab 04/08/18 0007 04/08/18 0431 04/09/18 0421  AST 71* 51* 48*  ALT 39 30 31  ALKPHOS 85 66 71  BILITOT 0.7 0.7 0.5  PROT 7.0 5.6* 5.5*  ALBUMIN 2.3* 1.9* 1.7*   No results for input(s): LIPASE, AMYLASE in the last 168 hours. No results for input(s): AMMONIA in the last 168 hours. Coagulation Profile: Recent Labs  Lab 04/08/18 0457  INR 2.93   CBC: Recent Labs  Lab 04/08/18 0007 04/08/18 0013 04/08/18 0431 04/09/18 0421 04/10/18 0422  WBC 9.6  --  15.3* 13.2* 14.1*  NEUTROABS 8.6*  --   --   --   --   HGB 11.9* 12.9 9.3* 9.2* 9.3*  HCT 40.4 38.0 32.1* 31.4* 31.6*  MCV 87.8  --  89.7 90.2 89.0  PLT 475*  --  401* 380 416*   Cardiac Enzymes: No results for input(s): CKTOTAL, CKMB, CKMBINDEX, TROPONINI in the last 168 hours. BNP: Invalid input(s): POCBNP CBG: No results for input(s): GLUCAP in the last 168 hours. HbA1C: No results for input(s): HGBA1C in the last 72 hours. Urine analysis:    Component Value Date/Time   COLORURINE YELLOW 04/08/2018 0007   APPEARANCEUR CLOUDY (A) 04/08/2018 0007   LABSPEC 1.017 04/08/2018 0007   PHURINE 5.0 04/08/2018 0007   GLUCOSEU NEGATIVE 04/08/2018 0007   HGBUR NEGATIVE 04/08/2018 0007   BILIRUBINUR NEGATIVE 04/08/2018 0007   KETONESUR NEGATIVE 04/08/2018 0007   PROTEINUR NEGATIVE 04/08/2018 0007   UROBILINOGEN 0.2 07/28/2008 1156   NITRITE NEGATIVE 04/08/2018 0007   LEUKOCYTESUR TRACE (A) 04/08/2018  0007   Sepsis Labs: @LABRCNTIP (procalcitonin:4,lacticidven:4) ) Recent Results (from the past 240 hour(s))  Urine culture     Status: Abnormal   Collection Time: 04/08/18 12:07 AM  Result Value Ref Range Status   Specimen Description   Final    URINE, CLEAN CATCH Performed at [facility], [address], [place], [place] [address]    Special Requests   Final     NONE Performed at [facility], [address] [place], [place] [address]    [place], SUGGEST RECOLLECTION (A)  Final   Report Status 04/10/2018 FINAL  Final  Blood Culture (routine x 2)     Status: None (Preliminary result)   Collection Time: 04/08/18 12:32 AM  Result Value Ref Range Status   Specimen Description BLOOD LEFT ANTECUBITAL  Final   Special Requests   Final    BOTTLES DRAWN AEROBIC AND ANAEROBIC Blood Culture adequate volume   Culture   Final    NO GROWTH 2 DAYS Performed at [facility], [address], [place], [place] [address]    Report Status PENDING  Incomplete  Blood Culture (routine x 2)     Status: None (Preliminary result)   Collection Time: 04/08/18 12:39 AM  Result Value Ref Range Status   Specimen Description BLOOD LEFT HAND  Final   Special Requests   Final    AEROBIC BOTTLE ONLY Blood Culture results may not be optimal due to an inadequate volume of blood received in culture bottles   Culture   Final    NO GROWTH 2 DAYS Performed at [facility], [address], [place], [place] [address]    Report Status PENDING  Incomplete  MRSA PCR Screening     Status: None   Collection Time: 04/08/18  8:10 AM  Result Value Ref Range Status   MRSA by PCR NEGATIVE NEGATIVE Final    Comment:        The GeneXpert MRSA Assay (FDA approved for NASAL specimens only), is one component of a comprehensive MRSA colonization surveillance program. It is not intended to diagnose MRSA infection nor to guide or monitor treatment for MRSA infections. Performed at [facility], [address] [place], [place] [address]      Scheduled Meds: . amiodarone  200 mg Oral Daily  . famotidine  20 mg Oral Daily  . mouth rinse  15 mL Mouth Rinse BID  . metoprolol tartrate  50 mg Oral BID   Continuous Infusions: . sodium chloride Stopped (04/08/18 0159)  . diltiazem (CARDIZEM) infusion 15 mg/hr (04/10/18 1020)  . lactated ringers 100 mL/hr at  04/10/18 0305  . phenylephrine (NEO-SYNEPHRINE) Adult infusion Stopped (04/09/18 0352)  . piperacillin-tazobactam (ZOSYN)  IV 3.375 g (04/10/18 1300)    Procedures/Studies: Dg Chest Port 1 View  Result Date: 04/08/2018 CLINICAL DATA:  Shortness of breath. EXAM: PORTABLE CHEST 1 VIEW COMPARISON:  Chest x-ray dated March 08, 2018. FINDINGS: Stable cardiomediastinal silhouette. Coarse interstitial and alveolar opacities in the left mid lung and both lung bases. No pleural effusion or pneumothorax. No acute osseous abnormality. IMPRESSION: 1. Coarse interstitial and alveolar opacities left mid lung and both lung bases could reflect pulmonary edema or pneumonia. Electronically Signed   By: Titus Dubin M.D.   On: 04/08/2018 00:55   Dg Swallowing Func-speech Pathology  Result Date: 04/09/2018 Objective Swallowing Evaluation: Type of Study: MBS-Modified Barium Swallow Study  Patient Details Name: Brenda Hall MRN: [id number] Date of Birth: March 01, 1931 Today's Date: 04/09/2018 Time:  SLP Start Time (ACUTE ONLY): 1116 -SLP Stop Time (ACUTE ONLY): 1200 SLP Time Calculation (min) (ACUTE ONLY): 44 min Past Medical History: Past Medical History: Diagnosis Date . Anemia  . CAD in native artery   a. NSTEMI 12/2005 s/p BMS to Cx, prior normal LVEF. Marland Kitchen Cancer ([place]) 1988 and  2009  BCC nose and  Melanoma back . Cancer of skin Jan. 2014  [facility] Forehead and Nose . Carotid stenosis   mild . Chronic back pain  . CKD (chronic kidney disease), stage III ([place])  . Coronary atherosclerosis of unspecified type of vessel, native or graft  . DJD (degenerative joint disease) of knee  . History of TIA (transient ischemic attack)  . HTN (hypertension)  . IBS (irritable bowel syndrome)  . Myocardial infarction [facility])   5 yrs ago     dr wall  cardiac . Osteoporosis  . Other diseases of lung, not elsewhere classified   lung nodule . Polymyalgia rheumatica ([place])  . Shingles 01/2016-02/2016 . Stroke [facility])  Past Surgical History: Past  Surgical History: Procedure Laterality Date . ABDOMINAL HYSTERECTOMY  1977  partial . BACK SURGERY  1982 . CARDIAC CATHETERIZATION   . COLONOSCOPY N/A 01/27/2013  Procedure: COLONOSCOPY;  Surgeon: Rogene Houston, MD;  [facility];  Service: Endoscopy;  Laterality: N/A;  1030 . EYE SURGERY Bilateral 04/2017 . EYE SURGERY Right 09/23/2017 . IR KYPHO LUMBAR INC FX REDUCE BONE BX UNI/BIL CANNULATION INC/IMAGING  09/30/2017 . IR VERTEBROPLASTY CERV/THOR BX INC UNI/BIL INC/INJECT/IMAGING  09/05/2017 . JOINT REPLACEMENT  2001 and  2011  6 knee surgeries   bil replacement . KNEE ARTHROPLASTY   . KNEE ARTHROSCOPY   . LUMBAR LAMINECTOMY   . PR VEIN BYPASS GRAFT,AORTO-FEM-POP  2010 . removal of melanoma from back  2007 . SPINE SURGERY  2013  Broken back: fusion surgery . tonisllectomy  1944 . total knee anthroplasty   . VERTEBROPLASTY  07/11/2011  Procedure: VERTEBROPLASTY;  Surgeon: Faythe Ghee, MD;  Location: [facility];  Service: Neurosurgery;  Laterality: N/A;  Vertebroplasty of Lumbar Two HPI: 84 year old female with a history of coronary disease, CKD stage III, recent right MCA stroke, sick sinus syndrome, proximal atrial fibrillation presenting with respiratory distress from her nursing facility.  Apparently, the patient began developing shortness of breath approximately 1 hour prior to arrival to the emergency department.  EMS reported decreased mental status with oxygen saturations in the 60% range.  The patient arrived in the emergency department on CPAP with saturations 80%.  The patient was recently discharged from the hospital after stay from 03/02/2018 through 03/13/2018 for treatment of right MCA stroke as well as paroxysmal atrial fibrillation.  The patient has been bedbound with left hemiparesis since discharge from the hospital.  Work-up in the emergency department revealed sepsis likely secondary to UTI and pneumonia.  The patient was started on vancomycin and cefepime.  The patient was placed  on BiPAP initially and started on Neo-Synephrine for hypotension. BSE requested.  Subjective: "I just want to go home." Assessment / Plan / Recommendation CHL IP CLINICAL IMPRESSIONS 04/09/2018 Clinical Impression Pt presents with functionally severe oropharyngeal dysphagia characterized by weak lingual manipulation and moderate to severe delays in swallow initiation with swallow trigger after filling the pyriforms and sitting for several seconds, and reduced laryngeal closure resulting in trace to moderate aspiration of nectars and thins during the swallow (between arytenoids and down posterior tracheal wall). Pt generally sensed aspiration of NTL, but was unsuccessful in removing  aspirate. Thin liquids were generally silently aspirated and not removed despite a cued cough. Pt with greater pyriform pooling on the LEFT side before the swallow trigger due to left hemiparesis. Strategies and position changes were ineffective as Pt unable to implement. Pt assessed with puree and mech soft textures, however pooling and stasis observed in the pyriforms without a swallow trigger for both textures. Swallow triggered after Pt presented with liquid wash. Pt is at risk for aspiration with all textures and consistencies due to significant delay in swallow initiation, reduced laryngeal closure, poor cough/trunk support, and impulsivity with intake (unable to self regulate small sips). Pt is adamant that she is tired of diet modifications and desires thin liquids despite risk for aspiration. At this point, Pt is at equal risk for aspiration with thins and nectars. SLP discussed with Pt, Dr. Carles Collet, Palliative NP, dietician, and Pt's sister. Pt interested in pursuing comfort measures and feeds so will be placed on thin liquids and D2/chopped. SLP will follow during acute stay. SLP Visit Diagnosis Dysphagia, oropharyngeal phase (R13.12) Attention and concentration deficit following -- Frontal lobe and executive function deficit  following -- Impact on safety and function Severe aspiration risk;Risk for inadequate nutrition/hydration   CHL IP TREATMENT RECOMMENDATION 04/09/2018 Treatment Recommendations Therapy as outlined in treatment plan below   Prognosis 04/09/2018 Prognosis for Safe Diet Advancement Guarded Barriers to Reach Goals Severity of deficits Barriers/Prognosis Comment -- CHL IP DIET RECOMMENDATION 04/09/2018 SLP Diet Recommendations Dysphagia 2 (Fine chop) solids;Thin liquid Liquid Administration via Cup;Spoon Medication Administration Whole meds with puree Compensations Slow rate;Small sips/bites Postural Changes Remain semi-upright after after feeds/meals (Comment);Seated upright at 90 degrees   CHL IP OTHER RECOMMENDATIONS 04/09/2018 Recommended Consults -- Oral Care Recommendations Oral care before and after PO;Staff/trained caregiver to provide oral care Other Recommendations Clarify dietary restrictions   CHL IP FOLLOW UP RECOMMENDATIONS 04/09/2018 Follow up Recommendations Skilled Nursing facility   [facility] IP FREQUENCY AND DURATION 04/09/2018 Speech Therapy Frequency (ACUTE ONLY) min 2x/week Treatment Duration 1 week      CHL IP ORAL PHASE 04/09/2018 Oral Phase Impaired Oral - Pudding Teaspoon -- Oral - Pudding Cup -- Oral - Honey Teaspoon -- Oral - Honey Cup -- Oral - Nectar Teaspoon Weak lingual manipulation;Delayed oral transit Oral - Nectar Cup -- Oral - Nectar Straw -- Oral - Thin Teaspoon Weak lingual manipulation;Delayed oral transit Oral - Thin Cup Weak lingual manipulation;Delayed oral transit Oral - Thin Straw -- Oral - Puree Weak lingual manipulation;Reduced posterior propulsion;Delayed oral transit Oral - Mech Soft Impaired mastication;Weak lingual manipulation;Reduced posterior propulsion;Delayed oral transit Oral - Regular -- Oral - Multi-Consistency -- Oral - Pill -- Oral Phase - Comment --  CHL IP PHARYNGEAL PHASE 04/09/2018 Pharyngeal Phase Impaired Pharyngeal- Pudding Teaspoon -- Pharyngeal -- Pharyngeal-  Pudding Cup -- Pharyngeal -- Pharyngeal- Honey Teaspoon -- Pharyngeal -- Pharyngeal- Honey Cup -- Pharyngeal -- Pharyngeal- Nectar Teaspoon Delayed swallow initiation-pyriform sinuses;Reduced airway/laryngeal closure;Penetration/Aspiration during swallow;Trace aspiration;Moderate aspiration Pharyngeal Material enters airway, passes BELOW cords and not ejected out despite cough attempt by patient Pharyngeal- Nectar Cup -- Pharyngeal -- Pharyngeal- Nectar Straw -- Pharyngeal -- Pharyngeal- Thin Teaspoon Delayed swallow initiation-pyriform sinuses;Reduced airway/laryngeal closure;Penetration/Aspiration before swallow;Trace aspiration Pharyngeal Material enters airway, passes BELOW cords without attempt by patient to eject out (silent aspiration) Pharyngeal- Thin Cup Delayed swallow initiation-pyriform sinuses;Reduced airway/laryngeal closure;Penetration/Aspiration during swallow;Trace aspiration;Moderate aspiration Pharyngeal Material enters airway, passes BELOW cords without attempt by patient to eject out (silent aspiration) Pharyngeal- Thin Straw -- Pharyngeal -- Pharyngeal- Puree Delayed  swallow initiation-pyriform sinuses Pharyngeal -- Pharyngeal- Mechanical Soft Delayed swallow initiation-pyriform sinuses Pharyngeal -- Pharyngeal- Regular -- Pharyngeal -- Pharyngeal- Multi-consistency -- Pharyngeal -- Pharyngeal- Pill -- Pharyngeal -- Pharyngeal Comment --  CHL IP CERVICAL ESOPHAGEAL PHASE 04/09/2018 Cervical Esophageal Phase WFL Pudding Teaspoon -- Pudding Cup -- Honey Teaspoon -- Honey Cup -- Nectar Teaspoon -- Nectar Cup -- Nectar Straw -- Thin Teaspoon -- Thin Cup -- Thin Straw -- Puree -- Mechanical Soft -- Regular -- Multi-consistency -- Pill -- Cervical Esophageal Comment -- Thank you, Genene Churn, [place] PORTER,DABNEY 04/09/2018, 9:47 PM               Orson Eva, DO  [facility] Pager [phone]  If 7PM-7AM, please contact night-coverage [email or web address] Password  [email or web address] 04/10/2018, 2:29 PM   LOS: 2 days

## 2018-04-10 NOTE — Progress Notes (Signed)
  Speech Language Pathology Treatment:    Patient Details Name: Brenda Hall MRN: [id number] DOB: 1931/04/18 Today's Date: 04/10/2018 Time: 3299-2426 SLP Time Calculation (min) (ACUTE ONLY): 15 min  Assessment / Plan / Recommendation Clinical Impression  SLP to provide ongoing diagnostic dysphagia therapy; daughter (from out of town) present reports her mother "JUST went to sleep and was resting" she requested I not rouse her for treatment. Daughter reports they have opted for comfort and are aware of aspiration risks with providing thin liquids and textures of Pt's choice. SLP answered any questions and provided further education as function may continue to decline. This SLP consulted Dr. Carles Collet regarding this patient who is in agreement of goals of care. Hospice referral has been made and as mentioned family requests comfort feeds so there are no further ST needs noted at this time. ST to sign off. Thank you for allowing [place] to participate in the care of this patient. Thank you,   HPI HPI: 84 year old female with a history of coronary disease, CKD stage III, recent right MCA stroke, sick sinus syndrome, proximal atrial fibrillation presenting with respiratory distress from her nursing facility.  Apparently, the patient began developing shortness of breath approximately 1 hour prior to arrival to the emergency department.  EMS reported decreased mental status with oxygen saturations in the 60% range.  The patient arrived in the emergency department on CPAP with saturations 80%.  The patient was recently discharged from the hospital after stay from 03/02/2018 through 03/13/2018 for treatment of right MCA stroke as well as paroxysmal atrial fibrillation.  The patient has been bedbound with left hemiparesis since discharge from the hospital.  Work-up in the emergency department revealed sepsis likely secondary to UTI and pneumonia.  The patient was started on vancomycin and cefepime.  The patient was  placed on BiPAP initially and started on Neo-Synephrine for hypotension. BSE requested.          Recommendations  Compensations: Slow rate;Small sips/bites     Brenda Hall H. Roddie Mc, CCC-SLP Speech Language Pathologist    Brenda Hall 04/10/2018, 4:24 PM

## 2018-04-10 NOTE — Care Management Important Message (Signed)
Important Message  Patient Details  Name: Brenda Hall MRN: [id number] Date of Birth: 1930-12-24   Medicare Important Message Given:  Yes Left a letter in the room ,pt. unable to sign.   Holli Humbles Smith 04/10/2018, 2:21 PM

## 2018-04-10 NOTE — Clinical Social Work Note (Signed)
Per attending, referral was made to [facility].     Leilany Digeronimo, Clydene Pugh, LCSW

## 2018-04-11 DIAGNOSIS — L8990 Pressure ulcer of unspecified site, unspecified stage: Secondary | ICD-10-CM

## 2018-04-11 LAB — BASIC METABOLIC PANEL
Anion gap: 6 (ref 5–15)
BUN: 19 mg/dL (ref 8–23)
CHLORIDE: 115 mmol/L — AB (ref 98–111)
CO2: 22 mmol/L (ref 22–32)
Calcium: 8.5 mg/dL — ABNORMAL LOW (ref 8.9–10.3)
Creatinine, Ser: 0.87 mg/dL (ref 0.44–1.00)
GFR calc Af Amer: >60 mL/min (ref >60)
GFR calc non Af Amer: >60 mL/min (ref >60)
Glucose, Bld: 92 mg/dL (ref 70–99)
Potassium: 3.7 mmol/L (ref 3.5–5.1)
Sodium: 143 mmol/L (ref 135–145)

## 2018-04-11 LAB — CBC
HEMATOCRIT: 32.7 % — AB (ref 36.0–46.0)
Hemoglobin: 9.6 g/dL — ABNORMAL LOW (ref 12.0–15.0)
MCH: 26.2 pg (ref 26.0–34.0)
MCHC: 29.4 g/dL — ABNORMAL LOW (ref 30.0–36.0)
MCV: 89.1 fL (ref 80.0–100.0)
NRBC: 0 % (ref 0.0–0.2)
Platelets: 432 10*3/uL — ABNORMAL HIGH (ref 150–400)
RBC: 3.67 MIL/uL — ABNORMAL LOW (ref 3.87–5.11)
RDW: 16.7 % — ABNORMAL HIGH (ref 11.5–15.5)
WBC: 10 10*3/uL (ref 4.0–10.5)

## 2018-04-11 LAB — MAGNESIUM: Magnesium: 1.9 mg/dL (ref 1.7–2.4)

## 2018-04-11 MED ORDER — METOPROLOL TARTRATE 50 MG PO TABS
100.0000 mg | ORAL_TABLET | Freq: Two times a day (BID) | ORAL | Status: DC
  Administered 2018-04-11 – 2018-04-13 (×3): 100 mg via ORAL
  Filled 2018-04-11 (×4): qty 2

## 2018-04-11 MED ORDER — METOPROLOL TARTRATE 25 MG PO TABS
25.0000 mg | ORAL_TABLET | Freq: Once | ORAL | Status: DC
  Filled 2018-04-11: qty 1

## 2018-04-11 NOTE — Progress Notes (Signed)
PROGRESS NOTE  Lenore H Mooney-Riggs JJK:[id number] DOB: 08-24-1930 DOA: 04/08/2018 PCP: [name], MD  Brief History: 84 year old female with a history of coronary disease, CKD stage III, recent right MCA stroke, sick sinus syndrome, proximal atrial fibrillation presenting with respiratory distress from her nursing facility. Apparently, the patient began developing shortness of breath approximately 1 hour prior to arrival to the emergency department. EMS reported decreased mental status with oxygen saturations in the 60% range. The patient arrived in the emergency department on CPAP with saturations 80%. The patient was recently discharged from the hospital after stay from 03/02/2018 through 03/13/2018 for treatment of right MCA stroke as well as paroxysmal atrial fibrillation. The patient has been bedbound with left hemiparesis since discharge from the hospital. Work-up in the emergency department revealed sepsis likely secondary to UTI and pneumonia. The patient was started on vancomycin and cefepime. The patient was placed on BiPAP initially and started on Neo-Synephrine for hypotension.  Assessment/Plan: Sepsis -Present at the time of admission -Secondary to UTI and pneumonia -Lactic acid peaked 1.76 -Check procalcitonin--3.10>>>1.66 -disContinue vancomycin -Continue Zosyn to cover for aspiration -Follow bloodcultures--negative -Urine cultures--unrevealing -Sepsis physiology improved -Patient has been weaned off [place] associated pneumonia/aspiration pneumonia -The patient has dysphagia secondary to her stroke and is normally on the thickened diet -Continue Zosyn -Personally reviewed chest x-ray--increased right lower lobe and left lower lobe opacities -Check influenza PCR--negative -Patient evaluated by speech therapy-->MBS performed -MBS showed continued aspiration with nectar thickened liquids -Discussed with the patient and  family--they are willing to accept aspiration risk to allow the patient to be able to eat and drink  UTI -Culture unrevealing, but patient presented with clinical syndrome consistent with UTI and sepsis -Concerning for UTI -Continue Zosyn  Paroxysmal atrial fibrillation--now with RVR -Patient has developed RVR -Personally reviewed EKG--sinus rhythm, nonspecific ST changes -Continue amiodarone -Resume metoprolol tartrate--increased dose to 100 mg twice daily -Continue diltiazem drip-->Weaned off evening 04/10/2018  Acute on chronic renal failure--CKD stage III -Baseline creatinine 1.2-1.5 -Serum creatinine peaked 2.21 -Continue judicious IV fluids  Essential hypertension -Holding amlodipine initially secondary to sepsis and hypotension -Restarted metoprolol tartrate  Depression -Restart Zoloft   Hyperlipidemia -Restart Lipitor once able to tolerate p.o.  Hematochezia -The patient had bloody bowel movement in the emergency department -Holding apixaban -GI consult--suspected diverticular bleed--conservative management for now -Hemoglobin stable after initial 2 g drop which is probably dilution -No further hematochezia noted  Hypomagnesemia/hypokalemia -Replete  GOC -Patient seen by palliative medicine -Currently DNR -Start comfort feedings -Likely transition to residential hospice once family has visited Advance care planning, including the explanation and discussion of advance directives was carried out with the patient and family. Code status including explanations of "Full Code" and "DNR" and alternatives were discussed in detail. Discussion of end-of-life issues including but not limited palliative care, hospice care and the concept of hospice, other end-of-life care options, power of attorney for health care decisions, living wills, and physician orders for life-sustaining treatment were also discussed with the patient and family. Total face to face time 16  minutes. -After discussion with family,--> discontinue further blood draws; discontinue nonessential medications; continue antibiotics for now with the understanding that this will be discontinued once patient transitions to residential hospice    Disposition Plan:likely residential hospice Family Communication:Granddaughter updated 04/11/18--Total time spent 35 minutes. Greater than 50% spent face to face counseling and coordinating care.   Consultants:none  Code Status: FULL   DVT  Prophylaxis:apixaban on hold   Procedures: As Listed in Progress Note Above  Antibiotics: vanco 12/18>>>12/19 Zosyn 12/18>>>   Subjective: Patient is pleasantly confused, but she denies any fevers, chills, chest pain, shortness breath, abdominal pain.  Her oral intake is poor.  She does want to drink because of thirst.  Objective: Vitals:   04/11/18 1400 04/11/18 1500 04/11/18 1600 04/11/18 1617  BP: 121/74 127/72 134/87   Pulse: (!) 125 (!) 36 90 (!) 124  Resp: (!) 21 (!) 21 (!) 30 (!) 22  Temp:    98.6 F (37 C)  TempSrc:    Axillary  SpO2: 90% 90% 92% 94%  Weight:      Height:        Intake/Output Summary (Last 24 hours) at 04/11/2018 1728 Last data filed at 04/11/2018 1617 Gross per 24 hour  Intake 1615.57 ml  Output 1000 ml  Net 615.57 ml   Weight change:  Exam:   General:  Pt is alert, follows commands appropriately, not in acute distress; she is pleasantly confused  HEENT: No icterus, No thrush, No neck mass, [place]/AT  Cardiovascular: IRRR, S1/S2, no rubs, no gallops  Respiratory: Scattered bilateral rales.  No wheezing.  Good air movement  Abdomen: Soft/+BS, non tender, non distended, no guarding  Extremities: No edema, No lymphangitis, No petechiae, No rashes, no synovitis   Data Reviewed: I have personally reviewed following labs and imaging studies Basic Metabolic Panel: Recent Labs  Lab 04/08/18 0007 04/08/18 0013 04/08/18 0431  04/09/18 0421 04/10/18 0422 04/11/18 0412  NA 143 145 144 148* 142 143  K 3.9 4.0 3.5 3.2* 3.5 3.7  CL 108 108 117* 121* 115* 115*  CO2 24  --  20* 21* 21* 22  GLUCOSE 127* 124* 154* 95 95 92  BUN 58* 48* 50* 39* 25* 19  CREATININE 2.21* 2.30* 1.86* 1.29* 0.97 0.87  CALCIUM 8.5*  --  7.2* 7.6* 8.2* 8.5*  MG  --   --   --  1.5* 1.8 1.9   Liver Function Tests: Recent Labs  Lab 04/08/18 0007 04/08/18 0431 04/09/18 0421  AST 71* 51* 48*  ALT 39 30 31  ALKPHOS 85 66 71  BILITOT 0.7 0.7 0.5  PROT 7.0 5.6* 5.5*  ALBUMIN 2.3* 1.9* 1.7*   No results for input(s): LIPASE, AMYLASE in the last 168 hours. No results for input(s): AMMONIA in the last 168 hours. Coagulation Profile: Recent Labs  Lab 04/08/18 0457  INR 2.93   CBC: Recent Labs  Lab 04/08/18 0007 04/08/18 0013 04/08/18 0431 04/09/18 0421 04/10/18 0422 04/11/18 0412  WBC 9.6  --  15.3* 13.2* 14.1* 10.0  NEUTROABS 8.6*  --   --   --   --   --   HGB 11.9* 12.9 9.3* 9.2* 9.3* 9.6*  HCT 40.4 38.0 32.1* 31.4* 31.6* 32.7*  MCV 87.8  --  89.7 90.2 89.0 89.1  PLT 475*  --  401* 380 416* 432*   Cardiac Enzymes: No results for input(s): CKTOTAL, CKMB, CKMBINDEX, TROPONINI in the last 168 hours. BNP: Invalid input(s): POCBNP CBG: No results for input(s): GLUCAP in the last 168 hours. HbA1C: No results for input(s): HGBA1C in the last 72 hours. Urine analysis:    Component Value Date/Time   COLORURINE YELLOW 04/08/2018 0007   APPEARANCEUR CLOUDY (A) 04/08/2018 0007   LABSPEC 1.017 04/08/2018 0007   PHURINE 5.0 04/08/2018 0007   GLUCOSEU NEGATIVE 04/08/2018 0007   HGBUR NEGATIVE 04/08/2018 0007   BILIRUBINUR NEGATIVE  04/08/2018 0007   KETONESUR NEGATIVE 04/08/2018 0007   PROTEINUR NEGATIVE 04/08/2018 0007   UROBILINOGEN 0.2 07/28/2008 1156   NITRITE NEGATIVE 04/08/2018 0007   LEUKOCYTESUR TRACE (A) 04/08/2018 0007   Sepsis Labs: @LABRCNTIP (procalcitonin:4,lacticidven:4) ) Recent Results (from the past  240 hour(s))  Urine culture     Status: Abnormal   Collection Time: 04/08/18 12:07 AM  Result Value Ref Range Status   Specimen Description   Final    URINE, CLEAN CATCH Performed at [facility], [address], [place], [place] [address]    Special Requests   Final    NONE Performed at [facility], [address] [place], [place] [address]    Culture MULTIPLE SPECIES PRESENT, SUGGEST RECOLLECTION (A)  Final   Report Status 04/10/2018 FINAL  Final  Blood Culture (routine x 2)     Status: None (Preliminary result)   Collection Time: 04/08/18 12:32 AM  Result Value Ref Range Status   Specimen Description BLOOD LEFT ANTECUBITAL  Final   Special Requests   Final    BOTTLES DRAWN AEROBIC AND ANAEROBIC Blood Culture adequate volume   Culture   Final    NO GROWTH 3 DAYS Performed at [facility], [address] [place], [place] [address]    Report Status PENDING  Incomplete  Blood Culture (routine x 2)     Status: None (Preliminary result)   Collection Time: 04/08/18 12:39 AM  Result Value Ref Range Status   Specimen Description BLOOD LEFT HAND  Final   Special Requests   Final    AEROBIC BOTTLE ONLY Blood Culture results may not be optimal due to an inadequate volume of blood received in culture bottles   Culture   Final    NO GROWTH 3 DAYS Performed at [facility], [address], [place], [place] [address]    Report Status PENDING  Incomplete  MRSA PCR Screening     Status: None   Collection Time: 04/08/18  8:10 AM  Result Value Ref Range Status   MRSA by PCR NEGATIVE NEGATIVE Final    Comment:        The GeneXpert MRSA Assay (FDA approved for NASAL specimens only), is one component of a comprehensive MRSA colonization surveillance program. It is not intended to diagnose MRSA infection nor to guide or monitor treatment for MRSA infections. Performed at [facility], [address] [place], [place] [address]      Scheduled Meds: . amiodarone  200 mg Oral Daily  .  famotidine  20 mg Oral Daily  . mouth rinse  15 mL Mouth Rinse BID  . metoprolol tartrate  100 mg Oral BID  . metoprolol tartrate  25 mg Oral Once   Continuous Infusions: . sodium chloride Stopped (04/08/18 0159)  . 0.9 % NaCl with KCl 20 mEq / L 50 mL/hr at 04/11/18 1530  . diltiazem (CARDIZEM) infusion Stopped (04/11/18 0006)  . piperacillin-tazobactam (ZOSYN)  IV 3.375 g (04/11/18 1535)    Procedures/Studies: Dg Chest Port 1 View  Result Date: 04/08/2018 CLINICAL DATA:  Shortness of breath. EXAM: PORTABLE CHEST 1 VIEW COMPARISON:  Chest x-ray dated March 08, 2018. FINDINGS: Stable cardiomediastinal silhouette. Coarse interstitial and alveolar opacities in the left mid lung and both lung bases. No pleural effusion or pneumothorax. No acute osseous abnormality. IMPRESSION: 1. Coarse interstitial and alveolar opacities left mid lung and both lung bases could reflect pulmonary edema or pneumonia. Electronically Signed   By: Titus Dubin M.D.   On: 04/08/2018  00:55   Dg Swallowing Func-speech Pathology  Result Date: 04/09/2018 Objective Swallowing Evaluation: Type of Study: MBS-Modified Barium Swallow Study  Patient Details Name: Para H Mooney-Riggs MRN: [id number] Date of Birth: 02-22-1931 Today's Date: 04/09/2018 Time: SLP Start Time (ACUTE ONLY): 1116 -SLP Stop Time (ACUTE ONLY): 1200 SLP Time Calculation (min) (ACUTE ONLY): 44 min Past Medical History: Past Medical History: Diagnosis Date . Anemia  . CAD in native artery   a. NSTEMI 12/2005 s/p BMS to Cx, prior normal LVEF. Marland Kitchen Cancer ([place]) 1988 and  2009  BCC nose and  Melanoma back . Cancer of skin Jan. 2014  [facility] Forehead and Nose . Carotid stenosis   mild . Chronic back pain  . CKD (chronic kidney disease), stage III ([place])  . Coronary atherosclerosis of unspecified type of vessel, native or graft  . DJD (degenerative joint disease) of knee  . History of TIA (transient ischemic attack)  . HTN (hypertension)  . IBS (irritable bowel syndrome)   . Myocardial infarction [facility])   5 yrs ago     dr wall  cardiac . Osteoporosis  . Other diseases of lung, not elsewhere classified   lung nodule . Polymyalgia rheumatica ([place])  . Shingles 01/2016-02/2016 . Stroke [facility])  Past Surgical History: Past Surgical History: Procedure Laterality Date . ABDOMINAL HYSTERECTOMY  1977  partial . BACK SURGERY  1982 . CARDIAC CATHETERIZATION   . COLONOSCOPY N/A 01/27/2013  Procedure: COLONOSCOPY;  Surgeon: Rogene Houston, MD;  [facility];  Service: Endoscopy;  Laterality: N/A;  1030 . EYE SURGERY Bilateral 04/2017 . EYE SURGERY Right 09/23/2017 . IR KYPHO LUMBAR INC FX REDUCE BONE BX UNI/BIL CANNULATION INC/IMAGING  09/30/2017 . IR VERTEBROPLASTY CERV/THOR BX INC UNI/BIL INC/INJECT/IMAGING  09/05/2017 . JOINT REPLACEMENT  2001 and  2011  6 knee surgeries   bil replacement . KNEE ARTHROPLASTY   . KNEE ARTHROSCOPY   . LUMBAR LAMINECTOMY   . PR VEIN BYPASS GRAFT,AORTO-FEM-POP  2010 . removal of melanoma from back  2007 . SPINE SURGERY  2013  Broken back: fusion surgery . tonisllectomy  1944 . total knee anthroplasty   . VERTEBROPLASTY  07/11/2011  Procedure: VERTEBROPLASTY;  Surgeon: Faythe Ghee, MD;  Location: [place] NEURO ORS;  Service: Neurosurgery;  Laterality: N/A;  Vertebroplasty of Lumbar Two HPI: 84 year old female with a history of coronary disease, CKD stage III, recent right MCA stroke, sick sinus syndrome, proximal atrial fibrillation presenting with respiratory distress from her nursing facility.  Apparently, the patient began developing shortness of breath approximately 1 hour prior to arrival to the emergency department.  EMS reported decreased mental status with oxygen saturations in the 60% range.  The patient arrived in the emergency department on CPAP with saturations 80%.  The patient was recently discharged from the hospital after stay from 03/02/2018 through 03/13/2018 for treatment of right MCA stroke as well as paroxysmal atrial fibrillation.  The  patient has been bedbound with left hemiparesis since discharge from the hospital.  Work-up in the emergency department revealed sepsis likely secondary to UTI and pneumonia.  The patient was started on vancomycin and cefepime.  The patient was placed on BiPAP initially and started on Neo-Synephrine for hypotension. BSE requested.  Subjective: "I just want to go home." Assessment / Plan / Recommendation CHL IP CLINICAL IMPRESSIONS 04/09/2018 Clinical Impression Pt presents with functionally severe oropharyngeal dysphagia characterized by weak lingual manipulation and moderate to severe delays in swallow initiation with swallow trigger after filling the pyriforms and sitting  for several seconds, and reduced laryngeal closure resulting in trace to moderate aspiration of nectars and thins during the swallow (between arytenoids and down posterior tracheal wall). Pt generally sensed aspiration of NTL, but was unsuccessful in removing aspirate. Thin liquids were generally silently aspirated and not removed despite a cued cough. Pt with greater pyriform pooling on the LEFT side before the swallow trigger due to left hemiparesis. Strategies and position changes were ineffective as Pt unable to implement. Pt assessed with puree and mech soft textures, however pooling and stasis observed in the pyriforms without a swallow trigger for both textures. Swallow triggered after Pt presented with liquid wash. Pt is at risk for aspiration with all textures and consistencies due to significant delay in swallow initiation, reduced laryngeal closure, poor cough/trunk support, and impulsivity with intake (unable to self regulate small sips). Pt is adamant that she is tired of diet modifications and desires thin liquids despite risk for aspiration. At this point, Pt is at equal risk for aspiration with thins and nectars. SLP discussed with Pt, Dr. Carles Collet, Palliative NP, dietician, and Pt's sister. Pt interested in pursuing comfort measures  and feeds so will be placed on thin liquids and D2/chopped. SLP will follow during acute stay. SLP Visit Diagnosis Dysphagia, oropharyngeal phase (R13.12) Attention and concentration deficit following -- Frontal lobe and executive function deficit following -- Impact on safety and function Severe aspiration risk;Risk for inadequate nutrition/hydration   CHL IP TREATMENT RECOMMENDATION 04/09/2018 Treatment Recommendations Therapy as outlined in treatment plan below   Prognosis 04/09/2018 Prognosis for Safe Diet Advancement Guarded Barriers to Reach Goals Severity of deficits Barriers/Prognosis Comment -- CHL IP DIET RECOMMENDATION 04/09/2018 SLP Diet Recommendations Dysphagia 2 (Fine chop) solids;Thin liquid Liquid Administration via Cup;Spoon Medication Administration Whole meds with puree Compensations Slow rate;Small sips/bites Postural Changes Remain semi-upright after after feeds/meals (Comment);Seated upright at 90 degrees   CHL IP OTHER RECOMMENDATIONS 04/09/2018 Recommended Consults -- Oral Care Recommendations Oral care before and after PO;Staff/trained caregiver to provide oral care Other Recommendations Clarify dietary restrictions   CHL IP FOLLOW UP RECOMMENDATIONS 04/09/2018 Follow up Recommendations Skilled Nursing facility   [facility] IP FREQUENCY AND DURATION 04/09/2018 Speech Therapy Frequency (ACUTE ONLY) min 2x/week Treatment Duration 1 week      CHL IP ORAL PHASE 04/09/2018 Oral Phase Impaired Oral - Pudding Teaspoon -- Oral - Pudding Cup -- Oral - Honey Teaspoon -- Oral - Honey Cup -- Oral - Nectar Teaspoon Weak lingual manipulation;Delayed oral transit Oral - Nectar Cup -- Oral - Nectar Straw -- Oral - Thin Teaspoon Weak lingual manipulation;Delayed oral transit Oral - Thin Cup Weak lingual manipulation;Delayed oral transit Oral - Thin Straw -- Oral - Puree Weak lingual manipulation;Reduced posterior propulsion;Delayed oral transit Oral - Mech Soft Impaired mastication;Weak lingual  manipulation;Reduced posterior propulsion;Delayed oral transit Oral - Regular -- Oral - Multi-Consistency -- Oral - Pill -- Oral Phase - Comment --  CHL IP PHARYNGEAL PHASE 04/09/2018 Pharyngeal Phase Impaired Pharyngeal- Pudding Teaspoon -- Pharyngeal -- Pharyngeal- Pudding Cup -- Pharyngeal -- Pharyngeal- Honey Teaspoon -- Pharyngeal -- Pharyngeal- Honey Cup -- Pharyngeal -- Pharyngeal- Nectar Teaspoon Delayed swallow initiation-pyriform sinuses;Reduced airway/laryngeal closure;Penetration/Aspiration during swallow;Trace aspiration;Moderate aspiration Pharyngeal Material enters airway, passes BELOW cords and not ejected out despite cough attempt by patient Pharyngeal- Nectar Cup -- Pharyngeal -- Pharyngeal- Nectar Straw -- Pharyngeal -- Pharyngeal- Thin Teaspoon Delayed swallow initiation-pyriform sinuses;Reduced airway/laryngeal closure;Penetration/Aspiration before swallow;Trace aspiration Pharyngeal Material enters airway, passes BELOW cords without attempt by patient to eject out (silent aspiration)  Pharyngeal- Thin Cup Delayed swallow initiation-pyriform sinuses;Reduced airway/laryngeal closure;Penetration/Aspiration during swallow;Trace aspiration;Moderate aspiration Pharyngeal Material enters airway, passes BELOW cords without attempt by patient to eject out (silent aspiration) Pharyngeal- Thin Straw -- Pharyngeal -- Pharyngeal- Puree Delayed swallow initiation-pyriform sinuses Pharyngeal -- Pharyngeal- Mechanical Soft Delayed swallow initiation-pyriform sinuses Pharyngeal -- Pharyngeal- Regular -- Pharyngeal -- Pharyngeal- Multi-consistency -- Pharyngeal -- Pharyngeal- Pill -- Pharyngeal -- Pharyngeal Comment --  CHL IP CERVICAL ESOPHAGEAL PHASE 04/09/2018 Cervical Esophageal Phase WFL Pudding Teaspoon -- Pudding Cup -- Honey Teaspoon -- Honey Cup -- Nectar Teaspoon -- Nectar Cup -- Nectar Straw -- Thin Teaspoon -- Thin Cup -- Thin Straw -- Puree -- Mechanical Soft -- Regular -- Multi-consistency -- Pill  -- Cervical Esophageal Comment -- Thank you, Genene Churn, [place] PORTER,DABNEY 04/09/2018, 9:47 PM               Orson Eva, DO  [facility] Pager [phone]  If 7PM-7AM, please contact night-coverage [email or web address] Password [email or web address] 04/11/2018, 5:28 PM   LOS: 3 days

## 2018-04-11 NOTE — Progress Notes (Signed)
Pt weaned off Diltiazem.  Pt given Metoprolol 25 mg OTO by previous shift with new order for Metoprolol 75 mg started and given this evening at 2215.  Pt currently sleeping with BP dipping as low as 88/54 prior to stopping diltiazem drip.  Dr Shanon Brow paged with MD stating I need to call the floor coverage MD.  Dr Silas [place] paged to inform.

## 2018-04-11 NOTE — Progress Notes (Signed)
Per nightshift RT patient did not need the BiPAP last night.

## 2018-04-12 MED ORDER — AMOXICILLIN-POT CLAVULANATE 875-125 MG PO TABS: 1.0000 | ORAL_TABLET | Freq: Two times a day (BID) | ORAL | Status: DC

## 2018-04-12 MED ORDER — MORPHINE SULFATE (PF) 2 MG/ML IV SOLN: 2.0000 mg | INTRAVENOUS | Status: DC | PRN

## 2018-04-12 MED ORDER — LORAZEPAM 2 MG/ML IJ SOLN
0.5000 mg | INTRAMUSCULAR | Status: DC | PRN
  Administered 2018-04-12: 0.5 mg via INTRAVENOUS
  Filled 2018-04-12: qty 1

## 2018-04-12 MED ORDER — METOPROLOL TARTRATE 5 MG/5ML IV SOLN
5.0000 mg | Freq: Once | INTRAVENOUS | Status: AC
  Administered 2018-04-12: 5 mg via INTRAVENOUS
  Filled 2018-04-12: qty 5

## 2018-04-12 NOTE — Progress Notes (Addendum)
PROGRESS NOTE  Brenda Hall KCL:[id number] DOB: [birth year] DOA: 04/08/2018 PCP: [name], MD  Brief History: 84 year old female with a history of coronary disease, CKD stage III, recent right MCA stroke, sick sinus syndrome, proximal atrial fibrillation presenting with respiratory distress from her nursing facility. Apparently, the patient began developing shortness of breath approximately 1 hour prior to arrival to the emergency department. EMS reported decreased mental status with oxygen saturations in the 60% range. The patient arrived in the emergency department on CPAP with saturations 80%. The patient was recently discharged from the hospital after stay from 03/02/2018 through 03/13/2018 for treatment of right MCA stroke as well as paroxysmal atrial fibrillation. The patient has been bedbound with left hemiparesis since discharge from the hospital. Work-up in the emergency department revealed sepsis likely secondary to UTI and pneumonia. The patient was started on vancomycin and cefepime. The patient was placed on BiPAP initially and started on Neo-Synephrine for hypotension.  The patient was subsequently weaned off of Neo-Synephrine.  Although the patient did have some clinical improvement, her overall prognosis continued to be poor.  In addition, the patient has continued to have a worsening functional decline since her stroke.  Multiple family meetings were undertaken with the patient's son and family.  Goals of care and advance care planning were discussed.  Ultimately, the son and family agreed that it was in the patient's best interest to transition her focus of care to that of full comfort.  Assessment/Plan: Sepsis -Present at the time of admission -Secondary to UTI and pneumonia -Lactic acid peaked 1.76 -Check procalcitonin--3.10>>>1.66 -disContinue vancomycin -ContinueZosyn to cover for aspiration -Follow bloodcultures--negative -Urine  cultures--unrevealing -Sepsis physiology improved -Patient has been weaned off [place] associated pneumonia/aspiration pneumonia -The patient has dysphagia secondary to her stroke and is normally on the thickened diet -Continue Zosyn -Personally reviewed chest x-ray--increased right lower lobe and left lower lobe opacities -Check influenza PCR--negative -Patient evaluated by speech therapy-->MBS performed -MBS showed continued aspiration with nectar thickened liquids -Discussed with the patient and family--they are willing to accept aspiration risk to allow the patient to be able to eat and drink -After multiple family meetings, the patient son and family agreed to transition the patient's focus of care to focus on full comfort  UTI -Culture unrevealing, but patient presented with clinical syndrome consistent with UTI and sepsis -Concerning for UTI -Continue Zosyn -After multiple family meetings, the patient son and family agreed to transition the patient's focus of care to focus on full comfort  Paroxysmal atrial fibrillation--now with RVR -Patient has developed RVR -Personally reviewed EKG--sinus rhythm, nonspecific ST changes -Continue amiodarone -Resume metoprolol tartrate--increased dose to 100 mg twice daily -Continuediltiazem drip-->Weaned off evening 04/10/2018 -After multiple family meetings, the patient son and family agreed to transition the patient's focus of care to focus on full comfort  Acute on chronic renal failure--CKD stage III -Baseline creatinine 1.2-1.5 -Serum creatinine peaked 2.21 -Continue judicious IV fluids-->d/c  Essential hypertension -Holding amlodipineinitially secondary to sepsis and hypotension -Restarted metoprolol tartrate  Depression -Restart Zoloft   Hyperlipidemia -Restart Lipitor once able to tolerate p.o.  Hematochezia -The patient had bloody bowel movement in the emergency department -Holding  apixaban -GI consult--suspected diverticular bleed--conservative management for now -Hemoglobin stable after initial 2 g drop which is probably dilution -No further hematochezia noted  Hypomagnesemia/hypokalemia -Repleted  Stage 1 sacral ulcer -not infected -local wound care -present at time of admission  [place] -Patient seen  by palliative medicine -Currently DNR -Start comfort feedings -After discussion with family,--> discontinue further blood draws; discontinue nonessential medications; continue antibiotics for now with the understanding that this will be discontinued once patient transitions to residential hospice -04/12/2018--discussed with the patient son at the bedside--> transition the patient's focus of care to focus on full comfort -Start morphine 2 mg IV every 1 hours    Disposition Plan:residential hospice vs in-hospital death Family Communication:Son updated at bedside   Consultants:none  Code Status: FULL COMFORT  DVT Prophylaxis:apixaban on hold   Procedures: As Listed in Progress Note Above  Antibiotics: vanco 12/18>>>12/19 Zosyn 12/18>>>12/22    Subjective: Patient complains of some shortness of breath this morning but denies any chest pain, nausea, vomiting, abdominal pain.  There is no fevers, chills, headache, neck pain.  Objective: Vitals:   04/11/18 1930 04/11/18 2000 04/11/18 2056 04/12/18 0524  BP: 116/79 126/75 137/87 125/83  Pulse: (!) 141 (!) 55 (!) 108 73  Resp: 18 (!) 23 (!) 21 18  Temp:   (!) 97.5 F (36.4 C) 98.1 F (36.7 C)  TempSrc:   Oral Oral  SpO2: 90% 91% 92% 96%  Weight:      Height:        Intake/Output Summary (Last 24 hours) at 04/12/2018 1311 Last data filed at 04/12/2018 0300 Gross per 24 hour  Intake 1016.33 ml  Output -  Net 1016.33 ml   Weight change:  Exam:   General:  Pt is alert, follows commands appropriately, not in acute distress  HEENT: No icterus, No thrush, No neck  mass, [place]/AT  Cardiovascular: RRR, S1/S2, no rubs, no gallops  Respiratory: Bilateral scattered rales and rhonchi.  No wheezing.  Abdomen: Soft/+BS, non tender, non distended, no guarding  Extremities: No edema, No lymphangitis, No petechiae, No rashes, no synovitis   Data Reviewed: I have personally reviewed following labs and imaging studies Basic Metabolic Panel: Recent Labs  Lab 04/08/18 0007 04/08/18 0013 04/08/18 0431 04/09/18 0421 04/10/18 0422 04/11/18 0412  NA 143 145 144 148* 142 143  K 3.9 4.0 3.5 3.2* 3.5 3.7  CL 108 108 117* 121* 115* 115*  CO2 24  --  20* 21* 21* 22  GLUCOSE 127* 124* 154* 95 95 92  BUN 58* 48* 50* 39* 25* 19  CREATININE 2.21* 2.30* 1.86* 1.29* 0.97 0.87  CALCIUM 8.5*  --  7.2* 7.6* 8.2* 8.5*  MG  --   --   --  1.5* 1.8 1.9   Liver Function Tests: Recent Labs  Lab 04/08/18 0007 04/08/18 0431 04/09/18 0421  AST 71* 51* 48*  ALT 39 30 31  ALKPHOS 85 66 71  BILITOT 0.7 0.7 0.5  PROT 7.0 5.6* 5.5*  ALBUMIN 2.3* 1.9* 1.7*   No results for input(s): LIPASE, AMYLASE in the last 168 hours. No results for input(s): AMMONIA in the last 168 hours. Coagulation Profile: Recent Labs  Lab 04/08/18 0457  INR 2.93   CBC: Recent Labs  Lab 04/08/18 0007 04/08/18 0013 04/08/18 0431 04/09/18 0421 04/10/18 0422 04/11/18 0412  WBC 9.6  --  15.3* 13.2* 14.1* 10.0  NEUTROABS 8.6*  --   --   --   --   --   HGB 11.9* 12.9 9.3* 9.2* 9.3* 9.6*  HCT 40.4 38.0 32.1* 31.4* 31.6* 32.7*  MCV 87.8  --  89.7 90.2 89.0 89.1  PLT 475*  --  401* 380 416* 432*   Cardiac Enzymes: No results for input(s): CKTOTAL, CKMB, CKMBINDEX, TROPONINI  in the last 168 hours. BNP: Invalid input(s): POCBNP CBG: No results for input(s): GLUCAP in the last 168 hours. HbA1C: No results for input(s): HGBA1C in the last 72 hours. Urine analysis:    Component Value Date/Time   COLORURINE YELLOW 04/08/2018 0007   APPEARANCEUR CLOUDY (A) 04/08/2018 0007   LABSPEC 1.017  04/08/2018 0007   PHURINE 5.0 04/08/2018 0007   GLUCOSEU NEGATIVE 04/08/2018 0007   HGBUR NEGATIVE 04/08/2018 0007   BILIRUBINUR NEGATIVE 04/08/2018 0007   KETONESUR NEGATIVE 04/08/2018 0007   PROTEINUR NEGATIVE 04/08/2018 0007   UROBILINOGEN 0.2 07/28/2008 1156   NITRITE NEGATIVE 04/08/2018 0007   LEUKOCYTESUR TRACE (A) 04/08/2018 0007   Sepsis Labs: @LABRCNTIP (procalcitonin:4,lacticidven:4) ) Recent Results (from the past 240 hour(s))  Urine culture     Status: Abnormal   Collection Time: 04/08/18 12:07 AM  Result Value Ref Range Status   Specimen Description   Final    URINE, CLEAN CATCH Performed at [facility], [address], [place], [place] [address]    Special Requests   Final    NONE Performed at [facility], [address] [place], [place] [address]    [place], SUGGEST RECOLLECTION (A)  Final   Report Status 04/10/2018 FINAL  Final  Blood Culture (routine x 2)     Status: None (Preliminary result)   Collection Time: 04/08/18 12:32 AM  Result Value Ref Range Status   Specimen Description BLOOD LEFT ANTECUBITAL  Final   Special Requests   Final    BOTTLES DRAWN AEROBIC AND ANAEROBIC Blood Culture adequate volume   Culture   Final    NO GROWTH 4 DAYS Performed at [facility], [address] [place], [place] [address]    Report Status PENDING  Incomplete  Blood Culture (routine x 2)     Status: None (Preliminary result)   Collection Time: 04/08/18 12:39 AM  Result Value Ref Range Status   Specimen Description BLOOD LEFT HAND  Final   Special Requests   Final    AEROBIC BOTTLE ONLY Blood Culture results may not be optimal due to an inadequate volume of blood received in culture bottles   Culture   Final    NO GROWTH 4 DAYS Performed at [facility], [address] [place], [place] [address]    Report Status PENDING  Incomplete  MRSA PCR Screening     Status: None   Collection Time: 04/08/18  8:10 AM  Result Value Ref Range Status    MRSA by PCR NEGATIVE NEGATIVE Final    Comment:        The GeneXpert MRSA Assay (FDA approved for NASAL specimens only), is one component of a comprehensive MRSA colonization surveillance program. It is not intended to diagnose MRSA infection nor to guide or monitor treatment for MRSA infections. Performed at [facility], [address], [place], [place] [address]      Scheduled Meds: . mouth rinse  15 mL Mouth Rinse BID  . metoprolol tartrate  100 mg Oral BID   Continuous Infusions: . sodium chloride Stopped (04/08/18 0159)    Procedures/Studies: Dg Chest Port 1 View  Result Date: 04/08/2018 CLINICAL DATA:  Shortness of breath. EXAM: PORTABLE CHEST 1 VIEW COMPARISON:  Chest x-ray dated March 08, 2018. FINDINGS: Stable cardiomediastinal silhouette. Coarse interstitial and alveolar opacities in the left mid lung and both lung bases. No pleural effusion or pneumothorax. No acute osseous abnormality. IMPRESSION: 1. Coarse interstitial and alveolar opacities left mid lung and both  lung bases could reflect pulmonary edema or pneumonia. Electronically Signed   By: Titus Dubin M.D.   On: 04/08/2018 00:55   Dg Swallowing Func-speech Pathology  Result Date: 04/09/2018 Objective Swallowing Evaluation: Type of Study: MBS-Modified Barium Swallow Study  Patient Details Name: Kambra H Hall MRN: [id number] Date of Birth: July 08, 1930 Today's Date: 04/09/2018 Time: SLP Start Time (ACUTE ONLY): 1116 -SLP Stop Time (ACUTE ONLY): 1200 SLP Time Calculation (min) (ACUTE ONLY): 44 min Past Medical History: Past Medical History: Diagnosis Date . Anemia  . CAD in native artery   a. NSTEMI 12/2005 s/p BMS to Cx, prior normal LVEF. Marland Kitchen Cancer ([place]) 1988 and  2009  BCC nose and  Melanoma back . Cancer of skin Jan. 2014  [facility] Forehead and Nose . Carotid stenosis   mild . Chronic back pain  . CKD (chronic kidney disease), stage III ([place])  . Coronary atherosclerosis of unspecified type of vessel, native or  graft  . DJD (degenerative joint disease) of knee  . History of TIA (transient ischemic attack)  . HTN (hypertension)  . IBS (irritable bowel syndrome)  . Myocardial infarction [facility])   5 yrs ago     dr wall  cardiac . Osteoporosis  . Other diseases of lung, not elsewhere classified   lung nodule . Polymyalgia rheumatica ([place])  . Shingles 01/2016-02/2016 . Stroke [facility])  Past Surgical History: Past Surgical History: Procedure Laterality Date . ABDOMINAL HYSTERECTOMY  1977  partial . BACK SURGERY  1982 . CARDIAC CATHETERIZATION   . COLONOSCOPY N/A 01/27/2013  Procedure: COLONOSCOPY;  Surgeon: Rogene Houston, MD;  [facility];  Service: Endoscopy;  Laterality: N/A;  1030 . EYE SURGERY Bilateral 04/2017 . EYE SURGERY Right 09/23/2017 . IR KYPHO LUMBAR INC FX REDUCE BONE BX UNI/BIL CANNULATION INC/IMAGING  09/30/2017 . IR VERTEBROPLASTY CERV/THOR BX INC UNI/BIL INC/INJECT/IMAGING  09/05/2017 . JOINT REPLACEMENT  2001 and  2011  6 knee surgeries   bil replacement . KNEE ARTHROPLASTY   . KNEE ARTHROSCOPY   . LUMBAR LAMINECTOMY   . PR VEIN BYPASS GRAFT,AORTO-FEM-POP  2010 . removal of melanoma from back  2007 . SPINE SURGERY  2013  Broken back: fusion surgery . tonisllectomy  1944 . total knee anthroplasty   . VERTEBROPLASTY  07/11/2011  Procedure: VERTEBROPLASTY;  Surgeon: Faythe Ghee, MD;  Location: [place] NEURO ORS;  Service: Neurosurgery;  Laterality: N/A;  Vertebroplasty of Lumbar Two HPI: 84 year old female with a history of coronary disease, CKD stage III, recent right MCA stroke, sick sinus syndrome, proximal atrial fibrillation presenting with respiratory distress from her nursing facility.  Apparently, the patient began developing shortness of breath approximately 1 hour prior to arrival to the emergency department.  EMS reported decreased mental status with oxygen saturations in the 60% range.  The patient arrived in the emergency department on CPAP with saturations 80%.  The patient was recently  discharged from the hospital after stay from 03/02/2018 through 03/13/2018 for treatment of right MCA stroke as well as paroxysmal atrial fibrillation.  The patient has been bedbound with left hemiparesis since discharge from the hospital.  Work-up in the emergency department revealed sepsis likely secondary to UTI and pneumonia.  The patient was started on vancomycin and cefepime.  The patient was placed on BiPAP initially and started on Neo-Synephrine for hypotension. BSE requested.  Subjective: "I just want to go home." Assessment / Plan / Recommendation CHL IP CLINICAL IMPRESSIONS 04/09/2018 Clinical Impression Pt presents with functionally severe oropharyngeal dysphagia characterized  by weak lingual manipulation and moderate to severe delays in swallow initiation with swallow trigger after filling the pyriforms and sitting for several seconds, and reduced laryngeal closure resulting in trace to moderate aspiration of nectars and thins during the swallow (between arytenoids and down posterior tracheal wall). Pt generally sensed aspiration of NTL, but was unsuccessful in removing aspirate. Thin liquids were generally silently aspirated and not removed despite a cued cough. Pt with greater pyriform pooling on the LEFT side before the swallow trigger due to left hemiparesis. Strategies and position changes were ineffective as Pt unable to implement. Pt assessed with puree and mech soft textures, however pooling and stasis observed in the pyriforms without a swallow trigger for both textures. Swallow triggered after Pt presented with liquid wash. Pt is at risk for aspiration with all textures and consistencies due to significant delay in swallow initiation, reduced laryngeal closure, poor cough/trunk support, and impulsivity with intake (unable to self regulate small sips). Pt is adamant that she is tired of diet modifications and desires thin liquids despite risk for aspiration. At this point, Pt is at equal risk  for aspiration with thins and nectars. SLP discussed with Pt, Dr. Carles Collet, Palliative NP, dietician, and Pt's sister. Pt interested in pursuing comfort measures and feeds so will be placed on thin liquids and D2/chopped. SLP will follow during acute stay. SLP Visit Diagnosis Dysphagia, oropharyngeal phase (R13.12) Attention and concentration deficit following -- Frontal lobe and executive function deficit following -- Impact on safety and function Severe aspiration risk;Risk for inadequate nutrition/hydration   CHL IP TREATMENT RECOMMENDATION 04/09/2018 Treatment Recommendations Therapy as outlined in treatment plan below   Prognosis 04/09/2018 Prognosis for Safe Diet Advancement Guarded Barriers to Reach Goals Severity of deficits Barriers/Prognosis Comment -- CHL IP DIET RECOMMENDATION 04/09/2018 SLP Diet Recommendations Dysphagia 2 (Fine chop) solids;Thin liquid Liquid Administration via Cup;Spoon Medication Administration Whole meds with puree Compensations Slow rate;Small sips/bites Postural Changes Remain semi-upright after after feeds/meals (Comment);Seated upright at 90 degrees   CHL IP OTHER RECOMMENDATIONS 04/09/2018 Recommended Consults -- Oral Care Recommendations Oral care before and after PO;Staff/trained caregiver to provide oral care Other Recommendations Clarify dietary restrictions   CHL IP FOLLOW UP RECOMMENDATIONS 04/09/2018 Follow up Recommendations Skilled Nursing facility   [facility] IP FREQUENCY AND DURATION 04/09/2018 Speech Therapy Frequency (ACUTE ONLY) min 2x/week Treatment Duration 1 week      CHL IP ORAL PHASE 04/09/2018 Oral Phase Impaired Oral - Pudding Teaspoon -- Oral - Pudding Cup -- Oral - Honey Teaspoon -- Oral - Honey Cup -- Oral - Nectar Teaspoon Weak lingual manipulation;Delayed oral transit Oral - Nectar Cup -- Oral - Nectar Straw -- Oral - Thin Teaspoon Weak lingual manipulation;Delayed oral transit Oral - Thin Cup Weak lingual manipulation;Delayed oral transit Oral - Thin Straw --  Oral - Puree Weak lingual manipulation;Reduced posterior propulsion;Delayed oral transit Oral - Mech Soft Impaired mastication;Weak lingual manipulation;Reduced posterior propulsion;Delayed oral transit Oral - Regular -- Oral - Multi-Consistency -- Oral - Pill -- Oral Phase - Comment --  CHL IP PHARYNGEAL PHASE 04/09/2018 Pharyngeal Phase Impaired Pharyngeal- Pudding Teaspoon -- Pharyngeal -- Pharyngeal- Pudding Cup -- Pharyngeal -- Pharyngeal- Honey Teaspoon -- Pharyngeal -- Pharyngeal- Honey Cup -- Pharyngeal -- Pharyngeal- Nectar Teaspoon Delayed swallow initiation-pyriform sinuses;Reduced airway/laryngeal closure;Penetration/Aspiration during swallow;Trace aspiration;Moderate aspiration Pharyngeal Material enters airway, passes BELOW cords and not ejected out despite cough attempt by patient Pharyngeal- Nectar Cup -- Pharyngeal -- Pharyngeal- Nectar Straw -- Pharyngeal -- Pharyngeal- Thin Teaspoon Delayed swallow initiation-pyriform sinuses;Reduced  airway/laryngeal closure;Penetration/Aspiration before swallow;Trace aspiration Pharyngeal Material enters airway, passes BELOW cords without attempt by patient to eject out (silent aspiration) Pharyngeal- Thin Cup Delayed swallow initiation-pyriform sinuses;Reduced airway/laryngeal closure;Penetration/Aspiration during swallow;Trace aspiration;Moderate aspiration Pharyngeal Material enters airway, passes BELOW cords without attempt by patient to eject out (silent aspiration) Pharyngeal- Thin Straw -- Pharyngeal -- Pharyngeal- Puree Delayed swallow initiation-pyriform sinuses Pharyngeal -- Pharyngeal- Mechanical Soft Delayed swallow initiation-pyriform sinuses Pharyngeal -- Pharyngeal- Regular -- Pharyngeal -- Pharyngeal- Multi-consistency -- Pharyngeal -- Pharyngeal- Pill -- Pharyngeal -- Pharyngeal Comment --  CHL IP CERVICAL ESOPHAGEAL PHASE 04/09/2018 Cervical Esophageal Phase WFL Pudding Teaspoon -- Pudding Cup -- Honey Teaspoon -- Honey Cup -- Nectar Teaspoon --  Nectar Cup -- Nectar Straw -- Thin Teaspoon -- Thin Cup -- Thin Straw -- Puree -- Mechanical Soft -- Regular -- Multi-consistency -- Pill -- Cervical Esophageal Comment -- Thank you, Genene Churn, [place] PORTER,DABNEY 04/09/2018, 9:47 PM               Orson Eva, DO  [facility] Pager [phone]  If 7PM-7AM, please contact night-coverage [email or web address] Password [email or web address] 04/12/2018, 1:11 PM   LOS: 4 days

## 2018-04-13 DIAGNOSIS — I480 Paroxysmal atrial fibrillation: Secondary | ICD-10-CM

## 2018-04-13 DIAGNOSIS — R1312 Dysphagia, oropharyngeal phase: Secondary | ICD-10-CM

## 2018-04-13 DIAGNOSIS — I10 Essential (primary) hypertension: Secondary | ICD-10-CM

## 2018-04-13 DIAGNOSIS — N170 Acute kidney failure with tubular necrosis: Secondary | ICD-10-CM

## 2018-04-13 DIAGNOSIS — N390 Urinary tract infection, site not specified: Secondary | ICD-10-CM

## 2018-04-13 DIAGNOSIS — N183 Chronic kidney disease, stage 3 (moderate): Secondary | ICD-10-CM

## 2018-04-13 LAB — CULTURE, BLOOD (ROUTINE X 2)
Culture: NO GROWTH
Culture: NO GROWTH
Special Requests: ADEQUATE

## 2018-04-13 LAB — CREATININE, SERUM
Creatinine, Ser: 0.83 mg/dL (ref 0.44–1.00)
GFR calc non Af Amer: >60 mL/min (ref >60)

## 2018-04-13 MED ORDER — HALOPERIDOL LACTATE 2 MG/ML PO CONC
0.5000 mg | ORAL | Status: DC | PRN
  Filled 2018-04-13: qty 0.3

## 2018-04-13 MED ORDER — GLYCOPYRROLATE 0.2 MG/ML IJ SOLN: 0.2000 mg | INTRAMUSCULAR | Status: DC | PRN

## 2018-04-13 MED ORDER — GLYCOPYRROLATE 1 MG PO TABS: 1.0000 mg | ORAL_TABLET | ORAL | 0 refills | Status: AC | PRN

## 2018-04-13 MED ORDER — MORPHINE SULFATE (CONCENTRATE) 10 MG/0.5ML PO SOLN: 5.0000 mg | ORAL | 0 refills | Status: AC

## 2018-04-13 MED ORDER — DIPHENHYDRAMINE HCL 50 MG/ML IJ SOLN: 12.5000 mg | INTRAMUSCULAR | Status: DC | PRN

## 2018-04-13 MED ORDER — POLYVINYL ALCOHOL 1.4 % OP SOLN: 1.0000 [drp] | Freq: Four times a day (QID) | OPHTHALMIC | Status: DC | PRN

## 2018-04-13 MED ORDER — HALOPERIDOL LACTATE 5 MG/ML IJ SOLN: 0.5000 mg | INTRAMUSCULAR | Status: DC | PRN

## 2018-04-13 MED ORDER — MAGIC MOUTHWASH: 15.0000 mL | Freq: Four times a day (QID) | ORAL | Status: DC | PRN

## 2018-04-13 MED ORDER — HALOPERIDOL 0.5 MG PO TABS: 0.5000 mg | ORAL_TABLET | ORAL | Status: DC | PRN

## 2018-04-13 MED ORDER — MAGIC MOUTHWASH W/LIDOCAINE: 15.0000 mL | Freq: Four times a day (QID) | ORAL | Status: DC | PRN

## 2018-04-13 MED ORDER — HYDROMORPHONE BOLUS VIA INFUSION: 0.5000 mg | INTRAVENOUS | Status: DC | PRN

## 2018-04-13 MED ORDER — LOPERAMIDE HCL 2 MG PO CAPS: 2.0000 mg | ORAL_CAPSULE | ORAL | Status: DC | PRN

## 2018-04-13 MED ORDER — BISACODYL 10 MG RE SUPP: 10.0000 mg | Freq: Every day | RECTAL | Status: DC | PRN

## 2018-04-13 MED ORDER — GLYCOPYRROLATE 1 MG PO TABS: 1.0000 mg | ORAL_TABLET | ORAL | Status: DC | PRN

## 2018-04-13 MED ORDER — BISACODYL 10 MG RE SUPP: 10.0000 mg | Freq: Every day | RECTAL | 0 refills | Status: AC | PRN

## 2018-04-13 MED ORDER — TRAZODONE HCL 50 MG PO TABS: 25.0000 mg | ORAL_TABLET | Freq: Every evening | ORAL | Status: DC | PRN

## 2018-04-13 MED ORDER — BIOTENE DRY MOUTH MT LIQD: 15.0000 mL | OROMUCOSAL | Status: DC | PRN

## 2018-04-13 MED ORDER — MORPHINE SULFATE (CONCENTRATE) 10 MG/0.5ML PO SOLN
5.0000 mg | ORAL | Status: DC
  Administered 2018-04-13 (×2): 5 mg via ORAL
  Filled 2018-04-13 (×2): qty 0.5

## 2018-04-13 MED ORDER — SODIUM CHLORIDE 0.9 % IV SOLN: 0.2500 mg/h | INTRAVENOUS | Status: DC

## 2018-04-13 MED ORDER — HALOPERIDOL 0.5 MG PO TABS: 0.5000 mg | ORAL_TABLET | ORAL | 0 refills | Status: AC | PRN

## 2018-04-13 MED ORDER — SODIUM CHLORIDE 0.9 % IV SOLN: 12.5000 mg | Freq: Four times a day (QID) | INTRAVENOUS | Status: DC | PRN

## 2018-04-13 NOTE — Discharge Summary (Signed)
Physician Discharge Summary  Brenda Hall CVE:[id number] DOB: Jan 10, 1931 DOA: 04/08/2018  PCP: [name], MD  Admit date: 04/08/2018 Discharge date: 04/13/2018  Time spent: 60 minutes  Recommendations for Outpatient Follow-up:  1. Patient will be discharged to residential hospice home. 2. Follow-up with MD at residential hospice home.   Discharge Diagnoses:  Principal Problem:   Sepsis due to undetermined organism [facility]) Active Problems:   Aspiration pneumonia of both lower lobes due to gastric secretions (HCC)   Essential hypertension   UTI (urinary tract infection)   PAF (paroxysmal atrial fibrillation) (HCC)   Sick sinus syndrome (HCC)   Sepsis ([place])   Acute renal failure superimposed on stage 3 chronic kidney disease ([place])   Septic shock (HCC)   Goals of care, counseling/discussion   Dysphagia   Palliative care by specialist   Advanced care planning/counseling discussion   Pressure injury of skin   Discharge Condition: Stable  Diet recommendation: Comfort feeds  Filed Weights   04/08/18 0029 04/08/18 1050 04/09/18 0500  Weight: 61.8 kg 66.8 kg 67.6 kg    History of present illness:  Per Dr. Darrick Meigs [place]  is a 84 y.o. female, with history of CVA with left hemiparesis, atrial fibrillation on anticoagulation with Eliquis, CKD stage III, polymyalgia rheumatica, essential hypertension, CAD who was residing at [facility] for rehab, after she was discharged from [place] last month. On day of admission, patient's daughter noted that she was having respiratory distress and had a lot of secretions in the lungs which she was unable to cough up. EMS was called and noted decreased mental status was initial O2 sats of 60%.  She was put on CPAP with O2 sat saturating 80%.  She was on BiPAP in the ED. Patient was hypotensive and hypoxic. Given IV fluid boluses and oxygen saturation improved on BiPAP. Patient denied any pain at this time. Breathing was  improved on BiPAP There had been no reported history of nausea vomiting or diarrhea as per family members She had been complaining of intermittent abdominal pain. Patient is bedbound after stroke due to left hemiparesis, she is on soft diet and also has some dysphagia with intermittent choking episodes as per family.  Chest x-ray in the ED showed coarse interstitial and alveolar opacities left midlung and both lung bases which could reflect pulmonary edema or pneumonia. Patient started on IV vancomycin and cefepime  Hospital Course:  Brief History: 84 year old female with a history of coronary disease, CKD stage III, recent right MCA stroke, sick sinus syndrome, proximal atrial fibrillation presenting with respiratory distress from her nursing facility. Apparently, the patient began developing shortness of breath approximately 1 hour prior to arrival to the emergency department. EMS reported decreased mental status with oxygen saturations in the 60% range. The patient arrived in the emergency department on CPAP with saturations 80%. The patient was recently discharged from the hospital after stay from 03/02/2018 through 03/13/2018 for treatment of right MCA stroke as well as paroxysmal atrial fibrillation. The patient has been bedbound with left hemiparesis since discharge from the hospital. Work-up in the emergency department revealed sepsis likely secondary to UTI and pneumonia. The patient was started on vancomycin and cefepime. The patient was placed on BiPAP initially and started on Neo-Synephrine for hypotension.  The patient was subsequently weaned off of Neo-Synephrine.  Although the patient did have some clinical improvement, her overall prognosis continued to be poor.  In addition, the patient has continued to have a worsening functional decline  since her stroke.  Multiple family meetings were undertaken with the patient's son and family.  Goals of care and advance care planning were  discussed.  Ultimately, the son and family agreed that it was in the patient's best interest to transition her focus of care to that of full comfort.  Sepsis -Present at the time of admission -Secondary to UTI and pneumonia -Lactic acid peaked 1.76 -Calcitonin was measured which went from-3.10>>>1.66 -She was initially placed empirically on IV vancomycin and IV cefepime.   -IV vancomycin was subsequently discontinued.   -Patient was subsequently transitioned to IV cefepime to cover for aspiration and concerns for UTI.   -Blood cultures which were obtained were negative to date.  Urine cultures were negative.   -Sepsis physiology improved during the hospitalization.  Patient had initially required Neo-Synephrine due to hypotension which was subsequently weaned off.  Patient's blood pressure remained stable.   -Dr. Edd Arbour had discussions with patient and family as well as palliative care met with family.  Decision was made to transition to full comfort measures.  IV antibiotics will be discontinued on discharge.  Patient will be discharged to residential hospice home.  Healthcare associated pneumonia/aspiration pneumonia -The patient has dysphagia secondary to her stroke and is normally on the thickened diet -On admission patient was noted to be septic with source felt to be secondary to a pneumonia.   -Chest x-ray done showed increased right lower lobe and left lower lobe opacities.  Patient placed empirically on IV Zosyn.  Influenza PCR was negative.   Patient was assessed by speech therapy and modified barium swallow performed which showed continued aspiration with nectar thickened liquids.  -Dr. Carles Collet discussed with the patient and family--they are willing to accept aspiration risk to allow the patient to be able to eat and drink -After multiple family meetings, the patient son and family agreed to transition the patient's focus of care to focus on full comfort.  Patient will be discharged to  residential hospice home.  UTI -Culture unrevealing, but patient presented with clinical syndrome consistent with UTI and sepsis -Concerning for UTI -Patient was empirically placed on IV Zosyn for both healthcare associated pneumonia/aspiration pneumonia and UTI.  -After multiple family meetings, the patient son and family agreed to transition the patient's focus of care to focus on full comfort  Paroxysmal atrial fibrillation--now with RVR -Patient developed A. fib with RVR during the hospitalization.   -EKG which was done showed nonspecific ST-T changes.  Patient's amiodarone and metoprolol were initially held during the hospitalization and subsequently resumed.  Patient was initially placed on a diltiazem drip for better rate control which was subsequently weaned off on the evening of 04/10/2018. Patient was started back on amiodarone and metoprolol dose started and increased back to home regimen of 100 mg twice daily with better rate control.  Patient's anticoagulation was held due to episodes of hematochezia. -After multiple family meetings, the patient son and family agreed to transition the patient's focus of care to focus on full comfort.  Patient will be discharged to residential hospice home.  Acute on chronic renal failure--CKD stage III -Baseline creatinine 1.2-1.5 -Serum creatinine peaked 2.21 -Patient hydrated with IV fluids with improvement in renal function.  Creatinine was down to 0.83 by day of discharge.  Patient now being transitioned to full comfort measures and will be discharged to residential hospice home.   Essential hypertension -Patient's Norvasc was initially held secondary to sepsis and hypotension as well as a beta-blocker.  Patient's blood  pressure improved.  Patient was subsequently weaned off pressors.  Patient's metoprolol was resumed for better rate control.  Blood pressure remained stable.  Patient now being transition to full comfort measures will be  discharged to residential hospice home.   Depression -Restarted patient's home regimen of Zoloft.  Hyperlipidemia -Statin was held during the hospitalization and will not be resumed on discharge as patient has not been transitioned to full comfort measures.   Hematochezia -The patient had bloody bowel movement in the emergency department -Patient's apixaban was held throughout the hospitalization.  -GI consulted and suspected likely diverticular bleed and recommendations was for conservative management.  Patient was noted to have a 2 g drop in the hemoglobin which was felt to be dilutional however hemoglobin remained stable. -Patient had no further episodes of hematochezia noted during the hospitalization. -Patient being transitioned to full comfort measures and as such anticoagulation has been discontinued on discharge.  Hypomagnesemia/hypokalemia -Repleted  Stage 1 sacral ulcer -Noted on admission.  No signs of infection.  Local wound care was done during the hospitalization.    [place] -Patient seen by palliative medicine.  Patient subsequently is a DO NOT RESUSCITATE.  Patient was started on comfort feeds. -Discussions were had with the family by palliative care and per Dr. Loralee Pacas.  Decision was made to discontinue further blood draws, did discontinue nonessential medications, continue antibiotics during the hospitalization with the understanding that this will be discontinued once patient is discharged to residential hospice home.  On 04/12/2018 Dr. Carles Collet discussed with patient son at bedside and decision was made to transition the patient to full comfort measures.  Patient was started on some scheduled morphine.  Patient will be discharged to residential hospice home.    Procedures:  Chest x-ray 04/08/2018,  Modified barium swallow 04/09/2018  Consultations:  Palliative care: Leonette Nutting, NP 04/08/2018  Discharge Exam: Vitals:   04/12/18 1545 04/13/18 0912  BP: (!)  144/82   Pulse: 90   Resp: 20   Temp: (!) 97.3 F (36.3 C)   SpO2: 94% 97%    General: Alert.  Family at bedside. Cardiovascular: Irregularly irregular Respiratory: Coarse breath sounds anterior lung fields.  Some bibasilar crackles.  Discharge Instructions   Discharge Instructions    Diet general   Complete by:  As directed    Increase activity slowly   Complete by:  As directed      Allergies as of 04/13/2018      Reactions   Epinephrine Other (See Comments)   During dental procedure-caused facial swelling at injection site. Pt has had epinephrine since then for nose and facial surgery and did fine with it per pt.   Clopidogrel Bisulfate Itching, Rash   Plavix   Oxycodone    Hallucinations. Pt states that she can tolerate hydrocodone      Medication List    STOP taking these medications   Abaloparatide 3120 MCG/1.56ML Sopn Commonly known as:  TYMLOS   amLODipine 5 MG tablet Commonly known as:  NORVASC   apixaban 5 MG Tabs tablet Commonly known as:  ELIQUIS   atorvastatin 80 MG tablet Commonly known as:  LIPITOR   CALCIUM 1200 PO   estrogens (conjugated) 0.3 MG tablet Commonly known as:  PREMARIN   nitroGLYCERIN 0.4 MG SL tablet Commonly known as:  NITROSTAT   RESOURCE THICKENUP CLEAR Powd   traMADol 50 MG tablet Commonly known as:  ULTRAM   Zinc 50 MG Caps     TAKE these medications   acetaminophen  500 MG tablet Commonly known as:  TYLENOL Take 500 mg by mouth every 6 (six) hours as needed for mild pain.   amiodarone 200 MG tablet Commonly known as:  PACERONE Take 200 mg by mouth daily.   bisacodyl 10 MG suppository Commonly known as:  DULCOLAX Place 1 suppository (10 mg total) rectally daily as needed for moderate constipation.   fluticasone 50 MCG/ACT nasal spray Commonly known as:  FLONASE Place 2 sprays into both nostrils daily.   gabapentin 100 MG capsule Commonly known as:  NEURONTIN Take 100 mg by mouth 3 (three) times  daily.   glycopyrrolate 1 MG tablet Commonly known as:  ROBINUL Take 1 tablet (1 mg total) by mouth every 4 (four) hours as needed (excessive secretions).   haloperidol 0.5 MG tablet Commonly known as:  HALDOL Take 1 tablet (0.5 mg total) by mouth every 4 (four) hours as needed for agitation (or delirium).   hydrocortisone cream 1 % Apply sparingly to rectal once a day prn   loperamide 2 MG capsule Commonly known as:  IMODIUM Take 2 mg by mouth daily as needed for diarrhea or loose stools.   metoprolol tartrate 100 MG tablet Commonly known as:  LOPRESSOR Take 1 tablet (100 mg total) by mouth 2 (two) times daily.   morphine CONCENTRATE 10 MG/0.5ML Soln concentrated solution Take 0.25 mLs (5 mg total) by mouth every 4 (four) hours.   pantoprazole 40 MG tablet Commonly known as:  PROTONIX Take 40 mg by mouth daily.   sertraline 50 MG tablet Commonly known as:  ZOLOFT Take 50 mg by mouth daily.      Allergies  Allergen Reactions  . Epinephrine Other (See Comments)    During dental procedure-caused facial swelling at injection site. Pt has had epinephrine since then for nose and facial surgery and did fine with it per pt.  . Clopidogrel Bisulfate Itching and Rash    Plavix  . Oxycodone     Hallucinations. Pt states that she can tolerate hydrocodone   Follow-up Information    MD at residential hospice home Follow up.            The results of significant diagnostics from this hospitalization (including imaging, microbiology, ancillary and laboratory) are listed below for reference.    Significant Diagnostic Studies: Dg Chest Port 1 View  Result Date: 04/08/2018 CLINICAL DATA:  Shortness of breath. EXAM: PORTABLE CHEST 1 VIEW COMPARISON:  Chest x-ray dated March 08, 2018. FINDINGS: Stable cardiomediastinal silhouette. Coarse interstitial and alveolar opacities in the left mid lung and both lung bases. No pleural effusion or pneumothorax. No acute osseous  abnormality. IMPRESSION: 1. Coarse interstitial and alveolar opacities left mid lung and both lung bases could reflect pulmonary edema or pneumonia. Electronically Signed   By: Titus Dubin M.D.   On: 04/08/2018 00:55   Dg Swallowing Func-speech Pathology  Result Date: 04/09/2018 Objective Swallowing Evaluation: Type of Study: MBS-Modified Barium Swallow Study  Patient Details Name: Brenda Hall MRN: [id number] Date of Birth: 1930-07-18 Today's Date: 04/09/2018 Time: SLP Start Time (ACUTE ONLY): 1116 -SLP Stop Time (ACUTE ONLY): 1200 SLP Time Calculation (min) (ACUTE ONLY): 44 min Past Medical History: Past Medical History: Diagnosis Date . Anemia  . CAD in native artery   a. NSTEMI 12/2005 s/p BMS to Cx, prior normal LVEF. Marland Kitchen Cancer ([place]) 1988 and  2009  BCC nose and  Melanoma back . Cancer of skin Jan. 2014  [facility] Forehead and Nose . Carotid stenosis  mild . Chronic back pain  . CKD (chronic kidney disease), stage III ([place])  . Coronary atherosclerosis of unspecified type of vessel, native or graft  . DJD (degenerative joint disease) of knee  . History of TIA (transient ischemic attack)  . HTN (hypertension)  . IBS (irritable bowel syndrome)  . Myocardial infarction [facility])   5 yrs ago     dr wall  cardiac . Osteoporosis  . Other diseases of lung, not elsewhere classified   lung nodule . Polymyalgia rheumatica ([place])  . Shingles 01/2016-02/2016 . Stroke [facility])  Past Surgical History: Past Surgical History: Procedure Laterality Date . ABDOMINAL HYSTERECTOMY  1977  partial . BACK SURGERY  1982 . CARDIAC CATHETERIZATION   . COLONOSCOPY N/A 01/27/2013  Procedure: COLONOSCOPY;  Surgeon: Rogene Houston, MD;  Location[facility]O [facility];  Service: Endoscopy;  Laterality: N/A;  1030 . EYE SURGERY Bilateral 04/2017 . EYE SURGERY Right 09/23/2017 . IR KYPHO LUMBAR INC FX REDUCE BONE BX UNI/BIL CANNULATION INC/IMAGING  09/30/2017 . IR VERTEBROPLASTY CERV/THOR BX INC UNI/BIL INC/INJECT/IMAGING  09/05/2017 . JOINT REPLACEMENT   2001 and  2011  6 knee surgeries   bil replacement . KNEE ARTHROPLASTY   . KNEE ARTHROSCOPY   . LUMBAR LAMINECTOMY   . PR VEIN BYPASS GRAFT,AORTO-FEM-POP  2010 . removal of melanoma from back  2007 . SPINE SURGERY  2013  Broken back: fusion surgery . tonisllectomy  1944 . total knee anthroplasty   . VERTEBROPLASTY  07/11/2011  Procedure: VERTEBROPLASTY;  Surgeon: Faythe Ghee, MD;  Location: [place] NEURO ORS;  Service: Neurosurgery;  Laterality: N/A;  Vertebroplasty of Lumbar Two HPI: 84 year old female with a history of coronary disease, CKD stage III, recent right MCA stroke, sick sinus syndrome, proximal atrial fibrillation presenting with respiratory distress from her nursing facility.  Apparently, the patient began developing shortness of breath approximately 1 hour prior to arrival to the emergency department.  EMS reported decreased mental status with oxygen saturations in the 60% range.  The patient arrived in the emergency department on CPAP with saturations 80%.  The patient was recently discharged from the hospital after stay from 03/02/2018 through 03/13/2018 for treatment of right MCA stroke as well as paroxysmal atrial fibrillation.  The patient has been bedbound with left hemiparesis since discharge from the hospital.  Work-up in the emergency department revealed sepsis likely secondary to UTI and pneumonia.  The patient was started on vancomycin and cefepime.  The patient was placed on BiPAP initially and started on Neo-Synephrine for hypotension. BSE requested.  Subjective: "I just want to go home." Assessment / Plan / Recommendation CHL IP CLINICAL IMPRESSIONS 04/09/2018 Clinical Impression Pt presents with functionally severe oropharyngeal dysphagia characterized by weak lingual manipulation and moderate to severe delays in swallow initiation with swallow trigger after filling the pyriforms and sitting for several seconds, and reduced laryngeal closure resulting in trace to moderate aspiration of  nectars and thins during the swallow (between arytenoids and down posterior tracheal wall). Pt generally sensed aspiration of NTL, but was unsuccessful in removing aspirate. Thin liquids were generally silently aspirated and not removed despite a cued cough. Pt with greater pyriform pooling on the LEFT side before the swallow trigger due to left hemiparesis. Strategies and position changes were ineffective as Pt unable to implement. Pt assessed with puree and mech soft textures, however pooling and stasis observed in the pyriforms without a swallow trigger for both textures. Swallow triggered after Pt presented with liquid wash. Pt is at risk for  aspiration with all textures and consistencies due to significant delay in swallow initiation, reduced laryngeal closure, poor cough/trunk support, and impulsivity with intake (unable to self regulate small sips). Pt is adamant that she is tired of diet modifications and desires thin liquids despite risk for aspiration. At this point, Pt is at equal risk for aspiration with thins and nectars. SLP discussed with Pt, Dr. Carles Collet, Palliative NP, dietician, and Pt's sister. Pt interested in pursuing comfort measures and feeds so will be placed on thin liquids and D2/chopped. SLP will follow during acute stay. SLP Visit Diagnosis Dysphagia, oropharyngeal phase (R13.12) Attention and concentration deficit following -- Frontal lobe and executive function deficit following -- Impact on safety and function Severe aspiration risk;Risk for inadequate nutrition/hydration   CHL IP TREATMENT RECOMMENDATION 04/09/2018 Treatment Recommendations Therapy as outlined in treatment plan below   Prognosis 04/09/2018 Prognosis for Safe Diet Advancement Guarded Barriers to Reach Goals Severity of deficits Barriers/Prognosis Comment -- CHL IP DIET RECOMMENDATION 04/09/2018 SLP Diet Recommendations Dysphagia 2 (Fine chop) solids;Thin liquid Liquid Administration via Cup;Spoon Medication Administration  Whole meds with puree Compensations Slow rate;Small sips/bites Postural Changes Remain semi-upright after after feeds/meals (Comment);Seated upright at 90 degrees   CHL IP OTHER RECOMMENDATIONS 04/09/2018 Recommended Consults -- Oral Care Recommendations Oral care before and after PO;Staff/trained caregiver to provide oral care Other Recommendations Clarify dietary restrictions   CHL IP FOLLOW UP RECOMMENDATIONS 04/09/2018 Follow up Recommendations Skilled Nursing facility   [facility] IP FREQUENCY AND DURATION 04/09/2018 Speech Therapy Frequency (ACUTE ONLY) min 2x/week Treatment Duration 1 week      CHL IP ORAL PHASE 04/09/2018 Oral Phase Impaired Oral - Pudding Teaspoon -- Oral - Pudding Cup -- Oral - Honey Teaspoon -- Oral - Honey Cup -- Oral - Nectar Teaspoon Weak lingual manipulation;Delayed oral transit Oral - Nectar Cup -- Oral - Nectar Straw -- Oral - Thin Teaspoon Weak lingual manipulation;Delayed oral transit Oral - Thin Cup Weak lingual manipulation;Delayed oral transit Oral - Thin Straw -- Oral - Puree Weak lingual manipulation;Reduced posterior propulsion;Delayed oral transit Oral - Mech Soft Impaired mastication;Weak lingual manipulation;Reduced posterior propulsion;Delayed oral transit Oral - Regular -- Oral - Multi-Consistency -- Oral - Pill -- Oral Phase - Comment --  CHL IP PHARYNGEAL PHASE 04/09/2018 Pharyngeal Phase Impaired Pharyngeal- Pudding Teaspoon -- Pharyngeal -- Pharyngeal- Pudding Cup -- Pharyngeal -- Pharyngeal- Honey Teaspoon -- Pharyngeal -- Pharyngeal- Honey Cup -- Pharyngeal -- Pharyngeal- Nectar Teaspoon Delayed swallow initiation-pyriform sinuses;Reduced airway/laryngeal closure;Penetration/Aspiration during swallow;Trace aspiration;Moderate aspiration Pharyngeal Material enters airway, passes BELOW cords and not ejected out despite cough attempt by patient Pharyngeal- Nectar Cup -- Pharyngeal -- Pharyngeal- Nectar Straw -- Pharyngeal -- Pharyngeal- Thin Teaspoon Delayed swallow  initiation-pyriform sinuses;Reduced airway/laryngeal closure;Penetration/Aspiration before swallow;Trace aspiration Pharyngeal Material enters airway, passes BELOW cords without attempt by patient to eject out (silent aspiration) Pharyngeal- Thin Cup Delayed swallow initiation-pyriform sinuses;Reduced airway/laryngeal closure;Penetration/Aspiration during swallow;Trace aspiration;Moderate aspiration Pharyngeal Material enters airway, passes BELOW cords without attempt by patient to eject out (silent aspiration) Pharyngeal- Thin Straw -- Pharyngeal -- Pharyngeal- Puree Delayed swallow initiation-pyriform sinuses Pharyngeal -- Pharyngeal- Mechanical Soft Delayed swallow initiation-pyriform sinuses Pharyngeal -- Pharyngeal- Regular -- Pharyngeal -- Pharyngeal- Multi-consistency -- Pharyngeal -- Pharyngeal- Pill -- Pharyngeal -- Pharyngeal Comment --  CHL IP CERVICAL ESOPHAGEAL PHASE 04/09/2018 Cervical Esophageal Phase WFL Pudding Teaspoon -- Pudding Cup -- Honey Teaspoon -- Honey Cup -- Nectar Teaspoon -- Nectar Cup -- Nectar Straw -- Thin Teaspoon -- Thin Cup -- Thin Straw -- Puree -- Mechanical Soft --  Regular -- Multi-consistency -- Pill -- Cervical Esophageal Comment -- Thank you, Genene Churn, [place] 04/09/2018, 9:47 PM               Microbiology: Recent Results (from the past 240 hour(s))  Urine culture     Status: Abnormal   Collection Time: 04/08/18 12:07 AM  Result Value Ref Range Status   Specimen Description   Final    URINE, CLEAN CATCH Performed at [facility], [address], [place], [place] [address]    Special Requests   Final    NONE Performed at [facility], [address] [place], [place] [address]    Culture MULTIPLE SPECIES PRESENT, SUGGEST RECOLLECTION (A)  Final   Report Status 04/10/2018 FINAL  Final  Blood Culture (routine x 2)     Status: None   Collection Time: 04/08/18 12:32 AM  Result Value Ref Range Status   Specimen Description BLOOD  LEFT ANTECUBITAL  Final   Special Requests   Final    BOTTLES DRAWN AEROBIC AND ANAEROBIC Blood Culture adequate volume   Culture   Final    NO GROWTH 5 DAYS Performed at [facility], [address], [place], [place] [address]    Report Status 04/13/2018 FINAL  Final  Blood Culture (routine x 2)     Status: None   Collection Time: 04/08/18 12:39 AM  Result Value Ref Range Status   Specimen Description BLOOD LEFT HAND  Final   Special Requests   Final    AEROBIC BOTTLE ONLY Blood Culture results may not be optimal due to an inadequate volume of blood received in culture bottles   Culture   Final    NO GROWTH 5 DAYS Performed at [facility], [address], [place], [place] [address]    Report Status 04/13/2018 FINAL  Final  MRSA PCR Screening     Status: None   Collection Time: 04/08/18  8:10 AM  Result Value Ref Range Status   MRSA by PCR NEGATIVE NEGATIVE Final    Comment:        The GeneXpert MRSA Assay (FDA approved for NASAL specimens only), is one component of a comprehensive MRSA colonization surveillance program. It is not intended to diagnose MRSA infection nor to guide or monitor treatment for MRSA infections. Performed at [facility], [address] [place], [place] [address]      Labs: Basic Metabolic Panel: Recent Labs  Lab 04/08/18 0007 04/08/18 0013 04/08/18 0431 04/09/18 0421 04/10/18 0422 04/11/18 0412 04/13/18 0528  NA 143 145 144 148* 142 143  --   K 3.9 4.0 3.5 3.2* 3.5 3.7  --   CL 108 108 117* 121* 115* 115*  --   CO2 24  --  20* 21* 21* 22  --   GLUCOSE 127* 124* 154* 95 95 92  --   BUN 58* 48* 50* 39* 25* 19  --   CREATININE 2.21* 2.30* 1.86* 1.29* 0.97 0.87 0.83  CALCIUM 8.5*  --  7.2* 7.6* 8.2* 8.5*  --   MG  --   --   --  1.5* 1.8 1.9  --    Liver Function Tests: Recent Labs  Lab 04/08/18 0007 04/08/18 0431 04/09/18 0421  AST 71* 51* 48*  ALT 39 30 31  ALKPHOS 85 66 71  BILITOT 0.7 0.7 0.5  PROT 7.0 5.6* 5.5*  ALBUMIN  2.3* 1.9* 1.7*   No results for input(s): LIPASE, AMYLASE in the last 168 hours.  No results for input(s): AMMONIA in the last 168 hours. CBC: Recent Labs  Lab 04/08/18 0007 04/08/18 0013 04/08/18 0431 04/09/18 0421 04/10/18 0422 04/11/18 0412  WBC 9.6  --  15.3* 13.2* 14.1* 10.0  NEUTROABS 8.6*  --   --   --   --   --   HGB 11.9* 12.9 9.3* 9.2* 9.3* 9.6*  HCT 40.4 38.0 32.1* 31.4* 31.6* 32.7*  MCV 87.8  --  89.7 90.2 89.0 89.1  PLT 475*  --  401* 380 416* 432*   Cardiac Enzymes: No results for input(s): CKTOTAL, CKMB, CKMBINDEX, TROPONINI in the last 168 hours. BNP: BNP (last 3 results) No results for input(s): BNP in the last 8760 hours.  ProBNP (last 3 results) No results for input(s): PROBNP in the last 8760 hours.  CBG: No results for input(s): GLUCAP in the last 168 hours.     Signed:  Irine Seal MD.  [facility] 04/13/2018, 1:40 PM

## 2018-04-13 NOTE — Progress Notes (Signed)
Daily Progress Note   Patient Name: Brenda Hall       Date: 04/13/2018 DOB: 1930-12-31  Age: 84 y.o. MRN#: [id number] Attending Physician: Eugenie Filler, MD Primary Care Physician: [name], MD Admit Date: 04/08/2018  Reason for Consultation/Follow-up: Non pain symptom management, Pain control, Psychosocial/spiritual support and Terminal Care  Subjective: Patient resting but facial expression indicates she is somewhat uncomfortable.  She opens her eyes briefly but does not speak.  Heart rate is very irregular and rapid.    Discussed with bedside RN.  Talked with son Shanon Brow on the phone.  Hospice has a bed for her today, and he is signing papers so she can transfer.   Assessment: Patient on full comfort measures.  Does not interact much.  Appears somewhat uncomfortable  Patient Profile/HPI:  84 y.o. female  with past medical history of CVA with left side hemiplegia, CKD III, admitted on 04/08/2018 with respiratory distress. Workup revealed septic shock- requiring IV pressors, Bipap (off pressors now and on nasal cannula)- likely related to pnuemonia (likely secondary to aspiration) and UTI. SLP eval showed patient with dysphagia and moderate aspiration risk. Palliative medicine consulted for [place].   Length of Stay: 5  Current Medications: Scheduled Meds:  . mouth rinse  15 mL Mouth Rinse BID  . morphine  5 mg Oral Q4H    Continuous Infusions: . sodium chloride Stopped (04/08/18 0159)  . chlorproMAZINE (THORAZINE) IV      PRN Meds: acetaminophen **OR** acetaminophen, antiseptic oral rinse, bisacodyl, chlorproMAZINE (THORAZINE) IV, diphenhydrAMINE, glycopyrrolate **OR** glycopyrrolate **OR** glycopyrrolate, haloperidol **OR** haloperidol **OR** haloperidol  lactate, loperamide, LORazepam, magic mouthwash, morphine injection, ondansetron **OR** ondansetron (ZOFRAN) IV, polyvinyl alcohol  Physical Exam        Thin elderly female, lying in bed brow furrowed.  She opens her eyes to voice, but drifts quickly back to sleep. CV irreg irreg and rapid. CV no distress on 6L Abdomen soft, nt, nd  Vital Signs: BP (!) 144/82 (BP Location: Left Arm)   Pulse 90   Temp (!) 97.3 F (36.3 C) (Oral)   Resp 20   Ht 5\' 4"  (1.626 m)   Wt 67.6 kg   SpO2 97%   BMI 25.58 kg/m  SpO2: SpO2: 97 % O2 Device: O2 Device: Nasal Cannula O2 Flow Rate: O2 Flow  Rate (L/min): 6 L/min  Intake/output summary:   Intake/Output Summary (Last 24 hours) at 04/13/2018 1104 Last data filed at 04/12/2018 2300 Gross per 24 hour  Intake -  Output 900 ml  Net -900 ml   LBM: Last BM Date: 04/12/18 Baseline Weight: Weight: 61.8 kg Most recent weight: Weight: 67.6 kg       Palliative Assessment/Data:  10%    Flowsheet Rows     Most Recent Value  Intake Tab  Referral Department  -- [Dietitian]  Unit at Time of Referral  ER  Palliative Care Primary Diagnosis  Neurology  Date Notified  04/08/18  Palliative Care Type  New Palliative care  Reason for referral  Clarify Goals of Care  Date of Admission  04/08/18  Date first seen by Palliative Care  04/08/18  # of days Palliative referral response time  0 Day(s)  # of days IP prior to Palliative referral  0  Clinical Assessment  Psychosocial & Spiritual Assessment  Palliative Care Outcomes      Patient Active Problem List   Diagnosis Date Noted  . Pressure injury of skin 04/11/2018  . Septic shock ([place])   . Goals of care, counseling/discussion   . Dysphagia   . Palliative care by specialist   . Advanced care planning/counseling discussion   . Sepsis ([place]) 04/08/2018  . Aspiration pneumonia of both lower lobes due to gastric secretions ([place]) 04/08/2018  . Sepsis due to undetermined organism ([place]) 04/08/2018  .  Acute renal failure superimposed on stage 3 chronic kidney disease ([place]) 04/08/2018  . Acute respiratory failure with hypoxia ([place])   . Sick sinus syndrome ([place])   . Cerebrovascular accident (CVA) due to occlusion of right middle cerebral artery ([place]) 03/02/2018  . Atrial fibrillation with RVR ([place]) 10/24/2017  . Nausea 10/24/2017  . Acute bilateral low back pain without sciatica   . E. coli UTI   . Community acquired pneumonia   . L1 vertebral fracture ([place]) 09/27/2017  . UTI (urinary tract infection) 09/27/2017  . CKD (chronic kidney disease), stage III ([place])   . History of TIA (transient ischemic attack)   . PAF (paroxysmal atrial fibrillation) ([place])   . Atrial fibrillation ([place]) 02/12/2017  . Pulmonary edema 02/12/2017  . Urgency of micturition 02/12/2017  . Osteopenia of multiple sites 07/23/2016  . Age-related osteoporosis without current pathological fracture 07/11/2016  . History of total knee replacement, bilateral 07/11/2016  . History of COPD 07/11/2016  . Spondylosis of lumbar region without myelopathy or radiculopathy 07/11/2016  . Primary osteoarthritis of both hands 07/11/2016  . Primary osteoarthritis of both feet 07/11/2016  . Vitamin D deficiency 07/11/2016  . History of vertebral fracture 07/11/2016  . History of IBS 07/11/2016  . Basal cell carcinoma 07/11/2016  . History of CVA (cerebrovascular accident)/ has metal implant not MRI safe  07/11/2016  . Other and unspecified hyperlipidemia 11/19/2012  . Cerebrovascular accident (stroke) ([place]) 11/19/2012  . Difficulty in walking(719.7) 11/06/2012  . Occlusion and stenosis of carotid artery without mention of cerebral infarction 02/07/2012  . OLD MYOCARDIAL INFARCTION 11/07/2009  . CAD, NATIVE VESSEL 11/07/2009  . Essential hypertension 09/28/2008  . IRRITABLE BOWEL SYNDROME 09/28/2008  . DEGENERATIVE JOINT DISEASE, KNEE 09/28/2008  . Polymyalgia rheumatica ([place]) 09/28/2008  . Chest pain, unspecified 09/28/2008      Palliative Care Plan    Recommendations/Plan:  Will start scheduled morphine concentrate SL in addition to PRN.  Patient to transfer to [facility] today.  Goals of Care  and Additional Recommendations:  Limitations on Scope of Treatment: Full Comfort Care  Code Status:  DNR  Prognosis: days  Discharge Planning:  Hospice facility  Care plan was discussed with son and Dr. Grandville Silos.  Thank you for allowing the Palliative Medicine Team to assist in the care of this patient.  Total time spent:  35 min.     Greater than 50%  of this time was spent counseling and coordinating care related to the above assessment and plan.  Florentina Jenny, PA-C Palliative Medicine  Please contact Palliative MedicineTeam phone at [phone] for questions and concerns between 7 am - 7 pm.   Please see AMION for individual provider pager numbers.

## 2018-04-13 NOTE — Progress Notes (Signed)
Patient transferred from floor by EMS to Hospice, son called and informed

## 2018-04-13 NOTE — Progress Notes (Signed)
Report called to [facility]

## 2018-04-13 NOTE — Clinical Social Work Note (Signed)
Patient's son notified of discharge.  EMS transport arranged.  Discharge summary sent to [facility].  LCSW signing off.    Nya Monds, Clydene Pugh, LCSW

## 2018-04-22 DEATH — deceased

## 2018-05-01 ENCOUNTER — Ambulatory Visit: Payer: Medicare Other | Admitting: Cardiology

## 2019-11-04 IMAGING — CT CT HEAD CODE STROKE
3 series · 15 of 47 positions shown, 18 images · non-contrast
Comparison: None.

CLINICAL DATA: Code stroke.  Left-sided weakness

EXAM:
CT HEAD WITHOUT CONTRAST
TECHNIQUE: Contiguous axial images were obtained from the base of the skull
through the vertex without intravenous contrast.

[Series 3: head code stroke wo · axial · 0.41mm/px · z∈[+1564,+1704]mm · 9 of 34 slices shown, 12 images]
[im 3/34  brain]
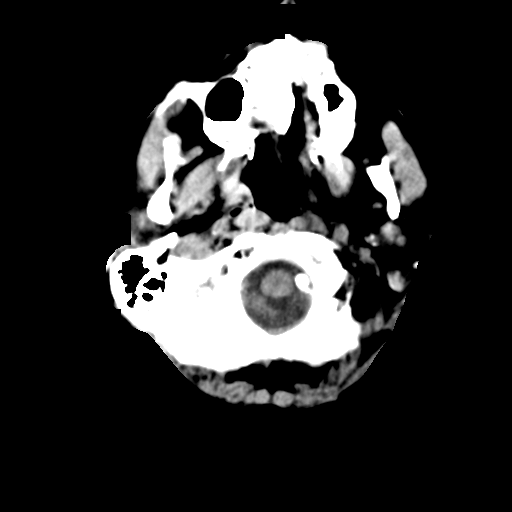
[im 3/34  bone]
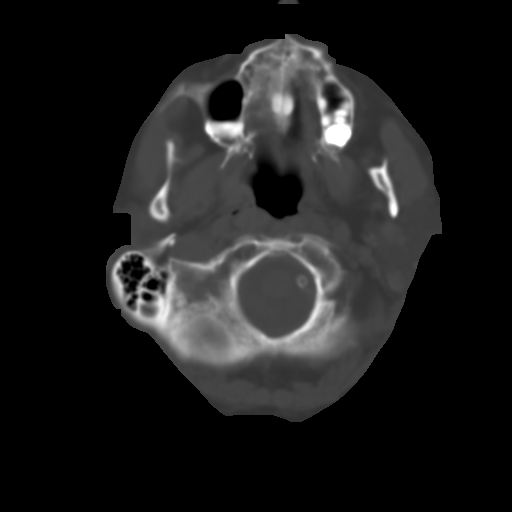
[im 6/34  brain]
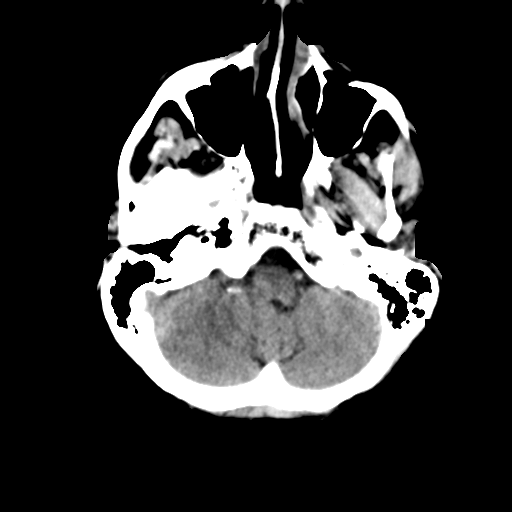
[im 10/34  brain]
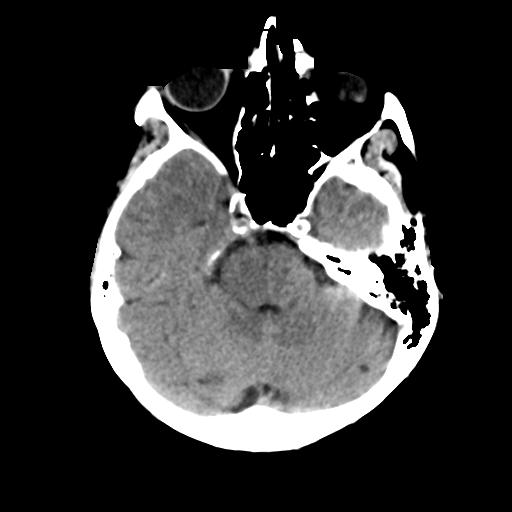
[im 13/34  brain]
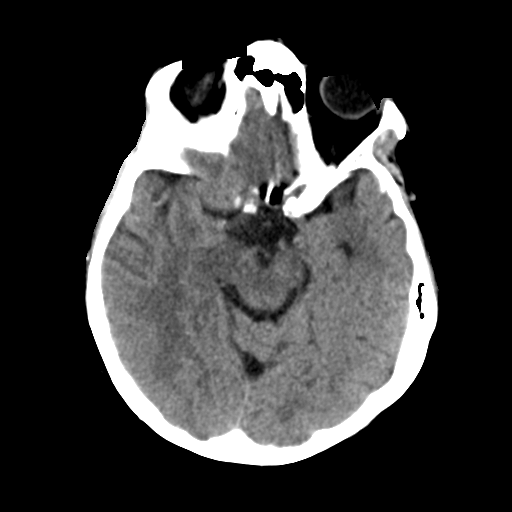
[im 18/34  brain]
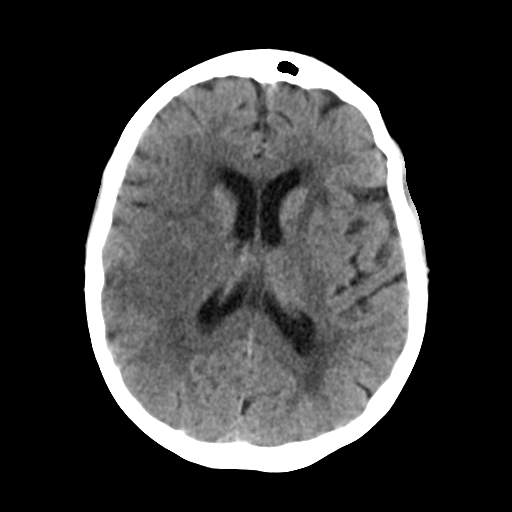
[im 18/34  bone]
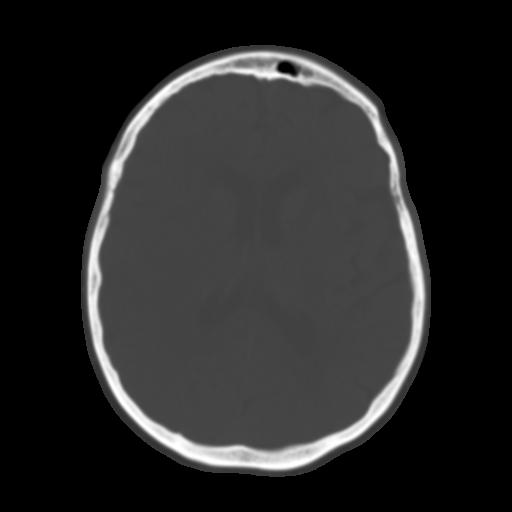
[im 21/34  brain]
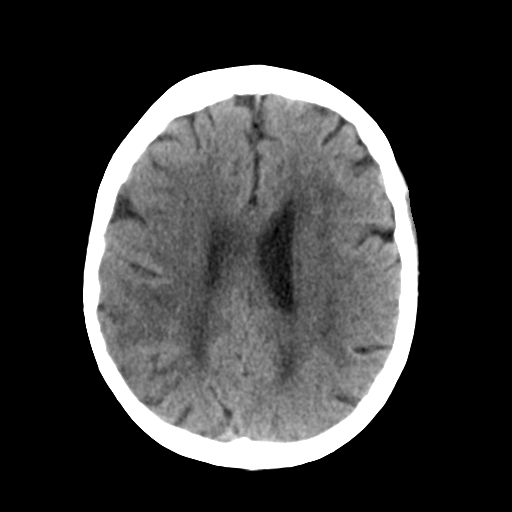
[im 24/34  brain]
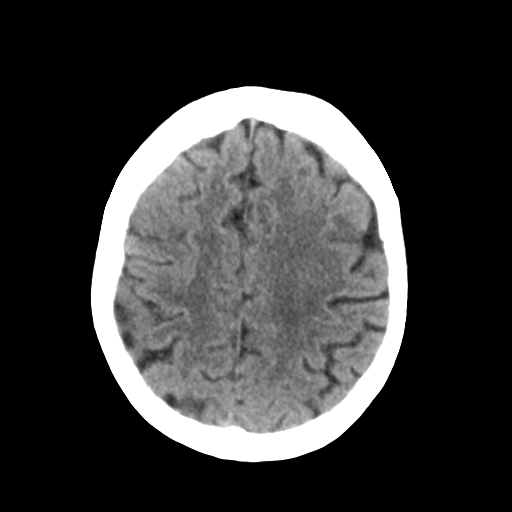
[im 28/34  brain]
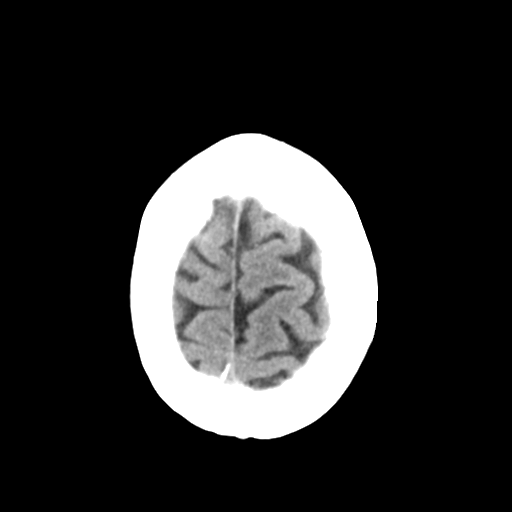
[im 31/34  brain]
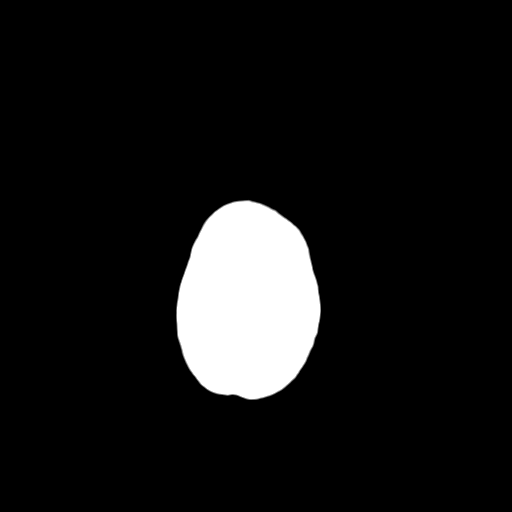
[im 31/34  bone]
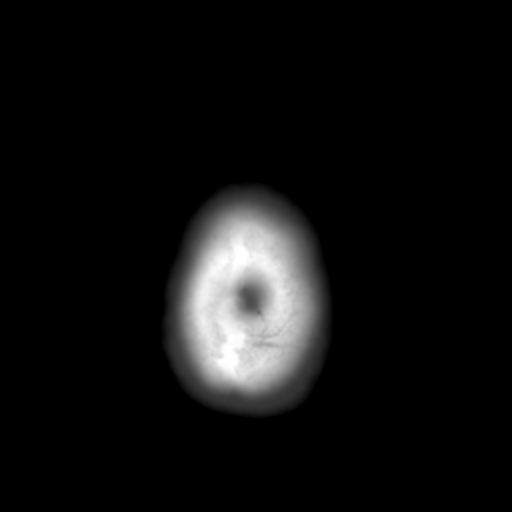

[Series 4: coronal soft tissue · coronal · 0.33mm/px · 3 of 67 slices shown]
[im 23/67  brain]
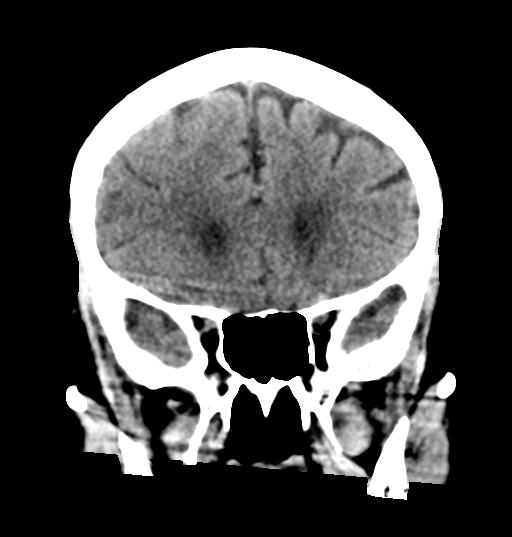
[im 30/67  brain]
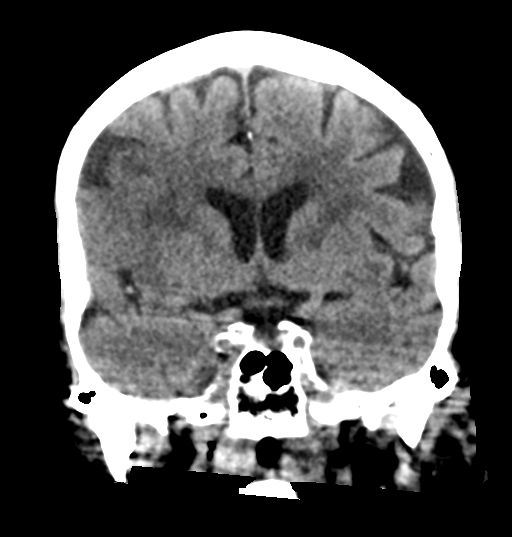
[im 37/67  brain]
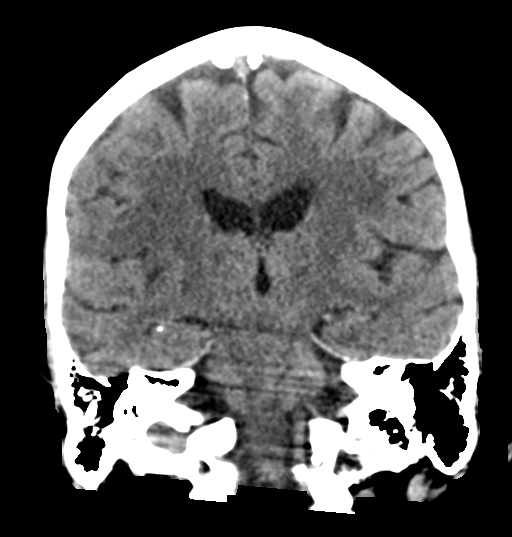

[Series 5: sagittal soft tissue · sagittal · 0.35mm/px · 3 of 55 slices shown]
[im 19/55  brain]
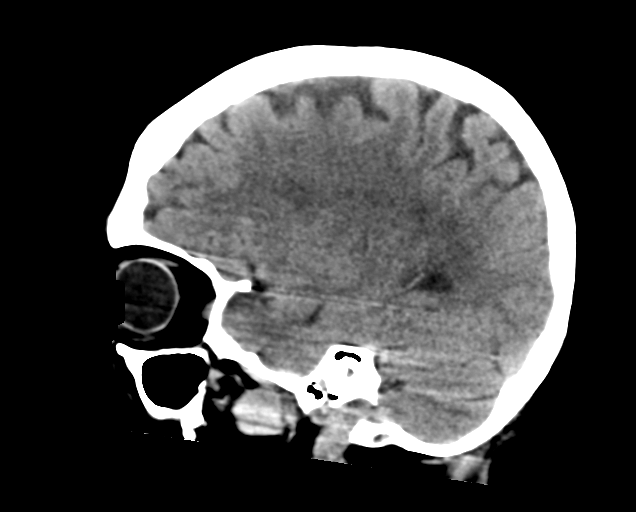
[im 28/55  brain]
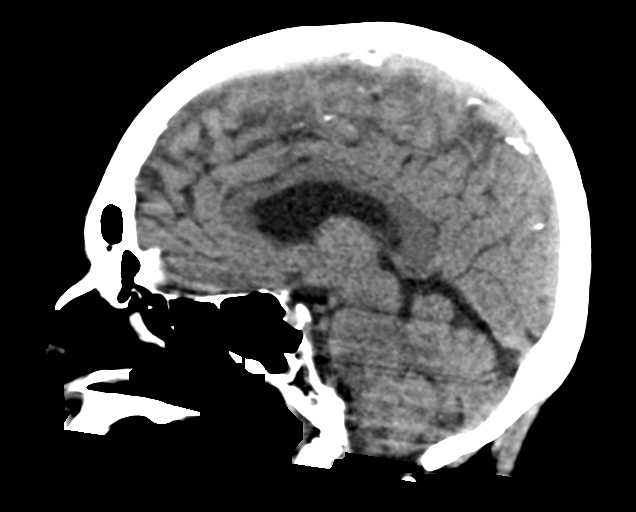
[im 37/55  brain]
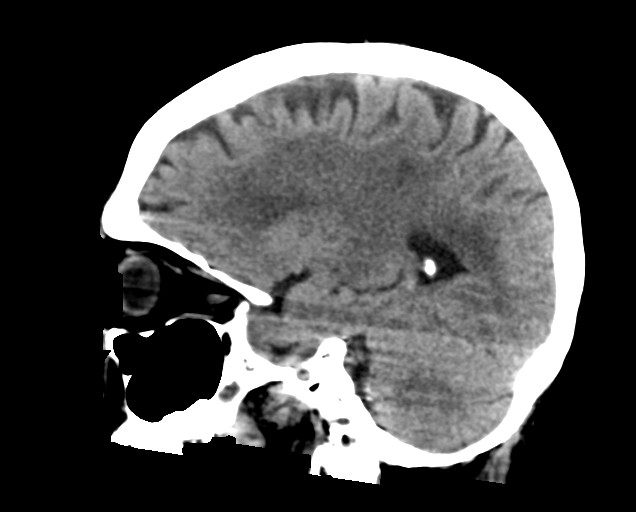

[15 of 47 positions shown; findings below may reference images not displayed]

FINDINGS: Brain: There is no mass, hemorrhage or extra-axial collection. The
size and configuration of the ventricles and extra-axial CSF spaces
are normal. There is hypoattenuation within the posterior right MCA
territory suggesting acute or early subacute infarct. No midline
shift or other mass effect. There are old bilateral cerebellar
infarcts and an old lacunar infarct of the right thalamus.

Vascular: Mild hyperattenuation within the right MCA M2 segment.
Bilateral distal ICA atherosclerotic calcification..

Skull: The visualized skull base, calvarium and extracranial soft
tissues are normal.

Sinuses/Orbits: No fluid levels or advanced mucosal thickening of
the visualized paranasal sinuses. No mastoid or middle ear effusion.
The orbits are normal.

ASPECTS ([place] Stroke Program Early CT Score)

- Ganglionic level infarction (caudate, lentiform nuclei, internal
capsule, insula, M1-M3 cortex): 5

- Supraganglionic infarction (M4-M6 cortex): 1

Total score (0-10 with 10 being normal): 6
IMPRESSION: 1. No acute hemorrhage.
2. Hypoattenuation in the posterior right MCA territory consistent
with acute or early subacute infarct.
3. ASPECTS is 6.

These results were called by telephone at the time of interpretation
on 03/02/2018 at [DATE] to Dr. KIRI JIM , who verbally
acknowledged these results.

## 2019-11-10 IMAGING — DX DG CHEST 1V PORT
1 series · 1 of 1 positions shown · non-contrast
Comparison: 10/24/17

CLINICAL DATA: Developing cough

EXAM:
PORTABLE CHEST 1 VIEW

[chest]
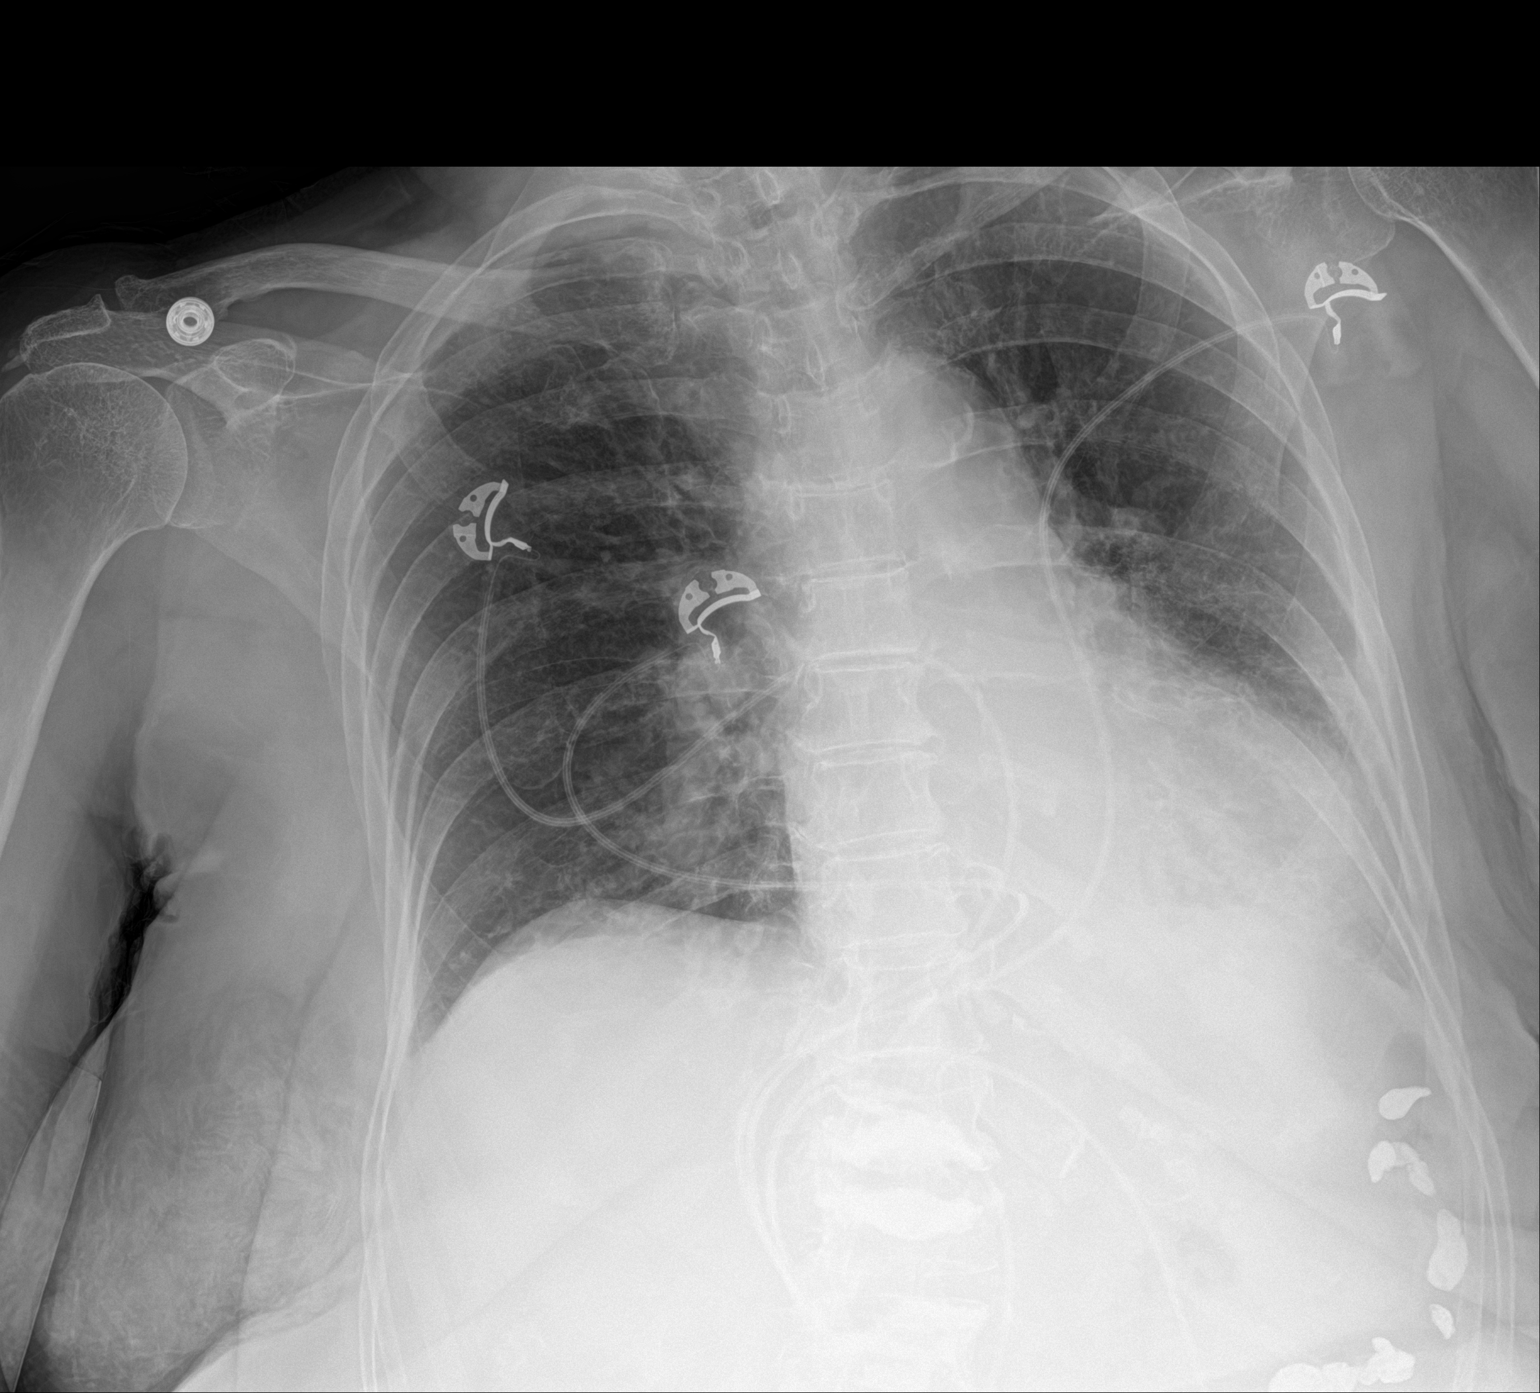

[1 of 1 positions shown; findings below may reference images not displayed]

FINDINGS: Cardiac shadow is within normal limits. Aortic calcifications are
again seen. The lungs are well aerated bilaterally. Minimal left
basilar atelectasis is seen. No significant pulmonary edema is
noted. No bony abnormality is seen.
IMPRESSION: Mild left basilar atelectasis.

## 2020-04-05 ENCOUNTER — Encounter (HOSPITAL_COMMUNITY): Payer: Medicare Other
